# Patient Record
Sex: Male | Born: 1935 | Race: Black or African American | Hispanic: No | Marital: Married | Smoking: Former smoker
Health system: Southern US, Community
[De-identification: ages and names within clinical notes are randomized; demographics above are authoritative.]

## PROBLEM LIST (undated history)

## (undated) DIAGNOSIS — I639 Cerebral infarction, unspecified: Secondary | ICD-10-CM

## (undated) DIAGNOSIS — J9600 Acute respiratory failure, unspecified whether with hypoxia or hypercapnia: Secondary | ICD-10-CM

## (undated) DIAGNOSIS — N189 Chronic kidney disease, unspecified: Secondary | ICD-10-CM

## (undated) DIAGNOSIS — I509 Heart failure, unspecified: Secondary | ICD-10-CM

## (undated) DIAGNOSIS — I10 Essential (primary) hypertension: Secondary | ICD-10-CM

## (undated) DIAGNOSIS — E039 Hypothyroidism, unspecified: Secondary | ICD-10-CM

## (undated) DIAGNOSIS — I2510 Atherosclerotic heart disease of native coronary artery without angina pectoris: Secondary | ICD-10-CM

## (undated) DIAGNOSIS — E785 Hyperlipidemia, unspecified: Secondary | ICD-10-CM

## (undated) HISTORY — DX: Hyperlipidemia, unspecified: E78.5

## (undated) HISTORY — DX: Heart failure, unspecified: I50.9

## (undated) HISTORY — DX: Hypothyroidism, unspecified: E03.9

## (undated) HISTORY — DX: Cerebral infarction, unspecified: I63.9

## (undated) HISTORY — PX: THYROIDECTOMY: SHX17

## (undated) HISTORY — DX: Atherosclerotic heart disease of native coronary artery without angina pectoris: I25.10

## (undated) HISTORY — DX: Essential (primary) hypertension: I10

## (undated) HISTORY — DX: Chronic kidney disease, unspecified: N18.9

## (undated) HISTORY — DX: Acute respiratory failure, unspecified whether with hypoxia or hypercapnia: J96.00

---

## 1999-12-10 ENCOUNTER — Encounter: Payer: Self-pay | Admitting: Emergency Medicine

## 1999-12-10 ENCOUNTER — Emergency Department (HOSPITAL_COMMUNITY): Admission: EM | Admit: 1999-12-10 | Discharge: 1999-12-10 | Payer: Self-pay | Admitting: Emergency Medicine

## 2000-08-12 ENCOUNTER — Emergency Department (HOSPITAL_COMMUNITY): Admission: EM | Admit: 2000-08-12 | Discharge: 2000-08-12 | Payer: Self-pay | Admitting: Emergency Medicine

## 2002-11-20 ENCOUNTER — Encounter: Payer: Self-pay | Admitting: Internal Medicine

## 2002-11-20 ENCOUNTER — Inpatient Hospital Stay (HOSPITAL_COMMUNITY): Admission: EM | Admit: 2002-11-20 | Discharge: 2003-01-01 | Payer: Self-pay | Admitting: Emergency Medicine

## 2002-11-20 ENCOUNTER — Encounter: Payer: Self-pay | Admitting: Emergency Medicine

## 2002-11-21 ENCOUNTER — Encounter: Payer: Self-pay | Admitting: Internal Medicine

## 2002-11-21 ENCOUNTER — Encounter: Payer: Self-pay | Admitting: Critical Care Medicine

## 2002-11-22 ENCOUNTER — Encounter: Payer: Self-pay | Admitting: Critical Care Medicine

## 2002-11-24 ENCOUNTER — Encounter: Payer: Self-pay | Admitting: Cardiology

## 2002-11-26 ENCOUNTER — Encounter: Payer: Self-pay | Admitting: Pulmonary Disease

## 2002-12-08 ENCOUNTER — Encounter: Payer: Self-pay | Admitting: Pulmonary Disease

## 2002-12-22 ENCOUNTER — Encounter: Payer: Self-pay | Admitting: Pulmonary Disease

## 2003-01-01 ENCOUNTER — Inpatient Hospital Stay: Admission: RE | Admit: 2003-01-01 | Discharge: 2003-01-12 | Payer: Self-pay | Admitting: Internal Medicine

## 2003-01-12 ENCOUNTER — Inpatient Hospital Stay (HOSPITAL_COMMUNITY)
Admission: RE | Admit: 2003-01-12 | Discharge: 2003-01-23 | Payer: Self-pay | Admitting: Physical Medicine & Rehabilitation

## 2003-01-12 ENCOUNTER — Encounter: Payer: Self-pay | Admitting: Physical Medicine & Rehabilitation

## 2003-01-16 ENCOUNTER — Encounter: Payer: Self-pay | Admitting: Physical Medicine & Rehabilitation

## 2003-01-19 ENCOUNTER — Encounter: Payer: Self-pay | Admitting: Physical Medicine & Rehabilitation

## 2003-01-22 ENCOUNTER — Encounter: Payer: Self-pay | Admitting: Physical Medicine & Rehabilitation

## 2003-02-24 ENCOUNTER — Encounter: Payer: Self-pay | Admitting: Emergency Medicine

## 2003-02-24 ENCOUNTER — Inpatient Hospital Stay (HOSPITAL_COMMUNITY): Admission: EM | Admit: 2003-02-24 | Discharge: 2003-03-03 | Payer: Self-pay | Admitting: Emergency Medicine

## 2003-02-25 ENCOUNTER — Encounter: Payer: Self-pay | Admitting: Cardiology

## 2003-02-26 ENCOUNTER — Encounter: Payer: Self-pay | Admitting: Internal Medicine

## 2003-02-27 ENCOUNTER — Encounter: Payer: Self-pay | Admitting: Internal Medicine

## 2003-03-01 ENCOUNTER — Encounter: Payer: Self-pay | Admitting: Internal Medicine

## 2003-04-08 ENCOUNTER — Encounter: Admission: RE | Admit: 2003-04-08 | Discharge: 2003-05-04 | Payer: Self-pay | Admitting: Internal Medicine

## 2003-07-13 ENCOUNTER — Emergency Department (HOSPITAL_COMMUNITY): Admission: EM | Admit: 2003-07-13 | Discharge: 2003-07-13 | Payer: Self-pay | Admitting: Emergency Medicine

## 2004-01-03 ENCOUNTER — Inpatient Hospital Stay (HOSPITAL_COMMUNITY): Admission: EM | Admit: 2004-01-03 | Discharge: 2004-01-08 | Payer: Self-pay

## 2004-01-05 ENCOUNTER — Encounter: Payer: Self-pay | Admitting: Cardiology

## 2004-03-12 ENCOUNTER — Inpatient Hospital Stay (HOSPITAL_COMMUNITY): Admission: EM | Admit: 2004-03-12 | Discharge: 2004-03-13 | Payer: Self-pay

## 2004-04-17 ENCOUNTER — Emergency Department (HOSPITAL_COMMUNITY): Admission: EM | Admit: 2004-04-17 | Discharge: 2004-04-17 | Payer: Self-pay | Admitting: Emergency Medicine

## 2004-09-26 ENCOUNTER — Ambulatory Visit: Payer: Self-pay | Admitting: Family Medicine

## 2004-10-21 ENCOUNTER — Ambulatory Visit: Payer: Self-pay | Admitting: Internal Medicine

## 2004-10-28 ENCOUNTER — Ambulatory Visit: Payer: Self-pay | Admitting: Internal Medicine

## 2004-11-07 ENCOUNTER — Ambulatory Visit: Payer: Self-pay | Admitting: Internal Medicine

## 2004-11-22 ENCOUNTER — Ambulatory Visit: Payer: Self-pay | Admitting: Internal Medicine

## 2004-11-29 ENCOUNTER — Ambulatory Visit: Payer: Self-pay | Admitting: Internal Medicine

## 2005-01-13 ENCOUNTER — Ambulatory Visit: Payer: Self-pay | Admitting: Internal Medicine

## 2005-01-23 ENCOUNTER — Ambulatory Visit: Payer: Self-pay

## 2005-02-06 ENCOUNTER — Ambulatory Visit: Payer: Self-pay | Admitting: Internal Medicine

## 2005-02-20 ENCOUNTER — Ambulatory Visit: Payer: Self-pay | Admitting: Internal Medicine

## 2005-03-15 ENCOUNTER — Emergency Department (HOSPITAL_COMMUNITY): Admission: EM | Admit: 2005-03-15 | Discharge: 2005-03-15 | Payer: Self-pay | Admitting: Emergency Medicine

## 2005-03-23 ENCOUNTER — Ambulatory Visit: Payer: Self-pay | Admitting: Internal Medicine

## 2005-03-23 ENCOUNTER — Inpatient Hospital Stay (HOSPITAL_COMMUNITY): Admission: EM | Admit: 2005-03-23 | Discharge: 2005-03-27 | Payer: Self-pay | Admitting: *Deleted

## 2005-04-05 ENCOUNTER — Ambulatory Visit: Payer: Self-pay | Admitting: Internal Medicine

## 2005-04-11 ENCOUNTER — Ambulatory Visit: Payer: Self-pay | Admitting: Cardiology

## 2005-05-24 ENCOUNTER — Ambulatory Visit: Payer: Self-pay | Admitting: Internal Medicine

## 2005-08-24 ENCOUNTER — Ambulatory Visit: Payer: Self-pay | Admitting: Internal Medicine

## 2005-10-17 ENCOUNTER — Ambulatory Visit: Payer: Self-pay | Admitting: Cardiology

## 2005-10-31 ENCOUNTER — Ambulatory Visit: Payer: Self-pay | Admitting: Cardiology

## 2005-10-31 ENCOUNTER — Inpatient Hospital Stay (HOSPITAL_COMMUNITY): Admission: EM | Admit: 2005-10-31 | Discharge: 2005-11-02 | Payer: Self-pay | Admitting: Emergency Medicine

## 2005-11-02 ENCOUNTER — Encounter: Payer: Self-pay | Admitting: Cardiology

## 2005-11-10 ENCOUNTER — Ambulatory Visit: Payer: Self-pay

## 2005-11-23 ENCOUNTER — Ambulatory Visit: Payer: Self-pay | Admitting: Internal Medicine

## 2005-12-06 ENCOUNTER — Ambulatory Visit: Payer: Self-pay | Admitting: Cardiology

## 2005-12-29 ENCOUNTER — Ambulatory Visit: Payer: Self-pay

## 2006-01-15 ENCOUNTER — Ambulatory Visit: Payer: Self-pay | Admitting: Cardiology

## 2006-01-22 ENCOUNTER — Ambulatory Visit: Payer: Self-pay | Admitting: Internal Medicine

## 2006-02-21 ENCOUNTER — Ambulatory Visit: Payer: Self-pay | Admitting: Internal Medicine

## 2006-02-28 ENCOUNTER — Ambulatory Visit: Payer: Self-pay | Admitting: Cardiology

## 2006-03-14 ENCOUNTER — Ambulatory Visit: Payer: Self-pay | Admitting: Cardiology

## 2006-03-23 ENCOUNTER — Ambulatory Visit: Payer: Self-pay | Admitting: Internal Medicine

## 2006-04-11 ENCOUNTER — Ambulatory Visit: Payer: Self-pay | Admitting: Internal Medicine

## 2006-04-12 ENCOUNTER — Encounter (HOSPITAL_COMMUNITY): Admission: RE | Admit: 2006-04-12 | Discharge: 2006-07-11 | Payer: Self-pay | Admitting: Nephrology

## 2006-04-13 ENCOUNTER — Ambulatory Visit: Payer: Self-pay | Admitting: Internal Medicine

## 2006-04-16 ENCOUNTER — Ambulatory Visit: Payer: Self-pay | Admitting: Internal Medicine

## 2006-04-26 ENCOUNTER — Ambulatory Visit: Payer: Self-pay | Admitting: Cardiology

## 2006-05-02 ENCOUNTER — Ambulatory Visit: Payer: Self-pay | Admitting: Internal Medicine

## 2006-05-10 ENCOUNTER — Ambulatory Visit: Payer: Self-pay | Admitting: Cardiology

## 2006-05-23 ENCOUNTER — Ambulatory Visit: Payer: Self-pay | Admitting: Internal Medicine

## 2006-05-25 ENCOUNTER — Ambulatory Visit: Payer: Self-pay | Admitting: Cardiology

## 2006-06-05 ENCOUNTER — Encounter: Payer: Self-pay | Admitting: Internal Medicine

## 2006-06-12 ENCOUNTER — Ambulatory Visit: Payer: Self-pay | Admitting: Internal Medicine

## 2006-06-22 ENCOUNTER — Ambulatory Visit: Payer: Self-pay | Admitting: Cardiology

## 2006-07-20 ENCOUNTER — Encounter (HOSPITAL_COMMUNITY): Admission: RE | Admit: 2006-07-20 | Discharge: 2006-10-18 | Payer: Self-pay | Admitting: Nephrology

## 2006-08-13 ENCOUNTER — Ambulatory Visit: Payer: Self-pay | Admitting: Internal Medicine

## 2006-08-13 LAB — CONVERTED CEMR LAB
ALT: 17 units/L (ref 0–40)
Albumin: 4.1 g/dL (ref 3.5–5.2)
Bilirubin, Direct: 0.1 mg/dL (ref 0.0–0.3)
Total Bilirubin: 0.7 mg/dL (ref 0.3–1.2)

## 2006-08-23 ENCOUNTER — Encounter: Admission: RE | Admit: 2006-08-23 | Discharge: 2006-08-23 | Payer: Self-pay | Admitting: Internal Medicine

## 2006-09-04 ENCOUNTER — Ambulatory Visit: Payer: Self-pay | Admitting: Cardiology

## 2006-11-05 ENCOUNTER — Encounter (HOSPITAL_COMMUNITY): Admission: RE | Admit: 2006-11-05 | Discharge: 2007-02-03 | Payer: Self-pay | Admitting: Nephrology

## 2006-11-12 ENCOUNTER — Ambulatory Visit: Payer: Self-pay | Admitting: Internal Medicine

## 2006-11-12 LAB — CONVERTED CEMR LAB
Calcium: 9.8 mg/dL (ref 8.4–10.5)
Chloride: 108 meq/L (ref 96–112)
Eosinophils Relative: 5.8 % — ABNORMAL HIGH (ref 0.0–5.0)
GFR calc non Af Amer: 26 mL/min
Glucose, Bld: 131 mg/dL — ABNORMAL HIGH (ref 70–99)
MCV: 96.5 fL (ref 78.0–100.0)
Monocytes Absolute: 0.5 10*3/uL (ref 0.2–0.7)
Platelets: 172 10*3/uL (ref 150–400)
RBC: 4.08 M/uL — ABNORMAL LOW (ref 4.22–5.81)
Sodium: 142 meq/L (ref 135–145)
WBC: 4.6 10*3/uL (ref 4.5–10.5)

## 2006-12-14 ENCOUNTER — Ambulatory Visit: Payer: Self-pay | Admitting: Cardiology

## 2006-12-14 LAB — CONVERTED CEMR LAB
ALT: 21 units/L (ref 0–40)
AST: 24 units/L (ref 0–37)
Albumin: 3.6 g/dL (ref 3.5–5.2)
BUN: 40 mg/dL — ABNORMAL HIGH (ref 6–23)
Bilirubin, Direct: 0.1 mg/dL (ref 0.0–0.3)
Cholesterol: 152 mg/dL (ref 0–200)
Creatinine, Ser: 2.1 mg/dL — ABNORMAL HIGH (ref 0.4–1.5)
HDL: 56.6 mg/dL (ref >39.0)
Sodium: 135 meq/L (ref 135–145)
Total Bilirubin: 0.6 mg/dL (ref 0.3–1.2)
Total Protein: 7.2 g/dL (ref 6.0–8.3)
VLDL: 17 mg/dL (ref 0–40)

## 2007-01-21 ENCOUNTER — Encounter: Payer: Self-pay | Admitting: Emergency Medicine

## 2007-01-21 ENCOUNTER — Ambulatory Visit: Payer: Self-pay | Admitting: Cardiology

## 2007-01-22 ENCOUNTER — Inpatient Hospital Stay (HOSPITAL_COMMUNITY): Admission: AD | Admit: 2007-01-22 | Discharge: 2007-01-22 | Payer: Self-pay | Admitting: Cardiology

## 2007-01-24 ENCOUNTER — Ambulatory Visit: Payer: Self-pay

## 2007-02-11 ENCOUNTER — Ambulatory Visit: Payer: Self-pay | Admitting: Internal Medicine

## 2007-02-11 ENCOUNTER — Ambulatory Visit: Payer: Self-pay | Admitting: Cardiology

## 2007-02-11 LAB — CONVERTED CEMR LAB: Cholesterol: 173 mg/dL (ref 0–200)

## 2007-03-25 ENCOUNTER — Emergency Department (HOSPITAL_COMMUNITY): Admission: EM | Admit: 2007-03-25 | Discharge: 2007-03-25 | Payer: Self-pay | Admitting: Emergency Medicine

## 2007-04-09 DIAGNOSIS — E785 Hyperlipidemia, unspecified: Secondary | ICD-10-CM | POA: Insufficient documentation

## 2007-04-09 DIAGNOSIS — Z8679 Personal history of other diseases of the circulatory system: Secondary | ICD-10-CM | POA: Insufficient documentation

## 2007-04-09 DIAGNOSIS — E039 Hypothyroidism, unspecified: Secondary | ICD-10-CM

## 2007-04-09 DIAGNOSIS — M109 Gout, unspecified: Secondary | ICD-10-CM

## 2007-04-09 DIAGNOSIS — I10 Essential (primary) hypertension: Secondary | ICD-10-CM

## 2007-04-15 ENCOUNTER — Ambulatory Visit: Payer: Self-pay | Admitting: Internal Medicine

## 2007-04-15 LAB — CONVERTED CEMR LAB
BUN: 64 mg/dL — ABNORMAL HIGH (ref 6–23)
CO2: 31 meq/L (ref 19–32)
Calcium: 9.9 mg/dL (ref 8.4–10.5)
Chloride: 105 meq/L (ref 96–112)
Creatinine, Ser: 2.5 mg/dL — ABNORMAL HIGH (ref 0.4–1.5)
Glucose, Bld: 256 mg/dL — ABNORMAL HIGH (ref 70–99)

## 2007-05-23 ENCOUNTER — Encounter: Payer: Self-pay | Admitting: Internal Medicine

## 2007-06-06 ENCOUNTER — Ambulatory Visit: Payer: Self-pay | Admitting: Cardiovascular Disease

## 2007-06-11 ENCOUNTER — Ambulatory Visit: Payer: Self-pay | Admitting: Cardiovascular Disease

## 2007-06-11 ENCOUNTER — Observation Stay (HOSPITAL_COMMUNITY): Admission: AD | Admit: 2007-06-11 | Discharge: 2007-06-12 | Payer: Self-pay | Admitting: Cardiovascular Disease

## 2007-07-18 ENCOUNTER — Ambulatory Visit: Payer: Self-pay | Admitting: Internal Medicine

## 2007-07-22 ENCOUNTER — Encounter: Payer: Self-pay | Admitting: Internal Medicine

## 2007-07-31 ENCOUNTER — Ambulatory Visit: Payer: Self-pay | Admitting: Cardiology

## 2007-07-31 LAB — CONVERTED CEMR LAB
ALT: 14 units/L (ref 0–53)
AST: 18 units/L (ref 0–37)
Alkaline Phosphatase: 33 units/L — ABNORMAL LOW (ref 39–117)
BUN: 65 mg/dL — ABNORMAL HIGH (ref 6–23)
Calcium: 9.3 mg/dL (ref 8.4–10.5)
Cholesterol: 153 mg/dL (ref 0–200)
Creatinine, Ser: 2.8 mg/dL — ABNORMAL HIGH (ref 0.4–1.5)
GFR calc Af Amer: 29 mL/min
Glucose, Bld: 171 mg/dL — ABNORMAL HIGH (ref 70–99)
HDL: 45.7 mg/dL (ref >39.0)
LDL Cholesterol: 76 mg/dL (ref 0–99)
Potassium: 4.1 meq/L (ref 3.5–5.1)
Total CHOL/HDL Ratio: 3.3
Total Protein: 7.4 g/dL (ref 6.0–8.3)

## 2007-08-23 ENCOUNTER — Telehealth: Payer: Self-pay | Admitting: Internal Medicine

## 2007-09-09 ENCOUNTER — Ambulatory Visit: Payer: Self-pay | Admitting: Cardiovascular Disease

## 2007-09-18 ENCOUNTER — Encounter: Payer: Self-pay | Admitting: Internal Medicine

## 2007-09-19 ENCOUNTER — Inpatient Hospital Stay (HOSPITAL_COMMUNITY): Admission: EM | Admit: 2007-09-19 | Discharge: 2007-09-22 | Payer: Self-pay | Admitting: Emergency Medicine

## 2007-09-19 ENCOUNTER — Ambulatory Visit: Payer: Self-pay | Admitting: Cardiology

## 2007-09-19 ENCOUNTER — Ambulatory Visit: Payer: Self-pay | Admitting: Internal Medicine

## 2007-09-20 ENCOUNTER — Encounter: Payer: Self-pay | Admitting: Internal Medicine

## 2007-09-23 ENCOUNTER — Telehealth: Payer: Self-pay | Admitting: Internal Medicine

## 2007-09-25 ENCOUNTER — Ambulatory Visit: Payer: Self-pay | Admitting: Internal Medicine

## 2007-09-25 ENCOUNTER — Telehealth: Payer: Self-pay | Admitting: Internal Medicine

## 2007-09-25 DIAGNOSIS — I2510 Atherosclerotic heart disease of native coronary artery without angina pectoris: Secondary | ICD-10-CM | POA: Insufficient documentation

## 2007-09-25 DIAGNOSIS — E875 Hyperkalemia: Secondary | ICD-10-CM

## 2007-09-25 LAB — CONVERTED CEMR LAB
BUN: 51 mg/dL — ABNORMAL HIGH (ref 6–23)
CO2: 25 meq/L (ref 19–32)
GFR calc Af Amer: 32 mL/min
LDL Goal: 100 mg/dL
Potassium: 6.2 meq/L (ref 3.5–5.1)
Sodium: 143 meq/L (ref 135–145)
TSH: 10.08 microintl units/mL — ABNORMAL HIGH (ref 0.35–5.50)

## 2007-09-26 ENCOUNTER — Ambulatory Visit: Payer: Self-pay | Admitting: Cardiology

## 2007-09-26 LAB — CONVERTED CEMR LAB
AST: 12 units/L (ref 0–37)
Bilirubin, Direct: 0.1 mg/dL (ref 0.0–0.3)
HDL: 48.3 mg/dL (ref >39.0)
Total Bilirubin: 0.7 mg/dL (ref 0.3–1.2)
Total CHOL/HDL Ratio: 2.7
Triglycerides: 92 mg/dL (ref 0–149)
VLDL: 18 mg/dL (ref 0–40)

## 2007-09-27 ENCOUNTER — Ambulatory Visit: Payer: Self-pay | Admitting: Cardiology

## 2007-10-16 ENCOUNTER — Ambulatory Visit: Payer: Self-pay | Admitting: Cardiology

## 2007-10-31 ENCOUNTER — Ambulatory Visit: Payer: Self-pay | Admitting: Internal Medicine

## 2007-10-31 DIAGNOSIS — Z992 Dependence on renal dialysis: Secondary | ICD-10-CM

## 2007-10-31 DIAGNOSIS — E1129 Type 2 diabetes mellitus with other diabetic kidney complication: Secondary | ICD-10-CM

## 2007-10-31 DIAGNOSIS — N186 End stage renal disease: Secondary | ICD-10-CM

## 2007-10-31 LAB — CONVERTED CEMR LAB
BUN: 36 mg/dL — ABNORMAL HIGH (ref 6–23)
Creatinine, Ser: 1.9 mg/dL — ABNORMAL HIGH (ref 0.4–1.5)
GFR calc Af Amer: 45 mL/min
Hgb A1c MFr Bld: 7.4 % — ABNORMAL HIGH (ref 4.6–6.0)
Potassium: 4.2 meq/L (ref 3.5–5.1)

## 2007-11-26 ENCOUNTER — Encounter: Payer: Self-pay | Admitting: Internal Medicine

## 2008-01-13 ENCOUNTER — Telehealth: Payer: Self-pay | Admitting: Internal Medicine

## 2008-01-30 ENCOUNTER — Ambulatory Visit: Payer: Self-pay | Admitting: Internal Medicine

## 2008-02-07 ENCOUNTER — Encounter: Payer: Self-pay | Admitting: Internal Medicine

## 2008-03-07 ENCOUNTER — Emergency Department (HOSPITAL_COMMUNITY): Admission: EM | Admit: 2008-03-07 | Discharge: 2008-03-07 | Payer: Self-pay | Admitting: Emergency Medicine

## 2008-03-20 ENCOUNTER — Ambulatory Visit: Payer: Self-pay | Admitting: Internal Medicine

## 2008-03-20 ENCOUNTER — Telehealth: Payer: Self-pay | Admitting: Internal Medicine

## 2008-03-20 DIAGNOSIS — M549 Dorsalgia, unspecified: Secondary | ICD-10-CM | POA: Insufficient documentation

## 2008-03-24 ENCOUNTER — Telehealth: Payer: Self-pay | Admitting: Internal Medicine

## 2008-04-01 ENCOUNTER — Ambulatory Visit: Payer: Self-pay | Admitting: Cardiology

## 2008-04-10 ENCOUNTER — Encounter: Payer: Self-pay | Admitting: Internal Medicine

## 2008-04-30 ENCOUNTER — Ambulatory Visit: Payer: Self-pay | Admitting: Internal Medicine

## 2008-04-30 DIAGNOSIS — T887XXA Unspecified adverse effect of drug or medicament, initial encounter: Secondary | ICD-10-CM

## 2008-04-30 LAB — CONVERTED CEMR LAB
ALT: 17 units/L (ref 0–53)
AST: 18 units/L (ref 0–37)
Alkaline Phosphatase: 34 units/L — ABNORMAL LOW (ref 39–117)
Bilirubin, Direct: 0.1 mg/dL (ref 0.0–0.3)
Cholesterol: 141 mg/dL (ref 0–200)
Total Protein: 7.5 g/dL (ref 6.0–8.3)

## 2008-05-07 ENCOUNTER — Ambulatory Visit: Payer: Self-pay | Admitting: Internal Medicine

## 2008-07-11 ENCOUNTER — Emergency Department (HOSPITAL_COMMUNITY): Admission: EM | Admit: 2008-07-11 | Discharge: 2008-07-11 | Payer: Self-pay | Admitting: Emergency Medicine

## 2008-08-05 ENCOUNTER — Ambulatory Visit: Payer: Self-pay | Admitting: Internal Medicine

## 2008-08-05 LAB — CONVERTED CEMR LAB
ALT: 11 units/L (ref 0–53)
AST: 17 units/L (ref 0–37)
Bilirubin, Direct: 0.1 mg/dL (ref 0.0–0.3)
CO2: 29 meq/L (ref 19–32)
Calcium: 9.4 mg/dL (ref 8.4–10.5)
Chloride: 109 meq/L (ref 96–112)
Sodium: 147 meq/L — ABNORMAL HIGH (ref 135–145)
TSH: 0.6 microintl units/mL (ref 0.35–5.50)
Total Bilirubin: 0.6 mg/dL (ref 0.3–1.2)
Total CHOL/HDL Ratio: 2.9

## 2008-09-07 ENCOUNTER — Ambulatory Visit: Payer: Self-pay | Admitting: Internal Medicine

## 2008-09-07 DIAGNOSIS — I509 Heart failure, unspecified: Secondary | ICD-10-CM

## 2008-09-16 ENCOUNTER — Encounter: Payer: Self-pay | Admitting: Internal Medicine

## 2008-10-27 ENCOUNTER — Telehealth: Payer: Self-pay | Admitting: Internal Medicine

## 2008-11-09 ENCOUNTER — Ambulatory Visit: Payer: Self-pay | Admitting: Internal Medicine

## 2008-11-09 LAB — CONVERTED CEMR LAB
CO2: 32 meq/L (ref 19–32)
Chloride: 111 meq/L (ref 96–112)
Creatinine, Ser: 2.1 mg/dL — ABNORMAL HIGH (ref 0.4–1.5)
Hgb A1c MFr Bld: 7.1 % — ABNORMAL HIGH (ref 4.6–6.0)

## 2008-12-23 ENCOUNTER — Ambulatory Visit: Payer: Self-pay | Admitting: Cardiology

## 2009-01-01 ENCOUNTER — Encounter: Payer: Self-pay | Admitting: Internal Medicine

## 2009-02-03 ENCOUNTER — Ambulatory Visit: Payer: Self-pay | Admitting: Internal Medicine

## 2009-02-10 LAB — CONVERTED CEMR LAB
CO2: 29 meq/L (ref 19–32)
Calcium: 9.5 mg/dL (ref 8.4–10.5)
GFR calc non Af Amer: 45.14 mL/min (ref >60)
Pro B Natriuretic peptide (BNP): 527 pg/mL — ABNORMAL HIGH (ref 0.0–100.0)
Sodium: 149 meq/L — ABNORMAL HIGH (ref 135–145)
TSH: 0.31 microintl units/mL — ABNORMAL LOW (ref 0.35–5.50)

## 2009-03-24 ENCOUNTER — Encounter: Payer: Self-pay | Admitting: Internal Medicine

## 2009-05-05 ENCOUNTER — Ambulatory Visit: Payer: Self-pay | Admitting: Internal Medicine

## 2009-05-12 ENCOUNTER — Telehealth (INDEPENDENT_AMBULATORY_CARE_PROVIDER_SITE_OTHER): Payer: Self-pay | Admitting: *Deleted

## 2009-05-22 ENCOUNTER — Emergency Department (HOSPITAL_COMMUNITY): Admission: EM | Admit: 2009-05-22 | Discharge: 2009-05-22 | Payer: Self-pay | Admitting: Emergency Medicine

## 2009-05-31 ENCOUNTER — Telehealth: Payer: Self-pay | Admitting: Internal Medicine

## 2009-06-07 ENCOUNTER — Ambulatory Visit: Payer: Self-pay | Admitting: Internal Medicine

## 2009-06-07 LAB — CONVERTED CEMR LAB
GFR calc non Af Amer: 38.08 mL/min (ref >60)
Glucose, Bld: 199 mg/dL — ABNORMAL HIGH (ref 70–99)
Hgb A1c MFr Bld: 6.9 % — ABNORMAL HIGH (ref 4.6–6.5)
Potassium: 5.8 meq/L — ABNORMAL HIGH (ref 3.5–5.1)
Sodium: 144 meq/L (ref 135–145)
Uric Acid, Serum: 10 mg/dL — ABNORMAL HIGH (ref 4.0–7.8)

## 2009-06-24 ENCOUNTER — Encounter: Payer: Self-pay | Admitting: Internal Medicine

## 2009-06-28 ENCOUNTER — Telehealth: Payer: Self-pay | Admitting: Internal Medicine

## 2009-07-13 ENCOUNTER — Encounter (INDEPENDENT_AMBULATORY_CARE_PROVIDER_SITE_OTHER): Payer: Self-pay | Admitting: *Deleted

## 2009-09-06 ENCOUNTER — Ambulatory Visit: Payer: Self-pay | Admitting: Internal Medicine

## 2009-09-06 LAB — CONVERTED CEMR LAB
GFR calc non Af Amer: 45.06 mL/min (ref >60)
Hgb A1c MFr Bld: 6.7 % — ABNORMAL HIGH (ref 4.6–6.5)
Potassium: 3.9 meq/L (ref 3.5–5.1)
Sodium: 145 meq/L (ref 135–145)
TSH: 0.31 microintl units/mL — ABNORMAL LOW (ref 0.35–5.50)

## 2009-09-20 ENCOUNTER — Encounter (INDEPENDENT_AMBULATORY_CARE_PROVIDER_SITE_OTHER): Payer: Self-pay | Admitting: *Deleted

## 2009-09-27 ENCOUNTER — Ambulatory Visit: Payer: Self-pay | Admitting: Cardiology

## 2009-09-27 DIAGNOSIS — R072 Precordial pain: Secondary | ICD-10-CM | POA: Insufficient documentation

## 2009-09-28 ENCOUNTER — Telehealth (INDEPENDENT_AMBULATORY_CARE_PROVIDER_SITE_OTHER): Payer: Self-pay | Admitting: *Deleted

## 2009-09-29 ENCOUNTER — Encounter: Payer: Self-pay | Admitting: Cardiology

## 2009-09-29 ENCOUNTER — Encounter (HOSPITAL_COMMUNITY): Admission: RE | Admit: 2009-09-29 | Discharge: 2009-10-14 | Payer: Self-pay | Admitting: Cardiology

## 2009-09-29 ENCOUNTER — Ambulatory Visit: Payer: Self-pay

## 2009-09-29 ENCOUNTER — Ambulatory Visit: Payer: Self-pay | Admitting: Internal Medicine

## 2009-09-29 ENCOUNTER — Encounter (INDEPENDENT_AMBULATORY_CARE_PROVIDER_SITE_OTHER): Payer: Self-pay | Admitting: *Deleted

## 2009-09-29 LAB — CONVERTED CEMR LAB
AST: 14 units/L (ref 0–37)
Albumin: 3.7 g/dL (ref 3.5–5.2)
Alkaline Phosphatase: 48 units/L (ref 39–117)
Bilirubin, Direct: 0 mg/dL (ref 0.0–0.3)
Cholesterol: 145 mg/dL (ref 0–200)
Total Protein: 7.6 g/dL (ref 6.0–8.3)
Triglycerides: 113 mg/dL (ref 0.0–149.0)

## 2009-12-07 ENCOUNTER — Ambulatory Visit: Payer: Self-pay | Admitting: Internal Medicine

## 2010-01-04 ENCOUNTER — Ambulatory Visit: Payer: Self-pay | Admitting: Internal Medicine

## 2010-01-04 LAB — CONVERTED CEMR LAB
BUN: 45 mg/dL — ABNORMAL HIGH (ref 6–23)
CO2: 28 meq/L (ref 19–32)
Calcium: 9.5 mg/dL (ref 8.4–10.5)
Creatinine, Ser: 2 mg/dL — ABNORMAL HIGH (ref 0.4–1.5)
Hgb A1c MFr Bld: 7.4 % — ABNORMAL HIGH (ref 4.6–6.5)

## 2010-02-14 ENCOUNTER — Encounter: Payer: Self-pay | Admitting: Internal Medicine

## 2010-03-28 ENCOUNTER — Encounter: Payer: Self-pay | Admitting: Internal Medicine

## 2010-04-06 ENCOUNTER — Ambulatory Visit: Payer: Self-pay | Admitting: Internal Medicine

## 2010-04-06 DIAGNOSIS — R252 Cramp and spasm: Secondary | ICD-10-CM | POA: Insufficient documentation

## 2010-04-06 DIAGNOSIS — M1990 Unspecified osteoarthritis, unspecified site: Secondary | ICD-10-CM

## 2010-04-06 LAB — CONVERTED CEMR LAB
Calcium: 9.1 mg/dL (ref 8.4–10.5)
Creatinine, Ser: 2.4 mg/dL — ABNORMAL HIGH (ref 0.4–1.5)
GFR calc non Af Amer: 34.53 mL/min (ref >60)
Glucose, Bld: 96 mg/dL (ref 70–99)
Sodium: 146 meq/L — ABNORMAL HIGH (ref 135–145)

## 2010-04-13 ENCOUNTER — Ambulatory Visit: Payer: Self-pay | Admitting: Internal Medicine

## 2010-04-20 ENCOUNTER — Ambulatory Visit: Payer: Self-pay | Admitting: Internal Medicine

## 2010-05-05 ENCOUNTER — Ambulatory Visit: Payer: Self-pay | Admitting: Internal Medicine

## 2010-05-05 LAB — CONVERTED CEMR LAB
ALT: 17 units/L (ref 0–53)
Albumin: 4.3 g/dL (ref 3.5–5.2)
Basophils Relative: 0.6 % (ref 0.0–3.0)
Bilirubin, Direct: 0.1 mg/dL (ref 0.0–0.3)
CO2: 28 meq/L (ref 19–32)
Chloride: 112 meq/L (ref 96–112)
Cholesterol: 127 mg/dL (ref 0–200)
Creatinine, Ser: 2.6 mg/dL — ABNORMAL HIGH (ref 0.4–1.5)
Direct LDL: 52 mg/dL
Eosinophils Absolute: 0.2 10*3/uL (ref 0.0–0.7)
Eosinophils Relative: 5.2 % — ABNORMAL HIGH (ref 0.0–5.0)
HCT: 38.9 % — ABNORMAL LOW (ref 39.0–52.0)
Hemoglobin: 13.3 g/dL (ref 13.0–17.0)
Lymphs Abs: 1.1 10*3/uL (ref 0.7–4.0)
MCHC: 34.1 g/dL (ref 30.0–36.0)
MCV: 93.4 fL (ref 78.0–100.0)
Monocytes Absolute: 0.7 10*3/uL (ref 0.1–1.0)
Neutro Abs: 2.4 10*3/uL (ref 1.4–7.7)
Neutrophils Relative %: 55 % (ref 43.0–77.0)
Potassium: 5.7 meq/L — ABNORMAL HIGH (ref 3.5–5.1)
RBC: 4.17 M/uL — ABNORMAL LOW (ref 4.22–5.81)
Total CHOL/HDL Ratio: 3
Total Protein: 7.5 g/dL (ref 6.0–8.3)
VLDL: 46.6 mg/dL — ABNORMAL HIGH (ref 0.0–40.0)
WBC: 4.4 10*3/uL — ABNORMAL LOW (ref 4.5–10.5)

## 2010-06-30 ENCOUNTER — Encounter: Payer: Self-pay | Admitting: Internal Medicine

## 2010-07-20 ENCOUNTER — Encounter: Payer: Self-pay | Admitting: Internal Medicine

## 2010-08-15 ENCOUNTER — Ambulatory Visit: Payer: Self-pay | Admitting: Internal Medicine

## 2010-08-15 LAB — CONVERTED CEMR LAB
CO2: 27 meq/L (ref 19–32)
Calcium: 8.6 mg/dL (ref 8.4–10.5)
Creatinine, Ser: 2 mg/dL — ABNORMAL HIGH (ref 0.4–1.5)
Direct LDL: 50.3 mg/dL
GFR calc non Af Amer: 41.41 mL/min (ref >60)
HDL: 44.8 mg/dL (ref >39.00)
Hgb A1c MFr Bld: 7.8 % — ABNORMAL HIGH (ref 4.6–6.5)
Sodium: 144 meq/L (ref 135–145)

## 2010-09-26 ENCOUNTER — Encounter: Payer: Self-pay | Admitting: Cardiology

## 2010-09-27 ENCOUNTER — Ambulatory Visit: Payer: Self-pay | Admitting: Cardiology

## 2010-09-28 ENCOUNTER — Encounter: Payer: Self-pay | Admitting: Internal Medicine

## 2010-10-28 ENCOUNTER — Other Ambulatory Visit: Payer: Self-pay | Admitting: Cardiology

## 2010-10-28 ENCOUNTER — Ambulatory Visit: Admission: RE | Admit: 2010-10-28 | Discharge: 2010-10-28 | Payer: Self-pay | Source: Home / Self Care

## 2010-10-28 LAB — LIPID PANEL
Cholesterol: 124 mg/dL (ref 0–200)
HDL: 44.2 mg/dL (ref >39.00)
LDL Cholesterol: 56 mg/dL (ref 0–99)
Total CHOL/HDL Ratio: 3
Triglycerides: 117 mg/dL (ref 0.0–149.0)
VLDL: 23.4 mg/dL (ref 0.0–40.0)

## 2010-10-28 LAB — HEPATIC FUNCTION PANEL
ALT: 14 U/L (ref 0–53)
AST: 18 U/L (ref 0–37)
Albumin: 3.4 g/dL — ABNORMAL LOW (ref 3.5–5.2)
Alkaline Phosphatase: 45 U/L (ref 39–117)
Bilirubin, Direct: 0.1 mg/dL (ref 0.0–0.3)
Total Bilirubin: 0.5 mg/dL (ref 0.3–1.2)
Total Protein: 6.6 g/dL (ref 6.0–8.3)

## 2010-10-31 ENCOUNTER — Encounter (INDEPENDENT_AMBULATORY_CARE_PROVIDER_SITE_OTHER): Payer: Self-pay | Admitting: *Deleted

## 2010-11-14 ENCOUNTER — Other Ambulatory Visit: Payer: Self-pay | Admitting: Internal Medicine

## 2010-11-14 ENCOUNTER — Ambulatory Visit
Admission: RE | Admit: 2010-11-14 | Discharge: 2010-11-14 | Payer: Self-pay | Source: Home / Self Care | Attending: Internal Medicine | Admitting: Internal Medicine

## 2010-11-14 LAB — URIC ACID: Uric Acid, Serum: 10.8 mg/dL — ABNORMAL HIGH (ref 4.0–7.8)

## 2010-11-14 LAB — BASIC METABOLIC PANEL
BUN: 51 mg/dL — ABNORMAL HIGH (ref 6–23)
Chloride: 106 mEq/L (ref 96–112)
Glucose, Bld: 168 mg/dL — ABNORMAL HIGH (ref 70–99)
Potassium: 4.7 mEq/L (ref 3.5–5.1)

## 2010-11-14 LAB — HEMOGLOBIN A1C: Hgb A1c MFr Bld: 8.3 % — ABNORMAL HIGH (ref 4.6–6.5)

## 2010-11-17 NOTE — Assessment & Plan Note (Signed)
Summary: 8:30 OV/RESCH FROM FRIDAY FOR M D MEETING/BMW   Vital Signs:  Patient profile:   76 year old male Height:      69 inches Weight:      206 pounds BMI:     30.53 Temp:     98.3 degrees F oral Pulse rate:   68 / minute Resp:     14 per minute BP sitting:   145 / 82  (left arm)  Vitals Entered By: Willy Eddy, LPN (August 15, 2010 8:29 AM) CC: roa, Hypertension Management Is Patient Diabetic? No   Primary Care Cordie Beazley:  Stacie Glaze MD  CC:  roa and Hypertension Management.  History of Present Illness: the pt has noted slight improvement in his renal dz and has been offered participation in a trila drug to slow progression of renal dz got the flu shot today No chest pains, or head aches DM has been in range on a potassium reducing diet creat was 2.8 blood sugars high   Hypertension History:      He denies headache, chest pain, palpitations, dyspnea with exertion, orthopnea, PND, peripheral edema, visual symptoms, neurologic problems, syncope, and side effects from treatment.  gets nervous at the doctors office.        Positive major cardiovascular risk factors include male age 27 years old or older, diabetes, hyperlipidemia, and hypertension.  Negative major cardiovascular risk factors include negative family history for ischemic heart disease and non-tobacco-user status.        Positive history for target organ damage include ASHD (either angina/prior MI/prior CABG), cardiac end organ damage (either CHF or LVH), prior stroke (or TIA), and renal insufficiency.  Further assessment for target organ damage reveals no history of peripheral vascular disease.     Preventive Screening-Counseling & Management  Alcohol-Tobacco     Smoking Status: quit     Packs/Day: 0.5     Year Started: 1957     Year Quit: 1967     Passive Smoke Exposure: no  Problems Prior to Update: 1)  Loc Osteoarthros Not Spec Prim/sec Lower Leg  (ICD-715.36) 2)  Leg Cramps, Idiopathic   (ICD-729.82) 3)  Precordial Pain  (ICD-786.51) 4)  Coronary Artery Disease  (ICD-414.00) 5)  Hypertension  (ICD-401.9) 6)  Hyperlipidemia  (ICD-272.4) 7)  Cerebrovascular Accident, Hx of  (ICD-V12.50) 8)  Congestive Heart Failure  (ICD-428.0) 9)  Uns Advrs Eff Uns Rx Medicinal&biological Sbstnc  (ICD-995.20) 10)  Back Pain  (ICD-724.5) 11)  Diabetes Mellitus, Type II  (ICD-250.00) 12)  Chronic Kidney Disease Stage II (MILD)  (ICD-585.2) 13)  Hyperkalemia  (ICD-276.7) 14)  Family History Diabetes 1st Degree Relative  (ICD-V18.0) 15)  Hypothyroidism  (ICD-244.9) 16)  Gout  (ICD-274.9) 17)  Hypercholesterolemia  (ICD-272.0)  Current Problems (verified): 1)  Loc Osteoarthros Not Spec Prim/sec Lower Leg  (ICD-715.36) 2)  Leg Cramps, Idiopathic  (ICD-729.82) 3)  Precordial Pain  (ICD-786.51) 4)  Coronary Artery Disease  (ICD-414.00) 5)  Hypertension  (ICD-401.9) 6)  Hyperlipidemia  (ICD-272.4) 7)  Cerebrovascular Accident, Hx of  (ICD-V12.50) 8)  Congestive Heart Failure  (ICD-428.0) 9)  Uns Advrs Eff Uns Rx Medicinal&biological Sbstnc  (ICD-995.20) 10)  Back Pain  (ICD-724.5) 11)  Diabetes Mellitus, Type II  (ICD-250.00) 12)  Chronic Kidney Disease Stage II (MILD)  (ICD-585.2) 13)  Hyperkalemia  (ICD-276.7) 14)  Family History Diabetes 1st Degree Relative  (ICD-V18.0) 15)  Hypothyroidism  (ICD-244.9) 16)  Gout  (ICD-274.9) 17)  Hypercholesterolemia  (ICD-272.0)  Medications  Prior to Update: 1)  Lasix 80 Mg  Tabs (Furosemide) .... Once Daily and 1/2 in Pm As Needed 2)  Aspirin 325 Mg  Tabs (Aspirin) .... Once Daily 3)  Synthroid 200 Mcg  Tabs (Levothyroxine Sodium) .... Onew By Mouth Daily 4)  Glyburide 5 Mg  Tabs (Glyburide) .Marland Kitchen.. 1 Two Times A Day 5)  Zocor 80 Mg  Tabs (Simvastatin) .... One By Mouth Daily 6)  Azor 10-40 Mg Tabs (Amlodipine-Olmesartan) .... One By Mouth Daily 7)  Calcitriol 0.25 Mcg  Caps (Calcitriol) .... 2 On Even Days and 1 On Odd Days 8)  Imdur 60 Mg  Xr24h-Tab (Isosorbide Mononitrate) .... One  Tab By Mouth Once Daily 9)  Colcrys 0.6 Mg Tabs (Colchicine) .Marland Kitchen.. 1 Two Times A Day As Needed Gout 10)  Bystolic 10 Mg Tabs (Nebivolol Hcl) .... One By Mouth Daily 11)  Chlorzoxazone 500 Mg Tabs (Chlorzoxazone) .... One By Mouth Two Times A Day As Needed Muscle Spasm 12)  Nitrostat 0.4 Mg Subl (Nitroglycerin) .... Use As Directed 13)  Hydrocodone-Acetaminophen 5-500 Mg Tabs (Hydrocodone-Acetaminophen) .... One By Mouth Q 6 Hours As Needed Gout Pain 14)  Patanol 0.1 % Soln (Olopatadine Hcl) .... One Drop in Each Eye Two Times A Day  Current Medications (verified): 1)  Lasix 80 Mg  Tabs (Furosemide) .... Once Daily and 1/2 in Pm As Needed 2)  Aspirin 325 Mg  Tabs (Aspirin) .... Once Daily 3)  Synthroid 200 Mcg  Tabs (Levothyroxine Sodium) .... Onew By Mouth Daily 4)  Glyburide 5 Mg  Tabs (Glyburide) .Marland Kitchen.. 1 Two Times A Day 5)  Zocor 80 Mg  Tabs (Simvastatin) .... One By Mouth Daily 6)  Azor 10-40 Mg Tabs (Amlodipine-Olmesartan) .... One By Mouth Daily 7)  Calcitriol 0.25 Mcg  Caps (Calcitriol) .... 2 On Even Days and 1 On Odd Days 8)  Imdur 60 Mg Xr24h-Tab (Isosorbide Mononitrate) .... One  Tab By Mouth Once Daily 9)  Colcrys 0.6 Mg Tabs (Colchicine) .Marland Kitchen.. 1 Two Times A Day As Needed Gout 10)  Bystolic 10 Mg Tabs (Nebivolol Hcl) .... One By Mouth Daily 11)  Chlorzoxazone 500 Mg Tabs (Chlorzoxazone) .... One By Mouth Two Times A Day As Needed Muscle Spasm 12)  Nitrostat 0.4 Mg Subl (Nitroglycerin) .... Use As Directed 13)  Hydrocodone-Acetaminophen 5-500 Mg Tabs (Hydrocodone-Acetaminophen) .... One By Mouth Q 6 Hours As Needed Gout Pain 14)  Patanol 0.1 % Soln (Olopatadine Hcl) .... One Drop in Each Eye Two Times A Day  Allergies (verified): No Known Drug Allergies  Past History:  Family History: Last updated: 10/31/2007 Family History Lung   father Family History Diabetes 1st degree relative  mother mother with renal failure  Social  History: Last updated: 07/18/2007 Retired Married Former Smoker  Risk Factors: Smoking Status: quit (08/15/2010) Packs/Day: 0.5 (08/15/2010) Passive Smoke Exposure: no (08/15/2010)  Past medical, surgical, family and social histories (including risk factors) reviewed, and no changes noted (except as noted below).  Past Medical History: Reviewed history from 09/27/2009 and no changes required. Current Problems:  CORONARY ARTERY DISEASE (ICD-414.00) ( followed by [facility]) HYPERTENSION (ICD-401.9) HYPERLIPIDEMIA (ICD-272.4) CEREBROVASCULAR ACCIDENT, HX OF (ICD-V12.50) CONGESTIVE HEART FAILURE (ICD-428.0) DIABETES MELLITUS, TYPE II (ICD-250.00) CHRONIC KIDNEY DISEASE STAGE II (MILD) (ICD-585.2) HYPOTHYROIDISM (ICD-244.9) GOUT (ICD-274.9)  Past Surgical History: Reviewed history from 09/27/2009 and no changes required. Thyroidectomy  CO2 aortogram, selective renal angiography.      Family History: Reviewed history from 10/31/2007 and no changes required. Family History Lung   father  Family History Diabetes 1st degree relative  mother mother with renal failure  Social History: Reviewed history from 07/18/2007 and no changes required. Retired Married Former Smoker  Review of Systems  The patient denies anorexia, fever, weight loss, weight gain, vision loss, decreased hearing, hoarseness, chest pain, syncope, dyspnea on exertion, peripheral edema, prolonged cough, headaches, hemoptysis, abdominal pain, melena, hematochezia, severe indigestion/heartburn, hematuria, incontinence, genital sores, muscle weakness, suspicious skin lesions, transient blindness, difficulty walking, depression, unusual weight change, abnormal bleeding, enlarged lymph nodes, angioedema, breast masses, and testicular masses.         Flu Vaccine Consent Questions     Do you have a history of severe allergic reactions to this vaccine? no    Any prior history of allergic reactions to egg and/or gelatin?  no    Do you have a sensitivity to the preservative Thimersol? no    Do you have a past history of Guillan-Barre Syndrome? no    Do you currently have an acute febrile illness? no    Have you ever had a severe reaction to latex? no    Vaccine information given and explained to patient? yes    Are you currently pregnant? no    Lot Number:[id number]   Exp Date:04/15/2011   Site Given  Left Deltoid IM   Physical Exam  General:  Well-developed,well-nourished,in no acute distress; alert,appropriate and cooperative throughout examination Head:  normocephalic and atraumatic.   Eyes:  pupils equal and pupils round.   Ears:  R ear normal and L ear normal.   Nose:  no external deformity and no nasal discharge.   Mouth:  pharynx pink and moist and no erythema.   Neck:  supple and full ROM.   Lungs:  normal respiratory effort and no wheezes.   Heart:  normal rate, regular rhythm, and Grade 2  /6 systolic ejection murmur.   Abdomen:  soft, normal bowel sounds, and distended.   Msk:  no joint tenderness, no joint swelling, no joint warmth, and decreased ROM.   Extremities:  trace left pedal edema and trace right pedal edema.   Neurologic:  alert & oriented X3.     Impression & Recommendations:  Problem # 1:  DIABETES MELLITUS, TYPE II (ICD-250.00)  His updated medication list for this problem includes:    Aspirin 325 Mg Tabs (Aspirin) ..... Once daily    Glyburide 5 Mg Tabs (Glyburide) .Marland Kitchen... 1 two times a day    Azor 10-40 Mg Tabs (Amlodipine-olmesartan) ..... One by mouth daily  Labs Reviewed: Creat: 2.6 (05/05/2010)    Reviewed HgBA1c results: 8.0 (04/06/2010)  7.4 (01/04/2010)  Orders: Venipuncture (63016) TLB-A1C / Hgb A1C (Glycohemoglobin) (83036-A1C)  Problem # 2:  HYPERTENSION (ICD-401.9)  His updated medication list for this problem includes:    Lasix 80 Mg Tabs (Furosemide) ..... Once daily and 1/2 in pm as needed    Azor 10-40 Mg Tabs (Amlodipine-olmesartan) ..... One by  mouth daily    Bystolic 10 Mg Tabs (Nebivolol hcl) ..... One by mouth daily  Orders: TLB-BMP (Basic Metabolic Panel-BMET) (80048-METABOL)  BP today: 145/82 Prior BP: 140/70 (05/05/2010)  Prior 10 Yr Risk Heart Disease: N/A (09/25/2007)  Labs Reviewed: K+: 5.7 (05/05/2010) Creat: : 2.6 (05/05/2010)   Chol: 127 (05/05/2010)   HDL: 45.30 (05/05/2010)   LDL: 65 (09/27/2009)   TG: 233.0 (05/05/2010)  Problem # 3:  HYPOTHYROIDISM (ICD-244.9)  His updated medication list for this problem includes:    Synthroid 200 Mcg Tabs (Levothyroxine sodium) ..... Onew  by mouth daily  Orders: TLB-TSH (Thyroid Stimulating Hormone) (84443-TSH)  Labs Reviewed: TSH: 0.31 (09/06/2009)    HgBA1c: 8.0 (04/06/2010) Chol: 127 (05/05/2010)   HDL: 45.30 (05/05/2010)   LDL: 65 (09/27/2009)   TG: 233.0 (05/05/2010)  Problem # 4:  HYPERCHOLESTEROLEMIA (ICD-272.0)  His updated medication list for this problem includes:    Zocor 80 Mg Tabs (Simvastatin) ..... One by mouth daily  Orders: TLB-Cholesterol, HDL (83718-HDL) TLB-Cholesterol, Direct LDL (83721-DIRLDL) TLB-Cholesterol, Total (82465-CHO)  Complete Medication List: 1)  Lasix 80 Mg Tabs (Furosemide) .... Once daily and 1/2 in pm as needed 2)  Aspirin 325 Mg Tabs (Aspirin) .... Once daily 3)  Synthroid 200 Mcg Tabs (Levothyroxine sodium) .... Onew by mouth daily 4)  Glyburide 5 Mg Tabs (Glyburide) .Marland Kitchen.. 1 two times a day 5)  Zocor 80 Mg Tabs (Simvastatin) .... One by mouth daily 6)  Azor 10-40 Mg Tabs (Amlodipine-olmesartan) .... One by mouth daily 7)  Calcitriol 0.25 Mcg Caps (Calcitriol) .... 2 on even days and 1 on odd days 8)  Imdur 60 Mg Xr24h-tab (Isosorbide mononitrate) .... One  tab by mouth once daily 9)  Colcrys 0.6 Mg Tabs (Colchicine) .Marland Kitchen.. 1 two times a day as needed gout 10)  Bystolic 10 Mg Tabs (Nebivolol hcl) .... One by mouth daily 11)  Chlorzoxazone 500 Mg Tabs (Chlorzoxazone) .... One by mouth two times a day as needed muscle  spasm 12)  Nitrostat 0.4 Mg Subl (Nitroglycerin) .... Use as directed 13)  Hydrocodone-acetaminophen 5-500 Mg Tabs (Hydrocodone-acetaminophen) .... One by mouth q 6 hours as needed gout pain 14)  Patanol 0.1 % Soln (Olopatadine hcl) .... One drop in each eye two times a day  Other Orders: Flu Vaccine 26yrs + MEDICARE PATIENTS (E4540) Administration Flu vaccine - MCR (J8119)  Hypertension Assessment/Plan:      The patient's hypertensive risk group is category C: Target organ damage and/or diabetes.  Today's blood pressure is 145/82.  His blood pressure goal is < 130/80.  Patient Instructions: 1)  Please schedule a follow-up appointment in 3 months. Prescriptions: HYDROCODONE-ACETAMINOPHEN 5-500 MG TABS (HYDROCODONE-ACETAMINOPHEN) one by mouth q 6 hours as needed gout pain  #30 x 2   Entered and Authorized by:   Stacie Glaze MD   Signed by:   Stacie Glaze MD on 08/15/2010   Method used:   Print then Give to Patient   RxID:   [id number] GLYBURIDE 5 MG  TABS (GLYBURIDE) 1 two times a day  #60 x 11   Entered and Authorized by:   Stacie Glaze MD   Signed by:   Stacie Glaze MD on 08/15/2010   Method used:   Electronically to        [facility]  [address] [phone]* (retail)       [address]       [place], [place]  [address]       Ph: [phone] or [phone]       Fax: [phone]   RxID:   [id number] HYDROCODONE-ACETAMINOPHEN 5-500 MG TABS (HYDROCODONE-ACETAMINOPHEN) one by mouth q 6 hours as needed gout pain  #30 x 3   Entered and Authorized by:   Stacie Glaze MD   Signed by:   Stacie Glaze MD on 08/15/2010   Method used:   Print then Give to Patient   RxID:   [id number]    Orders Added: 1)  Flu Vaccine 28yrs + MEDICARE PATIENTS [Q2039] 2)  Administration Flu vaccine -  MCR [G0008] 3)  Venipuncture [36415] 4)  TLB-A1C / Hgb A1C (Glycohemoglobin) [83036-A1C] 5)  TLB-BMP (Basic Metabolic Panel-BMET) [80048-METABOL] 6)  TLB-TSH (Thyroid Stimulating  Hormone) [84443-TSH] 7)  TLB-Cholesterol, HDL [83718-HDL] 8)  TLB-Cholesterol, Direct LDL [83721-DIRLDL] 9)  TLB-Cholesterol, Total [82465-CHO] 10)  Est. Patient Level IV [21308]

## 2010-11-17 NOTE — Assessment & Plan Note (Signed)
Summary: roa appt at 8:30/ moved from 11;30 bmw   Vital Signs:  Patient profile:   75 year old male Height:      69 inches Weight:      200 pounds BMI:     29.64 Temp:     98.2 degrees F oral Pulse rate:   72 / minute Resp:     14 per minute BP sitting:   150 / 78  (left arm)  Vitals Entered By: Willy Eddy, LPN (April 06, 2010 8:08 AM) CC: roa   CC:  roa.  History of Present Illness: has been using salt for cramps the cramps occur at rest and not with activity pt has sever knee pain and  has been o the orthopedist who suggested synvisc injectons DM has been fairly controlled and he has lab monitering sdue today his htn has fair control despite the added salt he reports no leg swelling or adnormal SOB   Preventive Screening-Counseling & Management  Alcohol-Tobacco     Smoking Status: quit     Packs/Day: 0.5     Year Started: 1957     Year Quit: 1967     Passive Smoke Exposure: no  Problems Prior to Update: 1)  Precordial Pain  (ICD-786.51) 2)  Coronary Artery Disease  (ICD-414.00) 3)  Hypertension  (ICD-401.9) 4)  Hyperlipidemia  (ICD-272.4) 5)  Cerebrovascular Accident, Hx of  (ICD-V12.50) 6)  Congestive Heart Failure  (ICD-428.0) 7)  Uns Advrs Eff Uns Rx Medicinal&biological Sbstnc  (ICD-995.20) 8)  Back Pain  (ICD-724.5) 9)  Diabetes Mellitus, Type II  (ICD-250.00) 10)  Chronic Kidney Disease Stage II (MILD)  (ICD-585.2) 11)  Hyperkalemia  (ICD-276.7) 12)  Family History Diabetes 1st Degree Relative  (ICD-V18.0) 13)  Hypothyroidism  (ICD-244.9) 14)  Gout  (ICD-274.9) 15)  Hypercholesterolemia  (ICD-272.0)  Current Problems (verified): 1)  Precordial Pain  (ICD-786.51) 2)  Coronary Artery Disease  (ICD-414.00) 3)  Hypertension  (ICD-401.9) 4)  Hyperlipidemia  (ICD-272.4) 5)  Cerebrovascular Accident, Hx of  (ICD-V12.50) 6)  Congestive Heart Failure  (ICD-428.0) 7)  Uns Advrs Eff Uns Rx Medicinal&biological Sbstnc  (ICD-995.20) 8)  Back Pain   (ICD-724.5) 9)  Diabetes Mellitus, Type II  (ICD-250.00) 10)  Chronic Kidney Disease Stage II (MILD)  (ICD-585.2) 11)  Hyperkalemia  (ICD-276.7) 12)  Family History Diabetes 1st Degree Relative  (ICD-V18.0) 13)  Hypothyroidism  (ICD-244.9) 14)  Gout  (ICD-274.9) 15)  Hypercholesterolemia  (ICD-272.0)  Medications Prior to Update: 1)  Lasix 80 Mg  Tabs (Furosemide) .... Once Daily and 1/2 in Pm As Needed 2)  Aspirin 325 Mg  Tabs (Aspirin) .... Once Daily 3)  Synthroid 200 Mcg  Tabs (Levothyroxine Sodium) .... Onew By Mouth Daily 4)  Glyburide 5 Mg  Tabs (Glyburide) .Marland Kitchen.. 1 Two Times A Day 5)  Zocor 80 Mg  Tabs (Simvastatin) .... One By Mouth Daily 6)  Azor 10-40 Mg Tabs (Amlodipine-Olmesartan) .... One By Mouth Daily 7)  Calcitriol 0.25 Mcg  Caps (Calcitriol) .... 2 On Even Days and 1 On Odd Days 8)  Imdur 60 Mg Xr24h-Tab (Isosorbide Mononitrate) .... One  Tab By Mouth Once Daily 9)  Colchicine 0.6 Mg Tabs (Colchicine) .... One By Two Times A Day As Needed Gout 10)  Bystolic 10 Mg Tabs (Nebivolol Hcl) .... One By Mouth Daily 11)  Parafon Forte Dsc 500 Mg Tabs (Chlorzoxazone) .Marland Kitchen.. 1 Three Times A Day As Needed 12)  Tramadol Hcl 50 Mg Tabs (Tramadol Hcl) .Marland KitchenMarland KitchenMarland Kitchen 1  Three Times A Day As Needed Pain 13)  Nitrostat 0.4 Mg Subl (Nitroglycerin) .... Use As Directed 14)  Hydrocodone-Acetaminophen 5-500 Mg Tabs (Hydrocodone-Acetaminophen) .... One By Mouth Q 6 Hours As Needed Gout Pain 15)  Patanol 0.1 % Soln (Olopatadine Hcl) .... One Drop in Each Eye Two Times A Day  Current Medications (verified): 1)  Lasix 80 Mg  Tabs (Furosemide) .... Once Daily and 1/2 in Pm As Needed 2)  Aspirin 325 Mg  Tabs (Aspirin) .... Once Daily 3)  Synthroid 200 Mcg  Tabs (Levothyroxine Sodium) .... Onew By Mouth Daily 4)  Glyburide 5 Mg  Tabs (Glyburide) .Marland Kitchen.. 1 Two Times A Day 5)  Zocor 80 Mg  Tabs (Simvastatin) .... One By Mouth Daily 6)  Azor 10-40 Mg Tabs (Amlodipine-Olmesartan) .... One By Mouth Daily 7)   Calcitriol 0.25 Mcg  Caps (Calcitriol) .... 2 On Even Days and 1 On Odd Days 8)  Imdur 60 Mg Xr24h-Tab (Isosorbide Mononitrate) .... One  Tab By Mouth Once Daily 9)  Colcrys 0.6 Mg Tabs (Colchicine) .Marland Kitchen.. 1 Two Times A Day As Needed Gout 10)  Bystolic 10 Mg Tabs (Nebivolol Hcl) .... One By Mouth Daily 11)  Chlorzoxazone 500 Mg Tabs (Chlorzoxazone) .... One By Mouth Two Times A Day As Needed Muscle Spasm 12)  Nitrostat 0.4 Mg Subl (Nitroglycerin) .... Use As Directed 13)  Hydrocodone-Acetaminophen 5-500 Mg Tabs (Hydrocodone-Acetaminophen) .... One By Mouth Q 6 Hours As Needed Gout Pain 14)  Patanol 0.1 % Soln (Olopatadine Hcl) .... One Drop in Each Eye Two Times A Day  Allergies (verified): No Known Drug Allergies  Past History:  Family History: Last updated: 10/31/2007 Family History Lung   father Family History Diabetes 1st degree relative  mother mother with renal failure  Social History: Last updated: 07/18/2007 Retired Married Former Smoker  Risk Factors: Smoking Status: quit (04/06/2010) Packs/Day: 0.5 (04/06/2010) Passive Smoke Exposure: no (04/06/2010)  Past medical, surgical, family and social histories (including risk factors) reviewed, and no changes noted (except as noted below).  Past Medical History: Reviewed history from 09/27/2009 and no changes required. Current Problems:  CORONARY ARTERY DISEASE (ICD-414.00) ( followed by [facility]) HYPERTENSION (ICD-401.9) HYPERLIPIDEMIA (ICD-272.4) CEREBROVASCULAR ACCIDENT, HX OF (ICD-V12.50) CONGESTIVE HEART FAILURE (ICD-428.0) DIABETES MELLITUS, TYPE II (ICD-250.00) CHRONIC KIDNEY DISEASE STAGE II (MILD) (ICD-585.2) HYPOTHYROIDISM (ICD-244.9) GOUT (ICD-274.9)  Past Surgical History: Reviewed history from 09/27/2009 and no changes required. Thyroidectomy  CO2 aortogram, selective renal angiography.      Family History: Reviewed history from 10/31/2007 and no changes required. Family History Lung   father Family  History Diabetes 1st degree relative  mother mother with renal failure  Social History: Reviewed history from 07/18/2007 and no changes required. Retired Married Former Smoker  Review of Systems       The patient complains of difficulty walking.  The patient denies anorexia, fever, weight loss, weight gain, vision loss, decreased hearing, hoarseness, chest pain, syncope, dyspnea on exertion, peripheral edema, prolonged cough, headaches, hemoptysis, abdominal pain, melena, hematochezia, severe indigestion/heartburn, hematuria, incontinence, genital sores, muscle weakness, suspicious skin lesions, transient blindness, depression, unusual weight change, abnormal bleeding, enlarged lymph nodes, angioedema, breast masses, and testicular masses.         knee pain and   Physical Exam  General:  Well-developed,well-nourished,in no acute distress; alert,appropriate and cooperative throughout examination Head:  normocephalic and atraumatic.   Eyes:  pupils equal and pupils round.   Ears:  R ear normal and L ear normal.   Nose:  no  external deformity and no nasal discharge.   Neck:  supple and full ROM.   Lungs:  normal respiratory effort and no wheezes.   Heart:  normal rate, regular rhythm, and Grade 2  /6 systolic ejection murmur.   Abdomen:  soft, normal bowel sounds, and distended.   Msk:  decreased ROM, joint tenderness, and joint swelling.  in right knee Neurologic:  alert & oriented X3.     Impression & Recommendations:  Problem # 1:  LEG CRAMPS, IDIOPATHIC (ICD-729.82) Assessment New add magnesium and moniter electrolyts Orders: Venipuncture (16109) TLB-BMP (Basic Metabolic Panel-BMET) (80048-METABOL) TLB-Calcium (82310-CA) TLB-Magnesium (Mg) (83735-MG) Prescription Created Electronically [phone])  Problem # 2:  HYPERTENSION (ICD-401.9)  His updated medication list for this problem includes:    Lasix 80 Mg Tabs (Furosemide) ..... Once daily and 1/2 in pm as needed    Azor  10-40 Mg Tabs (Amlodipine-olmesartan) ..... One by mouth daily    Bystolic 10 Mg Tabs (Nebivolol hcl) ..... One by mouth daily  BP today: 150/78 Prior BP: 140/84 (01/04/2010)  Prior 10 Yr Risk Heart Disease: N/A (09/25/2007)  Labs Reviewed: K+: 4.6 (01/04/2010) Creat: : 2.0 (01/04/2010)   Chol: 145 (09/27/2009)   HDL: 57.40 (09/27/2009)   LDL: 65 (09/27/2009)   TG: 113.0 (09/27/2009)  Problem # 3:  CORONARY ARTERY DISEASE (ICD-414.00) Assessment: Unchanged  no reported chest  pain His updated medication list for this problem includes:    Lasix 80 Mg Tabs (Furosemide) ..... Once daily and 1/2 in pm as needed    Aspirin 325 Mg Tabs (Aspirin) ..... Once daily    Azor 10-40 Mg Tabs (Amlodipine-olmesartan) ..... One by mouth daily    Imdur 60 Mg Xr24h-tab (Isosorbide mononitrate) ..... One  tab by mouth once daily    Bystolic 10 Mg Tabs (Nebivolol hcl) ..... One by mouth daily    Nitrostat 0.4 Mg Subl (Nitroglycerin) ..... 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 After care discussed. Pt tolerated procedure well.  The following medications were removed from the medication list:    Tramadol Hcl 50 Mg Tabs (Tramadol hcl) .Marland Kitchen... 1 three times a day as needed  pain His updated medication list for this problem includes:    Aspirin 325 Mg Tabs (Aspirin) ..... Once daily    Hydrocodone-acetaminophen 5-500 Mg Tabs (Hydrocodone-acetaminophen) ..... One by mouth q 6 hours as needed gout pain  Discussed use of medications, application of heat or cold, and exercises.   Orders: Joint Aspirate / Injection, Large (20610) Synvisc injection, 2 ml (U9811)  Complete Medication List: 1)  Lasix 80 Mg Tabs (Furosemide) .... Once daily and 1/2 in pm as needed 2)  Aspirin 325 Mg Tabs (Aspirin) .... Once  daily 3)  Synthroid 200 Mcg Tabs (Levothyroxine sodium) .... Onew by mouth daily 4)  Glyburide 5 Mg Tabs (Glyburide) .Marland Kitchen.. 1 two times a day 5)  Zocor 80 Mg Tabs (Simvastatin) .... One by mouth daily 6)  Azor 10-40 Mg Tabs (Amlodipine-olmesartan) .... One by mouth daily 7)  Calcitriol 0.25 Mcg Caps (Calcitriol) .... 2 on even days and 1 on odd days 8)  Imdur 60 Mg Xr24h-tab (Isosorbide mononitrate) .... One  tab by mouth once daily 9)  Colcrys 0.6 Mg Tabs (Colchicine) .Marland Kitchen.. 1 two times a day as needed gout 10)  Bystolic 10 Mg Tabs (Nebivolol hcl) .... One by mouth daily 11)  Chlorzoxazone 500 Mg Tabs (Chlorzoxazone) .... One by mouth two times a day as needed muscle spasm 12)  Nitrostat 0.4 Mg Subl (Nitroglycerin) .... Use as directed 13)  Hydrocodone-acetaminophen 5-500 Mg Tabs (Hydrocodone-acetaminophen) .... One by mouth q 6 hours as needed gout pain 14)  Patanol 0.1 % Soln (Olopatadine hcl) .... One drop in each eye two times a day  Other Orders: TLB-A1C / Hgb A1C (Glycohemoglobin) (83036-A1C)  Patient Instructions: 1)  Please schedule a follow-up appointment in one week for second injection Prescriptions: CHLORZOXAZONE 500 MG TABS (CHLORZOXAZONE) one by mouth two times a day as needed muscle spasm  #60 x 3   Entered and Authorized by:   Stacie Glaze MD   Signed by:   Stacie Glaze MD on 04/06/2010   Method used:   Electronically to        [facility]  [address] [phone]* (retail)       [address]       [place], [place]  [address]       Ph: [phone] or [phone]       Fax: [phone]   RxID:   [id number] HYDROCODONE-ACETAMINOPHEN 5-500 MG TABS (HYDROCODONE-ACETAMINOPHEN) one by mouth q 6 hours as needed gout pain  #30 x 1   Entered by:   Willy Eddy, LPN   Authorized by:   Stacie Glaze MD   Signed by:   Willy Eddy, LPN on 36/64/4034   Method used:   Print then Give to Patient   RxID:   [id number] COLCRYS 0.6 MG TABS (COLCHICINE) 1 two times a day as needed gout  #60 x 3   Entered by:   Willy Eddy, LPN   Authorized by:   Stacie Glaze MD   Signed by:   Willy Eddy, LPN on 95/18/8416   Method used:   Print then Give to Patient   RxID:   [id number] SYNTHROID 200 MCG  TABS (LEVOTHYROXINE SODIUM) onew by mouth daily  #30 Tablet x 5   Entered by:   Willy Eddy, LPN   Authorized by:   Stacie Glaze MD   Signed by:   Willy Eddy, LPN on 73/22/0254   Method used:   Print then Give to Patient   RxID:   [id number]   Appended Document: roa appt at 8:30/ moved from 11;30 bmw synvisc injection- lot [id number]--exp 11-13

## 2010-11-17 NOTE — Medication Information (Signed)
Summary: Order for Diabetes Testing Supplies  Order for Diabetes Testing Supplies   Imported By: Maryln Gottron 02/21/2010 13:13:05  _____________________________________________________________________  External Attachment:    Type:   Image     Comment:   External Document

## 2010-11-17 NOTE — Assessment & Plan Note (Signed)
Summary: 3 month rov/njr   Vital Signs:  Patient profile:   75 year old male Height:      69 inches Weight:      201 pounds BMI:     29.79 Temp:     98.2 degrees F oral Pulse rate:   68 / minute Resp:     14 per minute BP sitting:   154 / 80  (left arm)  Vitals Entered By: Willy Eddy, LPN (December 07, 2009 9:34 AM) CC: roa- dr [name] stopped plavix after pt's myoview   CC:  roa- dr [name] stopped plavix after pt's myoview.  History of Present Illness: follow up HTN , CAD and renal insuficiency  Hypertension Follow-Up      This is a 75 year old man who presents for Hypertension follow-up.  poor controll.  The patient denies lightheadedness, urinary frequency, headaches, edema, impotence, rash, and fatigue.  The patient denies the following associated symptoms: chest pain, chest pressure, exercise intolerance, dyspnea, palpitations, syncope, leg edema, and pedal edema.  Compliance with medications (by patient report) has been near 100%.  The patient reports that dietary compliance has been fair.  The patient reports exercising occasionally.    Preventive Screening-Counseling & Management  Alcohol-Tobacco     Smoking Status: quit     Packs/Day: 0.5     Year Started: 1957     Year Quit: 1967     Passive Smoke Exposure: no  Problems Prior to Update: 1)  Precordial Pain  (ICD-786.51) 2)  Coronary Artery Disease  (ICD-414.00) 3)  Hypertension  (ICD-401.9) 4)  Hyperlipidemia  (ICD-272.4) 5)  Cerebrovascular Accident, Hx of  (ICD-V12.50) 6)  Congestive Heart Failure  (ICD-428.0) 7)  Uns Advrs Eff Uns Rx Medicinal&biological Sbstnc  (ICD-995.20) 8)  Back Pain  (ICD-724.5) 9)  Diabetes Mellitus, Type II  (ICD-250.00) 10)  Chronic Kidney Disease Stage II (MILD)  (ICD-585.2) 11)  Hyperkalemia  (ICD-276.7) 12)  Family History Diabetes 1st Degree Relative  (ICD-V18.0) 13)  Hypothyroidism  (ICD-244.9) 14)  Gout  (ICD-274.9) 15)  Hypercholesterolemia   (ICD-272.0)  Medications Prior to Update: 1)  Lasix 80 Mg  Tabs (Furosemide) .... Once Daily and 1/2 in Pm As Needed 2)  Aspirin 325 Mg  Tabs (Aspirin) .... Once Daily 3)  Synthroid 200 Mcg  Tabs (Levothyroxine Sodium) .... Onew By Mouth Daily 4)  Glyburide 5 Mg  Tabs (Glyburide) .Marland Kitchen.. 1 Two Times A Day 5)  Zocor 80 Mg  Tabs (Simvastatin) .... One By Mouth Daily 6)  Norvasc 10 Mg  Tabs (Amlodipine Besylate) .... Once Daily 7)  Calcitriol 0.25 Mcg  Caps (Calcitriol) .... 2 On Even Days and 1 On Odd Days 8)  Imdur 60 Mg Xr24h-Tab (Isosorbide Mononitrate) .... One  Tab By Mouth Once Daily 9)  Colchicine 0.6 Mg Tabs (Colchicine) .... One By Two Times A Day As Needed Gout 10)  Lisinopril 40 Mg Tabs (Lisinopril) .... One By Mouth Daily 11)  Bystolic 10 Mg Tabs (Nebivolol Hcl) .... One By Mouth Daily 12)  Parafon Forte Dsc 500 Mg Tabs (Chlorzoxazone) .Marland Kitchen.. 1 Three Times A Day As Needed 13)  Tramadol Hcl 50 Mg Tabs (Tramadol Hcl) .Marland Kitchen.. 1 Three Times A Day As Needed Pain 14)  Nitrostat 0.4 Mg Subl (Nitroglycerin) .... Use As Directed 15)  Hydrocodone-Acetaminophen 5-500 Mg Tabs (Hydrocodone-Acetaminophen) .... One By Mouth Q 6 Hours As Needed Gout Pain  Current Medications (verified): 1)  Lasix 80 Mg  Tabs (Furosemide) .... Once Daily and  1/2 in Pm As Needed 2)  Aspirin 325 Mg  Tabs (Aspirin) .... Once Daily 3)  Synthroid 200 Mcg  Tabs (Levothyroxine Sodium) .... Onew By Mouth Daily 4)  Glyburide 5 Mg  Tabs (Glyburide) .Marland Kitchen.. 1 Two Times A Day 5)  Zocor 80 Mg  Tabs (Simvastatin) .... One By Mouth Daily 6)  Azor 10-40 Mg Tabs (Amlodipine-Olmesartan) .... One By Mouth Daily 7)  Calcitriol 0.25 Mcg  Caps (Calcitriol) .... 2 On Even Days and 1 On Odd Days 8)  Imdur 60 Mg Xr24h-Tab (Isosorbide Mononitrate) .... One  Tab By Mouth Once Daily 9)  Colchicine 0.6 Mg Tabs (Colchicine) .... One By Two Times A Day As Needed Gout 10)  Bystolic 10 Mg Tabs (Nebivolol Hcl) .... One By Mouth Daily 11)  Parafon Forte  Dsc 500 Mg Tabs (Chlorzoxazone) .Marland Kitchen.. 1 Three Times A Day As Needed 12)  Tramadol Hcl 50 Mg Tabs (Tramadol Hcl) .Marland Kitchen.. 1 Three Times A Day As Needed Pain 13)  Nitrostat 0.4 Mg Subl (Nitroglycerin) .... Use As Directed 14)  Hydrocodone-Acetaminophen 5-500 Mg Tabs (Hydrocodone-Acetaminophen) .... One By Mouth Q 6 Hours As Needed Gout Pain  Allergies (verified): No Known Drug Allergies  Past History:  Family History: Last updated: 10/31/2007 Family History Lung   father Family History Diabetes 1st degree relative  mother mother with renal failure  Social History: Last updated: 07/18/2007 Retired Married Former Smoker  Risk Factors: Smoking Status: quit (12/07/2009) Packs/Day: 0.5 (12/07/2009) Passive Smoke Exposure: no (12/07/2009)  Past medical, surgical, family and social histories (including risk factors) reviewed, and no changes noted (except as noted below).  Past Medical History: Reviewed history from 09/27/2009 and no changes required. Current Problems:  CORONARY ARTERY DISEASE (ICD-414.00) ( followed by [facility]) HYPERTENSION (ICD-401.9) HYPERLIPIDEMIA (ICD-272.4) CEREBROVASCULAR ACCIDENT, HX OF (ICD-V12.50) CONGESTIVE HEART FAILURE (ICD-428.0) DIABETES MELLITUS, TYPE II (ICD-250.00) CHRONIC KIDNEY DISEASE STAGE II (MILD) (ICD-585.2) HYPOTHYROIDISM (ICD-244.9) GOUT (ICD-274.9)  Past Surgical History: Reviewed history from 09/27/2009 and no changes required. Thyroidectomy  CO2 aortogram, selective renal angiography.      Family History: Reviewed history from 10/31/2007 and no changes required. Family History Lung   father Family History Diabetes 1st degree relative  mother mother with renal failure  Social History: Reviewed history from 07/18/2007 and no changes required. Retired Married Former Smoker  Review of Systems  The patient denies anorexia, fever, weight loss, weight gain, vision loss, decreased hearing, hoarseness, chest pain, syncope, dyspnea  on exertion, peripheral edema, prolonged cough, headaches, hemoptysis, abdominal pain, melena, hematochezia, severe indigestion/heartburn, hematuria, incontinence, genital sores, muscle weakness, suspicious skin lesions, transient blindness, difficulty walking, depression, unusual weight change, abnormal bleeding, enlarged lymph nodes, angioedema, and breast masses.    Physical Exam  General:  Well-developed,well-nourished,in no acute distress; alert,appropriate and cooperative throughout examination Eyes:  pupils equal and pupils round.   Ears:  R ear normal and L ear normal.   Nose:  no external deformity and no nasal discharge.   Neck:  supple and full ROM.   Lungs:  normal respiratory effort and no wheezes.   Heart:  normal rate, regular rhythm, and Grade 2  /6 systolic ejection murmur.   Abdomen:  soft, normal bowel sounds, and distended.   Msk:  joint tenderness and joint swelling.   Extremities:  trace left pedal edema and trace right pedal edema.   Neurologic:  alert & oriented X3.    Diabetes Management Exam:    Foot Exam (with socks and/or shoes not present):  Sensory-Pinprick/Light touch:          Left medial foot (L-4): diminished          Left dorsal foot (L-5): diminished          Left lateral foot (S-1): diminished          Right medial foot (L-4): diminished          Right dorsal foot (L-5): diminished          Right lateral foot (S-1): diminished       Sensory-Monofilament:          Left foot: diminished          Right foot: diminished       Inspection:          Left foot: normal          Right foot: normal   Impression & Recommendations:  Problem # 1:  HYPERTENSION (ICD-401.9) Assessment Unchanged the prior meds were not producing optimal blood pressure control so we will give hm samples of azor The following medications were removed from the medication list:    Lisinopril 40 Mg Tabs (Lisinopril) ..... One by mouth daily His updated medication list for  this problem includes:    Lasix 80 Mg Tabs (Furosemide) ..... Once daily and 1/2 in pm as needed    Azor 10-40 Mg Tabs (Amlodipine-olmesartan) ..... One by mouth daily    Bystolic 10 Mg Tabs (Nebivolol hcl) ..... One by mouth daily  BP today: 154/80 Prior BP: 156/75 (09/27/2009)  Prior 10 Yr Risk Heart Disease: N/A (09/25/2007)  Labs Reviewed: K+: 3.9 (09/06/2009) Creat: : 1.9 (09/06/2009)   Chol: 145 (09/27/2009)   HDL: 57.40 (09/27/2009)   LDL: 65 (09/27/2009)   TG: 113.0 (09/27/2009)  Problem # 2:  HYPERLIPIDEMIA (ICD-272.4) Assessment: Unchanged stable His updated medication list for this problem includes:    Zocor 80 Mg Tabs (Simvastatin) ..... One by mouth daily  Labs Reviewed: SGOT: 14 (09/27/2009)   SGPT: 9 (09/27/2009)  Lipid Goals: Chol Goal: 200 (07/18/2007)   HDL Goal: 40 (07/18/2007)   LDL Goal: 100 (09/25/2007)   TG Goal: 150 (07/18/2007)  Prior 10 Yr Risk Heart Disease: N/A (09/25/2007)   HDL:57.40 (09/27/2009), 47.4 (08/05/2008)  LDL:65 (09/27/2009), 69 (08/05/2008)  Chol:145 (09/27/2009), 137 (08/05/2008)  Trig:113.0 (09/27/2009), 103 (08/05/2008)  Problem # 3:  CEREBROVASCULAR ACCIDENT, HX OF (ICD-V12.50) hx of CVA in past so blood pressure ocntroll is crutial and this was explained n detail to the pt  Complete Medication List: 1)  Lasix 80 Mg Tabs (Furosemide) .... Once daily and 1/2 in pm as needed 2)  Aspirin 325 Mg Tabs (Aspirin) .... Once daily 3)  Synthroid 200 Mcg Tabs (Levothyroxine sodium) .... Onew by mouth daily 4)  Glyburide 5 Mg Tabs (Glyburide) .Marland Kitchen.. 1 two times a day 5)  Zocor 80 Mg Tabs (Simvastatin) .... One by mouth daily 6)  Azor 10-40 Mg Tabs (Amlodipine-olmesartan) .... One by mouth daily 7)  Calcitriol 0.25 Mcg Caps (Calcitriol) .... 2 on even days and 1 on odd days 8)  Imdur 60 Mg Xr24h-tab (Isosorbide mononitrate) .... One  tab by mouth once daily 9)  Colchicine 0.6 Mg Tabs (Colchicine) .... One by two times a day as needed gout 10)   Bystolic 10 Mg Tabs (Nebivolol hcl) .... One by mouth daily 11)  Parafon Forte Dsc 500 Mg Tabs (Chlorzoxazone) .Marland Kitchen.. 1 three times a day as needed 12)  Tramadol Hcl 50 Mg Tabs (Tramadol hcl) .Marland KitchenMarland KitchenMarland Kitchen  1 three times a day as needed pain 13)  Nitrostat 0.4 Mg Subl (Nitroglycerin) .... Use as directed 14)  Hydrocodone-acetaminophen 5-500 Mg Tabs (Hydrocodone-acetaminophen) .... One by mouth q 6 hours as needed gout pain  Patient Instructions: 1)  change the lisenopril and the amlodipine to one bill called Azor 10/40 one a day 2)  Please schedule a follow-up appointment in 1 month.

## 2010-11-17 NOTE — Letter (Signed)
Summary: [facility]   Imported By: Maryln Gottron 03/21/2010 13:42:08  _____________________________________________________________________  External Attachment:    Type:   Image     Comment:   External Document

## 2010-11-17 NOTE — Letter (Signed)
Summary: [facility]  [facility]   Imported By: Maryln Gottron 07/27/2010 15:58:02  _____________________________________________________________________  External Attachment:    Type:   Image     Comment:   External Document

## 2010-11-17 NOTE — Assessment & Plan Note (Signed)
Summary: inj in knee/pt coming in at 8am per Dr Jenkins/cjr   Vital Signs:  Patient profile:   75 year old male Weight:      200 pounds BMI:     29.64 Temp:     98.2 degrees F oral Pulse rate:   72 / minute Resp:     14 per minute BP sitting:   150 / 80  (left arm)  Vitals Entered By: Willy Eddy, LPN (April 20, 453 8:12 AM) CC: 3rd synvisc injection- lot [id number] 08-2012   CC:  3rd synvisc injection- lot [id number] 08-2012.  History of Present Illness: thrid injection  Allergies: No Known Drug Allergies   Impression & Recommendations:  Problem # 1:  LOC OSTEOARTHROS NOT SPEC PRIM/SEC LOWER LEG (ICD-715.36)  pt was prepped in a sterile manor and 2cc of synvisc was injected into the knee.The pt tolerated the procedure well and was given post procedure instructions.  His updated medication list for this problem includes:    Aspirin 325 Mg Tabs (Aspirin) ..... Once daily    Hydrocodone-acetaminophen 5-500 Mg Tabs (Hydrocodone-acetaminophen) ..... One by mouth q 6 hours as needed gout pain  Discussed use of medications, application of heat or cold, and exercises.   Orders: No Charge Patient Arrived (NCPA0) (NCPA0) Joint Aspirate / Injection, Large (20610) Synvisc injection, 2 ml (U9811)  Complete Medication List: 1)  Lasix 80 Mg Tabs (Furosemide) .... Once daily and 1/2 in pm as needed 2)  Aspirin 325 Mg Tabs (Aspirin) .... Once daily 3)  Synthroid 200 Mcg Tabs (Levothyroxine sodium) .... Onew by mouth daily 4)  Glyburide 5 Mg Tabs (Glyburide) .Marland Kitchen.. 1 two times a day 5)  Zocor 80 Mg Tabs (Simvastatin) .... One by mouth daily 6)  Azor 10-40 Mg Tabs (Amlodipine-olmesartan) .... One by mouth daily 7)  Calcitriol 0.25 Mcg Caps (Calcitriol) .... 2 on even days and 1 on odd days 8)  Imdur 60 Mg Xr24h-tab (Isosorbide mononitrate) .... One  tab by mouth once daily 9)  Colcrys 0.6 Mg Tabs (Colchicine) .Marland Kitchen.. 1 two times a day as needed gout 10)  Bystolic 10 Mg Tabs  (Nebivolol hcl) .... One by mouth daily 11)  Chlorzoxazone 500 Mg Tabs (Chlorzoxazone) .... One by mouth two times a day as needed muscle spasm 12)  Nitrostat 0.4 Mg Subl (Nitroglycerin) .... Use as directed 13)  Hydrocodone-acetaminophen 5-500 Mg Tabs (Hydrocodone-acetaminophen) .... One by mouth q 6 hours as needed gout pain 14)  Patanol 0.1 % Soln (Olopatadine hcl) .... One drop in each eye two times a day

## 2010-11-17 NOTE — Letter (Signed)
Summary: [facility]  [facility]   Imported By: Maryln Gottron 10/13/2010 09:53:32  _____________________________________________________________________  External Attachment:    Type:   Image     Comment:   External Document

## 2010-11-17 NOTE — Assessment & Plan Note (Signed)
Summary: 9 month rov/sl   Primary Provider:  Stacie Glaze MD  CC:  9 month ROV; 'tingle' feeling in chest- last episode x 2 months ago.  History of Present Illness: [name] [name] is a pleasant gentleman with history of coronary artery disease.  His most recent catheterization was in March 2005 and that showed a 40% left main, no significant disease in the circumflex, total occlusion of the ramus branch with a 50% first marginal, 40% proximal, followed by 95% mid, and 90% mid distal stenosis in the right coronary artery.  The patient had successful PCI of the right coronary artery both the mid and mid-to- distal right lesions.  His last Myoview was performed in Dec 2010. His ejection fraction was 45%.  There was a fixed defect in the inferior wall without ischemia. His last echocardiogram on September 20, 2007 showed normal LV function.  There was mild aortic root dilatation. I last saw him in Dec of 2010. Since then the patient denies dyspnea on exertion, orthopnea, PND, pedal edema, palpitations, syncope or exertional chest pain.  Current Medications (verified): 1)  Lasix 80 Mg  Tabs (Furosemide) .... Once Daily and 1/2 in Pm As Needed 2)  Aspirin 325 Mg  Tabs (Aspirin) .... Once Daily 3)  Synthroid 200 Mcg  Tabs (Levothyroxine Sodium) .... Onew By Mouth Daily 4)  Glyburide 5 Mg  Tabs (Glyburide) .Marland Kitchen.. 1 Two Times A Day 5)  Zocor 80 Mg  Tabs (Simvastatin) .... One By Mouth Daily 6)  Azor 10-40 Mg Tabs (Amlodipine-Olmesartan) .... One By Mouth Daily 7)  Calcitriol 0.25 Mcg  Caps (Calcitriol) .... 2 On Even Days and 1 On Odd Days 8)  Imdur 60 Mg Xr24h-Tab (Isosorbide Mononitrate) .... One  Tab By Mouth Once Daily 9)  Colcrys 0.6 Mg Tabs (Colchicine) .Marland Kitchen.. 1 Two Times A Day As Needed Gout 10)  Bystolic 10 Mg Tabs (Nebivolol Hcl) .... One By Mouth Daily 11)  Chlorzoxazone 500 Mg Tabs (Chlorzoxazone) .... One By Mouth Two Times A Day As Needed Muscle Spasm 12)  Nitrostat 0.4 Mg Subl (Nitroglycerin)  .... Use As Directed 13)  Hydrocodone-Acetaminophen 5-500 Mg Tabs (Hydrocodone-Acetaminophen) .... One By Mouth Q 6 Hours As Needed Gout Pain 14)  Patanol 0.1 % Soln (Olopatadine Hcl) .... One Drop in Each Eye Two Times A Day 15)  Magnesium 250 Mg Tabs (Magnesium) .... Take 1 By Mouth Once Daily  Allergies (verified): No Known Drug Allergies  Past History:  Past Medical History: CORONARY ARTERY DISEASE (ICD-414.00) ( followed by [name] [name]) HYPERTENSION (ICD-401.9) HYPERLIPIDEMIA (ICD-272.4) CEREBROVASCULAR ACCIDENT, HX OF (ICD-V12.50) CONGESTIVE HEART FAILURE (ICD-428.0) DIABETES MELLITUS, TYPE II (ICD-250.00) CHRONIC KIDNEY DISEASE STAGE II (MILD) (ICD-585.2) HYPOTHYROIDISM (ICD-244.9) GOUT (ICD-274.9)  Past Surgical History: Reviewed history from 09/27/2009 and no changes required. Thyroidectomy  CO2 aortogram, selective renal angiography.      Social History: Reviewed history from 07/18/2007 and no changes required. Retired Married Former Smoker  Review of Systems       no fevers or chills, productive cough, hemoptysis, dysphasia, odynophagia, melena, hematochezia, dysuria, hematuria, rash, seizure activity, orthopnea, PND, pedal edema, claudication. Remaining systems are negative.   Vital Signs:  Patient profile:   75 year old male Height:      69 inches Weight:      204.25 pounds BMI:     30.27 Pulse rate:   66 / minute Pulse rhythm:   regular BP sitting:   172 / 80  (right arm)  Physical Exam  General:  Well-developed well-nourished in no acute distress.  Skin is warm and dry.  HEENT is normal.  Neck is supple. No thyromegaly.  Chest is clear to auscultation with normal expansion.  Cardiovascular exam is regular rate and rhythm.  Abdominal exam nontender or distended. No masses palpated. Extremities show no edema. neuro grossly intact    EKG  Procedure date:  09/26/2010  Findings:      NSR with first degree AV block; nonspecific ST changes;  prolonged QT  Impression & Recommendations:  Problem # 1:  CORONARY ARTERY DISEASE (ICD-414.00) Continued aspirin, beta blocker and statin. Last Myoview low risk. His updated medication list for this problem includes:    Aspirin 325 Mg Tabs (Aspirin) ..... Once daily    Azor 10-40 Mg Tabs (Amlodipine-olmesartan) ..... One by mouth daily    Imdur 60 Mg Xr24h-tab (Isosorbide mononitrate) ..... One  tab by mouth once daily    Bystolic 10 Mg Tabs (Nebivolol hcl) ..... One by mouth daily    Nitrostat 0.4 Mg Subl (Nitroglycerin) ..... Use as directed  Orders: EKG w/ Interpretation (93000)  His updated medication list for this problem includes:    Aspirin 325 Mg Tabs (Aspirin) ..... Once daily    Azor 10-40 Mg Tabs (Amlodipine-olmesartan) ..... One by mouth daily    Imdur 60 Mg Xr24h-tab (Isosorbide mononitrate) ..... One  tab by mouth once daily    Bystolic 10 Mg Tabs (Nebivolol hcl) ..... One by mouth daily    Nitrostat 0.4 Mg Subl (Nitroglycerin) ..... Use as directed  Problem # 2:  HYPERTENSION (ICD-401.9) Blood pressure elevated today.h However he states typically normal at home. He will follow this and we will increase medications as needed. Renal function and potassium monitored by nephrology. His updated medication list for this problem includes:    Lasix 80 Mg Tabs (Furosemide) ..... Once daily and 1/2 in pm as needed    Aspirin 325 Mg Tabs (Aspirin) ..... Once daily    Azor 10-40 Mg Tabs (Amlodipine-olmesartan) ..... One by mouth daily    Bystolic 10 Mg Tabs (Nebivolol hcl) ..... One by mouth daily  Problem # 3:  HYPERLIPIDEMIA (ICD-272.4) Given use of Norvasc we'll discontinue Zocor and begin Lipitor 40 mg p.o. daily. Check lipids and liver in 6 weeks. His updated medication list for this problem includes:    Lipitor 40 Mg Tabs (Atorvastatin calcium) .Marland Kitchen... Take one tablet by mouth daily.  Problem # 4:  DIABETES MELLITUS, TYPE II (ICD-250.00)  His updated medication list for  this problem includes:    Aspirin 325 Mg Tabs (Aspirin) ..... Once daily    Glyburide 5 Mg Tabs (Glyburide) .Marland Kitchen... 1 two times a day    Azor 10-40 Mg Tabs (Amlodipine-olmesartan) ..... One by mouth daily  Problem # 5:  CONGESTIVE HEART FAILURE (ICD-428.0) Euvolemic on examination. Continue present medications. His updated medication list for this problem includes:    Lasix 80 Mg Tabs (Furosemide) ..... Once daily and 1/2 in pm as needed    Aspirin 325 Mg Tabs (Aspirin) ..... Once daily    Azor 10-40 Mg Tabs (Amlodipine-olmesartan) ..... One by mouth daily    Imdur 60 Mg Xr24h-tab (Isosorbide mononitrate) ..... One  tab by mouth once daily    Bystolic 10 Mg Tabs (Nebivolol hcl) ..... One by mouth daily    Nitrostat 0.4 Mg Subl (Nitroglycerin) ..... Use as directed  Problem # 6:  CHRONIC KIDNEY DISEASE STAGE II (MILD) (ICD-585.2) Followed by nephrology.  Problem # 7:  HYPOTHYROIDISM (  ICD-244.9)  His updated medication list for this problem includes:    Synthroid 200 Mcg Tabs (Levothyroxine sodium) ..... Onew by mouth daily  Problem # 8:  HYPERCHOLESTEROLEMIA (ICD-272.0) Discontinue Zocor and begin Lipitor as described above. Lipids and liver in 6 weeks. His updated medication list for this problem includes:    Lipitor 40 Mg Tabs (Atorvastatin calcium) .Marland Kitchen... Take one tablet by mouth daily.  Patient Instructions: 1)  Your physician recommends that you schedule a follow-up appointment in: 1year 2)  Your physician recommends that you return for a FASTING lipid profile and liver function test in 1 month. 3)  Your physician has recommended you make the following change in your medication: STOP ZOCOR and START 40mg  by mouth of Lipitor daily.  Prescriptions: LIPITOR 40 MG TABS (ATORVASTATIN CALCIUM) Take one tablet by mouth daily.  #30 x 3   Entered by:   Ellender Hose RN   Authorized by:   Ferman Hamming, MD, [facility]   Signed by:   Ellender Hose RN on 09/26/2010   Method used:    Electronically to        [facility]  [address] [phone]* (retail)       [address]       [place], [place]  [address]       Ph: [phone] or [phone]       Fax: [phone]   RxID:   [id number]

## 2010-11-17 NOTE — Letter (Signed)
Summary: [facility]  [facility]   Imported By: Maryln Gottron 04/22/2010 12:44:41  _____________________________________________________________________  External Attachment:    Type:   Image     Comment:   External Document

## 2010-11-17 NOTE — Assessment & Plan Note (Signed)
Summary: synvisc injection-2nd/bmw   Vital Signs:  Patient profile:   75 year old male Weight:      200 pounds BMI:     29.64 Temp:     98.2 degrees F Pulse rate:   72 / minute BP sitting:   150 / 78  (left arm)  Vitals Entered By: Willy Eddy, LPN (April 13, 2010 12:29 PM) CC: 2nd synvisc injection--lot [id number] exp-11-13   CC:  2nd synvisc injection--lot [id number] exp-11-13.  History of Present Illness: for second synvisc injection  Allergies: No Known Drug Allergies   Impression & Recommendations:  Problem # 1:  LOC OSTEOARTHROS NOT SPEC PRIM/SEC LOWER LEG (ICD-715.36)  Informed consent obtained and 2cc of synvisc was inhected  After care discussed. Pt tolerated procedure well.  His updated medication list for this problem includes:    Aspirin 325 Mg Tabs (Aspirin) ..... Once daily    Hydrocodone-acetaminophen 5-500 Mg Tabs (Hydrocodone-acetaminophen) ..... One by mouth q 6 hours as needed gout pain  Discussed use of medications, application of heat or cold, and exercises.   Orders: No Charge Patient Arrived (NCPA0) (NCPA0) Joint Aspirate / Injection, Large (20610) Synvisc injection, 2 ml (H0865)  Complete Medication List: 1)  Lasix 80 Mg Tabs (Furosemide) .... Once daily and 1/2 in pm as needed 2)  Aspirin 325 Mg Tabs (Aspirin) .... Once daily 3)  Synthroid 200 Mcg Tabs (Levothyroxine sodium) .... Onew by mouth daily 4)  Glyburide 5 Mg Tabs (Glyburide) .Marland Kitchen.. 1 two times a day 5)  Zocor 80 Mg Tabs (Simvastatin) .... One by mouth daily 6)  Azor 10-40 Mg Tabs (Amlodipine-olmesartan) .... One by mouth daily 7)  Calcitriol 0.25 Mcg Caps (Calcitriol) .... 2 on even days and 1 on odd days 8)  Imdur 60 Mg Xr24h-tab (Isosorbide mononitrate) .... One  tab by mouth once daily 9)  Colcrys 0.6 Mg Tabs (Colchicine) .Marland Kitchen.. 1 two times a day as needed gout 10)  Bystolic 10 Mg Tabs (Nebivolol hcl) .... One by mouth daily 11)  Chlorzoxazone 500 Mg Tabs (Chlorzoxazone) .... One by  mouth two times a day as needed muscle spasm 12)  Nitrostat 0.4 Mg Subl (Nitroglycerin) .... Use as directed 13)  Hydrocodone-acetaminophen 5-500 Mg Tabs (Hydrocodone-acetaminophen) .... One by mouth q 6 hours as needed gout pain 14)  Patanol 0.1 % Soln (Olopatadine hcl) .... One drop in each eye two times a day

## 2010-11-17 NOTE — Assessment & Plan Note (Signed)
Summary: 1 month fup//ccm   Vital Signs:  Patient profile:   75 year old male Height:      69 inches Weight:      202 pounds BMI:     29.94 Temp:     98.2 degrees F oral Pulse rate:   72 / minute Pulse rhythm:   regular Resp:     14 per minute BP sitting:   140 / 70  (left arm)  Vitals Entered By: Willy Eddy, LPN (May 05, 2010 10:58 AM) CC: roa, Hypertension Management Is Patient Diabetic? Yes Did you bring your meter with you today? No   CC:  roa and Hypertension Management.  History of Present Illness: follow up of the synvics injection therapy with improvement noted in knee pain and swelling  monitering of his HTN with good controll without evidence of CHF  blood sugars have been in controll  back pain is controlled  Hypertension History:      He denies headache, chest pain, palpitations, dyspnea with exertion, orthopnea, PND, peripheral edema, visual symptoms, neurologic problems, syncope, and side effects from treatment.        Positive major cardiovascular risk factors include male age 61 years old or older, diabetes, hyperlipidemia, and hypertension.  Negative major cardiovascular risk factors include negative family history for ischemic heart disease and non-tobacco-user status.        Positive history for target organ damage include ASHD (either angina/prior MI/prior CABG), cardiac end organ damage (either CHF or LVH), prior stroke (or TIA), and renal insufficiency.  Further assessment for target organ damage reveals no history of peripheral vascular disease.     Preventive Screening-Counseling & Management  Alcohol-Tobacco     Smoking Status: quit     Packs/Day: 0.5     Year Started: 1957     Year Quit: 1967     Passive Smoke Exposure: no  Problems Prior to Update: 1)  Loc Osteoarthros Not Spec Prim/sec Lower Leg  (ICD-715.36) 2)  Leg Cramps, Idiopathic  (ICD-729.82) 3)  Precordial Pain  (ICD-786.51) 4)  Coronary Artery Disease  (ICD-414.00) 5)   Hypertension  (ICD-401.9) 6)  Hyperlipidemia  (ICD-272.4) 7)  Cerebrovascular Accident, Hx of  (ICD-V12.50) 8)  Congestive Heart Failure  (ICD-428.0) 9)  Uns Advrs Eff Uns Rx Medicinal&biological Sbstnc  (ICD-995.20) 10)  Back Pain  (ICD-724.5) 11)  Diabetes Mellitus, Type II  (ICD-250.00) 12)  Chronic Kidney Disease Stage II (MILD)  (ICD-585.2) 13)  Hyperkalemia  (ICD-276.7) 14)  Family History Diabetes 1st Degree Relative  (ICD-V18.0) 15)  Hypothyroidism  (ICD-244.9) 16)  Gout  (ICD-274.9) 17)  Hypercholesterolemia  (ICD-272.0)  Current Problems (verified): 1)  Loc Osteoarthros Not Spec Prim/sec Lower Leg  (ICD-715.36) 2)  Leg Cramps, Idiopathic  (ICD-729.82) 3)  Precordial Pain  (ICD-786.51) 4)  Coronary Artery Disease  (ICD-414.00) 5)  Hypertension  (ICD-401.9) 6)  Hyperlipidemia  (ICD-272.4) 7)  Cerebrovascular Accident, Hx of  (ICD-V12.50) 8)  Congestive Heart Failure  (ICD-428.0) 9)  Uns Advrs Eff Uns Rx Medicinal&biological Sbstnc  (ICD-995.20) 10)  Back Pain  (ICD-724.5) 11)  Diabetes Mellitus, Type II  (ICD-250.00) 12)  Chronic Kidney Disease Stage II (MILD)  (ICD-585.2) 13)  Hyperkalemia  (ICD-276.7) 14)  Family History Diabetes 1st Degree Relative  (ICD-V18.0) 15)  Hypothyroidism  (ICD-244.9) 16)  Gout  (ICD-274.9) 17)  Hypercholesterolemia  (ICD-272.0)  Medications Prior to Update: 1)  Lasix 80 Mg  Tabs (Furosemide) .... Once Daily and 1/2 in Pm As Needed 2)  Aspirin 325 Mg  Tabs (Aspirin) .... Once Daily 3)  Synthroid 200 Mcg  Tabs (Levothyroxine Sodium) .... Onew By Mouth Daily 4)  Glyburide 5 Mg  Tabs (Glyburide) .Marland Kitchen.. 1 Two Times A Day 5)  Zocor 80 Mg  Tabs (Simvastatin) .... One By Mouth Daily 6)  Azor 10-40 Mg Tabs (Amlodipine-Olmesartan) .... One By Mouth Daily 7)  Calcitriol 0.25 Mcg  Caps (Calcitriol) .... 2 On Even Days and 1 On Odd Days 8)  Imdur 60 Mg Xr24h-Tab (Isosorbide Mononitrate) .... One  Tab By Mouth Once Daily 9)  Colcrys 0.6 Mg Tabs  (Colchicine) .Marland Kitchen.. 1 Two Times A Day As Needed Gout 10)  Bystolic 10 Mg Tabs (Nebivolol Hcl) .... One By Mouth Daily 11)  Chlorzoxazone 500 Mg Tabs (Chlorzoxazone) .... One By Mouth Two Times A Day As Needed Muscle Spasm 12)  Nitrostat 0.4 Mg Subl (Nitroglycerin) .... Use As Directed 13)  Hydrocodone-Acetaminophen 5-500 Mg Tabs (Hydrocodone-Acetaminophen) .... One By Mouth Q 6 Hours As Needed Gout Pain 14)  Patanol 0.1 % Soln (Olopatadine Hcl) .... One Drop in Each Eye Two Times A Day  Current Medications (verified): 1)  Lasix 80 Mg  Tabs (Furosemide) .... Once Daily and 1/2 in Pm As Needed 2)  Aspirin 325 Mg  Tabs (Aspirin) .... Once Daily 3)  Synthroid 200 Mcg  Tabs (Levothyroxine Sodium) .... Onew By Mouth Daily 4)  Glyburide 5 Mg  Tabs (Glyburide) .Marland Kitchen.. 1 Two Times A Day 5)  Zocor 80 Mg  Tabs (Simvastatin) .... One By Mouth Daily 6)  Azor 10-40 Mg Tabs (Amlodipine-Olmesartan) .... One By Mouth Daily 7)  Calcitriol 0.25 Mcg  Caps (Calcitriol) .... 2 On Even Days and 1 On Odd Days 8)  Imdur 60 Mg Xr24h-Tab (Isosorbide Mononitrate) .... One  Tab By Mouth Once Daily 9)  Colcrys 0.6 Mg Tabs (Colchicine) .Marland Kitchen.. 1 Two Times A Day As Needed Gout 10)  Bystolic 10 Mg Tabs (Nebivolol Hcl) .... One By Mouth Daily 11)  Chlorzoxazone 500 Mg Tabs (Chlorzoxazone) .... One By Mouth Two Times A Day As Needed Muscle Spasm 12)  Nitrostat 0.4 Mg Subl (Nitroglycerin) .... Use As Directed 13)  Hydrocodone-Acetaminophen 5-500 Mg Tabs (Hydrocodone-Acetaminophen) .... One By Mouth Q 6 Hours As Needed Gout Pain 14)  Patanol 0.1 % Soln (Olopatadine Hcl) .... One Drop in Each Eye Two Times A Day  Allergies: No Known Drug Allergies  Past History:  Family History: Last updated: 10/31/2007 Family History Lung   father Family History Diabetes 1st degree relative  mother mother with renal failure  Social History: Last updated: 07/18/2007 Retired Married Former Smoker  Risk Factors: Smoking Status: quit  (05/05/2010) Packs/Day: 0.5 (05/05/2010) Passive Smoke Exposure: no (05/05/2010)  Past medical, surgical, family and social histories (including risk factors) reviewed, and no changes noted (except as noted below).  Past Medical History: Reviewed history from 09/27/2009 and no changes required. Current Problems:  CORONARY ARTERY DISEASE (ICD-414.00) ( followed by [facility]) HYPERTENSION (ICD-401.9) HYPERLIPIDEMIA (ICD-272.4) CEREBROVASCULAR ACCIDENT, HX OF (ICD-V12.50) CONGESTIVE HEART FAILURE (ICD-428.0) DIABETES MELLITUS, TYPE II (ICD-250.00) CHRONIC KIDNEY DISEASE STAGE II (MILD) (ICD-585.2) HYPOTHYROIDISM (ICD-244.9) GOUT (ICD-274.9)  Past Surgical History: Reviewed history from 09/27/2009 and no changes required. Thyroidectomy  CO2 aortogram, selective renal angiography.      Family History: Reviewed history from 10/31/2007 and no changes required. Family History Lung   father Family History Diabetes 1st degree relative  mother mother with renal failure  Social History: Reviewed history from 07/18/2007 and no changes required.  Retired Married Former Smoker  Review of Systems  The patient denies anorexia, fever, weight loss, weight gain, vision loss, decreased hearing, hoarseness, chest pain, syncope, dyspnea on exertion, peripheral edema, prolonged cough, headaches, hemoptysis, abdominal pain, melena, hematochezia, severe indigestion/heartburn, hematuria, incontinence, genital sores, muscle weakness, suspicious skin lesions, transient blindness, difficulty walking, depression, unusual weight change, abnormal bleeding, enlarged lymph nodes, angioedema, breast masses, and testicular masses.    Physical Exam  General:  Well-developed,well-nourished,in no acute distress; alert,appropriate and cooperative throughout examination Head:  normocephalic and atraumatic.   Eyes:  pupils equal and pupils round.   Ears:  R ear normal and L ear normal.   Neck:  supple and full  ROM.   Lungs:  normal respiratory effort and no wheezes.   Heart:  normal rate, regular rhythm, and Grade 2  /6 systolic ejection murmur.   Abdomen:  soft, normal bowel sounds, and distended.   Msk:  no joint tenderness, no joint swelling, no joint warmth, and decreased ROM.   Extremities:  trace left pedal edema and trace right pedal edema.     Impression & Recommendations:  Problem # 1:  HYPERTENSION (ICD-401.9)  His updated medication list for this problem includes:    Lasix 80 Mg Tabs (Furosemide) ..... Once daily and 1/2 in pm as needed    Azor 10-40 Mg Tabs (Amlodipine-olmesartan) ..... One by mouth daily    Bystolic 10 Mg Tabs (Nebivolol hcl) ..... One by mouth daily  BP today: 140/70 Prior BP: 150/80 (04/20/2010)  Prior 10 Yr Risk Heart Disease: N/A (09/25/2007)  Labs Reviewed: K+: 3.9 (04/06/2010) Creat: : 2.4 (04/06/2010)   Chol: 145 (09/27/2009)   HDL: 57.40 (09/27/2009)   LDL: 65 (09/27/2009)   TG: 113.0 (09/27/2009)  Orders: TLB-BMP (Basic Metabolic Panel-BMET) (80048-METABOL)  Problem # 2:  HYPERLIPIDEMIA (ICD-272.4)  His updated medication list for this problem includes:    Zocor 80 Mg Tabs (Simvastatin) ..... One by mouth daily  Labs Reviewed: SGOT: 14 (09/27/2009)   SGPT: 9 (09/27/2009)  Lipid Goals: Chol Goal: 200 (07/18/2007)   HDL Goal: 40 (07/18/2007)   LDL Goal: 100 (09/25/2007)   TG Goal: 150 (07/18/2007)  Prior 10 Yr Risk Heart Disease: N/A (09/25/2007)   HDL:57.40 (09/27/2009), 47.4 (08/05/2008)  LDL:65 (09/27/2009), 69 (08/05/2008)  Chol:145 (09/27/2009), 137 (08/05/2008)  Trig:113.0 (09/27/2009), 103 (08/05/2008)  Orders: Venipuncture ([address]) TLB-Lipid Panel (80061-LIPID)  Complete Medication List: 1)  Lasix 80 Mg Tabs (Furosemide) .... Once daily and 1/2 in pm as needed 2)  Aspirin 325 Mg Tabs (Aspirin) .... Once daily 3)  Synthroid 200 Mcg Tabs (Levothyroxine sodium) .... Onew by mouth daily 4)  Glyburide 5 Mg Tabs (Glyburide) .Marland Kitchen.. 1 two  times a day 5)  Zocor 80 Mg Tabs (Simvastatin) .... One by mouth daily 6)  Azor 10-40 Mg Tabs (Amlodipine-olmesartan) .... One by mouth daily 7)  Calcitriol 0.25 Mcg Caps (Calcitriol) .... 2 on even days and 1 on odd days 8)  Imdur 60 Mg Xr24h-tab (Isosorbide mononitrate) .... One  tab by mouth once daily 9)  Colcrys 0.6 Mg Tabs (Colchicine) .Marland Kitchen.. 1 two times a day as needed gout 10)  Bystolic 10 Mg Tabs (Nebivolol hcl) .... One by mouth daily 11)  Chlorzoxazone 500 Mg Tabs (Chlorzoxazone) .... One by mouth two times a day as needed muscle spasm 12)  Nitrostat 0.4 Mg Subl (Nitroglycerin) .... Use as directed 13)  Hydrocodone-acetaminophen 5-500 Mg Tabs (Hydrocodone-acetaminophen) .... One by mouth q 6 hours as needed gout pain 14)  Patanol 0.1 % Soln (Olopatadine hcl) .... One drop in each eye two times a day  Other Orders: TLB-CBC Platelet - w/Differential (85025-CBCD) TLB-Hepatic/Liver Function Pnl (80076-HEPATIC)  Hypertension Assessment/Plan:      The patient's hypertensive risk group is category C: Target organ damage and/or diabetes.  Today's blood pressure is 140/70.  His blood pressure goal is < 130/80.  Patient Instructions: 1)  Please schedule a follow-up appointment in 3 months. Prescriptions: SYNTHROID 200 MCG  TABS (LEVOTHYROXINE SODIUM) onew by mouth daily  #30 Tablet x 6   Entered by:   Willy Eddy, LPN   Authorized by:   Stacie Glaze MD   Signed by:   Willy Eddy, LPN on 16/07/9603   Method used:   Electronically to        [facility]  [address] [phone]* (retail)       [address]       [place], [place]  [address]       Ph: [phone] or [phone]       Fax: [phone]   RxID:   [id number]

## 2010-11-17 NOTE — Letter (Signed)
Summary: [facility] - Lip[facility]  [facility], Main Office  [address]   [place], [place] [address]   Phone: [phone]  Fax: [phone]     October 31, 2010 MRN: [id number]   [name] [name] [address] [place], [place]  [address]   Dear Mr. HANSEN,  We have reviewed your cholesterol results.  They are as follows:     Total Cholesterol:    124 (Desirable: less than 200)       HDL  Cholesterol:     44.20  (Desirable: greater than 40 for men and 50 for women)       LDL Cholesterol:       56  (Desirable: less than 100 for low risk and less than 70 for moderate to high risk)       Triglycerides:       117.0  (Desirable: less than 150)  Our recommendations include:These numbers look good. Continue on the same medicine. Liver function is normal. Take care, Dr. Darel Hong.    Call our office at the number listed above if you have any questions.  Lowering your LDL cholesterol is important, but it is only one of a large number of "risk factors" that may indicate that you are at risk for heart disease, stroke or other complications of hardening of the arteries.  Other risk factors include:   A.  Cigarette Smoking* B.  High Blood Pressure* C.  Obesity* D.   Low HDL Cholesterol (see yours above)* E.   Diabetes Mellitus (higher risk if your is uncontrolled) F.  Family history of premature heart disease G.  Previous history of stroke or cardiovascular disease    *These are risk factors YOU HAVE CONTROL OVER.  For more information, visit .  There is now evidence that lowering the TOTAL CHOLESTEROL AND LDL CHOLESTEROL can reduce the risk of heart disease.  The American Heart Association recommends the following guidelines for the treatment of elevated cholesterol:  1.  If there is now current heart disease and less than two risk factors, TOTAL CHOLESTEROL should be less than 200 and LDL CHOLESTEROL should be less than 100. 2.  If there is current heart disease or  two or more risk factors, TOTAL CHOLESTEROL should be less than 200 and LDL CHOLESTEROL should be less than 70.  A diet low in cholesterol, saturated fat, and calories is the cornerstone of treatment for elevated cholesterol.  Cessation of smoking and exercise are also important in the management of elevated cholesterol and preventing vascular disease.  Studies have shown that 30 to 60 minutes of physical activity most days can help lower blood pressure, lower cholesterol, and keep your weight at a healthy level.  Drug therapy is used when cholesterol levels do not respond to therapeutic lifestyle changes (smoking cessation, diet, and exercise) and remains unacceptably high.  If medication is started, it is important to have you levels checked periodically to evaluate the need for further treatment options.  Thank you,    [facility] Team

## 2010-11-17 NOTE — Medication Information (Signed)
Summary: Order for Diabetic Testing Supplies  Order for Diabetic Testing Supplies   Imported By: Maryln Gottron 07/25/2010 10:28:36  _____________________________________________________________________  External Attachment:    Type:   Image     Comment:   External Document

## 2010-11-17 NOTE — Assessment & Plan Note (Signed)
Summary: 1 MONTH ROV/NJR   Vital Signs:  Patient profile:   75 year old male Height:      69 inches Weight:      198 pounds BMI:     29.35 Temp:     98.2 degrees F oral Pulse rate:   68 / minute Resp:     14 per minute BP sitting:   140 / 84  (left arm)  Vitals Entered By: Willy Eddy, LPN (January 04, 2010 11:59 AM) CC: roa-c/o gout flare up, Hypertension Management   CC:  roa-c/o gout flare up and Hypertension Management.  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        Positive major cardiovascular risk factors include male age 59 years old or older, diabetes, hyperlipidemia, and hypertension.  Negative major cardiovascular risk factors include negative family history for ischemic heart disease and non-tobacco-user status.        Positive history for target organ damage include ASHD (either angina/prior MI/prior CABG), cardiac end organ damage (either CHF or LVH), prior stroke (or TIA), and renal insufficiency.  Further assessment for target organ damage reveals no history of peripheral vascular disease.     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) 11)  Hyperkalemia  (ICD-276.7) 12)  Family History Diabetes 1st Degree Relative  (ICD-V18.0) 13)  Hypothyroidism  (ICD-244.9) 14)  Gout  (ICD-274.9) 15)  Hypercholesterolemia  (ICD-272.0)  Current Problems (verified): 1)  Precordial Pain  (ICD-786.51) 2)  Coronary Artery Disease  (ICD-414.00) 3)  Hypertension  (ICD-401.9) 4)  Hyperlipidemia  (ICD-272.4) 5)  Cerebrovascular Accident, Hx of  (ICD-V12.50) 6)  Congestive Heart Failure  (ICD-428.0) 7)  Uns Advrs Eff Uns Rx Medicinal&biological Sbstnc  (ICD-995.20) 8)  Back Pain  (ICD-724.5) 9)  Diabetes Mellitus, Type II  (ICD-250.00) 10)  Chronic Kidney Disease Stage II (MILD)  (ICD-585.2) 11)  Hyperkalemia  (ICD-276.7) 12)  Family History Diabetes 1st Degree Relative  (ICD-V18.0) 13)  Hypothyroidism  (ICD-244.9) 14)  Gout  (ICD-274.9) 15)  Hypercholesterolemia  (ICD-272.0)  Medications Prior to Update: 1)  Lasix 80 Mg  Tabs (Furosemide) .... Once Daily and 1/2 in Pm As Needed 2)  Aspirin 325 Mg  Tabs (Aspirin) .... Once Daily 3)  Synthroid 200 Mcg  Tabs (Levothyroxine Sodium) .... Onew By Mouth Daily 4)  Glyburide 5 Mg  Tabs (Glyburide) .Marland Kitchen.. 1 Two Times A Day 5)  Zocor 80 Mg  Tabs (Simvastatin) .... One By Mouth Daily 6)  Azor 10-40 Mg Tabs (Amlodipine-Olmesartan) .... One By Mouth Daily 7)  Calcitriol 0.25 Mcg  Caps (Calcitriol) .... 2  On Even Days and 1 On Odd Days 8)  Imdur 60 Mg Xr24h-Tab (Isosorbide Mononitrate) .... One  Tab By Mouth Once Daily 9)  Colchicine 0.6 Mg Tabs (Colchicine) .... One By Two Times A Day As Needed Gout 10)  Bystolic 10 Mg Tabs (Nebivolol Hcl) .... One By Mouth Daily 11)  Parafon Forte Dsc 500 Mg Tabs (Chlorzoxazone) .Marland Kitchen.. 1 Three Times A Day As Needed 12)  Tramadol Hcl 50 Mg Tabs (Tramadol Hcl) .Marland Kitchen.. 1 Three Times A Day As Needed Pain 13)  Nitrostat 0.4 Mg Subl (Nitroglycerin) .... Use As Directed 14)  Hydrocodone-Acetaminophen 5-500 Mg Tabs  (Hydrocodone-Acetaminophen) .... One By Mouth Q 6 Hours As Needed Gout Pain  Current Medications (verified): 1)  Lasix 80 Mg  Tabs (Furosemide) .... Once Daily and 1/2 in Pm As Needed 2)  Aspirin 325 Mg  Tabs (Aspirin) .... Once Daily 3)  Synthroid 200 Mcg  Tabs (Levothyroxine Sodium) .... Onew By Mouth Daily 4)  Glyburide 5 Mg  Tabs (Glyburide) .Marland Kitchen.. 1 Two Times A Day 5)  Zocor 80 Mg  Tabs (Simvastatin) .... One By Mouth Daily 6)  Azor 10-40 Mg Tabs (Amlodipine-Olmesartan) .... One By Mouth Daily 7)  Calcitriol 0.25 Mcg  Caps (Calcitriol) .... 2 On Even Days and 1 On Odd Days 8)  Imdur 60 Mg Xr24h-Tab (Isosorbide Mononitrate) .... One  Tab By Mouth Once Daily 9)  Colchicine 0.6 Mg Tabs (Colchicine) .... One By Two Times A Day As Needed Gout 10)  Bystolic 10 Mg Tabs (Nebivolol Hcl) .... One By Mouth Daily 11)  Parafon Forte Dsc 500 Mg Tabs (Chlorzoxazone) .Marland Kitchen.. 1 Three Times A Day As Needed 12)  Tramadol Hcl 50 Mg Tabs (Tramadol Hcl) .Marland Kitchen.. 1 Three Times A Day As Needed Pain 13)  Nitrostat 0.4 Mg Subl (Nitroglycerin) .... Use As Directed 14)  Hydrocodone-Acetaminophen 5-500 Mg Tabs (Hydrocodone-Acetaminophen) .... One By Mouth Q 6 Hours As Needed Gout Pain 15)  Patanol 0.1 % Soln (Olopatadine Hcl) .... One Drop in Each Eye Two Times A Day  Allergies (verified): No Known Drug Allergies  Past History:  Family History: Last updated: 10/31/2007 Family History Lung   father Family History Diabetes 1st degree relative  mother mother with renal failure  Social History: Last updated: 07/18/2007 Retired Married Former Smoker  Risk Factors: Smoking Status: quit (01/04/2010) Packs/Day: 0.5 (01/04/2010) Passive Smoke Exposure: no (01/04/2010)  Past medical, surgical, family and social histories (including risk factors) reviewed, and no changes noted (except as noted below).  Past Medical History: Reviewed history from 09/27/2009 and no changes required. Current Problems:  CORONARY  ARTERY DISEASE (ICD-414.00) ( followed by [facility]) HYPERTENSION (ICD-401.9) HYPERLIPIDEMIA (ICD-272.4) CEREBROVASCULAR ACCIDENT, HX OF (ICD-V12.50) CONGESTIVE HEART FAILURE (ICD-428.0) DIABETES MELLITUS, TYPE II (ICD-250.00) CHRONIC KIDNEY DISEASE STAGE II (MILD) (ICD-585.2) HYPOTHYROIDISM (ICD-244.9) GOUT (ICD-274.9)  Past Surgical History: Reviewed history from 09/27/2009 and no changes required. Thyroidectomy  CO2 aortogram, selective renal angiography.      Family History: Reviewed history from 10/31/2007 and no changes required. Family History Lung   father Family History Diabetes 1st degree relative  mother mother with renal failure  Social History: Reviewed history from 07/18/2007 and no changes required. Retired Married Former Smoker  Review of Systems  The patient denies anorexia, fever, weight loss, weight gain, vision loss, decreased hearing, hoarseness, chest pain, syncope, dyspnea on exertion, peripheral edema, prolonged cough, headaches, hemoptysis, abdominal pain, melena, hematochezia, severe indigestion/heartburn, hematuria, incontinence, genital sores, muscle weakness, suspicious skin lesions, transient blindness, difficulty walking, depression,  unusual weight change, abnormal bleeding, enlarged lymph nodes, angioedema, breast masses, and testicular masses.    Physical Exam  General:  Well-developed,well-nourished,in no acute distress; alert,appropriate and cooperative throughout examination Head:  normocephalic and atraumatic.   Eyes:  pupils equal and pupils round.   Ears:  R ear normal and L ear normal.   Nose:  no external deformity and no nasal discharge.   Mouth:  pharynx pink and moist and no erythema.   Neck:  supple and full ROM.   Lungs:  normal respiratory effort and no wheezes.   Heart:  normal rate, regular rhythm, and Grade 2  /6 systolic ejection murmur.   Abdomen:  soft, normal bowel sounds, and distended.   Msk:  joint tenderness and  joint swelling.   Extremities:  trace left pedal edema and trace right pedal edema.   Neurologic:  alert & oriented X3.     Impression & Recommendations:  Problem # 1:  HYPERTENSION (ICD-401.9)  His updated medication list for this problem includes:    Lasix 80 Mg Tabs (Furosemide) ..... Once daily and 1/2 in pm as needed    Azor 10-40 Mg Tabs (Amlodipine-olmesartan) ..... One by mouth daily    Bystolic 10 Mg Tabs (Nebivolol hcl) ..... One by mouth daily  Problem # 2:  HYPERLIPIDEMIA (ICD-272.4)  His updated medication list for this problem includes:    Zocor 80 Mg Tabs (Simvastatin) ..... One by mouth daily  Labs Reviewed: SGOT: 14 (09/27/2009)   SGPT: 9 (09/27/2009)  Lipid Goals: Chol Goal: 200 (07/18/2007)   HDL Goal: 40 (07/18/2007)   LDL Goal: 100 (09/25/2007)   TG Goal: 150 (07/18/2007)  Prior 10 Yr Risk Heart Disease: N/A (09/25/2007)   HDL:57.40 (09/27/2009), 47.4 (08/05/2008)  LDL:65 (09/27/2009), 69 (08/05/2008)  Chol:145 (09/27/2009), 137 (08/05/2008)  Trig:113.0 (09/27/2009), 103 (08/05/2008)  Complete Medication List: 1)  Lasix 80 Mg Tabs (Furosemide) .... Once daily and 1/2 in pm as needed 2)  Aspirin 325 Mg Tabs (Aspirin) .... Once daily 3)  Synthroid 200 Mcg Tabs (Levothyroxine sodium) .... Onew by mouth daily 4)  Glyburide 5 Mg Tabs (Glyburide) .Marland Kitchen.. 1 two times a day 5)  Zocor 80 Mg Tabs (Simvastatin) .... One by mouth daily 6)  Azor 10-40 Mg Tabs (Amlodipine-olmesartan) .... One by mouth daily 7)  Calcitriol 0.25 Mcg Caps (Calcitriol) .... 2 on even days and 1 on odd days 8)  Imdur 60 Mg Xr24h-tab (Isosorbide mononitrate) .... One  tab by mouth once daily 9)  Colchicine 0.6 Mg Tabs (Colchicine) .... One by two times a day as needed gout 10)  Bystolic 10 Mg Tabs (Nebivolol hcl) .... One by mouth daily 11)  Parafon Forte Dsc 500 Mg Tabs (Chlorzoxazone) .Marland Kitchen.. 1 three times a day as needed 12)  Tramadol Hcl 50 Mg Tabs (Tramadol hcl) .Marland Kitchen.. 1 three times a day as  needed pain 13)  Nitrostat 0.4 Mg Subl (Nitroglycerin) .... Use as directed 14)  Hydrocodone-acetaminophen 5-500 Mg Tabs (Hydrocodone-acetaminophen) .... One by mouth q 6 hours as needed gout pain 15)  Patanol 0.1 % Soln (Olopatadine hcl) .... One drop in each eye two times a day  Other Orders: Venipuncture (40981) TLB-BMP (Basic Metabolic Panel-BMET) (80048-METABOL) TLB-A1C / Hgb A1C (Glycohemoglobin) (83036-A1C) Prescription Created Electronically [phone])  Hypertension Assessment/Plan:      The patient's hypertensive risk group is category C: Target organ damage and/or diabetes.  Today's blood pressure is 140/84.  His blood pressure goal is < 130/80.  Patient Instructions: 1)  Please schedule a follow-up appointment in 3 months. Prescriptions: PATANOL 0.1 % SOLN (OLOPATADINE HCL) one drop in each eye two times a day  #1 unit x 11   Entered and Authorized by:   Stacie Glaze MD   Signed by:   Stacie Glaze MD on 01/04/2010   Method used:   Electronically to        [facility] [address] [phone]* (retail)       [address]       [place], [place]  [address]       Ph: [phone] or [phone]       Fax: [phone]   RxID:   [id number] PATANOL 0.1 % SOLN (OLOPATADINE HCL) one drop in each eye two times a day  #1 unit x 11   Entered and Authorized by:   Stacie Glaze MD   Signed by:   Stacie Glaze MD on 01/04/2010   Method used:   Print then Give to Patient   RxID:   [id number] HYDROCODONE-ACETAMINOPHEN 5-500 MG TABS (HYDROCODONE-ACETAMINOPHEN) one by mouth q 6 hours as needed gout pain  #30 x 0   Entered by:   Willy Eddy, LPN   Authorized by:   Stacie Glaze MD   Signed by:   Willy Eddy, LPN on 51/88/4166   Method used:   Print then Give to Patient   RxID:   [id number] GLYBURIDE 5 MG  TABS (GLYBURIDE) 1 two times a day  #60 x 6   Entered by:   Willy Eddy, LPN   Authorized by:   Stacie Glaze MD   Signed by:   Willy Eddy,  LPN on 32/20/2542   Method used:   Electronically to        [facility] [address] [phone]* (retail)       [address]       [place], [place]  [address]       Ph: [phone] or [phone]       Fax: [phone]   RxID:   [id number]

## 2010-11-23 NOTE — Assessment & Plan Note (Signed)
Summary: 3 month fup//ccm   Vital Signs:  Patient profile:   75 year old male Height:      69 inches Weight:      204 pounds BMI:     30.23 Temp:     98.2 degrees F oral Pulse rate:   64 / minute Resp:     16 per minute BP sitting:   132 / 80  (left arm)  Vitals Entered By: Willy Eddy, LPN (November 14, 2010 8:06 AM) CC: roa- dr [name] added lipitor back, Hypertension Management Is Patient Diabetic? Yes Did you bring your meter with you today? No   Primary Care Provider:  Stacie Glaze MD  CC:  roa- dr [name] added lipitor back and Hypertension Management.  History of Present Illness: The pt resumed lipitor due to the facat that he was an azor which contains amlopdeipine and the fact that he was on 80 of zocar... There is a cost issue with the lipitor and the fact that we do not have samples ( the pts copay is 45 for lipitor) The pt states that his CBGs are in the 100-130 range but he does not check them very often. Blood pressure showes better control and samples are offered to help compliance.    Hypertension History:      He denies headache, chest pain, palpitations, dyspnea with exertion, orthopnea, PND, peripheral edema, visual symptoms, neurologic problems, syncope, and side effects from treatment.        Positive major cardiovascular risk factors include male age 39 years old or older, diabetes, hyperlipidemia, and hypertension.  Negative major cardiovascular risk factors include negative family history for ischemic heart disease and non-tobacco-user status.        Positive history for target organ damage include ASHD (either angina/prior MI/prior CABG), cardiac end organ damage (either CHF or LVH), prior stroke (or TIA), and renal insufficiency.  Further assessment for target organ damage reveals no history of peripheral vascular disease.     Preventive Screening-Counseling & Management  Alcohol-Tobacco     Smoking Status: quit     Packs/Day: 0.5     Year  Started: 1957     Year Quit: 1967     Passive Smoke Exposure: no  Problems Prior to Update: 1)  Loc Osteoarthros Not Spec Prim/sec Lower Leg  (ICD-715.36) 2)  Leg Cramps, Idiopathic  (ICD-729.82) 3)  Precordial Pain  (ICD-786.51) 4)  Coronary Artery Disease  (ICD-414.00) 5)  Hypertension  (ICD-401.9) 6)  Hyperlipidemia  (ICD-272.4) 7)  Cerebrovascular Accident, Hx of  (ICD-V12.50) 8)  Congestive Heart Failure  (ICD-428.0) 9)  Uns Advrs Eff Uns Rx Medicinal&biological Sbstnc  (ICD-995.20) 10)  Back Pain  (ICD-724.5) 11)  Diabetes Mellitus, Type II  (ICD-250.00) 12)  Chronic Kidney Disease Stage II (MILD)  (ICD-585.2) 13)  Hyperkalemia  (ICD-276.7) 14)  Family History Diabetes 1st Degree Relative  (ICD-V18.0) 15)  Hypothyroidism  (ICD-244.9) 16)  Gout  (ICD-274.9) 17)  Hypercholesterolemia  (ICD-272.0)  Current Problems (verified): 1)  Loc Osteoarthros Not Spec Prim/sec Lower Leg  (ICD-715.36) 2)  Leg Cramps, Idiopathic  (ICD-729.82) 3)  Precordial Pain  (ICD-786.51) 4)  Coronary Artery Disease  (ICD-414.00) 5)  Hypertension  (ICD-401.9) 6)  Hyperlipidemia  (ICD-272.4) 7)  Cerebrovascular Accident, Hx of  (ICD-V12.50) 8)  Congestive Heart Failure  (ICD-428.0) 9)  Uns Advrs Eff Uns Rx Medicinal&biological Sbstnc  (ICD-995.20) 10)  Back Pain  (ICD-724.5) 11)  Diabetes Mellitus, Type II  (ICD-250.00) 12)  Chronic Kidney  Disease Stage II (MILD)  (ICD-585.2) 13)  Hyperkalemia  (ICD-276.7) 14)  Family History Diabetes 1st Degree Relative  (ICD-V18.0) 15)  Hypothyroidism  (ICD-244.9) 16)  Gout  (ICD-274.9) 17)  Hypercholesterolemia  (ICD-272.0)  Medications Prior to Update: 1)  Lasix 80 Mg  Tabs (Furosemide) .... Once Daily and 1/2 in Pm As Needed 2)  Aspirin 325 Mg  Tabs (Aspirin) .... Once Daily 3)  Synthroid 200 Mcg  Tabs (Levothyroxine Sodium) .... Onew By Mouth Daily 4)  Glyburide 5 Mg  Tabs (Glyburide) .Marland Kitchen.. 1 Two Times A Day 5)  Lipitor 40 Mg Tabs (Atorvastatin Calcium)  .... Take One Tablet By Mouth Daily. 6)  Azor 10-40 Mg Tabs (Amlodipine-Olmesartan) .... One By Mouth Daily 7)  Calcitriol 0.25 Mcg  Caps (Calcitriol) .... 2 On Even Days and 1 On Odd Days 8)  Imdur 60 Mg Xr24h-Tab (Isosorbide Mononitrate) .... One  Tab By Mouth Once Daily 9)  Colcrys 0.6 Mg Tabs (Colchicine) .Marland Kitchen.. 1 Two Times A Day As Needed Gout 10)  Bystolic 10 Mg Tabs (Nebivolol Hcl) .... One By Mouth Daily 11)  Chlorzoxazone 500 Mg Tabs (Chlorzoxazone) .... One By Mouth Two Times A Day As Needed Muscle Spasm 12)  Nitrostat 0.4 Mg Subl (Nitroglycerin) .... Use As Directed 13)  Hydrocodone-Acetaminophen 5-500 Mg Tabs (Hydrocodone-Acetaminophen) .... One By Mouth Q 6 Hours As Needed Gout Pain 14)  Patanol 0.1 % Soln (Olopatadine Hcl) .... One Drop in Each Eye Two Times A Day 15)  Magnesium 250 Mg Tabs (Magnesium) .... Take 1 By Mouth Once Daily  Current Medications (verified): 1)  Lasix 80 Mg  Tabs (Furosemide) .... Once Daily and 1/2 in Pm As Needed 2)  Aspirin 325 Mg  Tabs (Aspirin) .... Once Daily 3)  Synthroid 200 Mcg  Tabs (Levothyroxine Sodium) .... Onew By Mouth Daily 4)  Glyburide 5 Mg  Tabs (Glyburide) .Marland Kitchen.. 1 Two Times A Day 5)  Crestor 20 Mg Tabs (Rosuvastatin Calcium) .... One By Mouth Daily 6)  Azor 10-40 Mg Tabs (Amlodipine-Olmesartan) .... One By Mouth Daily 7)  Calcitriol 0.25 Mcg  Caps (Calcitriol) .... 2 On Even Days and 1 On Odd Days 8)  Imdur 60 Mg Xr24h-Tab (Isosorbide Mononitrate) .... One  Tab By Mouth Once Daily 9)  Colcrys 0.6 Mg Tabs (Colchicine) .Marland Kitchen.. 1 Two Times A Day As Needed Gout 10)  Bystolic 10 Mg Tabs (Nebivolol Hcl) .... One By Mouth Daily 11)  Chlorzoxazone 500 Mg Tabs (Chlorzoxazone) .... One By Mouth Two Times A Day As Needed Muscle Spasm 12)  Nitrostat 0.4 Mg Subl (Nitroglycerin) .... Use As Directed 13)  Hydrocodone-Acetaminophen 5-500 Mg Tabs (Hydrocodone-Acetaminophen) .... One By Mouth Q 6 Hours As Needed Gout Pain 14)  Patanol 0.1 % Soln  (Olopatadine Hcl) .... One Drop in Each Eye Two Times A Day 15)  Magnesium 250 Mg Tabs (Magnesium) .... Take 1 By Mouth Once Daily  Allergies (verified): No Known Drug Allergies  Past History:  Family History: Last updated: 10/31/2007 Family History Lung   father Family History Diabetes 1st degree relative  mother mother with renal failure  Social History: Last updated: 07/18/2007 Retired Married Former Smoker  Risk Factors: Smoking Status: quit (11/14/2010) Packs/Day: 0.5 (11/14/2010) Passive Smoke Exposure: no (11/14/2010)  Past medical, surgical, family and social histories (including risk factors) reviewed, and no changes noted (except as noted below).  Past Medical History: Reviewed history from 09/26/2010 and no changes required. CORONARY ARTERY DISEASE (ICD-414.00) ( followed by [facility]) HYPERTENSION (ICD-401.9) HYPERLIPIDEMIA (ICD-272.4)  CEREBROVASCULAR ACCIDENT, HX OF (ICD-V12.50) CONGESTIVE HEART FAILURE (ICD-428.0) DIABETES MELLITUS, TYPE II (ICD-250.00) CHRONIC KIDNEY DISEASE STAGE II (MILD) (ICD-585.2) HYPOTHYROIDISM (ICD-244.9) GOUT (ICD-274.9)  Past Surgical History: Reviewed history from 09/27/2009 and no changes required. Thyroidectomy  CO2 aortogram, selective renal angiography.      Family History: Reviewed history from 10/31/2007 and no changes required. Family History Lung   father Family History Diabetes 1st degree relative  mother mother with renal failure  Social History: Reviewed history from 07/18/2007 and no changes required. Retired Married Former Smoker  Review of Systems  The patient denies anorexia, fever, weight loss, weight gain, vision loss, decreased hearing, hoarseness, chest pain, syncope, dyspnea on exertion, peripheral edema, prolonged cough, headaches, hemoptysis, abdominal pain, melena, hematochezia, severe indigestion/heartburn, hematuria, incontinence, genital sores, muscle weakness, suspicious skin lesions,  transient blindness, difficulty walking, depression, unusual weight change, abnormal bleeding, enlarged lymph nodes, angioedema, and breast masses.    Physical Exam  General:  Well-developed,well-nourished,in no acute distress; alert,appropriate and cooperative throughout examination Head:  normocephalic and atraumatic.   Eyes:  pupils equal and pupils round.   Ears:  R ear normal and L ear normal.   Nose:  no external deformity and no nasal discharge.   Mouth:  pharynx pink and moist and no erythema.   Neck:  supple and full ROM.   Lungs:  normal respiratory effort and no wheezes.   Heart:  normal rate, regular rhythm, and Grade 2  /6 systolic ejection murmur.   Abdomen:  soft, normal bowel sounds, and distended.   Msk:  no joint tenderness, no joint swelling, no joint warmth, and decreased ROM.   Extremities:  trace left pedal edema and trace right pedal edema.   Neurologic:  alert & oriented X3.    Diabetes Management Exam:    Foot Exam (with socks and/or shoes not present):       Sensory-Pinprick/Light touch:          Left medial foot (L-4): diminished          Left dorsal foot (L-5): diminished          Left lateral foot (S-1): diminished          Right medial foot (L-4): diminished          Right dorsal foot (L-5): diminished          Right lateral foot (S-1): diminished   Impression & Recommendations:  Problem # 1:  HYPERTENSION (ICD-401.9) Assessment Improved  the kep to is control if adherance and samples help this as his funds are limited His updated medication list for this problem includes:    Lasix 80 Mg Tabs (Furosemide) ..... Once daily and 1/2 in pm as needed    Azor 10-40 Mg Tabs (Amlodipine-olmesartan) ..... One by mouth daily    Bystolic 10 Mg Tabs (Nebivolol hcl) ..... One by mouth daily  BP today: 132/80 Prior BP: 172/80 (09/26/2010)  Prior 10 Yr Risk Heart Disease: N/A (09/25/2007)  Labs Reviewed: K+: 4.2 (08/15/2010) Creat: : 2.0 (08/15/2010)    Chol: 124 (10/28/2010)   HDL: 44.20 (10/28/2010)   LDL: 56 (10/28/2010)   TG: 117.0 (10/28/2010)  Problem # 2:  HYPERLIPIDEMIA (ICD-272.4) Assessment: Improved see leter from cardiology note sent to his cardiologist reguarding the medicaton changes His updated medication list for this problem includes:    Crestor 20 Mg Tabs (Rosuvastatin calcium) ..... One by mouth daily  Labs Reviewed: SGOT: 18 (10/28/2010)   SGPT: 14 (10/28/2010)  Lipid Goals: Chol  Goal: 200 (07/18/2007)   HDL Goal: 40 (07/18/2007)   LDL Goal: 100 (09/25/2007)   TG Goal: 150 (07/18/2007)  Prior 10 Yr Risk Heart Disease: N/A (09/25/2007)   HDL:44.20 (10/28/2010), 44.80 (08/15/2010)  LDL:56 (10/28/2010), 65 (09/27/2009)  Chol:124 (10/28/2010), 124 (08/15/2010)  Trig:117.0 (10/28/2010), 233.0 (05/05/2010)  Problem # 3:  DIABETES MELLITUS, TYPE II (ICD-250.00) Assessment: Unchanged  will refer for eye exam check A!C His updated medication list for this problem includes:    Aspirin 325 Mg Tabs (Aspirin) ..... Once daily    Glyburide 5 Mg Tabs (Glyburide) .Marland Kitchen... 1 two times a day    Azor 10-40 Mg Tabs (Amlodipine-olmesartan) ..... One by mouth daily  Labs Reviewed: Creat: 2.0 (08/15/2010)    Reviewed HgBA1c results: 7.8 (08/15/2010)  8.0 (04/06/2010)  Orders: TLB-A1C / Hgb A1C (Glycohemoglobin) (83036-A1C) Ophthalmology Referral (Ophthalmology)  Problem # 4:  CHRONIC KIDNEY DISEASE STAGE II (MILD) (ICD-585.2) Assessment: Unchanged creatinine stable at 2 Orders: Venipuncture (16109) TLB-BMP (Basic Metabolic Panel-BMET) (80048-METABOL)  Labs Reviewed: BUN: 42 (08/15/2010)   Cr: 2.0 (08/15/2010)    Hgb: 13.3 (05/05/2010)   Hct: 38.9 (05/05/2010)   Ca++: 8.6 (08/15/2010)    TP: 6.6 (10/28/2010)   Alb: 3.4 (10/28/2010)  Problem # 5:  GOUT (ICD-274.9)  His updated medication list for this problem includes:    Colcrys 0.6 Mg Tabs (Colchicine) .Marland Kitchen... 1 two times a day as needed gout  Orders: TLB-Uric Acid,  Blood (84550-URIC)  Elevate extremity; warm compresses, symptomatic relief and medication as directed.   Complete Medication List: 1)  Lasix 80 Mg Tabs (Furosemide) .... Once daily and 1/2 in pm as needed 2)  Aspirin 325 Mg Tabs (Aspirin) .... Once daily 3)  Synthroid 200 Mcg Tabs (Levothyroxine sodium) .... Onew by mouth daily 4)  Glyburide 5 Mg Tabs (Glyburide) .Marland Kitchen.. 1 two times a day 5)  Crestor 20 Mg Tabs (Rosuvastatin calcium) .... One by mouth daily 6)  Azor 10-40 Mg Tabs (Amlodipine-olmesartan) .... One by mouth daily 7)  Calcitriol 0.25 Mcg Caps (Calcitriol) .... 2 on even days and 1 on odd days 8)  Imdur 60 Mg Xr24h-tab (Isosorbide mononitrate) .... One  tab by mouth once daily 9)  Colcrys 0.6 Mg Tabs (Colchicine) .Marland Kitchen.. 1 two times a day as needed gout 10)  Bystolic 10 Mg Tabs (Nebivolol hcl) .... One by mouth daily 11)  Chlorzoxazone 500 Mg Tabs (Chlorzoxazone) .... One by mouth two times a day as needed muscle spasm 12)  Nitrostat 0.4 Mg Subl (Nitroglycerin) .... Use as directed 13)  Hydrocodone-acetaminophen 5-500 Mg Tabs (Hydrocodone-acetaminophen) .... One by mouth q 6 hours as needed gout pain 14)  Patanol 0.1 % Soln (Olopatadine hcl) .... One drop in each eye two times a day 15)  Magnesium 250 Mg Tabs (Magnesium) .... Take 1 by mouth once daily  Other Orders: TLB-TSH (Thyroid Stimulating Hormone) (84443-TSH)  Hypertension Assessment/Plan:      The patient's hypertensive risk group is category C: Target organ damage and/or diabetes.  Today's blood pressure is 132/80.  His blood pressure goal is < 130/80.  Patient Instructions: 1)  you need an eye exam 2)  Please schedule a follow-up appointment in 4 months. Prescriptions: HYDROCODONE-ACETAMINOPHEN 5-500 MG TABS (HYDROCODONE-ACETAMINOPHEN) one by mouth q 6 hours as needed gout pain  #30 x 2   Entered and Authorized by:   Stacie Glaze MD   Signed by:   Stacie Glaze MD on 11/14/2010   Method used:   Print then Give  to  Patient   RxID:   [id number]    Orders Added: 1)  Est. Patient Level IV [95621] 2)  Venipuncture [36415] 3)  TLB-BMP (Basic Metabolic Panel-BMET) [80048-METABOL] 4)  TLB-A1C / Hgb A1C (Glycohemoglobin) [83036-A1C] 5)  TLB-TSH (Thyroid Stimulating Hormone) [84443-TSH] 6)  TLB-Uric Acid, Blood [84550-URIC] 7)  Ophthalmology Referral [Ophthalmology]  Appended Document: Orders Update     Clinical Lists Changes  Orders: Added new Service order of Specimen Handling ([id number]) - Signed

## 2010-12-12 ENCOUNTER — Emergency Department (HOSPITAL_COMMUNITY): Payer: Medicare Other

## 2010-12-12 ENCOUNTER — Inpatient Hospital Stay (HOSPITAL_COMMUNITY)
Admission: EM | Admit: 2010-12-12 | Discharge: 2010-12-27 | DRG: 682 | Disposition: A | Discharge status: Home Health | Payer: Medicare Other | Attending: Internal Medicine | Admitting: Internal Medicine

## 2010-12-12 ENCOUNTER — Other Ambulatory Visit (HOSPITAL_COMMUNITY): Payer: PRIVATE HEALTH INSURANCE

## 2010-12-12 ENCOUNTER — Inpatient Hospital Stay (HOSPITAL_COMMUNITY): Payer: Medicare Other

## 2010-12-12 DIAGNOSIS — IMO0002 Reserved for concepts with insufficient information to code with codable children: Secondary | ICD-10-CM | POA: Diagnosis present

## 2010-12-12 DIAGNOSIS — D72829 Elevated white blood cell count, unspecified: Secondary | ICD-10-CM | POA: Diagnosis present

## 2010-12-12 DIAGNOSIS — E1165 Type 2 diabetes mellitus with hyperglycemia: Secondary | ICD-10-CM | POA: Diagnosis present

## 2010-12-12 DIAGNOSIS — N184 Chronic kidney disease, stage 4 (severe): Secondary | ICD-10-CM | POA: Diagnosis present

## 2010-12-12 DIAGNOSIS — N390 Urinary tract infection, site not specified: Secondary | ICD-10-CM | POA: Diagnosis present

## 2010-12-12 DIAGNOSIS — A498 Other bacterial infections of unspecified site: Secondary | ICD-10-CM | POA: Diagnosis present

## 2010-12-12 DIAGNOSIS — E039 Hypothyroidism, unspecified: Secondary | ICD-10-CM | POA: Diagnosis present

## 2010-12-12 DIAGNOSIS — I509 Heart failure, unspecified: Secondary | ICD-10-CM | POA: Diagnosis present

## 2010-12-12 DIAGNOSIS — N170 Acute kidney failure with tubular necrosis: Principal | ICD-10-CM | POA: Diagnosis present

## 2010-12-12 DIAGNOSIS — J9600 Acute respiratory failure, unspecified whether with hypoxia or hypercapnia: Secondary | ICD-10-CM | POA: Diagnosis present

## 2010-12-12 DIAGNOSIS — R5381 Other malaise: Secondary | ICD-10-CM | POA: Diagnosis present

## 2010-12-12 DIAGNOSIS — I129 Hypertensive chronic kidney disease with stage 1 through stage 4 chronic kidney disease, or unspecified chronic kidney disease: Secondary | ICD-10-CM | POA: Diagnosis present

## 2010-12-12 DIAGNOSIS — Z8673 Personal history of transient ischemic attack (TIA), and cerebral infarction without residual deficits: Secondary | ICD-10-CM

## 2010-12-12 DIAGNOSIS — M109 Gout, unspecified: Secondary | ICD-10-CM | POA: Diagnosis present

## 2010-12-12 DIAGNOSIS — I5043 Acute on chronic combined systolic (congestive) and diastolic (congestive) heart failure: Secondary | ICD-10-CM | POA: Diagnosis present

## 2010-12-12 DIAGNOSIS — E860 Dehydration: Secondary | ICD-10-CM | POA: Diagnosis present

## 2010-12-12 LAB — GLUCOSE, CAPILLARY
Glucose-Capillary: 126 mg/dL — ABNORMAL HIGH (ref 70–99)
Glucose-Capillary: 166 mg/dL — ABNORMAL HIGH (ref 70–99)

## 2010-12-12 LAB — BASIC METABOLIC PANEL
BUN: 65 mg/dL — ABNORMAL HIGH (ref 6–23)
Calcium: 9.1 mg/dL (ref 8.4–10.5)
GFR calc non Af Amer: 18 mL/min — ABNORMAL LOW (ref >60)
Glucose, Bld: 134 mg/dL — ABNORMAL HIGH (ref 70–99)
Potassium: 4.3 mEq/L (ref 3.5–5.1)

## 2010-12-12 LAB — DIFFERENTIAL
Basophils Absolute: 0 10*3/uL (ref 0.0–0.1)
Eosinophils Absolute: 0.3 10*3/uL (ref 0.0–0.7)
Eosinophils Relative: 4 % (ref 0–5)
Lymphocytes Relative: 12 % (ref 12–46)
Lymphs Abs: 0.8 10*3/uL (ref 0.7–4.0)
Monocytes Absolute: 0.8 10*3/uL (ref 0.1–1.0)

## 2010-12-12 LAB — APTT: aPTT: 35 seconds (ref 24–37)

## 2010-12-12 LAB — CBC
HCT: 37.7 % — ABNORMAL LOW (ref 39.0–52.0)
HCT: 38.5 % — ABNORMAL LOW (ref 39.0–52.0)
Hemoglobin: 12.4 g/dL — ABNORMAL LOW (ref 13.0–17.0)
MCH: 30.3 pg (ref 26.0–34.0)
MCHC: 31.8 g/dL (ref 30.0–36.0)
MCV: 93.1 fL (ref 78.0–100.0)
MCV: 94.1 fL (ref 78.0–100.0)
Platelets: 174 10*3/uL (ref 150–400)
Platelets: 176 10*3/uL (ref 150–400)
RBC: 4.09 MIL/uL — ABNORMAL LOW (ref 4.22–5.81)
RDW: 12.7 % (ref 11.5–15.5)
WBC: 6.5 10*3/uL (ref 4.0–10.5)
WBC: 5.8 10*3/uL (ref 4.0–10.5)

## 2010-12-12 LAB — URINALYSIS, ROUTINE W REFLEX MICROSCOPIC
Bilirubin Urine: NEGATIVE
Ketones, ur: NEGATIVE mg/dL
Leukocytes, UA: NEGATIVE
Nitrite: NEGATIVE
Protein, ur: 100 mg/dL — AB
Urobilinogen, UA: 0.2 mg/dL (ref 0.0–1.0)

## 2010-12-12 LAB — COMPREHENSIVE METABOLIC PANEL
ALT: 11 U/L (ref 0–53)
AST: 14 U/L (ref 0–37)
Albumin: 3.7 g/dL (ref 3.5–5.2)
Chloride: 114 mEq/L — ABNORMAL HIGH (ref 96–112)
Creatinine, Ser: 3.22 mg/dL — ABNORMAL HIGH (ref 0.4–1.5)
GFR calc Af Amer: 23 mL/min — ABNORMAL LOW (ref >60)
Sodium: 141 mEq/L (ref 135–145)
Total Bilirubin: 0.7 mg/dL (ref 0.3–1.2)

## 2010-12-12 LAB — CARDIAC PANEL(CRET KIN+CKTOT+MB+TROPI)
CK, MB: 1.4 ng/mL (ref 0.3–4.0)
Total CK: 81 U/L (ref 7–232)

## 2010-12-12 LAB — URINE MICROSCOPIC-ADD ON

## 2010-12-12 NOTE — H&P (Signed)
NAME:  [name] [name], [name] [name] NO.:  [id number]  MEDICAL RECORD NO.:  [id number]           PATIENT TYPE:  E  LOCATION:  [facility]                         FACILITY:  [facility]  PHYSICIAN:  Talmage Nap, MD  DATE OF BIRTH:  1936/05/27  DATE OF ADMISSION:  12/12/2010 DATE OF DISCHARGE:                             HISTORY & PHYSICAL   PRIMARY CARE PHYSICIAN:  Stacie Glaze, MD.  CARDIOLOGIST:  Madolyn Frieze. [name], MD, [facility].  NEPHROLOGIST:  Dyke Maes, MD  History obtainable from the patient and the patient's spouse.  CHIEF COMPLAINT:  Dizzy spell and unsteady gait of one day's duration.  HISTORY OF PRESENT ILLNESS:  The patient is a 75 year old African- American male with a history of coronary artery disease status post cardiac cath plus stent, prior CVA, presently no neuro deficit and acute on chronic kidney disease, being followed by the nephrologist presenting to the emergency room with one day history of dizzy spell with unsteady gait.  At baseline, the patient ambulates without any walker, however, a day prior to presenting to the emergency room, he noticed he was very dizzy and his vision was very blurry.  At the same time, his gait was unsteady and was swaying from side to side.  He denied any history of nausea or vomiting.  He denied any chest pain or shortness of breath. He denied any slurred speech.  He denied any numbness on any side of the body.  He denied any fever.  He denied any chills.  He denied any rigor. Symptom was said to have persisted all through the night, hence the patient presented through the emergency room to be evaluated today.  PAST MEDICAL HISTORY: 1. Positive for coronary artery disease. 2. Hypertension. 3. Diabetes mellitus. 4. Gout. 5. Hypercholesterolemia. 6. Hypothyroidism. 7. Prior CVA. 8. Chronic kidney disease.  PAST SURGICAL HISTORY:  Coronary artery disease status post cardiac cath plus stent.  MEDICATIONS:   Preadmission meds include, 1. Aspirin 325 mg p.o. daily. 2. Azor 10/40 one p.o. daily. 3. Bystolic 10 mg one p.o. daily. 4. Calcitriol. 5. Chlorzoxazone. 6. Colcrys. 7. Crestor. 8. Glyburide. 9. Hydrocodone/acetaminophen. 10.Imdur. 11.Lasix. 12.Magnesium. 13.Nitroglycerin sublingual. 14.Patanol ophthalmic. 15.Synthroid.  ALLERGIES:  He has no known allergies.  SOCIAL HISTORY:  Negative for alcohol or tobacco use.  The patient is a businessman.  FAMILY HISTORY:  York Spaniel to be positive for coronary artery disease.  REVIEW OF SYSTEMS:  The patient presently denies any history of headaches.  No blurred vision.  He is complaining about dizzy spell.  No nausea or vomiting.  No fever.  No chills.  No rigor.  No chest pain. No shortness of breath.  Denies any cough.  No abdominal discomfort.  No diarrhea or hematochezia.  No dysuria or hematuria.  No swelling of the lower extremity.  No intolerance to heat or cold.  No neuropsychiatric disorder.  PHYSICAL EXAMINATION:  GENERAL:  Elderly man, dehydrated, not in any obvious respiratory distress. VITAL SIGNS:  Blood pressure is 131/61, pulse is 66, respiratory rate is 24, temperature is 97.6. HEENT:  Slight proptosis, but pupils are reactive to light and extraocular  muscles are intact. NECK:  He has no jugular venous distention.  No carotid bruit.  No lymphadenopathy. CHEST:  Clear to auscultation. CARDIOVASCULAR:  Heart sounds are 1 and 2. ABDOMEN:  Soft and nontender.  Liver, spleen, and kidney not palpable. Bowel sounds are positive. EXTREMITIES:  Showed trace edema. NEUROLOGIC:  Shows muscle power in right upper extremity 5/5, right lower extremity 5/5, left upper extremity 5/5, left lower extremity 5/5. Sensory sensation intact.  Deep daily tendon reflexes 2/4 in upper and lower extremities. MUSCULOSKELETAL:  Show arthritic changes in the knees and in the feet. NEUROPSYCHIATRIC:  Unremarkable. SKIN:  Showed decreased  turgor.  LABORATORY DATA:  Initial chemistry showed sodium of 141, potassium of 4.3, chloride of 114 with a bicarb of 20, glucose is 134, BUN is 65, creatinine is 3.34.  Urinalysis unremarkable.  Urine microscopy unremarkable.  Hematological indices showed WBC of 6.5, hemoglobin of 12.0, hematocrit of 37.7, MCV of 93.1 with a platelet count of 174. Normal differential.  EKG showed sinus bradycardia with rate of 56 with nonspecific T-wave changes in anterolateral leads.  CT of the head without contrast showed small focus of stable remote ischemic changes of the right frontal lobe.  ADMITTING IMPRESSION: 1. Dizziness/unsteady gait, questionable vertebrobasilar     insufficiency, rule out brain stem cerebrovascular accident. 2. Dehydration. 3. Acute on chronic kidney disease. 4. Prior cerebrovascular accident. 5. Coronary artery disease status post cardiac cath plus stent. 6. Hypolipidemia. 7. Diabetes mellitus. 8. Gout. 9. Hypothyroidism.  PLAN:  To admit the patient to Neuro Telemetry.  Thus,  will follow strictly stroke admission orders and part of it will include cardiac enzymes q.6 x3.  CBC, CMP, and magnesium will be repeated in a.m. Imaging studies will be done.  We will also follow strictly the stroke admission orders and to also include a carotid duplex, 2-D echo, MRI and MRA of the brain.  In the meantime, the patient will be on normal saline IV to go at rate of 75 mL an hour, aspirin 325 mg p.o. daily, Azor 10/40 one p.o. daily, Bystolic 10 mg p.o. daily, Crestor 10 mg p.o. daily, glyburide 10 mg p.o. daily.  The patient will be on Accu-Chek t.i.d. with a.c. and h.s. with regular insulin sliding scale (moderate scale). The patient will be on Protonix 40 mg IV q.24 for GI prophylaxis and Lovenox 40 mg subcu q.24 for DVT prophylaxis.  The patient will be reevaluated with lab results, and he will be followed and evaluated on day-to-day basis.     Talmage Nap,  MD     CN/MEDQ  D:  12/12/2010  T:  12/12/2010  Job:  [id number]  Electronically Signed by Talmage Nap  on 12/12/2010 04:09:30 PM

## 2010-12-13 ENCOUNTER — Inpatient Hospital Stay (HOSPITAL_COMMUNITY): Payer: Medicare Other

## 2010-12-13 LAB — COMPREHENSIVE METABOLIC PANEL
ALT: 9 U/L (ref 0–53)
AST: 13 U/L (ref 0–37)
Alkaline Phosphatase: 33 U/L — ABNORMAL LOW (ref 39–117)
CO2: 19 mEq/L (ref 19–32)
GFR calc Af Amer: 21 mL/min — ABNORMAL LOW (ref >60)
Glucose, Bld: 102 mg/dL — ABNORMAL HIGH (ref 70–99)
Potassium: 4.7 mEq/L (ref 3.5–5.1)
Sodium: 140 mEq/L (ref 135–145)
Total Protein: 5.9 g/dL — ABNORMAL LOW (ref 6.0–8.3)

## 2010-12-13 LAB — LIPID PANEL
Cholesterol: 101 mg/dL (ref 0–200)
HDL: 42 mg/dL (ref >39)
Total CHOL/HDL Ratio: 2.4 RATIO

## 2010-12-13 LAB — MAGNESIUM: Magnesium: 2 mg/dL (ref 1.5–2.5)

## 2010-12-13 LAB — BLOOD GAS, ARTERIAL
Acid-base deficit: 7.5 mmol/L — ABNORMAL HIGH (ref 0.0–2.0)
Bicarbonate: 17.7 mEq/L — ABNORMAL LOW (ref 20.0–24.0)
Drawn by: 332341
FIO2: 0.5 %
O2 Saturation: 91.5 %
TCO2: 18.8 mmol/L (ref 0–100)
pCO2 arterial: 36.6 mmHg (ref 35.0–45.0)
pO2, Arterial: 68.3 mmHg — ABNORMAL LOW (ref 80.0–100.0)

## 2010-12-13 LAB — CBC
Platelets: 150 10*3/uL (ref 150–400)
RBC: 3.5 MIL/uL — ABNORMAL LOW (ref 4.22–5.81)
WBC: 5.9 10*3/uL (ref 4.0–10.5)

## 2010-12-13 LAB — MRSA PCR SCREENING
MRSA by PCR: NEGATIVE
MRSA by PCR: NEGATIVE

## 2010-12-13 LAB — CARDIAC PANEL(CRET KIN+CKTOT+MB+TROPI)
CK, MB: 1.2 ng/mL (ref 0.3–4.0)
Relative Index: INVALID (ref 0.0–2.5)
Total CK: 83 U/L (ref 7–232)
Total CK: 86 U/L (ref 7–232)
Troponin I: 0.01 ng/mL (ref 0.00–0.06)

## 2010-12-13 LAB — FERRITIN: Ferritin: 373 ng/mL — ABNORMAL HIGH (ref 22–322)

## 2010-12-13 LAB — FOLATE: Folate: 4.3 ng/mL

## 2010-12-13 LAB — IRON AND TIBC
Iron: 28 ug/dL — ABNORMAL LOW (ref 42–135)
TIBC: 246 ug/dL (ref 215–435)

## 2010-12-13 LAB — BRAIN NATRIURETIC PEPTIDE: Pro B Natriuretic peptide (BNP): 1307 pg/mL — ABNORMAL HIGH (ref 0.0–100.0)

## 2010-12-13 LAB — VITAMIN B12: Vitamin B-12: 216 pg/mL (ref 211–911)

## 2010-12-13 MED ORDER — TECHNETIUM TO 99M ALBUMIN AGGREGATED
3.0000 | Freq: Once | INTRAVENOUS | Status: AC | PRN
  Administered 2010-12-13: 3 via INTRAVENOUS

## 2010-12-13 MED ORDER — XENON XE 133 GAS
10.0000 | GAS_FOR_INHALATION | Freq: Once | RESPIRATORY_TRACT | Status: AC | PRN
  Administered 2010-12-13: 10 via RESPIRATORY_TRACT

## 2010-12-14 ENCOUNTER — Inpatient Hospital Stay (HOSPITAL_COMMUNITY): Payer: Medicare Other

## 2010-12-14 LAB — CSF CELL COUNT WITH DIFFERENTIAL
RBC Count, CSF: 30000 /mm3 — ABNORMAL HIGH
WBC, CSF: 6 /mm3 — ABNORMAL HIGH (ref 0–5)

## 2010-12-14 LAB — GLUCOSE, CAPILLARY
Glucose-Capillary: 141 mg/dL — ABNORMAL HIGH (ref 70–99)
Glucose-Capillary: 111 mg/dL — ABNORMAL HIGH (ref 70–99)
Glucose-Capillary: 63 mg/dL — ABNORMAL LOW (ref 70–99)
Glucose-Capillary: 208 mg/dL — ABNORMAL HIGH (ref 70–99)

## 2010-12-14 LAB — COMPREHENSIVE METABOLIC PANEL
AST: 13 U/L (ref 0–37)
BUN: 70 mg/dL — ABNORMAL HIGH (ref 6–23)
CO2: 19 mEq/L (ref 19–32)
Calcium: 9 mg/dL (ref 8.4–10.5)
Chloride: 112 mEq/L (ref 96–112)
Creatinine, Ser: 3.21 mg/dL — ABNORMAL HIGH (ref 0.4–1.5)
GFR calc Af Amer: 23 mL/min — ABNORMAL LOW (ref >60)
GFR calc non Af Amer: 19 mL/min — ABNORMAL LOW (ref >60)
Glucose, Bld: 112 mg/dL — ABNORMAL HIGH (ref 70–99)
Total Bilirubin: 0.8 mg/dL (ref 0.3–1.2)

## 2010-12-15 ENCOUNTER — Inpatient Hospital Stay (HOSPITAL_COMMUNITY): Payer: Medicare Other

## 2010-12-15 LAB — URINE CULTURE: Colony Count: 80000

## 2010-12-15 LAB — GLUCOSE, CAPILLARY
Glucose-Capillary: 104 mg/dL — ABNORMAL HIGH (ref 70–99)
Glucose-Capillary: 264 mg/dL — ABNORMAL HIGH (ref 70–99)
Glucose-Capillary: 307 mg/dL — ABNORMAL HIGH (ref 70–99)

## 2010-12-15 LAB — BASIC METABOLIC PANEL
Calcium: 9.1 mg/dL (ref 8.4–10.5)
GFR calc Af Amer: 25 mL/min — ABNORMAL LOW (ref >60)
GFR calc non Af Amer: 20 mL/min — ABNORMAL LOW (ref >60)
Glucose, Bld: 241 mg/dL — ABNORMAL HIGH (ref 70–99)
Potassium: 4.6 mEq/L (ref 3.5–5.1)
Sodium: 140 mEq/L (ref 135–145)

## 2010-12-16 DIAGNOSIS — I059 Rheumatic mitral valve disease, unspecified: Secondary | ICD-10-CM

## 2010-12-16 LAB — BASIC METABOLIC PANEL
CO2: 19 mEq/L (ref 19–32)
Calcium: 9 mg/dL (ref 8.4–10.5)
GFR calc Af Amer: 20 mL/min — ABNORMAL LOW (ref >60)
GFR calc non Af Amer: 16 mL/min — ABNORMAL LOW (ref >60)
Glucose, Bld: 289 mg/dL — ABNORMAL HIGH (ref 70–99)
Potassium: 4.9 mEq/L (ref 3.5–5.1)
Sodium: 137 mEq/L (ref 135–145)

## 2010-12-16 LAB — GLUCOSE, CAPILLARY
Glucose-Capillary: 206 mg/dL — ABNORMAL HIGH (ref 70–99)
Glucose-Capillary: 290 mg/dL — ABNORMAL HIGH (ref 70–99)
Glucose-Capillary: 285 mg/dL — ABNORMAL HIGH (ref 70–99)

## 2010-12-16 LAB — HERPES SIMPLEX VIRUS(HSV) DNA BY PCR: HSV 2 DNA: NOT DETECTED

## 2010-12-17 LAB — CSF CULTURE W GRAM STAIN: Culture: NO GROWTH

## 2010-12-17 LAB — BASIC METABOLIC PANEL
CO2: 18 mEq/L — ABNORMAL LOW (ref 19–32)
Chloride: 101 mEq/L (ref 96–112)
GFR calc Af Amer: 15 mL/min — ABNORMAL LOW (ref >60)
Potassium: 4.7 mEq/L (ref 3.5–5.1)

## 2010-12-17 LAB — GLUCOSE, CAPILLARY: Glucose-Capillary: 277 mg/dL — ABNORMAL HIGH (ref 70–99)

## 2010-12-17 LAB — VANCOMYCIN, TROUGH: Vancomycin Tr: 29.2 ug/mL (ref 10.0–20.0)

## 2010-12-18 ENCOUNTER — Inpatient Hospital Stay (HOSPITAL_COMMUNITY): Payer: Medicare Other

## 2010-12-18 LAB — CBC
Hemoglobin: 10.8 g/dL — ABNORMAL LOW (ref 13.0–17.0)
MCH: 29.8 pg (ref 26.0–34.0)
MCHC: 33 g/dL (ref 30.0–36.0)
MCV: 90.3 fL (ref 78.0–100.0)
RBC: 3.62 MIL/uL — ABNORMAL LOW (ref 4.22–5.81)

## 2010-12-18 LAB — GLUCOSE, CAPILLARY
Glucose-Capillary: 402 mg/dL — ABNORMAL HIGH (ref 70–99)
Glucose-Capillary: 353 mg/dL — ABNORMAL HIGH (ref 70–99)

## 2010-12-18 LAB — BASIC METABOLIC PANEL
BUN: 138 mg/dL — ABNORMAL HIGH (ref 6–23)
CO2: 19 mEq/L (ref 19–32)
Calcium: 8.1 mg/dL — ABNORMAL LOW (ref 8.4–10.5)
Chloride: 100 mEq/L (ref 96–112)
Creatinine, Ser: 5.86 mg/dL — ABNORMAL HIGH (ref 0.4–1.5)
GFR calc Af Amer: 12 mL/min — ABNORMAL LOW (ref >60)
Glucose, Bld: 298 mg/dL — ABNORMAL HIGH (ref 70–99)

## 2010-12-19 LAB — CBC
MCH: 30.2 pg (ref 26.0–34.0)
MCHC: 33.7 g/dL (ref 30.0–36.0)
MCV: 89.6 fL (ref 78.0–100.0)
Platelets: 190 10*3/uL (ref 150–400)
RBC: 3.64 MIL/uL — ABNORMAL LOW (ref 4.22–5.81)

## 2010-12-19 LAB — GLUCOSE, CAPILLARY: Glucose-Capillary: 294 mg/dL — ABNORMAL HIGH (ref 70–99)

## 2010-12-19 LAB — CULTURE, BLOOD (ROUTINE X 2)
Culture  Setup Time: 201202280858
Culture: NO GROWTH

## 2010-12-19 LAB — BASIC METABOLIC PANEL
BUN: 149 mg/dL — ABNORMAL HIGH (ref 6–23)
Calcium: 7.3 mg/dL — ABNORMAL LOW (ref 8.4–10.5)
Chloride: 99 mEq/L (ref 96–112)
Creatinine, Ser: 6.75 mg/dL — ABNORMAL HIGH (ref 0.4–1.5)
GFR calc Af Amer: 10 mL/min — ABNORMAL LOW (ref >60)

## 2010-12-20 LAB — CBC
HCT: 33.9 % — ABNORMAL LOW (ref 39.0–52.0)
MCH: 29.9 pg (ref 26.0–34.0)
MCV: 88.3 fL (ref 78.0–100.0)
RBC: 3.84 MIL/uL — ABNORMAL LOW (ref 4.22–5.81)
WBC: 14 10*3/uL — ABNORMAL HIGH (ref 4.0–10.5)

## 2010-12-20 LAB — RENAL FUNCTION PANEL
BUN: 167 mg/dL — ABNORMAL HIGH (ref 6–23)
Chloride: 101 mEq/L (ref 96–112)
Glucose, Bld: 240 mg/dL — ABNORMAL HIGH (ref 70–99)
Potassium: 5.1 mEq/L (ref 3.5–5.1)

## 2010-12-20 LAB — GLUCOSE, CAPILLARY
Glucose-Capillary: 225 mg/dL — ABNORMAL HIGH (ref 70–99)
Glucose-Capillary: 261 mg/dL — ABNORMAL HIGH (ref 70–99)

## 2010-12-20 LAB — DIFFERENTIAL
Basophils Relative: 1 % (ref 0–1)
Eosinophils Relative: 2 % (ref 0–5)
Lymphs Abs: 0.7 10*3/uL (ref 0.7–4.0)
Monocytes Relative: 10 % (ref 3–12)
Neutrophils Relative %: 82 % — ABNORMAL HIGH (ref 43–77)

## 2010-12-21 LAB — VANCOMYCIN, RANDOM: Vancomycin Rm: 19.3 ug/mL

## 2010-12-21 LAB — CBC
Platelets: 216 10*3/uL (ref 150–400)
RDW: 12.6 % (ref 11.5–15.5)
WBC: 15.9 10*3/uL — ABNORMAL HIGH (ref 4.0–10.5)

## 2010-12-21 LAB — GLUCOSE, CAPILLARY
Glucose-Capillary: 270 mg/dL — ABNORMAL HIGH (ref 70–99)
Glucose-Capillary: 148 mg/dL — ABNORMAL HIGH (ref 70–99)

## 2010-12-21 LAB — URINALYSIS, ROUTINE W REFLEX MICROSCOPIC
Ketones, ur: NEGATIVE mg/dL
Leukocytes, UA: NEGATIVE
Nitrite: NEGATIVE
Protein, ur: 30 mg/dL — AB
pH: 5 (ref 5.0–8.0)

## 2010-12-21 LAB — RENAL FUNCTION PANEL
Albumin: 2.3 g/dL — ABNORMAL LOW (ref 3.5–5.2)
BUN: 165 mg/dL — ABNORMAL HIGH (ref 6–23)
GFR calc Af Amer: 10 mL/min — ABNORMAL LOW (ref >60)
Phosphorus: 9.2 mg/dL (ref 2.3–4.6)
Potassium: 4.5 mEq/L (ref 3.5–5.1)
Sodium: 138 mEq/L (ref 135–145)

## 2010-12-21 LAB — PROTEIN ELECTROPH W RFLX QUANT IMMUNOGLOBULINS
Albumin ELP: 44.5 % — ABNORMAL LOW (ref 55.8–66.1)
Beta 2: 7.2 % — ABNORMAL HIGH (ref 3.2–6.5)

## 2010-12-21 LAB — URINE MICROSCOPIC-ADD ON

## 2010-12-21 LAB — IMMUNOFIXATION ADD-ON

## 2010-12-22 ENCOUNTER — Inpatient Hospital Stay (HOSPITAL_COMMUNITY): Payer: Medicare Other

## 2010-12-22 LAB — RENAL FUNCTION PANEL
CO2: 21 mEq/L (ref 19–32)
Chloride: 104 mEq/L (ref 96–112)
GFR calc Af Amer: 12 mL/min — ABNORMAL LOW (ref >60)
GFR calc non Af Amer: 10 mL/min — ABNORMAL LOW (ref >60)
Potassium: 4.6 mEq/L (ref 3.5–5.1)
Sodium: 139 mEq/L (ref 135–145)

## 2010-12-22 LAB — URINE CULTURE
Colony Count: NO GROWTH
Culture  Setup Time: 201203071330
Special Requests: NEGATIVE

## 2010-12-22 LAB — GLUCOSE, CAPILLARY
Glucose-Capillary: 254 mg/dL — ABNORMAL HIGH (ref 70–99)
Glucose-Capillary: 212 mg/dL — ABNORMAL HIGH (ref 70–99)

## 2010-12-22 LAB — MAGNESIUM: Magnesium: 2.3 mg/dL (ref 1.5–2.5)

## 2010-12-23 ENCOUNTER — Inpatient Hospital Stay (HOSPITAL_COMMUNITY): Payer: Medicare Other

## 2010-12-23 LAB — BASIC METABOLIC PANEL
CO2: 23 mEq/L (ref 19–32)
Calcium: 7.6 mg/dL — ABNORMAL LOW (ref 8.4–10.5)
GFR calc Af Amer: 14 mL/min — ABNORMAL LOW (ref >60)
GFR calc non Af Amer: 12 mL/min — ABNORMAL LOW (ref >60)
Sodium: 138 mEq/L (ref 135–145)

## 2010-12-23 LAB — GLUCOSE, CAPILLARY: Glucose-Capillary: 103 mg/dL — ABNORMAL HIGH (ref 70–99)

## 2010-12-23 LAB — URINALYSIS, ROUTINE W REFLEX MICROSCOPIC
Leukocytes, UA: NEGATIVE
Nitrite: NEGATIVE
Specific Gravity, Urine: 1.013 (ref 1.005–1.030)
Urobilinogen, UA: 0.2 mg/dL (ref 0.0–1.0)

## 2010-12-23 LAB — URINE MICROSCOPIC-ADD ON

## 2010-12-23 LAB — CBC
MCH: 29.6 pg (ref 26.0–34.0)
MCHC: 33 g/dL (ref 30.0–36.0)
MCV: 89.5 fL (ref 78.0–100.0)
Platelets: 257 10*3/uL (ref 150–400)
RDW: 12.9 % (ref 11.5–15.5)

## 2010-12-23 LAB — DIFFERENTIAL
Basophils Absolute: 0.2 10*3/uL — ABNORMAL HIGH (ref 0.0–0.1)
Eosinophils Absolute: 0.8 10*3/uL — ABNORMAL HIGH (ref 0.0–0.7)
Lymphs Abs: 1.1 10*3/uL (ref 0.7–4.0)
Monocytes Absolute: 1.1 10*3/uL — ABNORMAL HIGH (ref 0.1–1.0)
Monocytes Relative: 7 % (ref 3–12)
Neutro Abs: 12.2 10*3/uL — ABNORMAL HIGH (ref 1.7–7.7)

## 2010-12-23 LAB — UIFE/LIGHT CHAINS/TP QN, 24-HR UR
Alpha 2, Urine: DETECTED — AB
Beta, Urine: DETECTED — AB
Free Kappa Lt Chains,Ur: 12.1 mg/dL — ABNORMAL HIGH (ref 0.04–1.51)
Free Lt Chn Excr Rate: 96.8 mg/d
Total Protein, Urine: 32.7 mg/dL

## 2010-12-23 NOTE — Consult Note (Signed)
[name], AULL NO.:  [id number]  MEDICAL RECORD NO.:  [id number]           PATIENT TYPE:  LOCATION:                                 FACILITY:  PHYSICIAN:  Wilber Bihari. Caryn Section, M.D.   DATE OF BIRTH:  [birth year]  DATE OF CONSULTATION: DATE OF DISCHARGE:                                CONSULTATION   PRIMARY CARE PHYSICIAN:  Stacie Glaze, MD  CARDIOLOGIST:  Madolyn Frieze. [name], MD, [facility]  NEPHROLOGIST:  Dyke Maes, MD  CHIEF COMPLAINT/REASON FOR REFERRAL:  Rising creatinine.  HISTORY OF PRESENT ILLNESS:  [name] [name] is a 75 year old black man who is a patient of Dr. Briant Cedar with known CKD stage III-IV.  He was admitted to the hospital most recently on December 12, 2010 due to weakness and unsteady gait.  He was complaining of headache at that time and fever was also present. A lumbar puncture was obtained which did not have WBCS and had no growth on culture.  On hospital day #2, he experienced acute hypoxic event.  He was placed on BiPAP, admitted to step-down unit.  He was started on Lasix 40 mg IV at that time after chest x-ray showed pulmonary edema.  He received a total dose of 200 mg IV Lasix in a 24-hour period and was continued on IV Lasix 80 mg b.i.d. for the next 5 days.  During this time period, he was also on olmesartan 40 mg daily as well as vancomycin to treat presumed meningitis.  From December 17, 2010, vanco trough was obtained which was 29.  Vancomycin and ARB were stopped that day.  Of note, he has continued to remain on 40 mg IV Lasix.  Of note, on outpatient basis, the patient's creatinine was 2.46 in October 2011 and 1.9 in December 2011.  His December weight was 208 pounds.  On admission here, his creatinine was 3.34.  As noted above, he was given antibiotics and was diuresed with a weight of 90.7 kg on August 11, 2010.  Currently, the patient denies any shortness of breath, chest pain, edema, nausea, vomiting, or  abdominal pain.  PAST MEDICAL HISTORY: 1. CAD with a cath in 2005 and right coronary artery PCA at that     time.  Myoview in 2010 showed a 46% and ejection fraction. 2. Chronic kidney disease, stage III-IV. 3. Hypertension. 4. Diabetes mellitus. 5. Hyperlipidemia. 6. Prior CVA. 7. Hypothyroidism. 8. Gout. 9. Chronic systolic versus diastolic heart failure.  MEDICATIONS: 1. Norvasc 10 mg p.o. daily. 2. Aspirin 35 mg daily. 3. Lasix 40 mg IV daily. 4. Dilaudid 2 mg p.o. 5. NovoLog sliding scale insulin. 6. Lantus 25 units subcu at bedtime. 7. Bystolic 10 mg daily. 8. Protonix 40 mg daily. 9. Prednisone taper 40 mg p.o. daily x1 today, tapering down. 10.Crestor 10 mg daily.  FAMILY HISTORY:  The patient has family history of mother with type 2 diabetes with end-stage renal disease for which she was on dialysis. Also with history of hypertension.  Father with a history of lung cancer and eventually succumbed to this.  Sister also with history of end-stage renal  disease but passed away before she was put on dialysis.  SOCIAL HISTORY:  The patient lives in [place] with his wife.  He is a businessman.  He has two children at home with him.  He has two pack-year history of smoking but quit in 1950s.  No ethanol or drug abuse.  REVIEW OF SYSTEMS:  Fourteen-point review of systems performed and negative except for HPI.  PHYSICAL EXAMINATION:  VITAL SIGNS:  Temperature of 97.4, pulse 50-57, respiratory rate 16-20, blood pressure 92-116 over 50-70, O2 sat 94% on 1 L nasal cannula.  Underlying ins and outs 240/350 for a negative balance of 110. These are not accurately kept. GENERAL:  No acute distress, pleasant man lying in bed. HEENT:  Normocephalic, atraumatic.  Extraocular movements intact. Pupils equal, round, reactive to light.  Dry mucous membranes on exam, except without lymphadenopathy. CARDIAC:  Bradycardic heart rate but regular rhythm.  Good S1 and S2. No murmurs  auscultated.  Pulses +2 and equal bilaterally. LUNGS:  Clear to auscultation bilaterally.  No wheezes, rales. ABDOMEN:  Soft, nondistended and nontender.  No rebound or guarding. Good bowel sounds. GU:  Normal genitalia.  No scrotal edema. EXTREMITIES:  No clubbing, erythema, and cyanosis.  No edema noted on exam.  Also no edema noted in sacral area or upper extremities. MUSCULOSKELETAL:  Some tenderness in the patient's left ankle.  However, no joint deformity and tenderness. SKIN:  No rashes or lesions. NEUROLOGIC:  Alert and oriented x3.  Cranial nerves II through XII intact.  Exam is grossly intact throughout.  IMAGING:  Renal ultrasound on December 18, 2010, showed right kidney measuring 10.1 cm with increased cortical echogenicity.  Left kidney measures 9.2 cm also with increased cortical echogenicity.  No hydronephrosis bilaterally.  LABORATORY DATA:  CBC showed white blood cells of 15.2, hemoglobin 11, hematocrit 32.6, platelets 190,000. BMET showed sodium 135, potassium 4.9, chloride 199, bicarb 19, BUN 149, creatinine 6.75, glucose 277, calcium 7.3.  Blood culture have been negative x2.  CSF cultures have also been negative.  ASSESSMENT AND PLAN:  [name] is a 75 year old black man with   diabetes mellitus type 2, hypertension, chronic kidney disease, now acute kidney injury. 1. Acute on chronic kidney injury.  This is most likely multifactorial     in nature.  The patient does have known stage III to stage IV     kidney disease and is a patient of Dr. Edd Arbour.  He has been     receiving 80 mg of IV Lasix along with olmesartan and vancomycin     daily.  As noted above, renal ultrasound shows no hydronephrosis.     He does have elevated BUN to 149 which is most likely from a     combination of steroids as well as volume depleation.  For the past 24     hours, the patient's pulse and blood pressure have both been low.     Most likely, he is suffering from prerenal  deficits as well as     nephrotoxicity from vancomycin and     olmesartan.  As noted above, there is no evidence of overload on     exam or by latest chest x-ray.  Plan to hold the patient's blood     pressure medicines for a systolic less than 120 or a pulse of less     than 60.  We are also decreasing the patient's blood pressure     medicines to Norvasc 5 mg daily  and Bystolic 5 mg daily as well.     We plan to hold his Lasix.  SPEP and UPEP to make sure multiple     myeloma is not an etiology.  We plan to discontinue the patient's     Foley and obtain UA and CBC with diff in the morning to rule out     acute interstitial nephritis. 2. Acute on chronic heart failure.  This is resolved.  No evidence of     rales or fluid overload on exam.  As noted above, we will hold     Lasix. 3. Hypertension--too low currently.  Blood pressures in the     past 24 hours has been as noted above 90s-116 greatest systolic.     Plan to decrease Norvasc and Bystolic as noted above. 4. Diabetes mellitus type 2.  CBGs have been elevated likely secondary     to prednisone as well as the patient's longstanding diabetes.  His     Lantus had been increased to 25 units per his primary physician. 5. Gout.  This is the reason for the patient being on prednisone.  He     is not on any NSAIDs due to CKD. 6. Disposition.  Hopeful for rapid recovery.  We will follow along     with the patient.  Thank you for this consult.     Renold Don, MD   ______________________________ Wilber Bihari. Caryn Section, M.D.    JW/MEDQ  D:  12/19/2010  T:  12/20/2010  Job:  [id number]  Electronically Signed by Renold Don  on 12/20/2010 03:32:51 PM Electronically Signed by Marina Gravel M.D. on 12/22/2010 06:43:30 PM

## 2010-12-24 LAB — GLUCOSE, CAPILLARY

## 2010-12-25 LAB — URINE CULTURE
Colony Count: >=100000
Culture  Setup Time: 201203090037

## 2010-12-25 LAB — GLUCOSE, CAPILLARY
Glucose-Capillary: 246 mg/dL — ABNORMAL HIGH (ref 70–99)
Glucose-Capillary: 400 mg/dL — ABNORMAL HIGH (ref 70–99)

## 2010-12-25 LAB — BASIC METABOLIC PANEL
BUN: 123 mg/dL — ABNORMAL HIGH (ref 6–23)
CO2: 25 mEq/L (ref 19–32)
Chloride: 100 mEq/L (ref 96–112)
GFR calc non Af Amer: 13 mL/min — ABNORMAL LOW (ref >60)
Glucose, Bld: 250 mg/dL — ABNORMAL HIGH (ref 70–99)
Potassium: 5 mEq/L (ref 3.5–5.1)
Sodium: 137 mEq/L (ref 135–145)

## 2010-12-25 LAB — RENAL FUNCTION PANEL
Albumin: 1.9 g/dL — ABNORMAL LOW (ref 3.5–5.2)
BUN: 123 mg/dL — ABNORMAL HIGH (ref 6–23)
CO2: 29 mEq/L (ref 19–32)
Chloride: 100 mEq/L (ref 96–112)
Creatinine, Ser: 4.54 mg/dL — ABNORMAL HIGH (ref 0.4–1.5)
GFR calc non Af Amer: 12 mL/min — ABNORMAL LOW (ref >60)
Glucose, Bld: 265 mg/dL — ABNORMAL HIGH (ref 70–99)
Glucose, Bld: 252 mg/dL — ABNORMAL HIGH (ref 70–99)
Phosphorus: 4.9 mg/dL — ABNORMAL HIGH (ref 2.3–4.6)
Potassium: 5.8 mEq/L — ABNORMAL HIGH (ref 3.5–5.1)
Sodium: 135 mEq/L (ref 135–145)

## 2010-12-25 LAB — CBC
HCT: 34.9 % — ABNORMAL LOW (ref 39.0–52.0)
HCT: 35.4 % — ABNORMAL LOW (ref 39.0–52.0)
Hemoglobin: 11.3 g/dL — ABNORMAL LOW (ref 13.0–17.0)
MCH: 30 pg (ref 26.0–34.0)
MCV: 92.6 fL (ref 78.0–100.0)
Platelets: 265 10*3/uL (ref 150–400)
RBC: 3.77 MIL/uL — ABNORMAL LOW (ref 4.22–5.81)
WBC: 14.3 10*3/uL — ABNORMAL HIGH (ref 4.0–10.5)

## 2010-12-26 LAB — GLUCOSE, CAPILLARY
Glucose-Capillary: 406 mg/dL — ABNORMAL HIGH (ref 70–99)
Glucose-Capillary: 420 mg/dL — ABNORMAL HIGH (ref 70–99)

## 2010-12-26 LAB — GLUCOSE, RANDOM: Glucose, Bld: 401 mg/dL — ABNORMAL HIGH (ref 70–99)

## 2010-12-26 LAB — BASIC METABOLIC PANEL
CO2: 28 mEq/L (ref 19–32)
Chloride: 97 mEq/L (ref 96–112)
Creatinine, Ser: 4.9 mg/dL — ABNORMAL HIGH (ref 0.4–1.5)
GFR calc Af Amer: 14 mL/min — ABNORMAL LOW (ref >60)
Sodium: 135 mEq/L (ref 135–145)

## 2010-12-26 LAB — CBC
Hemoglobin: 9.4 g/dL — ABNORMAL LOW (ref 13.0–17.0)
Platelets: 273 10*3/uL (ref 150–400)
RBC: 3.24 MIL/uL — ABNORMAL LOW (ref 4.22–5.81)
WBC: 14.1 10*3/uL — ABNORMAL HIGH (ref 4.0–10.5)

## 2010-12-27 LAB — BASIC METABOLIC PANEL
BUN: 145 mg/dL — ABNORMAL HIGH (ref 6–23)
CO2: 27 mEq/L (ref 19–32)
Chloride: 98 mEq/L (ref 96–112)
Creatinine, Ser: 4.81 mg/dL — ABNORMAL HIGH (ref 0.4–1.5)
Potassium: 4.6 mEq/L (ref 3.5–5.1)

## 2010-12-27 LAB — GLUCOSE, CAPILLARY: Glucose-Capillary: 306 mg/dL — ABNORMAL HIGH (ref 70–99)

## 2010-12-29 LAB — CULTURE, BLOOD (ROUTINE X 2)
Culture  Setup Time: 201203091406
Culture: NO GROWTH

## 2011-01-04 ENCOUNTER — Telehealth: Payer: Self-pay | Admitting: Cardiology

## 2011-01-04 NOTE — Telephone Encounter (Signed)
stephaine [facility]. Should pt  be on k+. Due to being on a lot of lasix. Also he would need to be set up for an appt.

## 2011-01-04 NOTE — Discharge Summary (Signed)
[name], [name] [name] NO.:  [id number]  MEDICAL RECORD NO.:  [id number]           PATIENT TYPE:  I  LOCATION:  6743                         FACILITY:  [facility]  PHYSICIAN:  [name] Llano, MD       DATE OF BIRTH:  11-18-1935  DATE OF ADMISSION:  12/12/2010 DATE OF DISCHARGE:  12/27/2010                              DISCHARGE SUMMARY   PRIMARY CARE PHYSICIAN:  [name] Glaze, MD  CARDIOLOGIST:  [name] [name] [name]. [name] Som, MD, [facility]  NEPHROLOGIST:  [name] Maes, MD  REASON FOR ADMISSION:  Dizzy spells and unsteady gait.  DISCHARGE DIAGNOSES: 1. Acute renal failure, on chronic kidney disease, stage IV. 2. Dizziness secondary to medication, resolved. 3. Acute-on-chronic systolic/diastolic congestive heart failure, now     compensated. 4. Gouty arthritis. 5. Diabetes mellitus, type 2. 6. Urinary tract infection. 7. Hypothyroidism. 8. Hypertension. 9. Prior cerebrovascular accident. 10.Left ventricular ejection fraction 35% with grade 2 diastolic     dysfunction.  DISCHARGE MEDICATIONS: 1. Amlodipine 10 mg p.o. daily. 2. Hydralazine 50 mg b.i.d. 3. Imdur 120 mg p.o. daily. 4. Aspirin 325 mg p.o. daily. 5. Bystolic 10 mg p.o. daily. 6. Calcitriol 0.25 mcg 2 caps on even days and one cap on odd days. 7. Crestor 20 mg p.o. daily. 8. Glyburide 5 mg p.o. b.i.d. 9. Hydrocodone/APAP 5/500 for gout pain every 6 hours as needed. 10.Lasix 80 mg 1 tablet in the morning and 1/2 tablet in p.m. 11.Magnesium 250 mg daily. 12.Nitroglycerin sublingual 0.4 mg every 5 hours as needed for chest     pain up to three doses. 13.Patanol 0.1% ophthalmic 1 drop in both eyes twice daily. 14.Synthroid 200 mcg p.o. daily.  Stop taking the following medication:  Colcrys 0.6 mg p.o. daily as needed for gout pain.  RADIOLOGY: 1. Chest x-ray showed patchy bibasilar airspace opacity maybe due to     secondary to atelectasis in this low-volume chest. 2. Foot x-ray showed erosive  arthropathy involving the first and     second MTP joints consistent with gout.  No osseous abnormality of     the ankle. 3. Ultrasound of the kidney.  Ultrasound showed medical renal disease,     no obstruction. 4. V/Q scan showed low probability of ventilation-perfusion scan for     pulmonary embolism. 5. MRI of the brain, on December 13, 2010, showed no acute     intracranial findings, some progression of atrophy and small-vessel     disease since 2008. 6. MRA of the brain and neck vessels showed dolichoectatic but widely     patent intracranial vasculature. 7. MRA of the head, noncontrast MRA of the extracranial circulation     with limitation, no obvious flow-reducing stenosis is observed. 8. CT scan of the head showed no acute intracranial abnormalities.  BRIEF HISTORY EXAMINATION:  Mr. [name] [name] [name] is a 75 year old African American male with history of coronary artery disease, status post cardiac cath plus stent.  Prior CVA.  The patient has chronic kidney disease being followed by nephrologist.  The patient came to the hospital because of dizzy spells and unsteady gait.  At baseline, the  patient ambulates without any walker.  However, there prior to presenting to emergency room, he notes very dizzy and the patient is very blurry at this time.  His gait was unsteady, and he was swinging from side-to-side.  He denied any history of nausea or vomiting.  He denied any chest pain or shortness of breath.  Denied any slurred speech and denied any numbness of any side.  The patient evaluated initially by CT scan, which was normal admitted for further evaluation. 1. Dizziness.  The patient's dizziness is thought to be     multifactorial.  The patient has E. coli UTI, which was treated     with IV antibiotic during the hospital stay with ceftriaxone.     Because of the patient has some fever and headaches, LP was done     and ruled out meningitis.  The patient also has MRI done, which      ruled out acute findings like vertebrobasilar insufficiency.  The     patient's dizziness resolved after his narcotics and muscle     relaxants were discontinued.  UTI was resolved. 2. Acute renal failure, on background of stage IV chronic kidney     disease.  The patient, at the time of admission, was presented with     creatinine of 3.2 and peak went up to 7.09.  The patient's baseline     in December 2011 was 1.9.  His increasing creatinine was thought to     be secondary to dehydration.  IV fluids with bicarb were started.     The patient's creatinine went down to about 4.7 to 4.8 on the day     of discharge.  Nephrology was seeing the patient and they     recommended to restart home medication, which is Lasix 80 mg in the     morning and 40 mg in the evening at the day of discharge.  Because     of the patient, fluid status should be maintained. 3. Gouty arthritis.  The patient was not able to walk because of gout     flare in the hospital.  The patient was started on steroids.  The     other medications for gout were contraindicated because of his     rapidly increasing creatinine.  His gout flare took long time to     resolve until he can bear weight in his foot and walk.  He was seen     initially by physical therapy, occupational therapy, and they     recommended actually for rehab, but the patient made progress and     he can walk with minimal assistance and assistance of walker, and     he elected to go home.  The patient will be discharged home with     home physical therapy. 4. Diabetes mellitus, type 2.  The patient's diabetes is uncontrolled     with hemoglobin A1c 8.3.  The patient is taking Lantus 25 units     subcutaneously at bedtime and that was continued.  The patient     might need to bring his log to his physician next time for further     adjustment of his insulin dose.  Diabetes mellitus, type 2,     uncontrolled.  The patient is on glyburide at home.  The  patient     required some insulin during hospital stay.  Probably he will need     some insulin.  The glycemic control  during the hospital stay was     interrupted by periods of intravenous and oral steroids given to     the patient because of his gouty arthritis flare.  So the patient     was not sent home on insulin.  The patient instructed to take his     CBG's locked to his primary care physician to decide about starting     Lantus insulin. 5. UTI.  The patient has E. coli UTI with ceftriaxone.  The patient     had via Foley catheter for some time.  At the end of his hospital     stay, he developed fungi urea without evidence of UTI.  That was     treated only for 3 days with Diflucan, but that was discontinued     cause it is thought.  It is related to the Foley catheter. 6. Hypertension.  Blood pressure was in the high side during the     hospital stay.  The patient needs tighter blood pressure control.     His ACE inhibitor was stopped because of his increased worsening of     his renal function.  He was discharged on increased dose of Imdur,     Bystolic, and amlodipine as well as hydralazine.  The patient needs     more control.  On the day of discharge, his blood pressure was     126/64.  DISCHARGE INSTRUCTIONS: 1. Activity as tolerated. 2. Disposition home with home physical therapy. 3. Diet, carbohydrate-modified diet, low renal, and low potassium     diet.     [name] Llano, MD     ME/MEDQ  D:  12/27/2010  T:  12/28/2010  Job:  [id number]  cc:   [name] Glaze, MD [name] [name] [name], M.D.  Electronically Signed by [name] [name] [name]  on 01/04/2011 08:20:04 PM

## 2011-01-04 NOTE — Telephone Encounter (Signed)
Spoke with home health nurse, pt with acute renal failure while in the hosp. No change in meds until seen or labs repeated.

## 2011-01-06 ENCOUNTER — Encounter: Payer: Self-pay | Admitting: Internal Medicine

## 2011-01-09 ENCOUNTER — Encounter: Payer: Self-pay | Admitting: Cardiology

## 2011-01-10 ENCOUNTER — Encounter: Payer: Self-pay | Admitting: Cardiology

## 2011-01-10 ENCOUNTER — Ambulatory Visit (INDEPENDENT_AMBULATORY_CARE_PROVIDER_SITE_OTHER): Payer: Medicare Other | Admitting: Cardiology

## 2011-01-10 DIAGNOSIS — I2589 Other forms of chronic ischemic heart disease: Secondary | ICD-10-CM

## 2011-01-10 DIAGNOSIS — I2510 Atherosclerotic heart disease of native coronary artery without angina pectoris: Secondary | ICD-10-CM

## 2011-01-10 DIAGNOSIS — I10 Essential (primary) hypertension: Secondary | ICD-10-CM

## 2011-01-10 MED ORDER — AMLODIPINE BESYLATE 5 MG PO TABS: 5.0000 mg | ORAL_TABLET | Freq: Every day | ORAL | Status: DC

## 2011-01-10 MED ORDER — CARVEDILOL 3.125 MG PO TABS: 3.1250 mg | ORAL_TABLET | Freq: Two times a day (BID) | ORAL | Status: DC

## 2011-01-10 NOTE — Assessment & Plan Note (Signed)
Continue statin. Check lipids and liver. 

## 2011-01-10 NOTE — Progress Notes (Signed)
HPI: [name] [name] is a pleasant gentleman with history of coronary artery disease.  His most recent catheterization was in March 2005 and that showed a 40% left main, no significant disease in the circumflex, total occlusion of the ramus branch with a 50% first marginal, 40% proximal, followed by 95% mid, and 90% mid distal stenosis in the right coronary artery.  The patient had successful PCI of the right coronary artery both the mid and mid-to- distal right lesions.  His last Myoview was performed in Dec 2010. His ejection fraction was 45%.  There was a fixed defect in the inferior wall without ischemia. IAdmitted in February of 2012 with dizziness which was felt secondary to a combination of urinary tract infection, narcotics and muscle relaxants. Also with acute on chronic renal insufficiency. There was felt to be a component of dehydration. Echocardiogram in March of 2012 showed an ejection fraction of 35%, mild mitral regurgitation, mildly dilated aortic root, mild right atrial and right ventricular enlargement. VQ scan was low probability. Since discharge the patient denies any dyspnea on exertion, orthopnea, PND, pedal edema, palpitations, syncope or chest pain.   Current Outpatient Prescriptions  Medication Sig Dispense Refill  . amLODipine (NORVASC) 10 MG tablet Take 10 mg by mouth daily.        Marland Kitchen aspirin 325 MG tablet Take 325 mg by mouth daily.        Marland Kitchen atorvastatin (LIPITOR) 40 MG tablet Take 40 mg by mouth daily.        . calcitRIOL (ROCALTROL) 0.25 MCG capsule 0.25 mcg. Take one cap by mouth on odd days and 2 caps on even days.       . furosemide (LASIX) 80 MG tablet 1 tab in the morning and 1/2 tab at night      . glyBURIDE (DIABETA) 5 MG tablet Take 5 mg by mouth 2 (two) times daily.       . hydrALAZINE (APRESOLINE) 50 MG tablet Take 50 mg by mouth 2 (two) times daily.        Marland Kitchen HYDROcodone-acetaminophen (VICODIN) 5-500 MG per tablet Take 1 tablet by mouth every 6 (six) hours as needed.         . isosorbide mononitrate (IMDUR) 60 MG 24 hr tablet Take 120 mg by mouth daily.       Marland Kitchen levothyroxine (SYNTHROID, LEVOTHROID) 200 MCG tablet Take 200 mcg by mouth daily.        . nitroGLYCERIN (NITROSTAT) 0.4 MG SL tablet Place 0.4 mg under the tongue every 5 (five) minutes as needed.        Marland Kitchen olopatadine (PATANOL) 0.1 % ophthalmic solution 1 drop 2 (two) times daily.        Marland Kitchen DISCONTD: amLODipine-olmesartan (AZOR) 10-40 MG per tablet Take 1 tablet by mouth daily.       Marland Kitchen DISCONTD: nebivolol (BYSTOLIC) 10 MG tablet Take 10 mg by mouth daily.        Marland Kitchen DISCONTD: rosuvastatin (CRESTOR) 20 MG tablet Take 20 mg by mouth daily.           Past Medical History  Diagnosis Date  . CAD (coronary artery disease)   . HTN (hypertension)   . Hyperlipidemia   . Cerebrovascular accident   . CHF (congestive heart failure)   . Diabetes mellitus   . CKD (chronic kidney disease)   . Unspecified hypothyroidism   . Gout     Past Surgical History  Procedure Date  . Thyroidectomy     History  Social History  . Marital Status: Married    Spouse Name: N/A    Number of Children: N/A  . Years of Education: N/A   Occupational History  . Not on file.   Social History Main Topics  . Smoking status: Former Games developer  . Smokeless tobacco: Not on file   Comment: quit in the 50's  . Alcohol Use: Not on file  . Drug Use: Not on file  . Sexually Active: Not on file   Other Topics Concern  . Not on file   Social History Narrative  . No narrative on file    ROS: no fevers or chills, productive cough, hemoptysis, dysphasia, odynophagia, melena, hematochezia, dysuria, hematuria, rash, seizure activity, orthopnea, PND, pedal edema, claudication. Remaining systems are negative.  Physical Exam: Well-developed well-nourished in no acute distress.  Skin is warm and dry.  HEENT is normal.  Neck is supple. No thyromegaly.  Chest is clear to auscultation with normal expansion.  Cardiovascular exam  is regular rate and rhythm.  Abdominal exam nontender or distended. No masses palpated. Extremities show no edema. neuro grossly intact  ECG December 12, 2010 Sinus  rhythm with PACs. First degree AV block. Axis normal. Cannot rule out prior anterior infarct. Nonspecific ST changes.

## 2011-01-10 NOTE — Assessment & Plan Note (Signed)
Monitored by nephrology. 

## 2011-01-10 NOTE — Assessment & Plan Note (Signed)
Euvolemic on examination. Continue present dose of diuretic. 

## 2011-01-10 NOTE — Assessment & Plan Note (Signed)
Blood pressure controlled on present medications. Will continue. 

## 2011-01-10 NOTE — Assessment & Plan Note (Signed)
Management per primary care. 

## 2011-01-10 NOTE — Patient Instructions (Signed)
Your physician has requested that you have a lexiscan myoview. For further information please visit [email or web address] Please follow instruction sheet, as given.  DECREASE AMLODIPINE TO 5MG  ONCE DAILY START CARVEDILOL 3.125MG  ONE TABLET TWICE DAILY WITH FOOD LAB WORK WITH STRESS TEST Your physician recommends that you schedule a follow-up appointment in: 3 MONTHS

## 2011-01-10 NOTE — Assessment & Plan Note (Signed)
Patient will continue with aspirin and statin. His ejection fraction is now reportedly 35%. Continue hydralazine and nitrates. ACE Inhibitor discontinued secondary to renal insufficiency. Add Coreg 3.125 mg p.o. B.i.d. Watch for bradycardia given baseline first degree AV block. His ejection fraction is decreased compared to previous. Schedule Myoview to screen for any progressive coronary disease.

## 2011-01-12 ENCOUNTER — Ambulatory Visit (INDEPENDENT_AMBULATORY_CARE_PROVIDER_SITE_OTHER): Payer: Medicare Other | Admitting: Internal Medicine

## 2011-01-12 ENCOUNTER — Encounter: Payer: Self-pay | Admitting: Internal Medicine

## 2011-01-12 ENCOUNTER — Ambulatory Visit: Payer: Medicare Other | Admitting: Internal Medicine

## 2011-01-12 VITALS — BP 134/80 | HR 88 | Temp 99.2°F | Resp 16 | Ht 69.0 in | Wt 191.0 lb

## 2011-01-12 DIAGNOSIS — E119 Type 2 diabetes mellitus without complications: Secondary | ICD-10-CM

## 2011-01-12 DIAGNOSIS — R072 Precordial pain: Secondary | ICD-10-CM

## 2011-01-12 DIAGNOSIS — N183 Chronic kidney disease, stage 3 (moderate): Secondary | ICD-10-CM

## 2011-01-12 DIAGNOSIS — E039 Hypothyroidism, unspecified: Secondary | ICD-10-CM

## 2011-01-12 DIAGNOSIS — I10 Essential (primary) hypertension: Secondary | ICD-10-CM

## 2011-01-12 DIAGNOSIS — E875 Hyperkalemia: Secondary | ICD-10-CM

## 2011-01-12 DIAGNOSIS — I5033 Acute on chronic diastolic (congestive) heart failure: Secondary | ICD-10-CM

## 2011-01-12 DIAGNOSIS — I129 Hypertensive chronic kidney disease with stage 1 through stage 4 chronic kidney disease, or unspecified chronic kidney disease: Secondary | ICD-10-CM

## 2011-01-12 LAB — HEMOGLOBIN A1C: Hgb A1c MFr Bld: 9.2 % — ABNORMAL HIGH (ref 4.6–6.5)

## 2011-01-12 NOTE — Assessment & Plan Note (Signed)
Patient is scheduled for a stress Myoview later this month to evaluate the etiology of his chest pain

## 2011-01-12 NOTE — Assessment & Plan Note (Signed)
Needs monitoring

## 2011-01-12 NOTE — Assessment & Plan Note (Signed)
His blood sugars during the hospital day were moderately well controlled his A1c was reported to be 8.3 Goal of blood sugar control be less than 7

## 2011-01-12 NOTE — Progress Notes (Signed)
  Subjective:    Patient ID: [name] [name], male    DOB: 1936/06/28, 75 y.o.   MRN: [id number]  HPI Patient is a 75 year old African American male who we have followed for diabetes hypertension and renal failure 2 presented to emergency room with chest pain and infection he was too for Escherichia coli prostate infection and was evaluated for chest pain.  He has since seen a cardiologist and has his medications have been changed as well as been scheduled for a stress Myoview. He has a followup with cardiologist in June at which time he will have a lipid and a liver panel drawn prior to that he also has a planned visit with me in May. Since he is got out of the hospital he has done well he is tolerating the medications his blood pressure is actually better controlled his weight is stable he has less edema and he has no shortness of breath or chest pain at this time   Review of Systems  Constitutional: Negative for fever and fatigue.  HENT: Negative for hearing loss, congestion, neck pain and postnasal drip.   Eyes: Negative for discharge, redness and visual disturbance.  Respiratory: Negative for cough, shortness of breath and wheezing.   Cardiovascular: Negative for leg swelling.  Gastrointestinal: Negative for abdominal pain, constipation and abdominal distention.  Genitourinary: Negative for urgency and frequency.  Musculoskeletal: Negative for joint swelling and arthralgias.  Skin: Negative for color change and rash.  Neurological: Negative for weakness and light-headedness.  Hematological: Negative for adenopathy.  Psychiatric/Behavioral: Negative for behavioral problems.   Past Medical History  Diagnosis Date  . CAD (coronary artery disease)   . HTN (hypertension)   . Hyperlipidemia   . Cerebrovascular accident   . CHF (congestive heart failure)   . Diabetes mellitus   . CKD (chronic kidney disease)   . Unspecified hypothyroidism   . Gout    Past Surgical History  Procedure  Date  . Thyroidectomy     reports that he has quit smoking. He does not have any smokeless tobacco history on file. He reports that he does not drink alcohol or use illicit drugs. family history includes Diabetes in an unspecified family member and Kidney failure in his mother. No Known Allergies     Objective:   Physical Exam  Constitutional: He is oriented to person, place, and time. He appears well-developed and well-nourished.  HENT:  Head: Normocephalic and atraumatic.  Eyes: Conjunctivae are normal. Pupils are equal, round, and reactive to light.  Neck: Normal range of motion. Neck supple.  Cardiovascular: Normal rate and regular rhythm.   Pulmonary/Chest: Effort normal and breath sounds normal.  Abdominal: Soft. Bowel sounds are normal.  Musculoskeletal: Normal range of motion.  Neurological: He is alert and oriented to person, place, and time.  Skin: Skin is warm and dry.          Assessment & Plan:  The discharge summary from his hospitalization was reviewed with the patient and his daughter he states he has been doing well on the new medications specifically they changed his hypertensive medications adding amlodipine 10 mg and adding isosorbide. He thinks his blood sugars have been well-controlled although is currently not monitoring him we will plan an A1c today he is already scheduled to have a lipid and liver drawn next month and he has a followup visit with me.

## 2011-01-12 NOTE — Assessment & Plan Note (Signed)
He has had an episode of high potassium associated with his renal disease his potassium was reported to be normal at the time of his discharge from the hospital

## 2011-01-12 NOTE — Assessment & Plan Note (Signed)
Blood pressure was reported to be higher in the hospital but today his blood pressure is excellent he is on 5 mg of amlodipine hydralazine Imdur by bystolic Lasix He is currently not on dialysis and fluid control as are the most important part of controlling his blood pressure

## 2011-01-21 LAB — URIC ACID: Uric Acid, Serum: 9 mg/dL — ABNORMAL HIGH (ref 4.0–7.8)

## 2011-01-23 ENCOUNTER — Ambulatory Visit (HOSPITAL_COMMUNITY): Payer: Medicare Other | Attending: Cardiology | Admitting: Radiology

## 2011-01-23 VITALS — BP 143/72 | HR 73 | Ht 69.0 in | Wt 188.0 lb

## 2011-01-23 DIAGNOSIS — E119 Type 2 diabetes mellitus without complications: Secondary | ICD-10-CM

## 2011-01-23 DIAGNOSIS — I2510 Atherosclerotic heart disease of native coronary artery without angina pectoris: Secondary | ICD-10-CM

## 2011-01-23 DIAGNOSIS — R0989 Other specified symptoms and signs involving the circulatory and respiratory systems: Secondary | ICD-10-CM

## 2011-01-23 MED ORDER — TECHNETIUM TC 99M TETROFOSMIN IV KIT
11.0000 | PACK | Freq: Once | INTRAVENOUS | Status: AC | PRN
  Administered 2011-01-23: 11 via INTRAVENOUS

## 2011-01-23 MED ORDER — TECHNETIUM TC 99M TETROFOSMIN IV KIT
33.0000 | PACK | Freq: Once | INTRAVENOUS | Status: AC | PRN
  Administered 2011-01-23: 33 via INTRAVENOUS

## 2011-01-23 MED ORDER — REGADENOSON 0.4 MG/5ML IV SOLN
0.4000 mg | Freq: Once | INTRAVENOUS | Status: AC
  Administered 2011-01-23: 0.4 mg via INTRAVENOUS

## 2011-01-23 NOTE — Progress Notes (Deleted)
[facility] SITE 3 NUCLEAR MED [address] [place] [address] [phone]  Cardiology Nuclear Med Study  [name] [name] is a 75 y.o. male [id number] September 04, 1936   Nuclear Med Background Indication for Stress Test:  Evaluation for Ischemia, Stent Patency and PTCA Patency History:  {CHL HISTORY STRESS TEST:21021012} Cardiac Risk Factors: CVA, History of Smoking, Hypertension, Lipids and NIDDM  Symptoms:  {CHL SYMPTOMS STRESS TEST:21021013}   Nuclear Pre-Procedure Caffeine/Decaff Intake:  None NPO After: 10:00pm   Lungs:  *** IV 0.9% NS with Angio Cath:  22g  IV Site: R Antecubital  IV Started by:  Irean Hong, RN  Chest Size (in):  42 Cup Size: N/A  Height: 5\' 9"  (1.753 m)  Weight:  188 lb (85.276 kg)  BMI:  Body mass index is 27.76 kg/(m^2). Tech Comments:  Held carvedilol this am    Nuclear Med Study 1 or 2 day study: {CHL 1 OR 2 DAY STUDY:21021019}  Stress Test Type:  {CHL STRESS TEST TYPE:21021018}  Reading MD: {CHL LB NUC READING OZ:30865784}  Order Authorizing Provider:  ***  Resting Radionuclide: {CHL RESTING RADIONUCLIDE:21021021}  Resting Radionuclide Dose: *** mCi   Stress Radionuclide:  {CHL STRESS RADIONUCLIDE:21021022}  Stress Radionuclide Dose: *** mCi           Stress Protocol Rest HR: *** Stress HR: ***  Rest BP: *** Stress BP: ***  Exercise Time (min): {NA AND WILDCARD:21589} METS: {NA AND ONGEXBMW:41324}          Dose of Adenosine (mg):  {NA AND MWNUUVOZ:36644} Dose of Lexiscan: {CHL CARD WILDCARD AND 0.4:21590} mg  Dose of Atropine (mg): {NA AND IHKVQQVZ:56387} Dose of Dobutamine: {NA AND WILDCARD:21589} mcg/kg/min (at max HR)  Stress Test Technologist: {CHL LB STRESS TEST TECHNOLOGIST:21021024}  Nuclear Technologist:  {CHL LB NUCLEAR TECHNOLOGIST:21021025}     Rest Procedure:  {CHL REST PROCEDURE NUCLEAR:21021027} Rest ECG: {CHL REST FIE:33295}  Stress Procedure:  {CHL STRESS PROCEDURE NUCLEAR:21021028} Stress ECG:  {CHL CAR STRESS ECG:21561}  QPS Raw Data Images:  {CHL RAW DATA IMAGES NUC:21021029} Stress Images:  {CHL STRESS IMAGES NUC:21021030} Rest Images:  {CHL REST IMAGES NUC:21021031} Subtraction (SDS):  {CHL SUBTRACTION (SDS) NUC:21021032} Transient Ischemic Dilatation (Normal <1.22):  *** Lung/Heart Ratio (Normal <0.45):  ***  Quantitative Gated Spect Images QGS EDV:  *** ml QGS ESV:  *** ml QGS cine images:  {CHL CARD QGS:21591:o} QGS EF: {CHL CARD STUDY NOT GATED:21592:o}  Impression Exercise Capacity:  {CHL EXERCISE CAPACITY NUC:21021037} BP Response:  {CHL BP RESPONSE NUC:21021038} Clinical Symptoms:  {CHL CLINICAL SYMPTOMS NUC:21021039} ECG Impression:  {CHL ECG IMPRESSION NUC:21021040} Comparison with Prior Nuclear Study: {CHL NUCLEAR STUDY COMPARISON:21562}  Overall Impression:  {CHL OVERALL IMPRESSION NUC:21021041}    Signed by Irean Hong A on 01/23/2011 at 8:48 AM.

## 2011-01-23 NOTE — Progress Notes (Signed)
[facility] SITE 3 NUCLEAR MED [address] [place] [address] [phone]  Cardiology Nuclear Med Study  [name] [name] is a 75 y.o. male [id number] 1936/08/06   Nuclear Med Background Indication for Stress Test:  Evaluation for Ischemia and Stent Patency History: '05 Heart Catheterization 40% (L) main totalled Ramus, '05 Stents: RCA, 2010 MPS EF45% Inf. Scar, 12/16/10 ECHO EF 35%, CHF Cardiac Risk Factors: CVA, History of Smoking, Lipids and NIDDM  Symptoms:  Fatigue and Palpitations   Nuclear Pre-Procedure Caffeine/Decaff Intake:  none NPO After: 10:00pm   Lungs:  clear IV 0.9% NS with Angio Cath:  22g  IV Site: R Antecubital  IV Started by:  Irean Hong, RN  Chest Size (in): 42 Cup Size: n/a  Height: 5\' 9"  (1.753 m)  Weight:  188 lb (85.276 kg)  BMI:  Body mass index is 27.76 kg/(m^2). Tech Comments: N/A    Nuclear Med Study 1 or 2 day study: 1 day  Stress Test Type:  Eugenie Birks  Reading MD: Cassell Clement, MD  Order Authorizing Provider:  B.Crenshaw  Resting Radionuclide: Technetium 31m Tetrofosmin  Resting Radionuclide Dose: 11.0 mCi   Stress Radionuclide:  Technetium 23m Tetrofosmin  Stress Radionuclide Dose: 33.0 mCi           Stress Protocol Rest HR: 73 Stress HR: 90  Rest BP: 143/72 Stress BP: 155/70  Exercise Time (min): n/a METS: n/a   Predicted Max HR: 147 bpm % Max HR: 61.22 bpm Rate Pressure Product: 57846   Dose of Adenosine (mg):  n/a Dose of Lexiscan: 0.4 mg  Dose of Atropine (mg): n/a Dose of Dobutamine: n/a mcg/kg/min (at max HR)  Stress Test Technologist: Milana Na, EMT-P  Nuclear Technologist:  Doyne Keel, CNMT     Rest Procedure:  Myocardial perfusion imaging was performed at rest 45 minutes following the intravenous administration of Technetium 84m Tetrofosmin. Rest ECG: SR with PVCS  Stress Procedure:  The patient received IV Lexiscan 0.4 mg over 15-seconds.  Technetium 81m Tetrofosmin injected at  30-seconds.  There were no significant changes and occ pvcs with Lexiscan.  Quantitative spect images were obtained after a 45 minute delay. Stress ECG: No significant change from baseline ECG  QPS Raw Data Images:  Normal; no motion artifact; normal heart/lung ratio. Stress Images:  There is decreased uptake in the inferior wall. Rest Images:  There is decreased uptake in the inferior wall. Subtraction (SDS):  There is a fixed inferior defect that is most consistent with diaphragmatic attenuation. Transient Ischemic Dilatation (Normal <1.22):0.99   Lung/Heart Ratio (Normal <0.45):  0.34  Quantitative Gated Spect Images QGS EDV:  144 ml QGS ESV:  81 ml QGS cine images: Mild inferior wall hypokinesis QGS EF: 44%  Impression Exercise Capacity:  Lexiscan with no exercise. BP Response:  Normal blood pressure response. Clinical Symptoms:  No chest pain. ECG Impression:  No significant ST segment change suggestive of ischemia. Comparison with Prior Nuclear Study: No significant change from previous study  Overall Impression:  Low risk stress nuclear study.  Old small inferior wall scar.  No reversible ischemia.  Mild LV dysfunction unchanged from previous study of 09/29/09.    Signed by Cassell Clement

## 2011-01-24 ENCOUNTER — Encounter (HOSPITAL_COMMUNITY): Payer: Self-pay | Admitting: Cardiology

## 2011-01-30 ENCOUNTER — Other Ambulatory Visit: Payer: Self-pay | Admitting: *Deleted

## 2011-01-30 MED ORDER — HYDRALAZINE HCL 50 MG PO TABS: 50.0000 mg | ORAL_TABLET | Freq: Two times a day (BID) | ORAL | Status: DC

## 2011-02-20 ENCOUNTER — Encounter: Payer: Self-pay | Admitting: Internal Medicine

## 2011-02-28 NOTE — Progress Notes (Signed)
[facility]                        PERIPHERAL VASCULAR OFFICE NOTE   NAME:[name] [name]                     MRN:          [id number]  DATE:06/06/2007                            DOB:          21-Mar-1936    [name] [name] was seen in followup at the [facility] Peripheral Vascular  Office on June 06, 2007.  [name] is a very nice 75 year old  gentleman who has atherosclerotic CAD and chronic kidney disease.  He  has also had chronic combined systolic and diastolic heart failure.  He  has been followed by Dr. Samule Ohm in the past and is to establish with Dr.  [name] for ongoing cardiac care.  [name] has had progressive  renal insufficiency and has had marked worsening of his kidney disease  with the use of an ACE inhibitor.  We attempted to evaluate him  noninvasively for renal artery stenosis with a renal ultrasound which  was performed back in 2007.  Unfortunately, the imaging quality was very  poor.  There was normal kidney size bilaterally with an 11.3-cm kidney  on the right and 10.5-cm kidney on the left.  The abdominal aorta and  renal arteries were not visualized due to technical difficulty with the  study.  [name] has been noted to have ongoing elevated blood  pressures as well.  He has no acute complaints today and denies any  chest pain, dyspnea, orthopnea, PND, or edema.   CURRENT MEDICATIONS:  1. Aspirin 325 mg daily.  2. Synthroid 150 mcg daily.  3. Plavix 75 mg daily.  4. Amlodipine 10 mg daily.  5. Furosemide 120 mg daily.  6. Actos 30 mg daily.  7. Simvastatin 40 mg daily.  8. Isosorbide 30 mg daily.  9. Glyburide 5 mg daily.  10.Clonidine 0.2 mg three times daily.  11.Calcitriol as needed.  12.Colchicine as needed.   ALLERGIES:  NKDA.   PHYSICAL EXAMINATION:  GENERAL:  He is alert and oriented in no acute  distress.  He is an obese [place] male.  VITAL SIGNS:  Weight 210 pounds, blood pressure  is 139/78, heart rate  60, respiratory rate is 18.  HEENT:  Normal.  NECK:  Normal carotid upstrokes without bruits.  Jugular venous pressure  is normal.  LUNGS:  Clear to auscultation bilaterally.  HEART:  Regular rate and rhythm without murmurs or gallops.  ABDOMEN:  Soft, obese, nontender.  No abdominal bruits.  EXTREMITIES:  No clubbing, cyanosis, or edema.  Pulses are 2+ and equal  in the periphery.   LABORATORY DATA:  Review from February 2008, showed a BUN of 40 and a  creatinine of 2.1.  Lipids demonstrated a total cholesterol of 152 with  an HDL of 57 and an LDL of 70.   ASSESSMENT:  [name] is a 75 year old gentleman with  atherosclerotic vascular disease and progressive chronic kidney disease  as well as difficult to manage hypertension.   I spoke with Dr. Briant Cedar on the telephone today and we agree that this  may be the best time to evaluate [name] for renal artery  stenosis with invasive angiography.  I reviewed this in detail with the  patient and his wife and went through the risks and indications of the  procedure.  With his refractory hypertension and progressive kidney  disease, especially in the setting of marked hypertension with an ACE  inhibitor, it certainly seems worthwhile to aggressively evaluate for  renal artery stenosis.   [name] will be admitted to the hospital the evening before the  procedure and hydrated.  We will treat him with Mucomyst.  We will plan  on a CO2 aortogram, followed by selective renal arteriography.  I will  attempt to minimize contrast as much as possible.   We will determine [name] followup pending his renal angiogram.     Veverly Fells. Excell Seltzer, MD  Electronically Signed    MDC/MedQ  DD: 06/06/2007  DT: 06/07/2007  Job #: [id number]   cc:   Dyke Maes, M.D.

## 2011-02-28 NOTE — Assessment & Plan Note (Signed)
[facility]                            CARDIOLOGY OFFICE NOTE   NAME:[name] [name], [name] [name]                     MRN:          [id number]  DATE:07/31/2007                            DOB:          [birth year]    [name] [name] is an extremely pleasant gentleman who has a history of  coronary disease.  He has had prior PCI of his right coronary artery.  His most recent catheterization in March 2005 showed a 40% left main, a  totaled ramus and a 95% followed by 90% right coronary artery.  The  patient had PCI of his right coronary artery at that time.  His most  recent Myoview was performed on January 24, 2007.  At that time his  ejection fraction was 46%.  There was a fixed defect in the inferior  wall without evidence of ischemia.  The patient also has a history of  renal insufficiency.  He recently underwent a CO2 aortogram and  selective renal angiography by Dr. Excell Seltzer on June 12, 2007.  His  distal aorta was normal as was his bilateral renal arteries.  Dr.  Briant Cedar follows the patient for his chronic renal insufficiency and  recently placed the patient on lisinopril.  Note, his most recent  echocardiogram was in January 2007 and his ejection fraction was thought  to be 45%.  The left atrium was mildly dilated as was the right atrium.  Since he was last seen he has mild dyspnea on exertion but there is no  orthopnea, PND, pedal edema, palpitations, presyncope, syncope or  exertional chest pain.   MEDICATIONS:  1. Aspirin 325 mg p.o. daily.  2. Synthroid 150 mcg daily.  3. Plavix 75 mg p.o. daily.  4. Amlodipine 10 mg p.o. daily.  5. Actos 30 mg p.o. daily.  6. Zocor 40 mg p.o. daily.  7. Imdur 30 mg p.o. daily.  8. Glyburide 5 mg p.o. daily.  9. Calcitriol.  10.Lasix 120 mg (80 in the morning and 40 in the evening).  11.Clonidine 0.2 mg p.o. t.i.d.  12.Colchicine 0.6 mg p.o. b.i.d. p.r.n.  13.Lisinopril 10 mg p.o. daily which was recently initiated  by Dr.      Briant Cedar.   PHYSICAL EXAMINATION:  Shows a blood pressure of 120/70 and his pulse is  80.  He weighs 213 pounds.  HEENT:  Normal.  NECK:  Supple.  CHEST:  Clear.  CARDIOVASCULAR:  Reveals a regular rate and rhythm.  ABDOMINAL:  Shows no tenderness to palpation.  His right groin shows no hematoma and no bruit from his recent  procedure.  EXTREMITIES:  Show no edema.   DIAGNOSES:  1. Coronary artery disease -- Mr.  Terhune appears to be doing well      from a symptomatic standpoint with no chest pain and shortness of      breath, and his recent Myoview was low risk.  We will continue with      medical therapy including his aspirin, Plavix and statin.  NOTE,      APPARENTLY HE HAS NOT TOLERATED BETA BLOCKERS IN THE PAST DUE TO  SIGNIFICANT BRADYCARDIA, EVEN AT VERY LOW DOSES PER DR. DOWNEY'S      NOTE.  2. History of systolic/diastolic congestive heart failure -- We will      continue with his present medications including his Lasix.  Per Dr.      Melinda Crutch notes he has been intolerant to angiotensin converting      enzyme inhibitors in the past due to markedly worsening renal      function.  However, Dr. Briant Cedar recently initiated this.  We will      check a BMET today and refer this to Dr. Briant Cedar.  3. Renal insufficiency -- This is per Dr. Edd Arbour discretion.  4. Hyperlipidemia -- He will continue on a statin and we will check      lipids and liver today.  Our goal LDL will be less than 70.  5. Diabetes mellitus -- Managed per Dr. Lovell Sheehan.  6. Hypertension -- His blood pressure is well controlled.  7. Hypothyroidism -- Per Dr. Lovell Sheehan.  8. History of gout.   I will see the patient back in 6 months.     Madolyn Frieze [name], MD, [facility]  Electronically Signed    BSC/MedQ  DD: 07/31/2007  DT: 08/01/2007  Job #: [id number]   cc:   Dyke Maes, M.D.

## 2011-02-28 NOTE — Progress Notes (Signed)
[facility]                        PERIPHERAL VASCULAR OFFICE NOTE   NAME:[name] [name], Caples                     MRN:          [id number]  DATE:09/09/2007                            DOB:          [birth year]    Gearldine Bienenstock returns for follow-up at the [facility] Peripheral Vascular  office on September 09, 2007.  Mr. Wandel is a very nice gentleman  with coronary artery disease and chronic kidney disease in the setting  of longstanding hypertension and diabetes.  He underwent renal  angiography on August 27, for suspicion of renal artery stenosis due to  intolerance to ACE inhibitors.  His renal angiography demonstrated a  normal distal aorta and widely patent bilateral renal arteries.  The  patient presents today for follow-up.  He reports reasonable blood  pressure control on his current medical therapy.  He has had no change  in medications.  He has had no chest pain or shortness of breath.  He  continues to follow with Dr. [name] and was recently seen in October  for his cardiac problems.   MEDICATIONS:  1. Aspirin 325 mg daily.  2. Synthroid 150 mcg daily.  3. Plavix 75 mg daily.  4. Amlodipine 10 mg daily.  5. Actos 30 mg daily.  6. Simvastatin 80 mg daily.  7. Isosorbide 30 mg daily.  8. Glyburide 5 mg daily.  9. Calcitriol 0.25 mcg two every other day alternating with one every      other day.  10.Furosemide 120 mg daily.  11.Clonidine 0.2 mg t.i.d.  12.Colchicine 0.6 mg b.i.d. as needed.  13.Lisinopril 10 mg daily.   ALLERGIES:  No known drug allergies.   PHYSICAL EXAMINATION:  GENERAL:  He is an alert and oriented, obese male  in no acute distress.  VITAL SIGNS:  Weight is 212, blood pressure 110/60 in both arms, heart  rate 68, and respiratory rate 12.  NECK:  JVP is normal.  LUNGS:  Clear to auscultation bilaterally.  HEART:  Regular rate and rhythm without murmurs or gallops.  ABDOMEN:  Soft, obese, nontender.  EXTREMITIES:  No cyanosis, clubbing, or edema.   ASSESSMENT:  This is a 75 year old gentleman with atherosclerotic  coronary artery disease and chronic kidney disease.  As noted above, he  does not have significant renal artery stenosis.  He is responding well  to medical therapy as directed by Dr. [name] and Dr. Briant Cedar.  I  would be happy to see the patient back in a p.r.n. basis but he does not  exhibit significant manifestations of peripheral or renovascular disease  at present.  It has been a pleasure participating in the care of the  patient and if I could be of any further assistance I would be more than  happy to see him back in the future.     Veverly Fells. Excell Seltzer, MD  Electronically Signed    MDC/MedQ  DD: 09/09/2007  DT: 09/09/2007  Job #: [id number]   cc:   Dyke Maes, M.D.  Madolyn Frieze [name], MD, [facility]

## 2011-02-28 NOTE — Consult Note (Signed)
NAMEAMARII, BORDAS NO.:  [id number]   MEDICAL RECORD NO.:  [id number]          PATIENT TYPE:  INP   LOCATION:  4708                         FACILITY:  [facility]   PHYSICIAN:  Lanell Matar, DO      DATE OF BIRTH:  1936/10/09   DATE OF CONSULTATION:  DATE OF DISCHARGE:                                 CONSULTATION   REQUESTING PHYSICIAN:  Virginia Rochester, M.D.   PRIMARY CARE PHYSICIAN:  Darryll Capers, M.D.   NEPHROLOGIST:  Dyke Maes, M.D.   PRIMARY CARDIOLOGIST:  Olga Millers, M.D., [facility].   REASON FOR CONSULTATION:  Bradycardia and dizzy episodes.   TIME:  0130 hours.   HISTORY OF PRESENT ILLNESS:  [name] [name] is a 75 year old African  American gentleman who reports having 3 to 4 episodes of dizziness  today.  The first episode occurred at approximately 9 a.m. on September 19, 2007 as the patient was returning his car to the driveway.  As the  patient approached the garage, he suddenly and without warning had a  sensation that his body was spinning.  This was accompanied by loss of  vision in both of his eyes and a feeling of nausea and weakness.  The  patient stopped the car, and the symptoms all resolved over 5 to 10  seconds.  The patient denies having any symptoms preceding this sudden  event.  He specifically denied having any chest pain, palpitations, or  any other symptoms that could represent angina or dysrhythmia.  He did  not have any abdominal pain or other feelings of bowel or bladder  incontinence or other symptoms that could be of a vagal nature.   The patient tells me that he had 3 or 4 additional episodes of having a  sense of the room spinning and loss of vision in both eyes and a feeling  of nausea over the course of the day.  However, these subsequent  episodes were all associated with standing.  Symptoms specifically began  within a few seconds of standing up in all other instances.  The patient  did not have any further  symptoms while sitting or lying down.  Additionally, over the course of the day, he did not have any chest pain  or other anginal equivalence, and no palpitations.  He has not had any  exercise intolerance, edema, orthopnea, or paroxysmal nocturnal dyspnea.  He has had no symptoms of claudication.  He has had no confusion, loss  of strength or sensation or function other than these transient episodes  described above.  He reports complete compliance with his medications.   PAST MEDICAL AND SURGICAL HISTORY:  1. Coronary artery disease.      a.     History of stenting of the right coronary artery in March       2005.  2. Systolic heart failure.  Ejection fraction 46% by Myoview January 24, 2007.  3. History of stroke with persistent right hemiparesis 2004.  4. Hypertension.  5. Chronic renal failure.  6. Diabetes mellitus, type 2.  7.  Hyperlipidemia.  8. History of thyroid surgery.  9. Gout.  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  11.Hypothyroidism.   FAMILY HISTORY:  The patient's mother died from complications of  diabetes in her 79s.  She additionally had several transient ischemic  attacks during her life.  The patient's father died of lung cancer at  age 82.  There is no history of premature  coronary disease in the  family.   SOCIAL HISTORY:  The patient reports that he smoked cigarettes for  approximately 5 years, but quit in 1950.  The patient denies any current  or past use of alcohol and drugs.  The patient is retired and lives at  home with his wife.  He has a number of children and grandchildren who  participate in his life.   ALLERGIES:  THERE ARE NO KNOWN DRUG ALLERGIES.   HOME MEDICATIONS:  1. Aspirin 325 mg daily.  2. Synthroid 150 mcg daily.  3. Plavix 75 mg daily.  4. Norvasc 10 mg daily.  5. Actos 30 mg daily.  6. Simvastatin 80 mg daily.  7. Imdur 30 mg daily.  8. Glyburide 5 mg daily.  9. Calcitriol 0.25  mg daily.  10.Lasix 120 mg daily.  11.Clonidine 0.2 three times a day.  12.Colchicine 0.6 mg twice a day.  13.Lisinopril 10 mg daily.   REVIEW OF SYSTEMS:  GENERAL:  The patient denies any fevers, chills, or  sweats.  He has had no changes in his weight lately.  GASTROINTESTINAL:  The patient reports that he has been troubled by constipation over the  last few weeks.  He finally has took a remedy recommended by a physician  that resolved this problem, but prior to that time he was having quite a  lot of difficulty with constipation.  A full 14-point review of systems  is obtained from the patient and is negative except as above.   PHYSICAL EXAMINATION:  VITAL SIGNS:  Temperature 97.0.  Pulse 56.  Blood  pressure 105/65.  Respiratory rate 14.  Oxygen saturation 95% on room  air.  GENERAL:  This is a well-developed, well-nourished, well-appearing,  overweight African American male.  He is in no apparent distress.  He  appears younger than his stated age.  HEENT:  Pupils are equal, round, and reactive to light and  accommodation.  Extraocular muscles are intact.  Arcus cornealis is  present.  There is no icterus or conjunctivitis.  Mucous membranes are  moist.  NECK:  No adenopathy, thyromegaly, or masses.  There is evidence of a  prior thyroid surgery scar to the low neck.  Jugular venous pressure  appears normal.  Carotid pulses are 2+ bilaterally without bruits.  RESPIRATORY:  There is no evidence of respiratory distress.  There is  normal inspiratory and expiratory phase.  There are a few dry crackles  at both bases, left greater than right, that I feel likely represent  atelectasis.  Percussion noted as normal throughout.  CARDIAC:  PMI is normal.  Heart sounds are distant.  There is normal S1  and S2.  I do not detect any murmurs, rubs, or gallops.  ABDOMEN:  Soft, obese, nontender, nondistended.  There are normal bowel  sounds.  There are no bruits.  I do not detect any masses  or  organomegaly.  The abdomen is somewhat firm due to some guarding that I  am unable to rule out improved with  maneuvers.  EXTREMITIES:  Warm and dry.  There is no cyanosis, clubbing, or edema.  I do not detect any clots or cords.  Dorsalis pedis pulses are 1+.  Radial pulses are 2+.  NEUROLOGICAL:  Patient is awake, alert, and oriented to time, place,  person, and situation.  There are no deficits of strength or sensation.   DIAGNOSTIC STUDIES:  Electrocardiogram:  I have personally reviewed the  patient's electrocardiogram.  This revealed sinus bradycardia, a rate of  56 beats per minute.  There is first degree AV block, but otherwise  normal electrocardiographic intervals.  There is no evidence of ischemia  or infarct.  There is no evidence of higher grade AV block.   LABORATORY STUDIES:  White blood cell count 3.2, hemoglobin 11.0,  platelets 130.  Sodium 137, potassium 5.6, chloride 108, CO2 23, BUN 90,  creatinine 3.0, glucose 70.  Troponin I #1 less than 0.05.  Troponin I  #2 less than 0.05.   I have reviewed the report from the chest x-ray dated September 19, 2007.  Report is of cardiomegaly and interstitial edema.  There is additionally  a right lower lobe opacity seen consistent with atelectasis.   In addition, I have reviewed the patient's head CT without contrast from  September 19, 2007.  No acute intracranial findings were described.  Chronic and unchanged findings of hypodensities in the right frontal  lobes and atherosclerotic calcifications of the carotid arteries were  again demonstrated.   ASSESSMENT:  A 75 year old male who has had 3 or 4 episodes of episodic  dizziness accompanied by nausea and transient binocular vision loss.   It is possible that transient severe bradycardia with hypotension could  explain this patient's symptoms.  However, other causes seem more  likely.  For instance, bradycardia does not typically produce a sense of  spinning as the patient  has had.  This symptom is more commonly related  to vertigo, stroke, TIA, or other central disorders.   RECOMMENDATIONS:  1. I agree with checking a TSH.  In addition, I would check a cortisol      level.  2. I agree with holding the patient's clonidine.  3. Check orthostatic vital signs.  4. The patient should be observed overnight on telemetry.  If the      patient does have transient appearance of high-grade AV block or      does have profound symptomatic bradycardia, he may benefit from a      pacemaker in the future.  5. He should have an echocardiogram in the morning to assess LV      systolic function and aortic valce function.  6. If the patient does not appear to have symptomatic bradycardia      causing these symptoms, other diagnostic testing for causes of      vertigo or transient ischemic attack could be considered.   Thank you for allowing [place] to participate in the care of your patient.      Lanell Matar, DO  Electronically Signed     KRD/MEDQ  D:  09/20/2007  T:  09/20/2007  Job:  [id number]

## 2011-02-28 NOTE — Consult Note (Signed)
[name], [name]:  [id number]   MEDICAL RECORD NO.:  [id number]          PATIENT TYPE:  INP   LOCATION:  4708                         FACILITY:  [facility]   PHYSICIAN:  Deanna Artis. Hickling, M.D.DATE OF BIRTH:  01/19/1936   DATE OF CONSULTATION:  09/21/2007  DATE OF DISCHARGE:                                 CONSULTATION   CHIEF COMPLAINT:  Loss of vision, unsteadiness, transient right-sided  weakness.   HISTORY OF PRESENT CONDITION:  [name] [name] is a 75 year old African  American gentleman who has had a prior silent stroke involving the right  frontal white matter.  This is sliver-like in nature and was present  both on MRI scan in 2007 and CT scan of the brain on this admission.   On admission, the patient reported that he had three or four episodes of  loss of vision as he tried to stand.  He was at work and came into the  emergency room.  The patient had a heart rate of 51 that dropped as low  as 37.  Blood pressure was also low at 105/65 and did not have a  definite trend.   The patient has a longstanding history (10-15 years) of hypertension,  and a past history of congestive heart failure, coronary artery disease,  chronic mild renal failure, type 2 diabetes mellitus.   The patient has been evaluated by the [facility] and they do  not believe that bradycardia was an etiology for his dysfunction.  The  possibility of hypotension with hypoperfusion of the brain seems more  reasonable.  It was in that setting that I was asked to see the patient  to determine whether there is evidence of vertebrobasilar insufficiency  and to make recommendations for further workup and treatment.   As mentioned above, the patient had a CT scan of the brain which shows a  remote sliver-like infarction in the right frontal superficial  subcortical white matter.  The patient has creatinine of 3, a BUN of 90,  indicating both chronic renal failure and mild  dehydration.   The patient said that he had a headache around the time of his loss of  vision, but that it went away rather quickly and was not particularly  severe.  He said that he felt nauseated and dizzy.  Some observers  elicited a history of vertigo, but the patient denies true vertigo.  He  noted that he has from time to time been weak on the right side and that  it seemed to him that he was weak during this episode.  On evaluation,  however, he has not shown any focal neurologic deficits.   REVIEW OF SYSTEMS:  Negative for dysphagia, diplopia, tinnitus, loss of  bowel or bladder control, seizures or change in his hearing.   Patient has not had any intercurrent infections of the head, neck lungs,  GI, GU.  He has not had recent chest pain, palpitations, shortness of  breath nor has he had any significant problems with osteo or rheumatoid  arthritis..  The remainder of his 12-system review is negative except  as  noted above.   The patient's most recent echocardiogram is pending.  Prior to that, his  ejection fraction was 40% which is low.  The patient had thyroid nodules  that were removed a number of years ago.  He failed to take radioactive  iodine and had to have a separate operation.   In 2006, he was hospitalized for about three months with a pneumonia  that in all likelihood caused a SIRS-like syndrome.  He wound up on a  ventilator for a prolonged period of time and needed a tracheostomy.  He  has had coronary stents placed in 2007.  He was scheduled to have a  arterial stents placed in the arteries to his kidneys, but angiogram  failed to show definite blockage and this procedure was not completed.   The patient supposedly had carotid Dopplers checked in the recent past  but there is no record of this.  He has had 2-D echocardiogram and  carotid Doppler carried out on Friday.  These studies are pending at  this time.   CURRENT MEDICATIONS:  1. Aspirin 325 mg  daily.  2. Synthroid 150 mcg daily.  3. Plavix 75 mg daily.  4. Norvasc 10 mg daily.  5. Actos 30 mg daily.  6. Simvastatin 80 mg daily.  7. Imdur 30 mg daily.  8. Glyburide 25 mg daily.  9. Calcitriol 0.25 mg daily.  10.Lasix 120 mg daily.  11.Clonidine 0.2 mg three times daily.  12.Colchicine 0.6 mg twice daily.  13.Lisinopril 10 mg daily.   I believe that he has had some of his antihypertensives discontinued.   DRUG ALLERGIES:  None known.   FAMILY HISTORY:  His mother had kidney disease, hypertension and stroke.  She died of a kidney failure on her 70th birthday.  Father died of lung  cancer when he was 51.   SOCIAL HISTORY:  The patient has a remote history of smoking but quit 40  years ago.  He does not use alcohol or drugs.  He is married (second  marriage).  He has been with his current wife for 40 years and married  for 58 of them.  He has three children by this marriage in three  children by previous marriage.  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  He is retired at this time.   PHYSICAL EXAMINATION:  VITAL SIGNS:  Temperature 97.9.  Blood pressure  lying down 151/83, resting pulse 63; sitting 163/86, resting pulse 66;  standing 189/90, resting pulse 70; standing for 2 minutes longer 182/96,  resting pulse 93.  Capillary glucose 65.  HEENT:  No infections.  No bruits.  LUNGS:  Clear.  HEART:  No murmurs.  Pulses normal.  ABDOMEN:  Soft.  Bowel sounds normal.  No hepatosplenomegaly.  EXTREMITIES:  Unremarkable.  NEUROLOGIC:  The patient was awake, alert, attentive, appropriate  without dysphasia.  Cranial nerves:  Round reactive pupils.  Visual  fields full extraocular movements full.  Symmetric facial strength,  midline tongue, air conduction greater than bone conduction bilaterally.   Motor examination:  Normal strength, tone and mass.  Fine motor  movements are normal.  No drift.  Sensory examination:  Peripheral  polyneuropathy good  stereognosis.  Cerebellar examination:  Good finger-  to-nose, rapid repetitive movements.  Gait was fine, however, he has a  poor tandem and lost his balance.  Romberg was negative.  He could get  up on his heels and toes.  Deep  tendon reflexes were diminished to  absent.  The patient had bilateral flexor plantar responses.   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  He has hypertension and type 2 diabetes mellitus as risk  factors for stroke.  He also as hypothyroid as a result of ablation  procedures.   Finally, he has dyslipidemia.  He has had evidence of peripheral  vascular and cardiovascular disease and may also have atherosclerotic  cerebrovascular disease.   RECOMMENDATIONS:  MRI scan of the brain, MRA intracranial and review the  2-D echo and Doppler.  He can be discharged after the MRI scan is done  and followed up as an outpatient.  I would not change his medications at  this time.  I doubt that there is anything that we will do with regards  to occlusion of cerebral vessels at this time.  This is more a matter of  making certain that his blood pressure stays up and that he stays well-  hydrated.  I appreciate the opportunity to participate in his care.  If  you have questions or I can be of assistance, do not hesitate to contact  me.      Deanna Artis. Sharene Skeans, M.D.  Electronically Signed     WHH/MEDQ  D:  09/21/2007  T:  09/22/2007  Job:  [id number]   cc:   Madolyn Frieze. [name], MD, [facility]  Gordy Savers, MD

## 2011-02-28 NOTE — Assessment & Plan Note (Signed)
[facility]                            CARDIOLOGY OFFICE NOTE   NAME:[name] [name]                     MRN:          [id number]  DATE:09/27/2007                            DOB:          Sep 08, 1936    [name] is a pleasant gentleman that I follow for coronary  disease.  He was recently admitted to [facility] with near  syncope.  There was concern that it may be bradycardia mediated but I  did review the symptoms and did not feel that was the case.  We did have  an echocardiogram performed that showed an ejection fraction of 50% -  55%.  There was no significant valvular abnormalities noted.  Note, the  patient was seen by Neurology, it was felt that he may have had  transient vertebral basilar insufficiency secondary to bradycardia and  hypotension.  Note, his clonidine was held during the entire hospital  stay and he remained asymptomatic.  An MRA was negative.  He did have a  monitor placed after discharge.  Since then he has not had any further  presyncopal episodes.  There is no dyspnea, chest pain, palpitations and  there is no pedal edema.   MEDICATIONS:  1. Lasix 80 mg p.o. daily.  2. Aspirin 325 mg p.o. daily.  3. Glyburide 5 mg p.o. daily.  4. Synthroid 175 mcg p.o. daily.  5. Zocor 40 mg p.o. daily.  6. Plavix 75 mg p.o. daily.  7. Norvasc 10 mg p.o. daily.  8. Actos 30 mg daily.  9. Calcitriol.  10.Imdur 30 mg p.o. daily.  11.Colchicine.   PHYSICAL EXAMINATION:  Today shows a blood pressure of 136/87 and his  pulse is 88.  He weighs 210 pounds.  HEENT:  Normal.  NECK:  Supple.  CHEST:  Clear.  CARDIOVASCULAR:  Reveals a regular rate and rhythm.  ABDOMINAL:  Shows no tenderness.  EXTREMITIES:  Show no edema.   I do have laboratories from September 26, 2007 that showed a total  cholesterol of 132 with an LDL of 65 and an HDL of 48.  His liver  functions were normal.  His most recent BUN and creatinine were 51  and  2.6.  A potassium was 6.2 but a followup was 5.5 and Dr. Lovell Sheehan is  managing those issues.   DIAGNOSES:  1. Recent presyncopal episodes -- These were most likely to be      secondary to transient vertebral basilar insufficiency secondary to      decreased blood pressure.  There is no evidence that a decreased      heart rate caused this.  He has got a CardioNet monitor at present      and we will follow this for any significant arrhythmias.  2. Coronary artery disease -- He had a Myoview performed on January 24, 2007.  There was an inferior wall defect without ischemia.  His      ejection fraction was 46% and we will continue with his aspirin,      statin and Plavix.  Note, HE HAS  NOT TOLERATED BETA BLOCKERS IN THE      PAST SECONDARY TO BRADYCARDIA and he is not on angiotensin      converting enzyme inhibitors secondary to worsening renal      insufficiency.  3. History of systolic/diastolic congestive heart failure -- He will      continue on his present dose of Lasix.  4. Renal insufficiency -- Dr. Briant Cedar is following this.  His      angiotensin converting enzyme inhibitor has been discontinued.  5. Hyperlipidemia -- He will continue on his statin.  6. Diabetes mellitus -- Per Dr. Lovell Sheehan.  7. Hypertension -- His blood pressure is adequately controlled.  8. Hypothyroidism -- Per Dr. Lovell Sheehan.  9. History of gout.   I will see him back in 6 months or sooner if his CardioNet monitor shows  significant abnormalities.     Madolyn Frieze [name], MD, [facility]  Electronically Signed    BSC/MedQ  DD: 09/27/2007  DT: 09/28/2007  Job #: [id number]

## 2011-02-28 NOTE — Assessment & Plan Note (Signed)
[facility]                            CARDIOLOGY OFFICE NOTE   NAME:WILLIAMSONChristyan, [name]                     MRN:          [id number]  DATE:12/23/2008                            DOB:          04/26/1936    HISTORY OF PRESENT ILLNESS:  [name] [name] is a pleasant 75 year old  gentleman with history of coronary artery disease.  His most recent  catheterization was in March 2005 and that showed a 40% left main, no  significant disease in the circumflex, total occlusion of the ramus  branch with a 50% first marginal, 40% proximal, followed by 95% mid, and  90% mid distal stenosis in the right coronary artery.  The patient had  successful PCI of the right coronary artery both the mid and mid-to-  distal right lesions.  His last Myoview was performed on January 24, 2007.  His ejection fraction is 46%.  There was a fixed defect in the inferior  wall without ischemia.  His last echocardiogram on September 20, 2007  showed normal LV function.  There was mild aortic root dilatation.  Since I last saw him he is doing well symptomatically.  There is no  dyspnea, exertional chest pain, palpitations, or syncope.  There is no  pedal edema.   MEDICATIONS:  1. Aspirin 325 mg p.o. daily.  2. Glyburide 5 mg p.o. daily.  3. Zocor 40 mg p.o. daily.  4. Plavix 75 mg p.o. daily.  5. Norvasc 10 mg daily.  6. Actos 30 mg p.o. daily.  7. Calcitriol.  8. Imdur 30 mg p.o. daily.  9. Levothyroxine 200 mcg p.o. daily.  10.Lasix 80 in the morning and 40 in the evening.  11.Bystolic 5 daily.  12.Lisinopril 20 mg p.o. daily.   PHYSICAL EXAMINATION:  VITAL SIGNS:  Today shows a blood pressure of  180/90, the pulse of 64.  HEENT:  Normal.  NECK:  Supple.  CHEST:  Clear.  CARDIOVASCULAR:  Regular rate and rhythm.  ABDOMEN:  Shows no tenderness.  EXTREMITIES:  Show no edema.   His electrocardiogram shows a sinus rhythm at a rate of 64.  There is a  first-degree AV block.  The  axis is normal.  There are no ST changes  noted.   DIAGNOSES:  1. Coronary artery disease - [name] [name] is not having chest pain      and his most recent Myoview showed no ischemia.  We will continue      medical therapy including his aspirin, Plavix, statin, beta blocker      and ACE inhibitor.  2. History of systolic versus diastolic heart failure - He will      continue his present dose of Lasix.  3. Renal insufficiency - This is being monitored by Dr. Briant Cedar.  4. Hyperlipidemia - He will continue on statin and this is being      monitored by Dr. Lovell Sheehan.  5. Diabetes mellitus.  6. Hypertension - His blood pressure is elevated today, but has not      taken his medications as of yet.  This will be tracked and his  regimen adjust as indicated.  7. Hypothyroidism.  8. History of gout.   He will see [place] back in 9 months.     Madolyn Frieze [name], MD, [facility]  Electronically Signed    BSC/MedQ  DD: 12/23/2008  DT: 12/23/2008  Job #: [id number]

## 2011-02-28 NOTE — Discharge Summary (Signed)
[name] [name], [name] [name] NO.:  [id number]   MEDICAL RECORD NO.:  [id number]          PATIENT TYPE:  INP   LOCATION:  4708                         FACILITY:  [facility]   PHYSICIAN:  Gordy Savers, MDDATE OF BIRTH:  1936-05-19   DATE OF ADMISSION:  09/19/2007  DATE OF DISCHARGE:  09/22/2007                               DISCHARGE SUMMARY   FINAL DIAGNOSES:  1. Vertebrobasilar insufficiency.  2. Hypertension.  3. Diabetes mellitus, type 2.  4. Bradycardia.  5. Coronary artery disease.  6. History of congestive heart failure.  7. Hypothyroidism.   DISCHARGE MEDICATIONS:  1. Actos 30 mg daily.  2. Aspirin 325 daily.  3. Zocor 40 mg daily.  4. Plavix 75 mg daily.  5. Levothyroxine 150 mcg daily.  6. Glyburide 5 mg daily.   MEDICINES HELD THE TIME OF DISCHARGE:  Clonidine.   HISTORY OF PRESENT ILLNESS:  The patient is a 75 year old African  American male with history of coronary disease, systolic heart failure,  chronic renal insufficiency and diabetes.  He was admitted after an  episode of generalized weakness associated with transient visual loss.  He became quite weak, unsteady, initially improved with sitting down,  but symptoms returned with attempts at ambulation.  He presented to the  emergency room for evaluation, where he was noted to have pulse rates as  low as 37.  ED evaluation included a negative head CT, negative chest x-  ray and laboratory studies unremarkable, except for creatinine of 3.  Cardiac markers were normal.  The patient was admitted for further  evaluation and treatment of his presyncope.   LABORATORY DATA AND HOSPITAL COURSE:  The patient was admitted to a  telemetry setting, where he was seen in consultation by Cardiology and  Neurology.  It was felt that the patient experienced vertebrobasilar  insufficiency probably secondary to bradycardia associated hypotension.  During the hospital period, his clonidine was held and during  the entire  hospital stay, he remained asymptomatic.  Neurologically, he had no  focal weakness on exam and had no new neurological symptoms during the  period of observation.  A chest x-ray revealed cardiomegaly and some  mild interstitial edema.  There was some right lower lobe atelectasis.  MRI of the brain was unremarkable except for small vessel ischemic  changes.  MRA was negative.  Electrocardiogram revealed marked sinus  bradycardia with a first-degree AV block, but no acute changes.   DISPOSITION:  The patient will be discharged today.  He has been asked  to follow up with his primary care [name] within the next 3 days.  He  will be discharged basically on his preadmission medications, except his  clonidine will be held.      Gordy Savers, MD  Electronically Signed     PFK/MEDQ  D:  09/22/2007  T:  09/23/2007  Job:  [id number]
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
## 2011-02-28 NOTE — Op Note (Signed)
NAMERUMALDO, DIFATTA NO.:  [id number]   MEDICAL RECORD NO.:  [id number]          PATIENT TYPE:  INP   LOCATION:  2021                         FACILITY:  [facility]   PHYSICIAN:  Veverly Fells. Excell Seltzer, MD  DATE OF BIRTH:  03/28/1936   DATE OF PROCEDURE:  06/12/2007  DATE OF DISCHARGE:  06/12/2007                               OPERATIVE REPORT   PROCEDURE:  CO2 aortogram, selective renal angiography.   INDICATION:  Mr. Canizalez is a 75 year old gentleman who has developed  progressive renal disease.  His creatinine has ranged from 2 to 3.  He  has refractory hypertension on multiple medicines and has been  intolerant to ACE inhibitors and due to development of marked azotemia  associated with their use.  After review we elected to proceed with  renal angiography to rule out renal artery stenosis as reversible cause  of his progressive kidney disease.   Risks, risks and indications of the procedure were reviewed in detail  with the patient.  Informed consent was obtained.  The right groin was  prepped, draped and anesthetized with 1% lidocaine.  Using a modified  Seldinger technique, a 5-French sheath was placed in the right femoral  artery.  The pigtail catheter was inserted into the abdominal aorta just  above the renal arteries.  A CO2 aortogram was performed using CO2 and a  60 mL syringe.  Following aortography a 5-French LIMA catheter was  inserted selectively into both the right and left renal arteries.  There  were no gradients on pullback into the aorta.  Selective renal  angiography was performed with mixed saline and contrast.  There was  less than 10 mL of contrast used for the procedure.   FINDINGS:  Aortic pressure is 165/79 with a mean of 111.   Suprarenal aortography demonstrated a normal abdominal aorta with widely  patent renal arteries.  Selective renal arteriography demonstrated  single bilateral renal arteries, both of which were widely patent  with  no significant stenoses.  There was no pressure gradient on pullback  into the aorta on either side.   FINAL CONCLUSION:  Normal distal aorta and bilateral renal arteries.      Veverly Fells. Excell Seltzer, MD  Electronically Signed     MDC/MEDQ  D:  06/12/2007  T:  06/12/2007  Job:  [id number]   cc:   Dyke Maes, M.D.

## 2011-02-28 NOTE — H&P (Signed)
NAMEXAVIAR, LUNN NO.:  [id number]   MEDICAL RECORD NO.:  [id number]          PATIENT TYPE:  INP   LOCATION:  1828                         FACILITY:  [facility]   PHYSICIAN:  Hollice Espy, M.D.DATE OF BIRTH:  07/14/1936   DATE OF ADMISSION:  09/19/2007  DATE OF DISCHARGE:                              HISTORY & PHYSICAL   PRIMARY CARE PHYSICIAN:  Stacie Glaze, M.D.   NEPHROLOGIST:  Dyke Maes, M.D.   CARDIOLOGIST:  Madolyn Frieze. [name], MD, [facility]   CHIEF COMPLAINT:  Weakness and vision loss.   HISTORY OF PRESENT ILLNESS:  The patient is a 75 year old white male  with past medical history of CHF, CAD, chronic renal failure, and  diabetes mellitus who earlier on today noted some episodes of just  generalized weakness and also some brief episodes of transient bilateral  vision loss.  His vision would go out for a few seconds and then come  back.  He is quite concerned and came into the emergency room.  He noted  no episodes of shortness of breath or chest pain and when he came into  the emergency room, he was noted initially to have a heart rate of 51,  but has dropped to as low as 37.  His blood pressure initially was  105/65 and it has only come to as high as 124/68 before trending back to  110/50.  He had labs drawn which were essentially unremarkable including  a negative CT, negative chest x-ray, no signs of infection, although his  BUN was noted to be 90 with a creatinine of 3 and elevated CPK level of  normal MB and troponin.  When compared to previous labs, his BUN has  been around 65, creatinine around 2.1 which shows that this is  definitely an elevation from what should be around his baseline.  The  patient himself tells me he feels quite weak, but otherwise is doing  well.  He currently says that his vision is okay.  He denies any  headache, no dysphagia, no chest pain or palpitations, no shortness of  breath, no wheezing or coughing, no  abdominal pain, no hematuria or  dysuria, no constipation or diarrhea, no focal extremity numbness,  weakness, or pain.   REVIEW OF SYSTEMS:  Otherwise negative.   PAST MEDICAL HISTORY:  Chronic renal failure, diabetes, hypertension,  CAD, CHF with an ejection fraction of 40%, hypothyroidism, CAD status  post stent placement, and negative for a history of renal artery  stenosis.  He tells me that he has had his carotid Dopplers checked in  the last few months when they checked his renal vessels, but I can not  find any report of this.   MEDICATIONS:  1. Aspirin 325 mg.  2. Synthroid 150 mcg.  3. Plavix 75 mg.  4. Norvasc 10 mg.  5. Actos 30 mg.  6. Simvastatin 80 mg.  7. IMDUR 30 mg.  8. Glyburide 5 mg.  9. Calcitriol 0.25 mg.  10.Lasix 120 mg.  11.Clonidine 0.2 t.i.d.  12.Colchicine 0.6 mg b.i.d.  13.Lisinopril 10 mg.  These  are based from a [place] office note from      the peripherovascular disease office on September 09, 2007,      approximately two weeks ago.   ALLERGIES:  No known drug allergies.   SOCIAL HISTORY:  No current tobacco, alcohol, or drug use.   FAMILY HISTORY:  Noncontributory.   PHYSICAL EXAMINATION:  VITAL SIGNS:  Temperature 97, heart rate 51,  blood pressure 105/65, his heart rate has gone down to as low as 37,  respirations 20, O2 saturations 95% on room air.  GENERAL:  He is alert and oriented x3 in no acute distress.  HEENT:  Normocephalic and atraumatic.  His mucous membranes are dry.  He  has no carotid bruits.  HEART:  Regular rhythm, bradycardic, it appears to be sinus bradycardia.  LUNGS:  Clear to auscultation bilaterally.  ABDOMEN:  Soft, nontender, and slightly obese.  Positive bowel sounds.  EXTREMITIES:  No clubbing or cyanosis.  Trace pitting edema.   LABORATORY DATA:  White count 3.2, H&H 11 and 32, MCV 92, platelet count  130, 50% neutrophils. Sodium 137, potassium 5.6, chloride 108, bicarb  22, BUN 90, creatinine 3, glucose 70.   CPK 332, MB 1.1, troponin I less  than 0.05.  Subsequent sets are similar.  I have ordered a TSH which is  pending.   Chest x-ray shows chronic changes, no evidence of any acute infiltrate.  CT of the head shows no acute intracranial findings.   ASSESSMENT:  1. Weakness, multifactorial.  I suspect this may be from his      hypotension and possibly from bradycardia.  2. Episodes of vision loss suspect secondary to decreased perfusion      associated with unconfirmed patient reports of a negative bilateral      carotid Dopplers.  Plan to hydrate, keep an eye on his blood      pressure and heart rate.  3. Bradycardia.  The only medicine that would account for him being      even mildly bradycardic would be clonidine.  I would ask for a      cardiology consult and check a TSH.  There is reports that the      patient has been extremely sensitive to beta blockers but there is      no evidence that he is on any beta blockers now.  4. Acute on chronic renal failure.  Hydrate and continue to follow.  5. History of congestive heart failure and coronary artery disease      stable.  6. Hypothyroidism, check a TSH.  7. Diabetes mellitus.  Check A1C.  Note, the patient's blood sugar on      admission was 70.  He may have episodes of hypoglycemia as well.      Hollice Espy, M.D.  Electronically Signed     SKK/MEDQ  D:  09/19/2007  T:  09/20/2007  Job:  [id number]   cc:   Stacie Glaze, MD  [address] [place]  [place] [address]   Dyke Maes, M.D.  Fax: [phone]   Madolyn Frieze. [name], MD, [facility]  [address]  [address]  [place]  [place] [address]

## 2011-03-03 NOTE — Cardiovascular Report (Signed)
NAME:  [name] [name], [name] [name]                        ACCOUNT NO.:  [id number]   MEDICAL RECORD NO.:  [id number]                   PATIENT TYPE:  INP   LOCATION:  6522                                 FACILITY:  [facility]   PHYSICIAN:  Charlies Constable, M.D. [facility]              DATE OF BIRTH:  November 09, 1935   DATE OF PROCEDURE:  01/07/2004  DATE OF DISCHARGE:                              CARDIAC CATHETERIZATION   CLINICAL HISTORY:  Mr. Melchior is 75 years old and had a remote cardiac  catheterization at which time he was told he had a scar on his heart.  This  was done by Dr. Alanda Amass many years ago.  He was admitted to the hospital  with chest pain that was initially felt to be atypical for ischemia, but he  had a Cardiolite scan which showed inferior scar and lateral ischemia.  He  was scheduled for catheterization.  His creatinine was 1.7.   PROCEDURE:  The procedure was performed via the right femoral artery using  arterial sheath and 6 French preformed coronary catheters. A front wall  arterial puncture was performed and Omnipaque contrast was used.  After  completion of diagnostic study, made decision to proceed with intervention  on the right coronary artery.  No left ventriculogram was performed to save  contrast.   We used an AL-1 6 [place] guiding catheter with side holes because we thought  it might be difficult to advance a long stent down to the distal right  coronary artery without good support.  We used a PT2 light support wire.  We  crossed the lesions with the wire without much difficulty.  We predilated  with a 2.75 x 20-mm Maverick performing two inflations up to 10 atmospheres  for 30 seconds.  We then deployed a 2.75 x 32-mm Taxus stent in the mid to  distal vessel deploying this one inflation of 12 atmospheres for 30 seconds.  We then post dilated with a 2.75 x 20-mm Quantum Maverick performing two  inflations of 18 and 16 atmospheres for 30 seconds.  Repeat diagnostic  studies  were then performed through the guiding catheter.  The patient  tolerated the procedure well and left the laboratory in satisfactory  condition.   RESULTS:  The aortic pressure was 194/93 with mean of 132.  Left ventricular  pressure was 173/12.   Left main coronary artery:  The left main coronary artery had a 40% distal  stenosis.   Left anterior descending artery:  The left anterior descending artery gave  rise to two diagonal branches and two septal perforators.  The LAD was  irregular, but there was no major obstruction.   Circumflex artery:  The circumflex artery gave rise to a ramus branch which  was totally occluded near its origin, marginal branch which had a 50%  proximal stenosis and a posterior lateral branch.  The posterior lateral  branch was free of significant obstruction.  The circumflex artery filled a  posterior lateral branch of the right coronary artery by collaterals.   Right coronary artery:  The right coronary artery was a moderately large  vessel that gave rise to a right ventricular branch, posterior descending  branch and a posterior lateral branch.  There was 40% proximal stenosis and  there was 90% stenosis in the mid and 90% stenosis in the mid to distal  vessel.  There was TIMI-2 flow distally.   No left ventriculogram was performed.  Ejection fraction was 40% by  Cardiolite.   Following percutaneous transluminal coronary angioplasty and stenting of the  lesions in the mid to distal right coronary artery, the stenoses improved  from 95 and 90% to 0% with one long stent.   CONCLUSION:  1. Coronary artery disease with 40% narrowing in the left main coronary     artery, no significant obstruction in the circumflex artery, total     occlusion of the ramus branch of the circumflex artery with 50% stenosis     in the first marginal branch, 40% proximal and 95% mid and 90% mid to     distal stenosis in the right coronary artery.  2. Successful stenting of  the lesions in the mid and mid to distal right     coronary with improvement with stent  renarrowing from 95% and 90% to 0%.   DISPOSITION:  The patient was returned to the postangioplasty unit for  further observation.                                               Charlies Constable, M.D. [facility]    BB/MEDQ  D:  01/07/2004  T:  01/08/2004  Job:  [id number]   cc:   Stacie Glaze, M.D. [facility]   [name], M.D.  [address]  [address]  [place]  [place] [address]

## 2011-03-03 NOTE — Discharge Summary (Signed)
NAMEMICHIEL, SIVLEY NO.:  [id number]   MEDICAL RECORD NO.:  [id number]          PATIENT TYPE:  INP   LOCATION:  4741                         FACILITY:  [facility]   PHYSICIAN:  Rene Paci, M.D. LHCDATE OF BIRTH:  August 05, 1936   DATE OF ADMISSION:  03/23/2005  DATE OF DISCHARGE:  03/27/2005                                 DISCHARGE SUMMARY   DISCHARGE DIAGNOSES:  1.  Chest pain, resolved.  Myoview negative for ischemia.  2.  Congestive heart failure exacerbation with dyspnea on exertion.  3.  Acute on chronic renal insufficiency.  4.  Diabetes, type 2.  5.  Hypoxia secondary to congestive heart failure exacerbation, resolved.   HISTORY OF PRESENT ILLNESS:  The patient is a 75 year old African American  male with a prior history of coronary artery disease, who was stable until  the day prior to admission when he described visual disturbances, as well as  abdominal pain and change in bowel habits.  On the day of admission, the  patient reported right-sided chest pain which radiated to the shoulder,  accompanied by shortness of breath and wheezing.  The patient also reported  an episode of hemoptysis which resolved with nitroglycerin.  The patient's  chief complaints at the time of admission were of weakness and shortness of  breath.  The patient was admitted for further evaluation.   PAST MEDICAL HISTORY:  1.  Cardiac catheterization in March of 2005 with positive stent.  2.  Adenosine Myoview on January 23, 2005, with fixed inferior defect, no      ischemia and an ejection fraction of 46%.  3.  Hypertension.  4.  Diabetes, type 2.  5.  Gout.  6.  Hypothyroidism.   COURSE OF HOSPITALIZATION:  #1 - CHEST PAIN:  The patient was admitted on  March 23, 2005, for chest pain and weakness.  The patient underwent serial  cardiac enzymes which showed slightly elevated troponin at 0.11.  As a  result, cardiology was consulted.  The patient was placed on IV  nitroglycerin  and IV heparin.  Aspirin was continued, as well as statin.  The patient was also diuresed with Lasix.  The patient underwent adenosine  Myoview, which revealed an ejection fraction of 41%.  It showed no evidence  of ischemia.  It did show septal hypokinesia and a decreased left  ventricular ejection fraction at 41%.   #2 - CONGESTIVE HEART FAILURE:  The patient was maintained on beta blocker,  as well as ACE inhibitor, which was increased and Lasix was continued.  The  patient was hypoxic at the time of admission, however, at the time of  discharge after diuresis, O2 saturation is 98% on room air.   #3 - DIABETES, TYPE 2:  The patient was previously on metformin prior to  admission, however, this was held secondary to acute on chronic renal  insufficiency.  At this time, the patient is still maintained on Avandia 8  mg daily.  The patient was also started on Lantus 5 units subcutaneously  daily with improvement of CBGs.  However, as cost is an issue  for the  patient, doubt that Lantus would be an easy medication for the patient to  obtain and use as an outpatient.  It should be reserved as last resort.  Plan to continue Avandia and have the patient follow up with primary care  for additional diabetes management and perhaps addition of another oral  agent.  Will not discharge home with Lantus at this time.   #4 - ACUTE ON CHRONIC RENAL INSUFFICIENCY:  At the time of admission, the  patient's creatinine was 2.7.  At the time of discharge, the patient's  creatinine is 2.3.  The patient is to follow up with Dr. Lovell Sheehan on April 05, 2005.  At this time, a followup creatinine will need to be drawn for  followup.   MEDICATIONS AT DISCHARGE:  1.  Aspirin 325 mg daily.  2.  Catapres 0.3 mg three times a day.  3.  Avandia 8 mg p.o. daily.  4.  Enalapril 10 mg p.o. t.i.d.  5.  Synthroid 150 mcg p.o. daily.  6.  Lipitor 40 mg p.o. h.s.  7.  Lasix 20 mg daily.  8.  Imdur 30 mg daily.  9.   Coreg 3.125 mg twice daily.   The patient is not to take Glucophage or metformin secondary to elevated  creatinine.   LABORATORIES AT DISCHARGE:  BUN 56, creatinine 2.3, glucose 162.  Hemoglobin  11, hematocrit 33.6, white blood cell count 5.1, platelets 112.   FOLLOWUP:  The patient is to follow up with Stacie Glaze, M.D., on April 05, 2005, at 11:45 a.m.       MSO/MEDQ  D:  03/27/2005  T:  03/27/2005  Job:  [id number]   cc:   Stacie Glaze, M.D. [facility]

## 2011-03-03 NOTE — Discharge Summary (Signed)
NAME:  WYATT, THORSTENSON NO.:  [id number]   MEDICAL RECORD NO.:  [id number]          PATIENT TYPE:  INP   LOCATION:  4741                         FACILITY:  [facility]   PHYSICIAN:  Stacie Glaze, M.D. LHCDATE OF BIRTH:  October 21, 1935   DATE OF ADMISSION:  03/23/2005  DATE OF DISCHARGE:                                 DISCHARGE SUMMARY   ADDENDUM:  Upon reviewing admission notes, patient was originally on a  glyburide/metformin combination 5/500.  Will resume glyburide 5 mg p.o.  b.i.d., holding metformin and will also continue Avandia 8 mg daily for  blood sugar control.  In addition, patient was on Lasix 80 mg daily at home  which had been changed to 40 mg IV b.i.d. while in the hospital.  Yesterday,  Lasix was decreased to 20 mg secondary to hypotension.  However, today blood  pressure is 144/74.  Will place patient on Lasix 40 mg daily to prevent  patient from developing pulmonary edema, however higher dose of 80 mg will  need further evaluation as an outpatient.  Will defer to primary M.D.   SUMMARY:  1.  Glyburide 5 mg p.o. b.i.d.  2.  Lasix 40 mg p.o. daily.  3.  Avandia 8 mg daily.      Melissa S   MSO/MEDQ  D:  03/27/2005  T:  03/27/2005  Job:  [id number]

## 2011-03-03 NOTE — Assessment & Plan Note (Signed)
[facility]                              CARDIOLOGY OFFICE NOTE   [name] [name], [name] [name]                     MRN:          [id number]  DATE:04/26/2006                            DOB:          [birth year]    HISTORY OF PRESENT ILLNESS:  [name] [name] is a 75 year old gentleman with  coronary disease, chronic renal insufficiency, and combined systolic and  diastolic heart failure.   He is status post non ST elevation myocardial infarction in 2005 which I  treated with three overlapping drug-eluting stents in the right coronary  artery.  He has been free of any recurrent angina since then.  His ejection  fraction is 41%.   Over the past several months he has been troubled by exertional dyspnea  attributed to heart failure.  His diuretic dose was increased.  In that  setting his renal function worsened with creatinine increasing from 2.9 to  4.5.  Enalapril was discontinued.  Concern was raised as to whether his  bradycardia was contributing to a low cardiac output and in turn  contributing to his worsening renal function as well.  Coreg was therefore  decreased from 25 mg twice per day to 12.5 mg per day.  With these changes  his creatinine was most recently 3.6 two weeks ago.   In those two weeks he feels his dyspnea is quote good.  He seems to be [place] Heart Association class II, able to walk up a flight of steps, but  being modestly short of breath at the top.  He has not had any orthopnea,  PND, syncope, presyncope, or chest discomfort.   CURRENT MEDICATIONS:  1.  Actos 30 mg per day.  2.  Enteric-coated aspirin 325 mg per day.  3.  Imdur 30 mg per day.  4.  Lasix 80 mg once per day.  5.  Synthroid 150 mcg per day.  6.  Lipitor 40 mg per day.  7.  Norvasc 10 mg per day.  8.  Calcitriol 0.25 mcg per day.  9.  Coreg 12.5 mg twice per day.  10. Glyburide 5 mg per day.  11. Plavix 75 mg per day.   PHYSICAL EXAMINATION:  GENERAL:  He is  generally well-appearing in no  distress.  VITAL SIGNS:  Heart rate 46, blood pressure 116/60, and weight of 211  pounds.  NECK:  He has jugular venous pressure of 6 cm.  There is no thyromegaly.  LUNGS:  Respiratory effort is normal.  Lungs are clear to auscultation.  CARDIAC:  He is a nondisplaced point of maximal cardiac impulse.  There is a  regular rate and rhythm without murmur, rub, or S3.  ABDOMEN:  Soft, nondistended, nontender.  There is no hepatosplenomegaly.  No pulsatile midline mass and no abdominal bruit.  EXTREMITIES:  Warm without clubbing, cyanosis, edema, or ulceration.  Carotid pulses are 2+ bilaterally without bruit.   Electrocardiogram today demonstrates sinus bradycardia at 46 beats per  minute with first degree AV block (PR 222 milliseconds).  There is poor R-  wave progression across the precordium not different  than June 27.   IMPRESSION/RECOMMENDATIONS:  1.  Combined systolic and diastolic heart failure with ejection fraction      41%.  Previously the ACE inhibitor was discontinued with his worsened      renal function.  There was only a modest improvement with that change.      While we continue assessment of what has led to the change in his renal      function I will not restart that, but will leave that to [name] [name].      His beta blocker has been decreased by 50%.  However, he remains      substantially bradycardic.  As a test for whether this is contributing      to his renal dysfunction I will discontinue it entirely today.  2.  Renal insufficiency.  Previously attributed to hypertension and      diabetes.  Recent attempt to look at his renal arteries with duplex was      hindered by very poor imaging windows and was thus non-diagnostic.      Kidney sizes are 11.3 x 6 cm on the right and 10.5 x 5.1 on the left.      There was question of poor flow in the right kidney.  Will discuss with      [name] [name] whether we should consider proceeding with  CO2 angiography      for definitive exclusion of renal artery stenosis.  Will check BMET      today and then be in touch with [name] [name].  3.  Hypercholesterolemia.  Continue statin.  4.  Diabetes mellitus.  5.  Hypertension.  6.  Treated hypothyroidism.  7.  History of gout.                                 [name] Farber, MD    WED/MedQ  DD:  04/26/2006  DT:  04/26/2006  Job #:  [id number]   cc:   [name] Maes, MD  [name] Glaze, MD

## 2011-03-03 NOTE — Consult Note (Signed)
NAME:  [name] [name], DUER NO.:  [id number]   MEDICAL RECORD NO.:  [id number]          PATIENT TYPE:  INP   LOCATION:  1833                         FACILITY:  [facility]   PHYSICIAN:  Ok Anis, NPDATE OF BIRTH:  [birth year]   DATE OF CONSULTATION:  DATE OF DISCHARGE:                                   CONSULTATION   PRIMARY CARE PHYSICIAN:  Dr. Lovell Sheehan.   PRIMARY CARDIOLOGIST:  [name], M.D.   CHIEF COMPLAINT:  The patient is a 75 year old African-American male with  prior history of CAD who presents with unstable angina.   PAST MEDICAL HISTORY:  1 - Coronary artery disease.  January 05, 2004, non-ST  elevation MI followed by a functional study showing ejection fraction 30%  with basilar lateral ischemia.  January 07, 2004 cardiac catheterization/PCI  left main 40%, LAD normal, left circumflex normal, ramus 100%, OM1 50%, RCA  90% mid and distal.  Ejection fraction 40%.  The RCA was stented with a 2.75  x 32 mm Taxus drug-eluting  stent.  2 - Hypertension.  3 - Hyperlipidemia.  4 - Type 2 diabetes mellitus.  5 - Chronic renal insufficiency.  6 - Hypothyroidism, status post thyroidectomy.  7 - Congestive heart failure.  8 - Anemia.  9 - Cerebrovascular accident in 2004.  10 - History of hemoptysis secondary to pneumonia and possible pesticide  inhalation.   HISTORY OF PRESENT ILLNESS:  A 75 year old African-American male with  history of CAD/non-ST elevation MI in March of 2005, status post PCA and  stenting of the RCA at that time.  He had been doing well from a cardiac  standpoint until approximately 12:30 a.m. this morning when he felt a  fullness in his chest followed by coughing with hemoptysis noting a teaspoon  size amount of bright red blood in his sputum.  Following this, he began to  feel acutely short of breath requiring that he sit at the side of his bed  for a large portion of the night.  He eventually fell asleep and when he  woke up  this morning at about 7 a.m. he noted 10/10 substernal chest  pressure and sharpness radiating to the left shoulder associated with  shortness of breath.  He took a sublingual nitroglycerin with relief of  discomfort after approximately five minutes.  He then went on to work where  he works as a Veterinary surgeon and tires and had recurrent 1 to 2/10  chest pain associated with shortness of breath while working on a car.  He  sat down and the symptoms resolved after about an hour.  He then went to see  his PCP, had recurrent chest pain in the office and was sent on to the  emergency department where symptoms were relieved with intravenous  nitroglycerin.  He was then also placed on a heparin infusion.  He is  currently pain free.   ALLERGIES:  No known drug allergies.   CURRENT MEDICATIONS:  1.  Aspirin 325 mg q.d.  2.  Lipitor 40 mg at bedtime.  3.  Clonidine 0.3 mg  three times daily.  4.  Lasix 40 mg q.12h. IV.  5.  Avandia 8 mg q.d.  6.  Glyburide 5 mg twice daily.  7.  Sliding scale insulin.  8.  Enalapril 5 mg daily.  9.  Synthroid 0.15 mg daily.  10. Lopressor 25 mg twice daily.  11. Nitroglycerin 10 mcg per minute IV.  12. Heparin infusion per pharmacy protocol.   FAMILY HISTORY:  Mother died age 16 with diabetes.  Father died age 75 with  lung cancer.  He had eight sisters and four brothers.  There is coronary  artery disease and diabetes in his siblings.   SOCIAL HISTORY:  Lives in [place] with his wife and continues working as  a Curator.  He has six grown children.  He smoked less than a pack or day  for five or six years and quit in the 1960s.  He used to use alcohol heavily  but quit this also in the 1960s.  Denies any drug use and does not exercise.   REVIEW OF SYSTEMS:  Positive for chest pain, shortness of breath, dyspnea on  exertion, cough, wheezing, hemoptysis, and diarrhea following use of  colchicine for a recent gout flare.  All other systems  reviewed and were  negative.   PHYSICAL EXAMINATION:  VITAL SIGNS:  Temperature 97.1, heart rate 80,  respiratory rate 20, blood pressure 120/75.  Pulse ox is 93% on room air.  GENERAL:  Pleasant African-American male in no acute distress, awake, alert  and oriented times there.  NECK:  Normal carotid upstrokes, no bruits or JVD.  LUNGS:  Respirations regular and unlabored with crackles in bilateral bases,  left greater than right and actually the crackles on the left go about a  third of the way up.  CARDIOVASCULAR:  Regular S1, S2, no  S3 or S4 or murmurs.  ABDOMEN:  Round, soft, nontender, nondistended. Bowel sounds present times  four.  EXTREMITIES:  Warm and dry.  No cyanosis, clubbing or edema.  Dorsalis  pedis, posterior tibial pulses 2+ and equal bilaterally.  No femoral bruits  are noted.   Chest x-ray shows basilar infiltrate, likely pulmonary edema.  Electrocardiogram shows sinus rhythm, first degree AV block.  Downsloping ST  segments, v5 and v6 with T-wave inversion in leads 3 and aVF.  Heart rate 67  beats per minute.   LABORATORY DATA:  Hemoglobin 12.2, hematocrit 37.4, white blood cell count  5300, platelet count 118, sodium 139, potassium 4.5, chloride 109, CO2 22,  BUN 47, creatinine 2.7, glucose 228, total bilirubin 0.7, alkaline  phosphatase 49,  AST 32, ALT 24, CK 196, MB 3.0, troponin-I 0.10.  PT 12.9,  INR 1.0.   ASSESSMENT/PLAN:  1 - Unstable angina/coronary artery disease.  Symptoms are  concerning for unstable angina, complicated by mild heart failure.  He is  now pain free.  He is on IV nitro and heparin.  Will  continue aspirin and  statin ACE inhibitor as well as beta blocker.  Continue cycle enzymes.  His  creatinine is elevated at 2.7.  Will plan for catheterization during this  admission and possibly even in the a.m. realizing of course that he will  require bicarb protocol prior to catheterization. 2 - Congestive heart failure.  He is currently wet  on exam.  Agree with IV  diuresis, Lasix 40 IV twice daily.  Also will continue Ace inhibitor, beta  blocker.  Question ischemia as a cause of heart failure exacerbation.  3 -  Hypertension.  This is stable.  Continue current regimen.  4 - Hyperlipidemia.  Will add back his statin, check FLP and LFTs.  5 - Chronic renal insufficiency.  His creatinine is 2.7 and thus will need  pretreatment with sodium bicarb prior to  cath.  May even want to hold off on cath unless creatinine improves.  6 - Hemoptysis.  He has a previous history of hemoptysis a year ago, had a  chest CT at that time and did not show any masses.       CRB/MEDQ  D:  03/23/2005  T:  03/23/2005  Job:  [id number]

## 2011-03-03 NOTE — Discharge Summary (Signed)
NAMEMarland Kitchen  [name] [name], [name] [name]                        ACCOUNT NO.:  [id number]   MEDICAL RECORD NO.:  [id number]                   PATIENT TYPE:  INP   LOCATION:  0382                                 FACILITY:  [facility]   PHYSICIAN:  Joellyn Rued, P.A. [facility]              DATE OF BIRTH:  1936/09/16   DATE OF ADMISSION:  01/03/2004  DATE OF DISCHARGE:  01/08/2004                           DISCHARGE SUMMARY - REFERRING   SUMMARY OF HISTORY:  [name] [name] is a 75 year old African-American male  who is admitted by a primary MD service through [facility]  secondary to chest discomfort.  [name] [name] describes an episode of chest  discomfort at church yesterday at approximately 9:45.  While he was climbing  the stairs he suddenly became short of breath, developed an anterior chest  grabbing sensation with a tearing apart feeling.  He did not have any  radiation of the discomfort but he did have some diaphoresis.  He denies any  nausea or vomiting.  He gave the discomfort a 10 on a sale of 0-10 and he  denies prior occurrences.  He sat down in a classroom after finishing the  stairs and he felt like things were going black but he actually denies loss  of consciousness.  His wife states that a friend was calling his name but  the patient was not responding to his friend.  This lasted for several  seconds according to the wife and the patient does not recall his friend  calling his name.  His wife denies any associated seizures or incontinence  or actual loss of consciousness.  He was assisted to the car was driven to  the emergency room for further evaluation.  Prior to church, the patient ate  only a sandwich for his breakfast and he felt that this was much less than  usual and attributed the episode to low blood sugar.  However, the patient  has never had any prior occurrences of chest discomfort or acute shortness  of breath in the past.  He denies any exertional limitations.  Since  being  see and admitted the patient has felt better and has not had any further  reoccurrences.   PAST MEDICAL HISTORY:  Notable for noninsulin-dependent diabetes for seven  years.  He denies any associated -opathies, and states that his home sugars  are approximately in the 90s.  He also has a history of hypertension,  hypothyroidism, status post thyroidectomy, normocytic anemia.  Hospitalized  for pneumonia in February of 2004 and in May of 2004.  In February this was  complicated by ARDS and he was placed on a ventilator and had paroxysmal  atrial fibrillation.  In May of 2004 it was complicated by CHF.  An echo at  that time showed an EF of 50%-55%, no wall motion abnormalities, mild LVH,  left atrial enlargement, right ventricular enlargement and mild increased  PAP.  An adenosine  Cardiolite at that time showed an inferolateral thinning,  no ischemia and it was not gaited due to the atrial fibrillation.  He also  has a history of chronic renal insufficiency.  His creatinine in May of 2004  was 1.5.  He is status post bilateral cataract removal and he quit smoking  in the 1950s.   LABORATORY DATA:  Admission H&H was 12.7 and 38.2, normal indices.  Platelets 147, WBC is 5.87, PTT 33, PT 12.5, sodium 133, potassium 5.3, BUN  67, creatinine 2.1, glucose 258 and normal LFTs.  CK-MBs were negative for  myocardial infarction x4.  Initial troponin's were 0.11 and 0.19.  Subsequent troponin's were negative at 0.04 and 0.02.  TSH was low at 0.019  on the 21st.  Urinalysis was positive for glucose and protein.  Fasting  lipids obtained on the 22nd showed a cholesterol of 192, triglycerides 132,  HDL 46, LDL of 120.  Subsequent chemistries showed improvement of his BUN  and creatinine and at the time of discharge they were 27 and 1.6.  Prior to  catheterization on the 24th it was 36 and 1.7.  Subsequent hematologies were  unremarkable.  EKG showed cardiomegaly with questionable mild basilar   interstitial edema.  EKGs showed normal sinus rhythm, normal axis,  occasional PVC, early R-wave, nonspecific ST-T wave changes.  They remained  unchanged throughout his admission.   HOSPITAL COURSE:  [name] [name] was admitted to [facility] by Dr.  Amador Cunas.  Cardiology was consulted on January 04, 2004 to evaluate cardiac  status with abnormal troponin's.  The patient was seen in consultation by  Dr. Samule Ohm.  It was felt that with his slightly abnormal troponin's he may  have had a non-Q-wave myocardial infarction.  However, with his renal  insufficiency the initial plan was for a stress Cardiolite.  If this is  negative we would treat him for presumed coronary artery disease.  If this  is abnormal he would need to undergo cardiac catheterization with renal  protection.  A stress Cardiolite was performed on January 05, 2004 and showed  an EF of 40%, inferior wall fixed defect and lateral ischemia.  This is new  compared to his adenosine Cardiolite from last year and echocardiogram.  It  is felt that he should undergo a cardiac catheterization, thus he was placed  on sodium bicarb drip as well as receiving Mucomyst in hydration.  He has  resulted improvement in his BUN and creatinine.  On January 07, 2004 he  underwent cardiac catheterization by Dr. Juanda Chance.  This showed a distal 40%  left main, total ramus, 50% OM-1 branch, 40% proximal RCA, 90%-95% mid RCA.  LV gram was not performed.  He received one Taxus stent and non-drug-eluting  stent for a total of two to the mid RCA without difficulty.  __________  bedrest.  The patient was ambulating without difficulty and catheterization  site was intact.  On January 08, 2004 after a review by Dr. Antoine Poche his BUN  and creatinine was 27 and 1.6.  The patient was doing well and it was felt  that the patient could be discharged home.  It was also noted that during this admission he was started on a statin for his hyperlipidemia.  With his   low TSH his Synthroid was also decreased from 0.25 mg to 0.2 mg q.d.   DISCHARGE DIAGNOSES:  1. Non-Q-wave myocardial infarction, status post angioplasty and stenting of     the mid RCA as  previously described by cardiac catheterization.  2. Hyperlipidemia.  3. Hyperthyroidism secondary to medication for hypothyroidism.  4. Hyperglycemia with an elevated hemoglobin A1c of 8.5.  5. Chronic renal insufficiency.  6. Hypertension history, as previously.   DISPOSITION:  Mr. Jutte is discharged home.  Prior to discharge he will  be seen by cardiac rehab.  His new medications include; Plavix 75 mg q.d.  for six months, sublingual nitroglycerin 0.4 as needed, Vasotec 5 mg b.i.d.  and Lipitor 80 mg q.d. at bedtime.  His Synthroid was decreased to 0.2 mg  q.d.  He was asked to resume his Glucovance 5/500 mg b.i.d. on Sunday, January 10, 2004.  He was asked to continue his coated aspirin 325 mg q.d., Avandia  4 mg q.d., metoprolol 50 mg b.i.d. and clonidine 0.3 t.i.d.  He was advised  no lifting, driving, sexual activity, or heavy exertion for one week.  Maintain low salt, fat, cholesterol, ADA diet.  If he has any problems with  his catheterization site he was asked to call us.  He will have a renal  ultrasound on January 26, 2004 at 8:00 a.m.  He will also have a BMP drawn at  that time.  He will need fasting lipids, LFTs and TSH in approximately six  to eight weeks.  He will see Dr. Samule Ohm on February 05, 2004 at 10:15 a.m.  He  was also asked to arrange a two month appointment with Dr. Lovell Sheehan in  regards to his diabetes and his thyroid followup care.                                                Joellyn Rued, P.A. [facility]    EW/MEDQ  D:  01/08/2004  T:  01/09/2004  Job:  [id number]   cc:   [name], M.D.  [address]  [address]  [place]  [place] [address]   Dr. Lovell Sheehan

## 2011-03-03 NOTE — Op Note (Signed)
NAME:  [name] [name], [name] [name]                        ACCOUNT NO.:  [id number]   MEDICAL RECORD NO.:  [id number]                   PATIENT TYPE:  INP   LOCATION:  0159                                 FACILITY:  [facility]   PHYSICIAN:  Velora Heckler, M.D.                DATE OF BIRTH:  05/07/1936   DATE OF PROCEDURE:  12/05/2002  DATE OF DISCHARGE:                                 OPERATIVE REPORT   PREOPERATIVE DIAGNOSES:  Respiratory failure.   POSTOPERATIVE DIAGNOSES:  Respiratory failure.   PROCEDURE:  Tracheostomy.   SURGEON:  Velora Heckler, M.D.   ANESTHESIA:  General.   ESTIMATED BLOOD LOSS:  Minimal.   PREPARATION:  Betadine.   COMPLICATIONS:  None.   INDICATIONS FOR PROCEDURE:  The patient is a 75 year old black male on the  intensive care critical care medicine service of Dr. Sandrea Hughs. The  patient has persistent respiratory failure and need for mechanical  ventilation. The patient, his family and physicians desire tracheostomy  placement.   DESCRIPTION OF PROCEDURE:  The procedure was done in OR #12 at the [facility]. The patient was brought to the operating room on  the ventilator from the intensive care unit accompanied by respiratory  therapy. The patient was placed in supine position on the operating room  table. Following the administration of general anesthesia, the patient was  prepped and draped in the usual strict aseptic fashion. After ascertaining  that an adequate level of anesthesia had been obtained, the patient's  previous Kocher incision in the anterior neck is reopened with a #15 blade.  Dissection is carried down through scar tissue and subcutaneous tissues. The  trachea is identified. The cricoid ring is identified. Weitlander retractor  is placed for exposure. Good hemostasis is noted. Stay sutures of 2-0  Prolene are placed around the third and fourth tracheal rings. Tracheotomy  is made in the midline using a #15 blade and  curved Mayo scissors to divide  the cartilaginous rings. The endotracheal tube is visualized. Endotracheal  tube is advanced back to the level of the larynx under direct vision. An 8  Shiley cuff tracheostomy tube is then inserted under direct vision and the  obturator removed. Ventilator sleeve is inserted and the ventilator is  attached to the tracheostomy. Endotracheal tube is completely removed. The  cuff on the tracheostomy tube is inflated. The patient is easily ventilated  through the newly placed tracheostomy. Stay sutures are tied and left on the  anterior chest wall. Retractors are removed. Good hemostasis is noted. Skin  edges are reapproximated with interrupted 3-0 silk sutures. The tracheostomy  dressing is placed beneath the tracheostomy  device. A Velcro tracheostomy tie is then placed to secure the tracheostomy.  The patient is awakened from anesthesia and transported back to the  intensive care unit on the ventilator in good condition. The patient  tolerated the procedure well.  Velora Heckler, M.D.    TMG/MEDQ  D:  12/05/2002  T:  12/05/2002  Job:  [id number]   cc:   Charlaine Dalton. Sherene Sires, M.D. [facility]

## 2011-03-03 NOTE — Discharge Summary (Signed)
[name] [name], [name] [name]                        ACCOUNT NO.:  [id number]   MEDICAL RECORD NO.:  [id number]                   PATIENT TYPE:  INP   LOCATION:  0381                                 FACILITY:  [facility]   PHYSICIAN:  Charlaine Dalton. Sherene Sires, M.D. [facility]           DATE OF BIRTH:  [birth year]   DATE OF ADMISSION:  11/20/2002  DATE OF DISCHARGE:  01/01/2003                                 DISCHARGE SUMMARY   CONSULTATIONS:  1. Dr. Darryll Capers.  2. Dr. Darnell Level.   DISCHARGE DIAGNOSES:  1. Adult respiratory distress syndrome, status post independent respiratory     failure requiring tracheostomy with Pseudomonas infection, currently on     day #2 of 14 days of antimicrobial therapy with Cipro.  2. Hypertension, difficult control.  3. Chronic renal insufficiency, stable.  4. Anemia.  5. Degenerative joint disease.  6. Difficult to control diabetes mellitus.  7. Hypothyroidism.   HISTORY OF PRESENT ILLNESS:  The patient is a 75 year old African-American  male who initially presented to [facility] with a diffuse myalgias  of legs, chest, and back, with cough on deep inspiration and pain for three  days, was also positive for chills, dyspnea, and a cough.  He did not have  sputum production, but did have a temperature of 101 max.  He did have  yellow sputum production.  As he presented, he was in increasing respiratory  distress, and required intubation the following day on 11/21/02, and was  transferred from Dr. Darryll Capers service to pulmonary critical care service  due to the complexity of his care.   LABORATORY DATA:  On 12/28/02, sputum culture revealed Pseudomonas auriginous  which is sensitive to ciprofloxacin.  Sodium 130, potassium 4.9, chloride  98, CO2 28, glucose 100, BUN 32, creatinine 1.5, calcium 8.7.  LDH 163.  White blood cell count 7.4, hemoglobin 8.7, hematocrit 26.4, platelets 451.  Note his hemoglobin reached a  low of 7.8.  ESR was peak of 120, with a  low  of 116.  BUN reached a peak of 50.  AST was 18, ALT 17, ALP 80.  Total  bilirubin was 0.6.  TSH on last check on 12/17/02, was 19.72.  On 12/24/02, it  was 26.798.  Digoxin level was 0.7.  Sputum culture on 12/25/02, was positive  for methicillin-resistant Staphylococcus aureus which was treated with 10  days of antimicrobial therapy with vancomycin and Rifampin.  Sputum culture  positive for Pseudomonas on 12/28/02.  Currently on Cipro.   Radiographic data:  Chest x-ray shows no significant change in bilateral air  space disease.  A 2-D echocardiogram on 11/24/02, demonstrates left ventricle  is mildly dilated, overall left ventricular systolic function was moderately  decreased, ejection fraction 35 to 45% range, mild mitral regurgitation,  left atrium was mildly dilated, estimated peak arterial systolic pressure  was moderately increased.  A 12-lead EKG showed normal sinus rhythm with  first degree AV block.   HOSPITAL COURSE:  #1 -  BILATERAL COMMUNITY ACQUIRED PNEUMONIA WITH  SUBSEQUENT ACUTE RESPIRATORY FAILURE DEVELOPING ARDS REQUIRING MECHANICAL  VENTILATORY SUPPORT:  The patient was admitted to [facility] on  11/20/02, and by 11/21/02, required urgent orotracheal intubation, remained on  mechanical ventilatory support until 12/18/02.  He required a tracheostomy  insertion by Dr. Darnell Level on 12/05/02, which was down sized to a #4 on  12/19/02, which was removed on 12/22/02, and replaced on 12/25/02, for increasing  respiratory difficulty.  He continued to have a #4 metal trachea placed  which is fully corked, and is on O2 at 2 L nasal cannula along with  continued pulmonary support.  He did develop methicillin-resistant  Staphylococcus aureus and was treated with 10 days of Rifampin and  vancomycin.  He recently developed a Pseudomonas aeruginosa in his sputum,  and he is currently on day #2 of 14 of ciprofloxacin which Pseudomonas is  sensitive to.  He continued on these  interventions.  His hypoxic respiratory  failure has resolved.  His 2 L nasal cannula is generating 94 to 95% O2  saturations.  His main concern now is weakness.  He has trachea in place,  and will remain in place most likely for three more days, and then it can be  removed once he is stable in the new setting of a subacute care unit.  #2 -  ATRIAL FIBRILLATION WHICH CONVERTED TO A SINUS RHYTHM ON 11/27/02:  He  had a 2-D echocardiogram which showed an ejection fraction of 35 to 45%.  #3 -  RENAL INSUFFICIENCY, ACUTE RENAL FAILURE, AND OLIGURIA:  Responded to  fluid resuscitation.  BUN and creatinine are noted as in his records.  #4 -  SHOCK:  Shock resolved with fluid resuscitation and appropriate  antimicrobial therapy.  He did require a short course of vasopressors.  His  Cortisol level was checked on 11/21/02, and was 46.  #5 -  SEVERE SEPSIS:  Severe sepsis responded well to interventions and  treatment of shock.  #6 -  DIABETES MELLITUS:  The patient has difficult to control diabetes.  He  had been on Lantus insulin up to 50 b.i.d.  He is now on 25 mg b.i.d.  This  will be continually monitored in the subacute care unit.  #7 -  HYPERTENSION:  He has difficult to control hypertension.  He is on  multiple medications.  Please see medication record for current medications  that are controlling his hypertension.  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  Therefore, he is being transferred to the subacute care unit for further  evaluation and treatment.  #9 -  HYPOTHYROIDISM:  The patient has known hypothyroidism, and has been on  a high of 250 mcg a day of Synthroid with increasing TSH up to 26.  He was  on IV Synthroid for a period of time, has now been changed back to p.o.  This will be monitored in the subacute care unit.   DISCHARGE MEDICATIONS:  1. Minoxidil 10 mg b.i.d. 2.  Aranesp 100 mcg every seven days, this is the replacement for Epogen.  3. Clonidine 0.3 mg p.o. q.8h.  4. Digoxin 0.25 mg daily.  5. Ferrous sulfate 325 mg t.i.d.  6. Protonix 40 mg b.i.d.  7. Lopressor syrup 12.5 mg b.i.d.  8. Synthroid 200 mcg daily.  9. Lantus 25 units b.i.d. subcutaneously.  10.      Ciprofloxacin 500 mg q.12h., currently on day #2 of 14 days.  11.      Ensure one t.i.d.  12.      Tylenol 650 mg per rectum q.6-8h. p.r.n.  13.      Laxative of choice p.r.n.   DIET:  Mechanical soft diet, ADA diet.    DISPOSITION/CONDITION ON DISCHARGE:  His acute hypoxic respiratory failure  has resolved.  He is being transferred to the subacute care unit.        Brett Canales Minor, A.C.N.P. [facility]                 Charlaine Dalton. Sherene Sires, M.D. [facility]    SM/MEDQ  D:  01/01/2003  T:  01/01/2003  Job:  [id number]

## 2011-03-03 NOTE — Assessment & Plan Note (Signed)
[facility]                            CARDIOLOGY OFFICE NOTE   [name] [name], [name] [name]                     MRN:          [id number]  DATE:12/14/2006                            DOB:          07/11/1936    REFERRING PHYSICIAN:  Stacie Glaze, MD   HISTORY OF PRESENT ILLNESS:  [name] [name] is a 75 year old gentleman  with atherosclerotic coronary disease, chronic renal insufficiency, and  combined chronic systolic and diastolic heart failure.  He suffered at  non ST elevation myocardial infarction in 2005, which I treated with 3  overlapping drug-eluting stents to his right coronary artery.  He has  had no recurrent angina.  His EF was 41% at last assessment.   His renal function has stabilized over the past several months under the  care of Dr.'s Lovell Sheehan and [place].  He is not having any exertional  dyspnea.   CURRENT MEDICATIONS:  1. Actos 30 mg daily.  2. Enteric coated aspirin 325 mg daily.  3. Imdur 30 mg daily.  4. Lasix 80 mg daily.  5. Synthroid 150 mcg daily.  6. Norvasc 10 mg daily.  7. Calcitriol 0.25 mcg daily.  8. Glyburide 5 mg daily.  9. Plavix 75 mg daily.  10.Simvastatin 40 mg daily.  11.Clonidine 0.1 mg twice daily.   He says Dr. Briant Cedar recently prescribed new medications, which he has  not started.  The patient cannot recall these.  He says he has the  prescriptions at home.   PHYSICAL EXAMINATION:  He is generally well appearing.  In no distress.  Heart rate 61.  Blood pressure 153/84.  Weight of 224 pounds.  Weight is  up 5 pounds from November.  It is up 16 pounds from August.  He has a jugular venous pressure of 7 cm.  No thyromegaly or  lymphadenopathy.  His respiratory effort is normal.  Lungs are clear to auscultation.  He has a nondisplaced point of maximal cardiac impulse.  There is a  regular rate and rhythm without murmur, rub, or gallop.  ABDOMEN:  Moderately distended, but non-tender.  I am unable  to assess  for hepatosplenomegaly and pulsatile mass due to his distention.  There  is no abdominal bruit.  EXTREMITIES:  Warm without clubbing, cyanosis, edema, or ulceration.   IMPRESSION/RECOMMENDATION:  1. Combined systolic and diastolic heart failure:  Ejection fraction      41%.  Continue ACE inhibitor.  He cannot tolerate beta blocker at      even very low doses due to symptomatic bradycardia.  2. Renal insufficiency:  Being managed by Dr. Briant Cedar.  Baseline      appears to be approximately 3.  3. Hypercholesterolemia:  Three months ago switched to Zocor 40 mg      daily.  We will check CMET and lipid profile.  4. Diabetes mellitus:  Managed by Dr. Lovell Sheehan.  5. Hypertension:  Blood pressure elevated today.  Patient says it was      elevated when he saw Dr. Briant Cedar, who adjusted his medications      accordingly.  Since [name] has  not started these new      medications, I will make no adjustments today, but leave management      to Dr. Briant Cedar.  6. Hypothyroidism:  Managed by Dr. Lovell Sheehan.  7. Gout:  Managed by Dr. Lovell Sheehan.     [name], MD  Electronically Signed    WED/MedQ  DD: 12/14/2006  DT: 12/14/2006  Job #: [id number]   cc:   Stacie Glaze, MD  Dyke Maes, M.D.

## 2011-03-03 NOTE — Assessment & Plan Note (Signed)
[facility]                            CARDIOLOGY OFFICE NOTE   NAME:[name] [name], [name] [name]                     MRN:          [id number]  DATE:02/11/2007                            DOB:          1936-06-15    HISTORY OF PRESENT ILLNESS:  Mr. Zingale is a 75 year old gentleman  with arthrosclerotic coronary disease. He has chronic renal  insufficiency and chronic combined systolic and diastolic heart failure.  Ejection fraction is 41%. He had a non ST elevation myocardial  infarction in 2005 which I treated with 3 overlapping drug-eluting  stents in the right coronary artery. He has had no recurrent angina. He  was recently hospitalized with an episode of noncardiac chest discomfort  after carrying some tires. He has not had any recurrence. Stress test  showed no change from prior.   CURRENT MEDICATIONS:  1. Aspirin 325 mg daily.  2. Synthroid 150 mcg daily.  3. Plavix 75 mg daily.  4. Amlodipine 10 mg daily.  5. Lasix 80 mg daily.  6. Actos 30 mg daily.  7. Simvastatin 40 mg daily.  8. Imdur 30 mg daily.  9. Glyburide 5 mg daily.  10.Clonidine 0.2 mg twice daily.  11.__________ Mcg every other day alternating with 0.25 mcg every      other day.   PHYSICAL EXAMINATION:  He is generally well-appearing in no distress  with heart rate 65, blood pressure 136/66, and weight of 221 pounds.  He has no jugular venous distension, thyromegaly, or lymphadenopathy.  Respiratory effort is normal.  LUNGS: Clear to auscultation.  He has a non displaced point of maximal cardiac impulse. There is a  regular rate and rhythm without murmur, rub, or gallop.  ABDOMEN: Moderately distended but nontender. Unable assess for  hepatosplenometry or pulsatile mass due to his distension. There is no  abdominal bruits.  EXTREMITIES: Warm without clubbing, cyanosis, edema, or ulceration.  Carotid pulses are 2 + bilaterally without bruits.   Electrocardiogram demonstrates  normal sinus rhythm with first degree AV  block and poor R wave progression across the precordium. No change from  prior.   IMPRESSION/RECOMMENDATIONS:  1. Combined systolic and diastolic heart failure; ejection fraction      41%. INTOLERANT TO ACE INHIBITOR DUE TO MARKEDLY WORSENING RENAL      FUNCTION WHEN ON IT. This is managed by Dr. Briant Cedar.  2. CAN NOT TOLERATE BETA BLOCKADE EVEN AT VERY LOW DOSES DUE TO      SYMPTOMATIC BRADYCARDIA.  3. Renal insufficiency; baseline creatinine of approximately 3. Being      managed by Dr. Briant Cedar.  4. Hypercholesterolemia; LDL 69, HDL 51. Continue current management.  5. Diabetes mellitus; immunoglobulin A1C 7.9. managed by Dr. Lovell Sheehan.  6. Hypertension;  managed by Dr. Briant Cedar.  7. Hypothyroidism; managed by Dr. Lovell Sheehan.  8. Gout; managed by Dr. Lovell Sheehan.   Given my impending departure, I have asked Mr. Petroni to follow up  with Dr. Myrtis Ser in 6 months time.     [name], MD  Electronically Signed    WED/MedQ  DD: 02/11/2007  DT: 02/11/2007  Job #:  [id number]   cc:   Stacie Glaze, MD  Dyke Maes, M.D.

## 2011-03-03 NOTE — H&P (Signed)
NAME:  [name] [name], [name] [name] NO.:  [id number]   MEDICAL RECORD NO.:  [id number]          PATIENT TYPE:  INP   LOCATION:  1833                         FACILITY:  [facility]   PHYSICIAN:  Gordy Savers, M.D. LHCDATE OF BIRTH:  13-Jan-1936   DATE OF ADMISSION:  03/23/2005  DATE OF DISCHARGE:                                HISTORY & PHYSICAL   ADMISSION HISTORY AND PHYSICAL   CHIEF COMPLAINT:  Chest pain.   HISTORY OF PRESENT ILLNESS:  The patient is a 75 year old black gentleman,  with a history of prior coronary artery disease. He was stable until  yesterday, when he had the onset of some vague visual disturbances late in  the afternoon, followed by abdominal pain and change in his bowel habits. He  awoke this morning with some gastric discomfort, as well as some diarrhea.  He later developed left-sided chest pain with radiation to the shoulder and  neck areas. This was described as fairly sharp, almost pins and needles  sensation. He did take nitroglycerin, however, with resolution of his pain.  He also developed increasing shortness of breath and some wheezing, and he  states he had an episode of hemoptysis. Due to this feeling of weakness and  shortness of breath, he presented to the office for evaluation.  Electrocardiogram revealed some inferior ST-T wave changes and oxygen  saturations was only 88%. He is now admitted for further evaluation and  treatment of his chest pain and shortness of breath.   PAST MEDICAL/SURGICAL HISTORY:  The patient has a history of coronary artery  disease, underwent his initial heart catheterization in 1991 by Dr.  __________.  This revealed normal coronary arteries. He suffered a small,  non-ST segment elevation MI in late March 2005. He subsequently underwent  heart catheterization with Dr. Juanda Chance that revealed an occluded ramus  intermedius and mild disease in the remainder of the left coronary system.  The right coronary artery  had serial 90% stenoses, which were treated with  drug-eluting stents. On January 23, 2005, the patient had an adenosine Myoview  study that revealed no diagnostic ST changes during adenosine infusion. A  few PVCs were noted during the study. He did have a small fixed  inferolateral defect, predominantly at the apex, but no large reversible  defects to suggest any ischemia. Ejection fraction was calculated at 46%.  Other medical problems include hypertension, type 2 diabetes. He also states  that over the past several weeks, he had his initial episode of gout. He has  a history of hypothyroidism and hypercholesterolemia.   MEDICAL REGIMENS:  Include: 1. Metoprolol 25 mg b.i.d.  2. Clonidine 0.3 mg  t.i.d.  3. Lasix 80 mg daily.  4. Aspirin 325 mg daily.  5. Avandia 8 mg  daily.  6. Lipitor 80 mg daily.  7. Enalapril 5 mg daily.  8. Synthroid 0.15  mg daily.  9. Glyburide and metformin in combination 5/500, one b.i.d.   REVIEW OF SYSTEMS:  Review of systems exam is otherwise unremarkable. The  patient has had a remote history of a right CVA also in the  past.  Also, a  history of DJD. He was hospitalized in May 2005 for hemoptysis and hypoxia.  This was felt secondary to acute inhalation of a pesticide. In February  2004, he was hospitalized for ARDS, with ventilation-dependent respiratory  failure requiring tracheostomy.   PHYSICAL EXAMINATION:  VITALS:  Temperature 97.1, blood pressure 106/64,  weight 192.  GENERAL EXAM:  Revealed a well developed black male, in no acute distress at  rest. Oxygen saturations was 88%.  HEAD AND NECK EXAM:  Revealed normal fundi. Ear, nose, and throat clear. No  bruits were appreciated. He had prominent jugular venous pressure.  CHEST:  Revealed bibasilar rales in the lower one-third lung fields.  CARDIOVASCULAR:  Revealed a regular rhythm, without murmur.  ABDOMEN:  Unremarkable, except for some epigastric tenderness. Bowel sounds  were normal.   EXTREMITIES:  Revealed no edema.   IMPRESSION:  1.  Coronary artery disease with presentation of atypical chest pain.  2.  Dyspnea, hypoxemia, probable early congestive heart failure.   ADDITIONAL DIAGNOSES:  1.  Hypertension.  2.  Diabetes.   DISPOSITION:  The patient will be admitted to the telemetry setting.  Laboratory studies, including serial cardiac enzymes and a B-natriuretic  peptide will be reviewed. Serial EKGs will be monitored. The patient will be  seen in consultation by cardiology. He will be treated with diuresis and  placed on IV nitrates and IV heparin.     __    PFK/MEDQ  D:  03/23/2005  T:  03/23/2005  Job:  [id number]

## 2011-03-03 NOTE — Discharge Summary (Signed)
NAME:  [name] [name], [name] [name]                        ACCOUNT NO.:  [id number]   MEDICAL RECORD NO.:  [id number]                   PATIENT TYPE:  IPS   LOCATION:  4028                                 FACILITY:  [facility]   PHYSICIAN:  Ellwood Dense, M.D.                DATE OF BIRTH:  1936/03/04   DATE OF ADMISSION:  01/12/2003  DATE OF DISCHARGE:  01/23/2003                                 DISCHARGE SUMMARY   DISCHARGE DIAGNOSES:  1. Deconditioning.  2. Community acquired pneumonia.  3. Congestive heart failure.  4. Methicillin-resistant Staphylococcus aureus, resolved.  5. Diabetes mellitus.  6. Hypothyroidism.  7. Decreased nutritional storage.  8. Hypertension.  9. Mild renal insufficiency.  10.      Anemia.   HISTORY OF PRESENT ILLNESS:  This is a 75 year old African-American male  with history of noninsulin-dependent diabetes mellitus and hypertension who  was admitted to [facility] 11/20/02 with cough, diffuse weakness,  chills, and fever to 101.  Upon evaluation, sputum culture positive for  pseudomonas.  Placed on Cipro.  Decreased oxygen saturations required  intubation.  Medical coverage by Dr. Darryll Capers deferred to critical care  medicine.  Placed on mechanical ventilator support until 12/18/02.  Required  tracheostomy 12/05/02 per Dr. Gerrit Friends.  Attempts to decannulate with time  noted increased respiratory difficulty.  Tracheostomy tube remained in place  until 12/25/02, and later removed 01/08/03.   MRSA treated with a 10 day course of Rifampin and vancomycin.  Latest chest  x-ray 01/05/03 with bilateral patchy densities, unchanged from previous  study.  Placed on subcutaneous Lovenox for deep vein thrombosis prophylaxis.  Follow up TSH level for history of hypothyroidism noted to be 26.  He was on  Synthroid 200 mcg.  This was increased to 250 mcg.  Blood sugars variable.  Insulin had been added to his regimen.  He was admitted to the subacute care  unit 01/01/03  with progressive gains.  Later admitted to inpatient rehab  services for ongoing comprehensive therapy.   PAST MEDICAL HISTORY:  See discharge diagnoses.   ALLERGIES:  None.   SOCIAL HISTORY:  Remote smoker.  No alcohol.   MEDICATIONS PRIOR TO ADMISSION:  1. Atenolol.  2. Hyzaar.  3. Avandia.  4. Glucovance.  5. Norvasc.  6. Synthroid.   SOCIAL HISTORY:  Lives with wife in [place].  Independent prior to  admission.  He is a Teaching laboratory technician.  One level home.  Two steps to entry.  Wife and family assistance.   HOSPITAL COURSE:  The patient did well on rehabilitation services with  therapies initiated on a b.i.d. basis.  The following issues were followed  during the patient's rehabilitation course.  Pertaining to Mr. Kiper  deconditioning and community acquired pneumonia, remained afebrile.  Antibiotics have since been discontinued.  He remained on contact isolation  for MRSA.  He had completed a 10-day course of vancomycin and  rifampin.  Subcutaneous Lovenox was on board for deep vein thrombosis prophylaxis.  His  tracheostomy site had healed nicely.  He exhibited no signs of respiratory  distress, although he would desaturate on room air to 84% with his  therapies.  The patient would be granted rest periods, deep breathing.  Oxygen would improve to 92%.  He exhibited no signs of distress.  It would  felt he would not need oxygen therapy at his discharge.  His blood sugars  had some increased variables.  He was using Lantus insulin during his  hospital course.  His Avandia remained at 4 mg a day.  Glucovance was not  resumed due to some mild renal insufficiency with a BUN of 31.  His  Glucotrol was resumed at 5 mg twice daily.  His Lantus insulin had been  discontinued.  He would follow up with his primary M.D.  He had no bowel or  bladder disturbances.  It was noted routine follow up chest x-rays for his  community acquired pneumonia, last being 01/22/03, showed some  moderate  congestive heart failure, although chest x-ray x2 on 01/16/03 and 01/19/03  showed improvement.  Again, he exhibited no signs of respiratory distress or  fluid overload.  Lasix was added to his regimen at 20 mg daily.  His follow  up weight was 211.7 on 01/22/03.  Admission weight on 01/12/03 of 215.  He  would follow up with respiratory care, Dr. Sherene Sires, if needed, although Dr.  Darryll Capers could also address this.  Overall, for his function mobility,  he was ambulating extended distances with a rolling walker.  Endurance  greatly improved.  Family teaching completed.  Home health therapies would  be ongoing.   LABORATORY DATA:  The latest labs showed a sodium of 135, potassium 4.3, BUN  31, creatinine 1.5, hemoglobin 9.4, hematocrit 28.3.   DISCHARGE MEDICATIONS:  1. Clonidine 0.3 mg q.8h.  2. Lanoxin 0.125 mg daily.  3. Synthroid 250 mcg daily.  4. Minoxidil 10 mg twice daily.  5. Multivitamin daily.  6. Protonix 40 mg daily.  7. Lopressor suspension 12.5 mg twice daily.  8. Trinsicon twice daily.  9. Avandia 4 mg daily.  10.      Lasix 20 mg daily.  11.      Glucotrol 5 mg twice daily.   ACTIVITY:  As tolerated.   DIET:  Diabetic diet.   [facility] home care for physical, occupation therapy, and nursing.   FOLLOW UP:  He should follow up with Dr. Darryll Capers, [phone], concerning  his medical management.     Mariam Dollar, P.A.                     Ellwood Dense, M.D.    DA/MEDQ  D:  01/23/2003  T:  01/24/2003  Job:  [id number]   cc:   Charlaine Dalton. Sherene Sires, M.D. [facility]   Stacie Glaze, M.D. [facility]

## 2011-03-03 NOTE — Discharge Summary (Signed)
[name] [name], [name] [name]                        ACCOUNT NO.:  [id number]   MEDICAL RECORD NO.:  [id number]                   PATIENT TYPE:  INP   LOCATION:  0355                                 FACILITY:  [facility]   PHYSICIAN:  Rene Paci, M.D. [facility]          DATE OF BIRTH:  1936/05/14   DATE OF ADMISSION:  02/24/2003  DATE OF DISCHARGE:  02/27/2003                                 DISCHARGE SUMMARY   DISCHARGE DIAGNOSES:  1. Congestive heart failure exacerbation secondary to volume overload,     improved.  2. Right lower lobe infiltrate with effusion.  Question congestive heart     failure versus pneumonia, on empiric antibiotics.  3. Chest pain secondary to above, resolved.  Cardiac enzymes negative.  4. Acute renal insufficiency, improved.  Baseline creatinine 1.5.  5. Type 2 diabetes, reasonable control.  A1C 7.1.  6. Hypertension, moderately controlled.  7. Hypothyroidism.  TSH normal.  Medication controlled.  8. Transient elevated alkaline phosphatase.  Abdominal ultrasound normal     with gallbladder polyps and echogenicity.  Outpatient PIPIDA biliary scan     with symptomatic or persistent enzyme changes.  9. History of ventilator-dependent respiratory failure in 2/04.  10.      History of paroxysmal atrial fibrillation (PAF) while ventilator     dependent.  No recurrence.  11.      History of thyroidectomy.   DISCHARGE MEDICATIONS:  1. Avalox 400 mg one p.o. daily x3 additional days, to complete seven day     empiric course.  2. Lasix 80 mg p.o. b.i.d.  3. Metoprolol 75 mg p.o. b.i.d.  4. Aspirin 81 mg p.o. daily.  5. Synthroid 250 mcg p.o. daily.  6. Glucotrol 5 mg p.o. b.i.d.  7. Clonidine 0.3 mg p.o. t.i.d.  8. Protonix 40 mg p.o. daily.   Of note, the patient is to no longer continue taking his Avandia, Lanoxin,  or Minoxidil as prior to admission, as all of these medicines exacerbate his  congestive heart failure and/or are no longer needed.   The patient is  also to continue wearing his oxygen as prior to admission at  two liters per minute until further evaluation by his primary care physician  deems this is no longer needed.  The patient consistently sats at 96-97% on  two liters at rest and with ambulation.   HOSPITAL FOLLOW UP:  Follow up will be with his primary care physician, Dr.  Darryll Capers, scheduled for Friday, 03/20/03, at 11 a.m. to reevaluate his O2  saturations, fluid balance, and medication compliance.  The patient will  also be scheduled for an outpatient adenosine Cardiolite by [facility]  cardiology, day and time still pending scheduling at the time of dictation.   CONSULTS:  Dr. Olga Millers of [facility].    PROCEDURES:  1. A 2-D echo performed 02/25/03 showing LV function of 50-55%.  Mild LV     dilation.  No  wall motion abnormalities.  Mild RA, LA, and RV dilation.     Mild LVH.  2. Bilateral lower extremity Dopplers negative for DVT.   DISPOSITION:  The patient is discharged to home where he lives with his  wife.  He will resume advanced home care services with a home health R.N.,  PT, and OT as prior to admission.   CONDITION ON DISCHARGE:  Medically stable and improved.   BRIEF ADMISSION HISTORY OF PRESENT ILLNESS:  The patient is a pleasant 75-  year-old African-American male who was recently hospitalized 11/22/02 through  01/23/03.  The initial part of his hospitalization was for ventilator-  dependent respiratory failure with bilateral community acquired pneumonia  progressing to ARDS.  The patient had been trached, and his hospital course  was complicated by PAF, oliguric renal failure in the setting of shock.  He  was eventually decannulated and discharged to SICU on 01/12/03.  He  progressed well with therapy will in the SICU and was discharged home 01/23/03  with home health services.  However, in the last two weeks he had noticed  increased swelling in his lower extremities associated with mild chest  pain  coming in brief spurts, especially with exertion.  He admitted to not  wearing his oxygen consistently.  He had been seen in an outpatient setting  and prescribed a small dose of Lasix for lower extremity edema, but as this  did not seem to help the symptoms, the patient sought further evaluation in  the emergency room.  On evaluation in the emergency room, the patient was  felt to be profoundly volume overloaded with 3+ edema of the lower  extremities extending up into his abdomen with ascites and presacral edema.  He was admitted for rule out diuresis and further ischemic evaluation.   BRIEF HOSPITAL COURSE BY PROBLEM:  Problem 1.  Congestive heart failure  exacerbation.  As stated above, the patient is profoundly volume overloaded.  He was admitted to telemetry, and aggressive IV Lasix was pursued.  The  patient diuresed well with the loss of approximately five liters over 72  hours.  He was transitioned to oral medications which he tolerated well.  He  continues to diurese, and is likely approaching his near trial weight.  On  the day prior to discharge, his weight was 188 pounds.  The patient refused  being weighed on the day of discharge.  The patient was evaluated by  cardiology who agreed that the patient needed further ischemic risk  stratification.  The patient was not a catheterization candidate secondary  to his acute renal insufficiency and diabetes, both placing him at high risk  for contrast nephropathy.  A 2-D echo was performed showing preserved left  ventricular function, and no hydralazine or nitrites were pursued.  As an  outpatient, he will be continued on high-dose Lasix, beta blocker, and baby  aspirin.  No ACE inhibitor secondary to his renal insufficiency.  He will  also be discontinued on Avandia and Minoxidil as prior to admission, as both of these can exacerbate his congestive heart failure tendencies.  The  patient is to undergo adenosine Cardiolite as an  outpatient to be arranged  prior to discharge.  He is discharged today medically stable and anxious to  return home, as he has a wedding to participate in tomorrow a.m.  Problem 2.  Hypertension.  The patient's blood pressure was elevated at the  time of admission, possibly causing some of the exacerbation for  congestive  heart failure.  He was placed on a clonidine patch during this admission,  but at the time of discharge will be changed back to oral clonidine as prior  to admission.  His Lopressor dose was also increased during this admission  from 50 mg b.i.d. to 75 mg b.i.d.  His heart rate is stable in the 70's.  Ongoing titration of these medications will be per his primary care  physician.  Problem 3.  Acute renal insufficiency.  The patient's admission creatinine  was elevated at 2.6.  His discharge creatinine from 3/04 had been 1.5.  With  diuresis, the patient's creatinine returned to 1.9 on the day of discharge.  It is anticipated that this will continue to resolve.  Recommend repeat BMP  as an outpatient to monitor this.  The patient will not be on any ACE  inhibitors until this returns to its normal baseline.  Glucophage will also  be avoided for treatment of his diabetes secondary to renal insufficiency.  Problem 4.  Question pneumonia.  The patient had a right lower lobe  infiltrate with effusion, likely suggestive of  congestive heart failure during his state of volume overload.  The patient  refused repeat chest x-ray on the day of admission, but he will be continued  on empiric antibiotics, given his history of recent ventilator-dependent  respiratory failure.  The patient was afebrile, and his white count was  normal throughout this hospitalization.                                               Rene Paci, M.D. [facility]    VL/MEDQ  D:  02/27/2003  T:  02/27/2003  Job:  [id number]

## 2011-03-03 NOTE — Discharge Summary (Signed)
[name] [name], [name] [name]                        ACCOUNT NO.:  [id number]   MEDICAL RECORD NO.:  [id number]                   PATIENT TYPE:  INP   LOCATION:  0355                                 FACILITY:  [facility]   PHYSICIAN:  Rene Paci, M.D. [facility]          DATE OF BIRTH:  1936/07/29   DATE OF ADMISSION:  02/24/2003  DATE OF DISCHARGE:  03/03/2003                                 DISCHARGE SUMMARY   DISCHARGE DIAGNOSES:  1. Congestive heart failure exacerbation secondary to volume overload,     improved.  Adenosine Cardiolite Mar 01, 2003, negative for ischemia,     preserved left ventricular function.  2. Right lower lobe infiltrate with effusion, question congestive heart     failure versus pneumonia, on empiric antibiotics.  3. Chest pain secondary to above, resolved.  4. Acute-on-chronic renal insufficiency.  Baseline creatinine 1.5.  5. Type 2 diabetes, reasonable control.  Hemoglobin A1C 7.1.  6. Hypertension, improved control.  7. Hypothyroidism, medication controlled.  8. Transient elevated alkaline phosphatase, abnormal ultrasound of the     abdomen with gallbladder polyps and echogenicity.  Outpatient HIDA     biliary scan with enzymes changes.  9. Anemia, normocytic.  Discharge hemoglobin 8.  10.      History of ventilator-dependent respiratory failure with adult     respiratory distress syndrome and bilateral pneumonia in February 2004.  11.      History of paroxysmal atrial fibrillation while ventilator-     dependent.  No recurrence.  12.      History of thyroidectomy.   DISCHARGE MEDICATIONS:  1. Avelox 400 mg p.o. daily x3 additional days to complete total 10-day     empiric course.  2. Lasix 80 mg p.o. b.i.d.  3. Metoprolol 75 mg p.o. b.i.d.  4. Aspirin 81 mg p.o. daily.  5. Synthroid 250 mcg p.o. daily.  6. Glucotrol 5 mg p.o. b.i.d.  7. Clonidine 0.1 mg p.o. three times daily.  8. Protonix 40 mg p.o. daily.  9. Of note, the patient is to no longer  continue taking his Avandia,     Lanoxin, or Minoxidil as prior to admission.  All of these medications     are felt to either exacerbate his congestive heart failure and/or are no     longer needed.  10.      The patient is also to continue wearing his oxygen as prior to     admission at 2 L/min until further evaluation by his primary care     physician.  The patient consistently has saturations of 97-98% on 2 L/min     at rest and with ambulation, dropping to 88-89% on room air with     ambulation.   HOSPITAL FOLLOW-UP:  With his primary care physician, Dr. Darryll Capers, for  Friday, March 20, 2003, at 11 a.m. to reevaluate his O2 saturations, fluid  balance, and medication compliance.  Of note,  his discharge weight is 198  pounds.  The patient will also be seen by [facility], Dr. [name],  scheduled for April 21, 2003, at 3 p.m.  The patient will also need to be  arranged with outpatient GI for anemia workup.  I would have anticipated  that his anemia is secondary to chronic renal failure.   Please see previous discharge summary dated Feb 27, 2003, for a more  detailed report.   HOSPITAL COURSE:  #1 - CONGESTIVE HEART FAILURE EXACERBATION:  The patient's  initial discharge on Feb 27, 2003, was delayed for further IV diuresis as  per cardiology.  He was given a day pass to attend his youngest son's  wedding on the morning of Feb 28, 2003, but he returned to the hospital to  continue his IV Lasix.  The patient continued to lose fluid, with a negative  I&O balance and continued weight loss.  On the day prior to discharge he was  transitioned back to oral Lasix at a dose of 80 mg p.o. b.i.d.  His  discharge weight was 198 pounds, and he will continue on Lasix 80 mg b.i.d.  until otherwise instructed by his primary care physician or cardiologist.  Other medicines and the management of his CHF include metoprolol 75 b.i.d.  and aspirin 81 daily.  He was given no ACE inhibitor  secondary to his  chronic renal failure.  It is anticipated that cardiology may try a low-dose  ACE inhibitor but defer to their decision on a later outpatient evaluation  pending a repeat creatinine.   #2 - TYPE 2 DIABETES:  The patient has been discontinued on his prior to  admission Avandia as this could cause exacerbation of his CHF.  At this time  it is still unclear what triggered his CHF exacerbation.  The patient was  tried on Lantus at 10 units q.h.s. during his hospitalization with improved  control.  However, due to financial restraints at this time the patient  refuses to fill a prescription for Lantus.  This will be deferred to his  primary care physician to continue working on better management of his  diabetes.   #3 - CHRONIC RENAL FAILURE:  The patient's creatinine continued to fall  during the next several days of hospitalization.  His creatinine returned to  its baseline value, and discharge creatinine on the day of discharge was  1.6.   #4 - NORMOCYTIC ANEMIA:  His discharge hemoglobin is 8.  This remained  stable during his hospitalization, with no significant fall.  He was heme-  negative in his stool.  However, it is felt that he needs an outpatient GI  workup to rule out underlying malignancy.  Most likely his chronic renal  failure explains his normocytic anemia, and he may benefit from a trial of  EPO once his GI workup has been completed.                                               Rene Paci, M.D. [facility]    VL/MEDQ  D:  03/03/2003  T:  03/03/2003  Job:  [id number]

## 2011-03-03 NOTE — Consult Note (Signed)
[name] [name], [name] [name] NO.:  [id number]   MEDICAL RECORD NO.:  [id number]          PATIENT TYPE:  INP   LOCATION:  1413                         FACILITY:  [facility]   PHYSICIAN:  Michael L. Reynolds, M.D.DATE OF BIRTH:  1936-03-01   DATE OF CONSULTATION:  11/02/2005  DATE OF DISCHARGE:                                   CONSULTATION   REQUESTING PHYSICIAN:  Dr. Olga Millers   REASON FOR EVALUATION:  Suspected TIA.   HISTORY OF PRESENT ILLNESS:  This is the initial inpatient consultation  evaluation of this 75 year old male with past history which includes  hypertension, diabetes, coronary artery disease.  The patient was admitted  to [facility] on October 31, 2005, for chest pain.  While here,  has ruled out for MI.  The plan is to obtain an outpatient Cardiolite study.  The patient also had had some symptoms of a transient neurologic nature  which consultation is requested.  The patient says he was walking into [place], on Sunday, 2 days prior to admission when he noticed sudden onset of a  heavy sensation of the right let which was severe enough that he has had  difficulty walking.  He also said his right arm felt weak and did not work  well.  He stated that he sat down for about 15-20 minutes, and the symptoms  resolved, and he was actually able to walk back to his car and drive home.  He said he has never had any symptoms like that before.  He does stated that  he has been told he has had a stroke, affecting his right side in the past  and that this came about during hospitalization a couple of years ago in  which he was very sick with pneumonia.  In looking in the medical records, I  cannot find any primary documentation about any pervious strokes, although  he did have a lengthy admission with respiratory failure, requiring  ventilator support in February 2004.  In any case, he says this incident, he  has occasionally noted some weakness in his  right hand but nothing as  dramatic as what happened for 20 minutes on October 29, 2005.  He denies any  headache, alteration of consciousness, nausea, chest pain, shortness of  breath with palpitations with that episode.   PAST MEDICAL HISTORY:  1.  Hypertension.  2.  Diabetes as above, cared for by Dr. Lovell Sheehan.  3.  He has cardiac history of coronary artery disease with stenting in the      past and has history of chronic renal insufficiency with creatinine in      the 2.2 range.  4.  He has a history of hypothyroidism, status post thyroidectomy.   FAMILY HISTORY/SOCIAL/REVIEW OF SYSTEMS:  As outlined in the admission H&P  by Dr. [name] on October 31, 2005.   MEDICATIONS:  Prior to admission, he was baby aspirin. Avandia, Imdur,  Enalapril, Glyburide, Coreg, Synthroid, Lipitor, Lasix, and nitroglycerin  p.r.n.  He continues on the same medications in the hospital.   PHYSICAL EXAMINATION:  VITAL SIGNS:  Temperature 98.6, blood pressure  129/69, pulse 67, respirations 16, CBG 180.  GENERAL EXAMINATION:  This is a healthy-appearing man, seated, in no evident  distress.  HEAD:  Cranium normocephalic, atraumatic.  Oropharynx is benign.  He is  edentulous.  NECK:  Supple without carotid or supraclavicular bruits.  HEART:  Regular rate and rhythm without murmurs.  NEUROLOGIC:  Mental status:  He is awake and alert and fully oriented to  time, place, person.  Recent and remote memory are intact.  Attention span,  concentration, and fund of knowledge are all appropriate.  Speech is fluent  and not dysarthric.  He is able to name objects and repeat a phrase.  Cranial nerves and funduscopic exam is deferred.  Pupils are equal and  briskly reactive.  Extraocular movements full without nystagmus.  Visual  fields are full to confrontation.  Hearing is intact to finger rub.  Facial  sensation is intact to light touch.  Face, tongue, and palate move normally  and symmetrically.  Shoulder  shrug strength is normal.  Motor testing:  Normal bulk and tone.  Normal strength in left upper extremity muscles.  Sensation is intact to light touch to all extremities.  Coordination:  Rapid  movements are performed well.  Finger-to-nose is performed well.  He rises  from a chair easily and stands normally.  He is able to do a tandem walk  without difficulty.  Reflexes 2+ and symmetric.  Toes are downgoing  bilaterally.   LABORATORY REVIEW:  CBC from yesterday.  White count 3.8, hemoglobin 11.3,  platelets 1480,000.  CMET from yesterday remarkable for an elevated  potassium of 5.4, BUN and creatinine 42 and 2.2, respectively.  Low glucose  57, otherwise unremarkable.  Hemoglobin A1C, October 31, 2005, elevated at  7.4.  TSH, October 31, 2005, normal in the 5.3 range.  Lipid profile:  117.97 with cholesterol 136, HDL 46, LDL 68.  I personally reviewed the CT  of the head performed January 15 which shows what appears to be old  subcortical stroke in the right posterior frontal area but no obvious key  finding.   IMPRESSION:  Probable right brain transient ischemic attack on October 29, 2004.  He has several risk factors including hypertension, diabetes,  coronary artery disease.   RECOMMENDATIONS:  We will complete his work-up by checking an MRI/MRA, 2 D  echocardiogram, and homocystine level.  He also needs a transcranial Doppler  study which can be done in our office as outpatient unless the MRA raises  the question of severe intracranial occlusive disease, in which case it  should be done here.  He needs ongoing control of his risk factors including  hypertension and diabetes which are under suboptimal control.  Would suggest  changing his aspirin to Plavix 75 mg daily or possibly using the two in  combination.  Given he has had recent difficulties with coronary artery  disease, Plavix would be a better choice in this situation than Aggrenox. Thank you for the  consultation.      Michael L. Thad Ranger, M.D.  Electronically Signed     MLR/MEDQ  D:  11/02/2005  T:  11/02/2005  Job:  [id number]   cc:   Stacie Glaze, M.D. [facility]  [address] [place]  [place] [address]

## 2011-03-03 NOTE — Consult Note (Signed)
   NAME:  [name] [name], [name] [name]                        ACCOUNT NO.:  [id number]   MEDICAL RECORD NO.:  [id number]                   PATIENT TYPE:  INP   LOCATION:  0159                                 FACILITY:  [facility]   PHYSICIAN:  Currie Paris, M.D.           DATE OF BIRTH:  Feb 16, 1936   DATE OF CONSULTATION:  12/04/2002  DATE OF DISCHARGE:                                   CONSULTATION   REASON FOR CONSULTATION:  Tracheostomy.   HISTORY OF PRESENT ILLNESS:  I was asked to see this patient to consider him  for tracheostomy.  The patient is a 75 year old man who presented with  diffuse myalgias and respiratory symptoms, and rapidly developed a  septicemia picture.  He then had to be placed on a ventilator.  Went through  a zigorous protocol, and has been slowly making some improvements.  However,  he has been intubated for ventilator control now for approximately two  weeks, and is looking at approximately two more weeks of ventilator need.  Therefore, some consideration was requested for the possibility of  tracheostomy.   PAST MEDICAL HISTORY:  1. Diabetes.  2. Hypertension.  3. Neutropenia from his sepsis, and he has been hypothyroid.  He apparently     had a thyroidectomy.   PHYSICAL EXAMINATION:  GENERAL:  The patient is currently on a ventilator.  VITAL SIGNS:  His vital signs have been reasonably stable.  He is not  responsive due to sedation.  He is somewhat overweight.  NECK:  Thickened.  LUNGS:  Clear.  HEART:  Heart rate is regular.  ABDOMEN:  Soft.   LABORATORY DATA:  Hemoglobin 9.2, platelet count 265,000.   Chest x-ray has shown some slow improvement.   IMPRESSION:  Respiratory insufficiency with respirator dependency.   PLAN:  I think tracheostomy would be appropriate to be done in the operating  room.  I have gone over this with the patient's family and they are  agreeable to this.  This will be scheduled for tomorrow morning.  It should  be done by Dr.  Gerrit Friends.  They will check prothrombin time and PTT in the  morning.                                               Currie Paris, M.D.    CJS/MEDQ  D:  12/04/2002  T:  12/05/2002  Job:  [id number]

## 2011-03-03 NOTE — H&P (Signed)
NAMEMarland [name]  [name], [name] NO.:  [id number]   MEDICAL RECORD NO.:  [id number]          PATIENT TYPE:  EMS   LOCATION:  ED                           FACILITY:  [facility]   PHYSICIAN:  Gerrit Friends. Dietrich Pates, MD, FACCDATE OF BIRTH:  1936-09-28   DATE OF ADMISSION:  01/21/2007  DATE OF DISCHARGE:                              HISTORY & PHYSICAL   SUMMARY OF HISTORY:  [name] [name] is a 75 year old African American  male who called our office this morning with chest discomfort and he was  referred to the emergency room for further evaluation.   [name] [name] states that on Saturday around 11 a.m. while changing oil  he suddenly developed a left-sided anterior sharp chest pain radiating  to his shoulder and left arm.  He gave a 10 on a scale of 0/10.  He  stated that it was very bad for about 30 minutes and then it diminished  in intensity.  He has difficulty rating but says it was about a 1.  Throughout Saturday, Saturday evening and Sunday, the discomfort  persisted.  He described this as a soreness.  While in church yesterday  he states that the intensity increased to about a 5.  He went home and  he took a sublingual nitroglycerin and went to sleep.  However, when he  awoke his discomfort was still about a 1 and persisted throughout the  evening and night.  He denies any associated shortness of breath,  nausea, vomiting or diaphoresis.  After the intense episode on Saturday  he did not have any further radiation to his left shoulder or arm.  He  denies prior occurrences.  He feels that it is different from his heart  attack in 2005 but he cannot really describe how it is different.  He  denies any changes with deep breathing or with movement, recent  accidents or injuries.   PAST MEDICAL HISTORY:  Notable for no known drug allergies.  However,  with BETA BLOCKERS he does have symptomatic bradycardia.   MEDICATIONS PRIOR TO ADMISSION:  1. Enteric-coated aspirin 325  daily.  2. Tylenol Arthritis 650 p.r.n.  3. Simvastatin 40 q.h.s.  4. Nitroglycerin 0.4 p.r.n.  5. Clonidine 0.2 b.i.d.  6. Amlodipine 10 mg daily.  7. Plavix 75 mg daily.  8. Actos 30 mg daily.  9. Calcitriol 0.25 mg daily (he is supposed to be taking two tablets      on even days and one on odd days; however, the patient does not      understand this and is just taking one per day).  10.Synthroid 150 mcg daily.  11.Imdur 30 daily.  12.Glyburide 5 mg daily.  13.Lasix 80 mg daily.   His past medical history is notable for:  1. Non-Q myocardial infarction in 2005 at which time a catheterization      showed a 40% left main, total ramus, 50% OM.  He had a 40/90/90 mid      to distal RCA.  Left ventriculogram was not done given his renal      insufficiency.  He underwent one Taxus  stent and two bare-metal      stents to the RCA.  Last adenosine Myoview was on March 25, 2005,      showed an EF of 41%, systolic hypokinesis, no ischemia.  2. He also has a history of hypertension.  3. Type 2 diabetes x9 years.  Last hemoglobin A1c in January 6.0.  4. He also has a history of gout.  5. Hypothyroidism, unknown last TSH.  6. TIA January 2007.  7. 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  The last      echocardiogram was November 02, 2005, with an EF of 45% , mild LVH,      bilateral atrial enlargement.  He does not weigh daily.  8. Chronic renal insufficiency with a baseline creatinine of 2.6.  In      March 2005, BUN and creatinine of 26 and 1.6; in June 2006 it was      56 and 2.3.  9. Hyperlipidemia.  Last lipid check was in February 2008, showed a      total cholesterol 152, triglycerides 85, HDL 56, LDL 78.  10.Paroxysmal atrial fibrillation in February 2006 when the patient      was hospitalized with ARDS.   SURGICAL HISTORY:  Notable for thyroidectomy, bilateral cataracts.   SOCIAL HISTORY:  He resides in [place] with his wife.  He hangs out  and works at [employer] as a Curator.  However, he is not officially  working.  He has not smoked since the 1960s; prior to that he smoked  less than pack a day for 6 years.  He has not drank any alcohol in 6  years; prior to this he had heavy use.  Denies any drugs or herbal  medications.  He tries to maintain a low salt/fat diet and he does not  exercise.   FAMILY HISTORY:  His mother is deceased at the age of 35 from diabetes  complications.  His father is 6 with lung cancer.  He has eight sisters  and four brothers.   REVIEW OF SYSTEMS:  In addition to above is notable for some right-sided  sinus congestion, nosebleed 2 weeks ago, dentures.  Positive snoring;  however, the patient's wife denies obstructive sleep apnea.  Nocturia.  Arthralgias all over.   PHYSICAL EXAMINATION:  GENERAL:  Well-nourished, well-developed, obese  African American male in no apparent distress, accompanied by wife and  granddaughter.  VITAL SIGNS:  Temperature is 97.  Blood pressure was initially 179/85  and rose to 188/88.  Post IV nitroglycerin his blood pressure is now  164/80.  Pulse is 58 and regular, respirations 18, 97% saturations on  room air.  HEENT:  Unremarkable except for edentulous.  NECK:  Supple without thyromegaly, adenopathy, JVD or carotid bruits.  CHEST:  Symmetrical excursion.  Lungs sounds were slightly diminished  but clear to auscultation.  HEART:  PMI is not displaced.  Regular rate and rhythm.  I did not  appreciate an S3, S4, murmurs, rubs, clicks or gallops.  All pulses are  symmetrical and intact without abdominal or femoral bruits.  SKIN:  Integument is intact.  ABDOMEN:  Obese.  Bowel sounds present without organomegaly, masses or  tenderness.  EXTREMITIES:  Negative cyanosis, clubbing or edema.  MUSCULOSKELETAL:  Grossly unremarkable.  He does not have any  reproducible chest discomfort with palpation or upper extremity range of  motion exercises. NEUROLOGIC:  Grossly  unremarkable.   Chest x-ray and labs are pending at the  time of this dictation.  EKG  shows sinus bradycardia with a ventricular rate of 59, normal axis,  slightly prolonged PR interval at 232, nonspecific ST-T wave changes,  delayed R-wave; old EKGs are not available.   IMPRESSION:  1. Prolonged atypical chest discomfort without associated acute EKG      changes.  The last time he states his discomfort was a zero was      prior to onset on Saturday.  2. Hypertension.  3. History is listed per past medical history.   DISPOSITION:  I reviewed with Dr. Dietrich Pates.  We will have [facility]  transfer to [facility] for admission to rule out myocardial infarction.  At the time of this dictation his labs are pending.  If he rules out for  myocardial infarction we will consider an outpatient adenosine Myoview,  given his chronic renal insufficiency.  However, if he rules in for  myocardial infarction he will need an inpatient catheterization with  premedication for preserving kidney function and a renal consult.      Joellyn Rued, PA-C      Gerrit Friends. Dietrich Pates, MD, [facility]  Electronically Signed    EW/MEDQ  D:  01/21/2007  T:  01/21/2007  Job:  [id number]   cc:   Dr. Lovell Sheehan, primary care physician  [name], MD  Dyke Maes, M.D.  Michael L. Thad Ranger, M.D.

## 2011-03-03 NOTE — Assessment & Plan Note (Signed)
[facility]                              CARDIOLOGY OFFICE NOTE   MICAHEL, OMLOR                     MRN:          [id number]  DATE:09/04/2006                            DOB:          November 04, 1935    PRIMARY CARE PHYSICIAN:  Stacie Glaze, MD.   HISTORY OF PRESENT ILLNESS:  Mr. Staller is a 75 year old gentleman with  coronary artery disease, chronic renal insufficiency and combined systolic  and diastolic congestive heart failure. He is status post a non ST elevation  myocardial infarction in 2005 which I treated with 3 overlapping drug-  eluting stents to his right coronary artery. He has had no recurrent angina.  His ejection fraction was 41% at last assessment.   His exertional dyspnea has been improved with diuresis. He feels that he is  asymptomatic at present.   CURRENT MEDICATIONS:  1. Actos 30 mg per day.  2. Enteric coated aspirin 325 mg per day.  3. Plavix 75 mg per day.  4. Imdur 30 mg per day.  5. Lasix 80 mg once daily.  6. Synthroid 150 mcg once daily.  7. Lipitor 40 mg per day.  8. Norvasc 10 mg per day.  9. Calcitriol 0.25 mcg per day.  10.Glyburide 5 mg per day.  11.Enalapril 20 mg once per day.   PHYSICAL EXAMINATION:  GENERAL:  He is generally well-appearing in no  distress.  VITAL SIGNS:  His heart rate 50, blood pressure 118/62 and equal  bilaterally. Weight is 219 pounds which is up 11 pounds from August. He has  jugular venous pressure of 6 cm. There is no thyromegaly or lymphadenopathy.  LUNGS:  Clear to auscultation. Respiratory effort is normal.  CARDIAC:  He has a nondisplaced point of maximal cardiac impulse. There is a  regular rate and rhythm without murmur, S3, or rub.  ABDOMEN:  Soft, nondistended, nontender. There is no hepatosplenomegaly, no  pulsatile midline mass and no abdominal bruit.  EXTREMITIES:  Warm without clubbing, cyanosis, edema or ulceration.   IMPRESSION/RECOMMENDATIONS:  1.  Combined systolic and diastolic heart failure:  Ejection fraction 41%.      Now back on ACE inhibitor. Beta blocker is discontinued due to      symptomatic bradycardia even at low doses.  2. Renal insufficiency:  Being managed by Dr. Briant Cedar. Creatinine had      declined to a baseline of 2.9. Will check again today and forward      results to doctors Lovell Sheehan and [place].  3. Hypercholesterolemia:  The patient concerned with medication cost. Will      switch from Lipitor to simvastatin 40 mg per day for cost savings. Will      check lipids and LFTs in 3 months' time.  4. Diabetes mellitus:  Managed by Dr. Lovell Sheehan.  5. Hypertension:  Nicely controlled.  6. Hypothyroidism:  Managed by Dr. Lovell Sheehan.  7. Gout:  Managed by Dr. Lovell Sheehan.     [name], MD  Electronically Signed    WED/MedQ  DD: 09/04/2006  DT: 09/04/2006  Job #: [id number]   cc:  Stacie Glaze, MD  Dyke Maes, M.D.

## 2011-03-03 NOTE — H&P (Signed)
NAME:  [name] [name], [name] [name] NO.:  [id number]   MEDICAL RECORD NO.:  [id number]          PATIENT TYPE:  EMS   LOCATION:  ED                           FACILITY:  [facility]   PHYSICIAN:  Olga Millers, M.D. LHCDATE OF BIRTH:  1935-11-18   DATE OF ADMISSION:  10/31/2005  DATE OF DISCHARGE:                                HISTORY & PHYSICAL   SUMMARY OF HISTORY:  [name] [name] is a 75 year old African-American male,  who presents to [facility] Emergency Room complaining of chest discomfort.   SUMMARY OF HISTORY:  [name] [name] is a 75 year old African-American male  who presented to [facility] Emergency Room complaining of chest discomfort.   While a work this morning at approximately 7:45 a.m. while picking up a tire  off the floor to place on the car, he developed anterior chest sharp,  stabbing discomfort, radiating around to his left side and into his left  shoulder blade.  He thought he might have been slightly short of breath.  He  denied any nausea, vomiting, or diaphoresis.  He denied prior occurrences,  but when questioned about what he experienced with his myocardial infarction  in March 2005, he states that it was similar.  On further questioning, it  was difficult to tell if his discomfort was similar to his myocardial  infarction.   He stated that he rested, and the discomfort eased off from a 10 to about a  1; however, it shortly returned to a 10 without any provocation.  He  informed his boss, who drove him to [facility] Emergency Room for further  evaluation.  While in the emergency room, he arrived at the emergency room  approximately at noon, and we were seeing the patient at approximately 4:00,  and he states his discomfort is still a 1, primarily in the left axilla  region and his left shoulder blade.  He states that the discomfort has not  been a zero since it started at 7:45.  He does admit to some dietary  indiscretion in the last week in regard to  Hooter's wings and increased  caffeine.   PAST MEDICAL HISTORY:  No known drug allergies.   MEDICATIONS PRIOR TO ADMISSION:  1.  Avandia 8 mg daily.  2.  Imdur 30 daily.  3.  Aspirin 81 daily.  4.  Enalapril 10 t.i.d.  5.  Glyburide 5 daily.  6.  Coreg 12.5 mg twice daily.  7.  Synthroid 0.1 mg daily.  8.  Lipitor 40 mg q.h.s.  9.  Lasix 40 mg daily.  10. Nitroglycerin 0.4 p.r.n.   Medical history is notable for a non-Q-wave myocardial infarction in March  2005, for which he received 3 stents to the RCA.  Catheterization at that  time showed a 40% distal left main, 60% proximal circumflex, total ramus  90/90 mid to distal RCA.  LV gram was not done secondary to renal  insufficiency.  Last adenosine __________ was on March 25, 2005, which showed  an EF of 41%, no ischemia, septal hypokinesis.  Last echocardiogram, Feb 25, 2003, showed an EF of  50-55%, no wall motion abnormalities, mild LVH,  bilateral atrial enlargement, RVE, and mild MR.  He has a history of CHF  associated with chronic on top of acute renal insufficiency.  Renal  ultrasound in the past has shown mild renal artery stenosis.  BUN and  creatinine in March 2005 was 26 and 1.6, and in June 2006, 56 and 2.3.  He  also has a history of hypertension, hyperlipidemia, gout, hypothyroidism  status post thyroidectomy, normocytic anemia, PAF in February 2006, when he  when he was hospitalized with ARDS.  There was questionable CVA history in  2004; however, the patient does not recall this except that he was told by  his primary care physician.   SOCIAL HISTORY:  He resides in [place] with his wife.  He is employed  with [employer] as a Curator.  He has not smoked since the 1960s.  Prior  to that, he smoked less than a pack-per-day  for 6 years.  He denies any  alcohol in 6 years.  Prior to that, he had heavy alcohol intake.  He has not  had alcohol since the 1960s.  Clarification.  He denies any drugs, herbal   medications.  He states that he does try to follow a low salt diet.  He does  not exercise.   FAMILY HISTORY:  His mother died at the age of 73 with diabetes  complications.  His father is deceased at the age of 47 with lung cancer.  He has 8 sisters and 4 brothers.   REVIEW OF SYSTEMS:  In addition to above is notable for a possible weight  gain in the last 2-3 months; however, the patient did not follow his weight  specifically.  He wears reading glasses.  He is edentulous and occasionally  wears his dentures.  Chronic shortness of breath on rare occasions.  This  has not changed.  Nocturia, arthralgias in the lower back and right knee.  This past Sunday, he experienced less than 30 minutes of right upper and  lower extremities weakness and numbness while he was in [place].  He did  not have any other neurological deficits at that time.   PHYSICAL EXAMINATION:  GENERAL:  Well-nourished, well-developed African-  American male in no apparent distress.  VITAL SIGNS:  Temperature 97.1, blood pressure 156/68, pulse 60,  respirations 20, 98% saturations on room air.  HEENT:  Unremarkable except for edentulous.  NECK:  Supple without thyromegaly and no JVD or carotid bruits.  CHEST:  Symmetrical excursion.  LUNGS:  Clear to auscultation.  HEART:  Regular rate and rhythm, normal S1 and S2.  I did not appreciate an  S3 or S4.  Without murmurs, rubs, clicks, or gallops.  All pulses are  symmetrical and intact.  SKIN:  Intact without rashes or lesions.  ABDOMEN:  Obese, bowel sounds present, without organomegaly, masses, or  tenderness.  EXTREMITIES:  Negative cyanosis, clubbing, or edema.  MUSCULOSKELETAL:  Intact.  He had full range of motion of his upper  extremities.  However, he does have tenderness that is palpable to the left  shoulder blade, left axilla and is similar to his admission discomfort.  This is also worse with twisting to the left.  Chest x-ray shows cardiomegaly, mild  edema, and vascular congestion.  An EKG  shows sinus bradycardia with a PR interval of 216, early R wave.  Old EKGs  are not available for comparison.  H&H is 11.3 and  34.0, normal indices,  platelets 148, WBC is 8.8, sodium 140, potassium 5.4, BUN 42, creatinine  2.2, glucose 57, normal LFTs.  BNP was slightly elevated at 336.  ER marker  was negative x 1.   IMPRESSION:  1.  Prolonged atypical chest discomfort, probably musculoskeletal in      etiology with a negative initial EKG and ER marker.  2.  Right-sided weakness and numbness this preceding Sunday.  3.  Hypertension.  4.  Renal insufficiency.  Slightly improved from June 2006.  5.  Normocytic anemia/bradycardic with first-degree arteriovenous block.  6.  Increased BNP; however, he did not have any active evidence of      congestive heart failure.  History per past medical history.   PLAN:  Dr. [name] reviewed the patient's history, spoke with him during  exam, and the patient agrees with the above.  We will admit the patient for  observation and rule out myocardial infarction.  If his enzymes are  negative, consider outpatient Myoview for further follow up.  Given his  recent right-sided numbness and weakness, we will perform a noncontrast head  CT given his renal insufficiency and carotid Dopplers.  I have called Dr.  Vickey Huger for a neurological consultation for possible transient ischemic  attack.  We will continue him on his home medications, and further plan will  be based on the above findings.   I was just informed by the charge nurse in the emergency room that there is  a possibility that he may be transferred to [facility], as telemetry beds at  [facility] are not available at this time.  I will have the emergency room  notified, Dr. Vickey Huger, if this takes place prior to his transfer.      Joellyn Rued, P.A. [facility]    ______________________________  Olga Millers, M.D. [facility]    EW/MEDQ  D:  10/31/2005  T:   10/31/2005  Job:  [id number]   cc:   Dr. Deforest Hoyles, M.D. [facility]  [address]  [address]  [place]  [place] [address]

## 2011-03-03 NOTE — H&P (Signed)
   NAME:  [name] [name], [name] [name]                        ACCOUNT NO.:  [id number]   MEDICAL RECORD NO.:  [id number]                   PATIENT TYPE:  INP   LOCATION:                                       FACILITY:  [facility]   PHYSICIAN:  Casimiro Needle B. Sherene Sires, M.D. [facility]           DATE OF BIRTH:  09-10-1936   DATE OF ADMISSION:  01/01/2003  DATE OF DISCHARGE:  01/12/2003                                HISTORY & PHYSICAL   CHIEF COMPLAINT:  This is a subacute care unit admission for the patient.   HISTORY OF PRESENT ILLNESS:  The patient is a 75 year old African-American  male who presented originally on November 20, 2002, to [facility]  with diffuse myalgia of the legs, chest, and back.  Pain on coughing and  deep inspiration for approximately three days.  He was found to have adult  respiratory distress syndrome and required intubation and tracheostomy along  with Pseudomonas infection and treated with antimicrobial therapy.  He also  had hypertension that is difficult to control, chronic renal insufficiency  has remained stable, anemia, degenerative joint disease, difficult to  control diabetes mellitus, hypothyroidism, debility secondary to prolonged  hospitalization.  After being hospitalized in [facility], he was  transferred on January 01, 2003, to subacute care unit for further evaluation  and treatment.   ALLERGIES:  None.   MEDICATIONS:  As noted on his medication sheet.   PAST MEDICAL HISTORY:  1. Diabetes.  2. Hypertension.  3. Hypothyroidism.  4. Asthma.  5. Status post thyroidectomy.   SOCIAL HISTORY:  Married.  Employed as a Gaffer.  No tobacco for 15 years.  History of ETOH in the past.   REVIEW OF SYSTEMS:  Taken in detail and noted in the HPI.   PHYSICAL EXAMINATION:  GENERAL:  Weak appearing African-American male in no  acute distress.  HEENT:  Essentially unremarkable.  NECK:  Without JVD.  LUNGS:  Decreased breath sounds, diffuse rhonchi without  wheezes.  HEART:  Diffuse distant heart sounds.  EXTREMITIES:  Without edema.   LABORATORY DATA:  Noted on the chart.   IMPRESSION AND PLAN:  This is an African-American male who has undergone  hospitalization for aforementioned discharge diagnoses of ARDS, Pseudomonas  infection, hypertension, chronic renal insufficiency,  anemia, degenerative joint disease, asthma, diabetes mellitus,  hypothyroidism.  He is admitted in subacute care unit for further evaluation  and treatment and strengthening and will be followed by Casimiro Needle B. Wert,  M.D. [facility] while in the subacute care unit.     Brett Canales Minor, A.C.N.P. [facility]                 Charlaine Dalton. Sherene Sires, M.D. [facility]    SM/MEDQ  D:  04/21/2003  T:  04/21/2003  Job:  [id number]

## 2011-03-03 NOTE — H&P (Signed)
NAMEMarland Kitchen  BLANCHE, [name] [name]                        ACCOUNT NO.:  [id number]   MEDICAL RECORD NO.:  [id number]                   PATIENT TYPE:  INP   LOCATION:  2952                                 FACILITY:  [facility]   PHYSICIAN:  Gordy Savers, M.D. [place]      DATE OF BIRTH:  Aug 29, 1936   DATE OF ADMISSION:  01/03/2004  DATE OF DISCHARGE:                                HISTORY & PHYSICAL   CHIEF COMPLAINT:  Chest pain.   HISTORY OF PRESENT ILLNESS:  The patient is a 75 year old African-American  male with multiple medical problems who was stable until the day of  admission.  He was at church, walked up a number of stairs, and had the  onset of severe substernal chest pain.  This was described as a severe  tearing sensation associated with profuse diaphoresis, nausea, and shortness  of breath.  Total duration of the pain was 5-10 minutes.  Because of the  severe pain, he presented to the emergency department for evaluation.  He  was begun on a rule out myocardial infarction protocol, and IV heparin and  IV nitrates started.  An EKG was obtained that revealed a few PVC's, but no  acute changes.   His wife states that he had a Cardiolite stress test approximately 2 months  ago that was unremarkable.  He has an extensive past medical history, and  was hospitalized in the spring of last year for congestive heart failure.  Cardiolite stress test was negative at that time, and left ventricular  function was normal.  He states that he has occasional fleeting left  anterior chest wall discomfort that is non-exertional.  No prior episodes of  severe exertional chest pain is described.  He is admitted for further  evaluation and treatment.   PAST MEDICAL HISTORY:  He was hospitalized in the spring of last year for  congestive heart failure.  At that time, Cardiolite stress test was  negative, and left ventricular function was normal.  This was felt related  to volume overload and diastolic  dysfunction.  He has a history of renal  insufficiency with a baseline creatinine of 1.5.  Other medical problems  include type 2 diabetes, hypertension, and hypothyroidism.  He is status  post thyroidectomy in the past.  He has a history of anemia with hemoglobin  of 8 gm percent last spring.  In February of last year, he was hospitalized  for severe community acquired pneumonia and secondary ARDS, and he was  ventilator dependent for time, requiring a tracheostomy.  He has a history  of asthma and DJD.   PRESENT MEDICAL REGIMEN:  1. Synthroid 0.25 mg daily.  2. Glucovance 5/500 b.i.d.  3. Aspirin 325 daily.  4. Avandia 4 mg daily.  5. Metoprolol 50 mg b.i.d.  6. Clonidine 0.3 mg t.i.d.   SOCIAL HISTORY:  He is married and accompanied by his wife.  He has been  disabled due to his cardiac  condition.   FAMILY HISTORY:  Positive for coronary artery disease.   PHYSICAL EXAMINATION:  VITAL SIGNS:  Blood pressure 130/80, pulse rate 64.  SKIN:  Warm and dry without diaphoresis.  GENERAL:  A well-developed, mildly overweight black male in no acute  distress at rest.  Nasal oxygen in place.  HEAD AND NECK:  Normal pupillary responses.  Conjunctivae clear.  TM's  unremarkable.  Neck revealed no bruits or neck vein distention.  Tracheostomy scar noted.  CHEST:  Bibasilar rales.  CARDIOVASCULAR:  Normal S1 and S2.  No gallops appreciated.  ABDOMEN:  Obese, soft, and nontender.  No organomegaly.  GENITALIA:  External genitalia normal.  EXTREMITIES:  No significant edema.  Peripheral pulses were full.   IMPRESSION:  Chest pain syndrome.  Rule out coronary insufficiency.  History  of negative Cardiolite stress test.   ADDITIONAL DIAGNOSES:  1. Chronic renal insufficiency.  2. Diabetes.  3. Hypertension.   DISPOSITION:  The patient will be admitted to the hospital to a telemetry  setting.  Rule out myocardial infarction protocol will be instituted.  Cardiology will see for  evaluation.                                               Gordy Savers, M.D. [facility]    PFK/MEDQ  D:  01/03/2004  T:  01/04/2004  Job:  [id number]

## 2011-03-03 NOTE — Discharge Summary (Signed)
NAME:  [name] [name], NOVOSAD NO.:  [id number]   MEDICAL RECORD NO.:  [id number]          PATIENT TYPE:  INP   LOCATION:  3731                         FACILITY:  [facility]   PHYSICIAN:  [name], MD  DATE OF BIRTH:  1935/12/05   DATE OF ADMISSION:  01/21/2007  DATE OF DISCHARGE:  01/22/2007                               DISCHARGE SUMMARY   PRIMARY CARDIOLOGIST:  [name], MD.   PRIMARY CARE PHYSICIAN:  Dr. Lovell Sheehan.   PROCEDURES PERFORMED:  None performed during hospitalization.   DISCHARGE DIAGNOSES:  1. Atypical chest pain.  2. Known coronary artery disease.      a.     Non-Q wave myocardial infarction, 2005.      b.     Status post cardiac catheterization, 2005, showing 40% left       main, total ramus, 50% obtuse marginal.  He had a 40/90/90 mid to       distal right coronary artery.  Left ventriculogram was not done,       given his renal insufficiency.  He underwent one Taxus stent and       two bare metal stents to the right coronary artery.      c.     Last adenosine Myoview was on March 25, 2005, with an       ejection fraction of 41%, systolic hypokinesis, with no ischemia.   SECONDARY DIAGNOSES:  1. Hypertension.  2. Type 2 diabetes.  3. Gout.  4. Hypothyroid.  5. History of transient ischemic attack.  6. History of congestive heart failure secondary to systolic and      diastolic dysfunction with most recent echocardiogram showing an      ejection fraction of 45% in January of 2007.  7. Chronic renal insufficiency with a baseline creatinine of 2.6.  8. Hyperlipidemia.  9. Paroxysmal atrial fibrillation.   HISTORY OF PRESENT ILLNESS:  [name] is a 75 year old African  American male who called the office on the morning of January 21, 2007 with  complaints of chest discomfort and was referred to the emergency room.  The patient was seen and examined by Dr. Ripley Bing and Joellyn Rued, physician assistant, on arrival to the  emergency room.  The  patient had at that time complaints of prolonged atypical chest  discomfort without associated EKG changes.  The patient was admitted to  rule out cardiac etiology for chest discomfort and had serial enzymes  cycled.  The patient was started on anticoagulation at that time.   The patient was found to be hypertensive on admission with a blood  pressure of 179/85, rising to 188/88.  He was started on IV  nitroglycerin and his blood pressure did begin to normalize.  EKG  revealed sinus bradycardia without ST-T wave changes noted.  The patient  was placed on telemetry and monitored under observation for 24 hours.   The patient's troponins were cycled and found to be negative x three.  His TSH was normal at 4.9, BNP 126, with a hemoglobin A1c of 7.9.  Chemistries were found to be  normal and he was not anemic.   On the day of discharge, the patient was seen and examined by me and Dr.  Randa Evens, and was found to be stable for discharge, with the chest  discomfort being atypical for cardiac etiology.  The patient will be  discharged home with no change in his medications.  We will schedule a  stress Myoview within the next two days, with a follow-up appointment  with Dr. Samule Ohm thereafter.   LABORATORY INVESTIGATIONS:  On discharge, hemoglobin 16.0, hematocrit  46.2, white blood cells 4.6, platelets 129,000.  PT 14.3, INR 1.1, PTT  50.  D-dimer 0.30.  Sodium 139, potassium 4.0, chloride 106, bicarbonate  25, glucose 70, BUN 47, creatinine 2.34.  LFTs within normal limits.  Hemoglobin A1c 7.9.  Troponins were found to be negative x three.  Cholesterol 148, triglycerides 140, HDL 51, LDL 69, TSH 4.9.  EKG:  Sinus bradycardia, first-degree block, with evidence of possible  anterior infarct.  Telemetry continued to have sinus bradycardia with  first-degree block noted.   MEDICATIONS ON DISCHARGE:  Enteric coated aspirin 325 once per day;  Tylenol Arthritis 650  p.r.n.; simvastatin 40 mg at bedtime;  nitroglycerin 0.4 mg p.r.n.; clonidine 0.2 b.i.d.; amlodipine 10 mg  daily; Plavix 75 mg daily; Actos 30 mg daily; calcitriol 0.25 mg daily;  Synthroid 150 mcg daily; Imdur 30 mg daily; glyburide 5 mg daily; Lasix  80 mg daily.   ALLERGIES:  The patient is intolerant to beta blockers causing  symptomatic bradycardia.   FOLLOW-UP:  1. The patient will be scheduled for a stress Myoview completed this      week through the [facility] office with a follow-up appointment with      Dr. Samule Ohm thereafter.  2. The patient will continue his current medication regimen as      directed.   DICTATION NOTE:  Time spent with the patient to include physician time:  30 minutes.      Bettey Mare. Lyman Bishop, NP      [name], MD  Electronically Signed    KML/MEDQ  D:  01/22/2007  T:  01/23/2007  Job:  [id number]   cc:   Lovell Sheehan, M.D.

## 2011-03-03 NOTE — Assessment & Plan Note (Signed)
[facility]                              CARDIOLOGY OFFICE NOTE   NAME:[name] [name], [name] [name]                     MRN:          [id number]  DATE:05/25/2006                            DOB:          1935-12-14    HISTORY OF PRESENT ILLNESS:  [name] [name] is a 75 year old gentleman with  coronary artery disease, chronic renal insufficiency, and combined systolic  and diastolic congestive heart failure.  He is status post a non-ST  elevation myocardial infarction in 2005, which I treated with three  overlapping drug-eluting stents in the right coronary artery.  He has had no  recurrent angina.  His EF is 41%.   Over the past several months, he has been troubled by exertional dyspnea  which we attributed to combination systolic and diastolic heart failure.  He  has had worsened renal function with a creatinine peaking at 4.5.  With  discontinuation of ACE inhibitor, it has declined at 2.6 and stabilized  there.  We have also discontinued his beta blocker out of concern that his  bradycardia was contributing to poor renal perfusion.   Since these changes, he has been doing quite nicely.  He says he is able to  walk up a flight a stairs without dyspnea which is an improvement over a  month ago.  He is not having any orthopnea, PND or edema.   CURRENT MEDICATIONS:  1. Actos 30 mg per day.  2. Enteric-coated aspirin 325 mg per day.  3. Imdur 30 mg per day.  4. Lasix 80 mg per day.  5. Synthroid 150 micrograms per day.  6. Lipitor 40 mg per day.  7. Lovas 10 mg per day.  8. Calcitriol 0.25 micrograms per day.  9. Glyburide 5 mg per day.  10.Plavix 75 mg per day.   PHYSICAL EXAMINATION:  GENERAL:  He is generally well appearing and looks  more robust than he has in a number of months.  VITALS:  He has a heart rate of 52 and a blood pressure of 118/64.  Weight  is 208 pounds.  NECK:  His jugular venous pressure is 6 cm.  There is no thyromegaly.  LUNGS:   Clear to auscultation.  Respiratory effort is normal.  HEART:  He has a nondisplaced point of maximal cardiac impulse.  There is a  regular rate and rhythm without murmur, rub or S3.  ABDOMEN:  Soft, nondistended, nontender.  There is no hepatosplenomegaly.  No pulsatile or midline mass and no abdominal bruit.   IMPRESSION/RECOMMENDATIONS:  1. Combined systolic and diastolic heart failure, ejection fraction (EF)      41%.  We have stopped his ACE inhibitor and beta blocker out of      concerns that they were worsening his renal function.  In addition, the      beta blocker was clearly making him feel quite sluggish.  I will not      resume the beta blocker as in retrospect, he was having symptomatic      bradycardia.  I will leave to Dr. Briant [name] whether we should resume  his ACE inhibitor, though I would favor this.  2. Renal insufficiency.  It remains unclear whether [name] [name] has      renal artery stenosis which may be contributing to his renal      insufficiency.  Kidney sizes are 11.3 x 6 centimeters on the right and      10.5 x 5.1 centimeters on the left.  Renal duplex was nondiagnostic due      to poor windows.  I will discuss with Dr. Briant [name] whether we should      consider proceeding to CO2 angiography for definitive exclusion of      renal artery stenosis.  Dr. Briant [name] is away today.  I will touch base      with him next week.  3. Hypercholesterolemia, continue statin.  4. Diabetes mellitus, managed by Dr. Lovell [name].  5. Hypertension, nicely controlled.  No change in medicines.  6. Hypothyroidism, managed by Dr. Lovell [name].  7. Gout, managed by Dr. Lovell [name].                                 [name], MD    WED/MedQ  DD:  05/25/2006  DT:  05/25/2006  Job #:  [id number]   cc:   Dyke Maes, MD  Stacie Glaze, MD

## 2011-03-03 NOTE — Consult Note (Signed)
NAMEMarland Kitchen  [name] [name], [name] [name]                        ACCOUNT NO.:  [id number]   MEDICAL RECORD NO.:  [id number]                   PATIENT TYPE:  INP   LOCATION:  1478                                 FACILITY:  [facility]   PHYSICIAN:  [name], M.D.             DATE OF BIRTH:  1936/05/24   DATE OF CONSULTATION:  01/04/2004  DATE OF DISCHARGE:                                   CONSULTATION   PRIMARY CARE PHYSICIAN:  Dr. Lovell Sheehan.   The patient is not sure of who is his primary cardiologist is.   SUMMARY OF HISTORY:  [name] [name] is a 75 year old African American male  who is admitted by the primary MD service through [facility] emergency room  with chest discomfort. [name] [name] states while at church yesterday  morning at approximately 9:45 while climbing stairs he suddenly became very  short of breath and developed an anterior chest grabbing and tearing  sensation without radiation, associated with diaphoresis. He denies any  nausea or vomiting. He rates it as a 10 on a scale of 0 to 10.  He denies  prior occurrences. He was assisted and he was sat, and he describes things  as going dark. His wife states a friend was calling his name, but the  patient was not responding.  This lasted for several seconds. She states  that he did not actually lose consciousness, have any seizures, or  associated incontinence. The patient did not recall a friend calling his  name. He was assisted to his car and he was driven to the emergency room for  evaluation. The patient states that in the emergency room they checked his  sugar and it was 116; however, an actual report of his cannot be found. In  the emergency room he gave his chest discomfort as a 3 on as scale of 0 to  10. He states that they did a bunch of stuff, and he feels better, and he  has not had any further occurrences. He attributes yesterday's episode to  not eating correctly and taking too much of his diabetes medication. He  states that he only had a sandwich for breakfast and this is much less than  usual, and he took all of his medications. He denies any prior exertional  limitations, chest discomfort, shortness of breath, or syncopal episodes.   PAST MEDICAL HISTORY:  Notable for no known drug allergies, according to the  H&P.  His previous medications include p.r.n. Advil, Synthroid 0.25 mcg  daily, Glucovance 5/500 b.i.d., aspirin 325 mg daily, Avandia 4 daily,  metoprolol 50 mg b.i.d., and clonidine 0.3 t.i.d.  He has a history of  chronic renal insufficiency; his creatinine in May 2004 was 1.5.  He has had  non-insulin-dependent diabetes mellitus for the proceeding 7 years. Glucoses  at home run in the 90s to 110s. He denies any opathies.  He also has a  history of hypertension,  hypothyroidism, status post thyroidectomy, and  normocytic anemia. He was hospitalized twice last year, in February and in  May for pneumonia. The February admission was complicated with ARBs with  extended ventilation and tracheostomy, and paroxysmal atrial fibrillation.  The patient and his wife state that he may have had a stroke during that  admission and he does have some residual right-sided weakness. His May  admission was complicated by CHF; however chart is unavailable at the time  of this dictation. An echocardiogram on Feb 25, 2003, showed an EF of 50% to  55% without wall motion abnormalities, mild LVH, mild left atrial and right  ventricular enlargement with a mild increase in PAT. Adenosine Cardiolite  performed on Mar 01, 2003, showed inferolateral thinning, no ischemia, and  it was not gated due to arrhythmia.   SOCIAL HISTORY:  He resides in [place] with his wife. 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 He has three  sons, three daughters, and two grandchildren who are all alive and well. He  quit smoking in the 1950's, and prior to that half a pack per day for 20   years. He has had any alcohol since the 1950's. He denies any herbal  medications, drug use, diet compliance, and he occasionally exercises by  walking.   FAMILY HISTORY:  His mother died at the age of 70 secondary to heart break  due to her recent husband's death. She also had a history of diabetes, end-  stage renal disease on hemodialysis.  His father died at the age of 73  secondary to lung cancer. He has three brothers deceased; one at the age of  7 with an MI, one at the of 29 to diabetes complications, and one with  sudden death in his 40s. Two sisters are deceased; one from an MVA and the  other from leukemia. Six sisters are alive and well, all have diabetes.   REVIEW OF SYSTEMS:  In addition to above, he does have reading glasses,  dentures are at home. Occasional back and knee arthralgias.   PHYSICAL EXAMINATION:  GENERAL: A well-nourished, well-developed, pleasant  African American male in no apparent distress.  VITAL SIGNS: Temperature 98.8, pulse 63, respirations 18, blood pressure  128/65. Admission weight 178, and 95% sat on room air.  His wife is present  during the interview.  HEENT: Unremarkable, except for edentulous.  NECK: Supple without thyromegaly, adenopathy, JVD, carotid bruits, or  thyromegaly. Adenopathy is not present. HEART: Regular rate and rhythm.  Normal S1 and S2.  No murmurs, rubs, clicks, or gallops appreciated. All  distal pulses are symmetrical and intact.  LUNGS: Symmetrical excursion without rales, rhonchi, or wheezes.  SKIN:  Does not show any integument discrepancies.  ABDOMEN: Slightly rounded. Bowel sounds present without organomegaly,  masses, or tenderness.  EXTREMITIES: Negative clubbing, cyanosis, or edema. All peripheral pulses  are symmetrical and intact.  MUSCULOSKELETAL/NEUROLOGIC: Unremarkable.   LABORATORY DATA:  Chest x-ray on admission shows cardiomegaly with questionably mild basilar interstitial edema. EKG shows normal sinus  rhythm  with a normal axis. PR interval slightly prolonged at 212. He has  nonspecific ST-T wave changes, PVC, and insignificant inferior Q waves. H&H  12.7 and 38.2, platelet count 147,000, WBC 5.7.  Sodium 133, potassium 5.3,  BUN 67, creatinine 2.1, glucose 258, and normal LFTs.  CK-MBs were negative  times three. Troponins were 0.11, 0.19, and 0.04.  PTT 33, PT 12.5.  Urinalysis was  positive for protein and glucose.   His current medications include clonidine 0.3 t.i.d., Plavix 75 mg daily,  aspirin 325 mg daily, levothyroxine 0.5 mg daily, glyburide 5 mg b.i.d.,  Avandia 4 daily, Lopressor 50 mg b.i.d., sliding scale glucometer, and IV  heparin.   IMPRESSION:  1. Somewhat atypical chest discomfort. It is hard to tell if this is     ischemic versus hypoglycemic. CK-MBs and electrocardiograms are negative     for myocardial infarction. Troponins are, however, slightly abnormal, but     not totally indicative of myocardial infarction.  2. Chronic renal insufficiency.  3. Hyperglycemia with a history of diabetes.  4. The rest of history is as per past medical history previously described.   DISPOSITION:  Dr. Samule Ohm, after reviewing the patient's history, discussing  current situation, and examining the patient, he should be evaluated for  ischemia. With his chronic renal insufficiency, an exercise stress  Cardiolite will be performed. If this is negative will continue medical  treatment for presumed coronary artery disease. If there are any  abnormalities he should undergo catheterization with renal precautions.  Agree, with continuing aspirin and  Plavix and continue to hold Glucovance until above results obtained or add a  statin at high doses with his diabetes and presumed coronary artery disease.  Post decision of catheterization, an ACE inhibitor should be added with his  history of probable CHF, diabetes, and renal insufficiency.     Joellyn Rued, P.A. [facility]                     [name], M.D.    EW/MEDQ  D:  01/04/2004  T:  01/05/2004  Job:  [id number]

## 2011-03-03 NOTE — Discharge Summary (Signed)
NAME:  [name] [name], [name] [name]                        ACCOUNT NO.:  [id number]   MEDICAL RECORD NO.:  [id number]                   PATIENT TYPE:  INP   LOCATION:  0356                                 FACILITY:  [facility]   PHYSICIAN:  Sean A. Everardo All, M.D. [facility]           DATE OF BIRTH:  [birth year]   DATE OF ADMISSION:  03/12/2004  DATE OF DISCHARGE:                                 DISCHARGE SUMMARY   REASON FOR ADMISSION:  Hemoptysis and hypoxia.   HISTORY OF THE PRESENT ILLNESS:  The patient is a 75 year old man admitted  by Dr. Felicity Coyer on Mar 12, 2004 with hemoptysis and a decreased oxygen  saturation.  Please refer to her History and Physical for details.   HOSPITAL COURSE:  The patient was admitted and given empiric Avelox as well  as oxygen.  On the morning of Mar 13, 2004 he is ambulatory off oxygen.  If  his oxygen saturation is above 90% he will be discharged home.  If not, he  will stay in the hospital to continue his empiric Avelox.   During his hospital course he was found to have a hemoglobin of 11.2, a  creatinine of 2.0, and a TSH of 0.06.  All will need outpatient follow-up.   IMPRESSION:  1. Clinical syndrome of hypoxia and hemoptysis, possibly due to acute     inhalation of a pesticide.  2. Stable anemia.  3. Stable renal insufficiency.  4. Slightly over-supplemented with Synthroid.   DISCHARGE MEDICATIONS:  1. Synthroid 175 mcg daily.  2. Plavix 75 mg daily.  3. Aspirin 325 mg daily.  4. Lipitor 80 mg daily.  5. Lopressor 50 mg twice daily.  6. Avandia 4 mg daily.  7. Catapres 0.3 mg three times daily.  8. Avelox 400 mg daily.  9. Glucovance 5/500 one tablet twice daily - to restart on Mar 14, 2004 as     he had a CT during this hospitalization.   ACTIVITY:  No limitation on exercise.   DIET:  Weight loss is advised.   FOLLOW-UP:  Dr. Lovell Sheehan within 10 days.   Focus follow-up:  As his Synthroid is reduced he will need a follow-up TSH  next month.  His anemia  and renal insufficiency will need follow-up if that  is deemed appropriate.                                               Sean A. Everardo All, M.D. [facility]    SAE/MEDQ  D:  03/13/2004  T:  03/13/2004  Job:  [id number]   cc:   Stacie Glaze, M.D. [facility]

## 2011-03-03 NOTE — Consult Note (Signed)
NAME:  [name] [name], [name] [name]                        ACCOUNT NO.:  [id number]   MEDICAL RECORD NO.:  [id number]                   PATIENT TYPE:  INP   LOCATION:  0355                                 FACILITY:  [facility]   PHYSICIAN:  Olga Millers, M.D.                DATE OF BIRTH:  1936-08-25   DATE OF CONSULTATION:  DATE OF DISCHARGE:                                   CONSULTATION   REASON FOR CONSULTATION:  The patient is a 75 year old gentleman with a past  medical history of diabetes mellitus, hypertension, hypothyroidism, and  recent hospitalization for community-acquired pneumonia complicated by ARDS,  prolonged ventilator course requiring tracheostomy, and acute renal failure  whom we are asked to evaluate for congestive heart failure and chest pain.   HISTORY OF PRESENT ILLNESS:  The patient was recently admitted for a  community-acquired pneumonia and had a prolonged hospitalization as outlined  above.  He required tracheostomy and also had acute renal failure.  He was  discharged on January 23, 2003.  Since that time, he has been doing reasonably  well although he did complain of weakness which is slowly improving and some  dyspnea at times.  However, over the past two days he complains of  increasing lower extremity edema as well as abdominal swelling.  There is  also orthopnea and occasional PND.  Last evening, he had a brief episode of  chest pain that was substernal.  It was described as a burning sensation  without radiation.  It was not pleuritic or positional nor was it related to  food.  There was no associated shortness of breath, nausea, vomiting, or  diaphoresis.  It lasted for 2-3 minutes and resolved spontaneously.  He had  a second episode this morning.  He was therefore admitted for further  evaluation.   MEDICATIONS:  1. Clonidine 0.3 mg p.o. t.i.d.  2. Lanoxin 0.125 mg p.o. daily.  3. Synthroid 0.25 mg p.o. daily.  4. Trinsicon.  5. Lasix 20 mg p.o. daily.  6.  Minoxidil 10 mg p.o. daily.  7. Protonix.  8. Lopressor 50 mg p.o. b.i.d.  9. Avandia 4 mg p.o. daily.  10.      Glucotrol 5 mg p.o. b.i.d.   ALLERGIES:  He has no known drug allergies.   SOCIAL HISTORY:  He has a remote history of tobacco use but he does not  consume alcohol.   FAMILY HISTORY:  Negative for coronary artery disease.   PAST MEDICAL HISTORY:  Significant for diabetes mellitus, and hypertension.  He also had brief paroxysmal atrial fibrillation during his recent  hospitalization.  He did have a prolonged hospitalization for pneumonia as  described in the HPI.  He has a history of hypothyroidism and asthma.  He  has had a previous thyroidectomy.   REVIEW OF SYSTEMS:  He did have a headache this morning.  He denies any  fever or chills  and has no productive cough.  He denies any hemoptysis.  There is no dysphagia, odynophagia, melena, or hematochezia.  There is no  dysuria or hematuria.  There is no rash or seizure activity.  He has had  orthopnea and PND as well as worsening pedal edema.  There is also weakness  from his recent hospitalization.  Remaining systems are negative.   PHYSICAL EXAMINATION:  VITAL SIGNS:  Blood pressure 173/74 and his pulse is  78.  He is afebrile.  He is 89% on room air.  GENERAL:  He is well developed and well nourished.  He does not appear to be  in any acute distress.  He does not appear to be depressed.  There is no  peripheral clubbing.  SKIN:  Warm and dry.  HEENT:  Unremarkable with normal eyelids.  NECK:  Supple __________ bilaterally and there are no bruits noted.  His  neck veins are difficult to assess.  CHEST:  Shows diminished breath sounds at the bases bilaterally.  CARDIOVASCULAR:  Reveals a regular rate and rhythm.  I cannot appreciate  murmurs, rubs, or gallops.  ABDOMEN:  Significant for ascites.  I cannot appreciate hepatosplenomegaly.  There are no masses appreciated.  There is mild tenderness to palpation but  there  are no bruits noted.  He has 2+ femoral pulses bilaterally and no  bruits.  EXTREMITIES:  Show 3+ edema to the thighs bilaterally.  He also has  presacral edema.  NEUROLOGICAL:  Grossly intact.   LABORATORY AND ACCESSORY DATA:  His chest x-ray shows congestive heart  failure with effusion.  His electrocardiogram shows a normal sinus rhythm  with nonspecific ST changes.   His BUN and creatinine are 49 and 2.6 with a sodium of 138 and a potassium  of 4.5.  His white blood cell count is 4.6 with a hemoglobin of 8.8 and  hematocrit 27.1.  His albumin is normal at 3.4.  His initial enzymes are  negative.   DIAGNOSES:  1. Congestive heart failure/volume overload.  2. Recent echocardiogram revealing moderately reduced left ventricular     function.  3. Recent prolonged hospitalization for community-acquired pneumonia     complicated by adult respiratory distress syndrome, ventilator-dependent     respiratory failure, tracheostomy, and acute renal failure.  4. Acute on chronic renal insufficiency.  5. Hypertension.  6. Diabetes mellitus.  7. Hypothyroidism.  8. Anemia.   PLAN:  The patient presents with diffuse volume overload and chest pain.  We  will plan to repeat his echocardiogram to evaluate his systolic function.  It was mildly reduced by a previous echo but it was a poor study.  If it is  indeed decreased, then we will plan to add hydralazine nitrates for optimal  reduction (there will be no ACE inhibition secondary to his renal  insufficiency).  I agree with continuing diuresis and we will need to follow  his renal function closely.  We will continue with his beta-blockade and we  will add aspirin to it.  I do agree that the patient will require ischemia  evaluation.  I think his chest pain is atypical and his initial enzymes are  negative and we will continue to cycle those.  He does have multiple risk factors, however, and his previous echo showed reduced LV function.   However, given his recent acute renal failure and BUN and creatinine now  increased compared to his discharge values as well as diabetes mellitus, I  think he would be  at significant risk for contrast nephropathy.  I therefore  feel that he will most likely require a Cardiolite later in the week and  only proceed with catheterization if it shows significant abnormalities.  Given his recent hospitalization, I would also like to repeat his lower  extremity Dopplers and check a D-dimer although I think a pulmonary embolus  is unlikely.  I will make further recommendations once we have the above  information.                                              Olga Millers, M.D.   BC/MEDQ  D:  02/24/2003  T:  02/24/2003  Job:  [id number]

## 2011-03-03 NOTE — Discharge Summary (Signed)
NAME:  [name] [name], [name] [name] NO.:  [id number]   MEDICAL RECORD NO.:  [id number]          PATIENT TYPE:  INP   LOCATION:  1413                         FACILITY:  [facility]   PHYSICIAN:  Olga Millers, M.D. LHCDATE OF BIRTH:  1936-04-22   DATE OF ADMISSION:  10/31/2005  DATE OF DISCHARGE:  11/02/2005                           DISCHARGE SUMMARY - REFERRING   BRIEF HISTORY:  [name] [name] is a 75 year old African-American male,  who presented to [facility] Emergency Room complaining of chest discomfort.  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  He was not sure if he was  slightly short of breath.  He denied any nausea, vomiting, or diaphoresis.  He denied prior occurrences.  But after questioning his symptoms with his  myocardial infarction in March 2005, he stated that it may have been similar  or may not have been, he could not recall.  His discomfort seems to wax and  wane throughout the day, and it became worse early in the afternoon, thus  his presentation.   Medical history is notable for a non-Q-wave myocardial infarction in March  2005.  He received 3 stents to the RCA.  Last last adenosine Myoview was on  March 25, 2005 which showed an EF of 41%, no ischemia, septal hypokinesis.  Last echocardiogram May 2004 showed an EF of 50-55% without wall motion  abnormalities, mild LVH,  bilateral atrial enlargement, RVE, and mild MR.  He has a history of CHF associated with chronic and acute renal  insufficiency; last renal ultrasound showed mild renal artery stenosis.  BUN  and creatinine in June 2006, were 56 and 2.3, respectively.  He also has a  history of hypertension, hyperlipidemia, gout, hypothyroidism, status post  thyroidectomy, normocytic anemia,  PAF in February 2006 during a  hospitalization with questionable CVA history in 2004 and remote tobacco  use.   LABORATORY DATA:  Admission H&H was 11.3, and 34.0, normal indices,  platelets 148, WBCs 3.8, PTT 40, PT 14.3, sodium 140, potassium 4.6, and BUN  42, creatinine 2.2, glucose 57, normal LFTs.  Hemoglobin A1c was elevated at  7.4.  CK-MBs and troponins were negative for myocardial infarctions.  Lipids  on the 17th showed total cholesterol 136, triglycerides 111, HDL 46, LDL 68.  TSH was within normal limits at 5.283.  Stools were heme-negative during his  hospitalization.  Chest x-ray on admission showed cardiomegaly with vascular  congestion, interstitial pulmonary edema.  Head CT performed on the 17th  without contrast showed focal low attenuation in the subcortical white  matter of the posterior right frontal lobe, may be due to small vessel  ischemic disease.  Recommended MR for further evaluation.  Foreign body  orbital view did not show any radiopaque foreign body.  EKG showed sinus  bradycardia, first-degree block, normal sinus rhythm without acute changes,  delayed R wave  HOSPITAL COURSE:  [name] [name] was admitted to [facility] with plans for  a noncontrast CT and carotid Dopplers.  These were performed.  Carotid  Dopplers did not show any ICA stenoses.  Vertebral flow was antegrade on the  left.  Right vertebral flow was not able to be visualized.  Neurological  consult was obtained secondary to the patient having 13-minute episode of  right-sided weakness.  Neurology was called on the evening of admission;  however, they did not see the patient until November 02, 2004.  They reviewed  the CT scan and ordered an MRI for further evaluation.  They also ordered an  echocardiogram and a homocystine level which all pending at the time of this  dictation.  Dr. Dietrich Pates reviewed the patient's history, spoke with the  patient on the 18th, and felt that he could be  discharged home once the  neurological tests were completed.   DISCHARGE DIAGNOSES:  1.  History as noted below.  2.  Prolonged atypical chest discomfort, probable musculoskeletal in      etiology.  3.  Thirty minutes of right-sided weakness of uncertain etiology.  4.  Hyperglycemia with an elevated hemoglobin A1C.  5.  Chronic renal insufficiency.  6.  Normocytic anemia.  7.  Sinus bradycardia with first-degree arteriovenous block.   DISPOSITION:  [name] [name] is discharged home.  He received new  prescription for Plavix 75 mg q.d. per neurology recommendations.  He was  asked to continue his home medications which include Avandia 18 mg daily,  Imdur 30 mg daily, and aspirin 81 mg daily, enalapril 10 mg t.i.d. and  glyburide 5 mg q.d.  Coreg 12.5 b.i.d.,  Synthroid 0.15 mg daily, Lipitor 40  q.h.s., Lasix 40 mg daily, and nitroglycerin 0.4 as needed.  He will follow  up with Dr. Samule Ohm on December 06, 2005 at 12:15.  Prior to that, an  outpatient stress Myoview will be performed for cardiac evaluation.  It is  noted at the time of this dictation, echocardiogram, homocystine, MRA  reports are still pending.      Joellyn Rued, P.A. [facility]    ______________________________  Olga Millers, M.D. [facility]    EW/MEDQ  D:  11/02/2005  T:  11/02/2005  Job:  [id number]   cc:   Dr Lovell Sheehan   [name], M.D. [facility]  [address]  [address]  [place]  [place] [address]   [facility]

## 2011-03-14 ENCOUNTER — Encounter: Payer: Self-pay | Admitting: Internal Medicine

## 2011-03-14 ENCOUNTER — Ambulatory Visit (INDEPENDENT_AMBULATORY_CARE_PROVIDER_SITE_OTHER): Payer: Medicare Other | Admitting: Internal Medicine

## 2011-03-14 VITALS — BP 144/80 | HR 76 | Temp 98.6°F | Resp 16 | Ht 69.0 in | Wt 192.0 lb

## 2011-03-14 DIAGNOSIS — M109 Gout, unspecified: Secondary | ICD-10-CM

## 2011-03-14 DIAGNOSIS — I10 Essential (primary) hypertension: Secondary | ICD-10-CM

## 2011-03-14 DIAGNOSIS — E039 Hypothyroidism, unspecified: Secondary | ICD-10-CM

## 2011-03-14 DIAGNOSIS — E119 Type 2 diabetes mellitus without complications: Secondary | ICD-10-CM

## 2011-03-14 DIAGNOSIS — I2510 Atherosclerotic heart disease of native coronary artery without angina pectoris: Secondary | ICD-10-CM

## 2011-03-14 LAB — BASIC METABOLIC PANEL
CO2: 27 mEq/L (ref 19–32)
Calcium: 9.3 mg/dL (ref 8.4–10.5)
Chloride: 106 mEq/L (ref 96–112)
Potassium: 4.9 mEq/L (ref 3.5–5.1)
Sodium: 142 mEq/L (ref 135–145)

## 2011-03-14 LAB — URIC ACID: Uric Acid, Serum: 10.4 mg/dL — ABNORMAL HIGH (ref 4.0–7.8)

## 2011-03-14 LAB — TSH: TSH: 0.54 u[IU]/mL (ref 0.35–5.50)

## 2011-03-14 MED ORDER — HYDROCODONE-ACETAMINOPHEN 5-500 MG PO TABS: 1.0000 | ORAL_TABLET | Freq: Four times a day (QID) | ORAL | Status: DC | PRN

## 2011-03-14 MED ORDER — LEVOTHYROXINE SODIUM 200 MCG PO TABS: 200.0000 ug | ORAL_TABLET | Freq: Every day | ORAL | Status: DC

## 2011-03-14 NOTE — Assessment & Plan Note (Signed)
Patient has stress test ordered by Dr. [name] will review the results essentially normal with mild left ventricular dysfunction

## 2011-03-14 NOTE — Progress Notes (Signed)
Subjective:    Patient ID: [name] [name], male    DOB: 09-21-1936, 75 y.o.   MRN: [id number]  HPI Patient has a history of diabetes hypertension hyperlipidemia and hypothyroidism.  He also has gout.  He states that his blood sugar testing has been between the mid 90s to approximately 120 but the readings have been stable.  He denies any chest pain shortness of breath PND or orthopnea he does complain of intermittent gouty arthropathy especially when he eats pinto beans.  He had a stress test with Dr. [name] we will review the results with him he has a followup with renal doctors for his renal failure    Review of Systems  Constitutional: Negative for fever and fatigue.  HENT: Negative for hearing loss, congestion, neck pain and postnasal drip.   Eyes: Negative for discharge, redness and visual disturbance.  Respiratory: Negative for cough, shortness of breath and wheezing.   Cardiovascular: Negative for leg swelling.  Gastrointestinal: Negative for abdominal pain, constipation and abdominal distention.  Genitourinary: Negative for urgency and frequency.  Musculoskeletal: Negative for joint swelling and arthralgias.  Skin: Negative for color change and rash.  Neurological: Negative for weakness and light-headedness.  Hematological: Negative for adenopathy.  Psychiatric/Behavioral: Negative for behavioral problems.   Past Medical History  Diagnosis Date  . CAD (coronary artery disease)   . HTN (hypertension)   . Hyperlipidemia   . Cerebrovascular accident   . CHF (congestive heart failure)   . Diabetes mellitus   . CKD (chronic kidney disease)   . Unspecified hypothyroidism   . Gout    Past Surgical History  Procedure Date  . Thyroidectomy     reports that he quit smoking about 57 years ago. He does not have any smokeless tobacco history on file. He reports that he does not drink alcohol or use illicit drugs. family history includes Cancer in his father; Diabetes in  his mother; and Kidney failure in his mother. No Known Allergies     Objective:   Physical Exam  Constitutional: He appears well-developed and well-nourished.       Elderly AA male in NAD  HENT:  Head: Normocephalic and atraumatic.  Eyes: Conjunctivae are normal. Pupils are equal, round, and reactive to light.  Neck: Normal range of motion. Neck supple.  Cardiovascular: Normal rate and regular rhythm.   Pulmonary/Chest: Effort normal and breath sounds normal. He has no wheezes. He has no rales.  Abdominal: Soft. Bowel sounds are normal.  Musculoskeletal: He exhibits no edema.  Skin: Skin is warm and dry.          Assessment & Plan:  Patient has no symptoms of congestive heart failure or active coronary disease.  His blood pressures moderately well-controlled we'll review this cholesterol and thyroid labs as well as a hemoglobin A1c He has stage 3 renal failure. We reviewed the results below for the Cardiolite  Impression  Exercise Capacity: Lexiscan with no exercise.  BP Response: Normal blood pressure response.  Clinical Symptoms: No chest pain.  ECG Impression: No significant ST segment change suggestive of ischemia.  Comparison with Prior Nuclear Study: No significant change from previous study  Overall Impression: Low risk stress nuclear study. Old small inferior wall scar. No reversible ischemia. Mild LV dysfunction unchanged from previous study of 09/29/09.   The patient has lost weight and hopefully this will translate into a better hemoglobin A1c is a followup with cardiology in June for a lipid and liver therefore will not draw  one at this visit .

## 2011-03-26 ENCOUNTER — Other Ambulatory Visit: Payer: Self-pay | Admitting: Internal Medicine

## 2011-04-13 ENCOUNTER — Encounter: Payer: Self-pay | Admitting: Cardiology

## 2011-04-13 ENCOUNTER — Ambulatory Visit (INDEPENDENT_AMBULATORY_CARE_PROVIDER_SITE_OTHER): Payer: Medicare Other | Admitting: Cardiology

## 2011-04-13 DIAGNOSIS — I2589 Other forms of chronic ischemic heart disease: Secondary | ICD-10-CM

## 2011-04-13 DIAGNOSIS — I255 Ischemic cardiomyopathy: Secondary | ICD-10-CM | POA: Insufficient documentation

## 2011-04-13 DIAGNOSIS — E7800 Pure hypercholesterolemia, unspecified: Secondary | ICD-10-CM

## 2011-04-13 DIAGNOSIS — I10 Essential (primary) hypertension: Secondary | ICD-10-CM

## 2011-04-13 DIAGNOSIS — I2510 Atherosclerotic heart disease of native coronary artery without angina pectoris: Secondary | ICD-10-CM

## 2011-04-13 MED ORDER — ISOSORBIDE MONONITRATE ER 60 MG PO TB24: 60.0000 mg | ORAL_TABLET | Freq: Every day | ORAL | Status: DC

## 2011-04-13 MED ORDER — HYDRALAZINE HCL 50 MG PO TABS: 50.0000 mg | ORAL_TABLET | Freq: Three times a day (TID) | ORAL | Status: DC

## 2011-04-13 NOTE — Assessment & Plan Note (Signed)
Continue beta blocker and increase hydralazine to 50 mg t.i.d. Add Imdur 60 mg daily. No ACE inhibitor secondary to renal insufficiency.

## 2011-04-13 NOTE — Assessment & Plan Note (Signed)
Continue statin. 

## 2011-04-13 NOTE — Assessment & Plan Note (Signed)
Blood pressure elevated. Increase hydralazine to 50 mg p.o. T.i.d.

## 2011-04-13 NOTE — Assessment & Plan Note (Signed)
Continue aspirin, beta blocker and statin. 

## 2011-04-13 NOTE — Patient Instructions (Signed)
Your physician wants you to follow-up in: 6 MONTHS WITH DR [name] You will receive a reminder letter in the mail two months in advance. If you don't receive a letter, please call our office to schedule the follow-up appointment.   INCREASE HYDRALAZINE TO 50MG  ONE TABLET THREE TIMES DAILY  RESTART ISOSORBIDE 60MG  ONCE DAILY

## 2011-04-13 NOTE — Assessment & Plan Note (Signed)
Followed by nephrology. 

## 2011-04-13 NOTE — Progress Notes (Signed)
HPI: Mr. Highbaugh is a pleasant gentleman with history of coronary artery disease. His most recent catheterization was in March 2005 and showed a 40% left main, no significant disease in the circumflex, total occlusion of the ramus branch with a 50% first marginal, 40% proximal, followed by 95% mid, and 90% mid distal stenosis in the right coronary artery. The patient had successful PCI of the right coronary artery both the mid and mid-to- distal right lesions. Admitted in February of 2012 with dizziness which was felt secondary to a combination of urinary tract infection, narcotics and muscle relaxants. Also with acute on chronic renal insufficiency. There was felt to be a component of dehydration. Echocardiogram in March of 2012 showed an ejection fraction of 35%, mild mitral regurgitation, mildly dilated aortic root, mild right atrial and right ventricular enlargement. VQ scan was low probability. Because of his reduced LV function on echocardiogram we scheduled a Myoview which was performed in April of 2012. The ejection fraction was 44% and there was prior inferior infarct and no ischemia. Since he was last seen in March of 2012, the patient denies any dyspnea on exertion, orthopnea, PND, pedal edema, palpitations, syncope or chest pain.    Current Outpatient Prescriptions  Medication Sig Dispense Refill  . amLODipine (NORVASC) 5 MG tablet Take 10 mg by mouth daily.        Marland Kitchen amLODipine (NORVASC) 5 MG tablet Take 5 mg by mouth daily.        Marland Kitchen aspirin 325 MG tablet Take 325 mg by mouth daily.        . calcitRIOL (ROCALTROL) 0.25 MCG capsule 0.25 mcg. Take one cap by mouth on odd days and 2 caps on even days.       . carvedilol (COREG) 3.125 MG tablet Take 1 tablet (3.125 mg total) by mouth 2 (two) times daily with a meal.  60 tablet  11  . furosemide (LASIX) 80 MG tablet 1 tab in the morning and 1/2 tab at night      . glyBURIDE (DIABETA) 5 MG tablet Take 5 mg by mouth 2 (two) times daily.       .  hydrALAZINE (APRESOLINE) 50 MG tablet Take 1 tablet (50 mg total) by mouth 2 (two) times daily.  60 tablet  6  . HYDROcodone-acetaminophen (VICODIN) 5-500 MG per tablet Take 1 tablet by mouth every 6 (six) hours as needed.  30 tablet  3  . nitroGLYCERIN (NITROSTAT) 0.4 MG SL tablet Place 0.4 mg under the tongue every 5 (five) minutes as needed.        Marland Kitchen olopatadine (PATANOL) 0.1 % ophthalmic solution 1 drop 2 (two) times daily.        . rosuvastatin (CRESTOR) 20 MG tablet Take 20 mg by mouth daily.        Marland Kitchen SYNTHROID 200 MCG tablet TAKE 1 TABLET EVERY DAY  30 tablet  4  . isosorbide mononitrate (IMDUR) 60 MG 24 hr tablet Take 120 mg by mouth daily.       Marland Kitchen DISCONTD: amLODipine (NORVASC) 10 MG tablet Take 10 mg by mouth daily.           Past Medical History  Diagnosis Date  . CAD (coronary artery disease)   . HTN (hypertension)   . Hyperlipidemia   . Cerebrovascular accident   . CHF (congestive heart failure)   . Diabetes mellitus   . CKD (chronic kidney disease)   . Unspecified hypothyroidism   . Gout  Past Surgical History  Procedure Date  . Thyroidectomy     History   Social History  . Marital Status: Married    Spouse Name: N/A    Number of Children: N/A  . Years of Education: N/A   Occupational History  . Not on file.   Social History Main Topics  . Smoking status: Former Smoker    Quit date: 10/16/1953  . Smokeless tobacco: Not on file   Comment: quit in the 50's  . Alcohol Use: No  . Drug Use: No  . Sexually Active: Not Currently   Other Topics Concern  . Not on file   Social History Narrative  . No narrative on file    ROS: no fevers or chills, productive cough, hemoptysis, dysphasia, odynophagia, melena, hematochezia, dysuria, hematuria, rash, seizure activity, orthopnea, PND, pedal edema, claudication. Remaining systems are negative.  Physical Exam: Well-developed well-nourished in no acute distress.  Skin is warm and dry.  HEENT is normal.    Neck is supple. No thyromegaly.  Chest is clear to auscultation with normal expansion.  Cardiovascular exam is regular rate and rhythm.  Abdominal exam nontender or distended. No masses palpated. Extremities show no edema. neuro grossly intact  ECG Sinus rhythm with occasional PVCs. First degree AV block. Nonspecific ST changes.

## 2011-04-17 ENCOUNTER — Other Ambulatory Visit: Payer: Self-pay | Admitting: Internal Medicine

## 2011-07-10 ENCOUNTER — Other Ambulatory Visit (INDEPENDENT_AMBULATORY_CARE_PROVIDER_SITE_OTHER): Payer: Medicare Other

## 2011-07-10 DIAGNOSIS — I2510 Atherosclerotic heart disease of native coronary artery without angina pectoris: Secondary | ICD-10-CM

## 2011-07-10 LAB — HEPATIC FUNCTION PANEL
ALT: 22 U/L (ref 0–53)
AST: 19 U/L (ref 0–37)
Alkaline Phosphatase: 31 U/L — ABNORMAL LOW (ref 39–117)
Bilirubin, Direct: 0 mg/dL (ref 0.0–0.3)
Total Protein: 7.5 g/dL (ref 6.0–8.3)

## 2011-07-10 LAB — LIPID PANEL: Cholesterol: 105 mg/dL (ref 0–200)

## 2011-07-11 ENCOUNTER — Encounter: Payer: Self-pay | Admitting: *Deleted

## 2011-07-17 ENCOUNTER — Encounter: Payer: Self-pay | Admitting: Internal Medicine

## 2011-07-17 ENCOUNTER — Ambulatory Visit (INDEPENDENT_AMBULATORY_CARE_PROVIDER_SITE_OTHER): Payer: Medicare Other | Admitting: Internal Medicine

## 2011-07-17 VITALS — BP 140/88 | HR 64 | Temp 98.2°F | Resp 16 | Ht 69.0 in | Wt 191.0 lb

## 2011-07-17 DIAGNOSIS — T887XXA Unspecified adverse effect of drug or medicament, initial encounter: Secondary | ICD-10-CM

## 2011-07-17 DIAGNOSIS — E756 Lipid storage disorder, unspecified: Secondary | ICD-10-CM

## 2011-07-17 DIAGNOSIS — E119 Type 2 diabetes mellitus without complications: Secondary | ICD-10-CM

## 2011-07-17 DIAGNOSIS — E785 Hyperlipidemia, unspecified: Secondary | ICD-10-CM

## 2011-07-17 DIAGNOSIS — M549 Dorsalgia, unspecified: Secondary | ICD-10-CM

## 2011-07-17 DIAGNOSIS — I10 Essential (primary) hypertension: Secondary | ICD-10-CM

## 2011-07-17 LAB — HEMOGLOBIN A1C: Hgb A1c MFr Bld: 7.6 % — ABNORMAL HIGH (ref 4.6–6.5)

## 2011-07-17 MED ORDER — AMLODIPINE BESYLATE 10 MG PO TABS: 10.0000 mg | ORAL_TABLET | Freq: Every day | ORAL | Status: DC

## 2011-07-17 MED ORDER — COLCHICINE 0.6 MG PO TABS: 0.6000 mg | ORAL_TABLET | Freq: Two times a day (BID) | ORAL | Status: DC

## 2011-07-17 MED ORDER — CHLORZOXAZONE 500 MG PO TABS: 500.0000 mg | ORAL_TABLET | Freq: Two times a day (BID) | ORAL | Status: DC | PRN

## 2011-07-17 MED ORDER — COLCHICINE 0.6 MG PO TABS: 0.6000 mg | ORAL_TABLET | Freq: Every day | ORAL | Status: DC

## 2011-07-17 NOTE — Progress Notes (Signed)
Subjective:    Patient ID: [name] [name], male    DOB: November 12, 1935, 75 y.o.   MRN: [id number]  HPI  [name] [name] presents for followup of hypertension hyperlipidemia and adult onset diabetes.  A  lipid panel drawn prior to his visit this shows that he is at goal for both the total cholesterol the HDL the LDL and triglycerides.  We discussed his blood pressure control and his diabetic control He has followup planned with cardiology in December.  History of complaint today is moderate back pain that does not radiate .  Review of Systems  Constitutional: Negative for fever and fatigue.  HENT: Negative for hearing loss, congestion, neck pain and postnasal drip.   Eyes: Negative for discharge, redness and visual disturbance.  Respiratory: Negative for cough, shortness of breath and wheezing.   Cardiovascular: Negative for leg swelling.  Gastrointestinal: Negative for abdominal pain, constipation and abdominal distention.  Genitourinary: Negative for urgency and frequency.  Musculoskeletal: Negative for joint swelling and arthralgias.  Skin: Negative for color change and rash.  Neurological: Negative for weakness and light-headedness.  Hematological: Negative for adenopathy.  Psychiatric/Behavioral: Negative for behavioral problems.   Past Medical History  Diagnosis Date  . CAD (coronary artery disease)   . HTN (hypertension)   . Hyperlipidemia   . Cerebrovascular accident   . CHF (congestive heart failure)   . Diabetes mellitus   . CKD (chronic kidney disease)   . Unspecified hypothyroidism   . Gout     History   Social History  . Marital Status: Married    Spouse Name: N/A    Number of Children: N/A  . Years of Education: N/A   Occupational History  . Not on file.   Social History Main Topics  . Smoking status: Former Smoker    Quit date: 10/16/1953  . Smokeless tobacco: Not on file   Comment: quit in the 50's  . Alcohol Use: No  . Drug Use: No  . Sexually Active: Not  Currently   Other Topics Concern  . Not on file   Social History Narrative  . No narrative on file    Past Surgical History  Procedure Date  . Thyroidectomy     Family History  Problem Relation Age of Onset  . Kidney failure Mother   . Diabetes Mother   . Cancer Father     lung    No Known Allergies  Current Outpatient Prescriptions on File Prior to Visit  Medication Sig Dispense Refill  . aspirin 325 MG tablet Take 325 mg by mouth daily.        . calcitRIOL (ROCALTROL) 0.25 MCG capsule 0.25 mcg. Take one cap by mouth on odd days and 2 caps on even days.       . furosemide (LASIX) 80 MG tablet 1 tab in the morning and 1/2 tab at night      . hydrALAZINE (APRESOLINE) 50 MG tablet Take 1 tablet (50 mg total) by mouth 3 (three) times daily.  90 tablet  12  . HYDROcodone-acetaminophen (VICODIN) 5-500 MG per tablet Take 1 tablet by mouth every 6 (six) hours as needed.  30 tablet  3  . isosorbide mononitrate (IMDUR) 60 MG 24 hr tablet Take 1 tablet (60 mg total) by mouth daily.  30 tablet  12  . nitroGLYCERIN (NITROSTAT) 0.4 MG SL tablet Place 0.4 mg under the tongue every 5 (five) minutes as needed.        Marland Kitchen PATANOL 0.1 % ophthalmic  solution PUT ONE DROP IN [facility] EYE TWO TIMES A DAY  5 mL  0  . rosuvastatin (CRESTOR) 20 MG tablet Take 20 mg by mouth daily.        Marland Kitchen SYNTHROID 200 MCG tablet TAKE 1 TABLET EVERY DAY  30 tablet  4    BP 140/88  Pulse 64  Temp 98.2 F (36.8 C)  Resp 16  Ht 5\' 9"  (1.753 m)  Wt 191 lb (86.637 kg)  BMI 28.21 kg/m2       Objective:   Physical Exam  Nursing note and vitals reviewed. Constitutional: He appears well-developed and well-nourished.  HENT:  Head: Normocephalic and atraumatic.  Eyes: Conjunctivae are normal. Pupils are equal, round, and reactive to light.  Neck: Normal range of motion. Neck supple.  Cardiovascular: Normal rate and regular rhythm.   Pulmonary/Chest: Effort normal and breath sounds normal.  Abdominal: Soft. Bowel  sounds are normal.          Assessment & Plan:  Hypertension stable on current medications no evidence of increasing peripheral edema or CHF.  Diabetes is moderate to poor control hemoglobin A1c will be drawn today discussion of diet weight and exercise and ALT were normal and his diabetic control as well as compliance with his medications.  Hyperlipidemia is at the level of control for him The patient coronary artery disease appears to be stable Probable spondylosis of the back back exercises given for strengthening.

## 2011-07-24 LAB — I-STAT 8, (EC8 V) (CONVERTED LAB)
Acid-base deficit: 3 — ABNORMAL HIGH
Chloride: 108
HCT: 36 — ABNORMAL LOW
Hemoglobin: 12.2 — ABNORMAL LOW
Potassium: 5.6 — ABNORMAL HIGH
Sodium: 137
TCO2: 23
pH, Ven: 7.345 — ABNORMAL HIGH

## 2011-07-24 LAB — BASIC METABOLIC PANEL
CO2: 25
Calcium: 9.3
Chloride: 106
Glucose, Bld: 92
Sodium: 140

## 2011-07-24 LAB — DIFFERENTIAL
Basophils Absolute: 0
Lymphocytes Relative: 31
Monocytes Absolute: 0.4
Monocytes Relative: 11
Neutro Abs: 1.6 — ABNORMAL LOW
Neutrophils Relative %: 50

## 2011-07-24 LAB — POCT CARDIAC MARKERS
CKMB, poc: 1.1
Myoglobin, poc: 273
Operator id: 284251
Troponin i, poc: <0.05
Troponin i, poc: <0.05

## 2011-07-24 LAB — CARDIAC PANEL(CRET KIN+CKTOT+MB+TROPI)
CK, MB: 1.4
Total CK: 146

## 2011-07-24 LAB — HEMOGLOBIN A1C: Mean Plasma Glucose: 215

## 2011-07-24 LAB — POCT I-STAT CREATININE
Creatinine, Ser: 3 — ABNORMAL HIGH
Operator id: 284251

## 2011-07-24 LAB — CBC
Hemoglobin: 11 — ABNORMAL LOW
RBC: 3.51 — ABNORMAL LOW
WBC: 3.2 — ABNORMAL LOW

## 2011-07-24 LAB — TSH: TSH: 5.773 — ABNORMAL HIGH

## 2011-07-28 LAB — BASIC METABOLIC PANEL
Calcium: 10.1
Chloride: 102
GFR calc Af Amer: 33 — ABNORMAL LOW
GFR calc Af Amer: 37 — ABNORMAL LOW
GFR calc non Af Amer: 28 — ABNORMAL LOW
GFR calc non Af Amer: 31 — ABNORMAL LOW
Glucose, Bld: 144 — ABNORMAL HIGH
Potassium: 3.6
Potassium: 4.8
Sodium: 137
Sodium: 138

## 2011-07-28 LAB — PROTIME-INR: INR: 1

## 2011-07-28 LAB — CBC
HCT: 37.8 — ABNORMAL LOW
Hemoglobin: 12.8 — ABNORMAL LOW
RBC: 4.02 — ABNORMAL LOW
RDW: 13.2
WBC: 3.6 — ABNORMAL LOW

## 2011-07-28 LAB — APTT: aPTT: 36

## 2011-08-03 LAB — I-STAT 8, (EC8 V) (CONVERTED LAB)
Acid-base deficit: 2
Bicarbonate: 24
Glucose, Bld: 162 — ABNORMAL HIGH
Hemoglobin: 16.3
Operator id: 285841
Potassium: 3.9
Sodium: 139
TCO2: 25

## 2011-08-03 LAB — POCT I-STAT CREATININE
Creatinine, Ser: 2.7 — ABNORMAL HIGH
Operator id: 285841

## 2011-09-03 ENCOUNTER — Other Ambulatory Visit: Payer: Self-pay | Admitting: Internal Medicine

## 2011-09-17 ENCOUNTER — Other Ambulatory Visit: Payer: Self-pay | Admitting: Internal Medicine

## 2011-10-16 ENCOUNTER — Ambulatory Visit (INDEPENDENT_AMBULATORY_CARE_PROVIDER_SITE_OTHER): Payer: Medicare Other | Admitting: Cardiology

## 2011-10-16 ENCOUNTER — Encounter: Payer: Self-pay | Admitting: Cardiology

## 2011-10-16 VITALS — BP 148/72 | HR 56 | Ht 69.0 in | Wt 194.0 lb

## 2011-10-16 DIAGNOSIS — I2510 Atherosclerotic heart disease of native coronary artery without angina pectoris: Secondary | ICD-10-CM

## 2011-10-16 DIAGNOSIS — I2589 Other forms of chronic ischemic heart disease: Secondary | ICD-10-CM

## 2011-10-16 DIAGNOSIS — I255 Ischemic cardiomyopathy: Secondary | ICD-10-CM

## 2011-10-16 DIAGNOSIS — E785 Hyperlipidemia, unspecified: Secondary | ICD-10-CM

## 2011-10-16 DIAGNOSIS — I10 Essential (primary) hypertension: Secondary | ICD-10-CM

## 2011-10-16 NOTE — Patient Instructions (Signed)
Back Exercises Back exercises help treat and prevent back injuries. The goal of back exercises is to increase the strength of your abdominal and back muscles and the flexibility of your back. These exercises should be started when you no longer have back pain. Back exercises include:  Pelvic Tilt. Lie on your back with your knees bent. Tilt your pelvis until the lower part of your back is against the floor. Hold this position 5 to 10 sec and repeat 5 to 10 times.   Knee to Chest. Pull first 1 knee up against your chest and hold for 20 to 30 seconds, repeat this with the other knee, and then both knees. This may be done with the other leg straight or bent, whichever feels better.   Sit-Ups or Curl-Ups. Bend your knees 90 degrees. Start with tilting your pelvis, and do a partial, slow sit-up, lifting your trunk only 30 to 45 degrees off the floor. Take at least 2 to 3 seconds for each sit-up. Do not do sit-ups with your knees out straight. If partial sit-ups are difficult, simply do the above but with only tightening your abdominal muscles and holding it as directed.   Hip-Lift. Lie on your back with your knees flexed 90 degrees. Push down with your feet and shoulders as you raise your hips a couple inches off the floor; hold for 10 seconds, repeat 5 to 10 times.   Back arches. Lie on your stomach, propping yourself up on bent elbows. Slowly press on your hands, causing an arch in your low back. Repeat 3 to 5 times. Any initial stiffness and discomfort should lessen with repetition over time.   Shoulder-Lifts. Lie face down with arms beside your body. Keep hips and torso pressed to floor as you slowly lift your head and shoulders off the floor.  Do not overdo your exercises, especially in the beginning. Exercises may cause you some mild back discomfort which lasts for a few minutes; however, if the pain is more severe, or lasts for more than 15 minutes, do not continue exercises until you see your  caregiver. Improvement with exercise therapy for back problems is slow.  See your caregivers for assistance with developing a proper back exercise program. Document Released: 11/09/2004 Document Revised: 05/31/2011 Document Reviewed: 10/02/2005 [employer] Patient Information 2012 [employer]. 

## 2011-10-16 NOTE — Assessment & Plan Note (Signed)
Monitored by nephrology. 

## 2011-10-16 NOTE — Patient Instructions (Signed)
Your physician wants you to follow-up in: one year You will receive a reminder letter in the mail two months in advance. If you don't receive a letter, please call our office to schedule the follow-up appointment.  

## 2011-10-16 NOTE — Assessment & Plan Note (Signed)
Blood pressure controlled. Continue present medications. 

## 2011-10-16 NOTE — Progress Notes (Signed)
HPI:Mr. Azimi is a pleasant gentleman with history of coronary artery disease. His most recent catheterization was in March 2005 and showed a 40% left main, no significant disease in the circumflex, total occlusion of the ramus branch with a 50% first marginal, 40% proximal, followed by 95% mid, and 90% mid distal stenosis in the right coronary artery. The patient had successful PCI of the right coronary artery both the mid and mid-to- distal right lesions. Admitted in February of 2012 with dizziness which was felt secondary to a combination of urinary tract infection, narcotics and muscle relaxants. Also with acute on chronic renal insufficiency. There was felt to be a component of dehydration. Echocardiogram in March of 2012 showed an ejection fraction of 35%, mild mitral regurgitation, mildly dilated aortic root, mild right atrial and right ventricular enlargement. VQ scan was low probability. Because of his reduced LV function on echocardiogram we scheduled a Myoview which was performed in April of 2012. The ejection fraction was 44% and there was prior inferior infarct and no ischemia. Since he was last seen in June of 2012, the patient denies any dyspnea on exertion, orthopnea, PND, pedal edema, palpitations, syncope or chest pain.   Current Outpatient Prescriptions  Medication Sig Dispense Refill  . amLODipine (NORVASC) 10 MG tablet Take 1 tablet (10 mg total) by mouth daily.  30 tablet  6  . aspirin 325 MG tablet Take 325 mg by mouth daily.        . calcitRIOL (ROCALTROL) 0.25 MCG capsule 0.25 mcg. Take one cap by mouth on odd days and 2 caps on even days.       . carvedilol (COREG) 3.125 MG tablet Take 6.25 mg by mouth 2 (two) times daily with a meal.        . chlorzoxazone (PARAFON) 500 MG tablet TAKE 1 TABLET TWICE A DAY AS NEEDED  60 tablet  1  . colchicine (COLCRYS) 0.6 MG tablet Take 1 tablet (0.6 mg total) by mouth daily.  30 tablet  11  . furosemide (LASIX) 80 MG tablet 1 tab in  the morning and 1/2 tab at night      . glyBURIDE (DIABETA) 5 MG tablet TAKE 1 TABLET TWICE A DAY  60 tablet  11  . hydrALAZINE (APRESOLINE) 50 MG tablet Take 1 tablet (50 mg total) by mouth 3 (three) times daily.  90 tablet  12  . HYDROcodone-acetaminophen (VICODIN) 5-500 MG per tablet Take 1 tablet by mouth every 6 (six) hours as needed.  30 tablet  3  . isosorbide mononitrate (IMDUR) 60 MG 24 hr tablet Take 1 tablet (60 mg total) by mouth daily.  30 tablet  12  . nitroGLYCERIN (NITROSTAT) 0.4 MG SL tablet Place 0.4 mg under the tongue every 5 (five) minutes as needed.        Marland Kitchen PATANOL 0.1 % ophthalmic solution PUT ONE DROP IN [facility] EYE TWO TIMES A DAY  5 mL  0  . rosuvastatin (CRESTOR) 20 MG tablet Take 20 mg by mouth daily.        Marland Kitchen SYNTHROID 200 MCG tablet TAKE 1 TABLET EVERY DAY  30 tablet  4     Past Medical History  Diagnosis Date  . CAD (coronary artery disease)   . HTN (hypertension)   . Hyperlipidemia   . Cerebrovascular accident   . CHF (congestive heart failure)   . Diabetes mellitus   . CKD (chronic kidney disease)   . Unspecified hypothyroidism   . Gout  Past Surgical History  Procedure Date  . Thyroidectomy     History   Social History  . Marital Status: Married    Spouse Name: N/A    Number of Children: N/A  . Years of Education: N/A   Occupational History  . Not on file.   Social History Main Topics  . Smoking status: Former Smoker    Quit date: 10/16/1953  . Smokeless tobacco: Not on file   Comment: quit in the 50's  . Alcohol Use: No  . Drug Use: No  . Sexually Active: Not Currently   Other Topics Concern  . Not on file   Social History Narrative  . No narrative on file    ROS: no fevers or chills, productive cough, hemoptysis, dysphasia, odynophagia, melena, hematochezia, dysuria, hematuria, rash, seizure activity, orthopnea, PND, pedal edema, claudication. Remaining systems are negative.  Physical Exam: Well-developed well-nourished  in no acute distress.  Skin is warm and dry.  HEENT is normal.  Neck is supple. No thyromegaly.  Chest is clear to auscultation with normal expansion.  Cardiovascular exam is regular rate and rhythm.  Abdominal exam nontender or distended. No masses palpated. Extremities show no edema. neuro grossly intact  ECG sinus bradycardia at a rate of 56. Axis normal. First degree AV block. Nonspecific lateral T-wave changes.

## 2011-10-16 NOTE — Assessment & Plan Note (Signed)
Continue Coreg, hydralazine and nitrates. No ACE inhibitor given renal insufficiency.

## 2011-10-16 NOTE — Assessment & Plan Note (Signed)
Continue statin. 

## 2011-10-16 NOTE — Assessment & Plan Note (Signed)
Continue aspirin and statin. Last Myoview showed no ischemia. 

## 2011-10-25 ENCOUNTER — Other Ambulatory Visit: Payer: Self-pay | Admitting: Internal Medicine

## 2011-11-14 ENCOUNTER — Other Ambulatory Visit: Payer: Self-pay | Admitting: Internal Medicine

## 2011-11-20 ENCOUNTER — Encounter: Payer: Self-pay | Admitting: Internal Medicine

## 2011-11-20 ENCOUNTER — Ambulatory Visit (INDEPENDENT_AMBULATORY_CARE_PROVIDER_SITE_OTHER): Payer: Medicare Other | Admitting: Internal Medicine

## 2011-11-20 VITALS — BP 140/74 | HR 72 | Temp 98.3°F | Resp 16 | Ht 69.0 in | Wt 199.0 lb

## 2011-11-20 DIAGNOSIS — I2589 Other forms of chronic ischemic heart disease: Secondary | ICD-10-CM

## 2011-11-20 DIAGNOSIS — E119 Type 2 diabetes mellitus without complications: Secondary | ICD-10-CM

## 2011-11-20 DIAGNOSIS — E785 Hyperlipidemia, unspecified: Secondary | ICD-10-CM

## 2011-11-20 DIAGNOSIS — E039 Hypothyroidism, unspecified: Secondary | ICD-10-CM

## 2011-11-20 DIAGNOSIS — I10 Essential (primary) hypertension: Secondary | ICD-10-CM

## 2011-11-20 DIAGNOSIS — I255 Ischemic cardiomyopathy: Secondary | ICD-10-CM

## 2011-11-20 DIAGNOSIS — M109 Gout, unspecified: Secondary | ICD-10-CM

## 2011-11-20 DIAGNOSIS — E875 Hyperkalemia: Secondary | ICD-10-CM

## 2011-11-20 LAB — BASIC METABOLIC PANEL
BUN: 70 mg/dL — ABNORMAL HIGH (ref 6–23)
Chloride: 105 mEq/L (ref 96–112)
Creatinine, Ser: 3.1 mg/dL — ABNORMAL HIGH (ref 0.4–1.5)
GFR: 25.65 mL/min — ABNORMAL LOW (ref >60.00)
Glucose, Bld: 197 mg/dL — ABNORMAL HIGH (ref 70–99)
Potassium: 4.4 mEq/L (ref 3.5–5.1)

## 2011-11-20 LAB — TSH: TSH: 0.49 u[IU]/mL (ref 0.35–5.50)

## 2011-11-20 MED ORDER — HYDROCODONE-ACETAMINOPHEN 5-500 MG PO TABS: 1.0000 | ORAL_TABLET | Freq: Four times a day (QID) | ORAL | Status: DC | PRN

## 2011-11-20 NOTE — Progress Notes (Signed)
Subjective:    Patient ID: [name] [name], male    DOB: Apr 20, 1936, 76 y.o.   MRN: [id number]  HPI Patient is a 76 year old male who is followed for hypertension hyperlipidemia chronic renal failure and diabetes.  He is followed by cardiology that has assessed him as stable and allowed him a one-year followup.  He is followed for chronic renal failure by [facility] his last creatinine was 2.7.  Today she is doing well his blood pressure is adequately controlled. He brings a list of blood sugars that range from a high of 112 to a low of 70 and average about 90.  Suspect his A1c will be good he is stabilized her medications she is also treated for hypothyroidism therefore a TSH will be monitored today as well as an A1c   Review of Systems  Constitutional: Negative for fever and fatigue.  HENT: Negative for hearing loss, congestion, neck pain and postnasal drip.   Eyes: Negative for discharge, redness and visual disturbance.  Respiratory: Negative for cough, shortness of breath and wheezing.   Cardiovascular: Negative for leg swelling.  Gastrointestinal: Negative for abdominal pain, constipation and abdominal distention.  Genitourinary: Negative for urgency and frequency.  Musculoskeletal: Negative for joint swelling and arthralgias.  Skin: Negative for color change and rash.  Neurological: Negative for weakness and light-headedness.  Hematological: Negative for adenopathy.  Psychiatric/Behavioral: Negative for behavioral problems.   Past Medical History  Diagnosis Date  . CAD (coronary artery disease)   . HTN (hypertension)   . Hyperlipidemia   . Cerebrovascular accident   . CHF (congestive heart failure)   . Diabetes mellitus   . CKD (chronic kidney disease)   . Unspecified hypothyroidism   . Gout     History   Social History  . Marital Status: Married    Spouse Name: N/A    Number of Children: N/A  . Years of Education: N/A   Occupational History    . Not on file.   Social History Main Topics  . Smoking status: Former Smoker    Quit date: 10/16/1953  . Smokeless tobacco: Not on file   Comment: quit in the 50's  . Alcohol Use: No  . Drug Use: No  . Sexually Active: Not Currently   Other Topics Concern  . Not on file   Social History Narrative  . No narrative on file    Past Surgical History  Procedure Date  . Thyroidectomy     Family History  Problem Relation Age of Onset  . Kidney failure Mother   . Diabetes Mother   . Cancer Father     lung    No Known Allergies  Current Outpatient Prescriptions on File Prior to Visit  Medication Sig Dispense Refill  . amLODipine (NORVASC) 10 MG tablet Take 1 tablet (10 mg total) by mouth daily.  30 tablet  6  . aspirin 325 MG tablet Take 325 mg by mouth daily.        . calcitRIOL (ROCALTROL) 0.25 MCG capsule 0.25 mcg. Take one cap by mouth on odd days and 2 caps on even days.       . carvedilol (COREG) 3.125 MG tablet Take 6.25 mg by mouth 2 (two) times daily with a meal.        . chlorzoxazone (PARAFON) 500 MG tablet TAKE 1 TABLET TWICE A DAY AS NEEDED  60 tablet  1  . colchicine (COLCRYS) 0.6 MG tablet Take 1 tablet (0.6 mg total) by  mouth daily.  30 tablet  11  . furosemide (LASIX) 80 MG tablet 1 tab in the morning and 1/2 tab at night      . glyBURIDE (DIABETA) 5 MG tablet TAKE 1 TABLET TWICE A DAY  60 tablet  11  . hydrALAZINE (APRESOLINE) 50 MG tablet Take 1 tablet (50 mg total) by mouth 3 (three) times daily.  90 tablet  12  . HYDROcodone-acetaminophen (VICODIN) 5-500 MG per tablet Take 1 tablet by mouth every 6 (six) hours as needed.  30 tablet  3  . isosorbide mononitrate (IMDUR) 60 MG 24 hr tablet Take 1 tablet (60 mg total) by mouth daily.  30 tablet  12  . nitroGLYCERIN (NITROSTAT) 0.4 MG SL tablet Place 0.4 mg under the tongue every 5 (five) minutes as needed.        Marland Kitchen PATANOL 0.1 % ophthalmic solution PUT ONE DROP IN [facility] EYE TWO TIMES A DAY  5 mL  0  .  rosuvastatin (CRESTOR) 20 MG tablet Take 20 mg by mouth daily.        Marland Kitchen SYNTHROID 200 MCG tablet TAKE 1 TABLET EVERY DAY  30 tablet  4    BP 140/74  Pulse 72  Temp 98.3 F (36.8 C)  Resp 16  Ht 5\' 9"  (1.753 m)  Wt 199 lb (90.266 kg)  BMI 29.39 kg/m2       Objective:   Physical Exam  Nursing note and vitals reviewed. Constitutional: He appears well-developed and well-nourished.  HENT:  Head: Normocephalic and atraumatic.  Eyes: Conjunctivae are normal. Pupils are equal, round, and reactive to light.  Neck: Normal range of motion. Neck supple.  Cardiovascular: Normal rate and regular rhythm.   Pulmonary/Chest: Effort normal and breath sounds normal.  Abdominal: Soft. Bowel sounds are normal.          Assessment & Plan:  We will draw hemoglobin A1c today to monitor his diabetic control but I suspect the A1c will be between 6 and 7 we will monitor a basic metabolic panel to assure that his creatinine is stable and that his potassium is within range.  Will try TSH T3 and T4 today for his thyroid control

## 2011-11-20 NOTE — Patient Instructions (Signed)
The patient is instructed to continue all medications as prescribed. Schedule followup with check out clerk upon leaving the clinic  

## 2011-11-27 ENCOUNTER — Ambulatory Visit: Payer: Medicare Other | Admitting: Internal Medicine

## 2011-11-27 ENCOUNTER — Encounter: Payer: Self-pay | Admitting: Family

## 2011-11-27 ENCOUNTER — Ambulatory Visit (INDEPENDENT_AMBULATORY_CARE_PROVIDER_SITE_OTHER): Payer: Medicare Other | Admitting: Family

## 2011-11-27 VITALS — BP 158/80 | Temp 98.0°F | Wt 199.0 lb

## 2011-11-27 DIAGNOSIS — M25569 Pain in unspecified knee: Secondary | ICD-10-CM

## 2011-11-27 DIAGNOSIS — E119 Type 2 diabetes mellitus without complications: Secondary | ICD-10-CM

## 2011-11-27 DIAGNOSIS — M25469 Effusion, unspecified knee: Secondary | ICD-10-CM

## 2011-11-27 DIAGNOSIS — M25561 Pain in right knee: Secondary | ICD-10-CM

## 2011-11-27 NOTE — Patient Instructions (Signed)
Knee Effusion °The medical term for having fluid in your knee is effusion. This is often due to an internal derangement of the knee. This means something is wrong inside the knee. Some of the causes of fluid in the knee may be torn cartilage, a torn ligament, or bleeding into the joint from an injury. Your knee is likely more difficult to bend and move. This is often because there is increased pain and pressure in the joint. The time it takes for recovery from a knee effusion depends on different factors, including:  °· Type of injury.  °· Your age.  °· Physical and medical conditions.  °· Rehabilitation Strategies.  °How long you will be away from your normal activities will depend on what kind of knee problem you have and how much damage is present. Your knee has two types of cartilage. Articular cartilage covers the bone ends and lets your knee bend and move smoothly. Two menisci, thick pads of cartilage that form a rim inside the joint, help absorb shock and stabilize your knee. Ligaments bind the bones together and support your knee joint. Muscles move the joint, help support your knee, and take stress off the joint itself. °CAUSES  °Often an effusion in the knee is caused by an injury to one of the menisci. This is often a tear in the cartilage. Recovery after a meniscus injury depends on how much meniscus is damaged and whether you have damaged other knee tissue. Small tears may heal on their own with conservative treatment. Conservative means rest, limited weight bearing activity and muscle strengthening exercises. Your recovery may take up to 6 weeks.  °TREATMENT  °Larger tears may require surgery. Meniscus injuries may be treated during arthroscopy. Arthroscopy is a procedure in which your surgeon uses a small telescope like instrument to look in your knee. Your caregiver can make a more accurate diagnosis (learning what is wrong) by performing an arthroscopic procedure. °If your injury is on the inner  margin of the meniscus, your surgeon may trim the meniscus back to a smooth rim. In other cases your surgeon will try to repair a damaged meniscus with stitches (sutures). This may make rehabilitation take longer, but may provide better long term result by helping your knee keep its shock absorption capabilities. °Ligaments which are completely torn usually require surgery for repair. °HOME CARE INSTRUCTIONS °· Use crutches as instructed.  °· If a brace is applied, use as directed.  °· Once you are home, an ice pack applied to your swollen knee may help with discomfort and help decrease swelling.  °· Keep your knee raised (elevated) when you are not up and around or on crutches.  °· Only take over-the-counter or prescription medicines for pain, discomfort, or fever as directed by your caregiver.  °· Your caregivers will help with instructions for rehabilitation of your knee. This often includes strengthening exercises.  °· You may resume a normal diet and activities as directed.  °SEEK MEDICAL CARE IF:  °· There is increased swelling in your knee.  °· You notice redness, swelling, or increasing pain in your knee.  °· An unexplained oral temperature above 102° F (38.9° C) develops.  °SEEK IMMEDIATE MEDICAL CARE IF:  °· You develop a rash.  °· You have difficulty breathing.  °· You have any allergic reactions from medications you may have been given.  °· There is severe pain with any motion of the knee.  °MAKE SURE YOU:  °· Understand these instructions.  °·   Will watch your condition.  °· Will get help right away if you are not doing well or get worse.  °Document Released: 12/23/2003 Document Revised: 06/14/2011 Document Reviewed: 02/26/2008 °[employer]® Patient Information ©2012 [employer]. °

## 2011-11-27 NOTE — Progress Notes (Signed)
  Subjective:    Patient ID: [name] [name], male    DOB: 04-Feb-1936, 76 y.o.   MRN: [id number]  HPI 76 year old an American male patient of Dr. Lovell Sheehan is an requesting her right knee injection. He periodically has joint injections done for osteoarthritis of the right knee with effusion. He has a history of type 2 diabetes that has been stable, tolerating his medications well. Patient rates the pain on the left hand, that is worse with walking, better with sitting. The patient reports orthopedics recommendation has been for total replacement of the right knee. However, due to his cardiac conditions he's been unable to undergo anesthesia.   Review of Systems  Constitutional: Negative.   Respiratory: Negative.   Cardiovascular: Negative.   Gastrointestinal: Negative.   Musculoskeletal: Positive for joint swelling and arthralgias.       Right knee pain  Neurological: Negative.   Psychiatric/Behavioral: Negative.        Objective:   Physical Exam  Constitutional: He is oriented to person, place, and time. He appears well-developed and well-nourished.  HENT:  Right Ear: External ear normal.  Left Ear: External ear normal.  Nose: Nose normal.  Mouth/Throat: Oropharynx is clear and moist.  Neck: Normal range of motion. Neck supple.  Cardiovascular: Normal rate, regular rhythm and normal heart sounds.   Pulmonary/Chest: Effort normal and breath sounds normal.  Musculoskeletal: He exhibits tenderness.       Right knee pain and swelling.   Neurological: He is alert and oriented to person, place, and time.  Skin: Skin is warm and dry.  Psychiatric: He has a normal mood and affect.    Informed consent obtained and the patient's knee was prepped with betadine. Local anesthesia was obtained with topical spray. Then 60 mg of Depo-Medrol and 2cc of lidocaine was injected into the joint space. The patient tolerated the procedure without complications. Post injection care discussed with  patient.      Assessment & Plan:  Assessment: Right Knee Pain, Right knee Joint Effusion,  Plan: Instructions given. Recheck with Dr. Lovell Sheehan as scheduled.

## 2012-01-04 ENCOUNTER — Other Ambulatory Visit: Payer: Self-pay | Admitting: Internal Medicine

## 2012-01-10 ENCOUNTER — Other Ambulatory Visit: Payer: Self-pay | Admitting: Internal Medicine

## 2012-01-14 ENCOUNTER — Other Ambulatory Visit: Payer: Self-pay | Admitting: Internal Medicine

## 2012-01-29 ENCOUNTER — Telehealth: Payer: Self-pay | Admitting: Internal Medicine

## 2012-01-29 NOTE — Telephone Encounter (Signed)
Pt requesting samples of meds for cholesterol. Did not know name of medication

## 2012-01-29 NOTE — Telephone Encounter (Signed)
Samples are out front and pt informed

## 2012-02-12 ENCOUNTER — Other Ambulatory Visit: Payer: Self-pay | Admitting: Internal Medicine

## 2012-03-11 ENCOUNTER — Other Ambulatory Visit: Payer: Self-pay | Admitting: Internal Medicine

## 2012-03-18 ENCOUNTER — Telehealth: Payer: Self-pay

## 2012-03-18 ENCOUNTER — Ambulatory Visit (INDEPENDENT_AMBULATORY_CARE_PROVIDER_SITE_OTHER): Payer: Medicare Other | Admitting: Internal Medicine

## 2012-03-18 ENCOUNTER — Encounter: Payer: Self-pay | Admitting: Internal Medicine

## 2012-03-18 VITALS — BP 164/80 | HR 72 | Temp 98.2°F | Resp 16 | Ht 69.0 in | Wt 193.0 lb

## 2012-03-18 DIAGNOSIS — I2510 Atherosclerotic heart disease of native coronary artery without angina pectoris: Secondary | ICD-10-CM

## 2012-03-18 DIAGNOSIS — I2589 Other forms of chronic ischemic heart disease: Secondary | ICD-10-CM

## 2012-03-18 DIAGNOSIS — I10 Essential (primary) hypertension: Secondary | ICD-10-CM

## 2012-03-18 LAB — BASIC METABOLIC PANEL
BUN: 74 mg/dL — ABNORMAL HIGH (ref 6–23)
Calcium: 9.5 mg/dL (ref 8.4–10.5)
Creatinine, Ser: 3 mg/dL — ABNORMAL HIGH (ref 0.4–1.5)

## 2012-03-18 LAB — HEMOGLOBIN A1C: Hgb A1c MFr Bld: 7.3 % — ABNORMAL HIGH (ref 4.6–6.5)

## 2012-03-18 MED ORDER — FUROSEMIDE 80 MG PO TABS: ORAL_TABLET | ORAL | Status: DC

## 2012-03-18 MED ORDER — CARVEDILOL 6.25 MG PO TABS: 6.2500 mg | ORAL_TABLET | Freq: Two times a day (BID) | ORAL | Status: DC

## 2012-03-18 MED ORDER — LEVOTHYROXINE SODIUM 200 MCG PO TABS: 200.0000 ug | ORAL_TABLET | Freq: Every day | ORAL | Status: DC

## 2012-03-18 MED ORDER — CARVEDILOL 12.5 MG PO TABS: 12.5000 mg | ORAL_TABLET | Freq: Two times a day (BID) | ORAL | Status: DC

## 2012-03-18 MED ORDER — ISOSORBIDE MONONITRATE ER 60 MG PO TB24: 60.0000 mg | ORAL_TABLET | Freq: Every day | ORAL | Status: DC

## 2012-03-18 NOTE — Telephone Encounter (Signed)
Pharmacy informed 12.5 is correct dosage

## 2012-03-18 NOTE — Telephone Encounter (Signed)
[facility] calling.  They received prescriptions for two different strengths of carvedilol.  They want to make sure if he is supposed to take both of them or if only one then which strength [phone]

## 2012-03-18 NOTE — Progress Notes (Signed)
Subjective:    Patient ID: [name] [name], male    DOB: 01-17-1936, 76 y.o.   MRN: [id number]  HPI Patient is a 76 year old male who presents for followup of hypertension a history of adult-onset diabetes and history of heart disease.  His cardiologist is begun him on carvedilol and a slow titration has been accomplished increasing him to 6.25 mg by mouth twice a day his blood pressure is still elevated his pulse is still in the 70s therefore we will continue this titration increasing the carvedilol to 12-1/2 mg by mouth twice a day. He is not having any chest pain his blood sugars are in excellent range with an average blood sugar of approximately 95 check a renal function for his history of renal insufficiency and we will check a potassium level today we will also monitor his hemoglobin A1c   Review of Systems  Constitutional: Negative for fever and fatigue.  HENT: Negative for hearing loss, congestion, neck pain and postnasal drip.   Eyes: Negative for discharge, redness and visual disturbance.  Respiratory: Negative for cough, shortness of breath and wheezing.   Cardiovascular: Negative for leg swelling.  Gastrointestinal: Negative for abdominal pain, constipation and abdominal distention.  Genitourinary: Negative for urgency and frequency.  Musculoskeletal: Negative for joint swelling and arthralgias.  Skin: Negative for color change and rash.  Neurological: Negative for weakness and light-headedness.  Hematological: Negative for adenopathy.  Psychiatric/Behavioral: Negative for behavioral problems.   Past Medical History  Diagnosis Date  . CAD (coronary artery disease)   . HTN (hypertension)   . Hyperlipidemia   . Cerebrovascular accident   . CHF (congestive heart failure)   . Diabetes mellitus   . CKD (chronic kidney disease)   . Unspecified hypothyroidism   . Gout     History   Social History  . Marital Status: Married    Spouse Name: N/A    Number of Children: N/A    . Years of Education: N/A   Occupational History  . Not on file.   Social History Main Topics  . Smoking status: Former Smoker    Quit date: 10/16/1953  . Smokeless tobacco: Not on file   Comment: quit in the 50's  . Alcohol Use: No  . Drug Use: No  . Sexually Active: Not Currently   Other Topics Concern  . Not on file   Social History Narrative  . No narrative on file    Past Surgical History  Procedure Date  . Thyroidectomy     Family History  Problem Relation Age of Onset  . Kidney failure Mother   . Diabetes Mother   . Cancer Father     lung    No Known Allergies  Current Outpatient Prescriptions on File Prior to Visit  Medication Sig Dispense Refill  . amLODipine (NORVASC) 10 MG tablet TAKE 1 TABLET EVERY DAY  30 tablet  6  . aspirin 325 MG tablet Take 325 mg by mouth daily.        . calcitRIOL (ROCALTROL) 0.25 MCG capsule 0.25 mcg. Take one cap by mouth on odd days and 2 caps on even days.       . chlorzoxazone (PARAFON) 500 MG tablet TAKE 1 TABLET TWICE A DAY AS NEEDED  60 tablet  1  . colchicine (COLCRYS) 0.6 MG tablet Take 1 tablet (0.6 mg total) by mouth daily.  30 tablet  11  . COLCRYS 0.6 MG tablet TAKE 1 TABLET TWICE A DAY  60 tablet  6  . glyBURIDE (DIABETA) 5 MG tablet TAKE 1 TABLET TWICE A DAY  60 tablet  11  . nitroGLYCERIN (NITROSTAT) 0.4 MG SL tablet Place 0.4 mg under the tongue every 5 (five) minutes as needed.        Marland Kitchen PATANOL 0.1 % ophthalmic solution PUT ONE DROP IN [facility] EYE TWO TIMES A DAY  5 mL  0  . rosuvastatin (CRESTOR) 20 MG tablet Take 20 mg by mouth daily.        Marland Kitchen DISCONTD: carvedilol (COREG) 3.125 MG tablet Take 6.25 mg by mouth 2 (two) times daily with a meal.        . DISCONTD: furosemide (LASIX) 80 MG tablet 1 tab in the morning and 1/2 tab at night      . DISCONTD: hydrALAZINE (APRESOLINE) 50 MG tablet Take 1 tablet (50 mg total) by mouth 3 (three) times daily.  90 tablet  12  . DISCONTD: isosorbide mononitrate (IMDUR) 60 MG  24 hr tablet Take 1 tablet (60 mg total) by mouth daily.  30 tablet  12  . DISCONTD: SYNTHROID 200 MCG tablet TAKE 1 TABLET EVERY DAY  30 tablet  4  . DISCONTD: SYNTHROID 200 MCG tablet TAKE 1 TABLET EVERY DAY  30 tablet  11    BP 164/80  Pulse 72  Temp 98.2 F (36.8 C)  Resp 16  Ht 5\' 9"  (1.753 m)  Wt 193 lb (87.544 kg)  BMI 28.50 kg/m2       Objective:   Physical Exam  Nursing note and vitals reviewed. Constitutional: He appears well-developed and well-nourished.  HENT:  Head: Normocephalic and atraumatic.  Eyes: Conjunctivae are normal. Pupils are equal, round, and reactive to light.  Neck: Normal range of motion. Neck supple.  Cardiovascular: Normal rate and regular rhythm.   Pulmonary/Chest: Effort normal and breath sounds normal.  Abdominal: Soft. Bowel sounds are normal.          Assessment & Plan:  Patient has adult-onset diabetes CBGs are in excellent range we'll measure hemoglobin A1c today.  Patient has hypertension and coronary artery disease with a history of renal insufficiency [place] a creatinine BUN and potassium today we'll titrate the carvedilol to 12/2 mg by mouth twice a day.

## 2012-03-18 NOTE — Patient Instructions (Signed)
Use up the carvedilol by taking 2 of the existing prescription twice daily  The new prescription will be for 12.5 twice a day

## 2012-04-10 ENCOUNTER — Other Ambulatory Visit: Payer: Self-pay | Admitting: Internal Medicine

## 2012-04-22 ENCOUNTER — Other Ambulatory Visit: Payer: Self-pay | Admitting: *Deleted

## 2012-04-22 DIAGNOSIS — I2589 Other forms of chronic ischemic heart disease: Secondary | ICD-10-CM

## 2012-04-22 DIAGNOSIS — I2510 Atherosclerotic heart disease of native coronary artery without angina pectoris: Secondary | ICD-10-CM

## 2012-04-22 MED ORDER — CARVEDILOL 12.5 MG PO TABS: 12.5000 mg | ORAL_TABLET | Freq: Two times a day (BID) | ORAL | Status: DC

## 2012-04-22 MED ORDER — AMLODIPINE BESYLATE 10 MG PO TABS: 10.0000 mg | ORAL_TABLET | Freq: Every day | ORAL | Status: DC

## 2012-04-22 MED ORDER — LEVOTHYROXINE SODIUM 200 MCG PO TABS: 200.0000 ug | ORAL_TABLET | Freq: Every day | ORAL | Status: DC

## 2012-04-22 MED ORDER — ISOSORBIDE MONONITRATE ER 60 MG PO TB24: 60.0000 mg | ORAL_TABLET | Freq: Every day | ORAL | Status: DC

## 2012-04-22 MED ORDER — GLYBURIDE 5 MG PO TABS: 5.0000 mg | ORAL_TABLET | Freq: Two times a day (BID) | ORAL | Status: DC

## 2012-04-24 ENCOUNTER — Other Ambulatory Visit: Payer: Self-pay | Admitting: Internal Medicine

## 2012-05-02 ENCOUNTER — Other Ambulatory Visit: Payer: Self-pay | Admitting: Internal Medicine

## 2012-05-09 ENCOUNTER — Ambulatory Visit (INDEPENDENT_AMBULATORY_CARE_PROVIDER_SITE_OTHER): Payer: Medicare Other | Admitting: Family

## 2012-05-09 ENCOUNTER — Encounter: Payer: Self-pay | Admitting: Family

## 2012-05-09 VITALS — BP 120/80 | HR 64 | Temp 98.7°F | Wt 196.0 lb

## 2012-05-09 DIAGNOSIS — R1030 Lower abdominal pain, unspecified: Secondary | ICD-10-CM

## 2012-05-09 DIAGNOSIS — R51 Headache: Secondary | ICD-10-CM

## 2012-05-09 DIAGNOSIS — R0602 Shortness of breath: Secondary | ICD-10-CM

## 2012-05-09 DIAGNOSIS — R109 Unspecified abdominal pain: Secondary | ICD-10-CM

## 2012-05-09 DIAGNOSIS — E119 Type 2 diabetes mellitus without complications: Secondary | ICD-10-CM

## 2012-05-09 DIAGNOSIS — R110 Nausea: Secondary | ICD-10-CM

## 2012-05-09 LAB — GLUCOSE, POCT (MANUAL RESULT ENTRY): POC Glucose: 166 mg/dl — AB (ref 70–99)

## 2012-05-09 LAB — POCT URINALYSIS DIPSTICK
Bilirubin, UA: NEGATIVE
Glucose, UA: NEGATIVE
Leukocytes, UA: NEGATIVE
Nitrite, UA: NEGATIVE
Urobilinogen, UA: 0.2
pH, UA: 5

## 2012-05-09 NOTE — Progress Notes (Signed)
Subjective:    Patient ID: [name] [name], male    DOB: 16-Aug-1936, 76 y.o.   MRN: [id number]  HPI And 76 year old African American male, nonsmoker, patient of Dr. Lovell Sheehan is in today with complaints of headaches, nausea, night sweats x2 days. He believes that it may be related to recently starting hydralazine about 3-4 weeks ago by his nephrologist. His wife states that he's been drinking more Kool-Aid particularly at night about 2-3 glasses. He's also had orange juice. Patient reports brief episodes of shortness of breath. Reports an episode of lower abdominal pain over the pelvis. No frequency or urgency. Pain has resolved. Denies any chest pain, lightheadedness, dizziness, palpitations, or edema. Blood glucose last night was 247. Fasting blood sugar this morning was 99.    Review of Systems  Constitutional: Negative.   Respiratory: Negative.   Cardiovascular: Negative.  Negative for chest pain, palpitations and leg swelling.  Gastrointestinal: Positive for nausea.  Musculoskeletal: Negative.   Skin: Negative.   Neurological: Positive for headaches.  Hematological: Negative.   Psychiatric/Behavioral: Negative.    Past Medical History  Diagnosis Date  . CAD (coronary artery disease)   . HTN (hypertension)   . Hyperlipidemia   . Cerebrovascular accident   . CHF (congestive heart failure)   . Diabetes mellitus   . CKD (chronic kidney disease)   . Unspecified hypothyroidism   . Gout     History   Social History  . Marital Status: Married    Spouse Name: N/A    Number of Children: N/A  . Years of Education: N/A   Occupational History  . Not on file.   Social History Main Topics  . Smoking status: Former Smoker    Quit date: 10/16/1953  . Smokeless tobacco: Not on file   Comment: quit in the 50's  . Alcohol Use: No  . Drug Use: No  . Sexually Active: Not Currently   Other Topics Concern  . Not on file   Social History Narrative  . No narrative on file    Past  Surgical History  Procedure Date  . Thyroidectomy     Family History  Problem Relation Age of Onset  . Kidney failure Mother   . Diabetes Mother   . Cancer Father     lung    No Known Allergies  Current Outpatient Prescriptions on File Prior to Visit  Medication Sig Dispense Refill  . amLODipine (NORVASC) 10 MG tablet Take 1 tablet (10 mg total) by mouth daily.  30 tablet  11  . aspirin 325 MG tablet Take 325 mg by mouth daily.        . calcitRIOL (ROCALTROL) 0.25 MCG capsule 0.25 mcg. Take one cap by mouth on odd days and 2 caps on even days.       . carvedilol (COREG) 12.5 MG tablet Take 1 tablet (12.5 mg total) by mouth 2 (two) times daily with a meal.  60 tablet  6  . chlorzoxazone (PARAFON) 500 MG tablet TAKE 1 TABLET TWICE A DAY AS NEEDED  60 tablet  1  . colchicine (COLCRYS) 0.6 MG tablet Take 1 tablet (0.6 mg total) by mouth daily.  30 tablet  11  . COLCRYS 0.6 MG tablet TAKE 1 TABLET TWICE A DAY  60 tablet  6  . furosemide (LASIX) 80 MG tablet 1 tab in the morning and 1/2 tab at night  60 tablet  6  . glyBURIDE (DIABETA) 5 MG tablet Take 1 tablet (5 mg  total) by mouth 2 (two) times daily with a meal.  60 tablet  11  . hydrALAZINE (APRESOLINE) 50 MG tablet Take 50 mg by mouth. 2 in am ,1 at midday, and 2 in pm      . HYDROcodone-acetaminophen (VICODIN) 5-500 MG per tablet Take 1 tablet by mouth every 6 (six) hours as needed. 2 in am,1 in midday, and 2 in pm      . isosorbide mononitrate (IMDUR) 60 MG 24 hr tablet Take 1 tablet (60 mg total) by mouth daily.  30 tablet  12  . levothyroxine (SYNTHROID) 200 MCG tablet Take 1 tablet (200 mcg total) by mouth daily.  30 tablet  11  . nitroGLYCERIN (NITROSTAT) 0.4 MG SL tablet Place 0.4 mg under the tongue every 5 (five) minutes as needed.        Marland Kitchen PATANOL 0.1 % ophthalmic solution PUT ONE DROP IN [facility] EYE TWO TIMES A DAY  5 mL  0  . rosuvastatin (CRESTOR) 20 MG tablet Take 20 mg by mouth daily.          BP 120/80  Pulse 64  Temp  98.7 F (37.1 C) (Oral)  Wt 196 lb (88.905 kg)  SpO2 96%chart    Objective:   Physical Exam  Constitutional: He is oriented to person, place, and time. He appears well-developed and well-nourished.  HENT:  Head: Normocephalic and atraumatic.  Right Ear: External ear normal.  Left Ear: External ear normal.  Neck: Normal range of motion. Neck supple.  Cardiovascular: Normal rate, regular rhythm and normal heart sounds.   Pulmonary/Chest: Effort normal and breath sounds normal.  Abdominal: Soft. Bowel sounds are normal.  Musculoskeletal: Normal range of motion.  Neurological: He is alert and oriented to person, place, and time. He has normal reflexes.  Skin: Skin is warm and dry.  Psychiatric: He has a normal mood and affect.       urine dip: 2+ protein     Assessment & Plan:   Assessment: Type 2 diabetes, headache, nausea   Plan: I believe his underlying symptoms are related to hyperglycemia particularly at night from his intake of Kool-Aid and orange juice. He also has 2+ protein I've advised him to drink more water to take some of the stress off of his kidneys. His blood pressure is now better controlled with hydralazine all more. So continue medication. Patient to call the office if his symptoms worsen or persist. Recheck a schedule, and when necessary.

## 2012-06-24 ENCOUNTER — Ambulatory Visit (INDEPENDENT_AMBULATORY_CARE_PROVIDER_SITE_OTHER): Payer: Medicare Other | Admitting: Internal Medicine

## 2012-06-24 ENCOUNTER — Encounter: Payer: Self-pay | Admitting: Internal Medicine

## 2012-06-24 VITALS — BP 130/70 | HR 76 | Temp 97.8°F | Resp 18 | Ht 69.0 in | Wt 191.0 lb

## 2012-06-24 DIAGNOSIS — Z23 Encounter for immunization: Secondary | ICD-10-CM

## 2012-06-24 DIAGNOSIS — M62838 Other muscle spasm: Secondary | ICD-10-CM

## 2012-06-24 DIAGNOSIS — E119 Type 2 diabetes mellitus without complications: Secondary | ICD-10-CM

## 2012-06-24 DIAGNOSIS — M1A00X0 Idiopathic chronic gout, unspecified site, without tophus (tophi): Secondary | ICD-10-CM

## 2012-06-24 DIAGNOSIS — I10 Essential (primary) hypertension: Secondary | ICD-10-CM

## 2012-06-24 DIAGNOSIS — E039 Hypothyroidism, unspecified: Secondary | ICD-10-CM

## 2012-06-24 DIAGNOSIS — T887XXA Unspecified adverse effect of drug or medicament, initial encounter: Secondary | ICD-10-CM

## 2012-06-24 DIAGNOSIS — E785 Hyperlipidemia, unspecified: Secondary | ICD-10-CM

## 2012-06-24 DIAGNOSIS — I2510 Atherosclerotic heart disease of native coronary artery without angina pectoris: Secondary | ICD-10-CM

## 2012-06-24 LAB — HEMOGLOBIN A1C: Hgb A1c MFr Bld: 7 % — ABNORMAL HIGH (ref 4.6–6.5)

## 2012-06-24 LAB — LIPID PANEL
HDL: 54.5 mg/dL (ref >39.00)
LDL Cholesterol: 22 mg/dL (ref 0–99)
Total CHOL/HDL Ratio: 2
Triglycerides: 171 mg/dL — ABNORMAL HIGH (ref 0.0–149.0)
VLDL: 34.2 mg/dL (ref 0.0–40.0)

## 2012-06-24 LAB — BASIC METABOLIC PANEL
CO2: 23 mEq/L (ref 19–32)
Chloride: 105 mEq/L (ref 96–112)
Sodium: 140 mEq/L (ref 135–145)

## 2012-06-24 LAB — HEPATIC FUNCTION PANEL: Albumin: 4.5 g/dL (ref 3.5–5.2)

## 2012-06-24 LAB — URIC ACID: Uric Acid, Serum: 9.8 mg/dL — ABNORMAL HIGH (ref 4.0–7.8)

## 2012-06-24 MED ORDER — NITROGLYCERIN 0.4 MG SL SUBL: 0.4000 mg | SUBLINGUAL_TABLET | SUBLINGUAL | Status: DC | PRN

## 2012-06-24 MED ORDER — CHLORZOXAZONE 500 MG PO TABS: 500.0000 mg | ORAL_TABLET | Freq: Three times a day (TID) | ORAL | Status: DC | PRN

## 2012-06-24 NOTE — Patient Instructions (Signed)
The patient is instructed to continue all medications as prescribed. Schedule followup with check out clerk upon leaving the clinic  

## 2012-06-24 NOTE — Progress Notes (Signed)
Subjective:    Patient ID: [name] [name], male    DOB: 1936/09/04, 76 y.o.   MRN: [id number]  HPI The patient is a 76 year old male whose followed for hypertension chronic renal failure hyperlipidemia hypothyroidism and a history of gouty arthritis.  We reviewed his recent visits with cardiology renal and an acute visit in our office.  Today we will be monitoring his lipids his diabetic control a liver and a basic metabolic panel.  Of note in review of the blood work from his renal specialist that his creatinine has increased to about 3.3 and he is in stage III disease.   Review of Systems  Constitutional: Negative for fever and fatigue.  HENT: Positive for congestion. Negative for hearing loss, neck pain and postnasal drip.   Eyes: Negative for discharge, redness and visual disturbance.  Respiratory: Negative for cough, shortness of breath and wheezing.   Cardiovascular: Positive for leg swelling.  Gastrointestinal: Negative for abdominal pain, constipation and abdominal distention.  Genitourinary: Negative for urgency and frequency.  Musculoskeletal: Positive for myalgias and joint swelling. Negative for arthralgias.  Skin: Negative for color change and rash.  Neurological: Positive for weakness. Negative for light-headedness.  Hematological: Negative for adenopathy.  Psychiatric/Behavioral: Negative for behavioral problems.   Past Medical History  Diagnosis Date  . CAD (coronary artery disease)   . HTN (hypertension)   . Hyperlipidemia   . Cerebrovascular accident   . CHF (congestive heart failure)   . Diabetes mellitus   . CKD (chronic kidney disease)   . Unspecified hypothyroidism   . Gout     History   Social History  . Marital Status: Married    Spouse Name: N/A    Number of Children: N/A  . Years of Education: N/A   Occupational History  . Not on file.   Social History Main Topics  . Smoking status: Former Smoker    Quit date: 10/16/1953  . Smokeless  tobacco: Not on file   Comment: quit in the 50's  . Alcohol Use: No  . Drug Use: No  . Sexually Active: Not Currently   Other Topics Concern  . Not on file   Social History Narrative  . No narrative on file    Past Surgical History  Procedure Date  . Thyroidectomy     Family History  Problem Relation Age of Onset  . Kidney failure Mother   . Diabetes Mother   . Cancer Father     lung    No Known Allergies  Current Outpatient Prescriptions on File Prior to Visit  Medication Sig Dispense Refill  . amLODipine (NORVASC) 10 MG tablet Take 1 tablet (10 mg total) by mouth daily.  30 tablet  11  . aspirin 325 MG tablet Take 325 mg by mouth daily.        . calcitRIOL (ROCALTROL) 0.25 MCG capsule 0.25 mcg. Take one cap by mouth on odd days and 2 caps on even days.       . carvedilol (COREG) 12.5 MG tablet Take 1 tablet (12.5 mg total) by mouth 2 (two) times daily with a meal.  60 tablet  6  . chlorzoxazone (PARAFON) 500 MG tablet TAKE 1 TABLET TWICE A DAY AS NEEDED  60 tablet  1  . colchicine (COLCRYS) 0.6 MG tablet Take 1 tablet (0.6 mg total) by mouth daily.  30 tablet  11  . COLCRYS 0.6 MG tablet TAKE 1 TABLET TWICE A DAY  60 tablet  6  .  furosemide (LASIX) 80 MG tablet 1 tab in the morning and 1/2 tab at night  60 tablet  6  . glyBURIDE (DIABETA) 5 MG tablet Take 1 tablet (5 mg total) by mouth 2 (two) times daily with a meal.  60 tablet  11  . hydrALAZINE (APRESOLINE) 50 MG tablet Take 50 mg by mouth. 2 in am ,1 at midday, and 2 in pm      . HYDROcodone-acetaminophen (VICODIN) 5-500 MG per tablet Take 1 tablet by mouth every 6 (six) hours as needed. 2 in am,1 in midday, and 2 in pm      . isosorbide mononitrate (IMDUR) 60 MG 24 hr tablet Take 1 tablet (60 mg total) by mouth daily.  30 tablet  12  . levothyroxine (SYNTHROID) 200 MCG tablet Take 1 tablet (200 mcg total) by mouth daily.  30 tablet  11  . magnesium 30 MG tablet Take 30 mg by mouth at bedtime.      . nitroGLYCERIN  (NITROSTAT) 0.4 MG SL tablet Place 0.4 mg under the tongue every 5 (five) minutes as needed.        Marland Kitchen PATANOL 0.1 % ophthalmic solution PUT ONE DROP IN [facility] EYE TWO TIMES A DAY  5 mL  0  . rosuvastatin (CRESTOR) 20 MG tablet Take 20 mg by mouth daily.          BP 130/70  Pulse 76  Temp 97.8 F (36.6 C)  Resp 18  Ht 5\' 9"  (1.753 m)  Wt 191 lb (86.637 kg)  BMI 28.21 kg/m2       Objective:   Physical Exam  Nursing note and vitals reviewed. Constitutional: He appears well-developed and well-nourished.  HENT:  Head: Normocephalic and atraumatic.  Eyes: Conjunctivae are normal. Pupils are equal, round, and reactive to light.  Cardiovascular: Regular rhythm.   Murmur heard. Musculoskeletal: He exhibits edema and tenderness.  Skin: Skin is warm and dry.  Psychiatric: He has a normal mood and affect. His behavior is normal.          Assessment & Plan:  Patient is on Crestor and we will check a liver as well as a lipid panel today  Patient has mild to moderate cramps in his lower extremities for which he takes Flexeril we will refill his Flexeril.  He is PTH calcium and other parameters are monitored by renal.  His thyroid disease is stable.  We will monitor hemoglobin A1c.  He has mild to moderate peripheral neuropathy related to his diabetes  Patient had one elevated blood glucose but normally from his blood glucoses are ranging between 90 and 100.  The one elevated blood glucose was related to Kool-Aid that his grandchild gave him.

## 2012-08-13 ENCOUNTER — Other Ambulatory Visit: Payer: Self-pay | Admitting: Internal Medicine

## 2012-09-04 ENCOUNTER — Other Ambulatory Visit: Payer: Self-pay | Admitting: Internal Medicine

## 2012-09-11 ENCOUNTER — Other Ambulatory Visit: Payer: Self-pay | Admitting: Internal Medicine

## 2012-09-23 ENCOUNTER — Encounter: Payer: Self-pay | Admitting: Internal Medicine

## 2012-09-23 ENCOUNTER — Ambulatory Visit (INDEPENDENT_AMBULATORY_CARE_PROVIDER_SITE_OTHER): Payer: Medicare Other | Admitting: Internal Medicine

## 2012-09-23 VITALS — BP 146/72 | HR 76 | Temp 98.2°F | Resp 18 | Ht 69.0 in | Wt 194.0 lb

## 2012-09-23 DIAGNOSIS — E038 Other specified hypothyroidism: Secondary | ICD-10-CM

## 2012-09-23 DIAGNOSIS — E1129 Type 2 diabetes mellitus with other diabetic kidney complication: Secondary | ICD-10-CM

## 2012-09-23 DIAGNOSIS — I10 Essential (primary) hypertension: Secondary | ICD-10-CM

## 2012-09-23 LAB — TSH: TSH: 0.99 u[IU]/mL (ref 0.35–5.50)

## 2012-09-23 LAB — HEMOGLOBIN A1C: Hgb A1c MFr Bld: 7.1 % — ABNORMAL HIGH (ref 4.6–6.5)

## 2012-09-23 LAB — BASIC METABOLIC PANEL
Chloride: 104 mEq/L (ref 96–112)
Potassium: 4.4 mEq/L (ref 3.5–5.1)

## 2012-09-23 NOTE — Progress Notes (Signed)
Subjective:    Patient ID: [name] [name], male    DOB: 02-07-1936, 76 y.o.   MRN: [id number]  HPI  Patient is a 76 year old male followed for hypertension hyperlipidemia diabetes with chronic renal insufficiency who presents today for followup of blood pressure followup of diabetes and brings with him a record of his CBGs which have been in the 90s.  Blood pressure is moderately well-controlled today with systolic blood pressure 146 patient's weight is stable but has not been able to lose weight.  Patient has run out of his cholesterol medication so therefore we will defer his cholesterol monitoring until we can resume his medications because of his risk factors is recommended that he be on a statin  Review of Systems  Constitutional: Negative for fever and fatigue.  HENT: Negative for hearing loss, congestion, neck pain and postnasal drip.   Eyes: Negative for discharge, redness and visual disturbance.  Respiratory: Negative for cough, shortness of breath and wheezing.   Cardiovascular: Negative for leg swelling.  Gastrointestinal: Negative for abdominal pain, constipation and abdominal distention.  Genitourinary: Negative for urgency and frequency.  Musculoskeletal: Negative for joint swelling and arthralgias.  Skin: Negative for color change and rash.  Neurological: Negative for weakness and light-headedness.  Hematological: Negative for adenopathy.  Psychiatric/Behavioral: Negative for behavioral problems.   Past Medical History  Diagnosis Date  . CAD (coronary artery disease)   . HTN (hypertension)   . Hyperlipidemia   . Cerebrovascular accident   . CHF (congestive heart failure)   . Diabetes mellitus   . CKD (chronic kidney disease)   . Unspecified hypothyroidism   . Gout     History   Social History  . Marital Status: Married    Spouse Name: N/A    Number of Children: N/A  . Years of Education: N/A   Occupational History  . Not on file.   Social History Main  Topics  . Smoking status: Former Smoker    Quit date: 10/16/1953  . Smokeless tobacco: Not on file     Comment: quit in the 50's  . Alcohol Use: No  . Drug Use: No  . Sexually Active: Not Currently   Other Topics Concern  . Not on file   Social History Narrative  . No narrative on file    Past Surgical History  Procedure Date  . Thyroidectomy     Family History  Problem Relation Age of Onset  . Kidney failure Mother   . Diabetes Mother   . Cancer Father     lung    No Known Allergies  Current Outpatient Prescriptions on File Prior to Visit  Medication Sig Dispense Refill  . amLODipine (NORVASC) 10 MG tablet Take 1 tablet (10 mg total) by mouth daily.  30 tablet  11  . amLODipine (NORVASC) 10 MG tablet TAKE 1 TABLET EVERY DAY  30 tablet  6  . amLODipine (NORVASC) 10 MG tablet TAKE 1 TABLET EVERY DAY  30 tablet  6  . aspirin 325 MG tablet Take 325 mg by mouth daily.        . calcitRIOL (ROCALTROL) 0.25 MCG capsule 0.25 mcg. Take one cap by mouth on odd days and 2 caps on even days.       . carvedilol (COREG) 12.5 MG tablet Take 1 tablet (12.5 mg total) by mouth 2 (two) times daily with a meal.  60 tablet  6  . chlorzoxazone (PARAFON) 500 MG tablet Take 1 tablet (500 mg total)  by mouth 3 (three) times daily as needed for muscle spasms.  60 tablet  1  . COLCRYS 0.6 MG tablet TAKE 1 TABLET TWICE A DAY  60 tablet  6  . furosemide (LASIX) 80 MG tablet 1 tab in the morning and 1/2 tab at night  60 tablet  6  . glyBURIDE (DIABETA) 5 MG tablet TAKE 1 TABLET TWICE A DAY  60 tablet  11  . hydrALAZINE (APRESOLINE) 50 MG tablet Take 50 mg by mouth. 2 in am ,1 at midday, and 2 in pm      . HYDROcodone-acetaminophen (VICODIN) 5-500 MG per tablet Take 1 tablet by mouth every 6 (six) hours as needed. 2 in am,1 in midday, and 2 in pm      . isosorbide mononitrate (IMDUR) 60 MG 24 hr tablet Take 1 tablet (60 mg total) by mouth daily.  30 tablet  12  . levothyroxine (SYNTHROID) 200 MCG  tablet Take 1 tablet (200 mcg total) by mouth daily.  30 tablet  11  . magnesium 30 MG tablet Take 30 mg by mouth at bedtime.      . nitroGLYCERIN (NITROSTAT) 0.4 MG SL tablet Place 1 tablet (0.4 mg total) under the tongue every 5 (five) minutes as needed for chest pain.  25 tablet  12  . PATANOL 0.1 % ophthalmic solution PUT ONE DROP IN [facility] EYE TWO TIMES A DAY  5 mL  0  . rosuvastatin (CRESTOR) 20 MG tablet Take 20 mg by mouth daily.        . [DISCONTINUED] amLODipine (NORVASC) 10 MG tablet TAKE 1 TABLET EVERY DAY  30 tablet  6  . [DISCONTINUED] colchicine (COLCRYS) 0.6 MG tablet Take 1 tablet (0.6 mg total) by mouth daily.  30 tablet  11  . [DISCONTINUED] glyBURIDE (DIABETA) 5 MG tablet Take 1 tablet (5 mg total) by mouth 2 (two) times daily with a meal.  60 tablet  11    BP 146/72  Pulse 76  Temp 98.2 F (36.8 C)  Resp 18  Ht 5\' 9"  (1.753 m)  Wt 194 lb (87.998 kg)  BMI 28.65 kg/m2        Objective:   Physical Exam  Nursing note and vitals reviewed. Constitutional: He appears well-developed and well-nourished.  HENT:  Head: Normocephalic and atraumatic.  Eyes: Conjunctivae normal are normal. Pupils are equal, round, and reactive to light.  Neck: Normal range of motion. Neck supple.  Cardiovascular: Normal rate and regular rhythm.   Pulmonary/Chest: Effort normal and breath sounds normal.  Abdominal: Soft. Bowel sounds are normal.          Assessment & Plan:  We'll monitor hemoglobin A1c today and adjust medications as indicated.  We will look at a basic metabolic panel to assess his creatinine and BUN he has a renal followup visit scheduled soon.  He has no evidence of active congestive heart failure.  His blood pressure is well-controlled.  We will resume his lipid therapy

## 2012-09-23 NOTE — Patient Instructions (Signed)
The patient is instructed to continue all medications as prescribed. Schedule followup with check out clerk upon leaving the clinic  

## 2012-11-01 ENCOUNTER — Ambulatory Visit (INDEPENDENT_AMBULATORY_CARE_PROVIDER_SITE_OTHER): Payer: Medicare Other | Admitting: Cardiology

## 2012-11-01 ENCOUNTER — Encounter: Payer: Self-pay | Admitting: Cardiology

## 2012-11-01 VITALS — BP 130/64 | HR 64 | Resp 18 | Ht 68.0 in | Wt 191.8 lb

## 2012-11-01 DIAGNOSIS — I10 Essential (primary) hypertension: Secondary | ICD-10-CM

## 2012-11-01 DIAGNOSIS — I2589 Other forms of chronic ischemic heart disease: Secondary | ICD-10-CM

## 2012-11-01 DIAGNOSIS — E785 Hyperlipidemia, unspecified: Secondary | ICD-10-CM

## 2012-11-01 DIAGNOSIS — I255 Ischemic cardiomyopathy: Secondary | ICD-10-CM

## 2012-11-01 DIAGNOSIS — I2510 Atherosclerotic heart disease of native coronary artery without angina pectoris: Secondary | ICD-10-CM

## 2012-11-01 NOTE — Assessment & Plan Note (Signed)
Continue beta blocker, hydralazine and nitrates. Not on an ACE inhibitor due to significant renal insufficiency.

## 2012-11-01 NOTE — Assessment & Plan Note (Signed)
Blood pressure controlled. Continue present medications. 

## 2012-11-01 NOTE — Assessment & Plan Note (Signed)
Continue aspirin and statin. Plan Myoview when he returns in one year. 

## 2012-11-01 NOTE — Assessment & Plan Note (Signed)
Followed by nephrology. 

## 2012-11-01 NOTE — Patient Instructions (Addendum)
Your physician wants you to follow-up in: ONE YEAR WITH DR CRENSHAW You will receive a reminder letter in the mail two months in advance. If you don't receive a letter, please call our office to schedule the follow-up appointment.  

## 2012-11-01 NOTE — Progress Notes (Signed)
HPI: [name] [name] is a pleasant gentleman with history of coronary artery disease. His most recent catheterization was in March 2005 and showed a 40% left main, no significant disease in the circumflex, total occlusion of the ramus branch with a 50% first marginal, 40% proximal, followed by 95% mid, and 90% mid distal stenosis in the right coronary artery. The patient had successful PCI of the right coronary artery both the mid and mid-to- distal right lesions. Echocardiogram in March of 2012 showed an ejection fraction of 35%, mild mitral regurgitation, mildly dilated aortic root, mild right atrial and right ventricular enlargement. Because of his reduced LV function on echocardiogram we scheduled a Myoview which was performed in April of 2012. The ejection fraction was 44% and there was prior inferior infarct and no ischemia. Since he was last seen in Dec of 2012, the patient denies any dyspnea on exertion, orthopnea, PND, pedal edema, palpitations, syncope or chest pain.   Current Outpatient Prescriptions  Medication Sig Dispense Refill  . acetaminophen (TYLENOL) 500 MG tablet Take 500 mg by mouth every 6 (six) hours as needed.      Marland Kitchen amLODipine (NORVASC) 10 MG tablet Take 1 tablet (10 mg total) by mouth daily.  30 tablet  11  . aspirin 325 MG tablet Take 325 mg by mouth daily.        . calcitRIOL (ROCALTROL) 0.25 MCG capsule 0.25 mcg. Take one cap by mouth on odd days and 2 caps on even days.       . carvedilol (COREG) 12.5 MG tablet Take 1 tablet (12.5 mg total) by mouth 2 (two) times daily with a meal.  60 tablet  6  . chlorzoxazone (PARAFON) 500 MG tablet Take 1 tablet (500 mg total) by mouth 3 (three) times daily as needed for muscle spasms.  60 tablet  1  . COLCRYS 0.6 MG tablet TAKE 1 TABLET TWICE A DAY  60 tablet  6  . furosemide (LASIX) 80 MG tablet 1 tab in the morning and 1/2 tab at night  60 tablet  6  . glyBURIDE (DIABETA) 5 MG tablet TAKE 1 TABLET TWICE A DAY  60 tablet  11  .  hydrALAZINE (APRESOLINE) 50 MG tablet Take 100 mg by mouth 3 (three) times daily.       Marland Kitchen HYDROcodone-acetaminophen (VICODIN) 5-500 MG per tablet Take 1 tablet by mouth every 6 (six) hours as needed. 2 in am,1 in midday, and 2 in pm      . isosorbide mononitrate (IMDUR) 60 MG 24 hr tablet Take 1 tablet (60 mg total) by mouth daily.  30 tablet  12  . levothyroxine (SYNTHROID) 200 MCG tablet Take 1 tablet (200 mcg total) by mouth daily.  30 tablet  11  . magnesium 30 MG tablet Take 30 mg by mouth 3 times/day as needed-between meals & bedtime.       . nitroGLYCERIN (NITROSTAT) 0.4 MG SL tablet Place 1 tablet (0.4 mg total) under the tongue every 5 (five) minutes as needed for chest pain.  25 tablet  12  . PATANOL 0.1 % ophthalmic solution PUT ONE DROP IN [facility] EYE TWO TIMES A DAY  5 mL  0  . rosuvastatin (CRESTOR) 20 MG tablet Take 20 mg by mouth daily.           Past Medical History  Diagnosis Date  . CAD (coronary artery disease)   . HTN (hypertension)   . Hyperlipidemia   . Cerebrovascular accident   .  CHF (congestive heart failure)   . Diabetes mellitus   . CKD (chronic kidney disease)   . Unspecified hypothyroidism   . Gout     Past Surgical History  Procedure Date  . Thyroidectomy     History   Social History  . Marital Status: Married    Spouse Name: N/A    Number of Children: N/A  . Years of Education: N/A   Occupational History  . Not on file.   Social History Main Topics  . Smoking status: Former Smoker    Quit date: 10/16/1953  . Smokeless tobacco: Not on file     Comment: quit in the 50's  . Alcohol Use: No  . Drug Use: No  . Sexually Active: Not Currently   Other Topics Concern  . Not on file   Social History Narrative  . No narrative on file    ROS: no fevers or chills, productive cough, hemoptysis, dysphasia, odynophagia, melena, hematochezia, dysuria, hematuria, rash, seizure activity, orthopnea, PND, pedal edema, claudication. Remaining systems are  negative.  Physical Exam: Well-developed well-nourished in no acute distress.  Skin is warm and dry.  HEENT is normal.  Neck is supple.  Chest is clear to auscultation with normal expansion.  Cardiovascular exam is regular rate and rhythm.  Abdominal exam nontender or distended. No masses palpated. Extremities show no edema. neuro grossly intact  ECG sinus rhythm at a rate of 64. PVCs noted. First degree AV block. Nonspecific T-wave changes.

## 2012-11-01 NOTE — Assessment & Plan Note (Signed)
Continue statin. Lipids and liver monitored by primary care. 

## 2012-11-04 ENCOUNTER — Ambulatory Visit (INDEPENDENT_AMBULATORY_CARE_PROVIDER_SITE_OTHER): Payer: Medicare Other | Admitting: Family Medicine

## 2012-11-04 ENCOUNTER — Encounter: Payer: Self-pay | Admitting: Family Medicine

## 2012-11-04 VITALS — BP 138/68 | HR 74 | Temp 98.4°F | Wt 195.0 lb

## 2012-11-04 DIAGNOSIS — J069 Acute upper respiratory infection, unspecified: Secondary | ICD-10-CM

## 2012-11-04 DIAGNOSIS — L989 Disorder of the skin and subcutaneous tissue, unspecified: Secondary | ICD-10-CM

## 2012-11-04 MED ORDER — BENZONATATE 100 MG PO CAPS: 100.0000 mg | ORAL_CAPSULE | Freq: Three times a day (TID) | ORAL | Status: DC | PRN

## 2012-11-04 NOTE — Progress Notes (Signed)
Chief Complaint  Patient presents with  . chest congestion    cough, body aches, SOB     HPI:  -started: 3 days ago -symptoms:nasal congestion, sore throat, cough - felt wheezy a few times -denies:fever, SOB, NVD, tooth pain, sinus pain -has tried: musinex -sick contacts: none known -Hx of:no hx of lung disease   ROS: See pertinent positives and negatives per HPI.  Past Medical History  Diagnosis Date  . CAD (coronary artery disease)   . HTN (hypertension)   . Hyperlipidemia   . Cerebrovascular accident   . CHF (congestive heart failure)   . Diabetes mellitus   . CKD (chronic kidney disease)   . Unspecified hypothyroidism   . Gout     Family History  Problem Relation Age of Onset  . Kidney failure Mother   . Diabetes Mother   . Cancer Father     lung    History   Social History  . Marital Status: Married    Spouse Name: N/A    Number of Children: N/A  . Years of Education: N/A   Social History Main Topics  . Smoking status: Former Smoker    Quit date: 10/16/1953  . Smokeless tobacco: None     Comment: quit in the 50's  . Alcohol Use: No  . Drug Use: No  . Sexually Active: Not Currently   Other Topics Concern  . None   Social History Narrative  . None    Current outpatient prescriptions:acetaminophen (TYLENOL) 500 MG tablet, Take 500 mg by mouth every 6 (six) hours as needed., Disp: , Rfl: ;  amLODipine (NORVASC) 10 MG tablet, Take 1 tablet (10 mg total) by mouth daily., Disp: 30 tablet, Rfl: 11;  aspirin 325 MG tablet, Take 325 mg by mouth daily.  , Disp: , Rfl: ;  calcitRIOL (ROCALTROL) 0.25 MCG capsule, 0.25 mcg. Take one cap by mouth on odd days and 2 caps on even days. , Disp: , Rfl:  carvedilol (COREG) 12.5 MG tablet, Take 1 tablet (12.5 mg total) by mouth 2 (two) times daily with a meal., Disp: 60 tablet, Rfl: 6;  chlorzoxazone (PARAFON) 500 MG tablet, Take 1 tablet (500 mg total) by mouth 3 (three) times daily as needed for muscle spasms., Disp:  60 tablet, Rfl: 1;  COLCRYS 0.6 MG tablet, TAKE 1 TABLET TWICE A DAY, Disp: 60 tablet, Rfl: 6 furosemide (LASIX) 80 MG tablet, 1 tab in the morning and 1/2 tab at night, Disp: 60 tablet, Rfl: 6;  glyBURIDE (DIABETA) 5 MG tablet, TAKE 1 TABLET TWICE A DAY, Disp: 60 tablet, Rfl: 11;  hydrALAZINE (APRESOLINE) 50 MG tablet, Take 100 mg by mouth 3 (three) times daily. , Disp: , Rfl: ;  HYDROcodone-acetaminophen (VICODIN) 5-500 MG per tablet, Take 1 tablet by mouth every 6 (six) hours as needed. 2 in am,1 in midday, and 2 in pm, Disp: , Rfl:  isosorbide mononitrate (IMDUR) 60 MG 24 hr tablet, Take 1 tablet (60 mg total) by mouth daily., Disp: 30 tablet, Rfl: 12;  levothyroxine (SYNTHROID) 200 MCG tablet, Take 1 tablet (200 mcg total) by mouth daily., Disp: 30 tablet, Rfl: 11;  magnesium 30 MG tablet, Take 30 mg by mouth 3 times/day as needed-between meals & bedtime. , Disp: , Rfl:  nitroGLYCERIN (NITROSTAT) 0.4 MG SL tablet, Place 1 tablet (0.4 mg total) under the tongue every 5 (five) minutes as needed for chest pain., Disp: 25 tablet, Rfl: 12;  PATANOL 0.1 % ophthalmic solution, PUT ONE DROP  IN [facility] EYE TWO TIMES A DAY, Disp: 5 mL, Rfl: 0;  rosuvastatin (CRESTOR) 20 MG tablet, Take 20 mg by mouth daily.  , Disp: , Rfl:  benzonatate (TESSALON PERLES) 100 MG capsule, Take 1 capsule (100 mg total) by mouth 3 (three) times daily as needed for cough., Disp: 20 capsule, Rfl: 0  EXAM:  Filed Vitals:   11/04/12 1040  BP: 138/68  Pulse: 74  Temp: 98.4 F (36.9 C)    There is no height on file to calculate BMI.  GENERAL: vitals reviewed and listed above, alert, oriented, appears well hydrated and in no acute distress  HEENT: atraumatic, conjunttiva clear, no obvious abnormalities on inspection of external nose and ears, normal appearance of ear canals and TMs, clear nasal congestion, mild post oropharyngeal erythema with PND, no tonsillar edema or exudate, no sinus TTP  NECK: no obvious masses on  inspection  LUNGS: clear to auscultation bilaterally, no wheezes, rales or rhonchi, good air movement  CV: HRRR, no peripheral edema  MS: moves all extremities without noticeable abnormality  PSYCH: pleasant and cooperative, no obvious depression or anxiety  ASSESSMENT AND PLAN:  Discussed the following assessment and plan:  1. Upper respiratory infection  benzonatate (TESSALON PERLES) 100 MG capsule  2. Skin lesion     -Upper resp inf - likely viral, normal lung exam today - advised supportive care and return precautions -small punched out lesion R ear - pt reports didn't know about it, wife reports has been there for years unchanged - pt will notify PCP at scheduled physical to recheck this area -Patient advised to return or notify a doctor immediately if symptoms worsen or persist or new concerns arise.  Patient Instructions  FOR YOUR EAR WOUND: -use antibiotic ointment daily -if not better in 2 weeks see your doctor  INSTRUCTIONS FOR UPPER RESPIRATORY INFECTION:  -plenty of rest and fluids  -nasal saline wash 2-3 times daily (use prepackaged nasal saline or bottled/distilled water if making your own)    -can use sinex nasal spray for drainage and nasal congestion - but do NOT use longer then 3-4 days  -can use tylenol or ibuprofen as directed for aches and sorethroat  -in the winter time, using a humidifier at night is helpful (please follow cleaning instructions)  -if you are taking a cough medication - use only as directed, may also try a teaspoon of honey to coat the throat and throat lozenges  -for sore throat, salt water gargles can help  -follow up if you have fevers, facial pain, tooth pain, difficulty breathing or are worsening or not getting better in 5-7 days      Eliazer Hemphill R.

## 2012-11-04 NOTE — Patient Instructions (Addendum)
FOR YOUR EAR WOUND: -use antibiotic ointment daily -if not better in 2 weeks see your doctor  INSTRUCTIONS FOR UPPER RESPIRATORY INFECTION:  -plenty of rest and fluids  -nasal saline wash 2-3 times daily (use prepackaged nasal saline or bottled/distilled water if making your own)    -can use sinex nasal spray for drainage and nasal congestion - but do NOT use longer then 3-4 days  -can use tylenol or ibuprofen as directed for aches and sorethroat  -in the winter time, using a humidifier at night is helpful (please follow cleaning instructions)  -if you are taking a cough medication - use only as directed, may also try a teaspoon of honey to coat the throat and throat lozenges  -for sore throat, salt water gargles can help  -follow up if you have fevers, facial pain, tooth pain, difficulty breathing or are worsening or not getting better in 5-7 days

## 2012-12-10 ENCOUNTER — Other Ambulatory Visit: Payer: Self-pay | Admitting: Internal Medicine

## 2013-01-02 ENCOUNTER — Encounter: Payer: Self-pay | Admitting: Family

## 2013-01-02 ENCOUNTER — Ambulatory Visit (INDEPENDENT_AMBULATORY_CARE_PROVIDER_SITE_OTHER): Payer: Medicare Other | Admitting: Family

## 2013-01-02 VITALS — BP 160/82 | HR 71 | Wt 191.0 lb

## 2013-01-02 DIAGNOSIS — I10 Essential (primary) hypertension: Secondary | ICD-10-CM

## 2013-01-02 DIAGNOSIS — M1710 Unilateral primary osteoarthritis, unspecified knee: Secondary | ICD-10-CM

## 2013-01-02 MED ORDER — METHYLPREDNISOLONE ACETATE 40 MG/ML IJ SUSP
80.0000 mg | Freq: Once | INTRAMUSCULAR | Status: AC
  Administered 2013-01-02: 80 mg via INTRAMUSCULAR

## 2013-01-02 MED ORDER — MELOXICAM 7.5 MG PO TABS
7.5000 mg | ORAL_TABLET | Freq: Every day | ORAL | Status: DC
Start: 2013-01-02 — End: 2013-08-28

## 2013-01-02 MED ORDER — HYDROCODONE-ACETAMINOPHEN 5-325 MG PO TABS: 1.0000 | ORAL_TABLET | Freq: Four times a day (QID) | ORAL | Status: DC | PRN

## 2013-01-02 NOTE — Patient Instructions (Addendum)
Osteoarthritis Osteoarthritis is the most common form of arthritis. It is redness, soreness, and swelling (inflammation) affecting the cartilage. Cartilage acts as a cushion, covering the ends of bones where they meet to form a joint. CAUSES  Over time, the cartilage begins to wear away. This causes bone to rub on bone. This produces pain and stiffness in the affected joints. Factors that contribute to this problem are:  Excessive body weight.  Age.  Overuse of joints. SYMPTOMS   People with osteoarthritis usually experience joint pain, swelling, or stiffness.  Over time, the joint may lose its normal shape.  Small deposits of bone (osteophytes) may grow on the edges of the joint.  Bits of bone or cartilage can break off and float inside the joint space. This may cause more pain and damage.  Osteoarthritis can lead to depression, anxiety, feelings of helplessness, and limitations on daily activities. The most commonly affected joints are in the:  Ends of the fingers.  Thumbs.  Neck.  Lower back.  Knees.  Hips. DIAGNOSIS  Diagnosis is mostly based on your symptoms and exam. Tests may be helpful, including:  X-rays of the affected joint.  A computerized magnetic scan (MRI).  Blood tests to rule out other types of arthritis.  Joint fluid tests. This involves using a needle to draw fluid from the joint and examining the fluid under a microscope. TREATMENT  Goals of treatment are to control pain, improve joint function, maintain a normal body weight, and maintain a healthy lifestyle. Treatment approaches may include:  A prescribed exercise program with rest and joint relief.  Weight control with nutritional education.  Pain relief techniques such as:  Properly applied heat and cold.  Electric pulses delivered to nerve endings under the skin (transcutaneous electrical nerve stimulation, TENS).  Massage.  Certain supplements. Ask your caregiver before using any  supplements, especially in combination with prescribed drugs.  Medicines to control pain, such as:  Acetaminophen.  Nonsteroidal anti-inflammatory drugs (NSAIDs), such as naproxen.  Narcotic or central-acting agents, such as tramadol. This drug carries a risk of addiction and is generally prescribed for short-term use.  Corticosteroids. These can be given orally or as injection. This is a short-term treatment, not recommended for routine use.  Surgery to reposition the bones and relieve pain (osteotomy) or to remove loose pieces of bone and cartilage. Joint replacement may be needed in advanced states of osteoarthritis. HOME CARE INSTRUCTIONS  Your caregiver can recommend specific types of exercise. These may include:  Strengthening exercises. These are done to strengthen the muscles that support joints affected by arthritis. They can be performed with weights or with exercise bands to add resistance.  Aerobic activities. These are exercises, such as brisk walking or low-impact aerobics, that get your heart pumping. They can help keep your lungs and circulatory system in shape.  Range-of-motion activities. These keep your joints limber.  Balance and agility exercises. These help you maintain daily living skills. Learning about your condition and being actively involved in your care will help improve the course of your osteoarthritis. SEEK MEDICAL CARE IF:   You feel hot or your skin turns red.  You develop a rash in addition to your joint pain.  You have an oral temperature above 102 F (38.9 C). FOR MORE INFORMATION  National Institute of Arthritis and Musculoskeletal and Skin Diseases: www.niams.nih.gov National Institute on Aging: www.nia.nih.gov American College of Rheumatology: www.rheumatology.org Document Released: 10/02/2005 Document Revised: 12/25/2011 Document Reviewed: 01/13/2010 [employer] Patient Information 2013 [employer].  

## 2013-01-03 NOTE — Progress Notes (Signed)
Subjective:    Patient ID: [name] [name], male    DOB: [birth year], 77 y.o.   MRN: [id number]  HPI 77 year old AAM, nonsmoker is in today with c/o right knee pain, bilateral ankle pain, foot pain x 2 days and worsening. Described as achy and stiff. Has been taking Norco but without relief. Has a history of osteoarthritis.     Review of Systems  Constitutional: Negative.   Respiratory: Negative.   Cardiovascular: Negative.   Gastrointestinal: Negative.   Endocrine: Negative.   Genitourinary: Negative.   Musculoskeletal: Positive for arthralgias.  Skin: Positive for color change.  Allergic/Immunologic: Negative.   Neurological: Negative.   Hematological: Negative.   Psychiatric/Behavioral: Negative.    Past Medical History  Diagnosis Date  . CAD (coronary artery disease)   . HTN (hypertension)   . Hyperlipidemia   . Cerebrovascular accident   . CHF (congestive heart failure)   . Diabetes mellitus   . CKD (chronic kidney disease)   . Unspecified hypothyroidism   . Gout     History   Social History  . Marital Status: Married    Spouse Name: N/A    Number of Children: N/A  . Years of Education: N/A   Occupational History  . Not on file.   Social History Main Topics  . Smoking status: Former Smoker    Quit date: 10/16/1953  . Smokeless tobacco: Not on file     Comment: quit in the 50's  . Alcohol Use: No  . Drug Use: No  . Sexually Active: Not Currently   Other Topics Concern  . Not on file   Social History Narrative  . No narrative on file    Past Surgical History  Procedure Laterality Date  . Thyroidectomy      Family History  Problem Relation Age of Onset  . Kidney failure Mother   . Diabetes Mother   . Cancer Father     lung    No Known Allergies  Current Outpatient Prescriptions on File Prior to Visit  Medication Sig Dispense Refill  . acetaminophen (TYLENOL) 500 MG tablet Take 500 mg by mouth every 6 (six) hours as needed.      Marland Kitchen  amLODipine (NORVASC) 10 MG tablet Take 1 tablet (10 mg total) by mouth daily.  30 tablet  11  . aspirin 325 MG tablet Take 325 mg by mouth daily.        . benzonatate (TESSALON PERLES) 100 MG capsule Take 1 capsule (100 mg total) by mouth 3 (three) times daily as needed for cough.  20 capsule  0  . calcitRIOL (ROCALTROL) 0.25 MCG capsule 0.25 mcg. Take one cap by mouth on odd days and 2 caps on even days.       . carvedilol (COREG) 12.5 MG tablet Take 1 tablet (12.5 mg total) by mouth 2 (two) times daily with a meal.  60 tablet  6  . chlorzoxazone (PARAFON) 500 MG tablet Take 1 tablet (500 mg total) by mouth 3 (three) times daily as needed for muscle spasms.  60 tablet  1  . COLCRYS 0.6 MG tablet TAKE 1 TABLET TWICE A DAY  60 tablet  6  . furosemide (LASIX) 80 MG tablet TAKE 1 TAB IN THE MORNING AND 1/2 TAB AT NIGHT  60 tablet  6  . glyBURIDE (DIABETA) 5 MG tablet TAKE 1 TABLET TWICE A DAY  60 tablet  11  . hydrALAZINE (APRESOLINE) 50 MG tablet Take 100 mg by  mouth 3 (three) times daily.       . isosorbide mononitrate (IMDUR) 60 MG 24 hr tablet Take 1 tablet (60 mg total) by mouth daily.  30 tablet  12  . levothyroxine (SYNTHROID) 200 MCG tablet Take 1 tablet (200 mcg total) by mouth daily.  30 tablet  11  . magnesium 30 MG tablet Take 30 mg by mouth 3 times/day as needed-between meals & bedtime.       . nitroGLYCERIN (NITROSTAT) 0.4 MG SL tablet Place 1 tablet (0.4 mg total) under the tongue every 5 (five) minutes as needed for chest pain.  25 tablet  12  . PATANOL 0.1 % ophthalmic solution PUT ONE DROP IN [facility] EYE TWO TIMES A DAY  5 mL  0  . rosuvastatin (CRESTOR) 20 MG tablet Take 20 mg by mouth daily.         No current facility-administered medications on file prior to visit.    BP 160/82  Pulse 71  Wt 191 lb (86.637 kg)  BMI 29.05 kg/m2  SpO2 96%chart    Objective:   Physical Exam  Constitutional: He is oriented to person, place, and time. He appears well-developed and  well-nourished.  Neck: Normal range of motion. Neck supple. No thyromegaly present.  Cardiovascular: Normal rate, regular rhythm and normal heart sounds.   Pulmonary/Chest: Effort normal and breath sounds normal.  Abdominal: Soft. Bowel sounds are normal.  Musculoskeletal: Normal range of motion. He exhibits no edema and no tenderness.  Neurological: He is alert and oriented to person, place, and time.  Skin: Skin is warm and dry.  Psychiatric: He has a normal mood and affect.          Assessment & Plan:  Assessment: 1. Osteoarthritis-generalized 2. Hypertension  Plan:  Mobic 7.5mg  once a day with food. Refilled Norco. Call the office if symptoms worsen or persist. Recheck as scheduled and as needed.

## 2013-01-27 ENCOUNTER — Encounter: Payer: Self-pay | Admitting: Internal Medicine

## 2013-01-27 ENCOUNTER — Ambulatory Visit (INDEPENDENT_AMBULATORY_CARE_PROVIDER_SITE_OTHER): Payer: Medicare Other | Admitting: Internal Medicine

## 2013-01-27 VITALS — BP 140/70 | HR 72 | Temp 98.6°F | Resp 16 | Ht 68.0 in | Wt 184.0 lb

## 2013-01-27 DIAGNOSIS — E1129 Type 2 diabetes mellitus with other diabetic kidney complication: Secondary | ICD-10-CM

## 2013-01-27 DIAGNOSIS — E785 Hyperlipidemia, unspecified: Secondary | ICD-10-CM

## 2013-01-27 DIAGNOSIS — E039 Hypothyroidism, unspecified: Secondary | ICD-10-CM

## 2013-01-27 DIAGNOSIS — E1165 Type 2 diabetes mellitus with hyperglycemia: Secondary | ICD-10-CM

## 2013-01-27 DIAGNOSIS — M62838 Other muscle spasm: Secondary | ICD-10-CM

## 2013-01-27 DIAGNOSIS — R252 Cramp and spasm: Secondary | ICD-10-CM

## 2013-01-27 DIAGNOSIS — E875 Hyperkalemia: Secondary | ICD-10-CM

## 2013-01-27 LAB — LIPID PANEL
HDL: 45.8 mg/dL (ref >39.00)
Total CHOL/HDL Ratio: 3
VLDL: 23.4 mg/dL (ref 0.0–40.0)

## 2013-01-27 LAB — MAGNESIUM: Magnesium: 2.1 mg/dL (ref 1.5–2.5)

## 2013-01-27 MED ORDER — CHLORZOXAZONE 500 MG PO TABS: 500.0000 mg | ORAL_TABLET | Freq: Three times a day (TID) | ORAL | Status: DC | PRN

## 2013-01-27 MED ORDER — HYDROCODONE-ACETAMINOPHEN 5-325 MG PO TABS: 1.0000 | ORAL_TABLET | Freq: Four times a day (QID) | ORAL | Status: DC | PRN

## 2013-01-27 NOTE — Patient Instructions (Signed)
The patient is instructed to continue all medications as prescribed. Schedule followup with check out clerk upon leaving the clinic  

## 2013-01-27 NOTE — Progress Notes (Signed)
Subjective:    Patient ID: [name] [name], male    DOB: [birth year], 77 y.o.   MRN: [id number]  HPI  followup of diabetes hypertension chronic renal insufficiency and leg cramps.past blood work from [facility] showed a creatinine stable at 3.0 and a potassium that was normal    Review of Systems  Constitutional: Negative.   Eyes: Negative.   Respiratory: Negative.   Cardiovascular: Negative.   Musculoskeletal:       Cramping in calves and hands  Hematological: Negative.   Psychiatric/Behavioral: Negative.        Past Medical History  Diagnosis Date  . CAD (coronary artery disease)   . HTN (hypertension)   . Hyperlipidemia   . Cerebrovascular accident   . CHF (congestive heart failure)   . Diabetes mellitus   . CKD (chronic kidney disease)   . Unspecified hypothyroidism   . Gout     History   Social History  . Marital Status: Married    Spouse Name: N/A    Number of Children: N/A  . Years of Education: N/A   Occupational History  . Not on file.   Social History Main Topics  . Smoking status: Former Smoker    Quit date: 10/16/1953  . Smokeless tobacco: Not on file     Comment: quit in the 50's  . Alcohol Use: No  . Drug Use: No  . Sexually Active: Not Currently   Other Topics Concern  . Not on file   Social History Narrative  . No narrative on file    Past Surgical History  Procedure Laterality Date  . Thyroidectomy      Family History  Problem Relation Age of Onset  . Kidney failure Mother   . Diabetes Mother   . Cancer Father     lung    No Known Allergies  Current Outpatient Prescriptions on File Prior to Visit  Medication Sig Dispense Refill  . acetaminophen (TYLENOL) 500 MG tablet Take 500 mg by mouth every 6 (six) hours as needed.      Marland Kitchen amLODipine (NORVASC) 10 MG tablet Take 1 tablet (10 mg total) by mouth daily.  30 tablet  11  . aspirin 325 MG tablet Take 325 mg by mouth daily.        . benzonatate (TESSALON PERLES) 100 MG  capsule Take 1 capsule (100 mg total) by mouth 3 (three) times daily as needed for cough.  20 capsule  0  . calcitRIOL (ROCALTROL) 0.25 MCG capsule 0.25 mcg. Take one cap by mouth on odd days and 2 caps on even days.       . carvedilol (COREG) 12.5 MG tablet Take 1 tablet (12.5 mg total) by mouth 2 (two) times daily with a meal.  60 tablet  6  . COLCRYS 0.6 MG tablet TAKE 1 TABLET TWICE A DAY  60 tablet  6  . furosemide (LASIX) 80 MG tablet TAKE 1 TAB IN THE MORNING AND 1/2 TAB AT NIGHT  60 tablet  6  . glyBURIDE (DIABETA) 5 MG tablet TAKE 1 TABLET TWICE A DAY  60 tablet  11  . hydrALAZINE (APRESOLINE) 50 MG tablet Take 100 mg by mouth 3 (three) times daily.       . isosorbide mononitrate (IMDUR) 60 MG 24 hr tablet Take 1 tablet (60 mg total) by mouth daily.  30 tablet  12  . levothyroxine (SYNTHROID) 200 MCG tablet Take 1 tablet (200 mcg total) by mouth daily.  30  tablet  11  . magnesium 30 MG tablet Take 30 mg by mouth 3 times/day as needed-between meals & bedtime.       . meloxicam (MOBIC) 7.5 MG tablet Take 1 tablet (7.5 mg total) by mouth daily.  30 tablet  3  . nitroGLYCERIN (NITROSTAT) 0.4 MG SL tablet Place 1 tablet (0.4 mg total) under the tongue every 5 (five) minutes as needed for chest pain.  25 tablet  12  . PATANOL 0.1 % ophthalmic solution PUT ONE DROP IN [facility] EYE TWO TIMES A DAY  5 mL  0  . rosuvastatin (CRESTOR) 20 MG tablet Take 20 mg by mouth daily.         No current facility-administered medications on file prior to visit.    BP 140/70  Pulse 72  Temp(Src) 98.6 F (37 C)  Resp 16  Ht 5\' 8"  (1.727 m)  Wt 184 lb (83.462 kg)  BMI 27.98 kg/m2    Objective:   Physical Exam  Nursing note and vitals reviewed. Constitutional: He appears well-developed and well-nourished.  HENT:  Head: Normocephalic and atraumatic.  Eyes: Conjunctivae are normal. Pupils are equal, round, and reactive to light.  Neck: Normal range of motion. Neck supple.  Cardiovascular: Normal rate  and regular rhythm.   Pulmonary/Chest: Effort normal and breath sounds normal.  Abdominal: Soft. Bowel sounds are normal.  Musculoskeletal:  Leg cramps  Psychiatric: He has a normal mood and affect. His behavior is normal.          Assessment & Plan:  Patient is a 77 year old male with hypertension chronic renal insufficiency hypothyroidism and hyperlipidemia who presents with chief complaint of cramping in his caps and sometimes his hands.  He brings a report of his blood sugars that show excellent control

## 2013-03-11 ENCOUNTER — Encounter: Payer: Self-pay | Admitting: Nephrology

## 2013-03-12 ENCOUNTER — Other Ambulatory Visit: Payer: Self-pay | Admitting: Internal Medicine

## 2013-03-30 ENCOUNTER — Other Ambulatory Visit: Payer: Self-pay | Admitting: Internal Medicine

## 2013-05-12 ENCOUNTER — Other Ambulatory Visit: Payer: Self-pay | Admitting: Internal Medicine

## 2013-05-27 ENCOUNTER — Telehealth: Payer: Self-pay | Admitting: Internal Medicine

## 2013-05-27 NOTE — Telephone Encounter (Signed)
This pharmacy states that pt's daughter ordered a meter from them and strips. They are requesting testing frequency for this patient. Please call back.

## 2013-05-27 NOTE — Telephone Encounter (Signed)
Talked with pt asnd he did not order that.pharmacy informed and they state they will call him

## 2013-06-02 ENCOUNTER — Encounter: Payer: Self-pay | Admitting: Internal Medicine

## 2013-06-02 ENCOUNTER — Other Ambulatory Visit: Payer: Self-pay | Admitting: *Deleted

## 2013-06-02 ENCOUNTER — Ambulatory Visit (INDEPENDENT_AMBULATORY_CARE_PROVIDER_SITE_OTHER): Payer: Medicare Other | Admitting: Internal Medicine

## 2013-06-02 VITALS — BP 130/80 | HR 76 | Temp 98.4°F | Resp 16 | Ht 68.0 in | Wt 184.0 lb

## 2013-06-02 DIAGNOSIS — M62838 Other muscle spasm: Secondary | ICD-10-CM

## 2013-06-02 DIAGNOSIS — E1129 Type 2 diabetes mellitus with other diabetic kidney complication: Secondary | ICD-10-CM

## 2013-06-02 DIAGNOSIS — I129 Hypertensive chronic kidney disease with stage 1 through stage 4 chronic kidney disease, or unspecified chronic kidney disease: Secondary | ICD-10-CM

## 2013-06-02 DIAGNOSIS — R5381 Other malaise: Secondary | ICD-10-CM

## 2013-06-02 DIAGNOSIS — I10 Essential (primary) hypertension: Secondary | ICD-10-CM

## 2013-06-02 LAB — BASIC METABOLIC PANEL
GFR: 22.26 mL/min — ABNORMAL LOW (ref >60.00)
Potassium: 5.1 mEq/L (ref 3.5–5.1)
Sodium: 142 mEq/L (ref 135–145)

## 2013-06-02 LAB — HEMOGLOBIN A1C: Hgb A1c MFr Bld: 6.8 % — ABNORMAL HIGH (ref 4.6–6.5)

## 2013-06-02 LAB — CBC WITH DIFFERENTIAL/PLATELET
Basophils Absolute: 0 10*3/uL (ref 0.0–0.1)
Basophils Relative: 0.7 % (ref 0.0–3.0)
Eosinophils Absolute: 0.2 10*3/uL (ref 0.0–0.7)
Lymphocytes Relative: 22 % (ref 12.0–46.0)
MCHC: 33.4 g/dL (ref 30.0–36.0)
Neutrophils Relative %: 50.9 % (ref 43.0–77.0)
Platelets: 187 10*3/uL (ref 150.0–400.0)
RBC: 3.9 Mil/uL — ABNORMAL LOW (ref 4.22–5.81)
RDW: 13.7 % (ref 11.5–14.6)

## 2013-06-02 LAB — TSH: TSH: 0.63 u[IU]/mL (ref 0.35–5.50)

## 2013-06-02 MED ORDER — HYDROCODONE-ACETAMINOPHEN 5-325 MG PO TABS: 1.0000 | ORAL_TABLET | Freq: Four times a day (QID) | ORAL | Status: DC | PRN

## 2013-06-02 MED ORDER — CHLORZOXAZONE 500 MG PO TABS: 500.0000 mg | ORAL_TABLET | Freq: Three times a day (TID) | ORAL | Status: DC | PRN

## 2013-06-02 MED ORDER — OLOPATADINE HCL 0.1 % OP SOLN: OPHTHALMIC | Status: DC

## 2013-06-02 NOTE — Progress Notes (Signed)
Subjective:    Patient ID: [name] [name], male    DOB: 1936/03/12, 77 y.o.   MRN: [id number]  HPI  The patient cbgs are in the 80 average ranges!!! Last hemoglobin A1c was 7.2 last creatinine was 3.6.  Today we will measure a hemoglobin A1c and a creatinineblood pressure is stable on amlodipine Lasix and hydralazine.  He reports no chest pain abnormal shortness of breath abnormal swelling in his extremities  Review of Systems  Constitutional: Positive for fatigue. Negative for fever.  HENT: Negative for hearing loss, congestion, neck pain and postnasal drip.   Eyes: Negative for discharge, redness and visual disturbance.  Respiratory: Negative for cough, shortness of breath and wheezing.   Cardiovascular: Negative for leg swelling.  Gastrointestinal: Negative for abdominal pain, constipation and abdominal distention.  Genitourinary: Negative for urgency and frequency.  Musculoskeletal: Positive for joint swelling. Negative for arthralgias.  Skin: Negative for color change and rash.  Neurological: Positive for weakness. Negative for light-headedness.  Hematological: Negative for adenopathy.  Psychiatric/Behavioral: Positive for confusion. Negative for behavioral problems.   Past Medical History  Diagnosis Date  . CAD (coronary artery disease)   . HTN (hypertension)   . Hyperlipidemia   . Cerebrovascular accident   . CHF (congestive heart failure)   . Diabetes mellitus   . CKD (chronic kidney disease)   . Unspecified hypothyroidism   . Gout     History   Social History  . Marital Status: Married    Spouse Name: N/A    Number of Children: N/A  . Years of Education: N/A   Occupational History  . Not on file.   Social History Main Topics  . Smoking status: Former Smoker    Quit date: 10/16/1953  . Smokeless tobacco: Not on file     Comment: quit in the 50's  . Alcohol Use: No  . Drug Use: No  . Sexual Activity: Not Currently   Other Topics Concern  . Not on  file   Social History Narrative  . No narrative on file    Past Surgical History  Procedure Laterality Date  . Thyroidectomy      Family History  Problem Relation Age of Onset  . Kidney failure Mother   . Diabetes Mother   . Cancer Father     lung    No Known Allergies  Current Outpatient Prescriptions on File Prior to Visit  Medication Sig Dispense Refill  . acetaminophen (TYLENOL) 500 MG tablet Take 500 mg by mouth every 6 (six) hours as needed.      Marland Kitchen amLODipine (NORVASC) 10 MG tablet TAKE 1 TABLET EVERY DAY  30 tablet  6  . aspirin 325 MG tablet Take 325 mg by mouth daily.        . calcitRIOL (ROCALTROL) 0.25 MCG capsule 0.25 mcg. Take one cap by mouth on odd days and 2 caps on even days.       . carvedilol (COREG) 12.5 MG tablet Take 1 tablet (12.5 mg total) by mouth 2 (two) times daily with a meal.  60 tablet  6  . COLCRYS 0.6 MG tablet TAKE 1 TABLET TWICE A DAY  60 tablet  6  . furosemide (LASIX) 80 MG tablet TAKE 1 TAB IN THE MORNING AND 1/2 TAB AT NIGHT  60 tablet  6  . glyBURIDE (DIABETA) 5 MG tablet TAKE 1 TABLET TWICE A DAY  60 tablet  11  . hydrALAZINE (APRESOLINE) 50 MG tablet Take 100 mg by  mouth 3 (three) times daily.       . isosorbide mononitrate (IMDUR) 60 MG 24 hr tablet TAKE 1 TABLET BY MOUTH DAILY.  30 tablet  11  . levothyroxine (SYNTHROID) 200 MCG tablet Take 1 tablet (200 mcg total) by mouth daily.  30 tablet  11  . magnesium 30 MG tablet Take 30 mg by mouth 3 times/day as needed-between meals & bedtime.       . meloxicam (MOBIC) 7.5 MG tablet Take 1 tablet (7.5 mg total) by mouth daily.  30 tablet  3  . nitroGLYCERIN (NITROSTAT) 0.4 MG SL tablet Place 1 tablet (0.4 mg total) under the tongue every 5 (five) minutes as needed for chest pain.  25 tablet  12  . PATANOL 0.1 % ophthalmic solution PUT ONE DROP IN [facility] EYE TWO TIMES A DAY  5 mL  0  . rosuvastatin (CRESTOR) 20 MG tablet Take 20 mg by mouth daily.        Marland Kitchen SYNTHROID 200 MCG tablet TAKE 1 TABLET  EVERY DAY  30 tablet  11   No current facility-administered medications on file prior to visit.    BP 130/80  Pulse 76  Temp(Src) 98.4 F (36.9 C)  Resp 16  Ht 5\' 8"  (1.727 m)  Wt 184 lb (83.462 kg)  BMI 27.98 kg/m2       Objective:   Physical Exam  Nursing note and vitals reviewed. Constitutional: He appears well-developed and well-nourished.  HENT:  Head: Normocephalic and atraumatic.  Eyes: Conjunctivae are normal. Pupils are equal, round, and reactive to light.  Neck: Normal range of motion. Neck supple.  Cardiovascular: Normal rate and regular rhythm.   Pulmonary/Chest: Effort normal and breath sounds normal.  Abdominal: Soft. Bowel sounds are normal.          Assessment & Plan:  Patient with stage III renal disease and adult-onset diabetes CBGs look good we'll get a hemoglobin A1c today.  His main complaint is mild fatigue and sluggishness we'll monitor CBC and a thyroid profile today in addition to the diabetic labs.

## 2013-06-09 ENCOUNTER — Other Ambulatory Visit: Payer: Self-pay | Admitting: *Deleted

## 2013-06-09 MED ORDER — AMLODIPINE BESYLATE 10 MG PO TABS: ORAL_TABLET | ORAL | Status: DC

## 2013-06-09 MED ORDER — LEVOTHYROXINE SODIUM 200 MCG PO TABS: 200.0000 ug | ORAL_TABLET | Freq: Every day | ORAL | Status: DC

## 2013-06-09 MED ORDER — ISOSORBIDE MONONITRATE ER 60 MG PO TB24: ORAL_TABLET | ORAL | Status: DC

## 2013-06-10 ENCOUNTER — Ambulatory Visit (INDEPENDENT_AMBULATORY_CARE_PROVIDER_SITE_OTHER): Payer: Medicare Other | Admitting: Family

## 2013-06-10 ENCOUNTER — Encounter: Payer: Self-pay | Admitting: Family

## 2013-06-10 VITALS — BP 140/64 | HR 75 | Wt 184.0 lb

## 2013-06-10 DIAGNOSIS — M1710 Unilateral primary osteoarthritis, unspecified knee: Secondary | ICD-10-CM

## 2013-06-10 DIAGNOSIS — M1712 Unilateral primary osteoarthritis, left knee: Secondary | ICD-10-CM

## 2013-06-10 MED ORDER — METHYLPREDNISOLONE ACETATE 40 MG/ML IJ SUSP: 40.0000 mg | Freq: Once | INTRAMUSCULAR | Status: DC

## 2013-06-10 MED ORDER — METHYLPREDNISOLONE ACETATE 80 MG/ML IJ SUSP: 80.0000 mg | Freq: Once | INTRAMUSCULAR | Status: DC

## 2013-06-10 NOTE — Progress Notes (Signed)
Subjective:    Patient ID: [name] [name], male    DOB: 11-24-1935, 77 y.o.   MRN: [id number]  HPI  In requesting bilateral knee injections for osteoarthritis of the knee bilaterally. Reports increased pain in the knees. He has had knee injections in the past with great relief.   Review of Systems  Constitutional: Negative.   Respiratory: Negative.   Cardiovascular: Negative.   Musculoskeletal: Positive for arthralgias.       Bilateral knee pain  Skin: Negative.   Psychiatric/Behavioral: Negative.    Past Medical History  Diagnosis Date  . CAD (coronary artery disease)   . HTN (hypertension)   . Hyperlipidemia   . Cerebrovascular accident   . CHF (congestive heart failure)   . Diabetes mellitus   . CKD (chronic kidney disease)   . Unspecified hypothyroidism   . Gout     History   Social History  . Marital Status: Married    Spouse Name: N/A    Number of Children: N/A  . Years of Education: N/A   Occupational History  . Not on file.   Social History Main Topics  . Smoking status: Former Smoker    Quit date: 10/16/1953  . Smokeless tobacco: Not on file     Comment: quit in the 50's  . Alcohol Use: No  . Drug Use: No  . Sexual Activity: Not Currently   Other Topics Concern  . Not on file   Social History Narrative  . No narrative on file    Past Surgical History  Procedure Laterality Date  . Thyroidectomy      Family History  Problem Relation Age of Onset  . Kidney failure Mother   . Diabetes Mother   . Cancer Father     lung    No Known Allergies  Current Outpatient Prescriptions on File Prior to Visit  Medication Sig Dispense Refill  . acetaminophen (TYLENOL) 500 MG tablet Take 500 mg by mouth every 6 (six) hours as needed.      Marland Kitchen amLODipine (NORVASC) 10 MG tablet TAKE 1 TABLET EVERY DAY  90 tablet  3  . aspirin 325 MG tablet Take 325 mg by mouth daily.        . calcitRIOL (ROCALTROL) 0.25 MCG capsule 0.25 mcg. Take one cap by mouth on odd  days and 2 caps on even days.       . chlorzoxazone (PARAFON) 500 MG tablet Take 1 tablet (500 mg total) by mouth 3 (three) times daily as needed for muscle spasms.  60 tablet  1  . COLCRYS 0.6 MG tablet TAKE 1 TABLET TWICE A DAY  60 tablet  6  . furosemide (LASIX) 80 MG tablet TAKE 1 TAB IN THE MORNING AND 1/2 TAB AT NIGHT  60 tablet  6  . glyBURIDE (DIABETA) 5 MG tablet TAKE 1 TABLET TWICE A DAY  60 tablet  11  . hydrALAZINE (APRESOLINE) 50 MG tablet Take 100 mg by mouth 3 (three) times daily.       Marland Kitchen HYDROcodone-acetaminophen (NORCO/VICODIN) 5-325 MG per tablet Take 1 tablet by mouth every 6 (six) hours as needed for pain.  30 tablet  1  . isosorbide mononitrate (IMDUR) 60 MG 24 hr tablet TAKE 1 TABLET BY MOUTH DAILY.  90 tablet  3  . levothyroxine (SYNTHROID) 200 MCG tablet Take 1 tablet (200 mcg total) by mouth daily.  90 tablet  3  . magnesium 30 MG tablet Take 30 mg by mouth 3  times/day as needed-between meals & bedtime.       . meloxicam (MOBIC) 7.5 MG tablet Take 1 tablet (7.5 mg total) by mouth daily.  30 tablet  3  . nitroGLYCERIN (NITROSTAT) 0.4 MG SL tablet Place 1 tablet (0.4 mg total) under the tongue every 5 (five) minutes as needed for chest pain.  25 tablet  12  . olopatadine (PATANOL) 0.1 % ophthalmic solution PUT ONE DROP IN EACH EYE TWO TIMES A DAY  5 mL  3  . rosuvastatin (CRESTOR) 20 MG tablet Take 20 mg by mouth daily.        . carvedilol (COREG) 12.5 MG tablet Take 1 tablet (12.5 mg total) by mouth 2 (two) times daily with a meal.  60 tablet  6   No current facility-administered medications on file prior to visit.    BP 140/64  Pulse 75  Wt 184 lb (83.462 kg)  BMI 27.98 kg/m2chart    Objective:   Physical Exam  Constitutional: He is oriented to person, place, and time. He appears well-developed and well-nourished.  Cardiovascular: Normal rate, regular rhythm and normal heart sounds.   Pulmonary/Chest: Effort normal.  Musculoskeletal: He exhibits tenderness. He  exhibits no edema.  Neurological: He is alert and oriented to person, place, and time.  Skin: Skin is warm and dry.  Psychiatric: He has a normal mood and affect.     Informed consent obtained and the patient's right knee was prepped with betadine. Local anesthesia was obtained with topical spray. Then 40 mg of Depo-Medrol and 1/2 cc of lidocaine was injected into the joint space. The patient tolerated the procedure without complications. Post injection care discussed with patient.  Left knee was prepped with betadine. Local anesthesia was obtained with topical spray. Then 40 mg of Depo-Medrol and 1/2 cc of lidocaine was injected into the joint space. The patient tolerated the procedure without complications. Post injection care discussed with patient.     Assessment & Plan:  Assessment:  1. Osteoarthritis Knees  Plan: Recheck as needed.

## 2013-06-17 ENCOUNTER — Encounter: Payer: Self-pay | Admitting: Vascular Surgery

## 2013-06-18 ENCOUNTER — Ambulatory Visit: Payer: Medicare Other | Admitting: Vascular Surgery

## 2013-06-18 ENCOUNTER — Other Ambulatory Visit: Payer: Self-pay | Admitting: *Deleted

## 2013-06-18 DIAGNOSIS — N184 Chronic kidney disease, stage 4 (severe): Secondary | ICD-10-CM

## 2013-06-18 DIAGNOSIS — Z01810 Encounter for preprocedural cardiovascular examination: Secondary | ICD-10-CM

## 2013-07-08 ENCOUNTER — Other Ambulatory Visit: Payer: Self-pay | Admitting: Internal Medicine

## 2013-07-10 ENCOUNTER — Other Ambulatory Visit: Payer: Self-pay | Admitting: Vascular Surgery

## 2013-07-10 DIAGNOSIS — N184 Chronic kidney disease, stage 4 (severe): Secondary | ICD-10-CM

## 2013-07-10 DIAGNOSIS — Z01810 Encounter for preprocedural cardiovascular examination: Secondary | ICD-10-CM

## 2013-07-16 ENCOUNTER — Ambulatory Visit: Payer: Medicare Other | Admitting: Vascular Surgery

## 2013-07-16 ENCOUNTER — Encounter (HOSPITAL_COMMUNITY): Payer: Medicare Other

## 2013-07-18 ENCOUNTER — Telehealth: Payer: Self-pay | Admitting: Internal Medicine

## 2013-07-18 NOTE — Telephone Encounter (Signed)
Pharm called wanting verbal for pt's meter, (Fora 12 meter) along w/ lancets and test strips. Pharm states they have sent multiple faxes. Mitzi Davenport says that pt has called them wanting these supplies. [facility]

## 2013-07-18 NOTE — Telephone Encounter (Signed)
Have already talked to pt about [facility] and he stated he did not need anything ,that he had a supply for more than he could use when he used [facility]

## 2013-08-15 ENCOUNTER — Other Ambulatory Visit: Payer: Self-pay | Admitting: Internal Medicine

## 2013-08-28 ENCOUNTER — Other Ambulatory Visit: Payer: Self-pay | Admitting: Family

## 2013-08-29 ENCOUNTER — Telehealth: Payer: Self-pay | Admitting: Internal Medicine

## 2013-08-29 NOTE — Telephone Encounter (Signed)
See below . Thanks

## 2013-08-29 NOTE — Telephone Encounter (Signed)
Pt picked up samples

## 2013-08-29 NOTE — Telephone Encounter (Signed)
crestor 20  

## 2013-08-29 NOTE — Telephone Encounter (Signed)
Pt called stating Dr. Lovell Sheehan told him he could have samples of cholesterol medicine, 20 mg.  He did not know the name.  Please advise if that is available.

## 2013-09-05 ENCOUNTER — Telehealth: Payer: Self-pay | Admitting: Cardiology

## 2013-09-05 ENCOUNTER — Observation Stay (HOSPITAL_COMMUNITY)
Admission: EM | Admit: 2013-09-05 | Discharge: 2013-09-06 | Disposition: A | Discharge status: Home / Self Care | Payer: Medicare Other | Attending: Internal Medicine | Admitting: Internal Medicine

## 2013-09-05 ENCOUNTER — Encounter (HOSPITAL_COMMUNITY): Payer: Self-pay | Admitting: Emergency Medicine

## 2013-09-05 ENCOUNTER — Emergency Department (HOSPITAL_COMMUNITY): Payer: Medicare Other

## 2013-09-05 DIAGNOSIS — M109 Gout, unspecified: Secondary | ICD-10-CM | POA: Insufficient documentation

## 2013-09-05 DIAGNOSIS — Z8673 Personal history of transient ischemic attack (TIA), and cerebral infarction without residual deficits: Secondary | ICD-10-CM | POA: Diagnosis not present

## 2013-09-05 DIAGNOSIS — R252 Cramp and spasm: Secondary | ICD-10-CM

## 2013-09-05 DIAGNOSIS — E875 Hyperkalemia: Secondary | ICD-10-CM

## 2013-09-05 DIAGNOSIS — I2510 Atherosclerotic heart disease of native coronary artery without angina pectoris: Secondary | ICD-10-CM | POA: Insufficient documentation

## 2013-09-05 DIAGNOSIS — Z79899 Other long term (current) drug therapy: Secondary | ICD-10-CM | POA: Insufficient documentation

## 2013-09-05 DIAGNOSIS — R079 Chest pain, unspecified: Secondary | ICD-10-CM | POA: Diagnosis not present

## 2013-09-05 DIAGNOSIS — I129 Hypertensive chronic kidney disease with stage 1 through stage 4 chronic kidney disease, or unspecified chronic kidney disease: Secondary | ICD-10-CM | POA: Diagnosis not present

## 2013-09-05 DIAGNOSIS — Z9889 Other specified postprocedural states: Secondary | ICD-10-CM | POA: Diagnosis not present

## 2013-09-05 DIAGNOSIS — N189 Chronic kidney disease, unspecified: Secondary | ICD-10-CM | POA: Insufficient documentation

## 2013-09-05 DIAGNOSIS — E039 Hypothyroidism, unspecified: Secondary | ICD-10-CM | POA: Insufficient documentation

## 2013-09-05 DIAGNOSIS — M1710 Unilateral primary osteoarthritis, unspecified knee: Secondary | ICD-10-CM

## 2013-09-05 DIAGNOSIS — E119 Type 2 diabetes mellitus without complications: Secondary | ICD-10-CM | POA: Insufficient documentation

## 2013-09-05 DIAGNOSIS — I10 Essential (primary) hypertension: Secondary | ICD-10-CM

## 2013-09-05 DIAGNOSIS — Z87891 Personal history of nicotine dependence: Secondary | ICD-10-CM | POA: Diagnosis not present

## 2013-09-05 DIAGNOSIS — I509 Heart failure, unspecified: Secondary | ICD-10-CM | POA: Diagnosis not present

## 2013-09-05 DIAGNOSIS — R072 Precordial pain: Secondary | ICD-10-CM

## 2013-09-05 DIAGNOSIS — Z8679 Personal history of other diseases of the circulatory system: Secondary | ICD-10-CM

## 2013-09-05 DIAGNOSIS — E1129 Type 2 diabetes mellitus with other diabetic kidney complication: Secondary | ICD-10-CM

## 2013-09-05 DIAGNOSIS — E785 Hyperlipidemia, unspecified: Secondary | ICD-10-CM | POA: Insufficient documentation

## 2013-09-05 DIAGNOSIS — I255 Ischemic cardiomyopathy: Secondary | ICD-10-CM

## 2013-09-05 DIAGNOSIS — N186 End stage renal disease: Secondary | ICD-10-CM | POA: Diagnosis present

## 2013-09-05 DIAGNOSIS — M549 Dorsalgia, unspecified: Secondary | ICD-10-CM

## 2013-09-05 DIAGNOSIS — T887XXA Unspecified adverse effect of drug or medicament, initial encounter: Secondary | ICD-10-CM

## 2013-09-05 LAB — COMPREHENSIVE METABOLIC PANEL
AST: 12 U/L (ref 0–37)
Albumin: 3.9 g/dL (ref 3.5–5.2)
Alkaline Phosphatase: 32 U/L — ABNORMAL LOW (ref 39–117)
BUN: 64 mg/dL — ABNORMAL HIGH (ref 6–23)
Chloride: 101 mEq/L (ref 96–112)
Creatinine, Ser: 4.26 mg/dL — ABNORMAL HIGH (ref 0.50–1.35)
GFR calc Af Amer: 14 mL/min — ABNORMAL LOW (ref >90)
GFR calc non Af Amer: 12 mL/min — ABNORMAL LOW (ref >90)
Potassium: 4.2 mEq/L (ref 3.5–5.1)
Sodium: 140 mEq/L (ref 135–145)
Total Bilirubin: 0.4 mg/dL (ref 0.3–1.2)
Total Protein: 7.3 g/dL (ref 6.0–8.3)

## 2013-09-05 LAB — CREATININE, SERUM
Creatinine, Ser: 4.1 mg/dL — ABNORMAL HIGH (ref 0.50–1.35)
GFR calc non Af Amer: 13 mL/min — ABNORMAL LOW (ref >90)

## 2013-09-05 LAB — CBC
HCT: 35.4 % — ABNORMAL LOW (ref 39.0–52.0)
Hemoglobin: 10.8 g/dL — ABNORMAL LOW (ref 13.0–17.0)
Hemoglobin: 11.4 g/dL — ABNORMAL LOW (ref 13.0–17.0)
MCHC: 32.7 g/dL (ref 30.0–36.0)
MCHC: 32.2 g/dL (ref 30.0–36.0)
MCV: 93.9 fL (ref 78.0–100.0)
Platelets: 118 10*3/uL — ABNORMAL LOW (ref 150–400)
RDW: 13.4 % (ref 11.5–15.5)
WBC: 4.1 10*3/uL (ref 4.0–10.5)
WBC: 4.4 10*3/uL (ref 4.0–10.5)

## 2013-09-05 LAB — GLUCOSE, CAPILLARY

## 2013-09-05 LAB — POCT I-STAT TROPONIN I: Troponin i, poc: 0 ng/mL (ref 0.00–0.08)

## 2013-09-05 MED ORDER — ATORVASTATIN CALCIUM 40 MG PO TABS
40.0000 mg | ORAL_TABLET | Freq: Every day | ORAL | Status: DC
  Filled 2013-09-05 (×2): qty 1

## 2013-09-05 MED ORDER — CARVEDILOL 12.5 MG PO TABS
12.5000 mg | ORAL_TABLET | Freq: Two times a day (BID) | ORAL | Status: DC
Start: 2013-09-06 — End: 2013-09-06
  Administered 2013-09-06: 12.5 mg via ORAL
  Filled 2013-09-05 (×3): qty 1

## 2013-09-05 MED ORDER — ALUM & MAG HYDROXIDE-SIMETH 200-200-20 MG/5ML PO SUSP: 30.0000 mL | Freq: Four times a day (QID) | ORAL | Status: DC | PRN

## 2013-09-05 MED ORDER — CALCITRIOL 0.5 MCG PO CAPS
0.5000 ug | ORAL_CAPSULE | ORAL | Status: DC
  Filled 2013-09-05: qty 1

## 2013-09-05 MED ORDER — ACETAMINOPHEN 325 MG PO TABS: 650.0000 mg | ORAL_TABLET | Freq: Four times a day (QID) | ORAL | Status: DC | PRN

## 2013-09-05 MED ORDER — HEPARIN SODIUM (PORCINE) 5000 UNIT/ML IJ SOLN
5000.0000 [IU] | Freq: Three times a day (TID) | INTRAMUSCULAR | Status: DC
  Administered 2013-09-05 – 2013-09-06 (×4): 5000 [IU] via SUBCUTANEOUS
  Filled 2013-09-05 (×6): qty 1

## 2013-09-05 MED ORDER — SODIUM CHLORIDE 0.9 % IJ SOLN: 3.0000 mL | INTRAMUSCULAR | Status: DC | PRN

## 2013-09-05 MED ORDER — SODIUM CHLORIDE 0.9 % IJ SOLN
3.0000 mL | Freq: Two times a day (BID) | INTRAMUSCULAR | Status: DC
  Administered 2013-09-05 – 2013-09-06 (×2): 3 mL via INTRAVENOUS

## 2013-09-05 MED ORDER — COLCHICINE 0.6 MG PO TABS
0.3000 mg | ORAL_TABLET | Freq: Every day | ORAL | Status: DC
Start: 2013-09-06 — End: 2013-09-06
  Administered 2013-09-06: 0.3 mg via ORAL
  Filled 2013-09-05: qty 0.5

## 2013-09-05 MED ORDER — FUROSEMIDE 40 MG PO TABS
40.0000 mg | ORAL_TABLET | Freq: Every day | ORAL | Status: DC
  Administered 2013-09-05: 40 mg via ORAL
  Filled 2013-09-05 (×2): qty 1

## 2013-09-05 MED ORDER — MORPHINE SULFATE 2 MG/ML IJ SOLN: 1.0000 mg | INTRAMUSCULAR | Status: DC | PRN

## 2013-09-05 MED ORDER — COLCHICINE 0.6 MG PO TABS
0.3000 mg | ORAL_TABLET | Freq: Every day | ORAL | Status: DC
  Filled 2013-09-05: qty 0.5

## 2013-09-05 MED ORDER — INSULIN ASPART 100 UNIT/ML ~~LOC~~ SOLN
0.0000 [IU] | Freq: Every day | SUBCUTANEOUS | Status: DC
  Administered 2013-09-05: 2 [IU] via SUBCUTANEOUS

## 2013-09-05 MED ORDER — CALCITRIOL 0.25 MCG PO CAPS: 0.2500 ug | ORAL_CAPSULE | Freq: Every day | ORAL | Status: DC

## 2013-09-05 MED ORDER — AMLODIPINE BESYLATE 10 MG PO TABS
10.0000 mg | ORAL_TABLET | Freq: Every day | ORAL | Status: DC
Start: 2013-09-06 — End: 2013-09-06
  Administered 2013-09-06: 10 mg via ORAL
  Filled 2013-09-05: qty 1

## 2013-09-05 MED ORDER — INSULIN ASPART 100 UNIT/ML ~~LOC~~ SOLN
0.0000 [IU] | Freq: Three times a day (TID) | SUBCUTANEOUS | Status: DC
  Administered 2013-09-06: 2 [IU] via SUBCUTANEOUS

## 2013-09-05 MED ORDER — NITROGLYCERIN 0.4 MG SL SUBL: 0.4000 mg | SUBLINGUAL_TABLET | SUBLINGUAL | Status: DC | PRN

## 2013-09-05 MED ORDER — ONDANSETRON HCL 4 MG PO TABS: 4.0000 mg | ORAL_TABLET | Freq: Four times a day (QID) | ORAL | Status: DC | PRN

## 2013-09-05 MED ORDER — SODIUM CHLORIDE 0.9 % IJ SOLN: 3.0000 mL | Freq: Two times a day (BID) | INTRAMUSCULAR | Status: DC

## 2013-09-05 MED ORDER — FUROSEMIDE 40 MG PO TABS: 40.0000 mg | ORAL_TABLET | Freq: Two times a day (BID) | ORAL | Status: DC

## 2013-09-05 MED ORDER — LEVOTHYROXINE SODIUM 200 MCG PO TABS
200.0000 ug | ORAL_TABLET | Freq: Every day | ORAL | Status: DC
  Administered 2013-09-06: 200 ug via ORAL
  Filled 2013-09-05 (×2): qty 1

## 2013-09-05 MED ORDER — FUROSEMIDE 80 MG PO TABS
80.0000 mg | ORAL_TABLET | Freq: Every day | ORAL | Status: DC
  Administered 2013-09-06: 80 mg via ORAL
  Filled 2013-09-05 (×2): qty 1

## 2013-09-05 MED ORDER — OLOPATADINE HCL 0.1 % OP SOLN
1.0000 [drp] | Freq: Two times a day (BID) | OPHTHALMIC | Status: DC
  Administered 2013-09-05 – 2013-09-06 (×2): 1 [drp] via OPHTHALMIC
  Filled 2013-09-05: qty 5

## 2013-09-05 MED ORDER — REGADENOSON 0.4 MG/5ML IV SOLN
0.4000 mg | Freq: Once | INTRAVENOUS | Status: AC
  Administered 2013-09-06: 0.4 mg via INTRAVENOUS
  Filled 2013-09-05: qty 5

## 2013-09-05 MED ORDER — ISOSORBIDE MONONITRATE ER 60 MG PO TB24
60.0000 mg | ORAL_TABLET | Freq: Every day | ORAL | Status: DC
  Filled 2013-09-05: qty 1

## 2013-09-05 MED ORDER — COLCHICINE 0.6 MG PO TABS: 0.6000 mg | ORAL_TABLET | Freq: Two times a day (BID) | ORAL | Status: DC

## 2013-09-05 MED ORDER — SODIUM CHLORIDE 0.9 % IV SOLN: 250.0000 mL | INTRAVENOUS | Status: DC | PRN

## 2013-09-05 MED ORDER — CALCITRIOL 0.25 MCG PO CAPS: 0.2500 ug | ORAL_CAPSULE | ORAL | Status: DC

## 2013-09-05 MED ORDER — ASPIRIN 325 MG PO TABS
325.0000 mg | ORAL_TABLET | Freq: Every day | ORAL | Status: DC
  Administered 2013-09-06: 325 mg via ORAL
  Filled 2013-09-05: qty 1

## 2013-09-05 MED ORDER — ONDANSETRON HCL 4 MG/2ML IJ SOLN: 4.0000 mg | Freq: Four times a day (QID) | INTRAMUSCULAR | Status: DC | PRN

## 2013-09-05 MED ORDER — HYDRALAZINE HCL 50 MG PO TABS
100.0000 mg | ORAL_TABLET | Freq: Three times a day (TID) | ORAL | Status: DC
Start: 2013-09-05 — End: 2013-09-06
  Administered 2013-09-05 – 2013-09-06 (×3): 100 mg via ORAL
  Filled 2013-09-05 (×5): qty 2

## 2013-09-05 MED ORDER — ACETAMINOPHEN 650 MG RE SUPP: 650.0000 mg | Freq: Four times a day (QID) | RECTAL | Status: DC | PRN

## 2013-09-05 NOTE — ED Notes (Addendum)
Cp started yesterday called dr today and was sent to er for further tests felt weak he states no n/v /sob states took nitro last night and it made him feel better but it was bad this am

## 2013-09-05 NOTE — ED Provider Notes (Signed)
CSN: [id number]     Arrival date & time 09/05/13  0941 History   First MD Initiated Contact with Patient 09/05/13 1103     Chief Complaint  Patient presents with  . Chest Pain   (Consider location/radiation/quality/duration/timing/severity/associated sxs/prior Treatment) HPI Comments: Patient is a 77 year old male past medical history significant for CAD, HTN, HLD, CHF, DM, CKD, CVA, hypothyroidism presented to the emergency department for central chest pain with radiation to neck and back that began yesterday. Patient describes his pain as throbbing in nature. He states his pain began around noon yesterday. He endorses generalized weakness and fatigue but no nausea, vomiting, shortness of breath. Patient states he took one of his sublingual nitroglycerin tablets and that alleviated his pain, he states it returned and was worse this morning and was advised to come in by his physician. Currently he states he is having 0/10 chest pain. Patient's last cardiac catheterization was in 2005. His last echocardiogram was in April 2012. Patient is followed by Dr. [name] and is scheduled to have an echo performed coming up. Took 325mg  ASA.   Patient is a 77 y.o. male presenting with chest pain.  Chest Pain Associated symptoms: back pain and fatigue   Associated symptoms: no headache, no shortness of breath and not vomiting     Past Medical History  Diagnosis Date  . CAD (coronary artery disease)   . HTN (hypertension)   . Hyperlipidemia   . Cerebrovascular accident   . CHF (congestive heart failure)   . Diabetes mellitus   . CKD (chronic kidney disease)   . Unspecified hypothyroidism   . Gout    Past Surgical History  Procedure Laterality Date  . Thyroidectomy     Family History  Problem Relation Age of Onset  . Kidney failure Mother   . Diabetes Mother   . Cancer Father     lung   History  Substance Use Topics  . Smoking status: Former Smoker    Quit date: 10/16/1953  . Smokeless  tobacco: Not on file     Comment: quit in the 50's  . Alcohol Use: No    Review of Systems  Constitutional: Positive for fatigue.  HENT: Negative.   Eyes: Negative.   Respiratory: Negative for shortness of breath.   Cardiovascular: Positive for chest pain.  Gastrointestinal: Negative for vomiting.  Genitourinary: Negative.   Musculoskeletal: Positive for back pain.  Skin: Negative.   Neurological: Negative for headaches.    Allergies  Tomato  Home Medications   Current Outpatient Rx  Name  Route  Sig  Dispense  Refill  . acetaminophen (TYLENOL) 500 MG tablet   Oral   Take 500 mg by mouth every 6 (six) hours as needed.         Marland Kitchen amLODipine (NORVASC) 10 MG tablet   Oral   Take 10 mg by mouth daily.         Marland Kitchen aspirin 325 MG tablet   Oral   Take 325 mg by mouth daily.           . calcitRIOL (ROCALTROL) 0.25 MCG capsule      0.25 mcg. Take one cap by mouth on odd days and 2 caps on even days.          . carvedilol (COREG) 12.5 MG tablet   Oral   Take 12.5 mg by mouth 2 (two) times daily with a meal.         . chlorzoxazone (PARAFON) 500 MG tablet  Oral   Take 1 tablet (500 mg total) by mouth 3 (three) times daily as needed for muscle spasms.   60 tablet   1   . colchicine 0.6 MG tablet   Oral   Take 0.6 mg by mouth 2 (two) times daily.         . furosemide (LASIX) 80 MG tablet   Oral   Take 40-80 mg by mouth 2 (two) times daily. Takes 80 mg in am and 40 mg in pm         . glyBURIDE (DIABETA) 5 MG tablet   Oral   Take 5 mg by mouth 2 (two) times daily with a meal.         . hydrALAZINE (APRESOLINE) 50 MG tablet   Oral   Take 100 mg by mouth 3 (three) times daily.          Marland Kitchen HYDROcodone-acetaminophen (VICODIN) 5-500 MG per tablet   Oral   Take 1 tablet by mouth every 6 (six) hours as needed for pain.         . isosorbide mononitrate (IMDUR) 60 MG 24 hr tablet   Oral   Take 60 mg by mouth daily.          Marland Kitchen levothyroxine  (SYNTHROID) 200 MCG tablet   Oral   Take 1 tablet (200 mcg total) by mouth daily.   90 tablet   3   . magnesium 30 MG tablet   Oral   Take 30 mg by mouth 3 times/day as needed-between meals & bedtime.          . nitroGLYCERIN (NITROSTAT) 0.4 MG SL tablet   Sublingual   Place 1 tablet (0.4 mg total) under the tongue every 5 (five) minutes as needed for chest pain.   25 tablet   12   . olopatadine (PATANOL) 0.1 % ophthalmic solution   Both Eyes   Place 1 drop into both eyes 2 (two) times daily.         . rosuvastatin (CRESTOR) 20 MG tablet   Oral   Take 20 mg by mouth daily.            BP 112/47  Pulse 57  Temp(Src) 97.9 F (36.6 C)  Resp 13  SpO2 100% Physical Exam  Constitutional: He is oriented to person, place, and time. He appears well-developed and well-nourished. No distress.  HENT:  Head: Normocephalic and atraumatic.  Right Ear: External ear normal.  Left Ear: External ear normal.  Nose: Nose normal.  Eyes: Conjunctivae are normal.  Neck: Neck supple.  Cardiovascular: Normal rate, regular rhythm, normal heart sounds and intact distal pulses.   Pulmonary/Chest: Effort normal and breath sounds normal. No respiratory distress. He has no wheezes. He has no rales.  Abdominal: Soft. Bowel sounds are normal. There is no tenderness.  Musculoskeletal: He exhibits edema (1+ bilateral LE). He exhibits no tenderness.  Lymphadenopathy:    He has no cervical adenopathy.  Neurological: He is alert and oriented to person, place, and time.  Skin: Skin is warm and dry. He is not diaphoretic.    ED Course  Procedures (including critical care time) Labs Review Labs Reviewed  CBC - Abnormal; Notable for the following:    RBC 3.54 (*)    Hemoglobin 10.8 (*)    HCT 33.0 (*)    Platelets 118 (*)    All other components within normal limits  COMPREHENSIVE METABOLIC PANEL - Abnormal; Notable for the following:  Glucose, Bld 183 (*)    BUN 64 (*)    Creatinine, Ser  4.26 (*)    Alkaline Phosphatase 32 (*)    GFR calc non Af Amer 12 (*)    GFR calc Af Amer 14 (*)    All other components within normal limits  POCT I-STAT TROPONIN I  POCT I-STAT TROPONIN I   Imaging Review Dg Chest 2 View  09/05/2013   CLINICAL DATA:  Chest pain.  EXAM: CHEST  2 VIEW  COMPARISON:  Chest x-ray dated 12/23/2010 and 12/15/2010  FINDINGS: There is chronic cardiomegaly. Pulmonary vascularity is at the upper limits of normal. No infiltrates or effusions. Chronic accentuation of the interstitial markings. No acute osseous abnormality.  IMPRESSION: No acute abnormality.  Chronic cardiomegaly.   Electronically Signed   By: Geanie Cooley M.D.   On: 09/05/2013 10:23    EKG Interpretation    Date/Time:  Friday September 05 2013 09:46:15 EST Ventricular Rate:  72 PR Interval:  228 QRS Duration: 106 QT Interval:  420 QTC Calculation: 459 R Axis:   55 Text Interpretation:  Sinus rhythm with 1st degree A-V block with Premature atrial complexes Possible Anterior infarct , age undetermined Abnormal ECG No significant change since last tracing Confirmed by GOLDSTON  MD, SCOTT ([id number]) on 09/05/2013 12:20:31 PM            MDM   1. Chest pain     11:36 AM discussed laboratory, EKG, chest x-ray findings the patient. Advised patient stay for cardiac rule out. Patient states he would prefer to go home. Family history requesting have a chance to speak to the patient regarding this visit prior to making a final decision.    11:49 AM Patient agreeable to stay for [place] @ 1PM   Concern for cardiac etiology of Chest Pain. Cardiology has been consulted and will see patient in the ED for likely admit. Pt does not meet criteria for CP protocol and a further evaluation is recommended. Pt has been re-evaluated prior to consult and VSS, NAD, heart RRR, pain 0/10, lungs CTAB. No acute abnormalities found on EKG and first round of cardiac enzymes negative. This case was discussed with Dr.  Criss Alvine who has seen the patient and agrees with plan to admit.    Jeannetta Ellis, PA-C 09/05/13 1544

## 2013-09-05 NOTE — H&P (Signed)
[facility] History and Physical  MORGEN RITACCO ZOX:[id number] DOB: 11-Sep-1936 DOA: 09/05/2013  Referring physician: Dr. Criss Alvine  PCP: Carrie Mew, MD  Specialists: Dr. [name], [facility]  Chief Complaint: Chest pain  HPI: [name] [name] is a 77 y.o. male with PMH of systolic HF, CAD, HTN, HLD, DM, stage 3-4 CKD and hypothyroidism; came to ED complaining of CP the night prior to admission. Patient reprots 10/10 pain left side chest, started while at rest and radiated to left arm and neck; patient took 2 NTG and pain completely resolved. In the morning he contacted his cardiologist office and was instructed to come to ER for further evaluation and treatment. Currently CP free, denies fever, chills, cough, N/V or any other complaints. In ED troponin negative and EKG w/o acute ischemic changes. Given pain at rest and heart score of 4-5 TRH has been called to admit patient for further evaluation and treatment.   Review of Systems:  Negative except as mentioned on HPI  Past Medical History  Diagnosis Date  . CAD (coronary artery disease)     s/p PCI to mid and mid-distal RCA, 40% sten L main, no sig dz LCx   . HTN (hypertension)   . Hyperlipidemia   . Cerebrovascular accident   . CHF (congestive heart failure)     EF 35% echo 2012 ; EF 44% myoview 2012  . Diabetes mellitus   . 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 Unspecified hypothyroidism   . Gout    Past Surgical History  Procedure Laterality Date  . Thyroidectomy     Social History:  reports that he quit smoking about 59 years ago. His smoking use included Cigarettes. He smoked 0.50 packs per day. He does not have any smokeless tobacco history on file. He reports that he does not drink alcohol or use illicit drugs.   Allergies  Allergen Reactions  . Tomato     Too much acid    Family History  Problem Relation Age of Onset  . Kidney failure Mother   . Diabetes Mother   .  Cancer Father     lung  . Heart attack Brother     massive heart attack in 54s  . Hypertension Mother     Prior to Admission medications   Medication Sig Start Date End Date Taking? Authorizing Provider  acetaminophen (TYLENOL) 500 MG tablet Take 500 mg by mouth every 6 (six) hours as needed.   Yes Historical Provider, MD  amLODipine (NORVASC) 10 MG tablet Take 10 mg by mouth daily.   Yes Historical Provider, MD  aspirin 325 MG tablet Take 325 mg by mouth daily.     Yes Historical Provider, MD  calcitRIOL (ROCALTROL) 0.25 MCG capsule 0.25 mcg. Take one cap by mouth on odd days and 2 caps on even days.    Yes Historical Provider, MD  carvedilol (COREG) 12.5 MG tablet Take 12.5 mg by mouth 2 (two) times daily with a meal.   Yes Historical Provider, MD  chlorzoxazone (PARAFON) 500 MG tablet Take 1 tablet (500 mg total) by mouth 3 (three) times daily as needed for muscle spasms. 06/02/13  Yes Stacie Glaze, MD  colchicine 0.6 MG tablet Take 0.6 mg by mouth 2 (two) times daily.   Yes Historical Provider, MD  furosemide (LASIX) 80 MG tablet Take 40-80 mg by mouth 2 (two) times daily. Takes 80 mg in am and 40 mg in pm  Yes Historical Provider, MD  glyBURIDE (DIABETA) 5 MG tablet Take 5 mg by mouth 2 (two) times daily with a meal.   Yes Historical Provider, MD  hydrALAZINE (APRESOLINE) 50 MG tablet Take 100 mg by mouth 3 (three) times daily.  04/13/11  Yes [name], MD  HYDROcodone-acetaminophen (VICODIN) 5-500 MG per tablet Take 1 tablet by mouth every 6 (six) hours as needed for pain.   Yes Historical Provider, MD  isosorbide mononitrate (IMDUR) 60 MG 24 hr tablet Take 60 mg by mouth daily.  06/09/13  Yes Stacie Glaze, MD  levothyroxine (SYNTHROID) 200 MCG tablet Take 1 tablet (200 mcg total) by mouth daily. 06/09/13  Yes Stacie Glaze, MD  magnesium 30 MG tablet Take 30 mg by mouth 3 times/day as needed-between meals & bedtime.    Yes Historical Provider, MD  nitroGLYCERIN (NITROSTAT) 0.4  MG SL tablet Place 1 tablet (0.4 mg total) under the tongue every 5 (five) minutes as needed for chest pain. 06/24/12 09/05/13 Yes Stacie Glaze, MD  olopatadine (PATANOL) 0.1 % ophthalmic solution Place 1 drop into both eyes 2 (two) times daily. 06/02/13  Yes Stacie Glaze, MD  rosuvastatin (CRESTOR) 20 MG tablet Take 20 mg by mouth daily.     Yes Historical Provider, MD   Physical Exam: Filed Vitals:   09/05/13 1500  BP: 153/71  Pulse: 62  Temp: 97.6 F (36.4 C)  Resp: 16   Constitutional: He is oriented to person, place, and time. He appears well-developed and well-nourished. No distress.  Head: Normocephalic and atraumatic.  Nose: Nose normal.  Eyes: Conjunctivae are normal.  Neck: Neck supple.  Cardiovascular: Normal rate, regular rhythm, normal heart sounds and intact distal pulses. No JVD  Pulmonary/Chest: CTAB  Abdominal: Soft. Bowel sounds are normal. There is no tenderness.  Musculoskeletal: No edema. He exhibits no tenderness.  Neurological: He is alert and oriented to person, place, and time.  Skin: Skin is warm and dry. He is not diaphoretic.   Labs on Admission:  Basic Metabolic Panel:  Recent Labs Lab 09/05/13 0951  NA 140  K 4.2  CL 101  CO2 26  GLUCOSE 183*  BUN 64*  CREATININE 4.26*  CALCIUM 9.6   Liver Function Tests:  Recent Labs Lab 09/05/13 0951  AST 12  ALT 8  ALKPHOS 32*  BILITOT 0.4  PROT 7.3  ALBUMIN 3.9   CBC:  Recent Labs Lab 09/05/13 0951  WBC 4.1  HGB 10.8*  HCT 33.0*  MCV 93.2  PLT 118*   CBG:  Recent Labs Lab 09/05/13 1647  GLUCAP 99    Radiological Exams on Admission: Dg Chest 2 View  09/05/2013   CLINICAL DATA:  Chest pain.  EXAM: CHEST  2 VIEW  COMPARISON:  Chest x-ray dated 12/23/2010 and 12/15/2010  FINDINGS: There is chronic cardiomegaly. Pulmonary vascularity is at the upper limits of normal. No infiltrates or effusions. Chronic accentuation of the interstitial markings. No acute osseous abnormality.   IMPRESSION: No acute abnormality.  Chronic cardiomegaly.   Electronically Signed   By: Geanie Cooley M.D.   On: 09/05/2013 10:23    EKG: Sinus rhythm with 1st degree A-V block with Premature atrial complexes  Possible Anterior infarct , age undetermined  Abnormal ECG  No significant change since last tracing   Assessment/Plan 1-Chest pain: patient with pain at rest, resolved with 2 NTG. Heart score 4-5. Currently CP free -admit to telemetry -ASA, B-blocker, statins -will follow troponin X 2  -  cardiology consulted for stress test and if needed Cath/other recommendations.  2-HYPOTHYROIDISM: continue synthroid  3-Type II or unspecified type diabetes mellitus with renal manifestations: will check A1C; hold glyburide while inpatient. Will start SSI  4-HYPERLIPIDEMIA: will check lipid panel; continue statins  5-HYPERTENSION: continue home medication regimen and heart healthy diet  6-CORONARY ARTERY DISEASE: patient with CP prior to admission. -Will need further stratification. S/p stent in the past. -continue statins, B-blocker and ASA -cardiology on board will follow rec's  7-Systolic CONGESTIVE HEART FAILURE: last EF 35-40% on 2-D echo March 2014. -currently compensated -continue current medication regimen -follow daily weight and strict I's and O's -no ACE/ARB due to renal failure  8-Chronic renal disease, stage III: slightly worse than prior results. -lasix 40Mg  BID -close follow up of renal function -big trouble if cath is needed; might end on HD   DVT: heparin  Cardiology (Dr. Theda Belfast)  Code Status: Full Family Communication: wife and son at bedside Disposition Plan: telemetry, observation, LOS < 2 midnights  Time spent: 50 minutes  Tanyiah Laurich [facility] Pager [phone]  If 7PM-7AM, please contact night-coverage [email or web address] Password [facility] 09/05/2013, 4:58 PM

## 2013-09-05 NOTE — Telephone Encounter (Signed)
New Problem:  Pt states he had a "spell" with his heart last night. After asking pt if he had chest pain, the pt states he did. Pt states he took two nitro. Pt states he is still SOB. Pt states he chest pain has subsided at the moment.

## 2013-09-05 NOTE — ED Notes (Signed)
Report given to 3W RN

## 2013-09-05 NOTE — Telephone Encounter (Signed)
Spoke with Dr. [name] who advised patient go to the ER.  I spoke with patient who verbalized agreement to go to [facility] ER.  I notified Trish, hospital liason.

## 2013-09-05 NOTE — Consult Note (Signed)
CARDIOLOGY CONSULT NOTE   Patient ID: [name] [name] MRN: [id number] DOB/AGE: 06/26/1936 77 y.o.  Admit date: 09/05/2013  Primary Physician   Carrie Mew, MD Primary Cardiologist   Dr. [name] Reason for Consultation   Chest pain  HPI: [name] [name] is a 77 year old male with a history of CAD s/p PCI to RCA in 2005, HTN, HLD, systolic CHF, DM, stage 4 CKD, CVA and hypothyroidism who presented to the ED for left sided chest pain that occurred last night while watching TV. The pain was constant, 10/10 pain and radiated bilaterally to neck and back. He described it as throbbing: "like my heart was sore." He reports SOB and dizziness. He denies palpitations, diaphoresis, n/v,  abdominal pain or edema.   Two SL nitro completely relieved his pain and he went to bed. He called Dr. Ludwig Clarks office when he woke up in the morning and they advised him to go to the emergency department. Currently he states he is having 0/10 chest pain.  Echocardiogram in March of 2012 showed an ejection fraction of 35%, mild mitral regurgitation, mildly dilated aortic root, mild right atrial and right ventricular enlargement. Because of his reduced LV function on echocardiogram we scheduled a Myoview which was performed in April of 2012. The ejection fraction was 44% and there was prior inferior infarct and no ischemia.    Past Medical History  Diagnosis Date  . CAD (coronary artery disease)     s/p PCI to mid and mid-distal RCA, 40% sten L main, no sig dz LCx   . HTN (hypertension)   . Hyperlipidemia   . Cerebrovascular accident   . CHF (congestive heart failure)     EF 35% echo 2012 ; EF 44% myoview 2012  . Diabetes mellitus   . 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 Unspecified hypothyroidism   . Gout      Past Surgical History  Procedure Laterality Date  . Thyroidectomy      Allergies  Allergen Reactions  . Tomato     Too much acid    I have reviewed the patient's  current medications . [START ON 09/06/2013] regadenoson  0.4 mg Intravenous Once       Prior to Admission medications   Medication Sig Start Date End Date Taking? Authorizing Provider  acetaminophen (TYLENOL) 500 MG tablet Take 500 mg by mouth every 6 (six) hours as needed.   Yes Historical Provider, MD  amLODipine (NORVASC) 10 MG tablet Take 10 mg by mouth daily.   Yes Historical Provider, MD  aspirin 325 MG tablet Take 325 mg by mouth daily.     Yes Historical Provider, MD  calcitRIOL (ROCALTROL) 0.25 MCG capsule 0.25 mcg. Take one cap by mouth on odd days and 2 caps on even days.    Yes Historical Provider, MD  carvedilol (COREG) 12.5 MG tablet Take 12.5 mg by mouth 2 (two) times daily with a meal.   Yes Historical Provider, MD  chlorzoxazone (PARAFON) 500 MG tablet Take 1 tablet (500 mg total) by mouth 3 (three) times daily as needed for muscle spasms. 06/02/13  Yes Stacie Glaze, MD  colchicine 0.6 MG tablet Take 0.6 mg by mouth 2 (two) times daily.   Yes Historical Provider, MD  furosemide (LASIX) 80 MG tablet Take 40-80 mg by mouth 2 (two) times daily. Takes 80 mg in am and 40 mg in pm   Yes Historical Provider, MD  glyBURIDE (DIABETA)  5 MG tablet Take 5 mg by mouth 2 (two) times daily with a meal.   Yes Historical Provider, MD  hydrALAZINE (APRESOLINE) 50 MG tablet Take 100 mg by mouth 3 (three) times daily.  04/13/11  Yes [name], MD  HYDROcodone-acetaminophen (VICODIN) 5-500 MG per tablet Take 1 tablet by mouth every 6 (six) hours as needed for pain.   Yes Historical Provider, MD  isosorbide mononitrate (IMDUR) 60 MG 24 hr tablet Take 60 mg by mouth daily.  06/09/13  Yes Stacie Glaze, MD  levothyroxine (SYNTHROID) 200 MCG tablet Take 1 tablet (200 mcg total) by mouth daily. 06/09/13  Yes Stacie Glaze, MD  magnesium 30 MG tablet Take 30 mg by mouth 3 times/day as needed-between meals & bedtime.    Yes Historical Provider, MD  nitroGLYCERIN (NITROSTAT) 0.4 MG SL tablet Place 1  tablet (0.4 mg total) under the tongue every 5 (five) minutes as needed for chest pain. 06/24/12 09/05/13 Yes Stacie Glaze, MD  olopatadine (PATANOL) 0.1 % ophthalmic solution Place 1 drop into both eyes 2 (two) times daily. 06/02/13  Yes Stacie Glaze, MD  rosuvastatin (CRESTOR) 20 MG tablet Take 20 mg by mouth daily.     Yes Historical Provider, MD     History   Social History  . Marital Status: Married    Spouse Name: N/A    Number of Children: N/A  . Years of Education: N/A   Occupational History  . Not on file.   Social History Main Topics  . Smoking status: Former Smoker -- 0.50 packs/day    Types: Cigarettes    Quit date: 10/16/1953  . Smokeless tobacco: Not on file     Comment: quit in the 70's  . Alcohol Use: No  . Drug Use: No  . Sexual Activity: Not Currently   Other Topics Concern  . Not on file   Social History Narrative   Lives in [place] with his wife and has 6 kids.     Family Status  Relation Status Death Age  . Mother Deceased   . Father Deceased    Family History  Problem Relation Age of Onset  . Kidney failure Mother   . Diabetes Mother   . Cancer Father     lung  . Heart attack Brother     massive heart attack in 47s  . Hypertension Mother      ROS:  Full 14 point review of systems complete and found to be negative unless listed above.  Physical Exam: Blood pressure 153/71, pulse 62, temperature 97.6 F (36.4 C), resp. rate 16, SpO2 96.00%.  Constitutional: He is oriented to person, place, and time. He appears well-developed and well-nourished. No distress.  Head: Normocephalic and atraumatic.  Nose: Nose normal.  Eyes: Conjunctivae are normal.  Neck: Neck supple.  Cardiovascular: Normal rate, regular rhythm, normal heart sounds and intact distal pulses. No JVD Pulmonary/Chest: CTAB  Abdominal: Soft. Bowel sounds are normal. There is no tenderness.  Musculoskeletal: No edema. He exhibits no tenderness.  Neurological: He is alert  and oriented to person, place, and time.  Skin: Skin is warm and dry. He is not diaphoretic.    Labs:   Lab Results  Component Value Date   WBC 4.1 09/05/2013   HGB 10.8* 09/05/2013   HCT 33.0* 09/05/2013   MCV 93.2 09/05/2013   PLT 118* 09/05/2013      Recent Labs Lab 09/05/13 0951  NA 140  K 4.2  CL 101  CO2 26  BUN 64*  CREATININE 4.26*  CALCIUM 9.6  PROT 7.3  BILITOT 0.4  ALKPHOS 32*  ALT 8  AST 12  GLUCOSE 183*  ALBUMIN 3.9    Recent Labs  09/05/13 1013 09/05/13 1318  TROPIPOC 0.00 0.00    Echo:  Echocardiogram in March of 2012 showed an ejection fraction of 35%, mild mitral regurgitation, mildly dilated aortic root, mild right atrial and right ventricular enlargement  ECG: Sinus rhythm with 1st degree A-V block with Premature atrial complexes Possible Anterior infarct , age undetermined Abnormal ECG No significant change since last tracing  Radiology:  Dg Chest 2 View  09/05/2013   CLINICAL DATA:  Chest pain.  EXAM: CHEST  2 VIEW  COMPARISON:  Chest x-ray dated 12/23/2010 and 12/15/2010  FINDINGS: There is chronic cardiomegaly. Pulmonary vascularity is at the upper limits of normal. No infiltrates or effusions. Chronic accentuation of the interstitial markings. No acute osseous abnormality.  IMPRESSION: No acute abnormality.  Chronic cardiomegaly.   Electronically Signed   By: Geanie Cooley M.D.   On: 09/05/2013 10:23    ASSESSMENT AND PLAN:    Principal Problem:   Chest pain Active Problems:   HYPOTHYROIDISM   Type II or unspecified type diabetes mellitus with renal manifestations, uncontrolled(250.42)   HYPERLIPIDEMIA   HYPERTENSION   CORONARY ARTERY DISEASE   Chronic renal disease, stage III     [name] [name] is a 77 year old male with a history of CAD s/p PCI to RCA in 2005, HTN, HLD, systolic CHF, DM, stage 4 CKD, CVA and hypothyroidism who presented to the ED for chest pain.   #1 Chest Pain:  - Serial troponin negative - No acute  ST-TW changes in EKG - Patient currently pain free - On good medical therapy- continue BB, statin, nitrates, aspirin  - Continue to monitor, have scheduled a Lexiscan Myoview in the morning, cardiac cath not an option currently with kidney function   #2 CHF - Continnue Lasix and carvedilol - No ACE/ARB 2/2 CKD  #3 CKD  -Cr: 4.26 GFR: 14, baseline Cr ~ 3 and baseline GFR ~25 - Continue to monitor   #4 HTN -Continue amlodipine and hydralizine   #5 HLD:  -Continue statin   #6 DM -SSI per IM   #7 Hypothryroidism Continue synthroid per IM  SignedThereasa Parkin, PA-C 09/05/2013 4:47 PM   Patient examined chart reviewed.  Pain with some atypical features at rest Nitro seemed to help No pain today. Avoid cath with elevated Cr.  Was supposed to have myovue next month.  Will arrange for a.m.  ECG with no acute changes and enzymes negative. Last myovue in 2012 with inferior infarct Despite collaterals to IM no ischemia so should be ok to do in am and compare. On good medical Rx already.  Would have to be high risk myovue to consider cath considering degree of renal failure  Charlton Haws

## 2013-09-05 NOTE — Telephone Encounter (Signed)
Received call directly from [facility] patient c/o chest pain yesterday that radiated up to his neck and mild SOB today.  Patient reports that he took 2 NTG which resolved his pain.  Patient states last night he began to experience SOB and today he has chest tenderness.  Patient states at the time of the chest pain he was also dizzy.  Patient denies lower extremity swelling, cough.  States he does have some head stuffiness but does not think this is related.  I asked patient to check his BP and HR which are:  145/78, HR 67.  Patient states he is taking all medications as advised.  I advised patient that since Dr. [name] is in the office that I am going to talk with him in person for advice and will call patient back.  Patient verbalized understanding and agreement.

## 2013-09-06 ENCOUNTER — Other Ambulatory Visit (HOSPITAL_COMMUNITY): Payer: Medicare Other

## 2013-09-06 ENCOUNTER — Observation Stay (HOSPITAL_COMMUNITY): Payer: Medicare Other

## 2013-09-06 DIAGNOSIS — E785 Hyperlipidemia, unspecified: Secondary | ICD-10-CM | POA: Diagnosis not present

## 2013-09-06 DIAGNOSIS — M549 Dorsalgia, unspecified: Secondary | ICD-10-CM

## 2013-09-06 DIAGNOSIS — R079 Chest pain, unspecified: Secondary | ICD-10-CM | POA: Diagnosis not present

## 2013-09-06 DIAGNOSIS — I509 Heart failure, unspecified: Secondary | ICD-10-CM | POA: Diagnosis not present

## 2013-09-06 DIAGNOSIS — I2510 Atherosclerotic heart disease of native coronary artery without angina pectoris: Secondary | ICD-10-CM | POA: Diagnosis not present

## 2013-09-06 LAB — BASIC METABOLIC PANEL
BUN: 69 mg/dL — ABNORMAL HIGH (ref 6–23)
Creatinine, Ser: 4.12 mg/dL — ABNORMAL HIGH (ref 0.50–1.35)
GFR calc Af Amer: 15 mL/min — ABNORMAL LOW (ref >90)
GFR calc non Af Amer: 13 mL/min — ABNORMAL LOW (ref >90)
Potassium: 3.7 mEq/L (ref 3.5–5.1)

## 2013-09-06 LAB — LIPID PANEL
LDL Cholesterol: 51 mg/dL (ref 0–99)
Total CHOL/HDL Ratio: 2.9 RATIO
VLDL: 23 mg/dL (ref 0–40)

## 2013-09-06 LAB — GLUCOSE, CAPILLARY
Glucose-Capillary: 82 mg/dL (ref 70–99)
Glucose-Capillary: 176 mg/dL — ABNORMAL HIGH (ref 70–99)

## 2013-09-06 LAB — CBC
Hemoglobin: 10.9 g/dL — ABNORMAL LOW (ref 13.0–17.0)
MCHC: 32.9 g/dL (ref 30.0–36.0)
RDW: 13.5 % (ref 11.5–15.5)

## 2013-09-06 LAB — HEMOGLOBIN A1C
Hgb A1c MFr Bld: 6.6 % — ABNORMAL HIGH (ref <5.7)
Mean Plasma Glucose: 143 mg/dL — ABNORMAL HIGH (ref <117)

## 2013-09-06 MED ORDER — COLCHICINE 0.6 MG PO TABS: 0.3000 mg | ORAL_TABLET | Freq: Every day | ORAL | Status: DC

## 2013-09-06 MED ORDER — TECHNETIUM TC 99M SESTAMIBI - CARDIOLITE
30.0000 | Freq: Once | INTRAVENOUS | Status: AC | PRN
  Administered 2013-09-06: 30 via INTRAVENOUS

## 2013-09-06 MED ORDER — TECHNETIUM TC 99M SESTAMIBI - CARDIOLITE
10.0000 | Freq: Once | INTRAVENOUS | Status: AC | PRN
  Administered 2013-09-06: 09:00:00 10 via INTRAVENOUS

## 2013-09-06 MED ORDER — REGADENOSON 0.4 MG/5ML IV SOLN
INTRAVENOUS | Status: AC
  Filled 2013-09-06: qty 5

## 2013-09-06 NOTE — Discharge Summary (Signed)
Physician Discharge Summary  Patient ID: [name] MRN: [id number] DOB/AGE: [birth year] 77 y.o.  Admit date: 09/05/2013 Discharge date: 09/06/2013  Primary Care Physician:  Carrie Mew, MD  Discharge Diagnoses:   Chest pain - resolved  . HYPOTHYROIDISM . Type II or unspecified type diabetes mellitus with renal manifestations, uncontrolled(250.42) . HYPERLIPIDEMIA . HYPERTENSION . CORONARY ARTERY DISEASE . Chronic renal disease, stage III . CONGESTIVE HEART FAILURE  Consults:  Cardiology, Dr Eden Emms  Allergies:   Allergies  Allergen Reactions  . Tomato     Too much acid     Discharge Medications:   Medication List         acetaminophen 500 MG tablet  Commonly known as:  TYLENOL  Take 500 mg by mouth every 6 (six) hours as needed.     amLODipine 10 MG tablet  Commonly known as:  NORVASC  Take 10 mg by mouth daily.     aspirin 325 MG tablet  Take 325 mg by mouth daily.     calcitRIOL 0.25 MCG capsule  Commonly known as:  ROCALTROL  0.25 mcg. Take one cap by mouth on odd days and 2 caps on even days.     carvedilol 12.5 MG tablet  Commonly known as:  COREG  Take 12.5 mg by mouth 2 (two) times daily with a meal.     chlorzoxazone 500 MG tablet  Commonly known as:  PARAFON  Take 1 tablet (500 mg total) by mouth 3 (three) times daily as needed for muscle spasms.     colchicine 0.6 MG tablet  Take 0.5 tablets (0.3 mg total) by mouth daily.     furosemide 80 MG tablet  Commonly known as:  LASIX  Take 40-80 mg by mouth 2 (two) times daily. Takes 80 mg in am and 40 mg in pm     glyBURIDE 5 MG tablet  Commonly known as:  DIABETA  Take 5 mg by mouth 2 (two) times daily with a meal.     hydrALAZINE 50 MG tablet  Commonly known as:  APRESOLINE  Take 100 mg by mouth 3 (three) times daily.     HYDROcodone-acetaminophen 5-500 MG per tablet  Commonly known as:  VICODIN  Take 1 tablet by mouth every 6 (six) hours as needed for pain.     isosorbide  mononitrate 60 MG 24 hr tablet  Commonly known as:  IMDUR  Take 60 mg by mouth daily.     levothyroxine 200 MCG tablet  Commonly known as:  SYNTHROID  Take 1 tablet (200 mcg total) by mouth daily.     magnesium 30 MG tablet  Take 30 mg by mouth 3 times/day as needed-between meals & bedtime.     nitroGLYCERIN 0.4 MG SL tablet  Commonly known as:  NITROSTAT  Place 1 tablet (0.4 mg total) under the tongue every 5 (five) minutes as needed for chest pain.     olopatadine 0.1 % ophthalmic solution  Commonly known as:  PATANOL  Place 1 drop into both eyes 2 (two) times daily.     rosuvastatin 20 MG tablet  Commonly known as:  CRESTOR  Take 20 mg by mouth daily.         Brief H and P: For complete details please refer to admission H and P, but in brief [name] [name] is a 77 y.o. male with PMH of systolic HF, CAD, HTN, HLD, DM, stage 3-4 CKD and hypothyroidism; came to ED complaining of CP the night  prior to admission. Patient reprots 10/10 pain left side chest, started while at rest and radiated to left arm and neck; patient took 2 NTG and pain completely resolved. In the morning he contacted his cardiologist office and was instructed to come to ER for further evaluation and treatment. Currently CP free, denies fever, chills, cough, N/V or any other complaints. In ED troponin negative and EKG w/o acute ischemic changes.   Hospital Course:   Chest pain with history of CAD : Is currently chest pain-free, continue aspirin, beta blocker, statin, troponins remained negative. Cardiology was consulted and patient underwent nuclear medicine stress test which showed  no reversible or irreversible defects to suggest any ischemia or infarction, normal wall motion, EF 76%.   HYPOTHYROIDISM: continue synthroid   Type II or unspecified type diabetes mellitus with renal manifestations:  - A1C 6.6; continue glyburide  HYPERLIPIDEMIA: Lipid panel showed cholesterol 114, LDL 51, continue statins    HYPERTENSION: continue home medication   History of Systolic CONGESTIVE HEART FAILURE: last EF 35-40% on 2-D echo March 2014.  -currently compensated, continue current medication regimen, no ACE/ARB due to renal failure   Chronic renal disease, stage III: slightly worse than prior results.  - I decreased his colchicine to 0.3 mg daily    Day of Discharge BP 129/69  Pulse 77  Temp(Src) 100.1 F (37.8 C) (Oral)  Resp 16  SpO2 98%  Physical Exam: General: Alert and awake oriented x3 not in any acute distress. HEENT: anicteric sclera, pupils reactive to light and accommodation CVS: S1-S2 clear no murmur rubs or gallops Chest: clear to auscultation bilaterally, no wheezing rales or rhonchi Abdomen: soft nontender, nondistended, normal bowel sounds, no organomegaly Extremities: no cyanosis, clubbing or edema noted bilaterally Neuro: Cranial nerves II-XII intact, no focal neurological deficits   The results of significant diagnostics from this hospitalization (including imaging, microbiology, ancillary and laboratory) are listed below for reference.    LAB RESULTS: Basic Metabolic Panel:  Recent Labs Lab 09/05/13 0951 09/05/13 1920 09/06/13 0125  NA 140  --  141  K 4.2  --  3.7  CL 101  --  101  CO2 26  --  27  GLUCOSE 183*  --  130*  BUN 64*  --  69*  CREATININE 4.26* 4.10* 4.12*  CALCIUM 9.6  --  9.4   Liver Function Tests:  Recent Labs Lab 09/05/13 0951  AST 12  ALT 8  ALKPHOS 32*  BILITOT 0.4  PROT 7.3  ALBUMIN 3.9   No results found for this basename: LIPASE, AMYLASE,  in the last 168 hours No results found for this basename: AMMONIA,  in the last 168 hours CBC:  Recent Labs Lab 09/05/13 1920 09/06/13 0125  WBC 4.4 4.6  HGB 11.4* 10.9*  HCT 35.4* 33.1*  MCV 93.9 92.7  PLT 128* 124*   Cardiac Enzymes:  Recent Labs Lab 09/05/13 1920 09/06/13 0125  TROPONINI <0.30 <0.30   BNP: No components found with this basename: POCBNP,   CBG:  Recent Labs Lab 09/06/13 0809 09/06/13 1309  GLUCAP 82 176*    Significant Diagnostic Studies:  Dg Chest 2 View  09/05/2013   CLINICAL DATA:  Chest pain.  EXAM: CHEST  2 VIEW  COMPARISON:  Chest x-ray dated 12/23/2010 and 12/15/2010  FINDINGS: There is chronic cardiomegaly. Pulmonary vascularity is at the upper limits of normal. No infiltrates or effusions. Chronic accentuation of the interstitial markings. No acute osseous abnormality.  IMPRESSION: No acute abnormality.  Chronic  cardiomegaly.   Electronically Signed   By: Geanie Cooley M.D.   On: 09/05/2013 10:23   Nm Myocar Multi W/spect W/wall Motion / Ef  09/06/2013   EXAM: MYOCARDIAL IMAGING WITH SPECT (REST AND PHARMACOLOGIC-STRESS)  GATED LEFT VENTRICULAR WALL MOTION STUDY  LEFT VENTRICULAR EJECTION FRACTION  TECHNIQUE: Standard myocardial SPECT imaging was performed after resting intravenous injection of 10 mCi Tc-57m sestamibi. Subsequently, intravenous infusion of Lexiscan was performed under the supervision of the Cardiology staff. At peak effect of the drug, 30 mCi Tc-24m sestamibi was injected intravenously and standard myocardial SPECT imaging was performed. Quantitative gated imaging was also performed to evaluate left ventricular wall motion, and estimate left ventricular ejection fraction.  COMPARISON:  None.  FINDINGS: SPECT imaging demonstrates no reversible or irreversible defects to suggest ischemia or infarction.  Quantitative gated analysis shows normal wall motion.  The resting left ventricular ejection fraction is 76% with end-diastolic volume of 67 ml and end-systolic volume of 16 ml.  IMPRESSION: No evidence of ischemia or infarction  Ejection fraction 76%.   Electronically Signed   By: Charlett Nose M.D.   On: 09/06/2013 14:21      Disposition and Follow-up:     Discharge Orders   Future Appointments Provider Department Dept Phone   10/03/2013 3:45 PM Stacie Glaze, MD [facility]  [phone]   11/03/2013 8:00 AM [name], MD [facility] [phone]   Future Orders Complete By Expires   Diet - low sodium heart healthy  As directed    Increase activity slowly  As directed        DISPOSITION: Home    DISCHARGE FOLLOW-UP Follow-up Information   Follow up with Carrie Mew, MD. Schedule an appointment as soon as possible for a visit in 10 days.   Specialty:  Internal Medicine   Contact information:   [address] [place] [address] [phone]       Follow up with Olga Millers, MD. Schedule an appointment as soon as possible for a visit in 2 weeks. (For hospital followup)    Specialty:  Cardiology   Contact information:   [address] [place] [address] [phone]       Time spent on Discharge: 35 minutes  Signed:   Denece Shearer M.D. [facility] 09/06/2013, 2:37 PM Pager: [phone]

## 2013-09-06 NOTE — Progress Notes (Signed)
Patient ID: [name] [name]  male  WUJ:[id number]    DOB: 05-Jun-1936    DOA: 09/05/2013  PCP: Carrie Mew, MD  Assessment/Plan: Principal Problem:   Chest pain with history of CAD - Is currently chest pain-free, continue aspirin, beta blocker, statin, troponins negative so far - Cardiology consulted, nuclear medicine stress test today.   Active Problems:   HYPOTHYROIDISM: continue synthroid   Type II or unspecified type diabetes mellitus with renal manifestations:  - A1C 6.6; hold glyburide while inpatient. Continue SSI   HYPERLIPIDEMIA: Lipid panel showed cholesterol 114, LDL 51,  continue statins   HYPERTENSION: continue home medication   History of Systolic CONGESTIVE HEART FAILURE: last EF 35-40% on 2-D echo March 2014.  -currently compensated, continue current medication regimen  -no ACE/ARB due to renal failure   Chronic renal disease, stage III: slightly worse than prior results.  -close follow up of renal function   DVT Prophylaxis:  Code Status:  Disposition:  await stress test results     Subjective: Denies any chest pain, seen earlier this morning, awaiting stress test   Objective: Weight change:   Intake/Output Summary (Last 24 hours) at 09/06/13 1232 Last data filed at 09/06/13 0700  Gross per 24 hour  Intake      0 ml  Output      0 ml  Net      0 ml   Blood pressure 114/54, pulse 77, temperature 100.1 F (37.8 C), temperature source Oral, resp. rate 16, SpO2 98.00%.  Physical Exam: General: Alert and awake, oriented x3, not in any acute distress. CVS: S1-S2 clear, no murmur rubs or gallops Chest: clear to auscultation bilaterally, no wheezing, rales or rhonchi Abdomen: soft nontender, nondistended, normal bowel sounds  Extremities: no cyanosis, clubbing or edema noted bilaterally  Lab Results: Basic Metabolic Panel:  Recent Labs Lab 09/05/13 0951 09/05/13 1920 09/06/13 0125  NA 140  --  141  K 4.2  --  3.7  CL 101  --  101   CO2 26  --  27  GLUCOSE 183*  --  130*  BUN 64*  --  69*  CREATININE 4.26* 4.10* 4.12*  CALCIUM 9.6  --  9.4   Liver Function Tests:  Recent Labs Lab 09/05/13 0951  AST 12  ALT 8  ALKPHOS 32*  BILITOT 0.4  PROT 7.3  ALBUMIN 3.9   No results found for this basename: LIPASE, AMYLASE,  in the last 168 hours No results found for this basename: AMMONIA,  in the last 168 hours CBC:  Recent Labs Lab 09/05/13 1920 09/06/13 0125  WBC 4.4 4.6  HGB 11.4* 10.9*  HCT 35.4* 33.1*  MCV 93.9 92.7  PLT 128* 124*   Cardiac Enzymes:  Recent Labs Lab 09/05/13 1920 09/06/13 0125  TROPONINI <0.30 <0.30   BNP: No components found with this basename: POCBNP,  CBG:  Recent Labs Lab 09/05/13 1647 09/05/13 2058 09/06/13 0809  GLUCAP 99 220* 82     Micro Results: No results found for this or any previous visit (from the past 240 hour(s)).  Studies/Results: Dg Chest 2 View  09/05/2013   CLINICAL DATA:  Chest pain.  EXAM: CHEST  2 VIEW  COMPARISON:  Chest x-ray dated 12/23/2010 and 12/15/2010  FINDINGS: There is chronic cardiomegaly. Pulmonary vascularity is at the upper limits of normal. No infiltrates or effusions. Chronic accentuation of the interstitial markings. No acute osseous abnormality.  IMPRESSION: No acute abnormality.  Chronic cardiomegaly.   Electronically Signed  By: Geanie Cooley M.D.   On: 09/05/2013 10:23    Medications: Scheduled Meds: . amLODipine  10 mg Oral Daily  . aspirin  325 mg Oral Daily  . atorvastatin  40 mg Oral q1800  . [START ON 09/07/2013] calcitRIOL  0.25 mcg Oral Q M,W,F,Su-1800   And  . calcitRIOL  0.5 mcg Oral Q T,Th,Sat-1800  . carvedilol  12.5 mg Oral BID WC  . colchicine  0.3 mg Oral Daily  . furosemide  40 mg Oral Q2200   And  . furosemide  80 mg Oral Q breakfast  . heparin  5,000 Units Subcutaneous Q8H  . hydrALAZINE  100 mg Oral TID  . insulin aspart  0-5 Units Subcutaneous QHS  . insulin aspart  0-9 Units Subcutaneous TID  WC  . isosorbide mononitrate  60 mg Oral Daily  . levothyroxine  200 mcg Oral QAC breakfast  . olopatadine  1 drop Both Eyes BID  . regadenoson      . sodium chloride  3 mL Intravenous Q12H      LOS: 1 day   Ashaun Gaughan M.D. [facility] 09/06/2013, 12:32 PM Pager: [phone]  If 7PM-7AM, please contact night-coverage [email or web address] Password [email or web address]

## 2013-09-07 NOTE — ED Provider Notes (Signed)
Medical screening examination/treatment/procedure(s) were performed by non-physician practitioner and as supervising physician I was immediately available for consultation/collaboration.  EKG Interpretation    Date/Time:  Friday September 05 2013 09:46:15 EST Ventricular Rate:  72 PR Interval:  228 QRS Duration: 106 QT Interval:  420 QTC Calculation: 459 R Axis:   55 Text Interpretation:  Sinus rhythm with 1st degree A-V block with Premature atrial complexes Possible Anterior infarct , age undetermined Abnormal ECG No significant change since last tracing Confirmed by Coy Vandoren  MD, Mikael Skoda ([id number]) on 09/05/2013 12:20:31 PM              Audree Camel, MD 09/07/13 [phone]

## 2013-10-03 ENCOUNTER — Other Ambulatory Visit: Payer: Self-pay | Admitting: *Deleted

## 2013-10-03 ENCOUNTER — Encounter: Payer: Self-pay | Admitting: Internal Medicine

## 2013-10-03 ENCOUNTER — Ambulatory Visit (INDEPENDENT_AMBULATORY_CARE_PROVIDER_SITE_OTHER): Payer: Medicare Other | Admitting: Internal Medicine

## 2013-10-03 VITALS — BP 140/80 | HR 76 | Temp 98.2°F | Resp 16 | Ht 68.0 in | Wt 184.0 lb

## 2013-10-03 DIAGNOSIS — E039 Hypothyroidism, unspecified: Secondary | ICD-10-CM

## 2013-10-03 DIAGNOSIS — I10 Essential (primary) hypertension: Secondary | ICD-10-CM

## 2013-10-03 DIAGNOSIS — E1110 Type 2 diabetes mellitus with ketoacidosis without coma: Secondary | ICD-10-CM

## 2013-10-03 DIAGNOSIS — E1310 Other specified diabetes mellitus with ketoacidosis without coma: Secondary | ICD-10-CM

## 2013-10-03 LAB — BASIC METABOLIC PANEL
CO2: 29 mEq/L (ref 19–32)
Calcium: 9.5 mg/dL (ref 8.4–10.5)
Chloride: 98 mEq/L (ref 96–112)
Creat: 4.95 mg/dL — ABNORMAL HIGH (ref 0.50–1.35)
Sodium: 137 mEq/L (ref 135–145)

## 2013-10-03 MED ORDER — HYDROCODONE-ACETAMINOPHEN 5-500 MG PO TABS: 1.0000 | ORAL_TABLET | Freq: Four times a day (QID) | ORAL | Status: DC | PRN

## 2013-10-03 NOTE — Patient Instructions (Signed)
The patient is instructed to continue all medications as prescribed. Schedule followup with check out clerk upon leaving the clinic  

## 2013-10-03 NOTE — Addendum Note (Signed)
Addended by: Rita Ohara R on: 10/03/2013 04:17 PM   Modules accepted: Orders

## 2013-10-03 NOTE — Progress Notes (Signed)
Pre visit review using our clinic review tool, if applicable. No additional management support is needed unless otherwise documented below in the visit note. 

## 2013-10-03 NOTE — Progress Notes (Signed)
Subjective:    Patient ID: [name] [name], male    DOB: [birth year], 77 y.o.   MRN: [id number]  HPI Follow up for DM and has noted good CBGs in the 90s and onluy one in the 120 range HTN stable No chest pian Monitoring for renal disease Monitoring for thyroid replacement therapy   Review of Systems  Constitutional: Negative for fever and fatigue.  HENT: Negative for congestion, hearing loss and postnasal drip.   Eyes: Negative for discharge, redness and visual disturbance.  Respiratory: Negative for cough, shortness of breath and wheezing.   Cardiovascular: Negative for leg swelling.  Gastrointestinal: Negative for abdominal pain, constipation and abdominal distention.  Genitourinary: Negative for urgency and frequency.  Musculoskeletal: Positive for gait problem and joint swelling. Negative for arthralgias and neck pain.  Skin: Negative for color change and rash.  Neurological: Positive for weakness. Negative for light-headedness.  Hematological: Negative for adenopathy.  Psychiatric/Behavioral: Negative for behavioral problems. The patient is nervous/anxious.    Past Medical History  Diagnosis Date  . CAD (coronary artery disease)     s/p PCI to mid and mid-distal RCA, 40% sten L main, no sig dz LCx   . HTN (hypertension)   . Hyperlipidemia   . Cerebrovascular accident   . CHF (congestive heart failure)     EF 35% echo 2012 ; EF 44% myoview 2012  . Diabetes mellitus   . 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 Unspecified hypothyroidism   . Gout     History   Social History  . Marital Status: Married    Spouse Name: N/A    Number of Children: N/A  . Years of Education: N/A   Occupational History  . Not on file.   Social History Main Topics  . Smoking status: Former Smoker -- 0.50 packs/day    Types: Cigarettes    Quit date: 10/16/1953  . Smokeless tobacco: Not on file     Comment: quit in the 70's  . Alcohol Use: No  . Drug Use: No  . Sexual  Activity: Not Currently   Other Topics Concern  . Not on file   Social History Narrative   Lives in [place] with his wife and has 6 kids.     Past Surgical History  Procedure Laterality Date  . Thyroidectomy      Family History  Problem Relation Age of Onset  . Kidney failure Mother   . Diabetes Mother   . Cancer Father     lung  . Heart attack Brother     massive heart attack in 61s  . Hypertension Mother     Allergies  Allergen Reactions  . Tomato     Too much acid    Current Outpatient Prescriptions on File Prior to Visit  Medication Sig Dispense Refill  . acetaminophen (TYLENOL) 500 MG tablet Take 500 mg by mouth every 6 (six) hours as needed.      Marland Kitchen amLODipine (NORVASC) 10 MG tablet Take 10 mg by mouth daily.      Marland Kitchen aspirin 325 MG tablet Take 325 mg by mouth daily.        . calcitRIOL (ROCALTROL) 0.25 MCG capsule 0.25 mcg. Take one cap by mouth on odd days and 2 caps on even days.       . carvedilol (COREG) 12.5 MG tablet Take 12.5 mg by mouth 2 (two) times daily with a meal.      .  chlorzoxazone (PARAFON) 500 MG tablet Take 1 tablet (500 mg total) by mouth 3 (three) times daily as needed for muscle spasms.  60 tablet  1  . colchicine 0.6 MG tablet Take 0.5 tablets (0.3 mg total) by mouth daily.      . furosemide (LASIX) 80 MG tablet Take 40-80 mg by mouth 2 (two) times daily. Takes 80 mg in am and 40 mg in pm      . glyBURIDE (DIABETA) 5 MG tablet Take 5 mg by mouth 2 (two) times daily with a meal.      . hydrALAZINE (APRESOLINE) 50 MG tablet Take 100 mg by mouth 3 (three) times daily.       Marland Kitchen HYDROcodone-acetaminophen (VICODIN) 5-500 MG per tablet Take 1 tablet by mouth every 6 (six) hours as needed for pain.      . isosorbide mononitrate (IMDUR) 60 MG 24 hr tablet Take 60 mg by mouth daily.       Marland Kitchen levothyroxine (SYNTHROID) 200 MCG tablet Take 1 tablet (200 mcg total) by mouth daily.  90 tablet  3  . magnesium 30 MG tablet Take 30 mg by mouth 2 (two) times  daily as needed (cramping in legs).       Marland Kitchen olopatadine (PATANOL) 0.1 % ophthalmic solution Place 1 drop into both eyes 2 (two) times daily.      . rosuvastatin (CRESTOR) 20 MG tablet Take 20 mg by mouth daily.        . nitroGLYCERIN (NITROSTAT) 0.4 MG SL tablet Place 1 tablet (0.4 mg total) under the tongue every 5 (five) minutes as needed for chest pain.  25 tablet  12   No current facility-administered medications on file prior to visit.    BP 140/80  Pulse 76  Temp(Src) 98.2 F (36.8 C)  Resp 16  Ht 5\' 8"  (1.727 m)  Wt 184 lb (83.462 kg)  BMI 27.98 kg/m2       Objective:   Physical Exam  Vitals reviewed. Constitutional: He appears well-developed and well-nourished.  HENT:  Head: Normocephalic and atraumatic.  Eyes: Conjunctivae are normal. Pupils are equal, round, and reactive to light.  Neck: Normal range of motion. Neck supple.  Cardiovascular: Normal rate and regular rhythm.   Pulmonary/Chest: Effort normal and breath sounds normal.  Abdominal: Soft. Bowel sounds are normal.  Musculoskeletal: He exhibits tenderness.  Right knee          Assessment & Plan:  Stable HTN Monitoring for renal disease and  HTN with bmet Monitor for thyroid replacement

## 2013-10-04 LAB — HEMOGLOBIN A1C: Mean Plasma Glucose: 140 mg/dL — ABNORMAL HIGH (ref <117)

## 2013-10-17 ENCOUNTER — Encounter: Payer: Self-pay | Admitting: Internal Medicine

## 2013-11-03 ENCOUNTER — Ambulatory Visit: Payer: Medicare Other | Admitting: Cardiology

## 2013-11-14 ENCOUNTER — Ambulatory Visit (INDEPENDENT_AMBULATORY_CARE_PROVIDER_SITE_OTHER): Payer: Medicare Other | Admitting: Cardiology

## 2013-11-14 ENCOUNTER — Encounter: Payer: Self-pay | Admitting: Cardiology

## 2013-11-14 VITALS — BP 122/68 | HR 64 | Ht 69.0 in | Wt 184.4 lb

## 2013-11-14 DIAGNOSIS — N183 Chronic kidney disease, stage 3 unspecified: Secondary | ICD-10-CM

## 2013-11-14 DIAGNOSIS — I2510 Atherosclerotic heart disease of native coronary artery without angina pectoris: Secondary | ICD-10-CM

## 2013-11-14 DIAGNOSIS — E785 Hyperlipidemia, unspecified: Secondary | ICD-10-CM

## 2013-11-14 DIAGNOSIS — I255 Ischemic cardiomyopathy: Secondary | ICD-10-CM

## 2013-11-14 DIAGNOSIS — I10 Essential (primary) hypertension: Secondary | ICD-10-CM

## 2013-11-14 DIAGNOSIS — I2589 Other forms of chronic ischemic heart disease: Secondary | ICD-10-CM

## 2013-11-14 NOTE — Assessment & Plan Note (Signed)
Continue statin. 

## 2013-11-14 NOTE — Assessment & Plan Note (Signed)
Continue beta blocker, hydralazine and nitrates. Not on an ACE inhibitor because of renal insufficiency.

## 2013-11-14 NOTE — Assessment & Plan Note (Signed)
Followed by nephrology. 

## 2013-11-14 NOTE — Assessment & Plan Note (Signed)
Blood pressure controlled. Continue present medications. 

## 2013-11-14 NOTE — Assessment & Plan Note (Signed)
Continue aspirin and statin. 

## 2013-11-14 NOTE — Patient Instructions (Signed)
Your physician wants you to follow-up in: ONE YEAR WITH DR CRENSHAW You will receive a reminder letter in the mail two months in advance. If you don't receive a letter, please call our office to schedule the follow-up appointment.  

## 2013-11-14 NOTE — Progress Notes (Signed)
HPI: FU coronary artery disease. His most recent catheterization was in March 2005 and showed a 40% left main, no significant disease in the circumflex, total occlusion of the ramus branch with a 50% first marginal, 40% proximal, followed by 95% mid, and 90% mid distal stenosis in the right coronary artery. The patient had successful PCI of the right coronary artery both the mid and mid-to- distal right lesions. Echocardiogram in March of 2012 showed an ejection fraction of 35%, mild mitral regurgitation, mildly dilated aortic root, mild right atrial and right ventricular enlargement. Patient admitted in November of 2014 with chest pain. Nuclear study in November of 2014 showed no ischemia or infarction and an ejection fraction of 76%. TSH 0.132. Since then, the patient denies any dyspnea on exertion, orthopnea, PND, pedal edema, palpitations, syncope or chest pain.    Current Outpatient Prescriptions  Medication Sig Dispense Refill  . acetaminophen (TYLENOL) 500 MG tablet Take 500 mg by mouth every 6 (six) hours as needed.      Marland Kitchen. amLODipine (NORVASC) 10 MG tablet Take 10 mg by mouth daily.      Marland Kitchen. aspirin 325 MG tablet Take 325 mg by mouth daily.        . calcitRIOL (ROCALTROL) 0.25 MCG capsule 0.25 mcg. Take one cap by mouth on odd days and 2 caps on even days.       . carvedilol (COREG) 12.5 MG tablet Take 12.5 mg by mouth 2 (two) times daily with a meal.      . chlorzoxazone (PARAFON) 500 MG tablet Take 1 tablet (500 mg total) by mouth 3 (three) times daily as needed for muscle spasms.  60 tablet  1  . colchicine 0.6 MG tablet Take 0.5 tablets (0.3 mg total) by mouth daily.      . furosemide (LASIX) 80 MG tablet Take 40-80 mg by mouth 2 (two) times daily. Takes 80 mg in am and 40 mg in pm      . glyBURIDE (DIABETA) 5 MG tablet Take 5 mg by mouth 2 (two) times daily with a meal.      . hydrALAZINE (APRESOLINE) 50 MG tablet Take 100 mg by mouth 3 (three) times daily.       Marland Kitchen.  HYDROcodone-acetaminophen (VICODIN) 5-500 MG per tablet Take 1 tablet by mouth every 6 (six) hours as needed for pain.  30 tablet  0  . isosorbide mononitrate (IMDUR) 60 MG 24 hr tablet Take 60 mg by mouth daily.       Marland Kitchen. levothyroxine (SYNTHROID) 200 MCG tablet Take 1 tablet (200 mcg total) by mouth daily.  90 tablet  3  . magnesium 30 MG tablet Take 30 mg by mouth 2 (two) times daily as needed (cramping in legs).       . meloxicam (MOBIC) 7.5 MG tablet Take 7.5 mg by mouth daily.       Marland Kitchen. olopatadine (PATANOL) 0.1 % ophthalmic solution Place 1 drop into both eyes 2 (two) times daily.      . rosuvastatin (CRESTOR) 20 MG tablet Take 20 mg by mouth daily.        . nitroGLYCERIN (NITROSTAT) 0.4 MG SL tablet Place 1 tablet (0.4 mg total) under the tongue every 5 (five) minutes as needed for chest pain.  25 tablet  12   No current facility-administered medications for this visit.     Past Medical History  Diagnosis Date  . CAD (coronary artery disease)     s/p  PCI to mid and mid-distal RCA, 40% sten L main, no sig dz LCx   . HTN (hypertension)   . Hyperlipidemia   . Cerebrovascular accident   . CHF (congestive heart failure)     EF 35% echo 2012 ; EF 44% myoview 2012  . Diabetes mellitus   . 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 Unspecified hypothyroidism   . Gout     Past Surgical History  Procedure Laterality Date  . Thyroidectomy      History   Social History  . Marital Status: Married    Spouse Name: N/A    Number of Children: N/A  . Years of Education: N/A   Occupational History  . Not on file.   Social History Main Topics  . Smoking status: Former Smoker -- 0.50 packs/day    Types: Cigarettes    Quit date: 10/16/1953  . Smokeless tobacco: Not on file     Comment: quit in the 70's  . Alcohol Use: No  . Drug Use: No  . Sexual Activity: Not Currently   Other Topics Concern  . Not on file   Social History Narrative   Lives in [place] with his wife  and has 6 kids.     ROS: no fevers or chills, productive cough, hemoptysis, dysphasia, odynophagia, melena, hematochezia, dysuria, hematuria, rash, seizure activity, orthopnea, PND, pedal edema, claudication. Remaining systems are negative.  Physical Exam: Well-developed well-nourished in no acute distress.  Skin is warm and dry.  HEENT is normal.  Neck is supple.  Chest is clear to auscultation with normal expansion.  Cardiovascular exam is regular rate and rhythm.  Abdominal exam nontender or distended. No masses palpated. Extremities show no edema. neuro grossly intact

## 2013-12-20 ENCOUNTER — Other Ambulatory Visit: Payer: Self-pay | Admitting: Internal Medicine

## 2013-12-26 ENCOUNTER — Other Ambulatory Visit: Payer: Self-pay | Admitting: Internal Medicine

## 2014-02-05 ENCOUNTER — Other Ambulatory Visit: Payer: Self-pay | Admitting: Internal Medicine

## 2014-02-05 NOTE — Telephone Encounter (Signed)
Spoke to the pharmacy.  Pt has prescriptions on file.  Asked them to get a prescription ready for the pt.

## 2014-02-16 ENCOUNTER — Encounter: Payer: Self-pay | Admitting: Internal Medicine

## 2014-02-16 ENCOUNTER — Ambulatory Visit (INDEPENDENT_AMBULATORY_CARE_PROVIDER_SITE_OTHER): Payer: Medicare Other | Admitting: Internal Medicine

## 2014-02-16 ENCOUNTER — Telehealth: Payer: Self-pay

## 2014-02-16 VITALS — BP 120/68 | HR 65 | Temp 97.9°F | Wt 187.0 lb

## 2014-02-16 DIAGNOSIS — I10 Essential (primary) hypertension: Secondary | ICD-10-CM

## 2014-02-16 DIAGNOSIS — E1129 Type 2 diabetes mellitus with other diabetic kidney complication: Secondary | ICD-10-CM

## 2014-02-16 DIAGNOSIS — E1165 Type 2 diabetes mellitus with hyperglycemia: Secondary | ICD-10-CM

## 2014-02-16 DIAGNOSIS — I2510 Atherosclerotic heart disease of native coronary artery without angina pectoris: Secondary | ICD-10-CM

## 2014-02-16 DIAGNOSIS — E039 Hypothyroidism, unspecified: Secondary | ICD-10-CM

## 2014-02-16 LAB — BASIC METABOLIC PANEL
BUN: 74 mg/dL — ABNORMAL HIGH (ref 6–23)
CALCIUM: 9.6 mg/dL (ref 8.4–10.5)
CO2: 26 meq/L (ref 19–32)
CREATININE: 4.7 mg/dL — AB (ref 0.4–1.5)
Chloride: 105 mEq/L (ref 96–112)
GFR: 15.77 mL/min — ABNORMAL LOW (ref >60.00)
Glucose, Bld: 109 mg/dL — ABNORMAL HIGH (ref 70–99)
Potassium: 5 mEq/L (ref 3.5–5.1)
Sodium: 140 mEq/L (ref 135–145)

## 2014-02-16 LAB — HEMOGLOBIN A1C: Hgb A1c MFr Bld: 6.4 % (ref 4.6–6.5)

## 2014-02-16 MED ORDER — HYDROCODONE-ACETAMINOPHEN 5-325 MG PO TABS: 1.0000 | ORAL_TABLET | Freq: Four times a day (QID) | ORAL | Status: DC | PRN

## 2014-02-16 MED ORDER — CARVEDILOL 12.5 MG PO TABS: 12.5000 mg | ORAL_TABLET | Freq: Two times a day (BID) | ORAL | Status: DC

## 2014-02-16 NOTE — Progress Notes (Signed)
Subjective:    Patient ID: [name] [name], male    DOB: 10/27/1935, 78 y.o.   MRN: [id number]  HPI  Has been sad about change and new MD Blood pressure stable CBGs are between 80 and 124 with avergage 89! Last A1c  Was 6.5 Stable CAD  following Renal dz stage 3  Review of Systems  Constitutional: Positive for fatigue. Negative for fever.  HENT: Positive for hearing loss. Negative for congestion and postnasal drip.   Eyes: Negative for discharge, redness and visual disturbance.  Respiratory: Positive for chest tightness. Negative for cough, shortness of breath and wheezing.   Cardiovascular: Negative for leg swelling.  Gastrointestinal: Negative for abdominal pain, constipation and abdominal distention.  Genitourinary: Positive for urgency. Negative for frequency.  Musculoskeletal: Positive for arthralgias. Negative for joint swelling and neck pain.  Skin: Negative for color change and rash.  Neurological: Negative for weakness and light-headedness.  Hematological: Negative for adenopathy.  Psychiatric/Behavioral: Negative for behavioral problems.       Past Medical History  Diagnosis Date  . CAD (coronary artery disease)     s/p PCI to mid and mid-distal RCA, 40% sten L main, no sig dz LCx   . HTN (hypertension)   . Hyperlipidemia   . Cerebrovascular accident   . CHF (congestive heart failure)     EF 35% echo 2012 ; EF 44% myoview 2012  . Diabetes mellitus   . 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 Unspecified hypothyroidism   . Gout     History   Social History  . Marital Status: Married    Spouse Name: N/A    Number of Children: N/A  . Years of Education: N/A   Occupational History  . Not on file.   Social History Main Topics  . Smoking status: Former Smoker -- 0.50 packs/day    Types: Cigarettes    Quit date: 10/16/1953  . Smokeless tobacco: Not on file     Comment: quit in the 70's  . Alcohol Use: No  . Drug Use: No  . Sexual Activity:  Not Currently   Other Topics Concern  . Not on file   Social History Narrative   Lives in [place] with his wife and has 6 kids.     Past Surgical History  Procedure Laterality Date  . Thyroidectomy      Family History  Problem Relation Age of Onset  . Kidney failure Mother   . Diabetes Mother   . Cancer Father     lung  . Heart attack Brother     massive heart attack in 9050s  . Hypertension Mother     Allergies  Allergen Reactions  . Tomato     Too much acid    Current Outpatient Prescriptions on File Prior to Visit  Medication Sig Dispense Refill  . acetaminophen (TYLENOL) 500 MG tablet Take 500 mg by mouth every 6 (six) hours as needed.      Marland Kitchen. amLODipine (NORVASC) 10 MG tablet Take 10 mg by mouth daily.      Marland Kitchen. aspirin 325 MG tablet Take 325 mg by mouth daily.        . calcitRIOL (ROCALTROL) 0.25 MCG capsule 0.25 mcg. Take one cap by mouth on odd days and 2 caps on even days.       . carvedilol (COREG) 12.5 MG tablet Take 12.5 mg by mouth 2 (two) times daily with a meal.      .  chlorzoxazone (PARAFON) 500 MG tablet Take 1 tablet (500 mg total) by mouth 3 (three) times daily as needed for muscle spasms.  60 tablet  1  . colchicine 0.6 MG tablet Take 0.5 tablets (0.3 mg total) by mouth daily.      . furosemide (LASIX) 80 MG tablet TAKE 1 TABLET BY MOUTH IN THE MORNING AND 1/2 TABLET AT NIGHT  60 tablet  6  . glyBURIDE (DIABETA) 5 MG tablet Take 5 mg by mouth 2 (two) times daily with a meal.      . hydrALAZINE (APRESOLINE) 50 MG tablet Take 100 mg by mouth 3 (three) times daily.       Marland Kitchen. HYDROcodone-acetaminophen (VICODIN) 5-500 MG per tablet Take 1 tablet by mouth every 6 (six) hours as needed for pain.  30 tablet  0  . isosorbide mononitrate (IMDUR) 60 MG 24 hr tablet Take 60 mg by mouth daily.       Marland Kitchen. levothyroxine (SYNTHROID) 200 MCG tablet Take 1 tablet (200 mcg total) by mouth daily.  90 tablet  3  . magnesium 30 MG tablet Take 30 mg by mouth 2 (two) times daily as  needed (cramping in legs).       . meloxicam (MOBIC) 7.5 MG tablet TAKE 1 TABLET (7.5 MG TOTAL) BY MOUTH DAILY.  30 tablet  3  . olopatadine (PATANOL) 0.1 % ophthalmic solution Place 1 drop into both eyes 2 (two) times daily.      . rosuvastatin (CRESTOR) 20 MG tablet Take 20 mg by mouth daily.        . nitroGLYCERIN (NITROSTAT) 0.4 MG SL tablet Place 1 tablet (0.4 mg total) under the tongue every 5 (five) minutes as needed for chest pain.  25 tablet  12   No current facility-administered medications on file prior to visit.    BP 120/68  Pulse 65  Temp(Src) 97.9 F (36.6 C) (Oral)  Wt 187 lb (84.823 kg)  SpO2 97%    Objective:   Physical Exam  Nursing note and vitals reviewed. Constitutional: He appears well-developed and well-nourished.  HENT:  Head: Normocephalic and atraumatic.  Eyes: Conjunctivae are normal. Pupils are equal, round, and reactive to light.  Neck: Normal range of motion. Neck supple.  Cardiovascular: Normal rate and regular rhythm.   Murmur heard. Pulmonary/Chest: Breath sounds normal.          Assessment & Plan:  Stable HTN and DM Monitor a1c and bmet follow at [facility] for renal failure Follow up with Dr Durene CalHunter in 4-5 months for DM check

## 2014-02-16 NOTE — Patient Instructions (Signed)
The patient is instructed to continue all medications as prescribed. Schedule followup with check out clerk upon leaving the clinic  

## 2014-02-16 NOTE — Progress Notes (Signed)
Pre visit review using our clinic review tool, if applicable. No additional management support is needed unless otherwise documented below in the visit note. 

## 2014-02-16 NOTE — Telephone Encounter (Signed)
Creatinine is 4.67.  Per Dr. Artist PaisYoo call pt and have him stop meloxicam and any other NSAIDS.  Pt needs to follow up with nephrology ( Dr. Briant CedarMattingly as well. Called and spoke with pt and pt is aware of recommendations.

## 2014-02-17 ENCOUNTER — Telehealth: Payer: Self-pay | Admitting: Internal Medicine

## 2014-02-17 NOTE — Telephone Encounter (Signed)
Relevant patient education mailed to patient.  

## 2014-02-24 ENCOUNTER — Telehealth: Payer: Self-pay

## 2014-02-24 NOTE — Telephone Encounter (Signed)
Relevant patient education mailed to patient.  

## 2014-02-25 ENCOUNTER — Encounter: Payer: Self-pay | Admitting: Family

## 2014-02-25 ENCOUNTER — Ambulatory Visit (INDEPENDENT_AMBULATORY_CARE_PROVIDER_SITE_OTHER): Payer: Medicare Other | Admitting: Family

## 2014-02-25 VITALS — BP 126/64 | HR 78 | Wt 187.0 lb

## 2014-02-25 DIAGNOSIS — M1710 Unilateral primary osteoarthritis, unspecified knee: Secondary | ICD-10-CM

## 2014-02-25 DIAGNOSIS — I2510 Atherosclerotic heart disease of native coronary artery without angina pectoris: Secondary | ICD-10-CM

## 2014-02-25 DIAGNOSIS — M1712 Unilateral primary osteoarthritis, left knee: Secondary | ICD-10-CM

## 2014-02-25 DIAGNOSIS — M1711 Unilateral primary osteoarthritis, right knee: Secondary | ICD-10-CM

## 2014-02-25 DIAGNOSIS — IMO0002 Reserved for concepts with insufficient information to code with codable children: Secondary | ICD-10-CM

## 2014-02-25 MED ORDER — METHYLPREDNISOLONE ACETATE 40 MG/ML IJ SUSP: 40.0000 mg | Freq: Once | INTRAMUSCULAR | Status: DC

## 2014-02-25 NOTE — Progress Notes (Signed)
Subjective:    Patient ID: [name] [name], male    DOB: 11/02/1935, 78 y.o.   MRN: [id number]  HPI 78 year old AAM, in requesting joint injection for his knees. Has a history of osteoarthritis of the knee. Injections have helped in the past.    Review of Systems  Constitutional: Negative.   Respiratory: Negative.   Cardiovascular: Negative.   Gastrointestinal: Negative.   Endocrine: Negative.   Musculoskeletal: Positive for arthralgias.       Bilateral knee pain   Skin: Negative.   Neurological: Negative.   Psychiatric/Behavioral: Negative.    Past Medical History  Diagnosis Date  . CAD (coronary artery disease)     s/p PCI to mid and mid-distal RCA, 40% sten L main, no sig dz LCx   . HTN (hypertension)   . Hyperlipidemia   . Cerebrovascular accident   . CHF (congestive heart failure)     EF 35% echo 2012 ; EF 44% myoview 2012  . Diabetes mellitus   . 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 Unspecified hypothyroidism   . Gout     History   Social History  . Marital Status: Married    Spouse Name: N/A    Number of Children: N/A  . Years of Education: N/A   Occupational History  . Not on file.   Social History Main Topics  . Smoking status: Former Smoker -- 0.50 packs/day    Types: Cigarettes    Quit date: 10/16/1953  . Smokeless tobacco: Not on file     Comment: quit in the 70's  . Alcohol Use: No  . Drug Use: No  . Sexual Activity: Not Currently   Other Topics Concern  . Not on file   Social History Narrative   Lives in [place] with his wife and has 6 kids.     Past Surgical History  Procedure Laterality Date  . Thyroidectomy      Family History  Problem Relation Age of Onset  . Kidney failure Mother   . Diabetes Mother   . Cancer Father     lung  . Heart attack Brother     massive heart attack in 6650s  . Hypertension Mother     Allergies  Allergen Reactions  . Tomato     Too much acid    Current Outpatient  Prescriptions on File Prior to Visit  Medication Sig Dispense Refill  . acetaminophen (TYLENOL) 500 MG tablet Take 500 mg by mouth every 6 (six) hours as needed.      Marland Kitchen. amLODipine (NORVASC) 10 MG tablet Take 10 mg by mouth daily.      Marland Kitchen. aspirin 325 MG tablet Take 325 mg by mouth daily.        . calcitRIOL (ROCALTROL) 0.25 MCG capsule 0.25 mcg. Take one cap by mouth on odd days and 2 caps on even days.       . carvedilol (COREG) 12.5 MG tablet Take 1 tablet (12.5 mg total) by mouth 2 (two) times daily with a meal.  60 tablet  11  . chlorzoxazone (PARAFON) 500 MG tablet Take 1 tablet (500 mg total) by mouth 3 (three) times daily as needed for muscle spasms.  60 tablet  1  . colchicine 0.6 MG tablet Take 0.5 tablets (0.3 mg total) by mouth daily.      . furosemide (LASIX) 80 MG tablet TAKE 1 TABLET BY MOUTH IN THE MORNING AND 1/2 TABLET  AT NIGHT  60 tablet  6  . glyBURIDE (DIABETA) 5 MG tablet Take 5 mg by mouth 2 (two) times daily with a meal.      . hydrALAZINE (APRESOLINE) 50 MG tablet Take 100 mg by mouth 3 (three) times daily.       Marland Kitchen. HYDROcodone-acetaminophen (NORCO/VICODIN) 5-325 MG per tablet Take 1 tablet by mouth every 6 (six) hours as needed for moderate pain.  60 tablet  0  . isosorbide mononitrate (IMDUR) 60 MG 24 hr tablet Take 60 mg by mouth daily.       Marland Kitchen. levothyroxine (SYNTHROID) 200 MCG tablet Take 1 tablet (200 mcg total) by mouth daily.  90 tablet  3  . magnesium 30 MG tablet Take 30 mg by mouth 2 (two) times daily as needed (cramping in legs).       . meloxicam (MOBIC) 7.5 MG tablet TAKE 1 TABLET (7.5 MG TOTAL) BY MOUTH DAILY.  30 tablet  3  . olopatadine (PATANOL) 0.1 % ophthalmic solution Place 1 drop into both eyes 2 (two) times daily.      . rosuvastatin (CRESTOR) 20 MG tablet Take 20 mg by mouth daily.        . nitroGLYCERIN (NITROSTAT) 0.4 MG SL tablet Place 1 tablet (0.4 mg total) under the tongue every 5 (five) minutes as needed for chest pain.  25 tablet  12   No  current facility-administered medications on file prior to visit.    BP 126/64  Pulse 78  Wt 187 lb (84.823 kg)  SpO2 92%chart    Objective:   Physical Exam  Constitutional: He is oriented to person, place, and time. He appears well-developed and well-nourished.  Cardiovascular: Normal rate, regular rhythm and normal heart sounds.   Pulmonary/Chest: Effort normal and breath sounds normal.  Musculoskeletal: He exhibits tenderness. He exhibits no edema.  Pain with flexion and extension of knees  Neurological: He is alert and oriented to person, place, and time.  Skin: Skin is warm and dry.  Psychiatric: He has a normal mood and affect.     Right knee Injection: Informed consent obtained and the patient's knee was prepped with betadine. Local anesthesia was obtained with topical spray. Then 40 mg of Depo-Medrol and 1/2 cc of lidocaine was injected into the joint space. The patient tolerated the procedure without complications. Post injection care discussed with patient. Repeated to left knee.      Assessment & Plan:  [name] [name] was seen today for no specified reason.  Diagnoses and associated orders for this visit:  Osteoarthritis of left knee - methylPREDNISolone acetate (DEPO-MEDROL) injection 40 mg; Inject 1 mL (40 mg total) into the articular space once.  Osteoarthritis of right knee - methylPREDNISolone acetate (DEPO-MEDROL) injection 40 mg; Inject 1 mL (40 mg total) into the articular space once.   Call the office with any questions or concerns. Recheck as scheduled and as needed.

## 2014-02-25 NOTE — Progress Notes (Signed)
Pre visit review using our clinic review tool, if applicable. No additional management support is needed unless otherwise documented below in the visit note. 

## 2014-02-25 NOTE — Patient Instructions (Signed)
Wear and Tear Disorders of the Knee (Arthritis, Osteoarthritis)  Everyone will experience wear and tear injuries (arthritis, osteoarthritis) of the knee. These are the changes we all get as we age. They come from the joint stress of daily living. The amount of cartilage damage in your knee and your symptoms determine if you need surgery. Mild problems require approximately two months recovery time. More severe problems take several months to recover. With mild problems, your surgeon may find worn and rough cartilage surfaces. With severe changes, your surgeon may find cartilage that has completely worn away and exposed the bone. Loose bodies of bone and cartilage, bone spurs (excess bone growth), and injuries to the menisci (cushions between the large bones of your leg) are also common. All of these problems can cause pain.  For a mild wear and tear problem, rough cartilage may simply need to be shaved and smoothed. For more severe problems with areas of exposed bone, your surgeon may use an instrument for roughing up the bone surfaces to stimulate new cartilage growth. Loose bodies are usually removed. Torn menisci may be trimmed or repaired.  ABOUT THE ARTHROSCOPIC PROCEDURE  Arthroscopy is a surgical technique. It allows your orthopedic surgeon to diagnose and treat your knee injury with accuracy. The surgeon looks into your knee through a small scope. The scope is like a small (pencil-sized) telescope. Arthroscopy is less invasive than open knee surgery. You can expect a more rapid recovery. After the procedure, you will be moved to a recovery area until most of the effects of the medication have worn off. Your caregiver will discuss the test results with you.  RECOVERY  The severity of the arthritis and the type of procedure performed will determine recovery time. Other important factors include age, physical condition, medical conditions, and the type of rehabilitation program. Strengthening your muscles after  arthroscopy helps guarantee a better recovery. Follow your caregiver's instructions. Use crutches, rest, elevate, ice, and do knee exercises as instructed. Your caregivers will help you and instruct you with exercises and other physical therapy required to regain your mobility, muscle strength, and functioning following surgery. Only take over-the-counter or prescription medicines for pain, discomfort, or fever as directed by your caregiver.   SEEK MEDICAL CARE IF:   · There is increased bleeding (more than a small spot) from the wound.  · You notice redness, swelling, or increasing pain in the wound.  · Pus is coming from wound.  · You develop an unexplained oral temperature above 102° F (38.9° C) , or as your caregiver suggests.  · You notice a foul smell coming from the wound or dressing.  · You have severe pain with motion of the knee.  SEEK IMMEDIATE MEDICAL CARE IF:   · You develop a rash.  · You have difficulty breathing.  · You have any allergic problems.  MAKE SURE YOU:   · Understand these instructions.  · Will watch your condition.  · Will get help right away if you are not doing well or get worse.  Document Released: 09/29/2000 Document Revised: 12/25/2011 Document Reviewed: 02/26/2008  [employer]® Patient Information ©2014 [employer].

## 2014-03-03 ENCOUNTER — Other Ambulatory Visit: Payer: Self-pay | Admitting: Internal Medicine

## 2014-03-10 ENCOUNTER — Telehealth: Payer: Self-pay | Admitting: Internal Medicine

## 2014-03-10 NOTE — Telephone Encounter (Signed)
[facility] following up on a back brace pt wanted. They would like to know if order has been signed? Will resend.

## 2014-03-11 NOTE — Telephone Encounter (Signed)
I have not documentation on a back brace. Attempted to call number provided and the line is consistently busy.

## 2014-03-12 NOTE — Telephone Encounter (Signed)
Per Oran Rein, pt has not discussed receiving a back brace with her. Pt was seen by [facility] for joint injections only

## 2014-03-12 NOTE — Telephone Encounter (Signed)
I have attempted to call number provided several times and it is busy. Unable to obtain sufficient information about back brace.  Will contact pt for further information

## 2014-03-31 ENCOUNTER — Telehealth: Payer: Self-pay | Admitting: Internal Medicine

## 2014-03-31 MED ORDER — LEVOTHYROXINE SODIUM 200 MCG PO TABS: 200.0000 ug | ORAL_TABLET | Freq: Every day | ORAL | Status: DC

## 2014-03-31 MED ORDER — OLOPATADINE HCL 0.1 % OP SOLN: 1.0000 [drp] | Freq: Two times a day (BID) | OPHTHALMIC | Status: DC

## 2014-03-31 MED ORDER — MELOXICAM 7.5 MG PO TABS: ORAL_TABLET | ORAL | Status: DC

## 2014-03-31 NOTE — Telephone Encounter (Signed)
Please add levothyroxine (SYNTHROID) 200 MCG tablet and meloxicam (MOBIC) 7.5 MG tablet to the 90 day re-fill request

## 2014-03-31 NOTE — Telephone Encounter (Signed)
All prescriptions sent to pharmacy  

## 2014-03-31 NOTE — Telephone Encounter (Signed)
[id number] - [place] - [address] is requesting 90 day re-fill on olopatadine (PATANOL) 0.1 % ophthalmic solution

## 2014-04-01 ENCOUNTER — Other Ambulatory Visit: Payer: Self-pay | Admitting: Internal Medicine

## 2014-04-01 ENCOUNTER — Ambulatory Visit (INDEPENDENT_AMBULATORY_CARE_PROVIDER_SITE_OTHER): Payer: Medicare Other | Admitting: Physician Assistant

## 2014-04-01 ENCOUNTER — Encounter: Payer: Self-pay | Admitting: Physician Assistant

## 2014-04-01 VITALS — BP 130/70 | HR 68 | Temp 97.8°F | Resp 18 | Wt 182.0 lb

## 2014-04-01 DIAGNOSIS — R42 Dizziness and giddiness: Secondary | ICD-10-CM

## 2014-04-01 DIAGNOSIS — J309 Allergic rhinitis, unspecified: Secondary | ICD-10-CM

## 2014-04-01 DIAGNOSIS — I2510 Atherosclerotic heart disease of native coronary artery without angina pectoris: Secondary | ICD-10-CM

## 2014-04-01 DIAGNOSIS — J302 Other seasonal allergic rhinitis: Secondary | ICD-10-CM

## 2014-04-01 LAB — BASIC METABOLIC PANEL
BUN: 92 mg/dL (ref 6–23)
CALCIUM: 9.4 mg/dL (ref 8.4–10.5)
CO2: 25 mEq/L (ref 19–32)
CREATININE: 4.2 mg/dL — AB (ref 0.4–1.5)
Chloride: 104 mEq/L (ref 96–112)
GFR: 17.72 mL/min — AB (ref >60.00)
Glucose, Bld: 176 mg/dL — ABNORMAL HIGH (ref 70–99)
Potassium: 4.6 mEq/L (ref 3.5–5.1)
SODIUM: 139 meq/L (ref 135–145)

## 2014-04-01 MED ORDER — AMLODIPINE BESYLATE 5 MG PO TABS: 5.0000 mg | ORAL_TABLET | Freq: Every day | ORAL | Status: DC

## 2014-04-01 NOTE — Progress Notes (Signed)
Subjective:    Patient ID: [name] [name], male    DOB: 11/24/1935, 78 y.o.   MRN: [id number]  Dizziness This is a new problem. The current episode started yesterday. The problem occurs 2 to 4 times per day. The problem has been unchanged. Associated symptoms include congestion and fatigue. Pertinent negatives include no abdominal pain, anorexia, arthralgias, change in bowel habit, chest pain, chills, coughing, diaphoresis, fever, headaches, joint swelling, myalgias, nausea, neck pain, numbness, rash, sore throat, swollen glands, urinary symptoms, vertigo, visual change, vomiting or weakness. The symptoms are aggravated by standing and bending. He has tried nothing for the symptoms.      Review of Systems  Constitutional: Positive for fatigue. Negative for fever, chills and diaphoresis.  HENT: Positive for congestion, postnasal drip and sinus pressure. Negative for ear discharge, ear pain and sore throat.   Eyes: Negative for visual disturbance.  Respiratory: Positive for shortness of breath. Negative for apnea, cough, chest tightness and wheezing.   Cardiovascular: Negative for chest pain, palpitations and leg swelling.  Gastrointestinal: Negative for nausea, vomiting, abdominal pain, diarrhea, anorexia and change in bowel habit.  Musculoskeletal: Negative for arthralgias, joint swelling, myalgias and neck pain.  Skin: Negative for rash.  Neurological: Positive for dizziness. Negative for vertigo, weakness, numbness and headaches.  All other systems reviewed and are negative.    Past Medical History  Diagnosis Date  . CAD (coronary artery disease)     s/p PCI to mid and mid-distal RCA, 40% sten L main, no sig dz LCx   . HTN (hypertension)   . Hyperlipidemia   . Cerebrovascular accident   . CHF (congestive heart failure)     EF 35% echo 2012 ; EF 44% myoview 2012  . Diabetes mellitus   . 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 Unspecified hypothyroidism   . Gout      History   Social History  . Marital Status: Married    Spouse Name: N/A    Number of Children: N/A  . Years of Education: N/A   Occupational History  . Not on file.   Social History Main Topics  . Smoking status: Former Smoker -- 0.50 packs/day    Types: Cigarettes    Quit date: 10/16/1953  . Smokeless tobacco: Not on file     Comment: quit in the 70's  . Alcohol Use: No  . Drug Use: No  . Sexual Activity: Not Currently   Other Topics Concern  . Not on file   Social History Narrative   Lives in [place] with his wife and has 6 kids.     Past Surgical History  Procedure Laterality Date  . Thyroidectomy      Family History  Problem Relation Age of Onset  . Kidney failure Mother   . Diabetes Mother   . Cancer Father     lung  . Heart attack Brother     massive heart attack in 8650s  . Hypertension Mother     Allergies  Allergen Reactions  . Tomato     Too much acid    Current Outpatient Prescriptions on File Prior to Visit  Medication Sig Dispense Refill  . acetaminophen (TYLENOL) 500 MG tablet Take 500 mg by mouth every 6 (six) hours as needed.      Marland Kitchen. aspirin 325 MG tablet Take 325 mg by mouth daily.        . calcitRIOL (ROCALTROL) 0.25 MCG capsule 0.25 mcg.  Take one cap by mouth on odd days and 2 caps on even days.       . carvedilol (COREG) 12.5 MG tablet Take 1 tablet (12.5 mg total) by mouth 2 (two) times daily with a meal.  60 tablet  11  . chlorzoxazone (PARAFON) 500 MG tablet Take 1 tablet (500 mg total) by mouth 3 (three) times daily as needed for muscle spasms.  60 tablet  1  . colchicine 0.6 MG tablet Take 0.5 tablets (0.3 mg total) by mouth daily.      . furosemide (LASIX) 80 MG tablet TAKE 1 TABLET BY MOUTH IN THE MORNING AND 1/2 TABLET AT NIGHT  60 tablet  6  . glyBURIDE (DIABETA) 5 MG tablet Take 5 mg by mouth 2 (two) times daily with a meal.      . hydrALAZINE (APRESOLINE) 50 MG tablet Take 100 mg by mouth 3 (three) times daily.       Marland Kitchen.  HYDROcodone-acetaminophen (NORCO/VICODIN) 5-325 MG per tablet Take 1 tablet by mouth every 6 (six) hours as needed for moderate pain.  60 tablet  0  . isosorbide mononitrate (IMDUR) 60 MG 24 hr tablet Take 60 mg by mouth daily.       . isosorbide mononitrate (IMDUR) 60 MG 24 hr tablet TAKE 1 TABLET BY MOUTH DAILY.  30 tablet  11  . levothyroxine (SYNTHROID) 200 MCG tablet Take 1 tablet (200 mcg total) by mouth daily.  90 tablet  1  . magnesium 30 MG tablet Take 30 mg by mouth 2 (two) times daily as needed (cramping in legs).       . meloxicam (MOBIC) 7.5 MG tablet TAKE 1 TABLET (7.5 MG TOTAL) BY MOUTH DAILY.  30 tablet  3  . olopatadine (PATANOL) 0.1 % ophthalmic solution Place 1 drop into both eyes 2 (two) times daily.  5 mL  3  . rosuvastatin (CRESTOR) 20 MG tablet Take 20 mg by mouth daily.        . nitroGLYCERIN (NITROSTAT) 0.4 MG SL tablet Place 1 tablet (0.4 mg total) under the tongue every 5 (five) minutes as needed for chest pain.  25 tablet  12   No current facility-administered medications on file prior to visit.    EXAM: BP 130/70  Pulse 68  Temp(Src) 97.8 F (36.6 C) (Oral)  Resp 18  Wt 182 lb (82.555 kg)  SpO2 97%     Objective:   Physical Exam  Nursing note and vitals reviewed. Constitutional: He is oriented to person, place, and time. He appears well-developed and well-nourished. No distress.  HENT:  Head: Normocephalic and atraumatic.  Right Ear: External ear normal.  Left Ear: External ear normal.  Nose: Nose normal.  Mouth/Throat: No oropharyngeal exudate.  Oropharynx is slightly erythematous, no exudate. Bilateral TMs normal. Bilateral frontal and maxillary sinuses non-TTP.   Eyes: Conjunctivae and EOM are normal. Pupils are equal, round, and reactive to light.  Neck: Normal range of motion. Neck supple. No JVD present.  Cardiovascular: Normal rate, regular rhythm, normal heart sounds and intact distal pulses.  Exam reveals no gallop and no friction rub.     No murmur heard. Pulmonary/Chest: Effort normal and breath sounds normal. No stridor. No respiratory distress. He has no wheezes. He has no rales. He exhibits no tenderness.  Musculoskeletal: Normal range of motion.  Neurological: He is alert and oriented to person, place, and time. He has normal reflexes. He displays normal reflexes. He exhibits normal muscle tone. Coordination normal.  Skin: Skin is warm and dry. No rash noted. He is not diaphoretic. No erythema. No pallor.  Psychiatric: He has a normal mood and affect. His behavior is normal. Judgment and thought content normal.   Orthostatic Blood Pressure: Blood pressure:   lying 124/66, sitting 112/62, standing 110/50 Pulse:   lying 60, sitting 66, standing 72  Lab Results  Component Value Date   WBC 4.6 09/06/2013   HGB 10.9* 09/06/2013   HCT 33.1* 09/06/2013   PLT 124* 09/06/2013   GLUCOSE 109* 02/16/2014   CHOL 114 09/06/2013   TRIG 117 09/06/2013   HDL 40 09/06/2013   LDLDIRECT 50.3 08/15/2010   LDLCALC 51 09/06/2013   ALT 8 09/05/2013   AST 12 09/05/2013   NA 140 02/16/2014   K 5.0 02/16/2014   CL 105 02/16/2014   CREATININE 4.7* 02/16/2014   BUN 74* 02/16/2014   CO2 26 02/16/2014   TSH 0.132* 10/03/2013   PSA 2.30 11/12/2006   INR 1.02 12/12/2010   HGBA1C 6.4 02/16/2014         Assessment & Plan:  [name] [name] was seen today for dizziness.  Diagnoses and associated orders for this visit:  Orthostatic dizziness Comments: Will check electrolytes and kidney function. Will decrease amlodipine to 5mg  daily. Will have pt schedule appointment with his Cardiologist. - Basic Metabolic Panel - amLODipine (NORVASC) 5 MG tablet; Take 1 tablet (5 mg total) by mouth daily.  Seasonal allergies Comments: Will have pt try OTC antihistamines and Nasacort to relieve sinus congestion.    Will have pt call and schedule appointment with his cardiologist, Dr. [name] within the next week or so, since we are changing his blood pressure  medication.  Return precautions provided, and patient handout on orthostatic blood pressure.  Plan to follow up in 2 weeks to reassess, or for worsening or persistent symptoms despite treatment.  Patient Instructions  We will call you with the results of your lab work when available.  Call Dr. [name] and schedule an appointment to see him since we are changing your medications for your blood pressure.  Amlodipine 5 mg daily.  Force NON dairy fluids, drinking plenty of water is best.    Over the Counter Nasacort AQ 1 spray in each nostril twice a day as needed. Use the "crossover" technique into opposite nostril spraying toward opposite ear @ 45 degree angle, not straight up into nostril.   Plain Over the Counter Allegra (NOT D )  160 daily , OR Loratidine 10 mg , OR Zyrtec 10 mg @ bedtime  as needed for itchy eyes & sneezing.  If emergency symptoms discussed during visit developed, seek medical attention immediately.  Followup in 2 weeks to reassess, or for worsening or persistent symptoms despite treatment.

## 2014-04-01 NOTE — Progress Notes (Signed)
Pre visit review using our clinic review tool, if applicable. No additional management support is needed unless otherwise documented below in the visit note. 

## 2014-04-01 NOTE — Patient Instructions (Addendum)
We will call you with the results of your lab work when available.  Call Dr. Jens Somrenshaw and schedule an appointment to see him since we are changing your medications for your blood pressure.  Amlodipine 5 mg daily.  Force NON dairy fluids, drinking plenty of water is best.    Over the Counter Nasacort AQ 1 spray in each nostril twice a day as needed. Use the "crossover" technique into opposite nostril spraying toward opposite ear @ 45 degree angle, not straight up into nostril.   Plain Over the Counter Allegra (NOT D )  160 daily , OR Loratidine 10 mg , OR Zyrtec 10 mg @ bedtime  as needed for itchy eyes & sneezing.  If emergency symptoms discussed during visit developed, seek medical attention immediately.  Followup in 2 weeks to reassess, or for worsening or persistent symptoms despite treatment.    Orthostatic Hypotension Orthostatic hypotension is a sudden drop in blood pressure. It happens when you quickly stand up from a seated or lying position. You may feel dizzy or light-headed. This can last for just a few seconds or for up to a few minutes. It is usually not a serious problem. However, if this happens frequently or gets worse, it can be a sign of something more serious. CAUSES  Different things can cause orthostatic hypotension, including:   Loss of body fluids (dehydration).  Medicines that lower blood pressure.  Sudden changes in posture, such as standing up quickly after you have been sitting or lying down.  Taking too much of your medicine. SIGNS AND SYMPTOMS   Light-headedness or dizziness.   Fainting or near-fainting.   A fast heart rate.   Weakness.   Feeling tired (fatigue).  DIAGNOSIS  Your health care provider may do several things to help diagnose your condition and identify the cause. These may include:   Taking a medical history and doing a physical exam.  Checking your blood pressure. Your health care provider will check your blood pressure  when you are:  Lying down.  Sitting.  Standing.  Using tilt table testing. In this test, you lie down on a table that moves from a lying position to a standing position. You will be strapped onto the table. This test monitors your blood pressure and heart rate when you are in different positions. TREATMENT  Treatment will vary depending on the cause. Possible treatments include:   Changing the dosage of your medicines.  Wearing compression stockings on your lower legs.  Standing up slowly after sitting or lying down.  Eating more salt.  Eating frequent, small meals.  In some cases, getting IV fluids.  Taking medicine to enhance fluid retention. HOME CARE INSTRUCTIONS  Only take over-the-counter or prescription medicines as directed by your health care provider.  Follow your health care provider's instructions for changing the dosage of your current medicines.  Do not stop or adjust your medicine on your own.  Stand up slowly after sitting or lying down. This allows your body to adjust to the different position.  Wear compression stockings as directed.  Eat extra salt as directed.  Do not add extra salt to your diet unless directed to by your health care provider.  Eat frequent, small meals.  Avoid standing suddenly after eating.  Avoid hot showers or excessive heat as directed by your health care provider.  Keep all follow-up appointments. SEEK MEDICAL CARE IF:  You continue to feel dizzy or light-headed after standing.  You feel groggy or confused.  You feel cold, clammy, or sick to your stomach (nauseous).  You have blurred vision.  You feel short of breath. SEEK IMMEDIATE MEDICAL CARE IF:   You faint after standing.  You have chest pain.  You have difficulty breathing.   You lose feeling or movement in your arms or legs.   You have slurred speech or difficulty talking, or you are unable to talk.  MAKE SURE YOU:   Understand these  instructions.  Will watch your condition.  Will get help right away if you are not doing well or get worse. Document Released: 09/22/2002 Document Revised: 10/07/2013 Document Reviewed: 07/25/2013 [facility] Patient Information 2015 [employer] [place]. This information is not intended to replace advice given to you by your health care provider. Make sure you discuss any questions you have with your health care provider.

## 2014-04-02 ENCOUNTER — Telehealth: Payer: Self-pay | Admitting: Internal Medicine

## 2014-04-02 ENCOUNTER — Telehealth: Payer: Self-pay

## 2014-04-02 MED ORDER — ISOSORBIDE MONONITRATE ER 60 MG PO TB24: 60.0000 mg | ORAL_TABLET | Freq: Every day | ORAL | Status: DC

## 2014-04-02 NOTE — Telephone Encounter (Signed)
[id number] - [place], [place] - [address] is requesting 90 day re-fill on isosorbide mononitrate (IMDUR) 60 MG 24 hr tablet

## 2014-04-02 NOTE — Telephone Encounter (Signed)
Rx sent to pharmacy   

## 2014-04-02 NOTE — Telephone Encounter (Signed)
Per Molli HazardMatthew pt needs to follow up with cardiology this week or next week. Called and spoke with office and pt had an upcoming appt on 7.13.15.  Advised office pt needed to be seen this week or next week.  Pt will be put on waiting list for a cancellation.  Per Molli HazardMatthew pt should keep his appointment with nephrologist on 7.6.15.  Called and spoke with pt and pt verbalized understanding.  Also, advised pt that Furosemide should now be 1/2 tablet.  Pt verbalized understanding.

## 2014-04-07 ENCOUNTER — Encounter: Payer: Self-pay | Admitting: Nurse Practitioner

## 2014-04-07 ENCOUNTER — Ambulatory Visit (INDEPENDENT_AMBULATORY_CARE_PROVIDER_SITE_OTHER): Payer: Medicare Other | Admitting: Nurse Practitioner

## 2014-04-07 VITALS — BP 150/60 | HR 60 | Ht 69.0 in | Wt 182.0 lb

## 2014-04-07 DIAGNOSIS — I2510 Atherosclerotic heart disease of native coronary artery without angina pectoris: Secondary | ICD-10-CM

## 2014-04-07 DIAGNOSIS — R42 Dizziness and giddiness: Secondary | ICD-10-CM

## 2014-04-07 DIAGNOSIS — I2589 Other forms of chronic ischemic heart disease: Secondary | ICD-10-CM

## 2014-04-07 DIAGNOSIS — I255 Ischemic cardiomyopathy: Secondary | ICD-10-CM

## 2014-04-07 DIAGNOSIS — I10 Essential (primary) hypertension: Secondary | ICD-10-CM

## 2014-04-07 MED ORDER — AMLODIPINE BESYLATE 5 MG PO TABS
5.0000 mg | ORAL_TABLET | Freq: Two times a day (BID) | ORAL | Status: DC
Start: 2014-04-07 — End: 2014-11-16

## 2014-04-07 NOTE — Patient Instructions (Signed)
Stay on your current medicines but increase the Norvasc (amlodipine) to two times a day   See Dr. Jens Somrenshaw back as planned  Try to monitor your blood pressure if you can  Call the [facility] office at [phone] if you have any questions, problems or concerns.

## 2014-04-07 NOTE — Progress Notes (Signed)
Ernestine Conrad Date of Birth: 24-Dec-1935 Medical Record #[id number]  History of Present Illness: Mr. Weddington is seen back today for a 5 month check. Seen for Dr. [name]. He has CAD with remote cath in 2005, PCI to the RCA in 2005. Echo from 2012 showed EF of 35% with mild MR, mildly dilated aortic root, mild right atrial and ventricular enlargement. Admitted in November of 2014 with chest pain and had Myoview - no ischemia and EF 76%. No ACE due to CKD. Other issues include HTN, CKD - followed by Dr. Briant Cedar, DM, gout and chronic pain issues.   Last seen here in January. Was doing well.  Comes back today. Here alone. Doing ok. Saw his PCP in regards to being dizzy - this happened if he looks up for too long or gets up too quickly. No syncope. No chest pain. Breathing ok. Sees Renal in early July - not really interested in dialysis. Thinks his Norvasc needs to be increased back up - has a headache. Not checking his BP at home - his cuff not working. Spends a lot of the visit talking about an illness that took place over 25 years ago - little hard to follow.   Current Outpatient Prescriptions  Medication Sig Dispense Refill  . acetaminophen (TYLENOL) 500 MG tablet Take 500 mg by mouth every 6 (six) hours as needed.      Marland Kitchen amLODipine (NORVASC) 5 MG tablet Take 1 tablet (5 mg total) by mouth daily.  30 tablet  3  . aspirin 325 MG tablet Take 325 mg by mouth daily.        . calcitRIOL (ROCALTROL) 0.25 MCG capsule 0.25 mcg. Take one cap by mouth on odd days and 2 caps on even days.       . carvedilol (COREG) 12.5 MG tablet Take 1 tablet (12.5 mg total) by mouth 2 (two) times daily with a meal.  60 tablet  11  . chlorzoxazone (PARAFON) 500 MG tablet Take 1 tablet (500 mg total) by mouth 3 (three) times daily as needed for muscle spasms.  60 tablet  1  . colchicine 0.6 MG tablet Take 0.5 tablets (0.3 mg total) by mouth daily.      . furosemide (LASIX) 80 MG tablet TAKE 1 TABLET BY MOUTH IN THE  MORNING AND 1/2 TABLET AT NIGHT  60 tablet  6  . glyBURIDE (DIABETA) 5 MG tablet Take 5 mg by mouth 2 (two) times daily with a meal.      . hydrALAZINE (APRESOLINE) 50 MG tablet Take 100 mg by mouth 3 (three) times daily.       Marland Kitchen HYDROcodone-acetaminophen (NORCO/VICODIN) 5-325 MG per tablet Take 1 tablet by mouth every 6 (six) hours as needed for moderate pain.  60 tablet  0  . isosorbide mononitrate (IMDUR) 60 MG 24 hr tablet Take 1 tablet (60 mg total) by mouth daily.  90 tablet  1  . levothyroxine (SYNTHROID) 200 MCG tablet Take 1 tablet (200 mcg total) by mouth daily.  90 tablet  1  . magnesium 30 MG tablet Take 30 mg by mouth 2 (two) times daily as needed (cramping in legs).       . meloxicam (MOBIC) 7.5 MG tablet TAKE 1 TABLET (7.5 MG TOTAL) BY MOUTH DAILY.  30 tablet  3  . olopatadine (PATANOL) 0.1 % ophthalmic solution Place 1 drop into both eyes 2 (two) times daily.  5 mL  3  . rosuvastatin (CRESTOR) 20 MG tablet  Take 20 mg by mouth daily.        . isosorbide mononitrate (IMDUR) 60 MG 24 hr tablet TAKE 1 TABLET BY MOUTH DAILY.  30 tablet  11  . nitroGLYCERIN (NITROSTAT) 0.4 MG SL tablet Place 1 tablet (0.4 mg total) under the tongue every 5 (five) minutes as needed for chest pain.  25 tablet  12   No current facility-administered medications for this visit.    Allergies  Allergen Reactions  . Tomato     Too much acid    Past Medical History  Diagnosis Date  . CAD (coronary artery disease)     s/p PCI to mid and mid-distal RCA, 40% sten L main, no sig dz LCx   . HTN (hypertension)   . Hyperlipidemia   . Cerebrovascular accident   . CHF (congestive heart failure)     EF 35% echo 2012 ; EF 44% myoview 2012  . Diabetes mellitus   . 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 Unspecified hypothyroidism   . Gout     Past Surgical History  Procedure Laterality Date  . Thyroidectomy      History  Smoking status  . Former Smoker -- 0.50 packs/day  . Types: Cigarettes    . Quit date: 10/16/1953  Smokeless tobacco  . Not on file    Comment: quit in the 70's    History  Alcohol Use No    Family History  Problem Relation Age of Onset  . Kidney failure Mother   . Diabetes Mother   . Cancer Father     lung  . Heart attack Brother     massive heart attack in 3050s  . Hypertension Mother     Review of Systems: The review of systems is per the HPI.  All other systems were reviewed and are negative.  Physical Exam: Ht 5\' 9"  (1.753 m)  Wt 182 lb (82.555 kg)  BMI 26.86 kg/m2 Patient is very pleasant and in no acute distress. Skin is warm and dry. Color is normal.  HEENT is unremarkable but has no teeth. Normocephalic/atraumatic. PERRL. Sclera are nonicteric. Neck is supple. No masses. No JVD. Lungs are clear. Cardiac exam shows a regular rate and rhythm. Abdomen is soft. Extremities are without edema. Gait and ROM are intact. No gross neurologic deficits noted.  LABORATORY DATA:  Lab Results  Component Value Date   WBC 4.6 09/06/2013   HGB 10.9* 09/06/2013   HCT 33.1* 09/06/2013   PLT 124* 09/06/2013   GLUCOSE 176* 04/01/2014   CHOL 114 09/06/2013   TRIG 117 09/06/2013   HDL 40 09/06/2013   LDLDIRECT 50.3 08/15/2010   LDLCALC 51 09/06/2013   ALT 8 09/05/2013   AST 12 09/05/2013   NA 139 04/01/2014   K 4.6 04/01/2014   CL 104 04/01/2014   CREATININE 4.2* 04/01/2014   BUN 92* 04/01/2014   CO2 25 04/01/2014   TSH 0.132* 10/03/2013   PSA 2.30 11/12/2006   INR 1.02 12/12/2010   HGBA1C 6.4 02/16/2014     BNP (last 3 results) No results found for this basename: PROBNP,  in the last 8760 hours   Assessment / Plan: 1. CAD - no active symptoms  2. ICM - EF recovered by last Myoview. No symptoms at present  3. HTN - BP 150/60 sitting and 160/60 standing. Norvasc increased back to 5 mg BID - may have smoother control. Try to get new BP cuff.  4. CKD - seeing Renal soon - not interested in dialysis at this time.  See back as planned.  Patient  is agreeable to this plan and will call if any problems develop in the interim.   Rosalio MacadamiaLori C. Gerhardt, RN, ANP-C [facility] [address] [place], [place]  [id number] [phone]

## 2014-04-13 ENCOUNTER — Encounter: Payer: Self-pay | Admitting: Physician Assistant

## 2014-04-13 ENCOUNTER — Ambulatory Visit (INDEPENDENT_AMBULATORY_CARE_PROVIDER_SITE_OTHER): Payer: Medicare Other | Admitting: Physician Assistant

## 2014-04-13 VITALS — BP 122/68 | HR 65 | Temp 98.6°F | Resp 18 | Wt 182.0 lb

## 2014-04-13 DIAGNOSIS — R42 Dizziness and giddiness: Secondary | ICD-10-CM

## 2014-04-13 DIAGNOSIS — N183 Chronic kidney disease, stage 3 unspecified: Secondary | ICD-10-CM

## 2014-04-13 DIAGNOSIS — I2510 Atherosclerotic heart disease of native coronary artery without angina pectoris: Secondary | ICD-10-CM

## 2014-04-13 MED ORDER — ROSUVASTATIN CALCIUM 20 MG PO TABS: 20.0000 mg | ORAL_TABLET | Freq: Every day | ORAL | Status: DC

## 2014-04-13 MED ORDER — FUROSEMIDE 80 MG PO TABS: 40.0000 mg | ORAL_TABLET | Freq: Every day | ORAL | Status: DC

## 2014-04-13 NOTE — Progress Notes (Signed)
Subjective:    Patient ID: [name] [name], male    DOB: 02/27/1936, [age] y.o.   MRN: [id number]  HPI Patient is a 78 year old African American male with a history of chronic kidney disease stage III, and CHF presenting to the clinic today for followup of orthostatic dizziness. Patient was seen 2 weeks ago in the clinic for dizziness most common whenever he stood up too quickly. His orthostatic blood pressures were not quite indicative of orthostatic hypotension at that time, however his blood pressures were lower than usual. His Norvasc was decreased from 10 mg daily to 5 mg a day. His kidney function was obtained and he was discovered to have a BUN of 942 and Cr of 4.2, and so his Lasix dose was cut in half. He had an appointment with his cardiologist on 04/07/2014 and was having elevated blood pressure readings and was experiencing a faint headache, and his Norvasc was increased to 5 mg twice daily. Today he presents stating that he is no longer experiencing dizziness, and no longer experiencing headache, and states that he is asymptomatic. He states he is tolerating all his medications well and denies adverse effects. He has an appointment scheduled with his nephrologist on 04/20/2014. He denies fevers, chills, nausea, vomiting, diarrhea, shortness of breath, chest pain, headache, syncope, dizziness.   Review of Systems As per history of present illness and otherwise negative.  Past Medical History  Diagnosis Date  . CAD (coronary artery disease)     s/p PCI to mid and mid-distal RCA, 40% sten L main, no sig dz LCx   . HTN (hypertension)   . Hyperlipidemia   . Cerebrovascular accident   . CHF (congestive heart failure)     EF 35% echo 2012 ; EF 44% myoview 2012; [facility]  . Diabetes mellitus   . 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 Unspecified hypothyroidism   . Gout     History   Social History  . Marital Status: Married    Spouse Name: N/A   Number of Children: N/A  . Years of Education: N/A   Occupational History  . Not on file.   Social History Main Topics  . Smoking status: Former Smoker -- 0.50 packs/day    Types: Cigarettes    Quit date: 10/16/1953  . Smokeless tobacco: Not on file     Comment: quit in the 70's  . Alcohol Use: No  . Drug Use: No  . Sexual Activity: Not Currently   Other Topics Concern  . Not on file   Social History Narrative   Lives in [place] with his wife and has 6 kids.     Past Surgical History  Procedure Laterality Date  . Thyroidectomy      Family History  Problem Relation Age of Onset  . Kidney failure Mother   . Diabetes Mother   . Cancer Father     lung  . Heart attack Brother     massive heart attack in 5450s  . Hypertension Mother     Allergies  Allergen Reactions  . Tomato     Too much acid    Current Outpatient Prescriptions on File Prior to Visit  Medication Sig Dispense Refill  . acetaminophen (TYLENOL) 500 MG tablet Take 500 mg by mouth every 6 (six) hours as needed.      Marland Kitchen. amLODipine (NORVASC) 5 MG tablet Take 1 tablet (5 mg total) by mouth  2 (two) times daily.  60 tablet  6  . aspirin 325 MG tablet Take 325 mg by mouth daily.        . calcitRIOL (ROCALTROL) 0.25 MCG capsule 0.25 mcg. Take one cap by mouth on odd days and 2 caps on even days.       . carvedilol (COREG) 12.5 MG tablet Take 1 tablet (12.5 mg total) by mouth 2 (two) times daily with a meal.  60 tablet  11  . chlorzoxazone (PARAFON) 500 MG tablet Take 1 tablet (500 mg total) by mouth 3 (three) times daily as needed for muscle spasms.  60 tablet  1  . colchicine 0.6 MG tablet Take 0.5 tablets (0.3 mg total) by mouth daily.      Marland Kitchen. glyBURIDE (DIABETA) 5 MG tablet Take 5 mg by mouth 2 (two) times daily with a meal.      . hydrALAZINE (APRESOLINE) 50 MG tablet Take 100 mg by mouth 3 (three) times daily.       Marland Kitchen. HYDROcodone-acetaminophen (NORCO/VICODIN) 5-325 MG per tablet Take 1 tablet by mouth every  6 (six) hours as needed for moderate pain.  60 tablet  0  . isosorbide mononitrate (IMDUR) 60 MG 24 hr tablet TAKE 1 TABLET BY MOUTH DAILY.  30 tablet  11  . isosorbide mononitrate (IMDUR) 60 MG 24 hr tablet Take 1 tablet (60 mg total) by mouth daily.  90 tablet  1  . levothyroxine (SYNTHROID) 200 MCG tablet Take 1 tablet (200 mcg total) by mouth daily.  90 tablet  1  . magnesium 30 MG tablet Take 30 mg by mouth 2 (two) times daily as needed (cramping in legs).       . meloxicam (MOBIC) 7.5 MG tablet TAKE 1 TABLET (7.5 MG TOTAL) BY MOUTH DAILY.  30 tablet  3  . olopatadine (PATANOL) 0.1 % ophthalmic solution Place 1 drop into both eyes 2 (two) times daily.  5 mL  3  . nitroGLYCERIN (NITROSTAT) 0.4 MG SL tablet Place 1 tablet (0.4 mg total) under the tongue every 5 (five) minutes as needed for chest pain.  25 tablet  12   No current facility-administered medications on file prior to visit.    EXAM: BP 122/68  Pulse 65  Temp(Src) 98.6 F (37 C) (Oral)  Resp 18  Wt 182 lb (82.555 kg)  SpO2 98%     Objective:   Physical Exam  Nursing note and vitals reviewed. Constitutional: He is oriented to person, place, and time. He appears well-developed and well-nourished. No distress.  HENT:  Head: Normocephalic and atraumatic.  Eyes: Conjunctivae and EOM are normal. Pupils are equal, round, and reactive to light.  Neck: Normal range of motion.  Cardiovascular: Normal rate, regular rhythm and intact distal pulses.   Pulmonary/Chest: Effort normal and breath sounds normal. No respiratory distress. He exhibits no tenderness.  Musculoskeletal: Normal range of motion. He exhibits no edema.  Neurological: He is alert and oriented to person, place, and time.  Skin: Skin is warm and dry. No rash noted. He is not diaphoretic. No erythema. No pallor.  Psychiatric: He has a normal mood and affect. His behavior is normal. Judgment and thought content normal.     Lab Results  Component Value Date    WBC 4.6 09/06/2013   HGB 10.9* 09/06/2013   HCT 33.1* 09/06/2013   PLT 124* 09/06/2013   GLUCOSE 176* 04/01/2014   CHOL 114 09/06/2013   TRIG 117 09/06/2013   HDL 40  09/06/2013   LDLDIRECT 50.3 08/15/2010   LDLCALC 51 09/06/2013   ALT 8 09/05/2013   AST 12 09/05/2013   NA 139 04/01/2014   K 4.6 04/01/2014   CL 104 04/01/2014   CREATININE 4.2* 04/01/2014   BUN 92* 04/01/2014   CO2 25 04/01/2014   TSH 0.132* 10/03/2013   PSA 2.30 11/12/2006   INR 1.02 12/12/2010   HGBA1C 6.4 02/16/2014        Assessment & Plan:  [name] [name] was seen today for follow-up.  Diagnoses and associated orders for this visit:  Orthostatic dizziness Comments: Resolved with better control of BP. Continue current medications.  Chronic renal disease, stage III Comments: Has appointment with Nephro on 04/20/2014. Est GFR 15, will dc mobic and decrease aspirin to 81 mg. Furosemide has been decreased to 40mg  daily.  - furosemide (LASIX) 80 MG tablet; Take 0.5 tablets (40 mg total) by mouth daily.  Other Orders - rosuvastatin (CRESTOR) 20 MG tablet; Take 1 tablet (20 mg total) by mouth daily.    Pt will keep scheduled appointment with Nephrology on 04/20/2014. Medications that could negatively effect kidneys were discussed and discontinued.  Pt is to schedule appointment to establish with new PCP when available.  Return precautions provided, and patient handout on end stage kidney disease.  Plan to follow up as needed, or for worsening or persistent symptoms despite treatment.  Patient Instructions  Continue taking current medications as directed.  Stop taking your Mobic if you are as it can cause kidney damage.  Decrease your 325mg  Aspirin daily to an 81mg  baby aspirin daily. This should be just as effective, and will also be less irritating to your kidneys.  Keep your appointment with Nephrology on 04/20/2014.  If emergency symptoms discussed during visit developed, seek medical attention immediately.  Followup  as needed, or for worsening or persistent symptoms despite treatment.

## 2014-04-13 NOTE — Progress Notes (Signed)
Pre visit review using our clinic review tool, if applicable. No additional management support is needed unless otherwise documented below in the visit note. 

## 2014-04-13 NOTE — Patient Instructions (Signed)
Continue taking current medications as directed.  Stop taking your Mobic if you are as it can cause kidney damage.  Decrease your 325mg  Aspirin daily to an 81mg  baby aspirin daily. This should be just as effective, and will also be less irritating to your kidneys.  Keep your appointment with Nephrology on 04/20/2014.  If emergency symptoms discussed during visit developed, seek medical attention immediately.  Followup as needed, or for worsening or persistent symptoms despite treatment.    End-Stage Kidney Disease The kidneys are two organs that lie on either side of the spine between the middle of the back and the front of the abdomen. The kidneys:   Remove wastes and extra water from the blood.   Produce important hormones. These help keep bones strong, regulate blood pressure, and help create red blood cells.   Balance the fluids and chemicals in the blood and tissues. End-stage kidney disease occurs when the kidneys are so damaged that they cannot do their job. When the kidneys cannot do their job, life-threatening problems occur. The body cannot stay clean and strong without the help of the kidneys. In end-stage kidney disease, the kidneys cannot get better.You need a new kidney or treatments to do some of the work healthy kidneys do in order to stay alive. CAUSES  End-stage kidney disease usually occurs when a long-lasting (chronic) kidney disease gets worse. It may also occur after the kidneys are suddenly damaged (acute kidney injury).  SYMPTOMS   Swelling (edema) of the legs, ankles, or feet.   Tiredness (lethargy).   Nausea or vomiting.   Confusion.   Problems with urination, such as:   Decreased urine production.   Frequent urination, especially at night.   Frequent accidents in children who are potty trained.   Muscle twitches and cramps.   Persistent itchiness.   Loss of appetite.   Headaches.   Abnormally dark or light skin.   Numbness  in the hands or feet.   Easy bruising.   Frequent hiccups.   Menstruation stops. DIAGNOSIS  Your caregiver will measure your blood pressure and take some tests. These may include:   Urine tests.   Blood tests.   Imaging tests, such as:   An ultrasound exam.   Computed tomography (CT).  A kidney biopsy. TREATMENT  There are two treatments for end-stage kidney disease:   A procedure that removes toxic wastes from the body (dialysis).   Receiving a new kidney (kidney transplant). Both of these treatments have serious risks and consequences. Your caregiver will help you determine which treatment is best for you based on your health, age, and other factors. In addition to having dialysis or a kidney transplant, you may need to take medicines to control high blood pressure (hypertension) and cholesterol and to decrease phosphorus levels in your blood.  HOME CARE INSTRUCTIONS  Follow your prescribed diet.   Only take over-the-counter or prescription medicines as directed by your caregiver.   Do not take any new medicines (prescription, over-the-counter, or nutritional supplements) unless approved by your caregiver. Many medicines can worsen your kidney damage or need to have the dose adjusted.   Keep all follow-up appointments. MAKE SURE YOU:  Understand these instructions.  Will watch your condition.  Will get help right away if you are not doing well or get worse Document Released: 12/23/2003 Document Revised: 09/18/2012 Document Reviewed: 05/31/2012 [facility] Patient Information 2015 [place], [place]. This information is not intended to replace advice given to you by your health care provider.  Make sure you discuss any questions you have with your health care provider.  

## 2014-04-27 ENCOUNTER — Ambulatory Visit: Payer: Medicare Other | Admitting: Nurse Practitioner

## 2014-06-15 ENCOUNTER — Ambulatory Visit (INDEPENDENT_AMBULATORY_CARE_PROVIDER_SITE_OTHER): Payer: Medicare Other | Admitting: Family

## 2014-06-15 ENCOUNTER — Encounter: Payer: Self-pay | Admitting: Family

## 2014-06-15 VITALS — BP 142/72 | HR 67 | Ht 69.0 in | Wt 183.5 lb

## 2014-06-15 DIAGNOSIS — M1710 Unilateral primary osteoarthritis, unspecified knee: Secondary | ICD-10-CM

## 2014-06-15 DIAGNOSIS — I2510 Atherosclerotic heart disease of native coronary artery without angina pectoris: Secondary | ICD-10-CM

## 2014-06-15 DIAGNOSIS — M1712 Unilateral primary osteoarthritis, left knee: Secondary | ICD-10-CM

## 2014-06-15 DIAGNOSIS — M1711 Unilateral primary osteoarthritis, right knee: Secondary | ICD-10-CM

## 2014-06-15 MED ORDER — METHYLPREDNISOLONE ACETATE 40 MG/ML IJ SUSP: 40.0000 mg | Freq: Once | INTRAMUSCULAR | Status: DC

## 2014-06-15 NOTE — Progress Notes (Signed)
Subjective:    Patient ID: [name] [name], male    DOB: 19-Oct-1935, 78 y.o.   MRN: [id number]  HPI 78 year old ML with a history of osteoarthritis of the knees is in today requesting bilateral knee injections.   Review of Systems  Constitutional: Negative.   Respiratory: Negative.   Cardiovascular: Negative.   Musculoskeletal: Positive for arthralgias.       Knee pain bilaterally.   Neurological: Negative.   Psychiatric/Behavioral: Negative.   All other systems reviewed and are negative.  Past Medical History  Diagnosis Date  . CAD (coronary artery disease)     s/p PCI to mid and mid-distal RCA, 40% sten L main, no sig dz LCx   . HTN (hypertension)   . Hyperlipidemia   . Cerebrovascular accident   . CHF (congestive heart failure)     EF 35% echo 2012 ; EF 44% myoview 2012; EF 76% by [facility] November 2014  . Diabetes mellitus   . 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 Unspecified hypothyroidism   . Gout     History   Social History  . Marital Status: Married    Spouse Name: N/A    Number of Children: N/A  . Years of Education: N/A   Occupational History  . Not on file.   Social History Main Topics  . Smoking status: Former Smoker -- 0.50 packs/day    Types: Cigarettes    Quit date: 10/16/1953  . Smokeless tobacco: Not on file     Comment: quit in the 70's  . Alcohol Use: No  . Drug Use: No  . Sexual Activity: Not Currently   Other Topics Concern  . Not on file   Social History Narrative   Lives in [place] with his wife and has 6 kids.     Past Surgical History  Procedure Laterality Date  . Thyroidectomy      Family History  Problem Relation Age of Onset  . Kidney failure Mother   . Diabetes Mother   . Cancer Father     lung  . Heart attack Brother     massive heart attack in 33s  . Hypertension Mother     Allergies  Allergen Reactions  . Tomato     Too much acid    Current Outpatient Prescriptions on File Prior to Visit    Medication Sig Dispense Refill  . acetaminophen (TYLENOL) 500 MG tablet Take 500 mg by mouth every 6 (six) hours as needed.      Marland Kitchen amLODipine (NORVASC) 5 MG tablet Take 1 tablet (5 mg total) by mouth 2 (two) times daily.  60 tablet  6  . aspirin 325 MG tablet Take 325 mg by mouth daily.        . calcitRIOL (ROCALTROL) 0.25 MCG capsule 0.25 mcg. Take one cap by mouth on odd days and 2 caps on even days.       . carvedilol (COREG) 12.5 MG tablet Take 1 tablet (12.5 mg total) by mouth 2 (two) times daily with a meal.  60 tablet  11  . chlorzoxazone (PARAFON) 500 MG tablet Take 1 tablet (500 mg total) by mouth 3 (three) times daily as needed for muscle spasms.  60 tablet  1  . colchicine 0.6 MG tablet Take 0.5 tablets (0.3 mg total) by mouth daily.      . furosemide (LASIX) 80 MG tablet Take 0.5 tablets (40 mg total) by mouth daily.  60  tablet  6  . glyBURIDE (DIABETA) 5 MG tablet Take 5 mg by mouth 2 (two) times daily with a meal.      . hydrALAZINE (APRESOLINE) 50 MG tablet Take 100 mg by mouth 3 (three) times daily.       Marland Kitchen HYDROcodone-acetaminophen (NORCO/VICODIN) 5-325 MG per tablet Take 1 tablet by mouth every 6 (six) hours as needed for moderate pain.  60 tablet  0  . isosorbide mononitrate (IMDUR) 60 MG 24 hr tablet TAKE 1 TABLET BY MOUTH DAILY.  30 tablet  11  . isosorbide mononitrate (IMDUR) 60 MG 24 hr tablet Take 1 tablet (60 mg total) by mouth daily.  90 tablet  1  . levothyroxine (SYNTHROID) 200 MCG tablet Take 1 tablet (200 mcg total) by mouth daily.  90 tablet  1  . magnesium 30 MG tablet Take 30 mg by mouth 2 (two) times daily as needed (cramping in legs).       . meloxicam (MOBIC) 7.5 MG tablet TAKE 1 TABLET (7.5 MG TOTAL) BY MOUTH DAILY.  30 tablet  3  . olopatadine (PATANOL) 0.1 % ophthalmic solution Place 1 drop into both eyes 2 (two) times daily.  5 mL  3  . rosuvastatin (CRESTOR) 20 MG tablet Take 1 tablet (20 mg total) by mouth daily.  90 tablet  3  . nitroGLYCERIN  (NITROSTAT) 0.4 MG SL tablet Place 1 tablet (0.4 mg total) under the tongue every 5 (five) minutes as needed for chest pain.  25 tablet  12   No current facility-administered medications on file prior to visit.    BP 142/72  Pulse 67  Ht  (1.753 m)  Wt 183 lb 8 oz (83.235 kg)  BMI 27.09 kg/m2chart    Objective:   Physical Exam  Constitutional: He is oriented to person, place, and time. He appears well-nourished.  Neck: Normal range of motion. Neck supple.  Cardiovascular: Normal rate, regular rhythm and normal heart sounds.   Pulmonary/Chest: Effort normal and breath sounds normal.  Musculoskeletal: He exhibits tenderness. He exhibits no edema.  Pain with flexion.  Neurological: He is alert and oriented to person, place, and time.  Skin: Skin is warm and dry.  Psychiatric: He has a normal mood and affect.   Right Knee: Informed consent obtained and the patient's knee was prepped with betadine. Local anesthesia was obtained with topical spray. Then 40 mg of Depo-Medrol and 1/2 cc of lidocaine was injected into the joint space. The patient tolerated the procedure without complications. Post injection care discussed with patient.  Left Knee:  Informed consent obtained and the patient's knee was prepped with betadine. Local anesthesia was obtained with topical spray. Then 40 mg of Depo-Medrol and 1/2 cc of lidocaine was injected into the joint space. The patient tolerated the procedure without complications. Post injection care discussed with patient.      Assessment & Plan:  [name] [name] was seen today for knee pain.  Diagnoses and associated orders for this visit:  Primary osteoarthritis of left knee - methylPREDNISolone acetate (DEPO-MEDROL) injection 40 mg; Inject 1 mL (40 mg total) into the articular space once.  Primary osteoarthritis of right knee - methylPREDNISolone acetate (DEPO-MEDROL) injection 40 mg; Inject 1 mL (40 mg total) into the muscle once.   Call the office with  any questions or concerns. Recheck as scheduled and as needed.

## 2014-06-15 NOTE — Patient Instructions (Signed)
Joint Injection  Care After  Refer to this sheet in the next few days. These instructions provide you with information on caring for yourself after you have had a joint injection. Your caregiver also may give you more specific instructions. Your treatment has been planned according to current medical practices, but problems sometimes occur. Call your caregiver if you have any problems or questions after your procedure.  After any type of joint injection, it is not uncommon to experience:  · Soreness, swelling, or bruising around the injection site.  · Mild numbness, tingling, or weakness around the injection site caused by the numbing medicine used before or with the injection.  It also is possible to experience the following effects associated with the specific agent after injection:  · Iodine-based contrast agents:  ¨ Allergic reaction (itching, hives, widespread redness, and swelling beyond the injection site).  · Corticosteroids (These effects are rare.):  ¨ Allergic reaction.  ¨ Increased blood sugar levels (If you have diabetes and you notice that your blood sugar levels have increased, notify your caregiver).  ¨ Increased blood pressure levels.  ¨ Mood swings.  · Hyaluronic acid in the use of viscosupplementation.  ¨ Temporary heat or redness.  ¨ Temporary rash and itching.  ¨ Increased fluid accumulation in the injected joint.  These effects all should resolve within a day after your procedure.   HOME CARE INSTRUCTIONS  · Limit yourself to light activity the day of your procedure. Avoid lifting heavy objects, bending, stooping, or twisting.  · Take prescription or over-the-counter pain medication as directed by your caregiver.  · You may apply ice to your injection site to reduce pain and swelling the day of your procedure. Ice may be applied 03-04 times:  ¨ Put ice in a plastic bag.  ¨ Place a towel between your skin and the bag.  ¨ Leave the ice on for no longer than 15-20 minutes each time.  SEEK  IMMEDIATE MEDICAL CARE IF:   · Pain and swelling get worse rather than better or extend beyond the injection site.  · Numbness does not go away.  · Blood or fluid continues to leak from the injection site.  · You have chest pain.  · You have swelling of your face or tongue.  · You have trouble breathing or you become dizzy.  · You develop a fever, chills, or severe tenderness at the injection site that last longer than 1 day.  MAKE SURE YOU:  · Understand these instructions.  · Watch your condition.  · Get help right away if you are not doing well or if you get worse.  Document Released: 06/15/2011 Document Revised: 12/25/2011 Document Reviewed: 06/15/2011  ExitCare® Patient Information ©2015 [employer], [employer]. This information is not intended to replace advice given to you by your health care provider. Make sure you discuss any questions you have with your health care provider.

## 2014-06-15 NOTE — Progress Notes (Signed)
Pre visit review using our clinic review tool, if applicable. No additional management support is needed unless otherwise documented below in the visit note. 

## 2014-06-19 ENCOUNTER — Encounter: Payer: Self-pay | Admitting: Family Medicine

## 2014-06-19 ENCOUNTER — Ambulatory Visit (INDEPENDENT_AMBULATORY_CARE_PROVIDER_SITE_OTHER): Payer: Medicare Other | Admitting: Family Medicine

## 2014-06-19 VITALS — BP 122/60 | HR 68 | Temp 98.8°F | Wt 184.0 lb

## 2014-06-19 DIAGNOSIS — M1710 Unilateral primary osteoarthritis, unspecified knee: Secondary | ICD-10-CM

## 2014-06-19 DIAGNOSIS — M109 Gout, unspecified: Secondary | ICD-10-CM

## 2014-06-19 DIAGNOSIS — Z23 Encounter for immunization: Secondary | ICD-10-CM | POA: Diagnosis not present

## 2014-06-19 DIAGNOSIS — E039 Hypothyroidism, unspecified: Secondary | ICD-10-CM

## 2014-06-19 DIAGNOSIS — M549 Dorsalgia, unspecified: Secondary | ICD-10-CM

## 2014-06-19 DIAGNOSIS — N183 Chronic kidney disease, stage 3 unspecified: Secondary | ICD-10-CM

## 2014-06-19 DIAGNOSIS — E875 Hyperkalemia: Secondary | ICD-10-CM

## 2014-06-19 DIAGNOSIS — T887XXA Unspecified adverse effect of drug or medicament, initial encounter: Secondary | ICD-10-CM

## 2014-06-19 DIAGNOSIS — E1165 Type 2 diabetes mellitus with hyperglycemia: Secondary | ICD-10-CM

## 2014-06-19 DIAGNOSIS — E785 Hyperlipidemia, unspecified: Secondary | ICD-10-CM

## 2014-06-19 DIAGNOSIS — R072 Precordial pain: Secondary | ICD-10-CM

## 2014-06-19 DIAGNOSIS — I10 Essential (primary) hypertension: Secondary | ICD-10-CM

## 2014-06-19 DIAGNOSIS — Z8679 Personal history of other diseases of the circulatory system: Secondary | ICD-10-CM

## 2014-06-19 DIAGNOSIS — E1129 Type 2 diabetes mellitus with other diabetic kidney complication: Secondary | ICD-10-CM

## 2014-06-19 DIAGNOSIS — I509 Heart failure, unspecified: Secondary | ICD-10-CM

## 2014-06-19 DIAGNOSIS — I2510 Atherosclerotic heart disease of native coronary artery without angina pectoris: Secondary | ICD-10-CM

## 2014-06-19 DIAGNOSIS — R252 Cramp and spasm: Secondary | ICD-10-CM

## 2014-06-19 LAB — HEMOGLOBIN A1C
Hgb A1c MFr Bld: 7.1 % — ABNORMAL HIGH (ref <5.7)
Mean Plasma Glucose: 157 mg/dL — ABNORMAL HIGH (ref <117)

## 2014-06-19 MED ORDER — GLYBURIDE 5 MG PO TABS: ORAL_TABLET | ORAL | Status: DC

## 2014-06-19 NOTE — Assessment & Plan Note (Addendum)
Patient really seems to be leaning away from having dialysis. Discussed with him for many minutes. At this point he will not proceed with graft/fistula

## 2014-06-19 NOTE — Progress Notes (Signed)
[name], MD Phone: [phone]  Subjective:  Patient presents today to establish care with me as their new primary care provider. Patient was formerly a patient of Dr. Lovell Sheehan. Chief complaint-noted.   DIABETES Type II Lab Results  Component Value Date   HGBA1C 6.4 02/16/2014   HGBA1C 6.5* 10/03/2013   HGBA1C 6.6* 09/05/2013  Medications taking and tolerating-yes, amaryl 5 mg BID Blood Sugars per patient-fasting-did not bring log today On Aspirin-yes On statin-crestor Daily foot monitoring-no, advised  ROS- Denies Polyuria,Polydipsia, nocturia, Vision changes, feet or hand numbness/pain/tingling. Endorses Hypoglycemia symptoms intermittently at least 1x a week before his 2nd meal of the day.   Health Maintenance Due  Topic Date Due  . Foot Exam -today 11/23/1945  . Tetanus/tdap -today 10/16/2006  . Ophthalmology Exam - advised to get 11/18/2011  . Influenza Vaccine -today 05/16/2014   CKD stage IV Following with Dr. Briant Cedar. Patient is not sure whether he wants to have dialysis or not. He has not had a graft placed yet. He did not have a great experience with his mother on dialysis. Recent GFR between 15 and 20. Says he saw nephrology August 9. ROS- still making urine, no confusion  Hyperlipidemia On statin: yes Regular exercise: no ROS- no chest pain or shortness of breath. No myalgias  The following were reviewed and entered/updated in epic: Past Medical History  Diagnosis Date  . CAD (coronary artery disease)     s/p PCI to mid and mid-distal RCA, 40% sten L main, no sig dz LCx   . HTN (hypertension)   . Hyperlipidemia   . Cerebrovascular accident   . CHF (congestive heart failure)     EF 35% echo 2012 ; EF 44% myoview 2012; EF 76% by [facility] November 2014  . Diabetes mellitus   . 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 Unspecified hypothyroidism   . Gout    Patient Active Problem List   Diagnosis Date Noted  . Ischemic cardiomyopathy  04/13/2011    Priority: High  . CONGESTIVE HEART FAILURE 09/07/2008    Priority: High  . Type II or unspecified type diabetes mellitus with renal manifestations, uncontrolled(250.42) 10/31/2007    Priority: High  . CORONARY ARTERY DISEASE 09/25/2007    Priority: High  . BACK PAIN 03/20/2008    Priority: Medium  . Chronic renal disease, stage IV 10/31/2007    Priority: Medium  . HYPOTHYROIDISM 04/09/2007    Priority: Medium  . HYPERLIPIDEMIA 04/09/2007    Priority: Medium  . GOUT 04/09/2007    Priority: Medium  . HYPERTENSION 04/09/2007    Priority: Medium  . CEREBROVASCULAR ACCIDENT, HX OF 04/09/2007    Priority: Medium  . LOC OSTEOARTHROS NOT SPEC PRIM/SEC LOWER LEG 04/06/2010    Priority: Low  . LEG CRAMPS, IDIOPATHIC 04/06/2010    Priority: Low   Past Surgical History  Procedure Laterality Date  . Thyroidectomy      Family History  Problem Relation Age of Onset  . Kidney failure Mother   . Diabetes Mother   . Cancer Father     lung  . Heart attack Brother     massive heart attack in 61s  . Hypertension Mother     Medications- reviewed and updated Current Outpatient Prescriptions  Medication Sig Dispense Refill  . amLODipine (NORVASC) 5 MG tablet Take 1 tablet (5 mg total) by mouth 2 (two) times daily.  60 tablet  6  . aspirin 325 MG tablet Take 325  mg by mouth daily.        . calcitRIOL (ROCALTROL) 0.25 MCG capsule 0.25 mcg. Take one cap by mouth on odd days and 2 caps on even days.       . carvedilol (COREG) 12.5 MG tablet Take 1 tablet (12.5 mg total) by mouth 2 (two) times daily with a meal.  60 tablet  11  . colchicine 0.6 MG tablet Take 0.5 tablets (0.3 mg total) by mouth daily.      . furosemide (LASIX) 80 MG tablet Take 0.5 tablets (40 mg total) by mouth daily.  60 tablet  6  . glyBURIDE (DIABETA) 5 MG tablet Take 1/2 a pill before breakfast and full pill before 2nd meal of the day.  30 tablet  11  . hydrALAZINE (APRESOLINE) 50 MG tablet Take 100 mg by  mouth 3 (three) times daily.       . isosorbide mononitrate (IMDUR) 60 MG 24 hr tablet TAKE 1 TABLET BY MOUTH DAILY.  30 tablet  11  . isosorbide mononitrate (IMDUR) 60 MG 24 hr tablet Take 1 tablet (60 mg total) by mouth daily.  90 tablet  1  . levothyroxine (SYNTHROID) 200 MCG tablet Take 1 tablet (200 mcg total) by mouth daily.  90 tablet  1  . magnesium 30 MG tablet Take 30 mg by mouth 2 (two) times daily as needed (cramping in legs).       Marland Kitchen olopatadine (PATANOL) 0.1 % ophthalmic solution Place 1 drop into both eyes 2 (two) times daily.  5 mL  3  . rosuvastatin (CRESTOR) 20 MG tablet Take 1 tablet (20 mg total) by mouth daily.  90 tablet  3  . acetaminophen (TYLENOL) 500 MG tablet Take 500 mg by mouth every 6 (six) hours as needed.      . chlorzoxazone (PARAFON) 500 MG tablet Take 1 tablet (500 mg total) by mouth 3 (three) times daily as needed for muscle spasms.  60 tablet  1  . HYDROcodone-acetaminophen (NORCO/VICODIN) 5-325 MG per tablet Take 1 tablet by mouth every 6 (six) hours as needed for moderate pain.  60 tablet  0  . nitroGLYCERIN (NITROSTAT) 0.4 MG SL tablet Place 1 tablet (0.4 mg total) under the tongue every 5 (five) minutes as needed for chest pain.  25 tablet  12   No current facility-administered medications for this visit.    Allergies-reviewed and updated Allergies  Allergen Reactions  . Tomato     Too much acid    History   Social History  . Marital Status: Married    Spouse Name: N/A    Number of Children: N/A  . Years of Education: N/A   Social History Main Topics  . Smoking status: Former Smoker -- 0.50 packs/day    Types: Cigarettes    Quit date: 10/16/1953  . Smokeless tobacco: None     Comment: quit in the 70's  . Alcohol Use: No  . Drug Use: No  . Sexual Activity: Not Currently   Other Topics Concern  . None   Social History Narrative   Lives in [place] with his wife and has 6 kids.     ROS--See HPI   Objective: BP 122/60  Pulse 68   Temp(Src) 98.8 F (37.1 C)  Wt 184 lb (83.462 kg) Gen: NAD, resting comfortably in chair CV: RRR no murmurs rubs or gallops Lungs: CTAB no crackles, wheeze, rhonchi Abdomen: soft/nontender/nondistended/normal bowel sounds. No rebound or guarding.  Ext: no edema Skin: warm,  dry Neuro: grossly normal, moves all extremities, PERRLA DM foot exam normal, 1+ DP and PT pulses  Assessment/Plan:  Type II or unspecified type diabetes mellitus with renal manifestations, uncontrolled(250.42) Due to hypoglycemia before lunch at times, I have asked patient to half his morning glyburide. He can continue 5 mg before second meal of the day. Discussed A1c goal of 7.5 as reasonable-could consider 8  Chronic renal disease, stage IV Patient really seems to be leaning away from having dialysis. Discussed with him for many minutes. At this point he will not proceed with graft/fistula  HYPERLIPIDEMIA Well-controlled on Crestor 20 mg. Continue    Orders Placed This Encounter  Procedures  . Tdap vaccine greater than or equal to 7yo IM  . Hemoglobin A1c    Meds ordered this encounter  Medications  . glyBURIDE (DIABETA) 5 MG tablet    Sig: Take 1/2 a pill before breakfast and full pill before 2nd meal of the day.    Dispense:  30 tablet    Refill:  11

## 2014-06-19 NOTE — Assessment & Plan Note (Signed)
Well-controlled on Crestor 20 mg. Continue

## 2014-06-19 NOTE — Assessment & Plan Note (Signed)
Due to hypoglycemia before lunch at times, I have asked patient to half his morning glyburide. He can continue 5 mg before second meal of the day. Discussed A1c goal of 7.5 as reasonable-could consider 8

## 2014-06-19 NOTE — Patient Instructions (Signed)
Let's check a1c today. As long as things are less than 7, we are going to reduce your glyburide to 1/2 a pill before breakfast and full pill before 2nd meal of the day.   Health Maintenance Due  Topic Date Due  . Foot Exam -today 11/23/1945  . Tetanus/tdap -today 10/16/2006  . Ophthalmology Exam - advised to get 11/18/2011  . Influenza Vaccine -today

## 2014-07-24 ENCOUNTER — Ambulatory Visit (INDEPENDENT_AMBULATORY_CARE_PROVIDER_SITE_OTHER): Payer: Medicare Other | Admitting: Family Medicine

## 2014-07-24 ENCOUNTER — Encounter: Payer: Self-pay | Admitting: Family Medicine

## 2014-07-24 ENCOUNTER — Telehealth: Payer: Self-pay

## 2014-07-24 VITALS — BP 120/60 | HR 80 | Temp 98.3°F | Wt 181.0 lb

## 2014-07-24 DIAGNOSIS — R6883 Chills (without fever): Secondary | ICD-10-CM

## 2014-07-24 DIAGNOSIS — B349 Viral infection, unspecified: Secondary | ICD-10-CM

## 2014-07-24 LAB — COMPREHENSIVE METABOLIC PANEL
ALT: 18 U/L (ref 0–53)
AST: 19 U/L (ref 0–37)
Albumin: 3.5 g/dL (ref 3.5–5.2)
Alkaline Phosphatase: 32 U/L — ABNORMAL LOW (ref 39–117)
BUN: 91 mg/dL (ref 6–23)
CO2: 23 meq/L (ref 19–32)
CREATININE: 4.1 mg/dL — AB (ref 0.4–1.5)
Calcium: 9.6 mg/dL (ref 8.4–10.5)
Chloride: 103 mEq/L (ref 96–112)
GFR: 18.46 mL/min — AB (ref >60.00)
GLUCOSE: 287 mg/dL — AB (ref 70–99)
Potassium: 4.6 mEq/L (ref 3.5–5.1)
Sodium: 137 mEq/L (ref 135–145)
TOTAL PROTEIN: 7.8 g/dL (ref 6.0–8.3)
Total Bilirubin: 0.6 mg/dL (ref 0.2–1.2)

## 2014-07-24 LAB — CBC WITH DIFFERENTIAL/PLATELET
Basophils Absolute: 0 10*3/uL (ref 0.0–0.1)
Basophils Relative: 0.5 % (ref 0.0–3.0)
EOS ABS: 0.2 10*3/uL (ref 0.0–0.7)
Eosinophils Relative: 4 % (ref 0.0–5.0)
HCT: 34.2 % — ABNORMAL LOW (ref 39.0–52.0)
Hemoglobin: 11.1 g/dL — ABNORMAL LOW (ref 13.0–17.0)
LYMPHS ABS: 0.6 10*3/uL — AB (ref 0.7–4.0)
LYMPHS PCT: 9.9 % — AB (ref 12.0–46.0)
MCHC: 32.5 g/dL (ref 30.0–36.0)
MCV: 93.9 fl (ref 78.0–100.0)
MONO ABS: 0.9 10*3/uL (ref 0.1–1.0)
Monocytes Relative: 13.8 % — ABNORMAL HIGH (ref 3.0–12.0)
Neutro Abs: 4.4 10*3/uL (ref 1.4–7.7)
Neutrophils Relative %: 71.8 % (ref 43.0–77.0)
Platelets: 142 10*3/uL — ABNORMAL LOW (ref 150.0–400.0)
RBC: 3.64 Mil/uL — ABNORMAL LOW (ref 4.22–5.81)
RDW: 13.7 % (ref 11.5–15.5)
WBC: 6.2 10*3/uL (ref 4.0–10.5)

## 2014-07-24 NOTE — Progress Notes (Signed)
[name], MD Phone: [phone]  Subjective:   [name] [name] is a 78 y.o. year old very pleasant male patient who presents with the following:  Chills/headache/fatigue Started yesterday. Woke up and felt like he had something in his throat. When he coughed it up, had slight blood tinge to it. No cough since that time and no coughing since that time. Felt tired throughout the day and had body aches. Had a headache all over head moderate level aching. Had some aching through his shoulders but had been doing some lifting on Monday which he normally doesn't do. No known sick contacts other than some at church and went on Wednesday for Bible Study. Does admit to mild shortness of breath yesterday but feels better today. Weight down 3 lbs over month. No swelling in legs. Does take a daily aspirin 325 mg.   ROS-no fevers. No confusion. No sore throat, no cough. Mild pain behind left ear. No constipation or diarrhea. No brbpr or melena.   Past Medical History- Patient Active Problem List   Diagnosis Date Noted  . Ischemic cardiomyopathy 04/13/2011    Priority: High  . CONGESTIVE HEART FAILURE 09/07/2008    Priority: High  . Type II or unspecified type diabetes mellitus with renal manifestations, uncontrolled(250.42) 10/31/2007    Priority: High  . CORONARY ARTERY DISEASE 09/25/2007    Priority: High  . BACK PAIN 03/20/2008    Priority: Medium  . Chronic renal disease, stage IV 10/31/2007    Priority: Medium  . HYPOTHYROIDISM 04/09/2007    Priority: Medium  . HYPERLIPIDEMIA 04/09/2007    Priority: Medium  . GOUT 04/09/2007    Priority: Medium  . HYPERTENSION 04/09/2007    Priority: Medium  . CEREBROVASCULAR ACCIDENT, HX OF 04/09/2007    Priority: Medium  . LOC OSTEOARTHROS NOT SPEC PRIM/SEC LOWER LEG 04/06/2010    Priority: Low  . LEG CRAMPS, IDIOPATHIC 04/06/2010    Priority: Low   Medications- reviewed and updated Current Outpatient Prescriptions  Medication Sig  Dispense Refill  . amLODipine (NORVASC) 5 MG tablet Take 1 tablet (5 mg total) by mouth 2 (two) times daily.  60 tablet  6  . calcitRIOL (ROCALTROL) 0.25 MCG capsule 0.25 mcg. Take one cap by mouth on odd days and 2 caps on even days.       . carvedilol (COREG) 12.5 MG tablet Take 1 tablet (12.5 mg total) by mouth 2 (two) times daily with a meal.  60 tablet  11  . chlorzoxazone (PARAFON) 500 MG tablet Take 1 tablet (500 mg total) by mouth 3 (three) times daily as needed for muscle spasms.  60 tablet  1  . colchicine 0.6 MG tablet Take 0.5 tablets (0.3 mg total) by mouth daily.      . furosemide (LASIX) 80 MG tablet Take 0.5 tablets (40 mg total) by mouth daily.  60 tablet  6  . glyBURIDE (DIABETA) 5 MG tablet Take 1/2 a pill before breakfast and full pill before 2nd meal of the day.  30 tablet  11  . hydrALAZINE (APRESOLINE) 50 MG tablet Take 100 mg by mouth 3 (three) times daily.       Marland Kitchen HYDROcodone-acetaminophen (NORCO/VICODIN) 5-325 MG per tablet Take 1 tablet by mouth every 6 (six) hours as needed for moderate pain.  60 tablet  0  . isosorbide mononitrate (IMDUR) 60 MG 24 hr tablet TAKE 1 TABLET BY MOUTH DAILY.  30 tablet  11  . levothyroxine (SYNTHROID) 200 MCG tablet Take 1  tablet (200 mcg total) by mouth daily.  90 tablet  1  . magnesium 30 MG tablet Take 30 mg by mouth 2 (two) times daily as needed (cramping in legs).       Marland Kitchen. olopatadine (PATANOL) 0.1 % ophthalmic solution Place 1 drop into both eyes 2 (two) times daily.  5 mL  3  . rosuvastatin (CRESTOR) 20 MG tablet Take 1 tablet (20 mg total) by mouth daily.  90 tablet  3  . acetaminophen (TYLENOL) 500 MG tablet Take 500 mg by mouth every 6 (six) hours as needed.      Marland Kitchen. aspirin 325 MG tablet Take 325 mg by mouth daily.        . nitroGLYCERIN (NITROSTAT) 0.4 MG SL tablet Place 1 tablet (0.4 mg total) under the tongue every 5 (five) minutes as needed for chest pain.  25 tablet  12   No current facility-administered medications for this  visit.    Objective: BP 120/60  Pulse 80  Temp(Src) 98.3 F (36.8 C)  Wt 181 lb (82.101 kg) Gen: NAD, resting comfortably HEENT: TM normal, oropharynx normal, no lymphadenopathy CV: RRR no murmurs rubs or gallops, no jvd Lungs: CTAB no crackles, wheeze, rhonchi specifically no crackles at bases Abdomen: soft/nontender/nondistended/normal bowel sounds.  Ext: no edema despite known CHF Skin: warm, dry Neuro: grossly normal, moves all extremities   Wt down 3 lbs from last visit  Results for orders placed in visit on 07/24/14 (from the past 24 hour(s))  COMPREHENSIVE METABOLIC PANEL     Status: Abnormal   Collection Time    07/24/14 11:39 AM      Result Value Ref Range   Sodium 137  135 - 145 mEq/L   Potassium 4.6  3.5 - 5.1 mEq/L   Chloride 103  96 - 112 mEq/L   CO2 23  19 - 32 mEq/L   Glucose, Bld 287 (*) 70 - 99 mg/dL   BUN 91 (*) 6 - 23 mg/dL   Creatinine, Ser 4.1 (*) 0.4 - 1.5 mg/dL   Total Bilirubin 0.6  0.2 - 1.2 mg/dL   Alkaline Phosphatase 32 (*) 39 - 117 U/L   AST 19  0 - 37 U/L   ALT 18  0 - 53 U/L   Total Protein 7.8  6.0 - 8.3 g/dL   Albumin 3.5  3.5 - 5.2 g/dL   Calcium 9.6  8.4 - 04.510.5 mg/dL   GFR 40.9818.46 (*) >11.91>60.00 mL/min   Narrative:    Critical result called to Nana on 07/24/2014 3:49 PM by Samuel JesterLeader, [place]. Results were read back to caller. (BUN)  CBC WITH DIFFERENTIAL     Status: Abnormal   Collection Time    07/24/14 11:39 AM      Result Value Ref Range   WBC 6.2  4.0 - 10.5 K/uL   RBC 3.64 (*) 4.22 - 5.81 Mil/uL   Hemoglobin 11.1 (*) 13.0 - 17.0 g/dL   HCT 47.834.2 (*) 29.539.0 - 62.152.0 %   MCV 93.9  78.0 - 100.0 fl   MCHC 32.5  30.0 - 36.0 g/dL   RDW 30.813.7  65.711.5 - 84.615.5 %   Platelets 142.0 (*) 150.0 - 400.0 K/uL   Neutrophils Relative % 71.8  43.0 - 77.0 %   Lymphocytes Relative 9.9 (*) 12.0 - 46.0 %   Monocytes Relative 13.8 (*) 3.0 - 12.0 %   Eosinophils Relative 4.0  0.0 - 5.0 %   Basophils Relative 0.5  0.0 - 3.0 %  Neutro Abs 4.4  1.4 - 7.7 K/uL     Lymphs Abs 0.6 (*) 0.7 - 4.0 K/uL   Monocytes Absolute 0.9  0.1 - 1.0 K/uL   Eosinophils Absolute 0.2  0.0 - 0.7 K/uL   Basophils Absolute 0.0  0.0 - 0.1 K/uL    Assessment/Plan:  Chills/headache/fatigue High risk patient with DM, CHF, and stage IV kidney disease. I suspect this is likely a viral syndrome/24 hour bug. Patient is already improving. Of abundance of precaution will check CBC and CMET. Due to slight hemoptysis check CBC. CBC came back with hemoglobin actually above baseline with stable but low platelets. CMET shows stable kidney function with CkD stage IV. Blood sugar is up which could be due to stress of illness. I doubt this is cardiac related to CHF as appears euvolemic and down 3 pounds. Asked patient to see me on Monday or Tuesday if he fails to continue to improve  Return precautions advised for sooner return care.  Orders Placed This Encounter  Procedures  . Comprehensive metabolic panel    [place]  . CBC with Differential

## 2014-07-24 NOTE — Telephone Encounter (Signed)
Per Laney PotashNana pt BUN was 9.1

## 2014-07-24 NOTE — Patient Instructions (Signed)
Check some labs today. Make sure blood counts haven't dropped and make sure infection fighting cells ok and kidney function stable.   Does not seem to be heart related, specifically no obvious signs increased fluid  I think you may have caught a viral bug that you are fighting off. See me on Monday or Tuesday if not better. Seek care sooner if symptoms worsen.   If that spot on your ear changes, we could always send you to dermatology

## 2014-08-17 ENCOUNTER — Telehealth: Payer: Self-pay | Admitting: Family Medicine

## 2014-08-17 NOTE — Telephone Encounter (Signed)
Is this ok Dr. Hunter? 

## 2014-08-17 NOTE — Telephone Encounter (Signed)
yes

## 2014-08-17 NOTE — Telephone Encounter (Signed)
[id number] - [place], [place] - [address] is requesting re-fill on glyBURIDE (DIABETA) 5 MG tablet.  Quantity should be 45 per 30 days, but quantity sent was 30 per 30 days.  **Take 1/2 a pill before breakfast and full pill before 2nd meal of the day.**

## 2014-08-18 MED ORDER — GLYBURIDE 5 MG PO TABS: ORAL_TABLET | ORAL | Status: DC

## 2014-08-18 NOTE — Telephone Encounter (Signed)
Medication refilled and qty changed to #45

## 2014-09-03 ENCOUNTER — Ambulatory Visit (INDEPENDENT_AMBULATORY_CARE_PROVIDER_SITE_OTHER): Payer: Medicare Other | Admitting: Family Medicine

## 2014-09-03 ENCOUNTER — Ambulatory Visit (INDEPENDENT_AMBULATORY_CARE_PROVIDER_SITE_OTHER)
Admission: RE | Admit: 2014-09-03 | Discharge: 2014-09-03 | Disposition: A | Discharge status: Home / Self Care | Payer: Medicare Other | Source: Ambulatory Visit | Attending: Family Medicine | Admitting: Family Medicine

## 2014-09-03 ENCOUNTER — Encounter: Payer: Self-pay | Admitting: Family Medicine

## 2014-09-03 VITALS — BP 100/60 | HR 71 | Temp 97.9°F | Wt 181.0 lb

## 2014-09-03 DIAGNOSIS — E780 Pure hypercholesterolemia: Secondary | ICD-10-CM

## 2014-09-03 DIAGNOSIS — R531 Weakness: Secondary | ICD-10-CM

## 2014-09-03 DIAGNOSIS — R0602 Shortness of breath: Secondary | ICD-10-CM

## 2014-09-03 LAB — COMPREHENSIVE METABOLIC PANEL
ALK PHOS: 33 U/L — AB (ref 39–117)
ALT: 13 U/L (ref 0–53)
AST: 16 U/L (ref 0–37)
Albumin: 3.8 g/dL (ref 3.5–5.2)
BILIRUBIN TOTAL: 0.7 mg/dL (ref 0.2–1.2)
BUN: 83 mg/dL — ABNORMAL HIGH (ref 6–23)
CO2: 24 mEq/L (ref 19–32)
Calcium: 9.2 mg/dL (ref 8.4–10.5)
Chloride: 102 mEq/L (ref 96–112)
Creatinine, Ser: 4.2 mg/dL — ABNORMAL HIGH (ref 0.4–1.5)
GFR: 17.65 mL/min — ABNORMAL LOW (ref >60.00)
Glucose, Bld: 309 mg/dL — ABNORMAL HIGH (ref 70–99)
POTASSIUM: 4.5 meq/L (ref 3.5–5.1)
SODIUM: 136 meq/L (ref 135–145)
TOTAL PROTEIN: 7.1 g/dL (ref 6.0–8.3)

## 2014-09-03 LAB — CBC WITH DIFFERENTIAL/PLATELET
BASOS ABS: 0 10*3/uL (ref 0.0–0.1)
Basophils Relative: 0 % (ref 0.0–3.0)
EOS ABS: 0.3 10*3/uL (ref 0.0–0.7)
Eosinophils Relative: 5.6 % — ABNORMAL HIGH (ref 0.0–5.0)
HCT: 34.5 % — ABNORMAL LOW (ref 39.0–52.0)
Hemoglobin: 11 g/dL — ABNORMAL LOW (ref 13.0–17.0)
LYMPHS PCT: 11.2 % — AB (ref 12.0–46.0)
Lymphs Abs: 0.6 10*3/uL — ABNORMAL LOW (ref 0.7–4.0)
MCHC: 32 g/dL (ref 30.0–36.0)
MCV: 95.3 fl (ref 78.0–100.0)
Monocytes Absolute: 0.7 10*3/uL (ref 0.1–1.0)
Monocytes Relative: 13.4 % — ABNORMAL HIGH (ref 3.0–12.0)
Neutro Abs: 3.9 10*3/uL (ref 1.4–7.7)
Neutrophils Relative %: 69.8 % (ref 43.0–77.0)
PLATELETS: 148 10*3/uL — AB (ref 150.0–400.0)
RBC: 3.62 Mil/uL — ABNORMAL LOW (ref 4.22–5.81)
RDW: 13.7 % (ref 11.5–15.5)
WBC: 5.6 10*3/uL (ref 4.0–10.5)

## 2014-09-03 LAB — BRAIN NATRIURETIC PEPTIDE: Pro B Natriuretic peptide (BNP): 125 pg/mL — ABNORMAL HIGH (ref 0.0–100.0)

## 2014-09-03 LAB — TROPONIN I: TNIDX: 0.01 ug/L (ref 0.00–0.06)

## 2014-09-03 NOTE — Patient Instructions (Addendum)
[name] [name]  Shortness of breath, weakness also- CMET, CBC diff, Dg CXR, EKG, troponin. Pro bnp. Labs stat.   [name] I will call tonight or tomorrow to make a plan with you EKG did not show any extreme abnormalities or atrial fibrillation.  I am worried about your kidneys-hopeful they are stable.  If your symptoms were to worsen, please call 911.  Take 1/2 a hydralazine 100mg  to see if that helps with your weakness.   I am hopeful that this is simply a cold/virus that is hard on you due to your underlying illness.

## 2014-09-03 NOTE — Progress Notes (Signed)
[name], MD Phone: [phone]  Subjective:   [name] [name] is a 78 y.o. year old very pleasant male patient who presents with the following:  Fatigue Shortness of Breath Starting Tuesday morning didn't feel like even putting on clothes/pajamas. Went to bend over and got very short of breath. Breath just walking across a room. No "get up" and go. No lightheadedness or dizziness. Has some sinus congestion as well as fullness in his ears but doesn't feel like a typical cold. No body aches. Normal urination. No confusion. ROS- no chest pain, nausea, sweating. No leg swelling. No low blood sugars. No orthopnea or PND. No leg swelling. No unilateral calf pain.   Past Medical History- Patient Active Problem List   Diagnosis Date Noted  . Ischemic cardiomyopathy 04/13/2011    Priority: High  . CONGESTIVE HEART FAILURE 09/07/2008    Priority: High  . Type II or unspecified type diabetes mellitus with renal manifestations, uncontrolled(250.42) 10/31/2007    Priority: High  . CORONARY ARTERY DISEASE 09/25/2007    Priority: High  . BACK PAIN 03/20/2008    Priority: Medium  . Chronic renal disease, stage IV 10/31/2007    Priority: Medium  . HYPOTHYROIDISM 04/09/2007    Priority: Medium  . HYPERLIPIDEMIA 04/09/2007    Priority: Medium  . GOUT 04/09/2007    Priority: Medium  . HYPERTENSION 04/09/2007    Priority: Medium  . CEREBROVASCULAR ACCIDENT, HX OF 04/09/2007    Priority: Medium  . LOC OSTEOARTHROS NOT SPEC PRIM/SEC LOWER LEG 04/06/2010    Priority: Low  . LEG CRAMPS, IDIOPATHIC 04/06/2010    Priority: Low   Medications- reviewed and updated Current Outpatient Prescriptions  Medication Sig Dispense Refill  . acetaminophen (TYLENOL) 500 MG tablet Take 500 mg by mouth every 6 (six) hours as needed.    Marland Kitchen amLODipine (NORVASC) 5 MG tablet Take 1 tablet (5 mg total) by mouth 2 (two) times daily. 60 tablet 6  . aspirin 325 MG tablet Take 325 mg by mouth daily.      .  calcitRIOL (ROCALTROL) 0.25 MCG capsule 0.25 mcg. Take one cap by mouth on odd days and 2 caps on even days.     . carvedilol (COREG) 12.5 MG tablet Take 1 tablet (12.5 mg total) by mouth 2 (two) times daily with a meal. 60 tablet 11  . chlorzoxazone (PARAFON) 500 MG tablet Take 1 tablet (500 mg total) by mouth 3 (three) times daily as needed for muscle spasms. 60 tablet 1  . colchicine 0.6 MG tablet Take 0.5 tablets (0.3 mg total) by mouth daily.    . furosemide (LASIX) 80 MG tablet Take 0.5 tablets (40 mg total) by mouth daily. 60 tablet 6  . glyBURIDE (DIABETA) 5 MG tablet Take 1/2 a pill before breakfast and full pill before 2nd meal of the day. 45 tablet 3  . hydrALAZINE (APRESOLINE) 100 MG tablet Take 100 mg by mouth 3 (three) times daily.  6  . HYDROcodone-acetaminophen (NORCO/VICODIN) 5-325 MG per tablet Take 1 tablet by mouth every 6 (six) hours as needed for moderate pain. 60 tablet 0  . isosorbide mononitrate (IMDUR) 60 MG 24 hr tablet TAKE 1 TABLET BY MOUTH DAILY. 30 tablet 11  . levothyroxine (SYNTHROID) 200 MCG tablet Take 1 tablet (200 mcg total) by mouth daily. 90 tablet 1  . magnesium 30 MG tablet Take 30 mg by mouth 2 (two) times daily as needed (cramping in legs).     Marland Kitchen olopatadine (PATANOL) 0.1 %  ophthalmic solution Place 1 drop into both eyes 2 (two) times daily. 5 mL 3  . rosuvastatin (CRESTOR) 20 MG tablet Take 1 tablet (20 mg total) by mouth daily. 90 tablet 3  . nitroGLYCERIN (NITROSTAT) 0.4 MG SL tablet Place 1 tablet (0.4 mg total) under the tongue every 5 (five) minutes as needed for chest pain. 25 tablet 12   No current facility-administered medications for this visit.    Objective: BP 100/60 mmHg  Pulse 71  Temp(Src) 97.9 F (36.6 C) (Oral)  Wt 181 lb (82.101 kg)  SpO2 97% Gen: resting comfortably in chair, no signs of distress CV: RRR no murmurs rubs or gallops, no ectopic beats. No JVD.  Lungs: CTAB no crackles, wheeze, rhonchi. No respiratory distress or  increased RR.  Abdomen: soft/nontender/nondistended/normal bowel sounds.  Ext: no edema, no calf swelling or tenderness Skin: warm, dry, no rash Neuro: walks out of office without difficulty   Results for orders placed or performed in visit on 09/03/14 (from the past 24 hour(s))  CBC with Differential     Status: Abnormal   Collection Time: 09/03/14  1:37 PM  Result Value Ref Range   WBC 5.6 4.0 - 10.5 K/uL   RBC 3.62 (L) 4.22 - 5.81 Mil/uL   Hemoglobin 11.0 (L) 13.0 - 17.0 g/dL   HCT 16.134.5 (L) 09.639.0 - 04.552.0 %   MCV 95.3 78.0 - 100.0 fl   MCHC 32.0 30.0 - 36.0 g/dL   RDW 40.913.7 81.111.5 - 91.415.5 %   Platelets 148.0 (L) 150.0 - 400.0 K/uL   Neutrophils Relative % 69.8 43.0 - 77.0 %   Lymphocytes Relative 11.2 (L) 12.0 - 46.0 %   Monocytes Relative 13.4 (H) 3.0 - 12.0 %   Eosinophils Relative 5.6 (H) 0.0 - 5.0 %   Basophils Relative 0.0 0.0 - 3.0 %   Neutro Abs 3.9 1.4 - 7.7 K/uL   Lymphs Abs 0.6 (L) 0.7 - 4.0 K/uL   Monocytes Absolute 0.7 0.1 - 1.0 K/uL   Eosinophils Absolute 0.3 0.0 - 0.7 K/uL   Basophils Absolute 0.0 0.0 - 0.1 K/uL  Troponin I     Status: None   Collection Time: 09/03/14  1:37 PM  Result Value Ref Range   TNIDX 0.01 0.00 - 0.06 ug/l  Brain natriuretic peptide     Status: Abnormal   Collection Time: 09/03/14  1:37 PM  Result Value Ref Range   Pro B Natriuretic peptide (BNP) 125.0 (H) 0.0 - 100.0 pg/mL  Comprehensive metabolic panel     Status: Abnormal   Collection Time: 09/03/14  1:37 PM  Result Value Ref Range   Sodium 136 135 - 145 mEq/L   Potassium 4.5 3.5 - 5.1 mEq/L   Chloride 102 96 - 112 mEq/L   CO2 24 19 - 32 mEq/L   Glucose, Bld 309 (H) 70 - 99 mg/dL   BUN 83 (H) 6 - 23 mg/dL   Creatinine, Ser 4.2 (H) 0.4 - 1.5 mg/dL   Total Bilirubin 0.7 0.2 - 1.2 mg/dL   Alkaline Phosphatase 33 (L) 39 - 117 U/L   AST 16 0 - 37 U/L   ALT 13 0 - 53 U/L   Total Protein 7.1 6.0 - 8.3 g/dL   Albumin 3.8 3.5 - 5.2 g/dL   Calcium 9.2 8.4 - 78.210.5 mg/dL   GFR 95.6217.65 (L)  >13.08>60.00 mL/min   Dg Chest 2 View  09/03/2014   CLINICAL DATA:  Three-day history of dyspnea on exertion; history  of asthma, coronary artery disease, CHF, and hypertension.  EXAM: CHEST  2 VIEW  COMPARISON:  PA and lateral chest of September 05, 2013  FINDINGS: The lungs are adequately inflated. The infrahilar lung markings are coarse on the right and are not new but are more conspicuous today. The cardiac silhouette is mildly enlarged. The central pulmonary vascularity is prominent. There is no pleural effusion or pneumothorax. The trachea is midline. The bony thorax is unremarkable.  IMPRESSION: There is low-grade compensated CHF. Superimposed upon this is subsegmental atelectasis in the right infrahilar region posteriorly. There is no alveolar pneumonia.   Electronically Signed   By: David  [place]   On: 09/03/2014 15:01   EKG- sinus rhythm with first degree av block and one PVC. Normal intervals except PR. No hypertrophy. Nonspecific st t wave changes-none concerning for MI or acute event.   Assessment/Plan:  Shortness of breath/Fatigue My primary concern was acute renal failure but labs show stable creatinine around 4 with GFR still stage IV. CBG is up to 309 which could indicate stress or infection but there is no WBC elevation. CBG elevation could cause fatigue but I would not expect Shortness of breath. I asked patient to take his glyburide at 5mg  BID for time being instead of 7.5mg  for day. CBC does not show worsening anemia to account for fatigue. I was concerned about a fib (stated Dr. Briant CedarMattingly had mentioned to him irregular heart beat once) but not detected on exam or EKG. Ekg reassuring as well as troponin. CXR mentions compensated HF and exam did not show signs of fluid overload and his weight was stable. Doubt flu as no body aches and afebrile.   I discussed all the results with the patient by phone and told him they were very reassuring. He states he is already feeling less short of breath  and fatigued this evening. I really wonder if this was a minor illness that just knocked him back given comorbid conditions. He is going to check in with me early next week unless his symptoms worsen. I discussed with him reasons to see us tomorrow or be evaluated over the weekend. With his blood pressure slightly lower, I did have him take 50mg  TID of hydralazine TID and it is possible his BP running slightly lower could have caused his symptoms.   He also has some paperwork he would like for me to review next week when I see him.

## 2014-09-16 ENCOUNTER — Telehealth: Payer: Self-pay | Admitting: Family Medicine

## 2014-09-16 MED ORDER — LEVOTHYROXINE SODIUM 200 MCG PO TABS: 200.0000 ug | ORAL_TABLET | Freq: Every day | ORAL | Status: DC

## 2014-09-16 NOTE — Telephone Encounter (Signed)
Medication refilled

## 2014-09-16 NOTE — Telephone Encounter (Signed)
[id number] - [place], [place] - [id number] [phone] is requesting re-fill on levothyroxine (SYNTHROID) 200 MCG tablet

## 2014-09-18 ENCOUNTER — Encounter: Payer: Self-pay | Admitting: Family Medicine

## 2014-09-18 ENCOUNTER — Ambulatory Visit (INDEPENDENT_AMBULATORY_CARE_PROVIDER_SITE_OTHER): Payer: Medicare Other | Admitting: Family Medicine

## 2014-09-18 VITALS — BP 128/78 | HR 66 | Temp 98.2°F | Wt 185.0 lb

## 2014-09-18 DIAGNOSIS — N189 Chronic kidney disease, unspecified: Secondary | ICD-10-CM

## 2014-09-18 DIAGNOSIS — M62838 Other muscle spasm: Secondary | ICD-10-CM

## 2014-09-18 DIAGNOSIS — E1122 Type 2 diabetes mellitus with diabetic chronic kidney disease: Secondary | ICD-10-CM

## 2014-09-18 MED ORDER — HYDROCODONE-ACETAMINOPHEN 5-325 MG PO TABS: 1.0000 | ORAL_TABLET | Freq: Four times a day (QID) | ORAL | Status: DC | PRN

## 2014-09-18 MED ORDER — GLIPIZIDE 5 MG PO TABS: ORAL_TABLET | ORAL | Status: DC

## 2014-09-18 MED ORDER — HYDRALAZINE HCL 50 MG PO TABS: 50.0000 mg | ORAL_TABLET | Freq: Three times a day (TID) | ORAL | Status: DC

## 2014-09-18 MED ORDER — CHLORZOXAZONE 500 MG PO TABS: 500.0000 mg | ORAL_TABLET | Freq: Three times a day (TID) | ORAL | Status: DC | PRN

## 2014-09-18 NOTE — Assessment & Plan Note (Signed)
Check a1c today. Thankful patient without hypoglycemia on glyburide 2.5mg  AM and 5mg  PM. We will change to glipizide to try to mimic this as this is on covered formulary for 2016. Follow up in 3 months. Want to avoid hypoglycemia but also want to stay relatively close to 7 given renal disease.

## 2014-09-18 NOTE — Patient Instructions (Signed)
A1c today, goal to stay right around 7 without any highs or lows  Send you to a new eye doctor  Continue hydralazine 50mg  instead of 100mg . Blood pressure looks pretty good at least today.

## 2014-09-18 NOTE — Progress Notes (Signed)
Tana ConchStephen Hunter, MD Phone: [phone]  Subjective:   [name] [name] is a 78 y.o. year old very pleasant male patient who presents with the following:  DIABETES Type II-reasonable control given age  Lab Results  Component Value Date   HGBA1C 7.1* 06/19/2014   HGBA1C 6.4 02/16/2014   HGBA1C 6.5* 10/03/2013  Medications taking and tolerating-yes, glyburide 1/2 tab in AM full at night of 5mg .  His plan will not cover glyburide starting January 1st but will cover glipizide and glimepiride.  Blood Sugars per patient-fasting- before thanksgiving was low 100s On Aspirin-yes On statin-yes Daily foot monitoring-yes Health Maintenance Due  Topic Date Due  . OPHTHALMOLOGY EXAM -refer today 11/18/2011   ROS- nocturia 3-4x a night. No polyuria in daytime. No hypoglycemia.  Past Medical History- Patient Active Problem List   Diagnosis Date Noted  . Ischemic cardiomyopathy 04/13/2011    Priority: High  . CONGESTIVE HEART FAILURE 09/07/2008    Priority: High  . Type II diabetes mellitus with renal manifestations 10/31/2007    Priority: High  . CORONARY ARTERY DISEASE 09/25/2007    Priority: High  . BACK PAIN 03/20/2008    Priority: Medium  . Chronic renal disease, stage IV 10/31/2007    Priority: Medium  . HYPOTHYROIDISM 04/09/2007    Priority: Medium  . HYPERLIPIDEMIA 04/09/2007    Priority: Medium  . GOUT 04/09/2007    Priority: Medium  . HYPERTENSION 04/09/2007    Priority: Medium  . CEREBROVASCULAR ACCIDENT, HX OF 04/09/2007    Priority: Medium  . LOC OSTEOARTHROS NOT SPEC PRIM/SEC LOWER LEG 04/06/2010    Priority: Low  . LEG CRAMPS, IDIOPATHIC 04/06/2010    Priority: Low   Medications- reviewed and updated Current Outpatient Prescriptions  Medication Sig Dispense Refill  . amLODipine (NORVASC) 5 MG tablet Take 1 tablet (5 mg total) by mouth 2 (two) times daily. 60 tablet 6  . aspirin 325 MG tablet Take 325 mg by mouth daily.      . calcitRIOL (ROCALTROL) 0.25 MCG  capsule 0.25 mcg. Take one cap by mouth on odd days and 2 caps on even days.     . carvedilol (COREG) 12.5 MG tablet Take 1 tablet (12.5 mg total) by mouth 2 (two) times daily with a meal. 60 tablet 11  . colchicine 0.6 MG tablet Take 0.5 tablets (0.3 mg total) by mouth daily.    . furosemide (LASIX) 80 MG tablet Take 0.5 tablets (40 mg total) by mouth daily. 60 tablet 6  . isosorbide mononitrate (IMDUR) 60 MG 24 hr tablet TAKE 1 TABLET BY MOUTH DAILY. 30 tablet 11  . levothyroxine (SYNTHROID) 200 MCG tablet Take 1 tablet (200 mcg total) by mouth daily. 90 tablet 1  . rosuvastatin (CRESTOR) 20 MG tablet Take 1 tablet (20 mg total) by mouth daily. 90 tablet 3  . acetaminophen (TYLENOL) 500 MG tablet Take 500 mg by mouth every 6 (six) hours as needed.    . chlorzoxazone (PARAFON) 500 MG tablet Take 1 tablet (500 mg total) by mouth 3 (three) times daily as needed for muscle spasms. 60 tablet 1  . glipiZIDE (GLUCOTROL) 5 MG tablet Take 1/2 tab before breakfast and full tab before dinner 150 tablet 3  . hydrALAZINE (APRESOLINE) 50 MG tablet Take 1 tablet (50 mg total) by mouth 3 (three) times daily. 270 tablet 3  . HYDROcodone-acetaminophen (NORCO/VICODIN) 5-325 MG per tablet Take 1 tablet by mouth every 6 (six) hours as needed for moderate pain. 60 tablet 0  .  magnesium 30 MG tablet Take 30 mg by mouth 2 (two) times daily as needed (cramping in legs).     . nitroGLYCERIN (NITROSTAT) 0.4 MG SL tablet Place 1 tablet (0.4 mg total) under the tongue every 5 (five) minutes as needed for chest pain. 25 tablet 12  . olopatadine (PATANOL) 0.1 % ophthalmic solution Place 1 drop into both eyes 2 (two) times daily. (Patient not taking: Reported on 09/18/2014) 5 mL 3   No current facility-administered medications for this visit.    Objective: BP 128/78 mmHg  Pulse 66  Temp(Src) 98.2 F (36.8 C)  Wt 185 lb (83.915 kg) Gen: NAD, resting comfortably in chair Manual recheck BP at 128/78 CV: RRR no murmurs rubs  or gallops Lungs: CTAB no crackles, wheeze, rhonchi Abdomen: soft/nontender/nondistended/normal bowel sounds.  Ext: no edema Skin: warm, dry, no rash   Assessment/Plan:  Type II diabetes mellitus with renal manifestations Check a1c today. Thankful patient without hypoglycemia on glyburide 2.5mg  AM and 5mg  PM. We will change to glipizide to try to mimic this as this is on covered formulary for 2016. Follow up in 3 months. Want to avoid hypoglycemia but also want to stay relatively close to 7 given renal disease.   3-4 month follow up  Orders Placed This Encounter  Procedures  . Hemoglobin A1c    [place]  . Ambulatory referral to Ophthalmology    Referral Priority:  Routine    Referral Type:  Consultation    Referral Reason:  Specialty Services Required    Requested Specialty:  Ophthalmology    Number of Visits Requested:  1   Doing well on hydralazine 50mg  so continue at this dose instead of 100mg . Need to keep BP <130/90 given CKD Meds ordered this encounter  Medications  . hydrALAZINE (APRESOLINE) 50 MG tablet    Sig: Take 1 tablet (50 mg total) by mouth 3 (three) times daily.    Dispense:  270 tablet    Refill:  3  . HYDROcodone-acetaminophen (NORCO/VICODIN) 5-325 MG per tablet    Sig: Take 1 tablet by mouth every 6 (six) hours as needed for moderate pain.    Dispense:  60 tablet    Refill:  0  . chlorzoxazone (PARAFON) 500 MG tablet    Sig: Take 1 tablet (500 mg total) by mouth 3 (three) times daily as needed for muscle spasms.    Dispense:  60 tablet    Refill:  1  . glipiZIDE (GLUCOTROL) 5 MG tablet    Sig: Take 1/2 tab before breakfast and full tab before dinner    Dispense:  150 tablet    Refill:  3    Starting 2016 due to change in formulary from glymeperide.

## 2014-09-20 LAB — HEMOGLOBIN A1C: HEMOGLOBIN A1C: 7.7 % — AB (ref 4.6–6.5)

## 2014-10-19 ENCOUNTER — Telehealth: Payer: Self-pay | Admitting: Family Medicine

## 2014-10-19 ENCOUNTER — Encounter: Payer: Self-pay | Admitting: Family Medicine

## 2014-10-19 ENCOUNTER — Ambulatory Visit (INDEPENDENT_AMBULATORY_CARE_PROVIDER_SITE_OTHER): Payer: Medicare Other | Admitting: Family Medicine

## 2014-10-19 VITALS — BP 102/52 | Temp 97.7°F | Wt 182.0 lb

## 2014-10-19 DIAGNOSIS — M170 Bilateral primary osteoarthritis of knee: Secondary | ICD-10-CM

## 2014-10-19 MED ORDER — METHYLPREDNISOLONE ACETATE 80 MG/ML IJ SUSP: 80.0000 mg | Freq: Once | INTRAMUSCULAR | Status: DC

## 2014-10-19 MED ORDER — METHYLPREDNISOLONE ACETATE 80 MG/ML IJ SUSP
160.0000 mg | Freq: Once | INTRAMUSCULAR | Status: AC
  Administered 2014-10-19: 160 mg via INTRA_ARTICULAR

## 2014-10-19 NOTE — Telephone Encounter (Signed)
Pt states dr hunter advise him he could do inj in his knee. Pt's knee is very swollen and would like the injection. Is that OK? Pt coming in today.

## 2014-10-19 NOTE — Patient Instructions (Signed)
Pain may worsen after a few hours then improve after 24 hours or so. May want to ice it, wrap it, rest it tonight.   Knee Injection Joint injections are shots. Your caregiver will place a needle into your knee joint. The needle is used to put medicine into the joint. These shots can be used to help treat different painful knee conditions such as osteoarthritis, bursitis, local flare-ups of rheumatoid arthritis, and pseudogout. Anti-inflammatory medicines such as corticosteroids and anesthetics are the most common medicines used for joint and soft tissue injections.  PROCEDURE  The skin over the kneecap will be cleaned with an antiseptic solution.  Your caregiver will inject a small amount of a local anesthetic (a medicine like Novocaine) just under the skin in the area that was cleaned.  After the area becomes numb, a second injection is done. This second injection usually includes an anesthetic and an anti-inflammatory medicine called a steroid or cortisone. The needle is carefully placed in between the kneecap and the knee, and the medicine is injected into the joint space.  After the injection is done, the needle is removed. Your caregiver may place a bandage over the injection site. The whole procedure takes no more than a couple of minutes. BEFORE THE PROCEDURE  Wash all of the skin around the entire knee area. Try to remove any loose, scaling skin. There is no other specific preparation necessary unless advised otherwise by your caregiver. LET YOUR CAREGIVER KNOW ABOUT:   Allergies.  Medications taken including herbs, eye drops, over the counter medications, and creams.  Use of steroids (by mouth or creams).  Possible pregnancy, if applicable.  Previous problems with anesthetics or Novocaine.  History of blood clots (thrombophlebitis).  History of bleeding or blood problems.  Previous surgery.  Other health problems. RISKS AND COMPLICATIONS Side effects from cortisone shots  are rare. They include:   Slight bruising of the skin.  Shrinkage of the normal fatty tissue under the skin where the shot was given.  Increase in pain after the shot.  Infection.  Weakening of tendons or tendon rupture.  Allergic reaction to the medicine.  Diabetics may have a temporary increase in their blood sugar after a shot.  Cortisone can temporarily weaken the immune system. While receiving these shots, you should not get certain vaccines. Also, avoid contact with anyone who has chickenpox or measles. Especially if you have never had these diseases or have not been previously immunized. Your immune system may not be strong enough to fight off the infection while the cortisone is in your system. AFTER THE PROCEDURE   You can go home after the procedure.  You may need to put ice on the joint 15-20 minutes every 3 or 4 hours until the pain goes away.  You may need to put an elastic bandage on the joint. HOME CARE INSTRUCTIONS   You should avoid stressing the joint. Unless advised otherwise, avoid activities that put a lot of pressure on a knee joint, such as:  Jogging.  Bicycling.  Recreational climbing.  Hiking.  Laying down and elevating the leg/knee above the level of your heart can help to minimize swelling. SEEK MEDICAL CARE IF:   You have repeated or worsening swelling.  There is drainage from the puncture area.  You develop red streaking that extends above or below the site where the needle was inserted. SEEK IMMEDIATE MEDICAL CARE IF:   You develop a fever.  You have pain that gets worse even though  you are taking pain medicine.  The area is red and warm, and you have trouble moving the joint. MAKE SURE YOU:   Understand these instructions.  Will watch your condition.  Will get help right away if you are not doing well or get worse. Document Released: 12/24/2006 Document Revised: 12/25/2011 Document Reviewed: 09/20/2007 [facility] Patient  Information 2015 [place], [place]. This information is not intended to replace advice given to you by your health care provider. Make sure you discuss any questions you have with your health care provider.

## 2014-10-19 NOTE — Progress Notes (Signed)
[name], MD Phone: [phone]  Subjective:   [name] [name] is a 79 y.o. year old very pleasant male patient who presents with the following:  Osteoarthritis of knees Has been told Cannot operate on it due to kidneys and has been told bone on bone. Pain has been worsening in recent weeks. Injections have helped in the past. 8/10 pain yesterday. Last injection was May 2015. He requests another injection. L > R pain.  ROS- no locking or giving way. No redness. Some swelling at knees.   Past Medical History- Patient Active Problem List   Diagnosis Date Noted  . Ischemic cardiomyopathy 04/13/2011    Priority: High  . CONGESTIVE HEART FAILURE 09/07/2008    Priority: High  . Type II diabetes mellitus with renal manifestations 10/31/2007    Priority: High  . CORONARY ARTERY DISEASE 09/25/2007    Priority: High  . BACK PAIN 03/20/2008    Priority: Medium  . Chronic renal disease, stage IV 10/31/2007    Priority: Medium  . HYPOTHYROIDISM 04/09/2007    Priority: Medium  . HYPERLIPIDEMIA 04/09/2007    Priority: Medium  . GOUT 04/09/2007    Priority: Medium  . HYPERTENSION 04/09/2007    Priority: Medium  . CEREBROVASCULAR ACCIDENT, HX OF 04/09/2007    Priority: Medium  . LOC OSTEOARTHROS NOT SPEC PRIM/SEC LOWER LEG 04/06/2010    Priority: Low  . LEG CRAMPS, IDIOPATHIC 04/06/2010    Priority: Low   Medications- reviewed and updated Current Outpatient Prescriptions  Medication Sig Dispense Refill  . amLODipine (NORVASC) 5 MG tablet Take 1 tablet (5 mg total) by mouth 2 (two) times daily. 60 tablet 6  . aspirin 325 MG tablet Take 325 mg by mouth daily.      . calcitRIOL (ROCALTROL) 0.25 MCG capsule 0.25 mcg. Take one cap by mouth on odd days and 2 caps on even days.     . carvedilol (COREG) 12.5 MG tablet Take 1 tablet (12.5 mg total) by mouth 2 (two) times daily with a meal. 60 tablet 11  . colchicine 0.6 MG tablet Take 0.5 tablets (0.3 mg total) by mouth daily.    .  furosemide (LASIX) 80 MG tablet Take 0.5 tablets (40 mg total) by mouth daily. 60 tablet 6  . glipiZIDE (GLUCOTROL) 5 MG tablet Take 1/2 tab before breakfast and full tab before dinner 150 tablet 3  . hydrALAZINE (APRESOLINE) 50 MG tablet Take 1 tablet (50 mg total) by mouth 3 (three) times daily. 270 tablet 3  . isosorbide mononitrate (IMDUR) 60 MG 24 hr tablet TAKE 1 TABLET BY MOUTH DAILY. 30 tablet 11  . levothyroxine (SYNTHROID) 200 MCG tablet Take 1 tablet (200 mcg total) by mouth daily. 90 tablet 1  . magnesium 30 MG tablet Take 30 mg by mouth 2 (two) times daily as needed (cramping in legs).     Marland Kitchen olopatadine (PATANOL) 0.1 % ophthalmic solution Place 1 drop into both eyes 2 (two) times daily. 5 mL 3  . rosuvastatin (CRESTOR) 20 MG tablet Take 1 tablet (20 mg total) by mouth daily. 90 tablet 3  . acetaminophen (TYLENOL) 500 MG tablet Take 500 mg by mouth every 6 (six) hours as needed.    . chlorzoxazone (PARAFON) 500 MG tablet Take 1 tablet (500 mg total) by mouth 3 (three) times daily as needed for muscle spasms. (Patient not taking: Reported on 10/19/2014) 60 tablet 1  . HYDROcodone-acetaminophen (NORCO/VICODIN) 5-325 MG per tablet Take 1 tablet by mouth every 6 (six)  hours as needed for moderate pain. (Patient not taking: Reported on 10/19/2014) 60 tablet 0  . nitroGLYCERIN (NITROSTAT) 0.4 MG SL tablet Place 1 tablet (0.4 mg total) under the tongue every 5 (five) minutes as needed for chest pain. 25 tablet 12   No current facility-administered medications for this visit.    Objective: BP 102/52 mmHg  Temp(Src) 97.7 F (36.5 C)  Wt 182 lb (82.555 kg) Gen: NAD, resting comfortably  Knee: inspection reveals no erythema. Small effusion on left. No effusion on right. No obvious bony abnormalities. Palpation normal with line tenderness. No patellar tenderness or condyle tenderness. ROM normal in flexion and extension and lower leg rotation. Ligaments with solid consistent endpoints  including ACL, PCL, LCL, MCL. Negative Mcmurray's. Non painful patellar compression. Patellar and quadriceps tendons unremarkable. Hamstring and quadriceps strength is normal.   Assessment/Plan:  Osteoarthritis severe bilateral knees Bilateral knee injections today. See note. Follow up prn for this issue.   Return precautions advised.   Procedure Note Consent obtained and verified. Sterile betadine prep. Furthur cleansed with alcohol. Topical analgesic spray: Ethyl chloride. Joint: bilateral knee Approached in typical fashion with: antero medial approach Completed without difficulty Meds: 1 cc /ml depo medrol with 4 cc of lidocaine no epinephrine on right knee 1 cc /ml depo medrol with 4 cc of lidocaine no epinephrine on left knee Needle: 25 gauge 1 1/2.  Aftercare instructions and Red flags advised.  Meds ordered this encounter  . methylPREDNISolone acetate (DEPO-MEDROL) injection 160 mg

## 2014-10-19 NOTE — Assessment & Plan Note (Signed)
Bilateral knee injections today. See note. Follow up prn for this issue.

## 2014-10-22 ENCOUNTER — Telehealth: Payer: Self-pay

## 2014-10-22 DIAGNOSIS — N183 Chronic kidney disease, stage 3 unspecified: Secondary | ICD-10-CM

## 2014-10-22 MED ORDER — FUROSEMIDE 80 MG PO TABS: 40.0000 mg | ORAL_TABLET | Freq: Every day | ORAL | Status: DC

## 2014-10-22 NOTE — Telephone Encounter (Signed)
Rx request for Furosemide 80 mg tablet- Take 1 tablet by mouth in the morning and 1/2 table at night #60  Pharm:  [facility]  Pls advise

## 2014-10-22 NOTE — Telephone Encounter (Signed)
Medication refilled

## 2014-11-02 LAB — HM DIABETES EYE EXAM

## 2014-11-04 ENCOUNTER — Encounter: Payer: Self-pay | Admitting: Family Medicine

## 2014-11-14 NOTE — Progress Notes (Signed)
HPI: FU coronary artery disease. His most recent catheterization was in March 2005 and showed a 40% left main, no significant disease in the circumflex, total occlusion of the ramus branch with a 50% first marginal, 40% proximal, followed by 95% mid, and 90% mid distal stenosis in the right coronary artery. The patient had successful PCI of the right coronary artery both the mid and mid-to- distal right lesions. Echocardiogram in March of 2012 showed an ejection fraction of 35%, mild mitral regurgitation, mildly dilated aortic root, mild right atrial and right ventricular enlargement. Nuclear study in November of 2014 showed no ischemia or infarction and an ejection fraction of 76%. Since last seen, the patient denies any dyspnea on exertion, orthopnea, PND, pedal edema, palpitations, syncope or chest pain.   Current Outpatient Prescriptions  Medication Sig Dispense Refill  . acetaminophen (TYLENOL) 500 MG tablet Take 500 mg by mouth every 6 (six) hours as needed.    Marland Kitchen. amLODipine (NORVASC) 5 MG tablet Take 1 tablet (5 mg total) by mouth 2 (two) times daily. 60 tablet 6  . aspirin 325 MG tablet Take 325 mg by mouth daily.      . calcitRIOL (ROCALTROL) 0.25 MCG capsule 0.25 mcg. Take one cap by mouth on odd days and 2 caps on even days.     . carvedilol (COREG) 12.5 MG tablet Take 1 tablet (12.5 mg total) by mouth 2 (two) times daily with a meal. 60 tablet 11  . chlorzoxazone (PARAFON) 500 MG tablet Take 1 tablet (500 mg total) by mouth 3 (three) times daily as needed for muscle spasms. 60 tablet 1  . colchicine 0.6 MG tablet Take 0.5 tablets (0.3 mg total) by mouth daily.    . furosemide (LASIX) 80 MG tablet Take 0.5 tablets (40 mg total) by mouth daily. 60 tablet 2  . glipiZIDE (GLUCOTROL) 5 MG tablet Take 1/2 tab before breakfast and full tab before dinner 150 tablet 3  . hydrALAZINE (APRESOLINE) 50 MG tablet Take 1 tablet (50 mg total) by mouth 3 (three) times daily. 270 tablet 3  .  HYDROcodone-acetaminophen (NORCO/VICODIN) 5-325 MG per tablet Take 1 tablet by mouth every 6 (six) hours as needed for moderate pain. 60 tablet 0  . isosorbide mononitrate (IMDUR) 60 MG 24 hr tablet TAKE 1 TABLET BY MOUTH DAILY. 30 tablet 11  . levothyroxine (SYNTHROID) 200 MCG tablet Take 1 tablet (200 mcg total) by mouth daily. 90 tablet 1  . magnesium 30 MG tablet Take 30 mg by mouth 2 (two) times daily as needed (cramping in legs).     Marland Kitchen. olopatadine (PATANOL) 0.1 % ophthalmic solution Place 1 drop into both eyes 2 (two) times daily. 5 mL 3  . rosuvastatin (CRESTOR) 20 MG tablet Take 1 tablet (20 mg total) by mouth daily. 90 tablet 3  . nitroGLYCERIN (NITROSTAT) 0.4 MG SL tablet Place 1 tablet (0.4 mg total) under the tongue every 5 (five) minutes as needed for chest pain. 25 tablet 12   No current facility-administered medications for this visit.     Past Medical History  Diagnosis Date  . CAD (coronary artery disease)     s/p PCI to mid and mid-distal RCA, 40% sten L main, no sig dz LCx   . HTN (hypertension)   . Hyperlipidemia   . Cerebrovascular accident   . CHF (congestive heart failure)     EF 35% echo 2012 ; EF 44% myoview 2012; [facility]  .  Diabetes mellitus   . 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 Unspecified hypothyroidism   . Gout     Past Surgical History  Procedure Laterality Date  . Thyroidectomy      History   Social History  . Marital Status: Married    Spouse Name: N/A    Number of Children: N/A  . Years of Education: N/A   Occupational History  . Not on file.   Social History Main Topics  . Smoking status: Former Smoker -- 0.50 packs/day    Types: Cigarettes    Quit date: 10/16/1953  . Smokeless tobacco: Not on file     Comment: quit in the 70's  . Alcohol Use: No  . Drug Use: No  . Sexual Activity: Not Currently   Other Topics Concern  . Not on file   Social History Narrative   Lives in [place] with his  wife and has 6 kids.     ROS: no fevers or chills, productive cough, hemoptysis, dysphasia, odynophagia, melena, hematochezia, dysuria, hematuria, rash, seizure activity, orthopnea, PND, pedal edema, claudication. Remaining systems are negative.  Physical Exam: Well-developed well-nourished in no acute distress.  Skin is warm and dry.  HEENT is normal.  Neck is supple.  Chest is clear to auscultation with normal expansion.  Cardiovascular exam is regular rate and rhythm.  Abdominal exam nontender or distended. No masses palpated. Extremities show no edema. neuro grossly intact  ECG 09/03/14 sinus rhythm, first-degree AV block, PVCs, nonspecific T-wave changes.

## 2014-11-16 ENCOUNTER — Ambulatory Visit (INDEPENDENT_AMBULATORY_CARE_PROVIDER_SITE_OTHER): Payer: Medicare Other | Admitting: Cardiology

## 2014-11-16 ENCOUNTER — Encounter: Payer: Self-pay | Admitting: Cardiology

## 2014-11-16 VITALS — BP 130/60 | HR 60 | Ht 69.0 in | Wt 186.3 lb

## 2014-11-16 DIAGNOSIS — I10 Essential (primary) hypertension: Secondary | ICD-10-CM

## 2014-11-16 DIAGNOSIS — N183 Chronic kidney disease, stage 3 unspecified: Secondary | ICD-10-CM

## 2014-11-16 DIAGNOSIS — R42 Dizziness and giddiness: Secondary | ICD-10-CM

## 2014-11-16 DIAGNOSIS — I255 Ischemic cardiomyopathy: Secondary | ICD-10-CM

## 2014-11-16 MED ORDER — AMLODIPINE BESYLATE 5 MG PO TABS: 5.0000 mg | ORAL_TABLET | Freq: Two times a day (BID) | ORAL | Status: DC

## 2014-11-16 MED ORDER — FUROSEMIDE 80 MG PO TABS: 40.0000 mg | ORAL_TABLET | Freq: Every day | ORAL | Status: DC

## 2014-11-16 NOTE — Assessment & Plan Note (Signed)
Followed by nephrology. 

## 2014-11-16 NOTE — Assessment & Plan Note (Signed)
Continue beta blocker, hydralazine and nitrates. Not on an ACE inhibitor because of renal insufficiency.

## 2014-11-16 NOTE — Assessment & Plan Note (Signed)
Continue aspirin and statin. 

## 2014-11-16 NOTE — Assessment & Plan Note (Signed)
Continue statin. 

## 2014-11-16 NOTE — Patient Instructions (Signed)
Your physician wants you to follow-up in: ONE YEAR WITH DR CRENSHAW You will receive a reminder letter in the mail two months in advance. If you don't receive a letter, please call our office to schedule the follow-up appointment.  

## 2014-11-16 NOTE — Assessment & Plan Note (Signed)
Blood pressure controlled. Continue present medications. 

## 2014-11-23 ENCOUNTER — Encounter (HOSPITAL_COMMUNITY): Payer: Self-pay | Admitting: Emergency Medicine

## 2014-11-23 ENCOUNTER — Emergency Department (HOSPITAL_COMMUNITY): Payer: Medicare Other

## 2014-11-23 ENCOUNTER — Telehealth: Payer: Self-pay | Admitting: Cardiology

## 2014-11-23 ENCOUNTER — Inpatient Hospital Stay (HOSPITAL_COMMUNITY)
Admission: EM | Admit: 2014-11-23 | Discharge: 2014-11-25 | DRG: 313 | Disposition: A | Discharge status: Home / Self Care | Payer: Medicare Other | Attending: Internal Medicine | Admitting: Internal Medicine

## 2014-11-23 DIAGNOSIS — N183 Chronic kidney disease, stage 3 unspecified: Secondary | ICD-10-CM

## 2014-11-23 DIAGNOSIS — Z9861 Coronary angioplasty status: Secondary | ICD-10-CM | POA: Diagnosis not present

## 2014-11-23 DIAGNOSIS — Z87891 Personal history of nicotine dependence: Secondary | ICD-10-CM | POA: Diagnosis not present

## 2014-11-23 DIAGNOSIS — N184 Chronic kidney disease, stage 4 (severe): Secondary | ICD-10-CM

## 2014-11-23 DIAGNOSIS — Z7982 Long term (current) use of aspirin: Secondary | ICD-10-CM | POA: Diagnosis not present

## 2014-11-23 DIAGNOSIS — R079 Chest pain, unspecified: Secondary | ICD-10-CM | POA: Diagnosis present

## 2014-11-23 DIAGNOSIS — E890 Postprocedural hypothyroidism: Secondary | ICD-10-CM | POA: Diagnosis present

## 2014-11-23 DIAGNOSIS — Z9102 Food additives allergy status: Secondary | ICD-10-CM

## 2014-11-23 DIAGNOSIS — I509 Heart failure, unspecified: Secondary | ICD-10-CM

## 2014-11-23 DIAGNOSIS — Z992 Dependence on renal dialysis: Secondary | ICD-10-CM

## 2014-11-23 DIAGNOSIS — I255 Ischemic cardiomyopathy: Secondary | ICD-10-CM | POA: Diagnosis present

## 2014-11-23 DIAGNOSIS — E039 Hypothyroidism, unspecified: Secondary | ICD-10-CM | POA: Diagnosis present

## 2014-11-23 DIAGNOSIS — E785 Hyperlipidemia, unspecified: Secondary | ICD-10-CM | POA: Diagnosis present

## 2014-11-23 DIAGNOSIS — I5022 Chronic systolic (congestive) heart failure: Secondary | ICD-10-CM | POA: Diagnosis present

## 2014-11-23 DIAGNOSIS — Z8673 Personal history of transient ischemic attack (TIA), and cerebral infarction without residual deficits: Secondary | ICD-10-CM | POA: Diagnosis not present

## 2014-11-23 DIAGNOSIS — I129 Hypertensive chronic kidney disease with stage 1 through stage 4 chronic kidney disease, or unspecified chronic kidney disease: Secondary | ICD-10-CM | POA: Diagnosis present

## 2014-11-23 DIAGNOSIS — R0600 Dyspnea, unspecified: Secondary | ICD-10-CM

## 2014-11-23 DIAGNOSIS — N186 End stage renal disease: Secondary | ICD-10-CM | POA: Diagnosis present

## 2014-11-23 DIAGNOSIS — M109 Gout, unspecified: Secondary | ICD-10-CM | POA: Diagnosis present

## 2014-11-23 DIAGNOSIS — I2510 Atherosclerotic heart disease of native coronary artery without angina pectoris: Secondary | ICD-10-CM | POA: Diagnosis present

## 2014-11-23 DIAGNOSIS — E1129 Type 2 diabetes mellitus with other diabetic kidney complication: Secondary | ICD-10-CM | POA: Diagnosis present

## 2014-11-23 DIAGNOSIS — I10 Essential (primary) hypertension: Secondary | ICD-10-CM | POA: Diagnosis present

## 2014-11-23 LAB — BASIC METABOLIC PANEL
Anion gap: 7 (ref 5–15)
BUN: 45 mg/dL — ABNORMAL HIGH (ref 6–23)
CALCIUM: 9.3 mg/dL (ref 8.4–10.5)
CO2: 22 mmol/L (ref 19–32)
CREATININE: 3.26 mg/dL — AB (ref 0.50–1.35)
Chloride: 114 mmol/L — ABNORMAL HIGH (ref 96–112)
GFR calc Af Amer: 20 mL/min — ABNORMAL LOW (ref >90)
GFR, EST NON AFRICAN AMERICAN: 17 mL/min — AB (ref >90)
Glucose, Bld: 169 mg/dL — ABNORMAL HIGH (ref 70–99)
POTASSIUM: 4.8 mmol/L (ref 3.5–5.1)
Sodium: 143 mmol/L (ref 135–145)

## 2014-11-23 LAB — CBC
HEMATOCRIT: 34.1 % — AB (ref 39.0–52.0)
Hemoglobin: 10.9 g/dL — ABNORMAL LOW (ref 13.0–17.0)
MCH: 30.2 pg (ref 26.0–34.0)
MCHC: 32 g/dL (ref 30.0–36.0)
MCV: 94.5 fL (ref 78.0–100.0)
PLATELETS: 172 10*3/uL (ref 150–400)
RBC: 3.61 MIL/uL — ABNORMAL LOW (ref 4.22–5.81)
RDW: 13.7 % (ref 11.5–15.5)
WBC: 5.2 10*3/uL (ref 4.0–10.5)

## 2014-11-23 LAB — I-STAT TROPONIN, ED: TROPONIN I, POC: 0.01 ng/mL (ref 0.00–0.08)

## 2014-11-23 LAB — TROPONIN I

## 2014-11-23 LAB — BRAIN NATRIURETIC PEPTIDE: B Natriuretic Peptide: 592.3 pg/mL — ABNORMAL HIGH (ref 0.0–100.0)

## 2014-11-23 MED ORDER — ISOSORBIDE MONONITRATE ER 60 MG PO TB24
60.0000 mg | ORAL_TABLET | Freq: Every day | ORAL | Status: DC
  Administered 2014-11-24 – 2014-11-25 (×2): 60 mg via ORAL
  Filled 2014-11-23 (×2): qty 1

## 2014-11-23 MED ORDER — LEVOTHYROXINE SODIUM 200 MCG PO TABS
200.0000 ug | ORAL_TABLET | Freq: Every day | ORAL | Status: DC
  Administered 2014-11-24 – 2014-11-25 (×2): 200 ug via ORAL
  Filled 2014-11-23 (×3): qty 1

## 2014-11-23 MED ORDER — INSULIN ASPART 100 UNIT/ML ~~LOC~~ SOLN
0.0000 [IU] | Freq: Three times a day (TID) | SUBCUTANEOUS | Status: DC
  Administered 2014-11-24: 3 [IU] via SUBCUTANEOUS
  Administered 2014-11-25: 2 [IU] via SUBCUTANEOUS

## 2014-11-23 MED ORDER — FUROSEMIDE 10 MG/ML IJ SOLN
80.0000 mg | Freq: Once | INTRAMUSCULAR | Status: AC
  Administered 2014-11-23: 80 mg via INTRAVENOUS
  Filled 2014-11-23: qty 8

## 2014-11-23 MED ORDER — AMLODIPINE BESYLATE 5 MG PO TABS
5.0000 mg | ORAL_TABLET | Freq: Two times a day (BID) | ORAL | Status: DC
  Administered 2014-11-24 – 2014-11-25 (×2): 5 mg via ORAL
  Filled 2014-11-23 (×4): qty 1

## 2014-11-23 MED ORDER — CARVEDILOL 12.5 MG PO TABS
12.5000 mg | ORAL_TABLET | Freq: Two times a day (BID) | ORAL | Status: DC
  Administered 2014-11-24 – 2014-11-25 (×2): 12.5 mg via ORAL
  Filled 2014-11-23 (×5): qty 1

## 2014-11-23 MED ORDER — ROSUVASTATIN CALCIUM 20 MG PO TABS
20.0000 mg | ORAL_TABLET | Freq: Every day | ORAL | Status: DC
  Administered 2014-11-24: 20 mg via ORAL
  Filled 2014-11-23 (×2): qty 1

## 2014-11-23 MED ORDER — SODIUM CHLORIDE 0.9 % IJ SOLN
3.0000 mL | Freq: Two times a day (BID) | INTRAMUSCULAR | Status: DC
  Administered 2014-11-23 – 2014-11-25 (×3): 3 mL via INTRAVENOUS

## 2014-11-23 MED ORDER — HEPARIN SODIUM (PORCINE) 5000 UNIT/ML IJ SOLN
5000.0000 [IU] | Freq: Three times a day (TID) | INTRAMUSCULAR | Status: DC
  Administered 2014-11-23 – 2014-11-25 (×6): 5000 [IU] via SUBCUTANEOUS
  Filled 2014-11-23 (×6): qty 1

## 2014-11-23 MED ORDER — NITROGLYCERIN 0.4 MG SL SUBL: 0.4000 mg | SUBLINGUAL_TABLET | SUBLINGUAL | Status: DC | PRN

## 2014-11-23 MED ORDER — ASPIRIN 325 MG PO TABS
325.0000 mg | ORAL_TABLET | Freq: Every day | ORAL | Status: DC
  Filled 2014-11-23: qty 1

## 2014-11-23 MED ORDER — OLOPATADINE HCL 0.1 % OP SOLN
1.0000 [drp] | Freq: Two times a day (BID) | OPHTHALMIC | Status: DC
  Administered 2014-11-23 – 2014-11-25 (×4): 1 [drp] via OPHTHALMIC
  Filled 2014-11-23: qty 5

## 2014-11-23 MED ORDER — HYDROCODONE-ACETAMINOPHEN 5-325 MG PO TABS: 1.0000 | ORAL_TABLET | Freq: Four times a day (QID) | ORAL | Status: DC | PRN

## 2014-11-23 NOTE — H&P (Signed)
CARDIOLOGY CONSULT NOTE   Patient ID: [name] [name] [name] [name] MRN: [id number], DOB/AGE: 77/04/1936   Admit date: 11/23/2014 Date of Consult: 11/23/2014  Primary Physician: Tana ConchHUNTER, STEPHEN, MD Primary Cardiologist: Jens Somrenshaw  Reason for consult: Chest pain  Problem List  Past Medical History  Diagnosis Date  . CAD (coronary artery disease)     s/p PCI to mid and mid-distal RCA, 40% sten [name] main, no sig dz LCx   . HTN (hypertension)   . Hyperlipidemia   . Cerebrovascular accident   . CHF (congestive heart failure)     EF 35% echo 2012 ; EF 44% myoview 2012; [facility]  . Diabetes mellitus   . 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 Unspecified hypothyroidism   . Gout     Past Surgical History  Procedure Laterality Date  . Thyroidectomy       Allergies  Allergies  Allergen Reactions  . Tomato     Too much acid    HPI: 79 y/o M w/ PMHx of HTN, HLD, chronic systolic CHF, DM type II, CKD stage 4, Gout, and CAD (s/p PCI to mid and mid-distal RCA, 40% stenosis [name] main, no sig dz LCx), presented to the ED w/ chest pain.  He was just seen in clinic by Dr Jens Somrenshaw hand had been feeling good  Note that he had stopped lasix and it was restarted last week  Patient states the pain started yesterday (11/22/14), located on the left side of his chest, radiating into his left shoulder and back, associated w/ SOB and chills. He states the pain was 10/10 in severity, worse yesterday than today. This AM when he woke up, the pain was still present, he took 1 NTG and the pain improved. He says the last time he felt a pain like this was back in 2005 when he had catheterization and PCI. He denies any associated nausea, vomiting, dizziness, lightheadedness, or palpitations. The patient also endorses DOE for the past couple days, states he doesn't usually have this problem. No PND, orthopnea, LE swelling, or weight change.  Family History Family History  Problem Relation Age of Onset   . Kidney failure Mother   . Diabetes Mother   . Cancer Father     lung  . Heart attack Brother     massive heart attack in 8650s  . Hypertension Mother      Social History History   Social History  . Marital Status: Married    Spouse Name: N/A    Number of Children: N/A  . Years of Education: N/A   Occupational History  . Not on file.   Social History Main Topics  . Smoking status: Former Smoker -- 0.50 packs/day    Types: Cigarettes    Quit date: 10/16/1953  . Smokeless tobacco: Not on file     Comment: quit in the 70's  . Alcohol Use: No  . Drug Use: No  . Sexual Activity: Not Currently   Other Topics Concern  . Not on file   Social History Narrative   [place]ives in [place] with his wife and has 6 kids.      Review of Systems  General:  No chills, fever, night sweats or weight changes.  Cardiovascular:  Positive for chest pain, SOB, DOE. No edema, orthopnea, palpitations, paroxysmal nocturnal dyspnea. Dermatological: No rash, lesions/masses. Respiratory: Positive for dyspnea. No cough.  Patient heard wheezing   Urologic: No hematuria, dysuria.  Abdominal:   No nausea, vomiting, diarrhea, bright red blood per rectum, melena, or hematemesis. Neurologic:  No visual changes, wkns, changes in mental status. All other systems reviewed and are otherwise negative except as noted above.  Physical Exam  Blood pressure 142/63, pulse 65, temperature 97.7 F (36.5 C), temperature source Oral, resp. rate 18, height  (1.753 m), weight 186 lb 5 oz (84.511 kg), SpO2 95 %.   General: Elderly AA male, alert, cooperative, NAD. HEENT: PERRL, EOMI. Moist mucus membranes. Wearing [place].  Neck: Full range of motion without pain, supple, no lymphadenopathy or carotid bruits. JVP is increased    Lungs: Air entry equal bilaterally, bibasilar crackles . No wheezes.  Heart: RRR, no gallops, or rubs. 2/6 systolic murmur.  Abdomen: Soft, non-tender, non-distended, BS + Extremities: No  cyanosis, clubbing;  Triv edema. Neurologic: Alert & oriented x3, cranial nerves II-XII intact, strength grossly intact, sensation intact to light touch.   Labs  No results for input(s): CKTOTAL, CKMB, TROPONINI in the last 72 hours. Lab Results  Component Value Date   WBC 5.2 11/23/2014   HGB 10.9* 11/23/2014   HCT 34.1* 11/23/2014   MCV 94.5 11/23/2014   PLT 172 11/23/2014    Recent Labs Lab 11/23/14 1118  NA 143  K 4.8  CL 114*  CO2 22  BUN 45*  CREATININE 3.26*  CALCIUM 9.3  GLUCOSE 169*   Lab Results  Component Value Date   CHOL 114 09/06/2013   HDL 40 09/06/2013   LDLCALC 51 09/06/2013   TRIG 117 09/06/2013    Radiology/Studies  Dg Chest 2 View (if Patient Has Fever And/or Copd)  11/23/2014   CLINICAL DATA:  Mid to left-sided chest pain for 1 day.  EXAM: CHEST  2 VIEW  COMPARISON:  09/03/2014  FINDINGS: Chronic cardiomegaly. There are new small pleural effusions and interstitial/fissural thickening. Asymmetric density at the right base is likely fissural fluid rather than pneumonia. Stable aortic and hilar contours.  IMPRESSION: CHF.   Electronically Signed   By: Tiburcio Pea M.D.   On: 11/23/2014 12:43    ECHO: 2008  SUMMARY - Left ventricular size was at the upper limits of normal. Overall    left ventricular systolic function was at the lower limits of    normal. Left ventricular ejection fraction was estimated ,    range being 50 % to 55 %. There was no diagnostic evidence of    left ventricular regional wall motion abnormalities. Left    ventricular wall thickness was mildly increased. Features    were consistent with mild diastolic dysfunction. - There was mild aortic root dilatation. - Left atrial size was at the upper limits of normal. - The estimated peak pulmonary artery systolic pressure was mildly    increased. - There was a minimal pericardial effusion, without hemodynamic    compromise.  ECG: NSR  w/ 1st degree AV block. Pseudonormalization of lateral T-waves (previously inverted).    ASSESSMENT AND PLAN  79 y/o M w/ PMHx of HTN, HLD, chronic systolic CHF, DM type II, CKD stage 4, Gout, and CAD (s/p PCI in 2005 to mid and mid-distal RCA; also w/ 40% stenosis [name] main, no significant disease to LCx), admitted for chest pain.   Chest Pain: Chest pain seems mix of typical/atypical chest pain. Patient unable to describe quality of pain but does have associated SOB and radiation. No change w/ inspiration or movement, not reproducible. Improved w/ NTG at home. Significant cardiac history, previous  PCI to RCA in 2005. Per chart review, Myoview in 08/2013 showed no ischemia or infarction and EF of 76%. poc troponin negative, EKG w/ pseudonormalization of lateral T-waves (previously inverted on EKG from 2014). CXR read as CHF pattern; fluid in fissure in right lung, mild pulmonary vascular congestion. Requiring O2 via [place], decrease SpO2 to 89% on RA initially. Still possibility of PE, however, revised Geneva score low probability. Not tachycardic.  Patinet denies change in food/fluid intake recently   -Cycle troponins -Repeat EKG in AM -Continue ASA 325 + Crestor -Continue Coreg 12.5 mg bid -Continue Imdur 60 mg daily -Lasix 80 mg IV once; reassess in AM and dose as needed   -NTG prn -ECHO  Chronic Systolic CHF: Most recent ECHO in Epic from 2008 which showed EF of 50-55% w/ mildly increased ventricular wall thickness and mild diastolic dysfunction. Per Dr. Ludwig Clarks most recent note, ECHO from 12/2010 showed EF of 35% w/ mild mitral regurgitation, mild dilation of aortic root, and right atrial/ventricular enlargement. CXR results suggest pulmonary vascular congestion, fluid in fissure. Patient takes Lasix at home, however, he went all of January without taking Lasix b/c he took 2x the dose for the month prior (confusion about dose) and insurance would not cover refill. Has been taking 40 daily since  most recent visit w/ Dr. [name] on 11/16/14. On exam, patient w/ bibasilar crackles and elevated JVP. No LE edema.  Patinet denies dietary changes   -Repeat ECHO as above -Continue Coreg, Imdur -Lasix 80 mg IV then reassess as above -Strict I/O, daily weights -TSH pending  HTN: Mildly hypertensive on exam. On Norvasc 5 mg daily, Coreg as above, Lasix 40 mg qd, Hydralazine 50 mg tid, and Imdur as above -Continue home meds  DM type II: Most recent HbA1c 7.1 in 09/2014. Takes sulfonurea at home.  -ISS-S for now  CKD stage 4: Baseline Cr unclear, somewhere b/w 3-4. 3.26 today.  -Lasix as above -Repeat BMP in AM   Signed, Lars Masson, MD 11/23/2014, 2:10 PM  Pager: [phone]  Patinet seen and examined  I have amended note above to reflect my findingss   Diurese Get echo  Dietrich Pates

## 2014-11-23 NOTE — Telephone Encounter (Signed)
Pt called in complaining of CP and SOB. Pt stated that he took a NTG on yesterday and went to sleep. When he woke up this morning he was having the same symptoms and took another NTG. He said he did get some relief but that he was still short of breath. Advised patient to go to ED and be seen.

## 2014-11-23 NOTE — ED Notes (Signed)
Pt given meal tray.

## 2014-11-23 NOTE — ED Notes (Signed)
Pt c/o left sided chest pain that radiates to back and down left arm onset yesterday. Pt reports shortness of breath and feeling cold also.

## 2014-11-23 NOTE — ED Notes (Signed)
Pt reports he has urinated twice since receiving lasix earlier today. Info passed to floor RN

## 2014-11-23 NOTE — ED Provider Notes (Addendum)
CSN: [id number]     Arrival date & time 11/23/14  1056 History   First MD Initiated Contact with Patient 11/23/14 1139     Chief Complaint  Patient presents with  . Chest Pain     (Consider location/radiation/quality/duration/timing/severity/associated sxs/prior Treatment) HPI Comments: Patient here complaining of chest pain which radiates to his back and down his left arm. Symptoms started yesterday and have been waxing and waning and associated with dyspnea. He took nitroglycerin with some relief of symptoms. Denies any associated diaphoresis. No cough or congestion. No leg pain or swelling. Does have a history of angina and this is similar. No syncope or near-syncope. No fever or chills. Symptoms persistent and nothing makes them better  Patient is a 79 y.o. male presenting with chest pain. The history is provided by the patient.  Chest Pain   Past Medical History  Diagnosis Date  . CAD (coronary artery disease)     s/p PCI to mid and mid-distal RCA, 40% sten L main, no sig dz LCx   . HTN (hypertension)   . Hyperlipidemia   . Cerebrovascular accident   . CHF (congestive heart failure)     EF 35% echo 2012 ; EF 44% myoview 2012; [facility]  . Diabetes mellitus   . 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 Unspecified hypothyroidism   . Gout    Past Surgical History  Procedure Laterality Date  . Thyroidectomy     Family History  Problem Relation Age of Onset  . Kidney failure Mother   . Diabetes Mother   . Cancer Father     lung  . Heart attack Brother     massive heart attack in 6050s  . Hypertension Mother    History  Substance Use Topics  . Smoking status: Former Smoker -- 0.50 packs/day    Types: Cigarettes    Quit date: 10/16/1953  . Smokeless tobacco: Not on file     Comment: quit in the 70's  . Alcohol Use: No    Review of Systems  Cardiovascular: Positive for chest pain.  All other systems reviewed and are  negative.     Allergies  Tomato  Home Medications   Prior to Admission medications   Medication Sig Start Date End Date Taking? Authorizing Provider  acetaminophen (TYLENOL) 500 MG tablet Take 500 mg by mouth every 6 (six) hours as needed.    Historical Provider, MD  amLODipine (NORVASC) 5 MG tablet Take 1 tablet (5 mg total) by mouth 2 (two) times daily. 11/16/14   Lewayne BuntingBrian S Crenshaw, MD  aspirin 325 MG tablet Take 325 mg by mouth daily.      Historical Provider, MD  calcitRIOL (ROCALTROL) 0.25 MCG capsule 0.25 mcg. Take one cap by mouth on odd days and 2 caps on even days.     Historical Provider, MD  carvedilol (COREG) 12.5 MG tablet Take 1 tablet (12.5 mg total) by mouth 2 (two) times daily with a meal. 02/16/14   Stacie GlazeJohn E Jenkins, MD  chlorzoxazone (PARAFON) 500 MG tablet Take 1 tablet (500 mg total) by mouth 3 (three) times daily as needed for muscle spasms. 09/18/14   Shelva MajesticStephen O Hunter, MD  colchicine 0.6 MG tablet Take 0.5 tablets (0.3 mg total) by mouth daily. 09/06/13   Ripudeep Jenna LuoK Rai, MD  furosemide (LASIX) 80 MG tablet Take 0.5 tablets (40 mg total) by mouth daily. 11/16/14   Lewayne BuntingBrian S Crenshaw, MD  glipiZIDE (GLUCOTROL) 5 MG tablet Take 1/2 tab before breakfast and full tab before dinner 09/18/14   [name], MD  hydrALAZINE (APRESOLINE) 50 MG tablet Take 1 tablet (50 mg total) by mouth 3 (three) times daily. 09/18/14   [name], MD  HYDROcodone-acetaminophen (NORCO/VICODIN) 5-325 MG per tablet Take 1 tablet by mouth every 6 (six) hours as needed for moderate pain. 09/18/14   [name], MD  isosorbide mononitrate (IMDUR) 60 MG 24 hr tablet TAKE 1 TABLET BY MOUTH DAILY. 03/03/14   Stacie Glaze, MD  levothyroxine (SYNTHROID) 200 MCG tablet Take 1 tablet (200 mcg total) by mouth daily. 09/16/14   [name], MD  magnesium 30 MG tablet Take 30 mg by mouth 2 (two) times daily as needed (cramping in legs).     Historical Provider, MD  nitroGLYCERIN (NITROSTAT) 0.4 MG SL  tablet Place 1 tablet (0.4 mg total) under the tongue every 5 (five) minutes as needed for chest pain. 06/24/12 09/05/13  Stacie Glaze, MD  olopatadine (PATANOL) 0.1 % ophthalmic solution Place 1 drop into both eyes 2 (two) times daily. 03/31/14   Stacie Glaze, MD  rosuvastatin (CRESTOR) 20 MG tablet Take 1 tablet (20 mg total) by mouth daily. 04/13/14   Toniann Ket, PA-C   BP 159/86 mmHg  Pulse 71  Temp(Src) 97.7 F (36.5 C) (Oral)  Resp 18  Ht  (1.753 m)  Wt 186 lb 5 oz (84.511 kg)  BMI 27.50 kg/m2  SpO2 95% Physical Exam  Constitutional: He is oriented to person, place, and time. He appears well-developed and well-nourished.  Non-toxic appearance. No distress.  HENT:  Head: Normocephalic and atraumatic.  Eyes: Conjunctivae, EOM and lids are normal. Pupils are equal, round, and reactive to light.  Neck: Normal range of motion. Neck supple. No tracheal deviation present. No thyroid mass present.  Cardiovascular: Normal rate, regular rhythm and normal heart sounds.  Exam reveals no gallop.   No murmur heard. Pulmonary/Chest: Effort normal and breath sounds normal. No stridor. No respiratory distress. He has no decreased breath sounds. He has no wheezes. He has no rhonchi. He has no rales.  Abdominal: Soft. Normal appearance and bowel sounds are normal. He exhibits no distension. There is no tenderness. There is no rebound and no CVA tenderness.  Musculoskeletal: Normal range of motion. He exhibits no edema or tenderness.  Neurological: He is alert and oriented to person, place, and time. He has normal strength. No cranial nerve deficit or sensory deficit. GCS eye subscore is 4. GCS verbal subscore is 5. GCS motor subscore is 6.  Skin: Skin is warm and dry. No abrasion and no rash noted.  Psychiatric: He has a normal mood and affect. His speech is normal and behavior is normal.  Nursing note and vitals reviewed.   ED Course  Procedures (including critical care time) Labs  Review Labs Reviewed  CBC - Abnormal; Notable for the following:    RBC 3.61 (*)    Hemoglobin 10.9 (*)    HCT 34.1 (*)    All other components within normal limits  BASIC METABOLIC PANEL - Abnormal; Notable for the following:    Chloride 114 (*)    Glucose, Bld 169 (*)    BUN 45 (*)    Creatinine, Ser 3.26 (*)    GFR calc non Af Amer 17 (*)    GFR calc Af Amer 20 (*)    All other components within normal limits  Rosezena Sensor, ED    Imaging Review No results found.   EKG Interpretation   Date/Time:  Monday November 23 2014 11:11:14 EST Ventricular Rate:  75 PR Interval:  238 QRS Duration: 106 QT Interval:  440 QTC Calculation: 491 R Axis:   81 Text Interpretation:  Sinus rhythm with 1st degree A-V block Incomplete  right bundle branch block Nonspecific T wave abnormality Prolonged QT  Abnormal ECG No significant change since last tracing Confirmed by Kerry-Anne Mezo   MD, [place] ([id number]) on 11/23/2014 11:40:17 AM      MDM   Final diagnoses:  None   CRITICAL CARE Performed by: Toy Baker Total critical care time: 60 Critical care time was exclusive of separately billable procedures and treating other patients. Critical care was necessary to treat or prevent imminent or life-threatening deterioration. Critical care was time spent personally by me on the following activities: development of treatment plan with patient and/or surrogate as well as nursing, discussions with consultants, evaluation of patient's response to treatment, examination of patient, obtaining history from patient or surrogate, ordering and performing treatments and interventions, ordering and review of laboratory studies, ordering and review of radiographic studies, pulse oximetry and re-evaluation of patient's condition.   Patient's chest x-ray consistent with CHF will give IV Lasix here.    Will consult cardiology for admission     Toy Baker, MD 11/23/14 1228  Toy Baker,  MD 11/23/14 1524

## 2014-11-24 ENCOUNTER — Encounter (HOSPITAL_COMMUNITY): Payer: Self-pay | Admitting: General Practice

## 2014-11-24 ENCOUNTER — Inpatient Hospital Stay (HOSPITAL_COMMUNITY): Payer: Medicare Other

## 2014-11-24 DIAGNOSIS — I25110 Atherosclerotic heart disease of native coronary artery with unstable angina pectoris: Secondary | ICD-10-CM

## 2014-11-24 DIAGNOSIS — R079 Chest pain, unspecified: Secondary | ICD-10-CM

## 2014-11-24 LAB — TROPONIN I
Troponin I: <0.03 ng/mL (ref <0.031)
Troponin I: <0.03 ng/mL (ref <0.031)

## 2014-11-24 LAB — GLUCOSE, CAPILLARY
GLUCOSE-CAPILLARY: 191 mg/dL — AB (ref 70–99)
GLUCOSE-CAPILLARY: 134 mg/dL — AB (ref 70–99)
Glucose-Capillary: 246 mg/dL — ABNORMAL HIGH (ref 70–99)
Glucose-Capillary: 156 mg/dL — ABNORMAL HIGH (ref 70–99)

## 2014-11-24 LAB — MRSA PCR SCREENING: MRSA by PCR: NEGATIVE

## 2014-11-24 LAB — CBC
HEMATOCRIT: 31.9 % — AB (ref 39.0–52.0)
Hemoglobin: 10.2 g/dL — ABNORMAL LOW (ref 13.0–17.0)
MCH: 29.9 pg (ref 26.0–34.0)
MCHC: 32 g/dL (ref 30.0–36.0)
MCV: 93.5 fL (ref 78.0–100.0)
Platelets: 163 10*3/uL (ref 150–400)
RBC: 3.41 MIL/uL — AB (ref 4.22–5.81)
RDW: 13.8 % (ref 11.5–15.5)
WBC: 5.9 10*3/uL (ref 4.0–10.5)

## 2014-11-24 LAB — BASIC METABOLIC PANEL
Anion gap: 9 (ref 5–15)
BUN: 48 mg/dL — ABNORMAL HIGH (ref 6–23)
CHLORIDE: 113 mmol/L — AB (ref 96–112)
CO2: 20 mmol/L (ref 19–32)
CREATININE: 3.36 mg/dL — AB (ref 0.50–1.35)
Calcium: 9.1 mg/dL (ref 8.4–10.5)
GFR calc non Af Amer: 16 mL/min — ABNORMAL LOW (ref >90)
GFR, EST AFRICAN AMERICAN: 19 mL/min — AB (ref >90)
Glucose, Bld: 140 mg/dL — ABNORMAL HIGH (ref 70–99)
POTASSIUM: 4.6 mmol/L (ref 3.5–5.1)
Sodium: 142 mmol/L (ref 135–145)

## 2014-11-24 LAB — TSH: TSH: 0.754 u[IU]/mL (ref 0.350–4.500)

## 2014-11-24 MED ORDER — REGADENOSON 0.4 MG/5ML IV SOLN
INTRAVENOUS | Status: AC
  Filled 2014-11-24: qty 5

## 2014-11-24 MED ORDER — REGADENOSON 0.4 MG/5ML IV SOLN
0.4000 mg | Freq: Once | INTRAVENOUS | Status: DC
  Filled 2014-11-24: qty 5

## 2014-11-24 MED ORDER — ASPIRIN EC 325 MG PO TBEC
325.0000 mg | DELAYED_RELEASE_TABLET | Freq: Every day | ORAL | Status: DC
  Administered 2014-11-25: 325 mg via ORAL
  Filled 2014-11-24 (×2): qty 1

## 2014-11-24 NOTE — Progress Notes (Signed)
Admitted from ED via stretcher, pt alert and oriented, denied chest pain at this time, oriented to room, call bell placed within reach, admission assessment done, orders carried out.    11/23/14 2121  Vitals  Temp 97.4 F (36.3 C)  Temp Source Oral  BP (!) 151/61 mmHg  BP Location Left Arm  BP Method Automatic  Patient Position (if appropriate) Lying  Pulse Rate 76  Pulse Rate Source Dinamap  Resp 20  Oxygen Therapy  SpO2 93 %  O2 Device Nasal Cannula  O2 Flow Rate (L/min) 2 L/min  Height and Weight  Height 5\' 9"  (1.753 m)  Weight 78.744 kg (173 lb 9.6 oz) (a scale)  Type of Scale Used Standing  Type of Weight Actual  BSA (Calculated - sq m) 1.96 sq meters  BMI (Calculated) 25.7  Weight in (lb) to have BMI = 25 168.9

## 2014-11-24 NOTE — Progress Notes (Signed)
Patient Profile: 79 y/o M w/ PMHx of HTN, HLD, chronic systolic CHF, DM type II, CKD stage 4, Gout, and CAD (s/p PCI in 2005 to mid and mid-distal RCA; also w/ 40% stenosis L main, no significant disease to LCx), admitted for chest pain.   Subjective: Resting comfortably. States that he is feeling better. Breathing improved. No chest pain.   Objective: Vital signs in last 24 hours: Temp:  [97.4 F (36.3 C)-99.6 F (37.6 C)] 98.4 F (36.9 C) (02/09 8119) Pulse Rate:  [55-87] 72 (02/09 0613) Resp:  [13-25] 20 (02/09 0613) BP: (119-151)/(47-91) 150/75 mmHg (02/09 0613) SpO2:  [89 %-98 %] 94 % (02/09 0613) Weight:  [173 lb 9.6 oz (78.744 kg)-176 lb 5.9 oz (80 kg)] 176 lb 5.9 oz (80 kg) (02/09 1478) Last BM Date: 11/23/14  Intake/Output from previous day: 02/08 0701 - 02/09 0700 In: 240 [P.O.:240] Out: 1000 [Urine:1000] Intake/Output this shift: Total I/O In: 0  Out: 225 [Urine:225]  Medications Current Facility-Administered Medications  Medication Dose Route Frequency Provider Last Rate Last Dose  . amLODipine (NORVASC) tablet 5 mg  5 mg Oral BID Courtney Paris, MD      . aspirin tablet 325 mg  325 mg Oral Daily Courtney Paris, MD      . carvedilol (COREG) tablet 12.5 mg  12.5 mg Oral BID WC Courtney Paris, MD      . heparin injection 5,000 Units  5,000 Units Subcutaneous 3 times per day Courtney Paris, MD   5,000 Units at 11/24/14 [phone]  . HYDROcodone-acetaminophen (NORCO/VICODIN) 5-325 MG per tablet 1 tablet  1 tablet Oral Q6H PRN Courtney Paris, MD      . insulin aspart (novoLOG) injection 0-9 Units  0-9 Units Subcutaneous TID WC Courtney Paris, MD   0 Units at 11/24/14 [phone]  . isosorbide mononitrate (IMDUR) 24 hr tablet 60 mg  60 mg Oral Daily Courtney Paris, MD      . levothyroxine (SYNTHROID, LEVOTHROID) tablet 200 mcg  200 mcg Oral QAC breakfast Courtney Paris, MD   200 mcg at 11/24/14 0605  . nitroGLYCERIN (NITROSTAT) SL tablet 0.4 mg  0.4 mg Sublingual Q5 min PRN Courtney Paris, MD      .  olopatadine (PATANOL) 0.1 % ophthalmic solution 1 drop  1 drop Both Eyes BID Courtney Paris, MD   1 drop at 11/23/14 2304  . rosuvastatin (CRESTOR) tablet 20 mg  20 mg Oral q1800 Courtney Paris, MD      . sodium chloride 0.9 % injection 3 mL  3 mL Intravenous Q12H Courtney Paris, MD   3 mL at 11/23/14 2305    PE: General appearance: alert, cooperative and no distress Neck: no carotid bruit and elevated JVD Lungs: decreased BS at the bases with faint basilar rales L>R Heart: regular rate and rhythm, S1, S2 normal, no murmur, click, rub or gallop Extremities: no LEE Pulses: 2+ and symmetric Skin: warm and dry Neurologic: Grossly normal  Lab Results:   Recent Labs  11/23/14 1118 11/24/14 0356  WBC 5.2 5.9  HGB 10.9* 10.2*  HCT 34.1* 31.9*  PLT 172 163   BMET  Recent Labs  11/23/14 1118 11/24/14 0356  NA 143 142  K 4.8 4.6  CL 114* 113*  CO2 22 20  GLUCOSE 169* 140*  BUN 45* 48*  CREATININE 3.26* 3.36*  CALCIUM 9.3 9.1      Recent Labs  11/23/14 2251 11/24/14 0356 11/24/14  [id number]  TROPONINI <0.03 <0.03 <0.03   Filed Weights   11/23/14 1112 11/23/14 2121 11/24/14 0613  Weight: 186 lb 5 oz (84.511 kg) 173 lb 9.6 oz (78.744 kg) 176 lb 5.9 oz (80 kg)    Studies/Results: NST- pending  2D echo pending   Assessment/Plan  Active Problems:   Chest pain  1. Chest Pain: CP resolved. Cardiac enzymes negative x 3. Significant cardiac history, previous PCI to RCA in 2005. Per chart review, Myoview in 08/2013 showed no ischemia or infarction and EF of 76%. EKG w/ pseudonormalization of lateral T-waves (previously inverted on EKG from 2014). Continue medical therapy for now. ASA, Coreg, Imdur and Crestor. Will scheduled for NST today.   2. Chronic Systolic CHF:  EF 12/2010 was 35% w/ mild mitral regurgitation, mild dilation of aortic root, and right atrial/ventricular enlargement. CXR results suggest pulmonary vascular congestion, fluid in fissure. BNP abnormal at 592. Repeat  echo pending. Received dose of 80 mg IV lasix x 1 yesterday. Total output yesterday was 1L. Breathing improved. SCr bumped from 3.26 to 3.36. MD to assess. Will need to use caution with diuretics to avoid further renal impairment.   3. HTN: Mildly hypertensive this am w/ SBP of 150. On Norvasc 5 mg daily, Coreg 12.5 mg BID, Hydralazine 50 mg tid, and Imdur 60 mg.  4. DM type II: Most recent HbA1c 7.1 in 09/2014. Takes sulfonurea at home.  -ISS-S for now  5. CKD stage 4: Baseline Cr unclear, somewhere b/w 3-4. 3.26 today. SCr increased to 3.35 after dose of IV lasix yesterday. Will use caution with diuretics.       LOS: 1 day    Brittainy M. Sharol HarnessSimmons, PA-C 11/24/2014 11:59 AM  I have seen and examined the patient along with Brittainy M. Sharol HarnessSimmons, PA-C.  I have reviewed the chart, notes and new data.  I agree with PA's note.  Key new complaints: symptom free now Key examination changes: no clinical HF, no arrhythmia Key new findings / data: creatinine > 3, mild ECG T wave changes, normal enzymes  PLAN: Risk of coronary angio is substantial. Plan nuclear perfusion study today and cath only if there is a very large reversible defect.  Thurmon FairMihai Wandalene Abrams, MD, [facility] [phone] 11/24/2014, 12:35 PM

## 2014-11-24 NOTE — Care Management Note (Unsigned)
    Page 1 of 1   11/24/2014     4:46:16 PM CARE MANAGEMENT NOTE 11/24/2014  Patient:  [name] [name],[name] [name]   Account Number:  [id number]  Date Initiated:  11/24/2014  Documentation initiated by:  Rosevelt Luu  Subjective/Objective Assessment:   Pt adm on 11/23/14 with CHF.  PTA, pt resides at home with spouse.     Action/Plan:   Will follow for dc needs as pt progresses.   Anticipated DC Date:  11/27/2014   Anticipated DC Plan:  HOME W HOME HEALTH SERVICES      DC Planning Services  CM consult      Choice offered to / List presented to:             Status of service:  In process, will continue to follow Medicare Important Message given?   (If response is "NO", the following Medicare IM given date fields will be blank) Date Medicare IM given:   Medicare IM given by:   Date Additional Medicare IM given:   Additional Medicare IM given by:    Discharge Disposition:    Per UR Regulation:  Reviewed for med. necessity/level of care/duration of stay  If discussed at Long Length of Stay Meetings, dates discussed:    Comments:

## 2014-11-25 ENCOUNTER — Encounter: Payer: Self-pay | Admitting: Physician Assistant

## 2014-11-25 DIAGNOSIS — I509 Heart failure, unspecified: Secondary | ICD-10-CM

## 2014-11-25 DIAGNOSIS — I5043 Acute on chronic combined systolic (congestive) and diastolic (congestive) heart failure: Secondary | ICD-10-CM

## 2014-11-25 DIAGNOSIS — I255 Ischemic cardiomyopathy: Secondary | ICD-10-CM

## 2014-11-25 LAB — GLUCOSE, CAPILLARY
GLUCOSE-CAPILLARY: 197 mg/dL — AB (ref 70–99)
Glucose-Capillary: 118 mg/dL — ABNORMAL HIGH (ref 70–99)
Glucose-Capillary: 120 mg/dL — ABNORMAL HIGH (ref 70–99)

## 2014-11-25 MED ORDER — FUROSEMIDE 40 MG PO TABS: 60.0000 mg | ORAL_TABLET | Freq: Every day | ORAL | Status: DC

## 2014-11-25 MED ORDER — NITROGLYCERIN 0.4 MG SL SUBL: 0.4000 mg | SUBLINGUAL_TABLET | SUBLINGUAL | Status: AC | PRN

## 2014-11-25 NOTE — Progress Notes (Signed)
  Echocardiogram 2D Echocardiogram has been performed.  Janalyn HarderWest, Dailey Buccheri R 11/25/2014, 11:03 AM

## 2014-11-25 NOTE — Discharge Summary (Signed)
Discharge Summary   [name] [name] ID: [name] [name],  MRN: [id number], DOB/AGE: 77/04/1936 77 y.o.  Admit date: 11/23/2014 Discharge date: 11/25/2014  Primary Care Provider: Tana ConchHUNTER, STEPHEN Primary Cardiologist: Dr. Jens Somrenshaw  Discharge Diagnoses Principal Problem:   Chest pain Active Problems:   Hypothyroidism   Type II diabetes mellitus with renal manifestations   Hyperlipidemia   Essential hypertension   Coronary atherosclerosis   Chronic renal disease, stage IV   Ischemic cardiomyopathy   Allergies Allergies  Allergen Reactions  . Tomato     Too much acid    Procedures  Echocardiogram 11/25/2014 - Left ventricle: The cavity size was normal. Wall thickness was increased in a pattern of mild LVH. Systolic function was mildly to moderately reduced. The estimated ejection fraction was in the range of 40% to 45%. Severe hypokinesis of the inferior and inferoseptal myocardium. Features are consistent with a pseudonormal left ventricular filling pattern, with concomitant abnormal relaxation and increased filling pressure (grade 2 diastolic dysfunction). - Aortic valve: There was trivial regurgitation. - Mitral valve: There was moderate regurgitation directed centrally. - Left atrium: The atrium was moderately dilated. - Tricuspid valve: There was regurgitation directed centrally. There was mild-moderate perivalvular regurgitation. - Pulmonary arteries: Systolic pressure was moderately increased. PA peak pressure: 56 mm Hg (S).   Stress test  11/24/2014 IMPRESSION: 1. There is a moderate sized, moderate intensity defect involving the entire inferior wall consistent with prior infarct with a small area of peri infarct ischemia in the inferoapex. There is a moderate sized, moderate intensity defect in the inferolateral wall consistent with prior infarct with a moderate area of ischemia in the mid and apical inferolateral wall.  2. Moderately to  severely reduced LVF with diffuse hypokinesis.  3. Left ventricular ejection fraction 39%  4. High-risk stress test findings*.     Hospital Course  79 y/o M w/ PMHx of HTN, HLD, chronic systolic CHF, DM type II, CKD stage 4, Gout, and CAD (s/p PCI to mid and mid-distal RCA, 40% stenosis L main, no sig dz LCx), presented to the ED on 11/23/2014 with chest pain. According to the [name] [name], the chest pain started the day prior to admission and located on the left side of his chest radiating to his left shoulder and back. He endorsed some associated shortness of breath and chill. On the day of admission, [name] [name] woke up in the morning with chest pain. He took one sublingual nitroglycerin and the pain improved. The chest discomfort was reminiscent of his previous anginal pain back in 2005, although does have some atypical features as well. His previous Myoview in November 2014 show no ischemia or infarction with EF 76%.  He was admitted overnight. Serial enzyme were negative. EKG showed pseudonormalization of lateral T-wave. His chest discomfort resolved by following morning. Given his significant past medical history of coronary artery disease, he underwent Myoview on 11/24/2014 which showed EF 39%, moderately to severely reduced LVEF with diffuse hypokinesis. Moderate dized, moderate intensity defect involving entire inferior wall consistent with prior infarct with small area of peri infarct ischemia in inferoapex, moderate sized to moderate intensity defect in the inferolateral wall consistent with primary infarct with moderate area of ischemia in the mid and apical inferolateral wall. The Myoview was reviewed in detail on the following day. Given his poor renal function, invasive procedure would carry high risk of contrast nephropathy. It was felt the findings suggest occlusion of the RCA with infarction. Dr. Royann Shiversroitoru had a long discussion with the [name] [name] regarding  risk of cardiac catheterization, at this  time it was felt the risk outweighs the benefit with this procedure. [name] [name] is determined to avoid hemodialysis since he does not want that decrement in quality of life. He has agreed to medical therapy. We have discussed with the [name] [name] that he needs to seek medical attention if has chest pain greater than 30 minutes. Prior to discharge, echocardiogram was obtained to establish baseline which showed EF 40-45%. Dr. Royann Shivers review the echocardiogram at the bedside and felt he is stable for discharge from cardiology perspective. He was on 40 mg daily of Lasix, we will increase it to 60 mg daily of by mouth Lasix on discharge. I have also given his a Rx for sublingual nitro. Outpatient followup has been arranged with Dr. Ludwig Clarks office.   Discharge Vitals Blood pressure 135/69, pulse 72, temperature 98.6 F (37 C), temperature source Oral, resp. rate 16, height  (1.753 m), weight 174 lb (78.926 kg), SpO2 98 %.  Filed Weights   11/23/14 2121 11/24/14 0613 11/25/14 0612  Weight: 173 lb 9.6 oz (78.744 kg) 176 lb 5.9 oz (80 kg) 174 lb (78.926 kg)    Labs  CBC  Recent Labs  11/23/14 1118 11/24/14 0356  WBC 5.2 5.9  HGB 10.9* 10.2*  HCT 34.1* 31.9*  MCV 94.5 93.5  PLT 172 163   Basic Metabolic Panel  Recent Labs  11/23/14 1118 11/24/14 0356  NA 143 142  K 4.8 4.6  CL 114* 113*  CO2 22 20  GLUCOSE 169* 140*  BUN 45* 48*  CREATININE 3.26* 3.36*  CALCIUM 9.3 9.1   Cardiac Enzymes  Recent Labs  11/23/14 2251 11/24/14 0356 11/24/14 0958  TROPONINI <0.03 <0.03 <0.03   Thyroid Function Tests  Recent Labs  11/23/14 2251  TSH 0.754    Disposition  Pt is being discharged home today in good condition.  Follow-up Plans & Appointments      Follow-up Information    Follow up with Olga Millers, MD.   Specialty:  Cardiology   Why:  office will contact you to schedule followup, please give [place] a call if you do not hear from [place] in 2 days   Contact information:    [address] [place] [address] [phone]       Discharge Medications    Medication List    STOP taking these medications        glipiZIDE 5 MG tablet  Commonly known as:  GLUCOTROL      TAKE these medications        acetaminophen 500 MG tablet  Commonly known as:  TYLENOL  Take 500 mg by mouth every 6 (six) hours as needed.     amLODipine 5 MG tablet  Commonly known as:  NORVASC  Take 1 tablet (5 mg total) by mouth 2 (two) times daily.     aspirin 325 MG tablet  Take 325 mg by mouth daily.     calcitRIOL 0.25 MCG capsule  Commonly known as:  ROCALTROL  0.25 mcg. Take one cap by mouth on odd days and 2 caps on even days.     carvedilol 12.5 MG tablet  Commonly known as:  COREG  Take 1 tablet (12.5 mg total) by mouth 2 (two) times daily with a meal.     chlorzoxazone 500 MG tablet  Commonly known as:  PARAFON  Take 1 tablet (500 mg total) by mouth 3 (three) times daily as needed for muscle spasms.  Cod Liver Oil Oil  Take 15 mLs by mouth daily.     colchicine 0.6 MG tablet  Take 0.5 tablets (0.3 mg total) by mouth daily.     furosemide 40 MG tablet  Commonly known as:  LASIX  Take 1.5 tablets (60 mg total) by mouth daily.     glyBURIDE 5 MG tablet  Commonly known as:  DIABETA  Take 5 mg by mouth daily with breakfast. Take a 1/2 pill before breakfast and a whole pill before 2nd meal of the day.     hydrALAZINE 50 MG tablet  Commonly known as:  APRESOLINE  Take 1 tablet (50 mg total) by mouth 3 (three) times daily.     HYDROcodone-acetaminophen 5-325 MG per tablet  Commonly known as:  NORCO/VICODIN  Take 1 tablet by mouth every 6 (six) hours as needed for moderate pain.     isosorbide mononitrate 60 MG 24 hr tablet  Commonly known as:  IMDUR  TAKE 1 TABLET BY MOUTH DAILY.     levothyroxine 200 MCG tablet  Commonly known as:  SYNTHROID  Take 1 tablet (200 mcg total) by mouth daily.     nitroGLYCERIN 0.4 MG SL tablet  Commonly  known as:  NITROSTAT  Place 1 tablet (0.4 mg total) under the tongue every 5 (five) minutes as needed for chest pain.     olopatadine 0.1 % ophthalmic solution  Commonly known as:  PATANOL  Place 1 drop into both eyes 2 (two) times daily.     OVER THE COUNTER MEDICATION  Take 1 Bottle by mouth as needed (constipation).     rosuvastatin 20 MG tablet  Commonly known as:  CRESTOR  Take 1 tablet (20 mg total) by mouth daily.         Duration of Discharge Encounter   Greater than 30 minutes including physician time.  Ramond Dial PA-C Pager: [phone] 11/25/2014, 3:26 PM

## 2014-11-25 NOTE — Discharge Instructions (Signed)

## 2014-11-25 NOTE — Progress Notes (Signed)
Patient Name: [name] [name] Date of Encounter: 11/25/2014  Active Problems:   Chest pain   Length of Stay: 2  SUBJECTIVE  No angina. No dyspnea. Nuclear study showed a large inferior wall scar with minimal reversibility and a substantial drop in LVEF from before (39%). In/out matched; Creat abnormal, but stable. Echo not yet performed  CURRENT MEDS . amLODipine  5 mg Oral BID  . aspirin EC  325 mg Oral Daily  . carvedilol  12.5 mg Oral BID WC  . heparin  5,000 Units Subcutaneous 3 times per day  . insulin aspart  0-9 Units Subcutaneous TID WC  . isosorbide mononitrate  60 mg Oral Daily  . levothyroxine  200 mcg Oral QAC breakfast  . olopatadine  1 drop Both Eyes BID  . regadenoson  0.4 mg Intravenous Once  . rosuvastatin  20 mg Oral q1800  . sodium chloride  3 mL Intravenous Q12H    OBJECTIVE   Intake/Output Summary (Last 24 hours) at 11/25/14 0938 Last data filed at 11/25/14 0857  Gross per 24 hour  Intake   1083 ml  Output    500 ml  Net    583 ml   Filed Weights   11/23/14 2121 11/24/14 0613 11/25/14 0612  Weight: 78.744 kg (173 lb 9.6 oz) 80 kg (176 lb 5.9 oz) 78.926 kg (174 lb)    PHYSICAL EXAM Filed Vitals:   11/24/14 2035 11/25/14 0130 11/25/14 0612 11/25/14 0855  BP: 142/73 149/80 152/74 131/61  Pulse: 72 74 77 72  Temp: 98 F (36.7 C) 98.5 F (36.9 C) 99.2 F (37.3 C) 98.1 F (36.7 C)  TempSrc: Oral Oral Oral Oral  Resp: Height:      Weight:   78.926 kg (174 lb)   SpO2: 99% 98% 95% 98%   General: Alert, oriented x3, no distress Head: no evidence of trauma, PERRL, EOMI, no exophtalmos or lid lag, no myxedema, no xanthelasma; normal ears, nose and oropharynx Neck: normal jugular venous pulsations and no hepatojugular reflux; brisk carotid pulses without delay and no carotid bruits Chest: clear to auscultation, no signs of consolidation by percussion or palpation, normal fremitus, symmetrical and full respiratory  excursions Cardiovascular: normal position and quality of the apical impulse, regular rhythm, normal first and second heart sounds, no rubs or gallops, no murmur Abdomen: no tenderness or distention, no masses by palpation, no abnormal pulsatility or arterial bruits, normal bowel sounds, no hepatosplenomegaly Extremities: no clubbing, cyanosis or edema; 2+ radial, ulnar and brachial pulses bilaterally; 2+ right femoral, posterior tibial and dorsalis pedis pulses; 2+ left femoral, posterior tibial and dorsalis pedis pulses; no subclavian or femoral bruits Neurological: grossly nonfocal  LABS  CBC  Recent Labs  11/23/14 1118 11/24/14 0356  WBC 5.2 5.9  HGB 10.9* 10.2*  HCT 34.1* 31.9*  MCV 94.5 93.5  PLT 172 163   Basic Metabolic Panel  Recent Labs  11/23/14 1118 11/24/14 0356  NA 143 142  K 4.8 4.6  CL 114* 113*  CO2 22 20  GLUCOSE 169* 140*  BUN 45* 48*  CREATININE 3.26* 3.36*  CALCIUM 9.3 9.1   Liver Function Tests No results for input(s): AST, ALT, ALKPHOS, BILITOT, PROT, ALBUMIN in the last 72 hours. No results for input(s): LIPASE, AMYLASE in the last 72 hours. Cardiac Enzymes  Recent Labs  11/23/14 2251 11/24/14 0356 11/24/14 0958  TROPONINI <0.03 <0.03 <0.03   Recent Labs  11/23/14 2251  TSH 0.754  Radiology Studies Imaging results have been reviewed and X-ray Chest Pa And Lateral  11/24/2014   CLINICAL DATA:  Acute shortness of breath with chest pain since yesterday. History of CHF  EXAM: CHEST  2 VIEW  COMPARISON:  11/23/2014  FINDINGS: Stable cardiomegaly with diffuse interstitial edema pattern predominantly in the lower lobes with a similar small right effusion. There is associated basilar atelectasis. No significant interval change. No pneumothorax. Trachea is midline. Atherosclerosis noted of the aorta.  IMPRESSION: Stable CHF pattern.  Small right effusion and basilar atelectasis.   Electronically Signed   By: Judie Petit.  Shick M.D.   On: 11/24/2014 08:02    Dg Chest 2 View (if Patient Has Fever And/or Copd)  11/23/2014   CLINICAL DATA:  Mid to left-sided chest pain for 1 day.  EXAM: CHEST  2 VIEW  COMPARISON:  09/03/2014  FINDINGS: Chronic cardiomegaly. There are new small pleural effusions and interstitial/fissural thickening. Asymmetric density at the right base is likely fissural fluid rather than pneumonia. Stable aortic and hilar contours.  IMPRESSION: CHF.   Electronically Signed   By: Tiburcio Pea M.D.   On: 11/23/2014 12:43   Nm Myocar Multi W/spect W/wall Motion / Ef  11/24/2014   CLINICAL DATA:  Chest pain  EXAM: MYOCARDIAL IMAGING WITH SPECT (REST AND PHARMACOLOGIC-STRESS)  GATED LEFT VENTRICULAR WALL MOTION STUDY  LEFT VENTRICULAR EJECTION FRACTION  TECHNIQUE: Standard myocardial SPECT imaging was performed after resting intravenous injection of 10 mCi Tc-13m sestamibi. Subsequently, intravenous infusion of Lexiscan was performed under the supervision of the Cardiology staff. At peak effect of the drug, 30 mCi Tc-64m sestamibi was injected intravenously and standard myocardial SPECT imaging was performed. Quantitative gated imaging was also performed to evaluate left ventricular wall motion, and estimate left ventricular ejection fraction.  COMPARISON:  None.  FINDINGS: Baseline EKG shows NSR with ST/T wave abnormality in the inferolateral leads. During Lexiscan infusion, there was downsloping of the ST segments in the inferolateral leads. EKG nondiagnostic due to baseline EKG changes.  RAW images show increased gut uptake below the diaphragm. There is also diaphragmatic attenuation.  Perfusion: There is a moderate sized, moderate intensity defect involving the entire inferior wall. The defect is mainly fixed with a very small area of reversibility in the inferoapex. There is a moderate sized, moderate intensity defect in the inferolateral wall that is partially fixed with a moderate area of reversibility in the mid and apical inferolateral wall.   Wall Motion: Moderate to severely reduced LVF with diffuse hypokinesis.  Left Ventricular Ejection Fraction: 39 %  End diastolic volume 141 ml  End systolic volume 86 ml  IMPRESSION: 1. There is a moderate sized, moderate intensity defect involving the entire inferior wall consistent with prior infarct with a small area of peri infarct ischemia in the inferoapex. There is a moderate sized, moderate intensity defect in the inferolateral wall consistent with prior infarct with a moderate area of ischemia in the mid and apical inferolateral wall.  2.  Moderately to severely reduced LVF with diffuse hypokinesis.  3. Left ventricular ejection fraction 39%  4. High-risk stress test findings*.  *2012 Appropriate Use Criteria for Coronary Revascularization Focused Update: J Am Coll Cardiol. 2012;59(9):857-881. http://content.dementiazones.com.aspx?articleid=1201161   Electronically Signed   By: Armanda Magic   On: 11/24/2014 17:26    TELE NSR  ECG NSR, no Q waves in inferior leads  ASSESSMENT AND PLAN  Nuclear scan findings suggest occlusion of the right coronary artery with infarction (or at least a  very high grade stenosis with ischemia at rest). The left coronary artery distribution appears to have normal ischemia (although left main stenosis reported as 40% in past could create a "balanced ischemia" pattern in the anterior and lateral walls). Had a very lengthy discussion regarding the risks of cardiac cath. It is likely that there would be little benefit to the procedure since there is no reversible ischemia. There is a very substantial risk of contrast -induced renal injury and progression to ESRD. He has long made up his mind that he will never go on hemodialysis since he does not want that decrement in quality of life. He prefers to "die naturally". I recommend medical therapy. Also advised that he still seek urgent attention for chest pain > 30 minutes. Discussed signs and symptoms of heart  failure, sodium restriction and daily weights in great detail. He has both LV systolic dysfunction and advanced renal disease - volume management needs to be meticulous. Echo today may help fine-tune our understanding of his volume status.  Possible DC today based on echo results.  Thurmon FairMihai Wilmoth Rasnic, MD, [facility] [phone] office [phone] pager 11/25/2014 9:38 AM

## 2014-12-01 ENCOUNTER — Telehealth: Payer: Self-pay | Admitting: Cardiology

## 2014-12-07 NOTE — Telephone Encounter (Signed)
Closed encounter °

## 2014-12-08 ENCOUNTER — Telehealth: Payer: Self-pay | Admitting: Cardiology

## 2014-12-08 DIAGNOSIS — N183 Chronic kidney disease, stage 3 unspecified: Secondary | ICD-10-CM

## 2014-12-08 DIAGNOSIS — I10 Essential (primary) hypertension: Secondary | ICD-10-CM

## 2014-12-08 DIAGNOSIS — N184 Chronic kidney disease, stage 4 (severe): Secondary | ICD-10-CM

## 2014-12-08 MED ORDER — FUROSEMIDE 40 MG PO TABS: 80.0000 mg | ORAL_TABLET | Freq: Every day | ORAL | Status: DC

## 2014-12-08 NOTE — Telephone Encounter (Signed)
Spoke with pt, he reports he is getting SOB again. He is SOB walking from the bedroom to the bathroom or kitchen. He denies edema or weight change. He does report orthopnea last night and had to prop himself up in bed. His current lasix dosage is 60 mg daily. Will forward for dr Jens Somcrenshaw review

## 2014-12-08 NOTE — Telephone Encounter (Signed)
Spoke with pt, Aware of dr crenshaw's recommendations.  °

## 2014-12-08 NOTE — Telephone Encounter (Signed)
Change lasix to 80 mg daily; bmet one week; needs fu with nephrology Olga MillersBrian Crenshaw

## 2014-12-08 NOTE — Telephone Encounter (Signed)
[name] [name] is calling because she is getting shortness of breath again . Has an appt on 12/29/14.Marland Kitchen. Please call    Thanks

## 2014-12-10 ENCOUNTER — Emergency Department (HOSPITAL_COMMUNITY): Payer: Medicare Other

## 2014-12-10 ENCOUNTER — Emergency Department (HOSPITAL_COMMUNITY)
Admission: EM | Admit: 2014-12-10 | Discharge: 2014-12-10 | Disposition: A | Discharge status: Home / Self Care | Payer: Medicare Other | Attending: Emergency Medicine | Admitting: Emergency Medicine

## 2014-12-10 ENCOUNTER — Encounter (HOSPITAL_COMMUNITY): Payer: Self-pay | Admitting: *Deleted

## 2014-12-10 DIAGNOSIS — N184 Chronic kidney disease, stage 4 (severe): Secondary | ICD-10-CM

## 2014-12-10 DIAGNOSIS — Z8673 Personal history of transient ischemic attack (TIA), and cerebral infarction without residual deficits: Secondary | ICD-10-CM | POA: Insufficient documentation

## 2014-12-10 DIAGNOSIS — E785 Hyperlipidemia, unspecified: Secondary | ICD-10-CM | POA: Insufficient documentation

## 2014-12-10 DIAGNOSIS — M109 Gout, unspecified: Secondary | ICD-10-CM | POA: Insufficient documentation

## 2014-12-10 DIAGNOSIS — I120 Hypertensive chronic kidney disease with stage 5 chronic kidney disease or end stage renal disease: Secondary | ICD-10-CM | POA: Insufficient documentation

## 2014-12-10 DIAGNOSIS — I509 Heart failure, unspecified: Secondary | ICD-10-CM | POA: Diagnosis not present

## 2014-12-10 DIAGNOSIS — E119 Type 2 diabetes mellitus without complications: Secondary | ICD-10-CM | POA: Insufficient documentation

## 2014-12-10 DIAGNOSIS — Z7982 Long term (current) use of aspirin: Secondary | ICD-10-CM | POA: Diagnosis not present

## 2014-12-10 DIAGNOSIS — Z79899 Other long term (current) drug therapy: Secondary | ICD-10-CM | POA: Insufficient documentation

## 2014-12-10 DIAGNOSIS — I5023 Acute on chronic systolic (congestive) heart failure: Secondary | ICD-10-CM | POA: Diagnosis not present

## 2014-12-10 DIAGNOSIS — I2510 Atherosclerotic heart disease of native coronary artery without angina pectoris: Secondary | ICD-10-CM | POA: Insufficient documentation

## 2014-12-10 DIAGNOSIS — Z87891 Personal history of nicotine dependence: Secondary | ICD-10-CM | POA: Diagnosis not present

## 2014-12-10 DIAGNOSIS — N289 Disorder of kidney and ureter, unspecified: Secondary | ICD-10-CM

## 2014-12-10 DIAGNOSIS — R0602 Shortness of breath: Secondary | ICD-10-CM | POA: Diagnosis present

## 2014-12-10 DIAGNOSIS — E039 Hypothyroidism, unspecified: Secondary | ICD-10-CM | POA: Diagnosis not present

## 2014-12-10 DIAGNOSIS — N185 Chronic kidney disease, stage 5: Secondary | ICD-10-CM | POA: Diagnosis not present

## 2014-12-10 LAB — I-STAT TROPONIN, ED: TROPONIN I, POC: 0.01 ng/mL (ref 0.00–0.08)

## 2014-12-10 LAB — CBC WITH DIFFERENTIAL/PLATELET
BASOS ABS: 0 10*3/uL (ref 0.0–0.1)
BASOS PCT: 0 % (ref 0–1)
Eosinophils Absolute: 0.4 10*3/uL (ref 0.0–0.7)
Eosinophils Relative: 8 % — ABNORMAL HIGH (ref 0–5)
HEMATOCRIT: 33.2 % — AB (ref 39.0–52.0)
Hemoglobin: 10.6 g/dL — ABNORMAL LOW (ref 13.0–17.0)
Lymphocytes Relative: 23 % (ref 12–46)
Lymphs Abs: 1.1 10*3/uL (ref 0.7–4.0)
MCH: 30.5 pg (ref 26.0–34.0)
MCHC: 31.9 g/dL (ref 30.0–36.0)
MCV: 95.4 fL (ref 78.0–100.0)
MONO ABS: 0.4 10*3/uL (ref 0.1–1.0)
Monocytes Relative: 9 % (ref 3–12)
NEUTROS ABS: 2.7 10*3/uL (ref 1.7–7.7)
Neutrophils Relative %: 60 % (ref 43–77)
PLATELETS: 153 10*3/uL (ref 150–400)
RBC: 3.48 MIL/uL — ABNORMAL LOW (ref 4.22–5.81)
RDW: 14.4 % (ref 11.5–15.5)
WBC: 4.6 10*3/uL (ref 4.0–10.5)

## 2014-12-10 LAB — COMPREHENSIVE METABOLIC PANEL
ALBUMIN: 3.6 g/dL (ref 3.5–5.2)
ALT: 13 U/L (ref 0–53)
ANION GAP: 10 (ref 5–15)
AST: 17 U/L (ref 0–37)
Alkaline Phosphatase: 38 U/L — ABNORMAL LOW (ref 39–117)
BILIRUBIN TOTAL: 0.9 mg/dL (ref 0.3–1.2)
BUN: 54 mg/dL — AB (ref 6–23)
CALCIUM: 9.2 mg/dL (ref 8.4–10.5)
CO2: 19 mmol/L (ref 19–32)
Chloride: 111 mmol/L (ref 96–112)
Creatinine, Ser: 3.24 mg/dL — ABNORMAL HIGH (ref 0.50–1.35)
GFR calc Af Amer: 20 mL/min — ABNORMAL LOW (ref >90)
GFR, EST NON AFRICAN AMERICAN: 17 mL/min — AB (ref >90)
GLUCOSE: 124 mg/dL — AB (ref 70–99)
POTASSIUM: 4.7 mmol/L (ref 3.5–5.1)
SODIUM: 140 mmol/L (ref 135–145)
Total Protein: 6.8 g/dL (ref 6.0–8.3)

## 2014-12-10 LAB — BRAIN NATRIURETIC PEPTIDE: B Natriuretic Peptide: 326.5 pg/mL — ABNORMAL HIGH (ref 0.0–100.0)

## 2014-12-10 MED ORDER — ISOSORBIDE MONONITRATE ER 60 MG PO TB24
60.0000 mg | ORAL_TABLET | Freq: Every day | ORAL | Status: DC
  Administered 2014-12-10: 60 mg via ORAL
  Filled 2014-12-10: qty 1

## 2014-12-10 MED ORDER — FUROSEMIDE 10 MG/ML IJ SOLN
40.0000 mg | Freq: Once | INTRAMUSCULAR | Status: AC
  Administered 2014-12-10: 40 mg via INTRAVENOUS
  Filled 2014-12-10: qty 4

## 2014-12-10 MED ORDER — CARVEDILOL 6.25 MG PO TABS
6.2500 mg | ORAL_TABLET | Freq: Two times a day (BID) | ORAL | Status: DC
  Administered 2014-12-10: 6.25 mg via ORAL
  Filled 2014-12-10 (×3): qty 1

## 2014-12-10 MED ORDER — CARVEDILOL 6.25 MG PO TABS
6.2500 mg | ORAL_TABLET | Freq: Two times a day (BID) | ORAL | Status: DC
  Filled 2014-12-10 (×2): qty 1

## 2014-12-10 MED ORDER — CARVEDILOL 12.5 MG PO TABS
12.5000 mg | ORAL_TABLET | Freq: Two times a day (BID) | ORAL | Status: DC
  Filled 2014-12-10 (×2): qty 1

## 2014-12-10 MED ORDER — HYDRALAZINE HCL 50 MG PO TABS
50.0000 mg | ORAL_TABLET | Freq: Three times a day (TID) | ORAL | Status: DC
  Administered 2014-12-10: 50 mg via ORAL
  Filled 2014-12-10 (×3): qty 1

## 2014-12-10 NOTE — ED Notes (Signed)
Patient states he was admitted to the hospital on 2/8 for c/o chest pain and was told he had some " heart muscle damage" . Patient states ;he has been sob since his discharge from the hospital however wife states it actually was worse yest . Where patient wasn't able to lay down. States he is to sob .Denies chest pain

## 2014-12-10 NOTE — ED Provider Notes (Signed)
Patient rechecked at 14:00.... He is hemodynamically stable. No new dyspnea or chest pain. Discharge instructions per cardiologist.  Donnetta HutchingBrian Leilene Diprima, MD 12/10/14 [phone]

## 2014-12-10 NOTE — ED Notes (Signed)
Patient return from x-ray 

## 2014-12-10 NOTE — Consult Note (Signed)
Advanced Heart Failure Team Consult Note  Referring Physician: Dr Judd Lien  Primary Physician: Primary Cardiologist:  Dr [name] Nephrology: Dr Briant Cedar  Reason for Consultation: Heart Failure   HPI:   Mr Caudillo is a 79 year old with history of HTN, HLD, chronic systolic CHF, DM type II, CKD stage 4, Gout, and CAD (s/p PCI to mid and mid-distal RCA, and 40% stenosis L main, no sig dz LCx).   Recently admitted to [facility] 11/23/14 with chest pain and underwent Myoview on 11/24/2014 which showed EF 39%, moderately to severely reduced LVEF with diffuse hypokinesis. Moderate dized, moderate intensity defect involving entire inferior wall consistent with prior infarct with small area of peri infarct ischemia in inferoapex, moderate sized to moderate intensity defect in the inferolateral wall consistent with primary infarct with moderate area of ischemia in the mid and apical inferolateral wall. Concern for RCA infarction but given renal failure cardiac cath was not pursued at the patients request due to to concern for contrast nephropathy. ECHO was performed and showed EF 40-45% severe hypokinesis of inferior and inferoseptal myocardium.   He was discharged on lasix 60 mg daily. Discharge weight 174 pounds.   He initially did well after discharge but over the last few days he developed progressive dyspnea . Yesterday Dr [name] instructed him to take an additional 40 mg of lasix (was on 60 mg lasix daily) . He had no improvement and in fact felt worse over night. + PND/Orthopnea. He says his weight has been 170-174 pounds. Taking all medications. He has been actively followed by Dr Briant Cedar and says he has discussed dialysis with him and he declined dialysis.   Today he presented to [facility] ED with worsening dyspnea. Denies chest pain.  CXR showed persistent CHF/pulmonary edmea. BNP is down from previous admit 592>326. He wa given 40 mg IV lasix.   Pertinent admission labs included: Creatinine 3.24 BUN 54 BNP 326  Hgb 10.5    Review of Systems: [y] = yes, [ ]  = no   General: Weight gain [ ] ; Weight loss [ ] ; Anorexia [ ] ; Fatigue [Y ]; Fever [ ] ; Chills [ ] ; Weakness [ ]   Cardiac: Chest pain/pressure [ ] ; Resting SOB [T ]; Exertional SOB [Y ]; Orthopnea [Y ]; Pedal Edema [ ] ; Palpitations [ ] ; Syncope [ ] ; Presyncope [ ] ; Paroxysmal nocturnal dyspnea[ ]   Pulmonary: Cough [ ] ; Wheezing[ ] ; Hemoptysis[ ] ; Sputum [ ] ; Snoring [ ]   GI: Vomiting[ ] ; Dysphagia[ ] ; Melena[ ] ; Hematochezia [ ] ; Heartburn[ ] ; Abdominal pain [ ] ; Constipation [ ] ; Diarrhea [ ] ; BRBPR [ ]   GU: Hematuria[ ] ; Dysuria [ ] ; Nocturia[ ]   Vascular: Pain in legs with walking [ ] ; Pain in feet with lying flat [ ] ; Non-healing sores [ ] ; Stroke [ ] ; TIA [ ] ; Slurred speech [ ] ;  Neuro: Headaches[ ] ; Vertigo[ ] ; Seizures[ ] ; Paresthesias[ ] ;Blurred vision [ ] ; Diplopia [ ] ; Vision changes [ ]   Ortho/Skin: Arthritis [ ] ; Joint pain [Y ]; Muscle pain [ ] ; Joint swelling [ ] ; Back Pain [ ] ; Rash [ ]   Psych: Depression[ ] ; Anxiety[ ]   Heme: Bleeding problems [ ] ; Clotting disorders [ ] ; Anemia [ ]   Endocrine: Diabetes [ ] ; Thyroid dysfunction[ ]   Home Medications Prior to Admission medications   Medication Sig Start Date End Date Taking? Authorizing Provider  acetaminophen (TYLENOL) 500 MG tablet Take 500 mg by mouth every 6 (six) hours as needed.   Yes Historical Provider, MD  amLODipine (  NORVASC) 5 MG tablet Take 1 tablet (5 mg total) by mouth 2 (two) times daily. 11/16/14  Yes Lewayne BuntingBrian S Crenshaw, MD  aspirin 325 MG tablet Take 325 mg by mouth daily.     Yes Historical Provider, MD  calcitRIOL (ROCALTROL) 0.25 MCG capsule 0.25 mcg. Take one cap by mouth on odd days and 2 caps on even days.    Yes Historical Provider, MD  carvedilol (COREG) 12.5 MG tablet Take 1 tablet (12.5 mg total) by mouth 2 (two) times daily with a meal. 02/16/14  Yes Stacie GlazeJohn E Jenkins, MD  chlorzoxazone (PARAFON) 500 MG tablet Take 1 tablet (500 mg total) by mouth 3 (three)  times daily as needed for muscle spasms. 09/18/14  Yes Shelva MajesticStephen O Hunter, MD  Cod Liver Oil OIL Take 15 mLs by mouth daily.   Yes Historical Provider, MD  colchicine 0.6 MG tablet Take 0.5 tablets (0.3 mg total) by mouth daily. 09/06/13  Yes Ripudeep Jenna LuoK Rai, MD  furosemide (LASIX) 80 MG tablet Take 40 mg by mouth daily. 11/16/14  Yes Historical Provider, MD  glyBURIDE (DIABETA) 5 MG tablet Take 5 mg by mouth daily with breakfast. Take a 1/2 pill before breakfast and a whole pill before 2nd meal of the day.   Yes Historical Provider, MD  hydrALAZINE (APRESOLINE) 50 MG tablet Take 1 tablet (50 mg total) by mouth 3 (three) times daily. 09/18/14  Yes Shelva MajesticStephen O Hunter, MD  HYDROcodone-acetaminophen (NORCO/VICODIN) 5-325 MG per tablet Take 1 tablet by mouth every 6 (six) hours as needed for moderate pain. 09/18/14  Yes Shelva MajesticStephen O Hunter, MD  isosorbide mononitrate (IMDUR) 60 MG 24 hr tablet TAKE 1 TABLET BY MOUTH DAILY. 03/03/14  Yes Stacie GlazeJohn E Jenkins, MD  levothyroxine (SYNTHROID) 200 MCG tablet Take 1 tablet (200 mcg total) by mouth daily. 09/16/14  Yes Shelva MajesticStephen O Hunter, MD  olopatadine (PATANOL) 0.1 % ophthalmic solution Place 1 drop into both eyes 2 (two) times daily. 03/31/14  Yes Stacie GlazeJohn E Jenkins, MD  rosuvastatin (CRESTOR) 20 MG tablet Take 1 tablet (20 mg total) by mouth daily. 04/13/14  Yes Toniann KetMatthew K Tucker, PA-C  furosemide (LASIX) 40 MG tablet Take 2 tablets (80 mg total) by mouth daily. Patient not taking: Reported on 12/10/2014 12/08/14   Lewayne BuntingBrian S Crenshaw, MD  nitroGLYCERIN (NITROSTAT) 0.4 MG SL tablet Place 1 tablet (0.4 mg total) under the tongue every 5 (five) minutes as needed for chest pain. 11/25/14 04/25/17  [name], PA    Past Medical History: Past Medical History  Diagnosis Date  . CAD (coronary artery disease)     a. s/p PCI to mid and mid-distal RCA, 40% sten L main, no sig dz LCx  b. myoview 11/24/2014 inferoapex ischemia, medical management due to high risk for contrast nephropathy  . HTN  (hypertension)   . Hyperlipidemia   . Cerebrovascular accident   . CHF (congestive heart failure)     EF 35% echo 2012 ; EF 44% myoview 2012; [facility]  . Diabetes mellitus   . 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 Unspecified hypothyroidism   . Gout     Past Surgical History: Past Surgical History  Procedure Laterality Date  . Thyroidectomy      Family History: Family History  Problem Relation Age of Onset  . Kidney failure Mother   . Diabetes Mother   . Cancer Father     lung  . Heart attack Brother  massive heart attack in 50s  . Hypertension Mother     Social History: History   Social History  . Marital Status: Married    Spouse Name: N/A  . Number of Children: N/A  . Years of Education: N/A   Social History Main Topics  . Smoking status: Former Smoker -- 0.50 packs/day    Types: Cigarettes    Quit date: 10/16/1953  . Smokeless tobacco: Not on file     Comment: quit in the 70's  . Alcohol Use: No  . Drug Use: No  . Sexual Activity: Not Currently   Other Topics Concern  . None   Social History Narrative   Lives in [place] with his wife and has 6 kids.     Allergies:  Allergies  Allergen Reactions  . Tomato     Too much acid    Objective:    Vital Signs:   Temp:  [97.9 F (36.6 C)] 97.9 F (36.6 C) (02/25 0523) Pulse Rate:  [59-78] 60 (02/25 0930) Resp:  [14-31] 31 (02/25 0930) BP: (142-154)/(56-82) 151/70 mmHg (02/25 0930) SpO2:  [86 %-99 %] 97 % (02/25 0930) Weight:  [170 lb (77.111 kg)] 170 lb (77.111 kg) (02/25 0624)    Weight change: Filed Weights   12/10/14 0523 12/10/14 0624  Weight: 170 lb (77.111 kg) 170 lb (77.111 kg)    Intake/Output:   Intake/Output Summary (Last 24 hours) at 12/10/14 1007 Last data filed at 12/10/14 0941  Gross per 24 hour  Intake      0 ml  Output    300 ml  Net   -300 ml     Physical Exam: General:  Well appearing. No resp difficulty HEENT: normal Neck:  supple. JVP 9-10  . Carotids 2+ bilat; no bruits. No lymphadenopathy or thryomegaly appreciated. Cor: PMI nondisplaced. Regular rate & rhythm. No rubs, gallops or murmurs. Lungs: clear Abdomen: soft, nontender, nondistended. No hepatosplenomegaly. No bruits or masses. Good bowel sounds. Extremities: no cyanosis, clubbing, rash, edema Neuro: alert & orientedx3, cranial nerves grossly intact. moves all 4 extremities w/o difficulty. Affect pleasant  Telemetry: NSR 60s   Labs: Basic Metabolic Panel:  Recent Labs Lab 12/10/14 0530  NA 140  K 4.7  CL 111  CO2 19  GLUCOSE 124*  BUN 54*  CREATININE 3.24*  CALCIUM 9.2    Liver Function Tests:  Recent Labs Lab 12/10/14 0530  AST 17  ALT 13  ALKPHOS 38*  BILITOT 0.9  PROT 6.8  ALBUMIN 3.6   No results for input(s): LIPASE, AMYLASE in the last 168 hours. No results for input(s): AMMONIA in the last 168 hours.  CBC:  Recent Labs Lab 12/10/14 0530  WBC 4.6  NEUTROABS 2.7  HGB 10.6*  HCT 33.2*  MCV 95.4  PLT 153    Cardiac Enzymes: No results for input(s): CKTOTAL, CKMB, CKMBINDEX, TROPONINI in the last 168 hours.  BNP: BNP (last 3 results)  Recent Labs  11/23/14 1532 12/10/14 0530  BNP 592.3* 326.5*    ProBNP (last 3 results)  Recent Labs  09/03/14 1337  PROBNP 125.0*     CBG: No results for input(s): GLUCAP in the last 168 hours.  Coagulation Studies: No results for input(s): LABPROT, INR in the last 72 hours.  Other results: EKG: SR 1st degree HB.  63 BPM  Imaging: Dg Chest 2 View  12/10/2014   CLINICAL DATA:  Shortness of breath for 1 week.  EXAM: CHEST  2 VIEW  COMPARISON:  11/24/2014  FINDINGS: Cardiomegaly is again seen, not significantly changed. Decreased subpulmonic pleural effusions. Fluid in the right minor fissure is unchanged. Diminished with persistent right basilar opacity. Interstitial edema is grossly unchanged. No pneumothorax. No acute osseous abnormality is seen.  IMPRESSION:  1. Persistent CHF. Cardiomegaly and pulmonary edema is unchanged, pleural effusions have diminished. 2. Persistent but improved right basilar opacity, likely atelectasis.   Electronically Signed   By: Rubye Oaks M.D.   On: 12/10/2014 06:23      Medications:     Current Medications:    Infusions:     Assessment/Plan    1. Dyspnea- mild volume overload 2. A/C Systolic Heart Failure- ICM. ECHO EF 40-45% -  On exam he does not appear overly fluid overloaded however CXR with increased pulmonary edema. Received 40 mg IV lasix this am.  Continue Coreg and hydralazine 50 mg tid + imdur 60 mg daily. No Ace/Spiro/Dig with CKD.  3. CAD- 12/2003 40% left main, no significant disease in the circumflex, total occlusion of the ramus branch with a 50% first marginal, 40% proximal, followed by 95% mid, and 90% mid distal stenosis in the right coronary artery with successful PCI of the right coronary artery both the mid and mid-to- distal right lesions. Most recent admit he had Nuclear Stress test on 11/24/2014 with large inferior wall scar with minimal reversibility but also a moderate inferolateral reversible defect that may suggest ischemia and a substantial drop in LVEF from before (39%). Cardiac cath was offered however he declined due to CKD. No chest discomfort reported today.  On aspirin, crestor, and carvedilol.  4. CKD; Creatinine baseline 3.2-3.4. Patient of Dr Briant Cedar. In the past he has declined HD  5. Hypothyroidism - on Synthorid 6. HTN -  Restart home meds now.   Can admit for observation.   Length of Stay:   CLEGG,AMY NP-C  12/10/2014, 10:07 AM  Advanced Heart Failure Team Pager [phone] (M-F; 7a - 4p)  Please contact [facility] for night-coverage after hours (4p -7a ) and weekends on [email or web address]  Patient seen with NP, agree with the above note.  1. Acute on chronic systolic CHF: EF 62-13%, ischemic cardiomyopathy.  He has been taking Lasix 60 mg daily at home.  For  the last 2 days, he has been more short of breath (dyspneic walking in the house).  He came to the ER for evaluation.  He has had Lasix 40 mg IV x 1 earlier today and feels much better.  - Will give Lasix 40 mg IV x 1 again before discharge.  - I think he can go home, will change home Lasix regimen to 80 qam/40 qpm x 4 days then 80 mg daily.  - Continue Coreg and hydralazine/imdur as prior to admission.  - He will get followup in CHF clinic next week for re-evaluation  2. CAD: Cardiolite earlier this month with inferior scar and inferolateral ischemia.  He has not had any further chest pain. Discussion at last admission => determined no cath due to CKD IV.   3. CKD: Stage IV, stable. Will need to follow closely with diuresis.   Marca Ancona 12/10/2014 1:09 PM

## 2014-12-10 NOTE — Discharge Instructions (Signed)
Heart Failure °Heart failure means your heart has trouble pumping blood. This makes it hard for your body to work well. Heart failure is usually a long-term (chronic) condition. You must take good care of yourself and follow your doctor's treatment plan. °HOME CARE °· Take your heart medicine as told by your doctor. °¨ Do not stop taking medicine unless your doctor tells you to. °¨ Do not skip any dose of medicine. °¨ Refill your medicines before they run out. °¨ Take other medicines only as told by your doctor or pharmacist. °· Stay active if told by your doctor. The elderly and people with severe heart failure should talk with a doctor about physical activity. °· Eat heart-healthy foods. Choose foods that are without trans fat and are low in saturated fat, cholesterol, and salt (sodium). This includes fresh or frozen fruits and vegetables, fish, lean meats, fat-free or low-fat dairy foods, whole grains, and high-fiber foods. Lentils and dried peas and beans (legumes) are also good choices. °· Limit salt if told by your doctor. °· Mujahid Jalomo in a healthy way. Roast, grill, broil, bake, poach, steam, or stir-fry foods. °· Limit fluids as told by your doctor. °· Weigh yourself every morning. Do this after you pee (urinate) and before you eat breakfast. Write down your weight to give to your doctor. °· Take your blood pressure and write it down if your doctor tells you to. °· Ask your doctor how to check your pulse. Check your pulse as told. °· Lose weight if told by your doctor. °· Stop smoking or chewing tobacco. Do not use gum or patches that help you quit without your doctor's approval. °· Schedule and go to doctor visits as told. °· Nonpregnant women should have no more than 1 drink a day. Men should have no more than 2 drinks a day. Talk to your doctor about drinking alcohol. °· Stop illegal drug use. °· Stay current with shots (immunizations). °· Manage your health conditions as told by your doctor. °· Learn to  manage your stress. °· Rest when you are tired. °· If it is really hot outside: °¨ Avoid intense activities. °¨ Use air conditioning or fans, or get in a cooler place. °¨ Avoid caffeine and alcohol. °¨ Wear loose-fitting, lightweight, and light-colored clothing. °· If it is really cold outside: °¨ Avoid intense activities. °¨ Layer your clothing. °¨ Wear mittens or gloves, a hat, and a scarf when going outside. °¨ Avoid alcohol. °· Learn about heart failure and get support as needed. °· Get help to maintain or improve your quality of life and your ability to care for yourself as needed. °GET HELP IF:  °· You gain 03 lb/1.4 kg or more in 1 day or 05 lb/2.3 kg in a week. °· You are more short of breath than usual. °· You cannot do your normal activities. °· You tire easily. °· You cough more than normal, especially with activity. °· You have any or more puffiness (swelling) in areas such as your hands, feet, ankles, or belly (abdomen). °· You cannot sleep because it is hard to breathe. °· You feel like your heart is beating fast (palpitations). °· You get dizzy or light-headed when you stand up. °GET HELP RIGHT AWAY IF:  °· You have trouble breathing. °· There is a change in mental status, such as becoming less alert or not being able to focus. °· You have chest pain or discomfort. °· You faint. °MAKE SURE YOU:  °· Understand these instructions. °·   Will watch your condition.  Will get help right away if you are not doing well or get worse. Document Released: 07/11/2008 Document Revised: 02/16/2014 Document Reviewed: 11/18/2012 [facility] Patient Information 2015 [employer] [place]. This information is not intended to replace advice given to you by your health care provider. Make sure you discuss any questions you have with your health care provider.    Increase Lasix to 80 mg in the morning and 40 mg in the evening for the next 4 days, then take 80 mg daily in the morning only. Follow-up with cardiologist on March  1 as noted in your discharge instruction. Decrease salt in your diet. Rest. Elevate legs.

## 2014-12-10 NOTE — ED Provider Notes (Signed)
CSN: [id number]     Arrival date & time 12/10/14  1191 History   First MD Initiated Contact with Patient 12/10/14 0518     No chief complaint on file.    (Consider location/radiation/quality/duration/timing/severity/associated sxs/prior Treatment) HPI Comments: Patient is a 79 year old male with history of coronary artery disease with stent, CHF, type 2 diabetes. He presents for evaluation of shortness of breath that is been worsening over the past several days. He denies any chest pain, fever, or chills. He denies any productive cough. He becomes more short of breath with ambulation and also states that it is worse when he lies down to sleep at night.  Patient is a 79 y.o. male presenting with shortness of breath. The history is provided by the patient.  Shortness of Breath Severity:  Moderate Onset quality:  Gradual Duration:  3 days Timing:  Constant Progression:  Worsening Chronicity:  New Context: activity   Relieved by:  Nothing Worsened by:  Nothing tried Ineffective treatments:  None tried Associated symptoms: no chest pain, no cough and no fever     Past Medical History  Diagnosis Date  . CAD (coronary artery disease)     a. s/p PCI to mid and mid-distal RCA, 40% sten L main, no sig dz LCx  b. myoview 11/24/2014 inferoapex ischemia, medical management due to high risk for contrast nephropathy  . HTN (hypertension)   . Hyperlipidemia   . Cerebrovascular accident   . CHF (congestive heart failure)     EF 35% echo 2012 ; EF 44% myoview 2012; EF 76% by [facility] November 2014  . Diabetes mellitus   . 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 Unspecified hypothyroidism   . Gout    Past Surgical History  Procedure Laterality Date  . Thyroidectomy     Family History  Problem Relation Age of Onset  . Kidney failure Mother   . Diabetes Mother   . Cancer Father     lung  . Heart attack Brother     massive heart attack in 8s  . Hypertension Mother    History   Substance Use Topics  . Smoking status: Former Smoker -- 0.50 packs/day    Types: Cigarettes    Quit date: 10/16/1953  . Smokeless tobacco: Not on file     Comment: quit in the 70's  . Alcohol Use: No    Review of Systems  Constitutional: Negative for fever.  Respiratory: Positive for shortness of breath. Negative for cough.   Cardiovascular: Negative for chest pain.  All other systems reviewed and are negative.     Allergies  Tomato  Home Medications   Prior to Admission medications   Medication Sig Start Date End Date Taking? Authorizing Provider  acetaminophen (TYLENOL) 500 MG tablet Take 500 mg by mouth every 6 (six) hours as needed.    Historical Provider, MD  amLODipine (NORVASC) 5 MG tablet Take 1 tablet (5 mg total) by mouth 2 (two) times daily. 11/16/14   [name], MD  aspirin 325 MG tablet Take 325 mg by mouth daily.      Historical Provider, MD  calcitRIOL (ROCALTROL) 0.25 MCG capsule 0.25 mcg. Take one cap by mouth on odd days and 2 caps on even days.     Historical Provider, MD  carvedilol (COREG) 12.5 MG tablet Take 1 tablet (12.5 mg total) by mouth 2 (two) times daily with a meal. 02/16/14   Stacie Glaze, MD  chlorzoxazone (PARAFON) 500 MG tablet Take 1 tablet (500 mg total) by mouth 3 (three) times daily as needed for muscle spasms. 09/18/14   Shelva MajesticStephen O Hunter, MD  Cod Liver Oil OIL Take 15 mLs by mouth daily.    Historical Provider, MD  colchicine 0.6 MG tablet Take 0.5 tablets (0.3 mg total) by mouth daily. 09/06/13   Ripudeep Jenna LuoK Rai, MD  furosemide (LASIX) 40 MG tablet Take 2 tablets (80 mg total) by mouth daily. 12/08/14   Lewayne BuntingBrian S Crenshaw, MD  glyBURIDE (DIABETA) 5 MG tablet Take 5 mg by mouth daily with breakfast. Take a 1/2 pill before breakfast and a whole pill before 2nd meal of the day.    Historical Provider, MD  hydrALAZINE (APRESOLINE) 50 MG tablet Take 1 tablet (50 mg total) by mouth 3 (three) times daily. 09/18/14   Shelva MajesticStephen O Hunter, MD   HYDROcodone-acetaminophen (NORCO/VICODIN) 5-325 MG per tablet Take 1 tablet by mouth every 6 (six) hours as needed for moderate pain. 09/18/14   Shelva MajesticStephen O Hunter, MD  isosorbide mononitrate (IMDUR) 60 MG 24 hr tablet TAKE 1 TABLET BY MOUTH DAILY. 03/03/14   Stacie GlazeJohn E Jenkins, MD  levothyroxine (SYNTHROID) 200 MCG tablet Take 1 tablet (200 mcg total) by mouth daily. 09/16/14   Shelva MajesticStephen O Hunter, MD  nitroGLYCERIN (NITROSTAT) 0.4 MG SL tablet Place 1 tablet (0.4 mg total) under the tongue every 5 (five) minutes as needed for chest pain. 11/25/14 04/25/17  [name], PA  olopatadine (PATANOL) 0.1 % ophthalmic solution Place 1 drop into both eyes 2 (two) times daily. 03/31/14   Stacie GlazeJohn E Jenkins, MD  OVER THE COUNTER MEDICATION Take 1 Bottle by mouth as needed (constipation).    Historical Provider, MD  rosuvastatin (CRESTOR) 20 MG tablet Take 1 tablet (20 mg total) by mouth daily. 04/13/14   Toniann KetMatthew K Tucker, PA-C   BP 151/70 mmHg  Pulse 71  Temp(Src) 97.9 F (36.6 C) (Oral)  Resp 22  Ht 5\' 9"  (1.753 m)  Wt 170 lb (77.111 kg)  BMI 25.09 kg/m2  SpO2 99% Physical Exam  Constitutional: He is oriented to person, place, and time. He appears well-developed and well-nourished. No distress.  HENT:  Head: Normocephalic and atraumatic.  Mouth/Throat: Oropharynx is clear and moist.  Neck: Normal range of motion. Neck supple.  Cardiovascular: Normal rate, regular rhythm and normal heart sounds.   No murmur heard. Pulmonary/Chest: Effort normal and breath sounds normal. No respiratory distress. He has no wheezes.  Abdominal: Soft. Bowel sounds are normal. He exhibits no distension. There is no tenderness.  Musculoskeletal: Normal range of motion. He exhibits no edema.  Neurological: He is alert and oriented to person, place, and time.  Skin: Skin is warm and dry. He is not diaphoretic.  Nursing note and vitals reviewed.   ED Course  Procedures (including critical care time) Labs Review Labs Reviewed   COMPREHENSIVE METABOLIC PANEL  CBC WITH DIFFERENTIAL/PLATELET  BRAIN NATRIURETIC PEPTIDE  I-STAT TROPOININ, ED    Imaging Review No results found.   EKG Interpretation   Date/Time:  Thursday December 10 2014 05:36:19 EST Ventricular Rate:  63 PR Interval:  246 QRS Duration: 104 QT Interval:  444 QTC Calculation: 454 R Axis:   65 Text Interpretation:  Sinus rhythm with 1st degree A-V block with  Premature atrial complexes Cannot rule out Anterior infarct , age  undetermined Abnormal ECG Confirmed by DELOS  MD, Breea Loncar ([id number]) on  12/10/2014 5:42:47 AM      MDM  Final diagnoses:  None    Patient is a 79 year old male with history of congestive heart failure, chronic renal insufficiency. He presents today with complaints of dyspnea on exertion and orthopnea that has worsened over the past week. His EKG does not reveal any significant changes and his troponin is negative. His BNP is mildly elevated, however chest x-ray is suggestive of pulmonary edema. His oxygen saturations have been in the upper 80s/lower 90s on room air. He was given a dose of IV Lasix in the ER and I feel will require admission. I've consulted cardiology who will evaluate the patient and determine the final disposition.    Geoffery Lyons, MD 12/10/14 [phone]

## 2014-12-10 NOTE — ED Notes (Signed)
Patient transported to X-ray 

## 2014-12-10 NOTE — ED Notes (Signed)
Patient is able to speak incomplete sentences.  

## 2014-12-15 ENCOUNTER — Ambulatory Visit (HOSPITAL_COMMUNITY)
Admission: RE | Admit: 2014-12-15 | Discharge: 2014-12-15 | Disposition: A | Discharge status: Home / Self Care | Payer: Medicare Other | Source: Ambulatory Visit | Attending: Cardiology | Admitting: Cardiology

## 2014-12-15 ENCOUNTER — Encounter (HOSPITAL_COMMUNITY): Payer: Self-pay

## 2014-12-15 VITALS — BP 128/70 | HR 82 | Wt 185.0 lb

## 2014-12-15 DIAGNOSIS — I5022 Chronic systolic (congestive) heart failure: Secondary | ICD-10-CM | POA: Diagnosis present

## 2014-12-15 DIAGNOSIS — E785 Hyperlipidemia, unspecified: Secondary | ICD-10-CM | POA: Diagnosis not present

## 2014-12-15 DIAGNOSIS — Z7982 Long term (current) use of aspirin: Secondary | ICD-10-CM | POA: Diagnosis not present

## 2014-12-15 DIAGNOSIS — N184 Chronic kidney disease, stage 4 (severe): Secondary | ICD-10-CM | POA: Diagnosis not present

## 2014-12-15 DIAGNOSIS — M109 Gout, unspecified: Secondary | ICD-10-CM | POA: Insufficient documentation

## 2014-12-15 DIAGNOSIS — I255 Ischemic cardiomyopathy: Secondary | ICD-10-CM | POA: Diagnosis not present

## 2014-12-15 DIAGNOSIS — E039 Hypothyroidism, unspecified: Secondary | ICD-10-CM | POA: Diagnosis not present

## 2014-12-15 DIAGNOSIS — Z79899 Other long term (current) drug therapy: Secondary | ICD-10-CM | POA: Insufficient documentation

## 2014-12-15 DIAGNOSIS — I2510 Atherosclerotic heart disease of native coronary artery without angina pectoris: Secondary | ICD-10-CM | POA: Diagnosis not present

## 2014-12-15 DIAGNOSIS — I129 Hypertensive chronic kidney disease with stage 1 through stage 4 chronic kidney disease, or unspecified chronic kidney disease: Secondary | ICD-10-CM | POA: Diagnosis not present

## 2014-12-15 DIAGNOSIS — R42 Dizziness and giddiness: Secondary | ICD-10-CM

## 2014-12-15 DIAGNOSIS — E119 Type 2 diabetes mellitus without complications: Secondary | ICD-10-CM | POA: Insufficient documentation

## 2014-12-15 LAB — BASIC METABOLIC PANEL WITH GFR
BUN: 54 mg/dL — ABNORMAL HIGH (ref 6–23)
CHLORIDE: 110 meq/L (ref 96–112)
CO2: 20 meq/L (ref 19–32)
CREATININE: 3.42 mg/dL — AB (ref 0.50–1.35)
Calcium: 9.2 mg/dL (ref 8.4–10.5)
GFR, Est African American: 19 mL/min — ABNORMAL LOW
GFR, Est Non African American: 16 mL/min — ABNORMAL LOW
Glucose, Bld: 124 mg/dL — ABNORMAL HIGH (ref 70–99)
POTASSIUM: 4.6 meq/L (ref 3.5–5.3)
Sodium: 143 mEq/L (ref 135–145)

## 2014-12-15 MED ORDER — HYDRALAZINE HCL 50 MG PO TABS: 75.0000 mg | ORAL_TABLET | Freq: Three times a day (TID) | ORAL | Status: DC

## 2014-12-15 MED ORDER — AMLODIPINE BESYLATE 5 MG PO TABS: 5.0000 mg | ORAL_TABLET | Freq: Every day | ORAL | Status: DC

## 2014-12-15 NOTE — Progress Notes (Signed)
Patient ID: [name] [name], male   DOB: 1936-09-04, 79 y.o.   MRN: [id number] PCP: Dr. Durene Cal  79 yo with history of CAD, ischemic cardiomyopathy, and CKD presents for cardiology followup.  Patient had PCI to mid and distal RCA in 2005.  In 2/16, he was admitted from the ER with chest pain and acute/chronic systolic CHF.  Troponin remained negative.  He was diuresed.  Echo showed EF 40-45%.  Cardiolite showed EF 39% with inferior infarct and inferolateral infarct with peri-infarct ischemia.  It was decided to manage medically.    Patient went back to the ER after discharge on 12/10/14.  He was volume overloaded, Lasix was increased.    Since ER visit, he is doing better.  He is breathing well.  No dyspnea walking on flat ground.  No orthopnea or PND.  No further chest pain.  No lightheadedness or syncope.  No palpitations.   Labs (2/16): K 4.6, creatinine 3.42, BNP 327  ECG: NSR, nonspecific T wave flattening (narrow QRS)  PMH: 1. CAD: h/o PCI to mid and distal RCA in 2005.  Lexiscan Cardiolite in 2/16 with EF 39%, inferior infarction, inferolateral infarction with peri-infarct ischemia.   2. HTN 3. Hyperlipidemia 4. Type II diabetes 5. CKD stage IV 6. Hypothyroidism 7. Gout 8. Chronic systolic CHF: Ischemic cardiomyopathy.  Echo (2/16) with EF 40-45%, inferior/inferoseptal severe hypokinesis, grade II diastolic dysfunction, moderate MR, PA systolic pressure 56 mmHg.  EF 39% on 2/16 Cardiolite.    SH: Married, lives in [place], prior smoker.   FH: Brother with MI in his 56s.    ROS: All systems reviewed and negative except as noted in HPI.   Current Outpatient Prescriptions  Medication Sig Dispense Refill  . acetaminophen (TYLENOL) 500 MG tablet Take 500 mg by mouth every 6 (six) hours as needed.    Marland Kitchen amLODipine (NORVASC) 5 MG tablet Take 1 tablet (5 mg total) by mouth daily. 60 tablet 12  . aspirin 325 MG tablet Take 325 mg by mouth daily.      . calcitRIOL (ROCALTROL) 0.25 MCG  capsule 0.25 mcg. Take one cap by mouth on odd days and 2 caps on even days.     . carvedilol (COREG) 12.5 MG tablet Take 1 tablet (12.5 mg total) by mouth 2 (two) times daily with a meal. 60 tablet 11  . chlorzoxazone (PARAFON) 500 MG tablet Take 1 tablet (500 mg total) by mouth 3 (three) times daily as needed for muscle spasms. 60 tablet 1  . Cod Liver Oil OIL Take 15 mLs by mouth daily.    . colchicine 0.6 MG tablet Take 0.5 tablets (0.3 mg total) by mouth daily.    . furosemide (LASIX) 40 MG tablet Take 2 tablets (80 mg total) by mouth daily. 60 tablet 6  . glyBURIDE (DIABETA) 5 MG tablet Take 5 mg by mouth daily with breakfast. Take a 1/2 pill before breakfast and a whole pill before 2nd meal of the day.    . hydrALAZINE (APRESOLINE) 50 MG tablet Take 1.5 tablets (75 mg total) by mouth 3 (three) times daily. 90 tablet 3  . HYDROcodone-acetaminophen (NORCO/VICODIN) 5-325 MG per tablet Take 1 tablet by mouth every 6 (six) hours as needed for moderate pain. 60 tablet 0  . isosorbide mononitrate (IMDUR) 60 MG 24 hr tablet TAKE 1 TABLET BY MOUTH DAILY. 30 tablet 11  . levothyroxine (SYNTHROID) 200 MCG tablet Take 1 tablet (200 mcg total) by mouth daily. 90 tablet 1  .  nitroGLYCERIN (NITROSTAT) 0.4 MG SL tablet Place 1 tablet (0.4 mg total) under the tongue every 5 (five) minutes as needed for chest pain. 25 tablet 3  . olopatadine (PATANOL) 0.1 % ophthalmic solution Place 1 drop into both eyes 2 (two) times daily. 5 mL 3  . rosuvastatin (CRESTOR) 20 MG tablet Take 1 tablet (20 mg total) by mouth daily. 90 tablet 3   No current facility-administered medications for this encounter.   BP 128/70 mmHg  Pulse 82  Wt 185 lb (83.915 kg)  SpO2 93% General: NAD Neck: No JVD, no thyromegaly or thyroid nodule.  Lungs: Clear to auscultation bilaterally with normal respiratory effort. CV: Nondisplaced PMI.  Heart regular S1/S2, no S3/S4, no murmur.  No peripheral edema.  No carotid bruit.  Normal pedal  pulses.  Abdomen: Soft, nontender, no hepatosplenomegaly, no distention.  Skin: Intact without lesions or rashes.  Neurologic: Alert and oriented x 3.  Psych: Normal affect. Extremities: No clubbing or cyanosis.  HEENT: Normal.   Assessment/Plan: 1. Chronic systolic CHF: Ischemic cardiomyopathy.  EF 40-45%.  Euvolemic on exam on higher dose of Lasix. NYHA class II symptoms.  - Continue Lasix 80 mg daily.  If weight at home goes to 174 lbs or above, he will take Lasix 80 qam/40 qpm for 3 days in a row.  - Continue Coreg 12.5 mg bid.  - Will increase hydralazine to 75 mg tid and continue Imdur 60 mg daily.  Since I am increasing hydralazine, I will decrease amlodipine to 5 mg once daily.  - No ACEI or spironolactone with CKD IV.  - Followup in 2 wks with [facility].  2. CKD: Stage IV.  Followed by Dr Briant CedarMattingly.  3. CAD: No chest pain since last admission.  Continue ASA, Coreg, Crestor, Imdur.   Marca AnconaDalton Maribella Kuna 12/15/2014

## 2014-12-15 NOTE — Patient Instructions (Addendum)
Increase Hydralazine to 75 mg (1 & 1/2 tabs) Three times a day   Decrease Amlodipine 5 mg daily  Your physician recommends that you schedule a follow-up appointment in: 2 weeks with labs (bmet)

## 2014-12-28 NOTE — Progress Notes (Signed)
HPI: FU coronary artery disease. His most recent catheterization was in March 2005 and showed a 40% left main, no significant disease in the circumflex, total occlusion of the ramus branch with a 50% first marginal, 40% proximal, followed by 95% mid, and 90% mid distal stenosis in the right coronary artery. The patient had successful PCI of the right coronary artery both the mid and mid-to- distal right lesions. Admitted 2/16 with CP and CHF; diuresed; echo EF 40-45; moderate MR, mild to moderate TR; moderately elevated pulmonary pressure; nuclear study showed EF 39; inferior/inferolateral infarct with mild peri-infarct ischemia. Treated medically. Since last seen, the patient denies any dyspnea on exertion, orthopnea, PND, pedal edema, palpitations, syncope or chest pain.  Current Outpatient Prescriptions  Medication Sig Dispense Refill  . acetaminophen (TYLENOL) 500 MG tablet Take 500 mg by mouth every 6 (six) hours as needed.    Marland Kitchen. amLODipine (NORVASC) 5 MG tablet Take 1 tablet (5 mg total) by mouth daily. 60 tablet 12  . aspirin 325 MG tablet Take 325 mg by mouth daily.      . calcitRIOL (ROCALTROL) 0.25 MCG capsule 0.25 mcg. Take one cap by mouth on odd days and 2 caps on even days.     . carvedilol (COREG) 12.5 MG tablet Take 1 tablet (12.5 mg total) by mouth 2 (two) times daily with a meal. 60 tablet 11  . chlorzoxazone (PARAFON) 500 MG tablet Take 1 tablet (500 mg total) by mouth 3 (three) times daily as needed for muscle spasms. 60 tablet 1  . Cod Liver Oil OIL Take 15 mLs by mouth daily.    . colchicine 0.6 MG tablet Take 0.5 tablets (0.3 mg total) by mouth daily.    . furosemide (LASIX) 40 MG tablet Take 2 tablets (80 mg total) by mouth daily. 60 tablet 6  . glyBURIDE (DIABETA) 5 MG tablet Take 5 mg by mouth daily with breakfast. Take a 1/2 pill before breakfast and a whole pill before 2nd meal of the day.    . hydrALAZINE (APRESOLINE) 50 MG tablet Take 1.5 tablets (75 mg total) by  mouth 3 (three) times daily. 90 tablet 3  . HYDROcodone-acetaminophen (NORCO/VICODIN) 5-325 MG per tablet Take 1 tablet by mouth every 6 (six) hours as needed for moderate pain. 60 tablet 0  . isosorbide mononitrate (IMDUR) 60 MG 24 hr tablet TAKE 1 TABLET BY MOUTH DAILY. 30 tablet 11  . levothyroxine (SYNTHROID) 200 MCG tablet Take 1 tablet (200 mcg total) by mouth daily. 90 tablet 3  . nitroGLYCERIN (NITROSTAT) 0.4 MG SL tablet Place 1 tablet (0.4 mg total) under the tongue every 5 (five) minutes as needed for chest pain. 25 tablet 3  . olopatadine (PATANOL) 0.1 % ophthalmic solution Place 1 drop into both eyes 2 (two) times daily. 5 mL 3  . rosuvastatin (CRESTOR) 20 MG tablet Take 1 tablet (20 mg total) by mouth daily. 90 tablet 3   No current facility-administered medications for this visit.     Past Medical History  Diagnosis Date  . CAD (coronary artery disease)     a. s/p PCI to mid and mid-distal RCA, 40% sten L main, no sig dz LCx  b. myoview 11/24/2014 inferoapex ischemia, medical management due to high risk for contrast nephropathy  . HTN (hypertension)   . Hyperlipidemia   . Cerebrovascular accident   . CHF (congestive heart failure)     EF 35% echo 2012 ; EF 44% myoview 2012; EF  76% by [facility] November 2014  . Diabetes mellitus   . 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 Unspecified hypothyroidism   . Gout     Past Surgical History  Procedure Laterality Date  . Thyroidectomy      History   Social History  . Marital Status: Married    Spouse Name: N/A  . Number of Children: N/A  . Years of Education: N/A   Occupational History  . Not on file.   Social History Main Topics  . Smoking status: Former Smoker -- 0.50 packs/day    Types: Cigarettes    Quit date: 10/16/1953  . Smokeless tobacco: Not on file     Comment: quit in the 70's  . Alcohol Use: No  . Drug Use: No  . Sexual Activity: Not Currently   Other Topics Concern  . Not on file   Social  History Narrative   Lives in [place] with his wife and has 6 kids.     ROS: no fevers or chills, productive cough, hemoptysis, dysphasia, odynophagia, melena, hematochezia, dysuria, hematuria, rash, seizure activity, orthopnea, PND, pedal edema, claudication. Remaining systems are negative.  Physical Exam: Well-developed well-nourished in no acute distress.  Skin is warm and dry.  HEENT is normal.  Neck is supple.  Chest is clear to auscultation with normal expansion.  Cardiovascular exam is regular rate and rhythm.  Abdominal exam nontender or distended. No masses palpated. Extremities show no edema. neuro grossly intact

## 2014-12-29 ENCOUNTER — Encounter: Payer: Self-pay | Admitting: Cardiology

## 2014-12-29 ENCOUNTER — Ambulatory Visit (INDEPENDENT_AMBULATORY_CARE_PROVIDER_SITE_OTHER): Payer: Medicare Other | Admitting: Cardiology

## 2014-12-29 VITALS — BP 106/64 | HR 68 | Ht 69.0 in | Wt 182.0 lb

## 2014-12-29 DIAGNOSIS — N184 Chronic kidney disease, stage 4 (severe): Secondary | ICD-10-CM

## 2014-12-29 DIAGNOSIS — I5042 Chronic combined systolic (congestive) and diastolic (congestive) heart failure: Secondary | ICD-10-CM | POA: Diagnosis not present

## 2014-12-29 DIAGNOSIS — I255 Ischemic cardiomyopathy: Secondary | ICD-10-CM

## 2014-12-29 DIAGNOSIS — I2510 Atherosclerotic heart disease of native coronary artery without angina pectoris: Secondary | ICD-10-CM | POA: Diagnosis not present

## 2014-12-29 DIAGNOSIS — I10 Essential (primary) hypertension: Secondary | ICD-10-CM

## 2014-12-29 DIAGNOSIS — E785 Hyperlipidemia, unspecified: Secondary | ICD-10-CM

## 2014-12-29 MED ORDER — LEVOTHYROXINE SODIUM 200 MCG PO TABS: 200.0000 ug | ORAL_TABLET | Freq: Every day | ORAL | Status: DC

## 2014-12-29 NOTE — Assessment & Plan Note (Signed)
Patient is scheduled to see nephrology tomorrow. He is unclear if he would agree to dialysis.

## 2014-12-29 NOTE — Patient Instructions (Signed)
Your physician recommends that you schedule a follow-up appointment in: 3 MONTHS WITH DR CRENSHAW  

## 2014-12-29 NOTE — Assessment & Plan Note (Signed)
Patient is euvolemic on examination. Continue present dose of Lasix. Patient instructed on low sodium diet and fluid restriction. He weighs himself daily and I have asked him to take an additional 40 mg Lasix for weight gain of 2 pounds.

## 2014-12-29 NOTE — Assessment & Plan Note (Signed)
Continue statin. 

## 2014-12-29 NOTE — Assessment & Plan Note (Signed)
Blood pressure controlled. Continue present medications. 

## 2014-12-29 NOTE — Assessment & Plan Note (Signed)
Continue beta blocker. Continue hydralazine/nitrates. No ACE inhibitor given baseline renal insufficiency.

## 2014-12-29 NOTE — Assessment & Plan Note (Signed)
Continue aspirin and statin. 

## 2014-12-30 LAB — BASIC METABOLIC PANEL
BUN: 84 mg/dL — AB (ref 4–21)
Creatinine: 3.8 mg/dL — AB (ref <=1.3)
GLUCOSE: 282 mg/dL
Potassium: 5 mmol/L (ref 3.4–5.3)
SODIUM: 145 mmol/L (ref 137–147)

## 2015-01-01 ENCOUNTER — Encounter (HOSPITAL_COMMUNITY): Payer: PRIVATE HEALTH INSURANCE

## 2015-01-04 ENCOUNTER — Encounter: Payer: Self-pay | Admitting: Family Medicine

## 2015-01-04 LAB — PTH, INTACT: PTH Interp: 78

## 2015-01-11 ENCOUNTER — Ambulatory Visit (HOSPITAL_COMMUNITY)
Admission: RE | Admit: 2015-01-11 | Discharge: 2015-01-11 | Disposition: A | Discharge status: Home / Self Care | Payer: Medicare Other | Source: Ambulatory Visit | Attending: Internal Medicine | Admitting: Internal Medicine

## 2015-01-11 VITALS — BP 126/58 | HR 63 | Wt 181.8 lb

## 2015-01-11 DIAGNOSIS — N184 Chronic kidney disease, stage 4 (severe): Secondary | ICD-10-CM | POA: Diagnosis not present

## 2015-01-11 DIAGNOSIS — I2510 Atherosclerotic heart disease of native coronary artery without angina pectoris: Secondary | ICD-10-CM | POA: Insufficient documentation

## 2015-01-11 DIAGNOSIS — E119 Type 2 diabetes mellitus without complications: Secondary | ICD-10-CM | POA: Insufficient documentation

## 2015-01-11 DIAGNOSIS — R42 Dizziness and giddiness: Secondary | ICD-10-CM | POA: Diagnosis not present

## 2015-01-11 DIAGNOSIS — Z79899 Other long term (current) drug therapy: Secondary | ICD-10-CM | POA: Insufficient documentation

## 2015-01-11 DIAGNOSIS — E785 Hyperlipidemia, unspecified: Secondary | ICD-10-CM | POA: Diagnosis not present

## 2015-01-11 DIAGNOSIS — I5022 Chronic systolic (congestive) heart failure: Secondary | ICD-10-CM | POA: Diagnosis not present

## 2015-01-11 DIAGNOSIS — I255 Ischemic cardiomyopathy: Secondary | ICD-10-CM | POA: Diagnosis not present

## 2015-01-11 DIAGNOSIS — I129 Hypertensive chronic kidney disease with stage 1 through stage 4 chronic kidney disease, or unspecified chronic kidney disease: Secondary | ICD-10-CM | POA: Diagnosis not present

## 2015-01-11 DIAGNOSIS — Z7982 Long term (current) use of aspirin: Secondary | ICD-10-CM | POA: Insufficient documentation

## 2015-01-11 DIAGNOSIS — M109 Gout, unspecified: Secondary | ICD-10-CM | POA: Diagnosis not present

## 2015-01-11 DIAGNOSIS — E039 Hypothyroidism, unspecified: Secondary | ICD-10-CM | POA: Diagnosis not present

## 2015-01-11 MED ORDER — AMLODIPINE BESYLATE 5 MG PO TABS: 5.0000 mg | ORAL_TABLET | Freq: Every day | ORAL | Status: DC

## 2015-01-11 MED ORDER — CARVEDILOL 12.5 MG PO TABS
12.5000 mg | ORAL_TABLET | Freq: Two times a day (BID) | ORAL | Status: DC
Start: 2015-01-11 — End: 2015-11-11

## 2015-01-11 NOTE — Patient Instructions (Signed)
Carvedilol 12.5mg  , one pill twice a day Amlodipine 5 mg, one pill daily  Your physician recommends that you schedule a follow-up appointment in: 3-4 months  Do the following things EVERYDAY: 1) Weigh yourself in the morning before breakfast. Write it down and keep it in a log. 2) Take your medicines as prescribed 3) Eat low salt foods-Limit salt (sodium) to 2000 mg per day.  4) Stay as active as you can everyday 5) Limit all fluids for the day to less than 2 liters. 6)

## 2015-01-11 NOTE — Progress Notes (Signed)
Patient ID: [name] [name], male   DOB: 08-06-1936, 79 y.o.   MRN: [id number] PCP: Dr. Durene Cal  79 yo with history of CAD, ischemic cardiomyopathy, and CKD presents for cardiology followup.  Patient had PCI to mid and distal RCA in 2005.  In 2/16, he was admitted from the ER with chest pain and acute/chronic systolic CHF.  Troponin remained negative.  He was diuresed.  Echo showed EF 40-45%.  Cardiolite showed EF 39% with inferior infarct and inferolateral infarct with peri-infarct ischemia.  It was decided to manage medically.    Patient went back to the ER after discharge on 12/10/14.  He was volume overloaded, Lasix was increased.    He returns for follow up. Last visit he was instructed to cut back amlodipine to 5 mg daily and increase hydralazine to 75 mg three times a day. He was did increase hydralazine but continue on amlodipine 10 mg daily. He also cut back carvedilol to 12.5 mg daily. Weight at home 170 pounds. Denies SOB/PND/Orthopnea.  No further chest pain.  No lightheadedness or syncope.  No palpitations. Limits fluid intake to <2 liters per day.   Labs (2/16): K 4.6, creatinine 3.42, BNP 327 Labs (12/30/2014): K 5.0 Creatine 3.8   PMH: 1. CAD: h/o PCI to mid and distal RCA in 2005.  Lexiscan Cardiolite in 2/16 with EF 39%, inferior infarction, inferolateral infarction with peri-infarct ischemia.   2. HTN 3. Hyperlipidemia 4. Type II diabetes 5. CKD stage IV 6. Hypothyroidism 7. Gout 8. Chronic systolic CHF: Ischemic cardiomyopathy.  Echo (2/16) with EF 40-45%, inferior/inferoseptal severe hypokinesis, grade II diastolic dysfunction, moderate MR, PA systolic pressure 56 mmHg.  EF 39% on 2/16 Cardiolite.    SH: Married, lives in [place], prior smoker.   FH: Brother with MI in his 51s.    ROS: All systems reviewed and negative except as noted in HPI.   Current Outpatient Prescriptions  Medication Sig Dispense Refill  . acetaminophen (TYLENOL) 500 MG tablet Take 500 mg by  mouth every 6 (six) hours as needed.    Marland Kitchen amLODipine (NORVASC) 5 MG tablet Take 1 tablet (5 mg total) by mouth daily. (Patient taking differently: Take 10 mg by mouth daily. ) 60 tablet 12  . aspirin 325 MG tablet Take 325 mg by mouth daily.      . calcitRIOL (ROCALTROL) 0.25 MCG capsule 0.25 mcg. Take one cap by mouth on odd days and 2 caps on even days.     . carvedilol (COREG) 12.5 MG tablet Take 1 tablet (12.5 mg total) by mouth 2 (two) times daily with a meal. (Patient taking differently: Take 12.5 mg by mouth daily. ) 60 tablet 11  . chlorzoxazone (PARAFON) 500 MG tablet Take 1 tablet (500 mg total) by mouth 3 (three) times daily as needed for muscle spasms. 60 tablet 1  . Cod Liver Oil OIL Take 15 mLs by mouth daily.    . colchicine 0.6 MG tablet Take 0.5 tablets (0.3 mg total) by mouth daily.    . furosemide (LASIX) 40 MG tablet Take 2 tablets (80 mg total) by mouth daily. 60 tablet 6  . glyBURIDE (DIABETA) 5 MG tablet Take 5 mg by mouth daily with breakfast. Take a 1/2 pill before breakfast and a whole pill before 2nd meal of the day.    . hydrALAZINE (APRESOLINE) 50 MG tablet Take 1.5 tablets (75 mg total) by mouth 3 (three) times daily. 90 tablet 3  . isosorbide mononitrate (IMDUR) 60 MG  24 hr tablet TAKE 1 TABLET BY MOUTH DAILY. 30 tablet 11  . levothyroxine (SYNTHROID) 200 MCG tablet Take 1 tablet (200 mcg total) by mouth daily. 90 tablet 3  . nitroGLYCERIN (NITROSTAT) 0.4 MG SL tablet Place 1 tablet (0.4 mg total) under the tongue every 5 (five) minutes as needed for chest pain. 25 tablet 3  . olopatadine (PATANOL) 0.1 % ophthalmic solution Place 1 drop into both eyes 2 (two) times daily. 5 mL 3  . rosuvastatin (CRESTOR) 20 MG tablet Take 1 tablet (20 mg total) by mouth daily. 90 tablet 3  . HYDROcodone-acetaminophen (NORCO/VICODIN) 5-325 MG per tablet Take 1 tablet by mouth every 6 (six) hours as needed for moderate pain. 60 tablet 0   No current facility-administered medications for  this encounter.   BP 126/58 mmHg  Pulse 63  Wt 181 lb 12.8 oz (82.464 kg)  SpO2 97% General: NAD Neck: No JVD, no thyromegaly or thyroid nodule.  Lungs: Clear to auscultation bilaterally with normal respiratory effort. CV: Nondisplaced PMI.  Heart regular S1/S2, no S3/S4, no murmur.  No peripheral edema.  No carotid bruit.  Normal pedal pulses.  Abdomen: Soft, nontender, no hepatosplenomegaly, no distention.  Skin: Intact without lesions or rashes.  Neurologic: Alert and oriented x 3.  Psych: Normal affect. Extremities: No clubbing or cyanosis.  HEENT: Normal.   Assessment/Plan: 1. Chronic systolic CHF: Ischemic cardiomyopathy.  EF 40-45%.  NYHA II Volume status stable. Continue Lasix 80 mg daily.  If weight at home goes to 174 lbs or above, he will take Lasix 80 qam/40 qpm for 3 days in a row.   - Today I went over his medications and reviewed the bottles. I will increase  Coreg to 12.5 mg twice a day and cut back amlodipine to 5 mg daily.  - Continue  hydralazine to 75 mg tid and continue Imdur 60 mg daily.   - No ACEI or spironolactone with CKD IV.  2. CKD: Stage IV.  Followed by Dr Briant CedarMattingly. Does not want HD.  3. CAD: No evidence of ischemia. No chest pain.  Continue ASA, Coreg, Crestor, Imdur.   Follow up in 3-4 months   Ramello Cordial NP-C  01/11/2015

## 2015-01-19 ENCOUNTER — Ambulatory Visit (INDEPENDENT_AMBULATORY_CARE_PROVIDER_SITE_OTHER): Payer: Medicare Other | Admitting: Family Medicine

## 2015-01-19 ENCOUNTER — Encounter: Payer: Self-pay | Admitting: Family Medicine

## 2015-01-19 VITALS — BP 158/60 | HR 79 | Temp 97.7°F | Wt 182.0 lb

## 2015-01-19 DIAGNOSIS — E890 Postprocedural hypothyroidism: Secondary | ICD-10-CM | POA: Diagnosis not present

## 2015-01-19 DIAGNOSIS — Z23 Encounter for immunization: Secondary | ICD-10-CM | POA: Diagnosis not present

## 2015-01-19 DIAGNOSIS — E785 Hyperlipidemia, unspecified: Secondary | ICD-10-CM

## 2015-01-19 DIAGNOSIS — I10 Essential (primary) hypertension: Secondary | ICD-10-CM | POA: Diagnosis not present

## 2015-01-19 DIAGNOSIS — E1129 Type 2 diabetes mellitus with other diabetic kidney complication: Secondary | ICD-10-CM

## 2015-01-19 DIAGNOSIS — IMO0002 Reserved for concepts with insufficient information to code with codable children: Secondary | ICD-10-CM

## 2015-01-19 DIAGNOSIS — E1165 Type 2 diabetes mellitus with hyperglycemia: Secondary | ICD-10-CM

## 2015-01-19 LAB — TSH: TSH: 0.13 u[IU]/mL — ABNORMAL LOW (ref 0.35–4.50)

## 2015-01-19 LAB — LIPID PANEL
CHOL/HDL RATIO: 3
Cholesterol: 123 mg/dL (ref 0–200)
HDL: 47.4 mg/dL (ref >39.00)
LDL CALC: 60 mg/dL (ref 0–99)
NONHDL: 75.6
Triglycerides: 80 mg/dL (ref 0.0–149.0)
VLDL: 16 mg/dL (ref 0.0–40.0)

## 2015-01-19 LAB — HEMOGLOBIN A1C: HEMOGLOBIN A1C: 7.1 % — AB (ref 4.6–6.5)

## 2015-01-19 MED ORDER — HYDROCODONE-ACETAMINOPHEN 5-325 MG PO TABS: 1.0000 | ORAL_TABLET | Freq: Four times a day (QID) | ORAL | Status: DC | PRN

## 2015-01-19 NOTE — Progress Notes (Signed)
[name], MD Phone: [phone]  Subjective:   [name] [name] is a 79 y.o. year old very pleasant male patient who presents with the following:  Hypertension-poor contorl today s did not take meds  BP Readings from Last 3 Encounters:  01/19/15 158/60  01/11/15 126/58  12/29/14 106/64    170 lbs consistent at home-not taking extra lasix   Home BP monitoring-home readings in low 100s Compliant with medications-yes without side effects except today ROS-Denies any CP, HA, SOB, blurry vision, LE edema.  At least none of these since recent heart attack.    DIABETES Type II-reasonable control with a1c goal <8 with age and comorbidities  Lab Results  Component Value Date   HGBA1C 7.7* 09/18/2014   HGBA1C 7.1* 06/19/2014   HGBA1C 6.4 02/16/2014  Medications taking and tolerating-yes, glyburid , 2.5mg .  Blood Sugars per patient-fasting-90s in the morning Regular Exercise-has started walking again On Aspirin-yes On statin-crestor Daily foot monitoring-yes  ROS- Denies Polyuria increase ,Polydipsia, nocturia increase, Vision changes, feet or hand numbness/pain/tingling. Denies  Hypoglycemia symptoms (shaky, sweaty, hungry, weak anxious, tremor, palpitations, confusion, behavior change).   Hyperlipidemia-well controlled  Lab Results  Component Value Date   LDLCALC 51 09/06/2013   On statin: crestor  Regular exercise: no, limited by pain and cardiac disease ROS- no chest pain or shortness of breath. No myalgias  Past Medical History- Patient Active Problem List   Diagnosis Date Noted  . Ischemic cardiomyopathy 04/13/2011    Priority: High  . Congestive heart failure 09/07/2008    Priority: High  . Type II diabetes mellitus with renal manifestations 10/31/2007    Priority: High  . Coronary atherosclerosis 09/25/2007    Priority: High  . BACK PAIN 03/20/2008    Priority: Medium  . Chronic renal disease, stage IV 10/31/2007    Priority: Medium  .  Hypothyroidism 04/09/2007    Priority: Medium  . Hyperlipidemia 04/09/2007    Priority: Medium  . GOUT 04/09/2007    Priority: Medium  . Essential hypertension 04/09/2007    Priority: Medium  . CEREBROVASCULAR ACCIDENT, HX OF 04/09/2007    Priority: Medium  . Osteoarthritis severe bilateral knees 04/06/2010    Priority: Low  . LEG CRAMPS, IDIOPATHIC 04/06/2010    Priority: Low  . Chest pain 11/23/2014   Medications- reviewed and updated Current Outpatient Prescriptions  Medication Sig Dispense Refill  . amLODipine (NORVASC) 5 MG tablet Take 1 tablet (5 mg total) by mouth daily. 60 tablet 12  . aspirin 325 MG tablet Take 325 mg by mouth daily.      . calcitRIOL (ROCALTROL) 0.25 MCG capsule 0.25 mcg. Take one cap by mouth on odd days and 2 caps on even days.     . carvedilol (COREG) 12.5 MG tablet Take 1 tablet (12.5 mg total) by mouth 2 (two) times daily with a meal. 60 tablet 11  . Cod Liver Oil OIL Take 15 mLs by mouth daily.    . colchicine 0.6 MG tablet Take 0.5 tablets (0.3 mg total) by mouth daily.    . furosemide (LASIX) 40 MG tablet Take 2 tablets (80 mg total) by mouth daily. 60 tablet 6  . glyBURIDE (DIABETA) 5 MG tablet Take 5 mg by mouth daily with breakfast. Take a 1/2 pill before breakfast and a whole pill before 2nd meal of the day.    . hydrALAZINE (APRESOLINE) 50 MG tablet Take 1.5 tablets (75 mg total) by mouth 3 (three) times daily. 90 tablet 3  .  isosorbide mononitrate (IMDUR) 60 MG 24 hr tablet TAKE 1 TABLET BY MOUTH DAILY. 30 tablet 11  . levothyroxine (SYNTHROID) 200 MCG tablet Take 1 tablet (200 mcg total) by mouth daily. 90 tablet 3  . olopatadine (PATANOL) 0.1 % ophthalmic solution Place 1 drop into both eyes 2 (two) times daily. 5 mL 3  . rosuvastatin (CRESTOR) 20 MG tablet Take 1 tablet (20 mg total) by mouth daily. 90 tablet 3  . acetaminophen (TYLENOL) 500 MG tablet Take 500 mg by mouth every 6 (six) hours as needed.    . chlorzoxazone (PARAFON) 500 MG  tablet Take 1 tablet (500 mg total) by mouth 3 (three) times daily as needed for muscle spasms. (Patient not taking: Reported on 01/19/2015) 60 tablet 1  . HYDROcodone-acetaminophen (NORCO/VICODIN) 5-325 MG per tablet Take 1 tablet by mouth every 6 (six) hours as needed for moderate pain. 60 tablet 0  . nitroGLYCERIN (NITROSTAT) 0.4 MG SL tablet Place 1 tablet (0.4 mg total) under the tongue every 5 (five) minutes as needed for chest pain. (Patient not taking: Reported on 01/19/2015) 25 tablet 3   No current facility-administered medications for this visit.    Objective: BP 158/60 mmHg  Pulse 79  Temp(Src) 97.7 F (36.5 C)  Wt 182 lb (82.555 kg) Gen: NAD, resting comfortably CV: RRR no murmurs rubs or gallops Lungs: CTAB no crackles, wheeze, rhonchi Abdomen: soft/nontender/nondistended/normal bowel sounds. No rebound or guarding.  Ext: no edema Skin: warm, dry Neuro: grossly normal, moves all extremities  Assessment/Plan:  Hyperlipidemia Controlled in past. Check LDL today.    Type II diabetes mellitus with renal manifestations Check a1c today. a1c goal <8. Continue Glyburide 2.5mg  AM, 5mg  PM   Essential hypertension Poor control as did not take meds today. Continue Amlodipine 5 mg BID, carvedilol 12.5 mg BID, Hydralazine 100mg  TID, imdur, lasix. Encouraged to take when he gets home today.    3-4 month follow up.    Orders Placed This Encounter  Procedures  . Pneumococcal conjugate vaccine 13-valent  . Hemoglobin A1c  . TSH    [place]  . Lipid panel    [facility]    Order Specific Question:  Has the patient fasted?    Answer:  No   Refilled pain medication for chronic low back pain.  Meds ordered this encounter  Medications  . HYDROcodone-acetaminophen (NORCO/VICODIN) 5-325 MG per tablet    Sig: Take 1 tablet by mouth every 6 (six) hours as needed for moderate pain.    Dispense:  60 tablet    Refill:  0

## 2015-01-19 NOTE — Assessment & Plan Note (Signed)
Controlled in past. Check LDL today.

## 2015-01-19 NOTE — Patient Instructions (Addendum)
Fasting labs today   Make sure to take your blood pressure medicines before your visits in the future. Blood pressure up some today-take it when you get home.   Let's check back in 3-4 months from now

## 2015-01-19 NOTE — Assessment & Plan Note (Signed)
Check a1c today. a1c goal <8. Continue Glyburide 2.5mg  AM, 5mg  PM

## 2015-01-19 NOTE — Assessment & Plan Note (Signed)
Poor control as did not take meds today. Continue Amlodipine 5 mg BID, carvedilol 12.5 mg BID, Hydralazine 100mg  TID, imdur, lasix. Encouraged to take when he gets home today.

## 2015-01-21 NOTE — Addendum Note (Signed)
Addended by: Lieutenant DiegoHINES, Asti Mackley A on: 01/21/2015 11:25 AM   Modules accepted: Orders, Medications

## 2015-02-15 ENCOUNTER — Telehealth: Payer: Self-pay

## 2015-02-15 MED ORDER — ISOSORBIDE MONONITRATE ER 60 MG PO TB24: 60.0000 mg | ORAL_TABLET | Freq: Every day | ORAL | Status: DC

## 2015-02-15 NOTE — Telephone Encounter (Signed)
[facility] refill request for isosorbide mononitrate (IMDUR) 60 MG 24 hr tablet

## 2015-02-15 NOTE — Telephone Encounter (Signed)
Medication refilled

## 2015-03-01 LAB — CBC AND DIFFERENTIAL: Hemoglobin: 12 g/dL — AB (ref 13.5–17.5)

## 2015-03-01 LAB — BASIC METABOLIC PANEL
BUN: 75 mg/dL — AB (ref 4–21)
CREATININE: 3.6 mg/dL — AB (ref <=1.3)
Glucose: 207 mg/dL
Potassium: 4.9 mmol/L (ref 3.4–5.3)
SODIUM: 141 mmol/L (ref 137–147)

## 2015-03-02 ENCOUNTER — Encounter: Payer: Self-pay | Admitting: Family Medicine

## 2015-03-12 ENCOUNTER — Other Ambulatory Visit: Payer: Self-pay | Admitting: Family Medicine

## 2015-03-12 MED ORDER — ROSUVASTATIN CALCIUM 20 MG PO TABS: 20.0000 mg | ORAL_TABLET | Freq: Every day | ORAL | Status: DC

## 2015-03-12 NOTE — Telephone Encounter (Signed)
Sent to the pharmacy by e-scribe. 

## 2015-03-25 NOTE — Progress Notes (Signed)
HPI: FU coronary artery disease. His most recent catheterization was in March 2005 and showed a 40% left main, no significant disease in the circumflex, total occlusion of the ramus branch with a 50% first marginal, 40% proximal, followed by 95% mid, and 90% mid distal stenosis in the right coronary artery. The patient had successful PCI of the right coronary artery both the mid and mid-to- distal right lesions. Admitted 2/16 with CP and CHF; diuresed; echo EF 40-45; moderate MR, mild to moderate TR; moderately elevated pulmonary pressure; nuclear study showed EF 39; inferior/inferolateral infarct with mild peri-infarct ischemia. Treated medically. Since last seen, the patient denies any dyspnea on exertion, orthopnea, PND, pedal edema, palpitations, syncope or chest pain.  Current Outpatient Prescriptions  Medication Sig Dispense Refill  . acetaminophen (TYLENOL) 500 MG tablet Take 500 mg by mouth every 6 (six) hours as needed.    Marland Kitchen amLODipine (NORVASC) 5 MG tablet Take 1 tablet (5 mg total) by mouth daily. 60 tablet 12  . aspirin 325 MG tablet Take 325 mg by mouth daily.      . calcitRIOL (ROCALTROL) 0.25 MCG capsule 0.25 mcg. Take one cap by mouth on odd days and 2 caps on even days.     . carvedilol (COREG) 12.5 MG tablet Take 1 tablet (12.5 mg total) by mouth 2 (two) times daily with a meal. 60 tablet 11  . chlorzoxazone (PARAFON) 500 MG tablet Take 1 tablet (500 mg total) by mouth 3 (three) times daily as needed for muscle spasms. (Patient not taking: Reported on 01/19/2015) 60 tablet 1  . Cod Liver Oil OIL Take 15 mLs by mouth daily.    . colchicine 0.6 MG tablet Take 0.5 tablets (0.3 mg total) by mouth daily.    . furosemide (LASIX) 40 MG tablet Take 2 tablets (80 mg total) by mouth daily. 60 tablet 6  . glyBURIDE (DIABETA) 5 MG tablet Take 5 mg by mouth daily with breakfast. Take a 1/2 pill before breakfast and a whole pill before 2nd meal of the day.    . hydrALAZINE (APRESOLINE) 50  MG tablet Take 1.5 tablets (75 mg total) by mouth 3 (three) times daily. 90 tablet 3  . HYDROcodone-acetaminophen (NORCO/VICODIN) 5-325 MG per tablet Take 1 tablet by mouth every 6 (six) hours as needed for moderate pain. 60 tablet 0  . isosorbide mononitrate (IMDUR) 60 MG 24 hr tablet Take 1 tablet (60 mg total) by mouth daily. 30 tablet 6  . levothyroxine (SYNTHROID, LEVOTHROID) 175 MCG tablet Take 175 mcg by mouth daily before breakfast.    . nitroGLYCERIN (NITROSTAT) 0.4 MG SL tablet Place 1 tablet (0.4 mg total) under the tongue every 5 (five) minutes as needed for chest pain. (Patient not taking: Reported on 01/19/2015) 25 tablet 3  . olopatadine (PATANOL) 0.1 % ophthalmic solution Place 1 drop into both eyes 2 (two) times daily. 5 mL 3  . rosuvastatin (CRESTOR) 20 MG tablet Take 1 tablet (20 mg total) by mouth daily. 90 tablet 2   No current facility-administered medications for this visit.     Past Medical History  Diagnosis Date  . CAD (coronary artery disease)     a. s/p PCI to mid and mid-distal RCA, 40% sten L main, no sig dz LCx  b. myoview 11/24/2014 inferoapex ischemia, medical management due to high risk for contrast nephropathy  . HTN (hypertension)   . Hyperlipidemia   . Cerebrovascular accident   . CHF (congestive heart failure)  EF 35% echo 2012 ; EF 44% myoview 2012; EF 76% by [facility] November 2014  . Diabetes mellitus   . 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 Unspecified hypothyroidism   . Gout     Past Surgical History  Procedure Laterality Date  . Thyroidectomy      History   Social History  . Marital Status: Married    Spouse Name: N/A  . Number of Children: N/A  . Years of Education: N/A   Occupational History  . Not on file.   Social History Main Topics  . Smoking status: Former Smoker -- 0.50 packs/day    Types: Cigarettes    Quit date: 10/16/1953  . Smokeless tobacco: Not on file     Comment: quit in the 70's  . Alcohol Use: No    . Drug Use: No  . Sexual Activity: Not Currently   Other Topics Concern  . Not on file   Social History Narrative   Lives in [place] with his wife and has 6 kids.     ROS: no fevers or chills, productive cough, hemoptysis, dysphasia, odynophagia, melena, hematochezia, dysuria, hematuria, rash, seizure activity, orthopnea, PND, pedal edema, claudication. Remaining systems are negative.  Physical Exam: Well-developed well-nourished in no acute distress.  Skin is warm and dry.  HEENT is normal.  Neck is supple.  Chest is clear to auscultation with normal expansion.  Cardiovascular exam is regular rate and rhythm.  Abdominal exam nontender or distended. No masses palpated. Extremities show no edema. neuro grossly intact  ECG     This encounter was created in error - please disregard.

## 2015-03-26 ENCOUNTER — Encounter: Payer: PRIVATE HEALTH INSURANCE | Admitting: Cardiology

## 2015-04-06 ENCOUNTER — Encounter: Payer: Self-pay | Admitting: Cardiology

## 2015-04-20 ENCOUNTER — Encounter: Payer: Self-pay | Admitting: Family Medicine

## 2015-04-20 ENCOUNTER — Ambulatory Visit (INDEPENDENT_AMBULATORY_CARE_PROVIDER_SITE_OTHER): Payer: Medicare Other | Admitting: Family Medicine

## 2015-04-20 VITALS — BP 120/52 | HR 73 | Temp 98.5°F | Wt 178.0 lb

## 2015-04-20 DIAGNOSIS — E890 Postprocedural hypothyroidism: Secondary | ICD-10-CM

## 2015-04-20 DIAGNOSIS — M170 Bilateral primary osteoarthritis of knee: Secondary | ICD-10-CM | POA: Diagnosis not present

## 2015-04-20 MED ORDER — OLOPATADINE HCL 0.1 % OP SOLN: 1.0000 [drp] | Freq: Two times a day (BID) | OPHTHALMIC | Status: DC

## 2015-04-20 MED ORDER — METHYLPREDNISOLONE ACETATE 80 MG/ML IJ SUSP
80.0000 mg | Freq: Once | INTRAMUSCULAR | Status: AC
  Administered 2015-04-20: 80 mg via INTRAMUSCULAR

## 2015-04-20 MED ORDER — GLYBURIDE 5 MG PO TABS: 5.0000 mg | ORAL_TABLET | Freq: Every day | ORAL | Status: DC

## 2015-04-20 MED ORDER — HYDROCODONE-ACETAMINOPHEN 5-325 MG PO TABS: 1.0000 | ORAL_TABLET | Freq: Four times a day (QID) | ORAL | Status: DC | PRN

## 2015-04-20 NOTE — Assessment & Plan Note (Signed)
Symptomatically controlled. Levothyroxine 13475mcg-continue and recheck TSH next visit

## 2015-04-20 NOTE — Patient Instructions (Addendum)
Injected knees. Come see me before you start walking on crutches next time!   See me in 3 months-we will check in on your thyroid and a1c at that time. Steroids may run up your sugar some

## 2015-04-20 NOTE — Progress Notes (Signed)
[name], MD  Subjective:  [name] [name] is a 79 y.o. year old very pleasant male patient who presents with:  Hypothyroidism-symptomatically controlled, decreased 3 months ago to levothyroxine Lab Results  Component Value Date   TSH 0.13* 01/19/2015  ROS-No hair or nail changes. No heat/cold intolerance. No constipation or diarrhea. Denies shakiness or anxiety.  Bilateral knee arthritis- poor control -since January injection, did well until June then pain up as high as 10/10 with walking and using crutches. Requests repeat injection ROS- mild swelling, no erythema or warmth  Past Medical History- Stage IV renal disease, CHF, CAD, Type II DM  Medications- reviewed and updated Current Outpatient Prescriptions  Medication Sig Dispense Refill  . amLODipine (NORVASC) 5 MG tablet Take 1 tablet (5 mg total) by mouth daily. 60 tablet 12  . aspirin 325 MG tablet Take 325 mg by mouth daily.      . calcitRIOL (ROCALTROL) 0.25 MCG capsule 0.25 mcg. Take one cap by mouth on odd days and 2 caps on even days.     . carvedilol (COREG) 12.5 MG tablet Take 1 tablet (12.5 mg total) by mouth 2 (two) times daily with a meal. 60 tablet 11  . Cod Liver Oil OIL Take 15 mLs by mouth daily.    . colchicine 0.6 MG tablet Take 0.5 tablets (0.3 mg total) by mouth daily.    . furosemide (LASIX) 40 MG tablet Take 2 tablets (80 mg total) by mouth daily. 60 tablet 6  . glyBURIDE (DIABETA) 5 MG tablet Take 5 mg by mouth daily with breakfast. Take a 1/2 pill before breakfast and a whole pill before 2nd meal of the day.    . hydrALAZINE (APRESOLINE) 50 MG tablet Take 1.5 tablets (75 mg total) by mouth 3 (three) times daily. 90 tablet 3  . isosorbide mononitrate (IMDUR) 60 MG 24 hr tablet Take 1 tablet (60 mg total) by mouth daily. 30 tablet 6  . levothyroxine (SYNTHROID, LEVOTHROID) 175 MCG tablet Take 175 mcg by mouth daily before breakfast.    . olopatadine (PATANOL) 0.1 % ophthalmic solution Place 1  drop into both eyes 2 (two) times daily. 5 mL 3  . rosuvastatin (CRESTOR) 20 MG tablet Take 1 tablet (20 mg total) by mouth daily. 90 tablet 2  . acetaminophen (TYLENOL) 500 MG tablet Take 500 mg by mouth every 6 (six) hours as needed.    . chlorzoxazone (PARAFON) 500 MG tablet Take 1 tablet (500 mg total) by mouth 3 (three) times daily as needed for muscle spasms. (Patient not taking: Reported on 01/19/2015) 60 tablet 1  . HYDROcodone-acetaminophen (NORCO/VICODIN) 5-325 MG per tablet Take 1 tablet by mouth every 6 (six) hours as needed for moderate pain. (Patient not taking: Reported on 04/20/2015) 60 tablet 0  . nitroGLYCERIN (NITROSTAT) 0.4 MG SL tablet Place 1 tablet (0.4 mg total) under the tongue every 5 (five) minutes as needed for chest pain. (Patient not taking: Reported on 01/19/2015) 25 tablet 3   Objective: BP 120/52 mmHg  Pulse 73  Temp(Src) 98.5 F (36.9 C)  Wt 178 lb (80.74 kg) Gen: NAD, resting comfortably CV: RRR no murmurs rubs or gallops Lungs: CTAB no crackles, wheeze, rhonchi Abdomen: soft/nontender/nondistended/normal bowel sounds.   Bilateral Knee: inspection reveals no erythema. Small effusion bilaterally. No obvious bony abnormalities. Palpation normal with midline tenderness. No patellar tenderness or condyle tenderness. ROM normal in flexion and extension and lower leg rotation. Ligaments with solid consistent endpoints including ACL, PCL, LCL, MCL.  Negative Mcmurray's. Non painful patellar compression. Patellar and quadriceps tendons unremarkable. Hamstring and quadriceps strength is normal.  Ext: no edema Skin: warm, dry Neuro: grossly normal, moves all extremities  Procedure Note Consent obtained and verified. Sterile betadine prep. Furthur cleansed with alcohol. Topical analgesic spray: Ethyl chloride. Joint: bilateral knee Approached in typical fashion with: antero medial approach Completed without difficulty Meds: 1 cc 80mg /ml depo medrol with 3 cc of  lidocaine no epinephrine on both knees (total of 2 cc of depo medrol 80 mg/ml) Needle: 25 gauge 1 1/2.  Aftercare instructions and Red flags advised.  Assessment/Plan:  Hypothyroidism Symptomatically controlled. Levothyroxine 15875mcg-continue and recheck TSH next visit  Osteoarthritis severe bilateral knees Severe pain. Repeat bilateral injections today. Hopeful this lasts at least 3 months-lasted 5 months this past time.   3 months- a1c and tsh at that time (no hypoglycemia noted in addition to above ROS)  Meds ordered this encounter  Medications  . olopatadine (PATANOL) 0.1 % ophthalmic solution    Sig: Place 1 drop into both eyes 2 (two) times daily.    Dispense:  5 mL    Refill:  3  . glyBURIDE (DIABETA) 5 MG tablet    Sig: Take 1 tablet (5 mg total) by mouth daily with breakfast. Take a 1/2 pill before breakfast and a whole pill before 2nd meal of the day.    Dispense:  135 tablet    Refill:  3  . HYDROcodone-acetaminophen (NORCO/VICODIN) 5-325 MG per tablet    Sig: Take 1 tablet by mouth every 6 (six) hours as needed for moderate pain.    Dispense:  60 tablet    Refill:  0  . methylPREDNISolone acetate (DEPO-MEDROL) injection 80 mg    Sig:

## 2015-04-20 NOTE — Assessment & Plan Note (Signed)
Severe pain. Repeat bilateral injections today. Hopeful this lasts at least 3 months-lasted 5 months this past time.

## 2015-05-06 ENCOUNTER — Encounter: Payer: Self-pay | Admitting: Family Medicine

## 2015-06-22 ENCOUNTER — Encounter: Payer: Self-pay | Admitting: Cardiology

## 2015-06-22 ENCOUNTER — Ambulatory Visit (INDEPENDENT_AMBULATORY_CARE_PROVIDER_SITE_OTHER): Payer: Medicare Other | Admitting: Cardiology

## 2015-06-22 VITALS — BP 114/54 | HR 63 | Ht 69.0 in | Wt 183.5 lb

## 2015-06-22 DIAGNOSIS — I255 Ischemic cardiomyopathy: Secondary | ICD-10-CM | POA: Diagnosis not present

## 2015-06-22 DIAGNOSIS — I2510 Atherosclerotic heart disease of native coronary artery without angina pectoris: Secondary | ICD-10-CM

## 2015-06-22 DIAGNOSIS — I10 Essential (primary) hypertension: Secondary | ICD-10-CM | POA: Diagnosis not present

## 2015-06-22 NOTE — Assessment & Plan Note (Signed)
Continue beta blocker. Continue hydralazine/nitrates. No ACE inhibitor given baseline renal insufficiency. 

## 2015-06-22 NOTE — Assessment & Plan Note (Signed)
Blood pressure controlled. Continue present medications. 

## 2015-06-22 NOTE — Progress Notes (Signed)
HPI: FU coronary artery disease. His most recent catheterization was in March 2005 and showed a 40% left main, no significant disease in the circumflex, total occlusion of the ramus branch with a 50% first marginal, 40% proximal, followed by 95% mid, and 90% mid distal stenosis in the right coronary artery. The patient had successful PCI of the right coronary artery both the mid and mid-to- distal right lesions. Admitted 2/16 with CP and CHF; diuresed; echo EF 40-45; moderate MR, mild to moderate TR; moderately elevated pulmonary pressure; nuclear study showed EF 39; inferior/inferolateral infarct with mild peri-infarct ischemia. Treated medically. Since last seen, the patient denies any dyspnea on exertion, orthopnea, PND, pedal edema, palpitations, syncope or chest pain.   Current Outpatient Prescriptions  Medication Sig Dispense Refill  . acetaminophen (TYLENOL) 500 MG tablet Take 500 mg by mouth every 6 (six) hours as needed.    Marland Kitchen amLODipine (NORVASC) 10 MG tablet Take 10 mg by mouth daily.  4  . aspirin 325 MG tablet Take 325 mg by mouth daily.      . calcitRIOL (ROCALTROL) 0.25 MCG capsule 0.25 mcg. Take one cap by mouth on odd days and 2 caps on even days.     . carvedilol (COREG) 12.5 MG tablet Take 1 tablet (12.5 mg total) by mouth 2 (two) times daily with a meal. 60 tablet 11  . chlorzoxazone (PARAFON) 500 MG tablet Take 1 tablet (500 mg total) by mouth 3 (three) times daily as needed for muscle spasms. (Patient not taking: Reported on 01/19/2015) 60 tablet 1  . Cod Liver Oil OIL Take 15 mLs by mouth daily.    . colchicine 0.6 MG tablet Take 0.5 tablets (0.3 mg total) by mouth daily.    . furosemide (LASIX) 40 MG tablet Take 2 tablets (80 mg total) by mouth daily. 60 tablet 6  . glipiZIDE (GLUCOTROL) 5 MG tablet Take 5 mg by mouth daily.  3  . glyBURIDE (DIABETA) 5 MG tablet Take 1 tablet (5 mg total) by mouth daily with breakfast. Take a 1/2 pill before breakfast and a whole pill  before 2nd meal of the day. 135 tablet 3  . hydrALAZINE (APRESOLINE) 50 MG tablet Take 1.5 tablets (75 mg total) by mouth 3 (three) times daily. (Patient taking differently: Take 100 mg by mouth 3 (three) times daily. ) 90 tablet 3  . HYDROcodone-acetaminophen (NORCO/VICODIN) 5-325 MG per tablet Take 1 tablet by mouth every 6 (six) hours as needed for moderate pain. 60 tablet 0  . isosorbide mononitrate (IMDUR) 60 MG 24 hr tablet Take 1 tablet (60 mg total) by mouth daily. 30 tablet 6  . levothyroxine (SYNTHROID, LEVOTHROID) 200 MCG tablet Take 200 mcg by mouth daily.    . nitroGLYCERIN (NITROSTAT) 0.4 MG SL tablet Place 1 tablet (0.4 mg total) under the tongue every 5 (five) minutes as needed for chest pain. (Patient not taking: Reported on 01/19/2015) 25 tablet 3  . olopatadine (PATANOL) 0.1 % ophthalmic solution Place 1 drop into both eyes 2 (two) times daily. 5 mL 3  . rosuvastatin (CRESTOR) 20 MG tablet Take 1 tablet (20 mg total) by mouth daily. 90 tablet 2   No current facility-administered medications for this visit.     Past Medical History  Diagnosis Date  . CAD (coronary artery disease)     a. s/p PCI to mid and mid-distal RCA, 40% sten L main, no sig dz LCx  b. myoview 11/24/2014 inferoapex ischemia, medical management  due to high risk for contrast nephropathy  . HTN (hypertension)   . Hyperlipidemia   . Cerebrovascular accident   . CHF (congestive heart failure)     EF 35% echo 2012 ; EF 44% myoview 2012; EF 76% by [facility] November 2014  . Diabetes mellitus   . 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 Unspecified hypothyroidism   . Gout     Past Surgical History  Procedure Laterality Date  . Thyroidectomy      Social History   Social History  . Marital Status: Married    Spouse Name: N/A  . Number of Children: N/A  . Years of Education: N/A   Occupational History  . Not on file.   Social History Main Topics  . Smoking status: Former Smoker -- 0.50  packs/day    Types: Cigarettes    Quit date: 10/16/1953  . Smokeless tobacco: Not on file     Comment: quit in the 70's  . Alcohol Use: No  . Drug Use: No  . Sexual Activity: Not Currently   Other Topics Concern  . Not on file   Social History Narrative   Lives in [place] with his wife and has 6 kids.     ROS: no fevers or chills, productive cough, hemoptysis, dysphasia, odynophagia, melena, hematochezia, dysuria, hematuria, rash, seizure activity, orthopnea, PND, pedal edema, claudication. Remaining systems are negative.  Physical Exam: Well-developed well-nourished in no acute distress.  Skin is warm and dry.  HEENT is normal.  Neck is supple.  Chest is clear to auscultation with normal expansion.  Cardiovascular exam is regular rate and rhythm.  Abdominal exam nontender or distended. No masses palpated. Extremities show no edema. neuro grossly intact  ECG sinus rhythm with first-degree AV block and occasional PVC. Nonspecific ST changes.

## 2015-06-22 NOTE — Assessment & Plan Note (Signed)
Continue statin. 

## 2015-06-22 NOTE — Assessment & Plan Note (Signed)
Patient appears to be euvolemic on examination. Continue present dose of diuretic. His renal function is monitored by nephrology. He will take an additional 40 mg of Lasix for weight gain of 2 pounds. Continue low-sodium diet.

## 2015-06-22 NOTE — Assessment & Plan Note (Signed)
Continue aspirin and statin. 

## 2015-06-22 NOTE — Patient Instructions (Signed)
Continue same medications.   Your physician wants you to follow-up in: 6 months.  You will receive a reminder letter in the mail two months in advance. If you don't receive a letter, please call our office to schedule the follow-up appointment.  

## 2015-06-22 NOTE — Assessment & Plan Note (Signed)
Followed by nephrology. 

## 2015-07-17 ENCOUNTER — Other Ambulatory Visit: Payer: Self-pay | Admitting: Cardiology

## 2015-07-19 ENCOUNTER — Inpatient Hospital Stay (HOSPITAL_COMMUNITY)
Admission: EM | Admit: 2015-07-19 | Discharge: 2015-07-23 | DRG: 070 | Disposition: A | Discharge status: Home / Self Care | Payer: Medicare Other | Attending: Internal Medicine | Admitting: Internal Medicine

## 2015-07-19 ENCOUNTER — Emergency Department (HOSPITAL_COMMUNITY): Payer: Medicare Other

## 2015-07-19 ENCOUNTER — Encounter (HOSPITAL_COMMUNITY): Payer: Self-pay | Admitting: Emergency Medicine

## 2015-07-19 DIAGNOSIS — I10 Essential (primary) hypertension: Secondary | ICD-10-CM | POA: Diagnosis not present

## 2015-07-19 DIAGNOSIS — I5041 Acute combined systolic (congestive) and diastolic (congestive) heart failure: Secondary | ICD-10-CM | POA: Diagnosis not present

## 2015-07-19 DIAGNOSIS — Z7984 Long term (current) use of oral hypoglycemic drugs: Secondary | ICD-10-CM

## 2015-07-19 DIAGNOSIS — I5042 Chronic combined systolic (congestive) and diastolic (congestive) heart failure: Secondary | ICD-10-CM | POA: Diagnosis not present

## 2015-07-19 DIAGNOSIS — E785 Hyperlipidemia, unspecified: Secondary | ICD-10-CM | POA: Diagnosis present

## 2015-07-19 DIAGNOSIS — I2510 Atherosclerotic heart disease of native coronary artery without angina pectoris: Secondary | ICD-10-CM | POA: Diagnosis present

## 2015-07-19 DIAGNOSIS — Z833 Family history of diabetes mellitus: Secondary | ICD-10-CM

## 2015-07-19 DIAGNOSIS — E1122 Type 2 diabetes mellitus with diabetic chronic kidney disease: Secondary | ICD-10-CM | POA: Diagnosis present

## 2015-07-19 DIAGNOSIS — N289 Disorder of kidney and ureter, unspecified: Secondary | ICD-10-CM

## 2015-07-19 DIAGNOSIS — I482 Chronic atrial fibrillation, unspecified: Secondary | ICD-10-CM

## 2015-07-19 DIAGNOSIS — E0590 Thyrotoxicosis, unspecified without thyrotoxic crisis or storm: Secondary | ICD-10-CM | POA: Diagnosis present

## 2015-07-19 DIAGNOSIS — J9601 Acute respiratory failure with hypoxia: Secondary | ICD-10-CM | POA: Diagnosis present

## 2015-07-19 DIAGNOSIS — Z8249 Family history of ischemic heart disease and other diseases of the circulatory system: Secondary | ICD-10-CM | POA: Diagnosis not present

## 2015-07-19 DIAGNOSIS — M109 Gout, unspecified: Secondary | ICD-10-CM | POA: Diagnosis present

## 2015-07-19 DIAGNOSIS — J9600 Acute respiratory failure, unspecified whether with hypoxia or hypercapnia: Secondary | ICD-10-CM

## 2015-07-19 DIAGNOSIS — R4182 Altered mental status, unspecified: Secondary | ICD-10-CM

## 2015-07-19 DIAGNOSIS — Z79899 Other long term (current) drug therapy: Secondary | ICD-10-CM | POA: Diagnosis not present

## 2015-07-19 DIAGNOSIS — E118 Type 2 diabetes mellitus with unspecified complications: Secondary | ICD-10-CM

## 2015-07-19 DIAGNOSIS — I5043 Acute on chronic combined systolic (congestive) and diastolic (congestive) heart failure: Secondary | ICD-10-CM | POA: Diagnosis present

## 2015-07-19 DIAGNOSIS — E440 Moderate protein-calorie malnutrition: Secondary | ICD-10-CM | POA: Insufficient documentation

## 2015-07-19 DIAGNOSIS — R404 Transient alteration of awareness: Secondary | ICD-10-CM | POA: Diagnosis not present

## 2015-07-19 DIAGNOSIS — I480 Paroxysmal atrial fibrillation: Secondary | ICD-10-CM | POA: Diagnosis not present

## 2015-07-19 DIAGNOSIS — Z87891 Personal history of nicotine dependence: Secondary | ICD-10-CM | POA: Diagnosis not present

## 2015-07-19 DIAGNOSIS — I129 Hypertensive chronic kidney disease with stage 1 through stage 4 chronic kidney disease, or unspecified chronic kidney disease: Secondary | ICD-10-CM | POA: Diagnosis present

## 2015-07-19 DIAGNOSIS — Z8673 Personal history of transient ischemic attack (TIA), and cerebral infarction without residual deficits: Secondary | ICD-10-CM

## 2015-07-19 DIAGNOSIS — N320 Bladder-neck obstruction: Secondary | ICD-10-CM | POA: Diagnosis present

## 2015-07-19 DIAGNOSIS — G9340 Encephalopathy, unspecified: Principal | ICD-10-CM

## 2015-07-19 DIAGNOSIS — Z91018 Allergy to other foods: Secondary | ICD-10-CM

## 2015-07-19 DIAGNOSIS — R339 Retention of urine, unspecified: Secondary | ICD-10-CM | POA: Diagnosis present

## 2015-07-19 DIAGNOSIS — R410 Disorientation, unspecified: Secondary | ICD-10-CM | POA: Diagnosis not present

## 2015-07-19 DIAGNOSIS — R0902 Hypoxemia: Secondary | ICD-10-CM

## 2015-07-19 DIAGNOSIS — R509 Fever, unspecified: Secondary | ICD-10-CM

## 2015-07-19 DIAGNOSIS — E872 Acidosis: Secondary | ICD-10-CM | POA: Diagnosis present

## 2015-07-19 DIAGNOSIS — I4891 Unspecified atrial fibrillation: Secondary | ICD-10-CM | POA: Diagnosis not present

## 2015-07-19 DIAGNOSIS — J189 Pneumonia, unspecified organism: Secondary | ICD-10-CM | POA: Diagnosis present

## 2015-07-19 DIAGNOSIS — N184 Chronic kidney disease, stage 4 (severe): Secondary | ICD-10-CM | POA: Diagnosis present

## 2015-07-19 DIAGNOSIS — I959 Hypotension, unspecified: Secondary | ICD-10-CM | POA: Diagnosis present

## 2015-07-19 DIAGNOSIS — R29810 Facial weakness: Secondary | ICD-10-CM | POA: Diagnosis present

## 2015-07-19 DIAGNOSIS — N179 Acute kidney failure, unspecified: Secondary | ICD-10-CM | POA: Diagnosis present

## 2015-07-19 DIAGNOSIS — Z7982 Long term (current) use of aspirin: Secondary | ICD-10-CM | POA: Diagnosis not present

## 2015-07-19 DIAGNOSIS — R079 Chest pain, unspecified: Secondary | ICD-10-CM | POA: Diagnosis present

## 2015-07-19 DIAGNOSIS — E890 Postprocedural hypothyroidism: Secondary | ICD-10-CM | POA: Diagnosis present

## 2015-07-19 DIAGNOSIS — E039 Hypothyroidism, unspecified: Secondary | ICD-10-CM | POA: Diagnosis present

## 2015-07-19 DIAGNOSIS — E1129 Type 2 diabetes mellitus with other diabetic kidney complication: Secondary | ICD-10-CM | POA: Diagnosis present

## 2015-07-19 DIAGNOSIS — N189 Chronic kidney disease, unspecified: Secondary | ICD-10-CM

## 2015-07-19 LAB — DIFFERENTIAL
BASOS PCT: 0 %
Basophils Absolute: 0 10*3/uL (ref 0.0–0.1)
EOS ABS: 0.1 10*3/uL (ref 0.0–0.7)
EOS PCT: 1 %
LYMPHS PCT: 16 %
Lymphs Abs: 1.3 10*3/uL (ref 0.7–4.0)
MONOS PCT: 9 %
Monocytes Absolute: 0.7 10*3/uL (ref 0.1–1.0)
NEUTROS ABS: 6.2 10*3/uL (ref 1.7–7.7)
Neutrophils Relative %: 74 %

## 2015-07-19 LAB — COMPREHENSIVE METABOLIC PANEL
ALK PHOS: 35 U/L — AB (ref 38–126)
ALT: 10 U/L — AB (ref 17–63)
AST: 18 U/L (ref 15–41)
Albumin: 3.8 g/dL (ref 3.5–5.0)
Anion gap: 11 (ref 5–15)
BILIRUBIN TOTAL: 0.7 mg/dL (ref 0.3–1.2)
BUN: 88 mg/dL — ABNORMAL HIGH (ref 6–20)
CALCIUM: 9.4 mg/dL (ref 8.9–10.3)
CO2: 21 mmol/L — ABNORMAL LOW (ref 22–32)
CREATININE: 4.7 mg/dL — AB (ref 0.61–1.24)
Chloride: 103 mmol/L (ref 101–111)
GFR, EST AFRICAN AMERICAN: 13 mL/min — AB (ref >60)
GFR, EST NON AFRICAN AMERICAN: 11 mL/min — AB (ref >60)
Glucose, Bld: 169 mg/dL — ABNORMAL HIGH (ref 65–99)
Potassium: 4.5 mmol/L (ref 3.5–5.1)
Sodium: 135 mmol/L (ref 135–145)
TOTAL PROTEIN: 7.5 g/dL (ref 6.5–8.1)

## 2015-07-19 LAB — CBC
HCT: 33.8 % — ABNORMAL LOW (ref 39.0–52.0)
Hemoglobin: 10.8 g/dL — ABNORMAL LOW (ref 13.0–17.0)
MCH: 29.9 pg (ref 26.0–34.0)
MCHC: 32 g/dL (ref 30.0–36.0)
MCV: 93.6 fL (ref 78.0–100.0)
PLATELETS: 143 10*3/uL — AB (ref 150–400)
RBC: 3.61 MIL/uL — AB (ref 4.22–5.81)
RDW: 12.7 % (ref 11.5–15.5)
WBC: 8.3 10*3/uL (ref 4.0–10.5)

## 2015-07-19 LAB — I-STAT CHEM 8, ED
BUN: 86 mg/dL — ABNORMAL HIGH (ref 6–20)
CALCIUM ION: 1.17 mmol/L (ref 1.13–1.30)
CREATININE: 4.6 mg/dL — AB (ref 0.61–1.24)
Chloride: 105 mmol/L (ref 101–111)
GLUCOSE: 175 mg/dL — AB (ref 65–99)
HCT: 36 % — ABNORMAL LOW (ref 39.0–52.0)
HEMOGLOBIN: 12.2 g/dL — AB (ref 13.0–17.0)
Potassium: 4.5 mmol/L (ref 3.5–5.1)
Sodium: 138 mmol/L (ref 135–145)
TCO2: 19 mmol/L (ref 0–100)

## 2015-07-19 LAB — PROTIME-INR
INR: 1.36 (ref 0.00–1.49)
PROTHROMBIN TIME: 16.9 s — AB (ref 11.6–15.2)

## 2015-07-19 LAB — APTT: aPTT: 54 seconds — ABNORMAL HIGH (ref 24–37)

## 2015-07-19 LAB — I-STAT TROPONIN, ED: TROPONIN I, POC: 0.02 ng/mL (ref 0.00–0.08)

## 2015-07-19 NOTE — ED Notes (Signed)
Family reports when she got home from work today pt was confused and unable to walk- weakness to both legs. Pt presents with slight L facial droop. Unknown lsn. Pt c/o discomfort in chest with sob. Disoriented to time. Grip strengths equal.

## 2015-07-19 NOTE — ED Provider Notes (Signed)
CSN: [id number]     Arrival date & time 07/19/15  1919 History   First MD Initiated Contact with Patient 07/19/15 2045     Chief Complaint  Patient presents with  . Facial Droop  . Weakness    Patient is a 79 y.o. male presenting with altered mental status. The history is provided by the patient, the EMS personnel, a relative and medical records.  Altered Mental Status Presenting symptoms: confusion and disorientation   Presenting symptoms comment:  Weakness- bilat LE, as well as possible left facial droop Severity:  Moderate Most recent episode:  Today Episode history:  Single Duration: unknown when onest- last seen normal 1 day ago. Timing:  Constant Progression:  Improving Chronicity:  New Context: not head injury, not a recent change in medication and not a recent illness   Associated symptoms: weakness   Associated symptoms: no abdominal pain, no fever, no rash and no vomiting   Associated symptoms comment:  Pt did report he had some central chest pain earlier that has completely resolved    Past Medical History  Diagnosis Date  . CAD (coronary artery disease)     a. s/p PCI to mid and mid-distal RCA, 40% sten L main, no sig dz LCx  b. myoview 11/24/2014 inferoapex ischemia, medical management due to high risk for contrast nephropathy  . HTN (hypertension)   . Hyperlipidemia   . Cerebrovascular accident (HCC)   . CHF (congestive heart failure) (HCC)     EF 35% echo 2012 ; EF 44% myoview 2012; EF 76% by [facility] November 2014  . Diabetes mellitus   . 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 Unspecified hypothyroidism   . Gout    Past Surgical History  Procedure Laterality Date  . Thyroidectomy     Family History  Problem Relation Age of Onset  . Kidney failure Mother   . Diabetes Mother   . Cancer Father     lung  . Heart attack Brother     massive heart attack in 41s  . Hypertension Mother    Social History  Substance Use Topics  . Smoking status:  Former Smoker -- 0.50 packs/day    Types: Cigarettes    Quit date: 10/16/1953  . Smokeless tobacco: None     Comment: quit in the 70's  . Alcohol Use: No    Review of Systems  Constitutional: Negative for fever.  HENT: Negative for rhinorrhea.   Eyes: Negative for visual disturbance.  Respiratory: Negative for shortness of breath.   Cardiovascular: Positive for chest pain.  Gastrointestinal: Negative for vomiting and abdominal pain.  Genitourinary: Negative for decreased urine volume.  Skin: Negative for rash.  Allergic/Immunologic: Negative for immunocompromised state.  Neurological: Positive for facial asymmetry and weakness.  Psychiatric/Behavioral: Positive for confusion.    Allergies  Tomato  Home Medications   Prior to Admission medications   Medication Sig Start Date End Date Taking? Authorizing Provider  acetaminophen (TYLENOL) 500 MG tablet Take 500 mg by mouth 2 (two) times daily.    Yes Historical Provider, MD  amLODipine (NORVASC) 10 MG tablet Take 10 mg by mouth daily. 04/30/15  Yes Historical Provider, MD  aspirin 325 MG tablet Take 325 mg by mouth daily.     Yes Historical Provider, MD  calcitRIOL (ROCALTROL) 0.25 MCG capsule Take 0.25-0.5 mcg by mouth See admin instructions. Pt takes 0.59mcg on odd days and 0.7mcg on even days as directed   Yes  Historical Provider, MD  carvedilol (COREG) 12.5 MG tablet Take 1 tablet (12.5 mg total) by mouth 2 (two) times daily with a meal. 01/11/15  Yes [name] Patty, MD  [facility] Liver Oil OIL Take 15 mLs by mouth daily.   Yes Historical Provider, MD  colchicine 0.6 MG tablet Take 0.5 tablets (0.3 mg total) by mouth daily. Patient taking differently: Take 0.6 mg by mouth daily.  09/06/13  Yes Ripudeep Jenna Luo, MD  furosemide (LASIX) 40 MG tablet TAKE 2 TABLETS (80 MG TOTAL) BY MOUTH DAILY. 07/19/15  Yes [name], MD  glipiZIDE (GLUCOTROL) 5 MG tablet Take 2.5-5 mg by mouth See admin instructions. Pt takes 2.5mg  in am before  breakfast and 5mg  in pm before dinner 05/01/15  Yes Historical Provider, MD  glyBURIDE (DIABETA) 5 MG tablet Take 1 tablet (5 mg total) by mouth daily with breakfast. Take a 1/2 pill before breakfast and a whole pill before 2nd meal of the day. Patient taking differently: Take 2.5-5 mg by mouth 2 (two) times daily with a meal. Take a 1/2 pill before breakfast and a whole pill before 2nd meal of the day. 04/20/15  Yes [name], MD  hydrALAZINE (APRESOLINE) 50 MG tablet Take 1.5 tablets (75 mg total) by mouth 3 (three) times daily. Patient taking differently: Take 100 mg by mouth 3 (three) times daily.  12/15/14  Yes Laurey Morale, MD  isosorbide mononitrate (IMDUR) 60 MG 24 hr tablet Take 1 tablet (60 mg total) by mouth daily. 02/15/15  Yes [name], MD  levothyroxine (SYNTHROID, LEVOTHROID) 200 MCG tablet Take 200 mcg by mouth daily. 03/25/15  Yes Historical Provider, MD  nitroGLYCERIN (NITROSTAT) 0.4 MG SL tablet Place 1 tablet (0.4 mg total) under the tongue every 5 (five) minutes as needed for chest pain. 11/25/14 04/25/17 Yes Azalee Course, PA  olopatadine (PATANOL) 0.1 % ophthalmic solution Place 1 drop into both eyes 2 (two) times daily. 04/20/15  Yes [name], MD  rosuvastatin (CRESTOR) 20 MG tablet Take 1 tablet (20 mg total) by mouth daily. 03/12/15  Yes [name], MD  chlorzoxazone (PARAFON) 500 MG tablet Take 1 tablet (500 mg total) by mouth 3 (three) times daily as needed for muscle spasms. Patient not taking: Reported on 01/19/2015 09/18/14   [name], MD  HYDROcodone-acetaminophen (NORCO/VICODIN) 5-325 MG per tablet Take 1 tablet by mouth every 6 (six) hours as needed for moderate pain. Patient not taking: Reported on 07/19/2015 04/20/15   [name], MD   BP 136/60 mmHg  Pulse 80  Temp(Src) 99.3 F (37.4 C) (Oral)  Resp 18  Ht 5' 10.8" (1.798 m)  Wt 171 lb 12.8 oz (77.928 kg)  BMI 24.11 kg/m2  SpO2 88% Physical Exam  Constitutional: He appears  well-developed and well-nourished. No distress.  HENT:  Head: Normocephalic and atraumatic.  Eyes: Right eye exhibits no discharge. Left eye exhibits no discharge.  Neck: No tracheal deviation present.  Cardiovascular: Normal rate and regular rhythm.   Pulmonary/Chest: Effort normal and breath sounds normal. No respiratory distress.  Abdominal: Soft. He exhibits no distension. There is no tenderness.  Musculoskeletal: He exhibits no edema.  Neurological: He is alert.  Oriented to name, place, situation, not time. Slight left facial droop at rest though no asymmetry with smile. Otherwise CN intact. Strength 5/5 upper extremities. bilat LE with drift, diminshed strength at hips that pt reports is not new, 5/5 at ankle DF/PF. Sensation intact.  Skin: Skin is warm and dry.  Psychiatric: He has a normal mood and affect. His behavior is normal.    ED Course  Procedures (including critical care time) Labs Review Labs Reviewed  PROTIME-INR - Abnormal; Notable for the following:    Prothrombin Time 16.9 (*)    All other components within normal limits  APTT - Abnormal; Notable for the following:    aPTT 54 (*)    All other components within normal limits  CBC - Abnormal; Notable for the following:    RBC 3.61 (*)    Hemoglobin 10.8 (*)    HCT 33.8 (*)    Platelets 143 (*)    All other components within normal limits  COMPREHENSIVE METABOLIC PANEL - Abnormal; Notable for the following:    CO2 21 (*)    Glucose, Bld 169 (*)    BUN 88 (*)    Creatinine, Ser 4.70 (*)    ALT 10 (*)    Alkaline Phosphatase 35 (*)    GFR calc non Af Amer 11 (*)    GFR calc Af Amer 13 (*)    All other components within normal limits  URINALYSIS, ROUTINE W REFLEX MICROSCOPIC (NOT AT [facility]) - Abnormal; Notable for the following:    APPearance CLOUDY (*)    Protein, ur >300 (*)    All other components within normal limits  CBC - Abnormal; Notable for the following:    RBC 3.55 (*)    Hemoglobin 10.5 (*)     HCT 33.3 (*)    Platelets 138 (*)    All other components within normal limits  BASIC METABOLIC PANEL - Abnormal; Notable for the following:    CO2 21 (*)    Glucose, Bld 140 (*)    BUN 91 (*)    Creatinine, Ser 4.57 (*)    GFR calc non Af Amer 11 (*)    GFR calc Af Amer 13 (*)    All other components within normal limits  TROPONIN I - Abnormal; Notable for the following:    Troponin I 0.05 (*)    All other components within normal limits  GLUCOSE, CAPILLARY - Abnormal; Notable for the following:    Glucose-Capillary 140 (*)    All other components within normal limits  URINE MICROSCOPIC-ADD ON - Abnormal; Notable for the following:    Bacteria, UA FEW (*)    Casts GRANULAR CAST (*)    All other components within normal limits  GLUCOSE, CAPILLARY - Abnormal; Notable for the following:    Glucose-Capillary 107 (*)    All other components within normal limits  I-STAT CHEM 8, ED - Abnormal; Notable for the following:    BUN 86 (*)    Creatinine, Ser 4.60 (*)    Glucose, Bld 175 (*)    Hemoglobin 12.2 (*)    HCT 36.0 (*)    All other components within normal limits  I-STAT ARTERIAL BLOOD GAS, ED - Abnormal; Notable for the following:    pCO2 arterial 33.2 (*)    pO2, Arterial 71.0 (*)    Acid-base deficit 3.0 (*)    All other components within normal limits  MRSA PCR SCREENING  URINE CULTURE  CULTURE, BLOOD (ROUTINE X 2)  CULTURE, BLOOD (ROUTINE X 2)  DIFFERENTIAL  LACTIC ACID, PLASMA  LACTIC ACID, PLASMA  GLUCOSE, CAPILLARY  HEMOGLOBIN A1C  I-STAT TROPOININ, ED  CBG MONITORING, ED    Imaging Review Ct Head Wo Contrast  07/19/2015   CLINICAL DATA:  Confusion with weakness and gait disturbance today. Left facial droop. Initial encounter.  EXAM: CT HEAD WITHOUT CONTRAST  TECHNIQUE: Contiguous axial images were obtained from the base of the skull through the vertex without intravenous contrast.  COMPARISON:  Head CT 12/12/2010.  MRI brain 12/13/2010.  FINDINGS: There is  no evidence of acute intracranial hemorrhage, mass lesion, brain edema or extra-axial fluid collection. Diffuse dural calcifications and mild symmetric thickening of the tentorium are similar to the prior study. There is no evidence of hydrocephalus or cortical based infarct. Mild chronic small vessel ischemic changes are stable. Intracranial vascular calcifications noted.  The visualized paranasal sinuses, mastoid air cells and middle ears are clear. The calvarium is intact.  IMPRESSION: Stable examination.  No acute intracranial findings.   Electronically Signed   By: Carey Bullocks M.D.   On: 07/19/2015 21:26   Mr Brain Wo Contrast  07/20/2015   CLINICAL DATA:  Initial evaluation for acute left facial droop  EXAM: MRI HEAD WITHOUT CONTRAST  TECHNIQUE: Multiplanar, multiecho pulse sequences of the brain and surrounding structures were obtained without intravenous contrast.  COMPARISON:  Prior CT from 07/19/2015.  FINDINGS: No abnormal foci of restricted diffusion to suggest acute intracranial infarct. Gray-white matter differentiation maintained. Normal intravascular flow voids are preserved. No acute or chronic intracranial hemorrhage.  Mild diffuse prominence of the CSF containing spaces is compatible with generalized cerebral atrophy. Patchy T2/FLAIR hyperintensity within the periventricular and deep white matter most consistent with chronic small vessel ischemic disease.  No mass lesion, midline shift or mass effect. No hydrocephalus. No extra-axial fluid collection.  Craniocervical junction within normal limits. Pituitary gland unremarkable.  No acute abnormality about the orbits.  Mild scattered mucosal thickening within the ethmoidal air cells. Paranasal sinuses are otherwise clear. Minimal opacity within the inferior left mastoid air cells. Inner ear structures grossly normal.  Bone marrow signal intensity within normal limits. Scalp soft tissues unremarkable.  IMPRESSION: 1. No acute intracranial  infarct or other process identified. 2. Mild atrophy with chronic small vessel ischemic disease.   Electronically Signed   By: Rise Mu M.D.   On: 07/20/2015 05:49   Dg Chest Port 1 View  07/20/2015   CLINICAL DATA:  Fever.  EXAM: PORTABLE CHEST 1 VIEW  COMPARISON:  12/10/2014  FINDINGS: Cardiac silhouette remains enlarged. Thoracic aortic calcification is noted. There is a small amount of thickening or fluid in the right minor fissure, decreased from the prior study. Pulmonary vascular congestion and mild interstitial densities bilaterally are stable to slightly improved from the prior study. There is improved aeration of the right lung base. No segmental airspace consolidation or pneumothorax is seen. No acute osseous abnormality is identified.  IMPRESSION: Cardiomegaly with mild pulmonary vascular congestion and mild interstitial edema, stable to slightly improved from the prior study. Likely small volume pleural fluid in the minor fissure.   Electronically Signed   By: Sebastian Ache M.D.   On: 07/20/2015 14:50   I have personally reviewed and evaluated these images and lab results as part of my medical decision-making.   EKG Interpretation   Date/Time:  Monday July 19 2015 19:31:30 EDT Ventricular Rate:  83 PR Interval:  224 QRS Duration: 94 QT Interval:  398 QTC Calculation: 467 R Axis:   53 Text Interpretation:  Sinus rhythm with 1st degree A-V block with  Premature atrial complexes with Abberant conduction Nonspecific ST and T  wave abnormality Prolonged QT Abnormal ECG ED PHYSICIAN INTERPRETATION  AVAILABLE IN [facility]  Confirmed by TEST, Record ([id number]) on  07/20/2015 7:50:43 AM      MDM   Final diagnoses:  Transient alteration of awareness    79 year old male with history of CVA (unclear details or residual deficits), CHF, CAD, CKD, diabetes, hypertension, hyperlipidemia, hypothyroidism, myopathy presenting with altered mental status as above. Found by  family members confused with bilateral lower extremity weakness and slight left facial droop. Last seen normal by family a day ago. Since arrival to hospital family and patient report that these deficits and confusion have improved significantly and patient is almost back to baseline. AF, VSS. CT head neg for acute bleed. Workup notable for mild anemia at 10.5, negative troponin, no acute changes on chest x-ray, Cr 4.6 from baseline around 3.7-8. Unclear etiology for altered state. However, patient with episodes of apnea while sleeping, requiring oxygen for desaturations to 70s. Possibly transient hypoxia contributing to presentation. Consulted neurology who recommended an MRI. Admitted to medicine to await MRI and further workup for altered mental status.  Discussed with Dr. Clayborne Dana who oversaw management of this pt.   Urban Gibson, MD 07/20/15 1620  Marily Memos, MD 07/21/15 [phone]

## 2015-07-20 ENCOUNTER — Encounter (HOSPITAL_COMMUNITY): Payer: Self-pay | Admitting: *Deleted

## 2015-07-20 ENCOUNTER — Inpatient Hospital Stay (HOSPITAL_COMMUNITY): Payer: Medicare Other

## 2015-07-20 DIAGNOSIS — R410 Disorientation, unspecified: Secondary | ICD-10-CM

## 2015-07-20 DIAGNOSIS — E440 Moderate protein-calorie malnutrition: Secondary | ICD-10-CM | POA: Insufficient documentation

## 2015-07-20 DIAGNOSIS — I5042 Chronic combined systolic (congestive) and diastolic (congestive) heart failure: Secondary | ICD-10-CM

## 2015-07-20 DIAGNOSIS — N289 Disorder of kidney and ureter, unspecified: Secondary | ICD-10-CM

## 2015-07-20 DIAGNOSIS — E118 Type 2 diabetes mellitus with unspecified complications: Secondary | ICD-10-CM

## 2015-07-20 LAB — CBC
HCT: 33.3 % — ABNORMAL LOW (ref 39.0–52.0)
HEMOGLOBIN: 10.5 g/dL — AB (ref 13.0–17.0)
MCH: 29.6 pg (ref 26.0–34.0)
MCHC: 31.5 g/dL (ref 30.0–36.0)
MCV: 93.8 fL (ref 78.0–100.0)
Platelets: 138 10*3/uL — ABNORMAL LOW (ref 150–400)
RBC: 3.55 MIL/uL — ABNORMAL LOW (ref 4.22–5.81)
RDW: 12.8 % (ref 11.5–15.5)
WBC: 7.7 10*3/uL (ref 4.0–10.5)

## 2015-07-20 LAB — GLUCOSE, CAPILLARY
GLUCOSE-CAPILLARY: 107 mg/dL — AB (ref 65–99)
GLUCOSE-CAPILLARY: 96 mg/dL (ref 65–99)
GLUCOSE-CAPILLARY: 181 mg/dL — AB (ref 65–99)
Glucose-Capillary: 140 mg/dL — ABNORMAL HIGH (ref 65–99)
Glucose-Capillary: 219 mg/dL — ABNORMAL HIGH (ref 65–99)
Glucose-Capillary: 222 mg/dL — ABNORMAL HIGH (ref 65–99)

## 2015-07-20 LAB — TROPONIN I: Troponin I: 0.05 ng/mL — ABNORMAL HIGH (ref <0.031)

## 2015-07-20 LAB — I-STAT ARTERIAL BLOOD GAS, ED
ACID-BASE DEFICIT: 3 mmol/L — AB (ref 0.0–2.0)
Bicarbonate: 21 mEq/L (ref 20.0–24.0)
O2 SAT: 94 %
TCO2: 22 mmol/L (ref 0–100)
pCO2 arterial: 33.2 mmHg — ABNORMAL LOW (ref 35.0–45.0)
pH, Arterial: 7.409 (ref 7.350–7.450)
pO2, Arterial: 71 mmHg — ABNORMAL LOW (ref 80.0–100.0)

## 2015-07-20 LAB — URINALYSIS, ROUTINE W REFLEX MICROSCOPIC
BILIRUBIN URINE: NEGATIVE
Glucose, UA: NEGATIVE mg/dL
HGB URINE DIPSTICK: NEGATIVE
KETONES UR: NEGATIVE mg/dL
Leukocytes, UA: NEGATIVE
Nitrite: NEGATIVE
Protein, ur: >300 mg/dL — AB
SPECIFIC GRAVITY, URINE: 1.017 (ref 1.005–1.030)
UROBILINOGEN UA: 0.2 mg/dL (ref 0.0–1.0)
pH: 5 (ref 5.0–8.0)

## 2015-07-20 LAB — BASIC METABOLIC PANEL
Anion gap: 11 (ref 5–15)
BUN: 91 mg/dL — ABNORMAL HIGH (ref 6–20)
CALCIUM: 9.4 mg/dL (ref 8.9–10.3)
CHLORIDE: 105 mmol/L (ref 101–111)
CO2: 21 mmol/L — ABNORMAL LOW (ref 22–32)
CREATININE: 4.57 mg/dL — AB (ref 0.61–1.24)
GFR calc non Af Amer: 11 mL/min — ABNORMAL LOW (ref >60)
GFR, EST AFRICAN AMERICAN: 13 mL/min — AB (ref >60)
Glucose, Bld: 140 mg/dL — ABNORMAL HIGH (ref 65–99)
Potassium: 4.3 mmol/L (ref 3.5–5.1)
SODIUM: 137 mmol/L (ref 135–145)

## 2015-07-20 LAB — MRSA PCR SCREENING: MRSA by PCR: NEGATIVE

## 2015-07-20 LAB — URINE MICROSCOPIC-ADD ON

## 2015-07-20 LAB — LACTIC ACID, PLASMA
LACTIC ACID, VENOUS: 0.7 mmol/L (ref 0.5–2.0)
LACTIC ACID, VENOUS: 0.7 mmol/L (ref 0.5–2.0)

## 2015-07-20 MED ORDER — TAMSULOSIN HCL 0.4 MG PO CAPS
0.4000 mg | ORAL_CAPSULE | Freq: Every day | ORAL | Status: DC
  Administered 2015-07-20 – 2015-07-23 (×4): 0.4 mg via ORAL
  Filled 2015-07-20 (×4): qty 1

## 2015-07-20 MED ORDER — LEVOTHYROXINE SODIUM 200 MCG PO TABS
200.0000 ug | ORAL_TABLET | Freq: Every day | ORAL | Status: DC
  Administered 2015-07-21: 200 ug via ORAL
  Filled 2015-07-20 (×2): qty 2
  Filled 2015-07-20 (×4): qty 1

## 2015-07-20 MED ORDER — HEPARIN SODIUM (PORCINE) 5000 UNIT/ML IJ SOLN
5000.0000 [IU] | Freq: Three times a day (TID) | INTRAMUSCULAR | Status: DC
  Administered 2015-07-20 – 2015-07-21 (×5): 5000 [IU] via SUBCUTANEOUS
  Filled 2015-07-20 (×5): qty 1

## 2015-07-20 MED ORDER — INSULIN ASPART 100 UNIT/ML ~~LOC~~ SOLN
0.0000 [IU] | Freq: Three times a day (TID) | SUBCUTANEOUS | Status: DC
  Administered 2015-07-20 – 2015-07-21 (×3): 3 [IU] via SUBCUTANEOUS
  Administered 2015-07-21 – 2015-07-22 (×2): 2 [IU] via SUBCUTANEOUS
  Administered 2015-07-22: 3 [IU] via SUBCUTANEOUS
  Administered 2015-07-22: 5 [IU] via SUBCUTANEOUS
  Administered 2015-07-23: 2 [IU] via SUBCUTANEOUS

## 2015-07-20 MED ORDER — ROSUVASTATIN CALCIUM 20 MG PO TABS
20.0000 mg | ORAL_TABLET | Freq: Every day | ORAL | Status: DC
  Administered 2015-07-21 – 2015-07-23 (×3): 20 mg via ORAL
  Filled 2015-07-20 (×4): qty 1

## 2015-07-20 MED ORDER — CETYLPYRIDINIUM CHLORIDE 0.05 % MT LIQD
7.0000 mL | Freq: Two times a day (BID) | OROMUCOSAL | Status: DC
  Administered 2015-07-20 – 2015-07-23 (×6): 7 mL via OROMUCOSAL

## 2015-07-20 MED ORDER — ONDANSETRON HCL 4 MG PO TABS: 4.0000 mg | ORAL_TABLET | Freq: Four times a day (QID) | ORAL | Status: DC | PRN

## 2015-07-20 MED ORDER — OLOPATADINE HCL 0.1 % OP SOLN
1.0000 [drp] | Freq: Two times a day (BID) | OPHTHALMIC | Status: DC
  Administered 2015-07-20 – 2015-07-23 (×6): 1 [drp] via OPHTHALMIC
  Filled 2015-07-20: qty 5

## 2015-07-20 MED ORDER — ISOSORBIDE MONONITRATE ER 60 MG PO TB24
60.0000 mg | ORAL_TABLET | Freq: Every day | ORAL | Status: DC
  Administered 2015-07-21 – 2015-07-23 (×3): 60 mg via ORAL
  Filled 2015-07-20 (×4): qty 1

## 2015-07-20 MED ORDER — DEXTROSE 5 % IV SOLN
1.0000 g | Freq: Every day | INTRAVENOUS | Status: DC
  Administered 2015-07-20 (×2): 1 g via INTRAVENOUS
  Filled 2015-07-20 (×3): qty 10

## 2015-07-20 MED ORDER — DEXTROSE 5 % IV SOLN
1.0000 g | INTRAVENOUS | Status: DC
  Filled 2015-07-20: qty 10

## 2015-07-20 MED ORDER — ONDANSETRON HCL 4 MG/2ML IJ SOLN: 4.0000 mg | Freq: Four times a day (QID) | INTRAMUSCULAR | Status: DC | PRN

## 2015-07-20 MED ORDER — CARVEDILOL 12.5 MG PO TABS
12.5000 mg | ORAL_TABLET | Freq: Two times a day (BID) | ORAL | Status: DC
  Administered 2015-07-20 – 2015-07-23 (×6): 12.5 mg via ORAL
  Filled 2015-07-20 (×7): qty 1

## 2015-07-20 MED ORDER — AMLODIPINE BESYLATE 10 MG PO TABS
10.0000 mg | ORAL_TABLET | Freq: Every day | ORAL | Status: DC
  Administered 2015-07-22 – 2015-07-23 (×2): 10 mg via ORAL
  Filled 2015-07-20 (×4): qty 1

## 2015-07-20 MED ORDER — HYDRALAZINE HCL 50 MG PO TABS
100.0000 mg | ORAL_TABLET | Freq: Three times a day (TID) | ORAL | Status: DC
  Administered 2015-07-20 – 2015-07-21 (×3): 100 mg via ORAL
  Filled 2015-07-20 (×4): qty 2

## 2015-07-20 MED ORDER — ASPIRIN 325 MG PO TABS
325.0000 mg | ORAL_TABLET | Freq: Every day | ORAL | Status: DC
  Administered 2015-07-21 – 2015-07-23 (×3): 325 mg via ORAL
  Filled 2015-07-20 (×4): qty 1

## 2015-07-20 MED ORDER — FUROSEMIDE 80 MG PO TABS
80.0000 mg | ORAL_TABLET | Freq: Every day | ORAL | Status: DC
  Administered 2015-07-21: 80 mg via ORAL
  Filled 2015-07-20 (×2): qty 1

## 2015-07-20 MED ORDER — COLCHICINE 0.6 MG PO TABS
0.6000 mg | ORAL_TABLET | Freq: Every day | ORAL | Status: DC
Start: 2015-07-20 — End: 2015-07-21
  Administered 2015-07-21: 0.6 mg via ORAL
  Filled 2015-07-20 (×2): qty 1

## 2015-07-20 MED ORDER — INSULIN ASPART 100 UNIT/ML ~~LOC~~ SOLN
0.0000 [IU] | Freq: Every day | SUBCUTANEOUS | Status: DC
  Administered 2015-07-20: 2 [IU] via SUBCUTANEOUS

## 2015-07-20 NOTE — Care Management Note (Signed)
Case Management Note  Patient Details  Name: TOMISLAV MICALE MRN: [id number] Date of Birth: [birth year]  Subjective/Objective:      Date:  07/20/15 Spoke with patient at the bedside along with wife and daughter, April. Introduced self as Sports coach and explained role in discharge planning and how to be reached. Verified patient lives in town, alone with spouse and daughter. Expressed potential no need for no other DME. Verified patient anticipates to go home with family,at time of discharge and will have full-time  supervision by family  at this time to best of their knowledge. Patient  denied needing help with their medication. Patient drives  to MD appointments. Verified patient has PCP [name].   Plan: CM will continue to follow for discharge planning and [facility] resources.               Action/Plan:   Expected Discharge Date:                  Expected Discharge Plan:  Home w Home Health Services  In-House Referral:     Discharge planning Services  CM Consult  Post Acute Care Choice:    Choice offered to:     DME Arranged:    DME Agency:     HH Arranged:    HH Agency:     Status of Service:  In process, will continue to follow  Medicare Important Message Given:    Date Medicare IM Given:    Medicare IM give by:    Date Additional Medicare IM Given:    Additional Medicare Important Message give by:     If discussed at Long Length of Stay Meetings, dates discussed:    Additional Comments:  Leone Haven, RN 07/20/2015, 10:59 AM

## 2015-07-20 NOTE — ED Notes (Signed)
MD at bedside. 

## 2015-07-20 NOTE — ED Notes (Signed)
Pt informed of need for urine sample; pt verbalized understanding 

## 2015-07-20 NOTE — Progress Notes (Signed)
16 F foley cath insertion per MD order. Sterile procedure followed. 420 ml cloudy yellow urine returned into drainage bag. Secured with stat lock. Tolerating procedure well. Will continue to monitor.

## 2015-07-20 NOTE — Progress Notes (Signed)
Initial Nutrition Assessment  DOCUMENTATION CODES:   Non-severe (moderate) malnutrition in context of chronic illness  INTERVENTION:  - RD team to continue to monitor patient for needs.  - Recommend renal Multi-vitamin.  - Recommend snacks for patient to help meet energy needs. RD student will order in [employer].    NUTRITION DIAGNOSIS:   Unintentional weight loss related to chronic illness, other (see comment) (changes in food patterns) as evidenced by percent weight loss, mild depletion of muscle mass.    GOAL:   Patient will meet greater than or equal to 90% of their needs    MONITOR:   PO intake, Diet advancement, Labs, Supplement acceptance, Weight trends, Skin, I & O's  REASON FOR ASSESSMENT:   Malnutrition Screening Tool    ASSESSMENT:   79 y.o. male with altered mental status, and L.sided facial droop and chest pain. Prior medical history includes: CAD, THN, hyperlipedemia, HCC, CHF, DM, CKD IV-V, Gout, Hyperthyroidism.   Patient alert, and communicative during visit for MST. Wife and daughter were present in room and helped give history. Patient's BMI is 24.1 (normal). Patient has experienced recent weight loss of 6.5%/ 1 month (significant). Meal completion in chart currently is 50%. Patient diet was changed to NPO and he is hungry.   Per patient and family, patient usually has a good appetite. He typically eats a diverse diet of foods cooked by his wife. Including: gravy & biscuits, beans, black-eyed peas, mac-n-cheese, green beans, stew beef, rice, mashed potatoes, roast. Drinks include: coffee in the morning, occasional sodas when blood sugars are low, and pt usually drinks water throughout the day. Patients favorite snacks are nabbs peanut-butter crackers, oatmeal cookies, and orange juice occasionally. Patient stated that eating beans / black eyed peas can cause a gout flair-up.   Patient attributed recent weight loss due to changes in meal patterns after  retirement. 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 After retirement, he still remains fairly active, however he does not snack the way he used to.   Per chart, patient failed a bedside swallow evaluation (07/19/2015), with some evidence of pocketing foods and swallowing difficulty. Diet had been advanced to heart healthy diet, however, now NPO.Patient denies any previous problems with swallowing difficulty or feeding. At time of assessment, speech consult was pending. Per chart, speech recommended patient for regular diet with no evidence of swallowing difficulties.   NPFE results showed mild muscle wasting (clavicle, temple, thighs, knees).   Labs reviewed: high BUN (91), high Cr (4.57), high troponin (0.05), low pCO2 (33.2), low P02 (71.0), high aPTT (54).   Medications reviewed: lasix, novoLOG  Diet Order:  Diet Heart Room service appropriate?: Yes; Fluid consistency:: Thin  Skin:  Reviewed, no issues  Last BM:  07/19/2015  Height:   Ht Readings from Last 1 Encounters:  07/20/15 5' 10.8" (1.798 m)    Weight:   Wt Readings from Last 1 Encounters:  07/20/15 171 lb 12.8 oz (77.928 kg)    Ideal Body Weight:  70 kg  BMI:  Body mass index is 24.11 kg/(m^2).  Estimated Nutritional Needs:   Kcal:  1600-1800 kcal  Protein:  65-75 g  Fluid:  >1.5 L/day  EDUCATION NEEDS:   No education needs identified at this time  Delano Metz, [facility] Dietetic Intern 07/20/2015 2:37 PM

## 2015-07-20 NOTE — Progress Notes (Signed)
[facility] PROGRESS NOTE  [name] [name] JYN:[id number] DOB: 1936-08-12 DOA: 07/19/2015 PCP: [name], MD  Assessment/Plan: 1. Acute encephalopathy 1. Clinically much improved overnight 2. MRI neg for stroke 3. Suspect toxic-metabolic encephalopahty 2. Fever 1. CXR 2. Blood cultures pending 3. Urine cx unremarkable 3. Acute on chronic kidney disease 1. Bladder US with 330 cc in ED, repeat today of 360cc 2. Will order renal US 3. For now, place foley cath and start flomax 4. HTN 1. BP controlled 5. Hypothyroid 1. Cont thyroid replacement as tolerated 6. HLD 1. Cont statin as tolerated 7. Chronic systolic and diastolic CHF 1. Pt clinically appears euvolemic 2. Cont lasix as tolerated 8. Bladder outlet obstruction 1. Bladder US with 360cc urine and flomax started 2. Consider voiding trial in 1-2 days 9. DVT prophylaxis 1. Heparin subQ   Code Status: Full Family Communication: Pt in room, family at bedside (indicate person spoken with, relationship, and if by phone, the number) Disposition Plan: Pending   Consultants:    Procedures:    Antibiotics: Anti-infectives    Start     Dose/Rate Route Frequency Ordered Stop   07/20/15 0200  cefTRIAXone (ROCEPHIN) 1 g in dextrose 5 % 50 mL IVPB     1 g 100 mL/hr over 30 Minutes Intravenous Daily at bedtime 07/20/15 0138     07/20/15 0145  cefTRIAXone (ROCEPHIN) 1 g in dextrose 5 % 50 mL IVPB  Status:  Discontinued     1 g 100 mL/hr over 30 Minutes Intravenous Every 24 hours 07/20/15 0131 07/20/15 0138      HPI/Subjective: Feels better  Objective: Filed Vitals:   07/20/15 0144 07/20/15 0512 07/20/15 0917 07/20/15 1349  BP: 146/66 138/57 133/56 136/60  Pulse: 81 83 88 80  Temp: 99.3 F (37.4 C) 98.7 F (37.1 C)  99.3 F (37.4 C)  TempSrc: Oral Oral  Oral  Resp: 24 22  18   Height: 5' 10.8" (1.798 m)     Weight: 77.928 kg (171 lb 12.8 oz)     SpO2: 98% 97%  88%    Intake/Output Summary (Last 24  hours) at 07/20/15 1606 Last data filed at 07/20/15 1352  Gross per 24 hour  Intake    770 ml  Output    260 ml  Net    510 ml   Filed Weights   07/19/15 1932 07/20/15 0144  Weight: 84.006 kg (185 lb 3.2 oz) 77.928 kg (171 lb 12.8 oz)    Exam:   General:  Awake, in nad  Cardiovascular: regular, s1, s2  Respiratory: normal resp effort, no wheezing  Abdomen: soft,nondistended  Musculoskeletal: perfused, no clubbing   Data Reviewed: Basic Metabolic Panel:  Recent Labs Lab 07/19/15 1943 07/20/15 0211  NA 135  138 137  K 4.5  4.5 4.3  CL 103  105 105  CO2 21* 21*  GLUCOSE 169*  175* 140*  BUN 88*  86* 91*  CREATININE 4.70*  4.60* 4.57*  CALCIUM 9.4 9.4   Liver Function Tests:  Recent Labs Lab 07/19/15 1943  AST 18  ALT 10*  ALKPHOS 35*  BILITOT 0.7  PROT 7.5  ALBUMIN 3.8   No results for input(s): LIPASE, AMYLASE in the last 168 hours. No results for input(s): AMMONIA in the last 168 hours. CBC:  Recent Labs Lab 07/19/15 1943 07/20/15 0211  WBC 8.3 7.7  NEUTROABS 6.2  --   HGB 12.2*  10.8* 10.5*  HCT 36.0*  33.8* 33.3*  MCV 93.6 93.8  PLT 143* 138*   Cardiac Enzymes:  Recent Labs Lab 07/20/15 0211  TROPONINI 0.05*   BNP (last 3 results)  Recent Labs  11/23/14 1532 12/10/14 0530  BNP 592.3* 326.5*    ProBNP (last 3 results)  Recent Labs  09/03/14 1337  PROBNP 125.0*    CBG:  Recent Labs Lab 07/20/15 0153 07/20/15 0753 07/20/15 1136  GLUCAP 140* 107* 96    Recent Results (from the past 240 hour(s))  MRSA PCR Screening     Status: None   Collection Time: 07/20/15  1:58 AM  Result Value Ref Range Status   MRSA by PCR NEGATIVE NEGATIVE Final    Comment:        The GeneXpert MRSA Assay (FDA approved for NASAL specimens only), is one component of a comprehensive MRSA colonization surveillance program. It is not intended to diagnose MRSA infection nor to guide or monitor treatment for MRSA infections.       Studies: Ct Head Wo Contrast  07/19/2015   CLINICAL DATA:  Confusion with weakness and gait disturbance today. Left facial droop. Initial encounter.  EXAM: CT HEAD WITHOUT CONTRAST  TECHNIQUE: Contiguous axial images were obtained from the base of the skull through the vertex without intravenous contrast.  COMPARISON:  Head CT 12/12/2010.  MRI brain 12/13/2010.  FINDINGS: There is no evidence of acute intracranial hemorrhage, mass lesion, brain edema or extra-axial fluid collection. Diffuse dural calcifications and mild symmetric thickening of the tentorium are similar to the prior study. There is no evidence of hydrocephalus or cortical based infarct. Mild chronic small vessel ischemic changes are stable. Intracranial vascular calcifications noted.  The visualized paranasal sinuses, mastoid air cells and middle ears are clear. The calvarium is intact.  IMPRESSION: Stable examination.  No acute intracranial findings.   Electronically Signed   By: Carey Bullocks M.D.   On: 07/19/2015 21:26   Mr Brain Wo Contrast  07/20/2015   CLINICAL DATA:  Initial evaluation for acute left facial droop  EXAM: MRI HEAD WITHOUT CONTRAST  TECHNIQUE: Multiplanar, multiecho pulse sequences of the brain and surrounding structures were obtained without intravenous contrast.  COMPARISON:  Prior CT from 07/19/2015.  FINDINGS: No abnormal foci of restricted diffusion to suggest acute intracranial infarct. Gray-white matter differentiation maintained. Normal intravascular flow voids are preserved. No acute or chronic intracranial hemorrhage.  Mild diffuse prominence of the CSF containing spaces is compatible with generalized cerebral atrophy. Patchy T2/FLAIR hyperintensity within the periventricular and deep white matter most consistent with chronic small vessel ischemic disease.  No mass lesion, midline shift or mass effect. No hydrocephalus. No extra-axial fluid collection.  Craniocervical junction within normal limits. Pituitary  gland unremarkable.  No acute abnormality about the orbits.  Mild scattered mucosal thickening within the ethmoidal air cells. Paranasal sinuses are otherwise clear. Minimal opacity within the inferior left mastoid air cells. Inner ear structures grossly normal.  Bone marrow signal intensity within normal limits. Scalp soft tissues unremarkable.  IMPRESSION: 1. No acute intracranial infarct or other process identified. 2. Mild atrophy with chronic small vessel ischemic disease.   Electronically Signed   By: Rise Mu M.D.   On: 07/20/2015 05:49   Dg Chest Port 1 View  07/20/2015   CLINICAL DATA:  Fever.  EXAM: PORTABLE CHEST 1 VIEW  COMPARISON:  12/10/2014  FINDINGS: Cardiac silhouette remains enlarged. Thoracic aortic calcification is noted. There is a small amount of thickening or fluid in the right minor fissure, decreased from the prior study. Pulmonary vascular  congestion and mild interstitial densities bilaterally are stable to slightly improved from the prior study. There is improved aeration of the right lung base. No segmental airspace consolidation or pneumothorax is seen. No acute osseous abnormality is identified.  IMPRESSION: Cardiomegaly with mild pulmonary vascular congestion and mild interstitial edema, stable to slightly improved from the prior study. Likely small volume pleural fluid in the minor fissure.   Electronically Signed   By: Sebastian Ache M.D.   On: 07/20/2015 14:50    Scheduled Meds: . amLODipine  10 mg Oral Daily  . antiseptic oral rinse  7 mL Mouth Rinse BID  . aspirin  325 mg Oral Daily  . carvedilol  12.5 mg Oral BID WC  . cefTRIAXone (ROCEPHIN)  IV  1 g Intravenous QHS  . colchicine  0.6 mg Oral Daily  . furosemide  80 mg Oral Daily  . heparin  5,000 Units Subcutaneous 3 times per day  . hydrALAZINE  100 mg Oral 3 times per day  . insulin aspart  0-15 Units Subcutaneous TID WC  . insulin aspart  0-5 Units Subcutaneous QHS  . isosorbide mononitrate  60 mg  Oral Daily  . levothyroxine  200 mcg Oral QAC breakfast  . olopatadine  1 drop Both Eyes BID  . rosuvastatin  20 mg Oral Daily  . tamsulosin  0.4 mg Oral Daily   Continuous Infusions:   Active Problems:   Hypothyroidism   Type II diabetes mellitus with renal manifestations (HCC)   Hyperlipidemia   Essential hypertension   Chest pain   Altered mental state   Chronic combined systolic and diastolic CHF (congestive heart failure) (HCC)   Renal insufficiency   Diabetes mellitus with complication (HCC)   Malnutrition of moderate degree   Neema Barreira K  [facility] Pager [phone]. If 7PM-7AM, please contact night-coverage at [email or web address], password [facility] 07/20/2015, 4:06 PM  LOS: 1 day

## 2015-07-20 NOTE — H&P (Addendum)
[facility] History and Physical  [name] [name] ZOX:[id number] DOB: 1935/11/17 DOA: 07/19/2015  Referring physician: Dr Cristi Loron - [facility] PCP: [name], MD   Chief Complaint: L sided facial drrop and AMS  HPI: [name] [name] is a 79 y.o. male   Level V caveat: Patient unable to give reliable review of systems or history of present illness this time given his altered mental status. Patient able answer limited questions. Multiple family members present were able to give more detailed history as well as history provided by ED physician. Patient has been in his normal state of health and lives with his daughter. Patient was last seen normal at 10 AM on day of admission. Family returned home at 6 PM finding the patient lethargic, confused, with left facial droop and weakness in bilateral lower extremity weakness. Patient unable to stand. Multiple family members were called and patient was placed in a vehicle brought to the emergency room for further evaluation. Symptoms are constant and nothing makes them better or worse. Nothing was given to try and improve symptoms.patient does complain of occasional chest pain, but is unable to further describe symptoms.   Review of Systems:  Pt presenting with Altered mental status and unable to give her further review of systems.  Past Medical History  Diagnosis Date  . CAD (coronary artery disease)     a. s/p PCI to mid and mid-distal RCA, 40% sten L main, no sig dz LCx  b. myoview 11/24/2014 inferoapex ischemia, medical management due to high risk for contrast nephropathy  . HTN (hypertension)   . Hyperlipidemia   . Cerebrovascular accident (HCC)   . CHF (congestive heart failure) (HCC)     EF 35% echo 2012 ; EF 44% myoview 2012; EF 76% by [facility] November 2014  . Diabetes mellitus   . 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 Unspecified hypothyroidism   . Gout    Past Surgical History  Procedure Laterality Date  . Thyroidectomy      Social History:  reports that he quit smoking about 61 years ago. His smoking use included Cigarettes. He smoked 0.50 packs per day. He does not have any smokeless tobacco history on file. He reports that he does not drink alcohol or use illicit drugs.  Allergies  Allergen Reactions  . Tomato     Too much acid    Family History  Problem Relation Age of Onset  . Kidney failure Mother   . Diabetes Mother   . Cancer Father     lung  . Heart attack Brother     massive heart attack in 66s  . Hypertension Mother      Prior to Admission medications   Medication Sig Start Date End Date Taking? Authorizing Provider  acetaminophen (TYLENOL) 500 MG tablet Take 500 mg by mouth 2 (two) times daily.    Yes Historical Provider, MD  amLODipine (NORVASC) 10 MG tablet Take 10 mg by mouth daily. 04/30/15  Yes Historical Provider, MD  aspirin 325 MG tablet Take 325 mg by mouth daily.     Yes Historical Provider, MD  calcitRIOL (ROCALTROL) 0.25 MCG capsule Take 0.25 mcg by mouth daily.    Yes Historical Provider, MD  colchicine 0.6 MG tablet Take 0.5 tablets (0.3 mg total) by mouth daily. Patient taking differently: Take 0.6 mg by mouth daily.  09/06/13  Yes Ripudeep Jenna Luo, MD  furosemide (LASIX) 40 MG tablet TAKE 2 TABLETS (80 MG  TOTAL) BY MOUTH DAILY. 07/19/15  Yes [name], MD  glipiZIDE (GLUCOTROL) 5 MG tablet Take 5 mg by mouth daily. 05/01/15  Yes Historical Provider, MD  glyBURIDE (DIABETA) 5 MG tablet Take 1 tablet (5 mg total) by mouth daily with breakfast. Take a 1/2 pill before breakfast and a whole pill before 2nd meal of the day. Patient taking differently: Take 2.5-5 mg by mouth 2 (two) times daily with a meal. Take a 1/2 pill before breakfast and a whole pill before 2nd meal of the day. 04/20/15  Yes [name], MD  hydrALAZINE (APRESOLINE) 50 MG tablet Take 1.5 tablets (75 mg total) by mouth 3 (three) times daily. Patient taking differently: Take 100 mg by mouth 3 (three)  times daily.  12/15/14  Yes Laurey Morale, MD  isosorbide mononitrate (IMDUR) 60 MG 24 hr tablet Take 1 tablet (60 mg total) by mouth daily. 02/15/15  Yes [name], MD  levothyroxine (SYNTHROID, LEVOTHROID) 200 MCG tablet Take 200 mcg by mouth daily. 03/25/15  Yes Historical Provider, MD  nitroGLYCERIN (NITROSTAT) 0.4 MG SL tablet Place 1 tablet (0.4 mg total) under the tongue every 5 (five) minutes as needed for chest pain. 11/25/14 04/25/17 Yes Azalee Course, PA  olopatadine (PATANOL) 0.1 % ophthalmic solution Place 1 drop into both eyes 2 (two) times daily. 04/20/15  Yes [name], MD  rosuvastatin (CRESTOR) 20 MG tablet Take 1 tablet (20 mg total) by mouth daily. 03/12/15  Yes [name], MD  carvedilol (COREG) 12.5 MG tablet Take 1 tablet (12.5 mg total) by mouth 2 (two) times daily with a meal. 01/11/15   [name] Patty, MD  chlorzoxazone (PARAFON) 500 MG tablet Take 1 tablet (500 mg total) by mouth 3 (three) times daily as needed for muscle spasms. Patient not taking: Reported on 01/19/2015 09/18/14   [name], MD  [facility] Liver Oil OIL Take 15 mLs by mouth daily.    Historical Provider, MD  HYDROcodone-acetaminophen (NORCO/VICODIN) 5-325 MG per tablet Take 1 tablet by mouth every 6 (six) hours as needed for moderate pain. Patient not taking: Reported on 07/19/2015 04/20/15   [name], MD   Physical Exam: Filed Vitals:   07/19/15 2330 07/20/15 0000 07/20/15 0015 07/20/15 0100  BP: 127/59 136/56 124/66 142/65  Pulse: 71 74 69 87  Temp:      TempSrc:      Resp: Weight:      SpO2: 94% 94% 90% 94%    Wt Readings from Last 3 Encounters:  07/19/15 84.006 kg (185 lb 3.2 oz)  06/22/15 83.235 kg (183 lb 8 oz)  04/20/15 80.74 kg (178 lb)    General: sleeping comfortably but easily arousable. Eyes: PERRL, normal lids, irises & conjunctiva ENT: dry mucous membranes Neck:  no LAD, masses or thyromegaly Cardiovascular:  RRR, no m/r/g. No LE  edema. Respiratory:  CTA bilaterally, no w/r/r. Normal respiratory effort. Abdomen:  soft, ntnd Skin:  no rash or induration seen on limited exam Musculoskeletal:  Bilateral lower extremity weakness. Psychiatric:patient minimally participatory in exam, alert and oriented 0, follows basic commands, answers few questions with simple answers. Neurologic: Cranial nerves II through XII intact, no dysmetria bilaterally, moves all extremity coordinated fashion.          Labs on Admission:  Basic Metabolic Panel:  Recent Labs Lab 07/19/15 1943  NA 135  138  K 4.5  4.5  CL 103  105  CO2 21*  GLUCOSE 169*  175*  BUN 88*  86*  CREATININE 4.70*  4.60*  CALCIUM 9.4   Liver Function Tests:  Recent Labs Lab 07/19/15 1943  AST 18  ALT 10*  ALKPHOS 35*  BILITOT 0.7  PROT 7.5  ALBUMIN 3.8   No results for input(s): LIPASE, AMYLASE in the last 168 hours. No results for input(s): AMMONIA in the last 168 hours. CBC:  Recent Labs Lab 07/19/15 1943  WBC 8.3  NEUTROABS 6.2  HGB 12.2*  10.8*  HCT 36.0*  33.8*  MCV 93.6  PLT 143*   Cardiac Enzymes: No results for input(s): CKTOTAL, CKMB, CKMBINDEX, TROPONINI in the last 168 hours.  BNP (last 3 results)  Recent Labs  11/23/14 1532 12/10/14 0530  BNP 592.3* 326.5*    ProBNP (last 3 results)  Recent Labs  09/03/14 1337  PROBNP 125.0*    CBG: No results for input(s): GLUCAP in the last 168 hours.  Radiological Exams on Admission: Ct Head Wo Contrast  07/19/2015   CLINICAL DATA:  Confusion with weakness and gait disturbance today. Left facial droop. Initial encounter.  EXAM: CT HEAD WITHOUT CONTRAST  TECHNIQUE: Contiguous axial images were obtained from the base of the skull through the vertex without intravenous contrast.  COMPARISON:  Head CT 12/12/2010.  MRI brain 12/13/2010.  FINDINGS: There is no evidence of acute intracranial hemorrhage, mass lesion, brain edema or extra-axial fluid collection. Diffuse  dural calcifications and mild symmetric thickening of the tentorium are similar to the prior study. There is no evidence of hydrocephalus or cortical based infarct. Mild chronic small vessel ischemic changes are stable. Intracranial vascular calcifications noted.  The visualized paranasal sinuses, mastoid air cells and middle ears are clear. The calvarium is intact.  IMPRESSION: Stable examination.  No acute intracranial findings.   Electronically Signed   By: Carey Bullocks M.D.   On: 07/19/2015 21:26     Assessment/Plan Active Problems:   Hypothyroidism   Type II diabetes mellitus with renal manifestations (HCC)   Hyperlipidemia   Essential hypertension   Chest pain   Altered mental state   Chronic combined systolic and diastolic CHF (congestive heart failure) (HCC)   Renal insufficiency   Acute encephalopathy: infectious versus respiratory. Patient with temperature to 100.2, WBC 8.3, tachypneic, hypoxemic, urinary retention of 330 and no sensation to need to void. Cr 4.6. History of stroke with no residual right-sided weakness.per ED physician, patient's mental status is improving after O2 therapy in the ED. Patient with history of sleep apnea but does not wear oxygen. - Tele - ABG - Will start CTX empirically for possible UTI - lactic acid - UA, UCX - IVF - MRI pending - f/u Neuro recs - Neuro checks  AoCKD: Cr 4.7, baseline 3.6.  - IVF - BMET in am  HTN: normotensive - continue norvasc, Imdur, coreg, hydralazine,   Chest pain: psychosomatic vs cardiac vs MSK. EKG w/o ACS (unchanged from previous) -  trop - EKG in am  Hypothyroid - continue synthroid  DM: - SSi  Chronic pain - restart pain medications when mental status improves  HLD: - continue crestor  Chronic systolic and diastolic congestive heart failure: Last echo in February 2016 showing EF of 40-45% and grade 2 diastolic dysfunction. No evidence of decompensation. - continue lasix, bblocker  Code  Status: FULL  DVT Prophylaxis: Hep Family Communication: Daughter, son, daughter in law Disposition Plan: pending improvement     Toren Tucholski Shela Commons, MD [facility] [email or web address] Password [email or web address]

## 2015-07-20 NOTE — Therapy (Signed)
SLP Cancellation Note  Unable to complete SLP evaluation at this time, as pt is currently off unit for testing. Will continue efforts.  Gennette Shadix B. Necha Harries, MSP, CCC-SLP [phone]] [phone]

## 2015-07-20 NOTE — Evaluation (Signed)
Clinical/Bedside Swallow Evaluation Patient Details  Name: BLAYDEN CONWELL MRN: [id number] Date of Birth: 06-15-1936  Today's Date: 07/20/2015 Time: SLP Start Time (ACUTE ONLY): 1450 SLP Stop Time (ACUTE ONLY): 1505 SLP Time Calculation (min) (ACUTE ONLY): 15 min  Past Medical History:  Past Medical History  Diagnosis Date  . CAD (coronary artery disease)     a. s/p PCI to mid and mid-distal RCA, 40% sten L main, no sig dz LCx  b. myoview 11/24/2014 inferoapex ischemia, medical management due to high risk for contrast nephropathy  . HTN (hypertension)   . Hyperlipidemia   . Cerebrovascular accident (HCC)   . CHF (congestive heart failure) (HCC)     EF 35% echo 2012 ; EF 44% myoview 2012; EF 76% by [facility] November 2014  . Diabetes mellitus   . 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 Unspecified hypothyroidism   . Gout    Past Surgical History:  Past Surgical History  Procedure Laterality Date  . Thyroidectomy     HPI:  MRI negative for acute findings.    Assessment / Plan / Recommendation Clinical Impression  Pt exhibits normal oral motor strength and function. No overt s/s aspiration. No history of swallowing difficulty prior to admit. Pocketed material was beans left over from the meal pt was eating when he left to come to the ED. Recommend regular diet and thin liquids. No follow up recommended at this time. Please reconsult if needs arise.    Aspiration Risk  Mild    Diet Recommendation Age appropriate regular solids;Thin   Medication Administration: Whole meds with liquid Compensations: Slow rate;Small sips/bites    Other  Recommendations Oral Care Recommendations: Oral care BID                 Pertinent Vitals/Pain No pain reported    SLP Swallow Goals  n/a   Swallow Study Prior Functional Status   No history of swallowing difficulty per pt/family    General Date of Onset: 07/19/15 Other Pertinent Information: MRI negative for acute findings.   Type of Study: Bedside swallow evaluation Previous Swallow Assessment: none found Diet Prior to this Study: NPO Temperature Spikes Noted: No Respiratory Status: Supplemental O2 delivered via (comment) ([place]) History of Recent Intubation: No Behavior/Cognition: Alert;Pleasant mood;Cooperative Oral Cavity - Dentition: Edentulous Self-Feeding Abilities: Able to feed self Patient Positioning: Upright in bed Baseline Vocal Quality: Normal Volitional Cough: Strong Volitional Swallow: Able to elicit    Oral/Motor/Sensory Function Overall Oral Motor/Sensory Function: Appears within functional limits for tasks assessed   Ice Chips Ice chips: Not tested   Thin Liquid Thin Liquid: Within functional limits Presentation: Straw    Nectar Thick Nectar Thick Liquid: Not tested   Honey Thick Honey Thick Liquid: Not tested   Puree Puree: Within functional limits Presentation: Self Fed;Spoon   Solid   GO    Solid: Within functional limits Presentation: Self Fed       [place], Celia Brown 07/20/2015,3:12 PM   Harlow Asa. Murvin [facility], CCC-SLP [phone]

## 2015-07-21 ENCOUNTER — Ambulatory Visit: Payer: Medicare Other | Admitting: Family Medicine

## 2015-07-21 DIAGNOSIS — I10 Essential (primary) hypertension: Secondary | ICD-10-CM

## 2015-07-21 DIAGNOSIS — J9600 Acute respiratory failure, unspecified whether with hypoxia or hypercapnia: Secondary | ICD-10-CM

## 2015-07-21 DIAGNOSIS — M109 Gout, unspecified: Secondary | ICD-10-CM

## 2015-07-21 DIAGNOSIS — N189 Chronic kidney disease, unspecified: Secondary | ICD-10-CM

## 2015-07-21 DIAGNOSIS — E440 Moderate protein-calorie malnutrition: Secondary | ICD-10-CM

## 2015-07-21 DIAGNOSIS — G9340 Encephalopathy, unspecified: Principal | ICD-10-CM

## 2015-07-21 DIAGNOSIS — I5041 Acute combined systolic (congestive) and diastolic (congestive) heart failure: Secondary | ICD-10-CM

## 2015-07-21 DIAGNOSIS — N179 Acute kidney failure, unspecified: Secondary | ICD-10-CM

## 2015-07-21 DIAGNOSIS — E118 Type 2 diabetes mellitus with unspecified complications: Secondary | ICD-10-CM

## 2015-07-21 DIAGNOSIS — J9601 Acute respiratory failure with hypoxia: Secondary | ICD-10-CM

## 2015-07-21 DIAGNOSIS — I480 Paroxysmal atrial fibrillation: Secondary | ICD-10-CM

## 2015-07-21 HISTORY — DX: Acute respiratory failure, unspecified whether with hypoxia or hypercapnia: J96.00

## 2015-07-21 LAB — BASIC METABOLIC PANEL
ANION GAP: 14 (ref 5–15)
BUN: 96 mg/dL — ABNORMAL HIGH (ref 6–20)
CHLORIDE: 105 mmol/L (ref 101–111)
CO2: 20 mmol/L — AB (ref 22–32)
CREATININE: 4.71 mg/dL — AB (ref 0.61–1.24)
Calcium: 9.3 mg/dL (ref 8.9–10.3)
GFR calc non Af Amer: 11 mL/min — ABNORMAL LOW (ref >60)
GFR, EST AFRICAN AMERICAN: 13 mL/min — AB (ref >60)
Glucose, Bld: 147 mg/dL — ABNORMAL HIGH (ref 65–99)
Potassium: 4.9 mmol/L (ref 3.5–5.1)
Sodium: 139 mmol/L (ref 135–145)

## 2015-07-21 LAB — URINE CULTURE

## 2015-07-21 LAB — HEMOGLOBIN A1C
Hgb A1c MFr Bld: 6.4 % — ABNORMAL HIGH (ref 4.8–5.6)
MEAN PLASMA GLUCOSE: 137 mg/dL

## 2015-07-21 LAB — CBC
HCT: 31.4 % — ABNORMAL LOW (ref 39.0–52.0)
HEMOGLOBIN: 10.2 g/dL — AB (ref 13.0–17.0)
MCH: 30.9 pg (ref 26.0–34.0)
MCHC: 32.5 g/dL (ref 30.0–36.0)
MCV: 95.2 fL (ref 78.0–100.0)
Platelets: 138 10*3/uL — ABNORMAL LOW (ref 150–400)
RBC: 3.3 MIL/uL — AB (ref 4.22–5.81)
RDW: 12.8 % (ref 11.5–15.5)
WBC: 6.8 10*3/uL (ref 4.0–10.5)

## 2015-07-21 LAB — INFLUENZA PANEL BY PCR (TYPE A & B)
H1N1FLUPCR: NOT DETECTED
INFLBPCR: NEGATIVE
Influenza A By PCR: NEGATIVE

## 2015-07-21 LAB — GLUCOSE, CAPILLARY
Glucose-Capillary: 147 mg/dL — ABNORMAL HIGH (ref 65–99)
Glucose-Capillary: 176 mg/dL — ABNORMAL HIGH (ref 65–99)
Glucose-Capillary: 164 mg/dL — ABNORMAL HIGH (ref 65–99)
Glucose-Capillary: 160 mg/dL — ABNORMAL HIGH (ref 65–99)

## 2015-07-21 LAB — TSH: TSH: 0.111 u[IU]/mL — AB (ref 0.350–4.500)

## 2015-07-21 LAB — T4, FREE: FREE T4: 1.24 ng/dL — AB (ref 0.61–1.12)

## 2015-07-21 MED ORDER — COLCHICINE 0.6 MG PO TABS
0.3000 mg | ORAL_TABLET | Freq: Every day | ORAL | Status: DC
  Administered 2015-07-22 – 2015-07-23 (×2): 0.3 mg via ORAL
  Filled 2015-07-21 (×2): qty 1

## 2015-07-21 MED ORDER — HYDRALAZINE HCL 25 MG PO TABS: 25.0000 mg | ORAL_TABLET | Freq: Three times a day (TID) | ORAL | Status: DC

## 2015-07-21 MED ORDER — ENOXAPARIN SODIUM 80 MG/0.8ML ~~LOC~~ SOLN
1.0000 mg/kg | SUBCUTANEOUS | Status: DC
  Administered 2015-07-21 – 2015-07-22 (×2): 80 mg via SUBCUTANEOUS
  Filled 2015-07-21 (×2): qty 0.8

## 2015-07-21 MED ORDER — ACETAMINOPHEN 325 MG PO TABS
650.0000 mg | ORAL_TABLET | ORAL | Status: AC | PRN
  Administered 2015-07-21 – 2015-07-22 (×2): 650 mg via ORAL
  Filled 2015-07-21 (×2): qty 2

## 2015-07-21 MED ORDER — FUROSEMIDE 10 MG/ML IJ SOLN
80.0000 mg | Freq: Two times a day (BID) | INTRAMUSCULAR | Status: DC
  Administered 2015-07-21: 80 mg via INTRAVENOUS
  Filled 2015-07-21 (×2): qty 8

## 2015-07-21 MED ORDER — FUROSEMIDE 10 MG/ML IJ SOLN
120.0000 mg | Freq: Two times a day (BID) | INTRAMUSCULAR | Status: DC
  Administered 2015-07-21 – 2015-07-23 (×4): 120 mg via INTRAVENOUS
  Filled 2015-07-21 (×6): qty 12

## 2015-07-21 MED ORDER — HYDRALAZINE HCL 10 MG PO TABS: 10.0000 mg | ORAL_TABLET | Freq: Three times a day (TID) | ORAL | Status: DC

## 2015-07-21 MED ORDER — SODIUM CHLORIDE 0.9 % IV BOLUS (SEPSIS)
250.0000 mL | Freq: Once | INTRAVENOUS | Status: AC
  Administered 2015-07-21: 250 mL via INTRAVENOUS

## 2015-07-21 NOTE — Progress Notes (Signed)
ANTICOAGULATION CONSULT NOTE - Initial Consult  Pharmacy Consult for Lovenox Indication: atrial fibrillation  Allergies  Allergen Reactions  . Tomato     Too much acid    Patient Measurements: Height: 5' 10.8" (179.8 cm) Weight: 171 lb 12.8 oz (77.928 kg) IBW/kg (Calculated) : 74.84 Heparin Dosing Weight:   Vital Signs: Temp: 98.6 F (37 C) (10/05 1327) Temp Source: Oral (10/05 1327) BP: 112/52 mmHg (10/05 1515) Pulse Rate: 74 (10/05 1515)  Labs:  Recent Labs  07/19/15 1943 07/20/15 0211 07/21/15 0628  HGB 12.2*  10.8* 10.5* 10.2*  HCT 36.0*  33.8* 33.3* 31.4*  PLT 143* 138* 138*  APTT 54*  --   --   LABPROT 16.9*  --   --   INR 1.36  --   --   CREATININE 4.70*  4.60* 4.57* 4.71*  TROPONINI  --  0.05*  --     Estimated Creatinine Clearance: 13.9 mL/min (by C-G formula based on Cr of 4.71).   Medical History: Past Medical History  Diagnosis Date  . CAD (coronary artery disease)     a. s/p PCI to mid and mid-distal RCA, 40% sten L main, no sig dz LCx  b. myoview 11/24/2014 inferoapex ischemia, medical management due to high risk for contrast nephropathy  . HTN (hypertension)   . Hyperlipidemia   . Cerebrovascular accident (HCC)   . CHF (congestive heart failure) (HCC)     EF 35% echo 2012 ; EF 44% myoview 2012; EF 76% by [facility] November 2014  . Diabetes mellitus   . 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 Unspecified hypothyroidism   . Gout     Medications:  Prescriptions prior to admission  Medication Sig Dispense Refill Last Dose  . acetaminophen (TYLENOL) 500 MG tablet Take 500 mg by mouth 2 (two) times daily.    07/19/2015 at Unknown time  . amLODipine (NORVASC) 10 MG tablet Take 10 mg by mouth daily.  4 07/19/2015 at Unknown time  . aspirin 325 MG tablet Take 325 mg by mouth daily.     07/19/2015 at Unknown time  . calcitRIOL (ROCALTROL) 0.25 MCG capsule Take 0.25-0.5 mcg by mouth See admin instructions. Pt takes 0.65mcg on odd days and  0.52mcg on even days as directed   07/19/2015 at Unknown time  . carvedilol (COREG) 12.5 MG tablet Take 1 tablet (12.5 mg total) by mouth 2 (two) times daily with a meal. 60 tablet 11 07/19/2015 at 0900  . Cod Liver Oil OIL Take 15 mLs by mouth daily.   Past Month at Unknown time  . colchicine 0.6 MG tablet Take 0.5 tablets (0.3 mg total) by mouth daily. (Patient taking differently: Take 0.6 mg by mouth daily. )   07/19/2015 at Unknown time  . furosemide (LASIX) 40 MG tablet TAKE 2 TABLETS (80 MG TOTAL) BY MOUTH DAILY. 60 tablet 5 07/19/2015 at Unknown time  . glipiZIDE (GLUCOTROL) 5 MG tablet Take 2.5-5 mg by mouth See admin instructions. Pt takes 2.5mg  in am before breakfast and  in pm before dinner  3 07/19/2015 at Unknown time  . glyBURIDE (DIABETA) 5 MG tablet Take 1 tablet (5 mg total) by mouth daily with breakfast. Take a 1/2 pill before breakfast and a whole pill before 2nd meal of the day. (Patient taking differently: Take 2.5-5 mg by mouth 2 (two) times daily with a meal. Take a 1/2 pill before breakfast and a whole pill before 2nd meal of the  day.) 135 tablet 3 07/19/2015 at Unknown time  . hydrALAZINE (APRESOLINE) 50 MG tablet Take 1.5 tablets (75 mg total) by mouth 3 (three) times daily. (Patient taking differently: Take 100 mg by mouth 3 (three) times daily. ) 90 tablet 3 07/19/2015 at Unknown time  . isosorbide mononitrate (IMDUR) 60 MG 24 hr tablet Take 1 tablet (60 mg total) by mouth daily. 30 tablet 6 07/19/2015 at Unknown time  . levothyroxine (SYNTHROID, LEVOTHROID) 200 MCG tablet Take 200 mcg by mouth daily.   07/19/2015 at Unknown time  . nitroGLYCERIN (NITROSTAT) 0.4 MG SL tablet Place 1 tablet (0.4 mg total) under the tongue every 5 (five) minutes as needed for chest pain. 25 tablet 3 unknown  . olopatadine (PATANOL) 0.1 % ophthalmic solution Place 1 drop into both eyes 2 (two) times daily. 5 mL 3 07/19/2015 at Unknown time  . rosuvastatin (CRESTOR) 20 MG tablet Take 1 tablet (20 mg  total) by mouth daily. 90 tablet 2 07/19/2015 at Unknown time  . chlorzoxazone (PARAFON) 500 MG tablet Take 1 tablet (500 mg total) by mouth 3 (three) times daily as needed for muscle spasms. (Patient not taking: Reported on 01/19/2015) 60 tablet 1 Not Taking at Unknown time  . HYDROcodone-acetaminophen (NORCO/VICODIN) 5-325 MG per tablet Take 1 tablet by mouth every 6 (six) hours as needed for moderate pain. (Patient not taking: Reported on 07/19/2015) 60 tablet 0 Not Taking at Unknown time   Scheduled:  . amLODipine  10 mg Oral Daily  . antiseptic oral rinse  7 mL Mouth Rinse BID  . aspirin  325 mg Oral Daily  . carvedilol  12.5 mg Oral BID WC  . [START ON 07/22/2015] colchicine  0.3 mg Oral Daily  . furosemide  120 mg Intravenous Q12H  . heparin  5,000 Units Subcutaneous 3 times per day  . insulin aspart  0-15 Units Subcutaneous TID WC  . insulin aspart  0-5 Units Subcutaneous QHS  . isosorbide mononitrate  60 mg Oral Daily  . levothyroxine  200 mcg Oral QAC breakfast  . olopatadine  1 drop Both Eyes BID  . rosuvastatin  20 mg Oral Daily  . tamsulosin  0.4 mg Oral Daily   Infusions:    Assessment: 79yo male with history of CAD, HTN HLD, CHF, DM2, CKD4 and hypothyroid presents with AMS. Pharmacy is consulted to dose lovenox for new onset atrial fibrillation. Hgb 10.2, Plt 138, sCr 4.7.  Goal of Therapy:  Prevention of clot Monitor platelets by anticoagulation protocol: Yes   Plan:  Lovenox /kg subcutaneously q24h Daily CBC Monitor s/sx of bleeding F/u long-term anticoag plans  Arlean Hopping. Newman Pies, PharmD Clinical Pharmacist Pager [phone] 07/21/2015,4:34 PM

## 2015-07-21 NOTE — Progress Notes (Addendum)
[facility] Progress Note   [name] [name]  ZOX:[id number]  DOB: 06-17-1936  DOA: 07/19/2015 PCP: [name], MD  Brief narrative: [name] [name] is a 79 y.o. male brought into the hospital for confusion and lethargy and left facial droop noted by the family. He was noted to have a temperature of 100.2 was tachypnea hypoxic and also found to be retaining about 330 mL of urine. Creatinine was slightly elevated from baseline. A Foley catheter was placed.   Subjective: No complaints of fever shortness of breath cough nausea, vomiting, abdominal pain or diarrhea.  Assessment/Plan: Principal Problem:  Acute encephalopathy/fever - No source of infection found-mental status has returned to baseline - T max 102.9  -CT of the head and MRI of the brain do not show any acute abnormality -Blood cultures 2 remain negative, UA was not suggestive of infection -Check influenza panel  Active Problems: Urinary retention -Started on Flomax-continue Foley catheter today  Respiratory failure, acute / Acute combined systolic and diastolic congestive heart failure (HCC) -Requiring 2 L of oxygen, chest x-ray reveals pulmonary edema -Start IV furosemide, monitor I&O & daily weights  Atrial fibrillation -Starting this afternoon- no prior history of this per patient and per prior cardiology notes -CHA2DS2-VASc Score 5 - Start Lovenox -Continue carvedilol - rate is currently controlled - Obtain 2-D echo and thyroid functions -We'll look into cost of eliquis and also check to see if he's a candidate  Diabetes mellitus 2 -glipizide on hold -Continue sliding scale insulin  Acute on chronic kidney disease 4 with mild chronic metabolic acidosis  -Possibly in relation to fluid overload versus bladder outlet obstruction  - renal ultrasound reveals "normal kidneys"  Hypotension with history of Essential hypertension -Decrease dose of hydralazine-continue Coreg and  amlodipine  Gout- -continue colchicine at 0.3 mg daily  Hypothyroid  -Continue thyroid replacement    Code Status:     Code Status Orders        Start     Ordered   07/20/15 0130  Full code   Continuous     07/20/15 0131    Advance Directive Documentation        Most Recent Value   Type of Advance Directive  Living will   Pre-existing out of facility DNR order (yellow form or pink MOST form)     "MOST" Form in Place?       Family Communication: Wife at bedside Disposition Plan: Home when stable DVT prophylaxis:Lovenox   Antibiotics: Anti-infectives    Start     Dose/Rate Route Frequency Ordered Stop   07/20/15 0200  cefTRIAXone (ROCEPHIN) 1 g in dextrose 5 % 50 mL IVPB  Status:  Discontinued     1 g 100 mL/hr over 30 Minutes Intravenous Daily at bedtime 07/20/15 0138 07/21/15 1010   07/20/15 0145  cefTRIAXone (ROCEPHIN) 1 g in dextrose 5 % 50 mL IVPB  Status:  Discontinued     1 g 100 mL/hr over 30 Minutes Intravenous Every 24 hours 07/20/15 0131 07/20/15 0138      Objective: Filed Weights   07/19/15 1932 07/20/15 0144  Weight: 84.006 kg (185 lb 3.2 oz) 77.928 kg (171 lb 12.8 oz)    Intake/Output Summary (Last 24 hours) at 07/21/15 1610 Last data filed at 07/21/15 1332  Gross per 24 hour  Intake    780 ml  Output    750 ml  Net     30 ml     Vitals Filed Vitals:   07/21/15  6962 07/21/15 0553 07/21/15 1327 07/21/15 1515  BP:  107/45 112/52 112/52  Pulse:  77 63 74  Temp: 100.8 F (38.2 C) 98.7 F (37.1 C) 98.6 F (37 C)   TempSrc: Oral Oral Oral   Resp:  18 16   Height:      Weight:      SpO2:  98% 99%     Exam:  General:  Pt is alert, not in acute distress  HEENT: No icterus, No thrush, oral mucosa moist  Cardiovascular: regular rate and rhythm, S1/S2 No murmur  Respiratory:  Crackles in right lower lobe, no wheezing, no rhonchi, pulse ox 98% on 2 L  Abdomen: Soft, +Bowel sounds, non tender, non distended, no guarding  MSK: No LE  edema, cyanosis or clubbing  Data Reviewed: Basic Metabolic Panel:  Recent Labs Lab 07/19/15 1943 07/20/15 0211 07/21/15 0628  NA 135  138 137 139  K 4.5  4.5 4.3 4.9  CL 103  105 105 105  CO2 21* 21* 20*  GLUCOSE 169*  175* 140* 147*  BUN 88*  86* 91* 96*  CREATININE 4.70*  4.60* 4.57* 4.71*  CALCIUM 9.4 9.4 9.3   Liver Function Tests:  Recent Labs Lab 07/19/15 1943  AST 18  ALT 10*  ALKPHOS 35*  BILITOT 0.7  PROT 7.5  ALBUMIN 3.8   No results for input(s): LIPASE, AMYLASE in the last 168 hours. No results for input(s): AMMONIA in the last 168 hours. CBC:  Recent Labs Lab 07/19/15 1943 07/20/15 0211 07/21/15 0628  WBC 8.3 7.7 6.8  NEUTROABS 6.2  --   --   HGB 12.2*  10.8* 10.5* 10.2*  HCT 36.0*  33.8* 33.3* 31.4*  MCV 93.6 93.8 95.2  PLT 143* 138* 138*   Cardiac Enzymes:  Recent Labs Lab 07/20/15 0211  TROPONINI 0.05*   BNP (last 3 results)  Recent Labs  11/23/14 1532 12/10/14 0530  BNP 592.3* 326.5*    ProBNP (last 3 results)  Recent Labs  09/03/14 1337  PROBNP 125.0*    CBG:  Recent Labs Lab 07/20/15 1641 07/20/15 2130 07/20/15 2327 07/21/15 0755 07/21/15 1212  GLUCAP 181* 219* 222* 147* 176*    Recent Results (from the past 240 hour(s))  MRSA PCR Screening     Status: None   Collection Time: 07/20/15  1:58 AM  Result Value Ref Range Status   MRSA by PCR NEGATIVE NEGATIVE Final    Comment:        The GeneXpert MRSA Assay (FDA approved for NASAL specimens only), is one component of a comprehensive MRSA colonization surveillance program. It is not intended to diagnose MRSA infection nor to guide or monitor treatment for MRSA infections.   Culture, Urine     Status: None   Collection Time: 07/20/15  3:00 AM  Result Value Ref Range Status   Specimen Description URINE, CLEAN CATCH  Final   Special Requests NONE  Final   Culture MULTIPLE SPECIES PRESENT, SUGGEST RECOLLECTION  Final   Report Status 07/21/2015  FINAL  Final  Culture, blood (routine x 2)     Status: None (Preliminary result)   Collection Time: 07/20/15  2:16 PM  Result Value Ref Range Status   Specimen Description BLOOD RIGHT HAND  Final   Special Requests BOTTLES DRAWN AEROBIC AND ANAEROBIC 5CC  Final   Culture NO GROWTH < 24 HOURS  Final   Report Status PENDING  Incomplete  Culture, blood (routine x 2)  Status: None (Preliminary result)   Collection Time: 07/20/15  4:00 PM  Result Value Ref Range Status   Specimen Description BLOOD RIGHT ANTECUBITAL  Final   Special Requests IN PEDIATRIC BOTTLE 2CC  Final   Culture NO GROWTH < 24 HOURS  Final   Report Status PENDING  Incomplete     Studies: Ct Head Wo Contrast  07/19/2015   CLINICAL DATA:  Confusion with weakness and gait disturbance today. Left facial droop. Initial encounter.  EXAM: CT HEAD WITHOUT CONTRAST  TECHNIQUE: Contiguous axial images were obtained from the base of the skull through the vertex without intravenous contrast.  COMPARISON:  Head CT 12/12/2010.  MRI brain 12/13/2010.  FINDINGS: There is no evidence of acute intracranial hemorrhage, mass lesion, brain edema or extra-axial fluid collection. Diffuse dural calcifications and mild symmetric thickening of the tentorium are similar to the prior study. There is no evidence of hydrocephalus or cortical based infarct. Mild chronic small vessel ischemic changes are stable. Intracranial vascular calcifications noted.  The visualized paranasal sinuses, mastoid air cells and middle ears are clear. The calvarium is intact.  IMPRESSION: Stable examination.  No acute intracranial findings.   Electronically Signed   By: Carey Bullocks M.D.   On: 07/19/2015 21:26   Mr Brain Wo Contrast  07/20/2015   CLINICAL DATA:  Initial evaluation for acute left facial droop  EXAM: MRI HEAD WITHOUT CONTRAST  TECHNIQUE: Multiplanar, multiecho pulse sequences of the brain and surrounding structures were obtained without intravenous contrast.   COMPARISON:  Prior CT from 07/19/2015.  FINDINGS: No abnormal foci of restricted diffusion to suggest acute intracranial infarct. Gray-white matter differentiation maintained. Normal intravascular flow voids are preserved. No acute or chronic intracranial hemorrhage.  Mild diffuse prominence of the CSF containing spaces is compatible with generalized cerebral atrophy. Patchy T2/FLAIR hyperintensity within the periventricular and deep white matter most consistent with chronic small vessel ischemic disease.  No mass lesion, midline shift or mass effect. No hydrocephalus. No extra-axial fluid collection.  Craniocervical junction within normal limits. Pituitary gland unremarkable.  No acute abnormality about the orbits.  Mild scattered mucosal thickening within the ethmoidal air cells. Paranasal sinuses are otherwise clear. Minimal opacity within the inferior left mastoid air cells. Inner ear structures grossly normal.  Bone marrow signal intensity within normal limits. Scalp soft tissues unremarkable.  IMPRESSION: 1. No acute intracranial infarct or other process identified. 2. Mild atrophy with chronic small vessel ischemic disease.   Electronically Signed   By: Rise Mu M.D.   On: 07/20/2015 05:49   US Renal  07/20/2015   CLINICAL DATA:  Bladder outflow obstruction  EXAM: RENAL / URINARY TRACT ULTRASOUND COMPLETE  COMPARISON:  December 18, 2010  FINDINGS: Right Kidney:  Length: 10.1 cm. Echogenicity within normal limits. No mass or hydronephrosis visualized.  Left Kidney:  Length: 10.5 cm. Echogenicity within normal limits. No mass or hydronephrosis visualized.  Bladder:  The bladder is decompressed with Foley catheter in place.  IMPRESSION: Normal kidneys.  The bladder is decompressed with Foley catheter in place.   Electronically Signed   By: Sherian Rein M.D.   On: 07/20/2015 18:48   Dg Chest Port 1 View  07/20/2015   CLINICAL DATA:  Fever.  EXAM: PORTABLE CHEST 1 VIEW  COMPARISON:  12/10/2014   FINDINGS: Cardiac silhouette remains enlarged. Thoracic aortic calcification is noted. There is a small amount of thickening or fluid in the right minor fissure, decreased from the prior study. Pulmonary vascular congestion and mild  interstitial densities bilaterally are stable to slightly improved from the prior study. There is improved aeration of the right lung base. No segmental airspace consolidation or pneumothorax is seen. No acute osseous abnormality is identified.  IMPRESSION: Cardiomegaly with mild pulmonary vascular congestion and mild interstitial edema, stable to slightly improved from the prior study. Likely small volume pleural fluid in the minor fissure.   Electronically Signed   By: Sebastian Ache M.D.   On: 07/20/2015 14:50    Scheduled Meds:  Scheduled Meds: . amLODipine  10 mg Oral Daily  . antiseptic oral rinse  7 mL Mouth Rinse BID  . aspirin  325 mg Oral Daily  . carvedilol  12.5 mg Oral BID WC  . colchicine  0.6 mg Oral Daily  . furosemide  80 mg Intravenous Q12H  . heparin  5,000 Units Subcutaneous 3 times per day  . hydrALAZINE  10 mg Oral 3 times per day  . insulin aspart  0-15 Units Subcutaneous TID WC  . insulin aspart  0-5 Units Subcutaneous QHS  . isosorbide mononitrate  60 mg Oral Daily  . levothyroxine  200 mcg Oral QAC breakfast  . olopatadine  1 drop Both Eyes BID  . rosuvastatin  20 mg Oral Daily  . tamsulosin  0.4 mg Oral Daily   Continuous Infusions:   Time spent on care of this patient: 35 min   Yajayra Feldt, MD 07/21/2015, 4:10 PM  LOS: 2 days   [facility] Office  [phone] Pager - Text Page per [email or web address] If 7PM-7AM, please contact night-coverage [email or web address]

## 2015-07-21 NOTE — Progress Notes (Signed)
Physical Therapy Treatment Patient Details Name: [name] [name] MRN: [id number] DOB: [birth year] Today's Date: 07/21/2015    History of Present Illness 79 y.o. male admitted to [facility] on 07/19/15 for L sided facial droop and AMS.  Pt dx with acute encepholopathy (suspect toxic-metabolic). MRI negative for stroke, fever (blood cultures pending, urine cx negative), acute on chronic kidney disease.  Pt with significant PMhx of CAD, HTN, CVA, CHF, DM, CKD, and gout.    PT Comments    Pt takes quite a bit of time to get EOB and moving, but once up doesn't require heavy assistance.  Wife reports pt has a very sedentary lifestyle at home.  He would benefit from HHPT f/u at discharge.   PT to follow acutely for deficits listed below.     Follow Up Recommendations  Home health PT;Supervision for mobility/OOB     Equipment Recommendations  None recommended by PT    Recommendations for Other Services   NA     Precautions / Restrictions Precautions Precautions: Fall Precaution Comments: mildly unsteady on his feet Required Braces or Orthoses: Other Brace/Splint Other Brace/Splint: pt wears and knee brace on his knee at home, alternates sides and at times, per wife, doesn't wear it at all.     Mobility  Bed Mobility Overal bed mobility: Needs Assistance Bed Mobility: Supine to Sit     Supine to sit: Min assist     General bed mobility comments: Min assist to first help progress bil legs to EOB.  Pt attempted, but it took significant amount of time to just move part of the way to EOB. Min assist to then switch to assit at his trunk.   Transfers Overall transfer level: Needs assistance Equipment used: Rolling walker (2 wheeled) Transfers: Sit to/from Stand Sit to Stand: Min guard         General transfer comment: Min guard assist for safety to steady his trunk for balance during transitions  Ambulation/Gait Ambulation/Gait assistance: Min guard Ambulation Distance (Feet): 65  Feet Assistive device: Rolling walker (2 wheeled) Gait Pattern/deviations: Step-through pattern;Trunk flexed;Shuffle Gait velocity: decreased Gait velocity interpretation: Below normal speed for age/gender General Gait Details: Pt with shuflling, slow gait pattern, when asked if he could stand any taller he reported "no".            Balance Overall balance assessment: Needs assistance Sitting-balance support: No upper extremity supported;Feet supported Sitting balance-Leahy Scale: Fair Sitting balance - Comments: pt was unable to reach down to pull up his socks.    Standing balance support: Bilateral upper extremity supported Standing balance-Leahy Scale: Fair                      Cognition Arousal/Alertness: Awake/alert Behavior During Therapy: WFL for tasks assessed/performed Overall Cognitive Status: Within Functional Limits for tasks assessed                      Exercises General Exercises - Upper Extremity Shoulder Flexion: AROM;Both;10 reps;Seated (within available ROM) Elbow Flexion: AROM;Both;10 reps;Seated Elbow Extension: AROM;Both;10 reps;Seated General Exercises - Lower Extremity Long Arc Quad: AROM;Both;10 reps;Seated Hip ABduction/ADduction: AROM;Both;10 reps;Seated (adduction only agianst pillow for support) Hip Flexion/Marching: AROM;Both;20 reps;Seated Toe Raises: AROM;Both;20 reps;Seated Heel Raises: AROM;Both;20 reps;Seated        Pertinent Vitals/Pain Pain Assessment: Faces Faces Pain Scale: Hurts even more Pain Location: chronic bil knee pain, pt also reporting buttocks pain.  Pain Descriptors / Indicators: Aching;Burning Pain Intervention(s): Limited activity within  patient's tolerance;Monitored during session;Repositioned    Home Living Family/patient expects to be discharged to:: Private residence Living Arrangements: Spouse/significant other;Children Available Help at Discharge: Family;Available 24 hours/day Type of Home:  House Home Access: Stairs to enter Entrance Stairs-Rails: Left Home Layout: One level Home Equipment: Walker - 2 wheels;Cane - quad      Prior Function Level of Independence: Independent;Independent with assistive device(s)      Comments: per pt he sometimes uses an assistive device, it depends on the day.  Wife reports he sleeps until 1 or 2 pm and then she really has to encourage him to get up and get moving.    PT Goals (current goals can now be found in the care plan section) Acute Rehab PT Goals Patient Stated Goal: to go home, rest PT Goal Formulation: With patient/family Time For Goal Achievement: 08/04/15 Potential to Achieve Goals: Good    Frequency  Min 3X/week         End of Session   Activity Tolerance: Patient limited by fatigue;Patient limited by pain Patient left: in chair;with call bell/phone within reach    Time: 1610-9604 PT Time Calculation (min) (ACUTE ONLY): 27 min  Charges:  $Gait Training: 8-22 mins                      Meika Earll B. Lynnelle Mesmer, PT, DPT [phone]   07/21/2015, 3:22 PM

## 2015-07-21 NOTE — Progress Notes (Signed)
Inpatient Diabetes Program Recommendations  AACE/ADA: New Consensus Statement on Inpatient Glycemic Control (2015)  Target Ranges:  Prepandial:   less than 140 mg/dL      Peak postprandial:   less than 180 mg/dL (1-2 hours)      Critically ill patients:  140 - 180 mg/dL   Results for ELIZJAH, NOBLET (MRN [id number]) as of 07/21/2015 10:18  Ref. Range 07/20/2015 11:36 07/20/2015 16:41 07/20/2015 21:30 07/20/2015 23:27 07/21/2015 07:55  Glucose-Capillary Latest Ref Range: 65-99 mg/dL 96 409 (H) 811 (H) 914 (H) 147 (H)   Review of Glycemic Control  Diabetes history: DM 2 Outpatient Diabetes medications: Glipizide 2.5 mg QAM, 5 mg QPM, Glyburide 2.5 mg QAM, 5 mg QPM Current orders for Inpatient glycemic control: Novolog Moderate TID, Novolog HS  A1c 6.4% Good control at home.  Inpatient Diabetes Program Recommendations: Insulin - Meal Coverage: Glucose increases into the 200's around meal times. Please consider Novolog 3 units meal coverage TID.  Thanks,  Christena Deem RN, MSN, [facility] Inpatient Diabetes Coordinator Team Pager [phone] (8a-5p)

## 2015-07-21 NOTE — Progress Notes (Signed)
Central telemetry notified Rn of EKG rhythm change, I notified Dr Butler Denmark New orders received. Pt asymptomatic of episode. 07/21/2015 4:16 PM [name] [name]

## 2015-07-22 ENCOUNTER — Inpatient Hospital Stay (HOSPITAL_COMMUNITY): Payer: Medicare Other

## 2015-07-22 ENCOUNTER — Ambulatory Visit (HOSPITAL_COMMUNITY): Payer: Medicare Other

## 2015-07-22 DIAGNOSIS — E785 Hyperlipidemia, unspecified: Secondary | ICD-10-CM

## 2015-07-22 DIAGNOSIS — I4891 Unspecified atrial fibrillation: Secondary | ICD-10-CM

## 2015-07-22 DIAGNOSIS — E039 Hypothyroidism, unspecified: Secondary | ICD-10-CM

## 2015-07-22 LAB — CBC
HCT: 31.4 % — ABNORMAL LOW (ref 39.0–52.0)
HEMOGLOBIN: 9.9 g/dL — AB (ref 13.0–17.0)
MCH: 29.5 pg (ref 26.0–34.0)
MCHC: 31.5 g/dL (ref 30.0–36.0)
MCV: 93.5 fL (ref 78.0–100.0)
Platelets: 144 10*3/uL — ABNORMAL LOW (ref 150–400)
RBC: 3.36 MIL/uL — AB (ref 4.22–5.81)
RDW: 12.7 % (ref 11.5–15.5)
WBC: 6.5 10*3/uL (ref 4.0–10.5)

## 2015-07-22 LAB — GLUCOSE, CAPILLARY
GLUCOSE-CAPILLARY: 135 mg/dL — AB (ref 65–99)
GLUCOSE-CAPILLARY: 190 mg/dL — AB (ref 65–99)

## 2015-07-22 LAB — BASIC METABOLIC PANEL
ANION GAP: 15 (ref 5–15)
BUN: 102 mg/dL — ABNORMAL HIGH (ref 6–20)
CALCIUM: 9.1 mg/dL (ref 8.9–10.3)
CO2: 19 mmol/L — AB (ref 22–32)
CREATININE: 4.67 mg/dL — AB (ref 0.61–1.24)
Chloride: 104 mmol/L (ref 101–111)
GFR, EST AFRICAN AMERICAN: 13 mL/min — AB (ref >60)
GFR, EST NON AFRICAN AMERICAN: 11 mL/min — AB (ref >60)
Glucose, Bld: 140 mg/dL — ABNORMAL HIGH (ref 65–99)
Potassium: 4.7 mmol/L (ref 3.5–5.1)
SODIUM: 138 mmol/L (ref 135–145)

## 2015-07-22 MED ORDER — SODIUM CHLORIDE 0.9 % IV BOLUS (SEPSIS)
250.0000 mL | Freq: Once | INTRAVENOUS | Status: AC
  Administered 2015-07-22: 250 mL via INTRAVENOUS

## 2015-07-22 MED ORDER — LEVOFLOXACIN IN D5W 750 MG/150ML IV SOLN
750.0000 mg | Freq: Once | INTRAVENOUS | Status: AC
  Administered 2015-07-22: 750 mg via INTRAVENOUS
  Filled 2015-07-22: qty 150

## 2015-07-22 MED ORDER — LEVOFLOXACIN IN D5W 500 MG/100ML IV SOLN: 500.0000 mg | INTRAVENOUS | Status: DC

## 2015-07-22 MED ORDER — SODIUM CHLORIDE 0.9 % IV BOLUS (SEPSIS): 250.0000 mL | Freq: Once | INTRAVENOUS | Status: DC

## 2015-07-22 MED ORDER — LEVOTHYROXINE SODIUM 150 MCG PO TABS
150.0000 ug | ORAL_TABLET | Freq: Every day | ORAL | Status: DC
  Administered 2015-07-22 – 2015-07-23 (×2): 150 ug via ORAL
  Filled 2015-07-22 (×2): qty 1

## 2015-07-22 NOTE — Progress Notes (Signed)
ANTIBIOTIC CONSULT NOTE - INITIAL  Pharmacy Consult for Levaquin Indication: pneumonia/CAP   Patient Measurements: Height: 5' 10.8" (179.8 cm) Weight: 171 lb 12.8 oz (77.928 kg) IBW/kg (Calculated) : 74.84  Vital Signs: Temp: 100.4 F (38 C) (10/06 0655) Temp Source: Oral (10/06 0655) BP: 105/54 mmHg (10/06 0531) Pulse Rate: 102 (10/06 0531) Intake/Output from previous day: 10/05 0701 - 10/06 0700 In: 240 [P.O.:240] Out: 1750 [Urine:1750]   Labs:  Recent Labs  07/20/15 0211 07/21/15 0628 07/22/15 0551  WBC 7.7 6.8 6.5  HGB 10.5* 10.2* 9.9*  PLT 138* 138* 144*  CREATININE 4.57* 4.71* 4.67*   Estimated CrCl: < 20 ml/min  Microbiology: 10/4: blood cx: ngtd 10/4 urine: multiple species suggest recollect   Abx: Levaquin: 10/6>>  Assessment: 79yoM admitted with confusion/lethary, afib fever and concern for CAP  Goal of Therapy:  Eradication of infection   Plan:  -Levaquin 750 mg x 1 nowl then 500 mg q 48H   Pollyann Samples, PharmD, BCPS 07/22/2015, 11:14 AM Pager: [phone]

## 2015-07-22 NOTE — Progress Notes (Signed)
Patient spiking temps this shift, Flu swab negative, Dr. Clearence Ped aware, temp this morning 101.1, tylenol administered and 250 ml normal saline bolus ordered and infusing at this time, will recheck temp and continue to monitor.

## 2015-07-22 NOTE — Evaluation (Signed)
Occupational Therapy Evaluation and Discharge Summary Patient Details Name: [name] [name] MRN: [id number] DOB: 05-15-1936 Today's Date: 07/22/2015    History of Present Illness 79 y.o. male admitted to [facility] on 07/19/15 for L sided facial droop and AMS.  Pt dx with acute encepholopathy (suspect toxic-metabolic). MRI negative for stroke, fever (blood cultures pending, urine cx negative), acute on chronic kidney disease.  Pt with significant PMhx of CAD, HTN, CVA, CHF, DM, CKD, and gout.   Clinical Impression   Pt admitted with the above diagnosis.  Pt appears to be at or close to baseline status with adls. Pt doing all adls with S this morning.  Had long discussion with pt about being more active at home and pushing himself to do more of his own self care.  Pt agrees he is sedentary.  Wife home with pt at all times and he is safe to d/c from OT standpoint and has no acute OT needs.      Follow Up Recommendations  No OT follow up;Supervision - Intermittent    Equipment Recommendations  None recommended by OT    Recommendations for Other Services       Precautions / Restrictions Precautions Precautions: Fall Precaution Comments: mildly unsteady on his feet Required Braces or Orthoses: Other Brace/Splint Other Brace/Splint: pt wears and knee brace on his knee at home, alternates sides and at times, per wife, doesn't wear it at all.  Restrictions Weight Bearing Restrictions: No      Mobility Bed Mobility               General bed mobility comments: pt in chair on arrival.  Transfers Overall transfer level: Needs assistance Equipment used: Rolling walker (2 wheeled) Transfers: Sit to/from [employer] Sit to Stand: Supervision Stand pivot transfers: Supervision       General transfer comment: Pt did not need hands on assist for mobility today. Pt required cues for hand placment and safety.    Balance Overall balance assessment: Needs  assistance Sitting-balance support: No upper extremity supported;Feet supported Sitting balance-Leahy Scale: Good Sitting balance - Comments: Pt donned and doffed socks w/o assist.   Standing balance support: Bilateral upper extremity supported;During functional activity Standing balance-Leahy Scale: Fair Standing balance comment: pt could stand and let go of walker to fasten pants etc in standing.                            ADL Overall ADL's : At baseline                                       General ADL Comments: Son and wife state pt is at baseline with his adls. Pt can bathe, dress, groom and toilet with S at this time.  He uses walker to ambulate in room and cognitively has returned to baseline. PT is seeing for balance.     Vision Vision Assessment?: No apparent visual deficits   Perception     Praxis      Pertinent Vitals/Pain Pain Assessment: No/denies pain     Hand Dominance Right   Extremity/Trunk Assessment Upper Extremity Assessment Upper Extremity Assessment: Overall WFL for tasks assessed   Lower Extremity Assessment Lower Extremity Assessment: Defer to PT evaluation   Cervical / Trunk Assessment Cervical / Trunk Assessment: Normal   Communication Communication Communication: No difficulties  Cognition Arousal/Alertness: Awake/alert Behavior During Therapy: WFL for tasks assessed/performed Overall Cognitive Status: Within Functional Limits for tasks assessed                     General Comments       Exercises       Shoulder Instructions      Home Living Family/patient expects to be discharged to:: Private residence Living Arrangements: Spouse/significant other;Children Available Help at Discharge: Family;Available 24 hours/day Type of Home: House Home Access: Stairs to enter [employer] of Steps: 2 Entrance Stairs-Rails: Left Home Layout: One level     Bathroom Shower/Tub: Retail banker: Standard     Home Equipment: Environmental consultant - 2 wheels;Cane - quad          Prior Functioning/Environment Level of Independence: Independent with assistive device(s)        Comments: per pt he sometimes uses an assistive device, it depends on the day.  Wife reports he sleeps until 1 or 2 pm and then she really has to encourage him to get up and get moving.     OT Diagnosis:     OT Problem List:     OT Treatment/Interventions:      OT Goals(Current goals can be found in the care plan section) Acute Rehab OT Goals Patient Stated Goal: to go home, rest OT Goal Formulation: All assessment and education complete, DC therapy  OT Frequency:     Barriers to D/C:            Co-evaluation              End of Session Equipment Utilized During Treatment: Rolling walker Nurse Communication: Mobility status  Activity Tolerance: Patient tolerated treatment well Patient left: in chair;with call bell/phone within reach;with family/visitor present   Time: 1237-1302 OT Time Calculation (min): 25 min Charges:  OT General Charges $OT Visit: 1 Procedure OT Evaluation $Initial OT Evaluation Tier I: 1 Procedure OT Treatments $Self Care/Home Management : 8-22 mins G-Codes:    Hope Budds 07/28/2015, 1:10 PM  [phone]

## 2015-07-22 NOTE — Care Management Important Message (Signed)
Important Message  Patient Details  Name: [name] [name] MRN: [id number] Date of Birth: [birth year]   Medicare Important Message Given:  Yes-second notification given    Kyla Balzarine 07/22/2015, 11:28 AM

## 2015-07-22 NOTE — Progress Notes (Signed)
[facility] Progress Note   [name] [name]  ZOX:[id number]  DOB: Apr 12, 1936  DOA: 07/19/2015 PCP: [name], MD  Brief narrative: [name] [name] is a 79 y.o. male brought into the hospital for confusion and lethargy and left facial droop noted by the family. He was noted to have a temperature of 100.2 was tachypnea hypoxic and also found to be retaining about 330 mL of urine. Creatinine was slightly elevated from baseline. A Foley catheter was placed.   Subjective: No complaints of fever shortness of breath cough nausea, vomiting, abdominal pain or diarrhea.  Assessment/Plan: Principal Problem:  Acute encephalopathy/fever - No source of infection found-mental status has returned to baseline - T max 103.9  -CT of the head and MRI of the brain do not show any acute abnormality -Blood cultures 2 remain negative, UA was not suggestive of infection, no cough, sinus drainage, ear ache, no rash -Influenza panel negative - Possibly from other viral causes? Not on any new medications to suggest a drug fever  Active Problems: Urinary retention -Started on Flomax- d/c Foley catheter today  Respiratory failure, acute / Acute combined systolic and diastolic congestive heart failure (HCC) -  chest x-ray reveals pulmonary edema- crackle in RLL -continue IV furosemide, monitor I&O & daily weights - has been weaned off on O2 today  Atrial fibrillation (new diagnosis) -Starting 10/5 - no prior history of this per patient and per prior cardiology notes -CHA2DS2-VASc Score 5 - Started Lovenox - start Coumadin- not a candidate for NOAC due to renal function -Continue carvedilol - rate is currently controlled - Obtained 2-D echo reveals systolic CHF with EF or 45% - Thyroid functions O2 to be elevated and therefore Synthroid dose decreased  Hyperthyroidism -As mentioned above, Synthroid dose decreased from 200-150 -Recheck levels in 6-8 weeks   Diabetes mellitus 2 -glipizide  on hold -Continue sliding scale insulin  Acute on chronic kidney disease 4 with mild chronic metabolic acidosis  -Possibly in relation to fluid overload versus bladder outlet obstruction  - renal ultrasound reveals "normal kidneys"  Hypotension with history of Essential hypertension -Decreased dose of hydralazine-continue Coreg and amlodipine  Gout- -continue colchicine at 0.3 mg daily  Hypothyroid  -Continue thyroid replacement    Code Status:     Code Status Orders        Start     Ordered   07/20/15 0130  Full code   Continuous     07/20/15 0131    Advance Directive Documentation        Most Recent Value   Type of Advance Directive  Living will   Pre-existing out of facility DNR order (yellow form or pink MOST form)     "MOST" Form in Place?       Family Communication: Wife and son at bedside Disposition Plan: Home when stable DVT prophylaxis:Lovenox  Procedures:  2 D ECHO Left ventricle: The cavity size was mildly dilated. Wall thickness was normal. Systolic function was mildly to moderately reduced. The estimated ejection fraction was in the range of 40% to 45%. There is mild hypokinesis of the inferior myocardium. - Mitral valve: There was mild regurgitation. - Right ventricle: Systolic function was mildly reduced. - Tricuspid valve: There was moderate regurgitation. - Pulmonary arteries: PA peak pressure: 54 mm Hg (S).  Antibiotics: Anti-infectives    Start     Dose/Rate Route Frequency Ordered Stop   07/24/15 1130  levofloxacin (LEVAQUIN) IVPB 500 mg     500 mg 100 mL/hr  over 60 Minutes Intravenous Every 48 hours 07/22/15 1109     07/22/15 1115  levofloxacin (LEVAQUIN) IVPB 750 mg     750 mg 100 mL/hr over 90 Minutes Intravenous  Once 07/22/15 1108 07/22/15 1341   07/20/15 0200  cefTRIAXone (ROCEPHIN) 1 g in dextrose 5 % 50 mL IVPB  Status:  Discontinued     1 g 100 mL/hr over 30 Minutes Intravenous Daily at bedtime 07/20/15 0138 07/21/15 1010    07/20/15 0145  cefTRIAXone (ROCEPHIN) 1 g in dextrose 5 % 50 mL IVPB  Status:  Discontinued     1 g 100 mL/hr over 30 Minutes Intravenous Every 24 hours 07/20/15 0131 07/20/15 0138      Objective: Filed Weights   07/19/15 1932 07/20/15 0144 07/22/15 1119  Weight: 84.006 kg (185 lb 3.2 oz) 77.928 kg (171 lb 12.8 oz) 77.565 kg (171 lb)    Intake/Output Summary (Last 24 hours) at 07/22/15 1651 Last data filed at 07/22/15 1300  Gross per 24 hour  Intake    480 ml  Output   1320 ml  Net   -840 ml     Vitals Filed Vitals:   07/22/15 0531 07/22/15 0655 07/22/15 1119 07/22/15 1400  BP: 105/54   107/61  Pulse: 102   73  Temp: 101.1 F (38.4 C) 100.4 F (38 C)  98.1 F (36.7 C)  TempSrc: Oral Oral  Oral  Resp: 18   18  Height:      Weight:   77.565 kg (171 lb)   SpO2: 96%   97%    Exam:  General:  Pt is alert, not in acute distress  HEENT: No icterus, No thrush, oral mucosa moist  Cardiovascular: regular rate and rhythm, S1/S2 No murmur  Respiratory:  Crackles in right lower lobe, no wheezing, no rhonchi, pulse ox 98% on room air  Abdomen: Soft, +Bowel sounds, non tender, non distended, no guarding  MSK: No LE edema, cyanosis or clubbing  Data Reviewed: Basic Metabolic Panel:  Recent Labs Lab 07/19/15 1943 07/20/15 0211 07/21/15 0628 07/22/15 0551  NA 135  138 137 139 138  K 4.5  4.5 4.3 4.9 4.7  CL 103  105 105 105 104  CO2 21* 21* 20* 19*  GLUCOSE 169*  175* 140* 147* 140*  BUN 88*  86* 91* 96* 102*  CREATININE 4.70*  4.60* 4.57* 4.71* 4.67*  CALCIUM 9.4 9.4 9.3 9.1   Liver Function Tests:  Recent Labs Lab 07/19/15 1943  AST 18  ALT 10*  ALKPHOS 35*  BILITOT 0.7  PROT 7.5  ALBUMIN 3.8   No results for input(s): LIPASE, AMYLASE in the last 168 hours. No results for input(s): AMMONIA in the last 168 hours. CBC:  Recent Labs Lab 07/19/15 1943 07/20/15 0211 07/21/15 0628 07/22/15 0551  WBC 8.3 7.7 6.8 6.5  NEUTROABS 6.2  --   --    --   HGB 12.2*  10.8* 10.5* 10.2* 9.9*  HCT 36.0*  33.8* 33.3* 31.4* 31.4*  MCV 93.6 93.8 95.2 93.5  PLT 143* 138* 138* 144*   Cardiac Enzymes:  Recent Labs Lab 07/20/15 0211  TROPONINI 0.05*   BNP (last 3 results)  Recent Labs  11/23/14 1532 12/10/14 0530  BNP 592.3* 326.5*    ProBNP (last 3 results)  Recent Labs  09/03/14 1337  PROBNP 125.0*    CBG:  Recent Labs Lab 07/21/15 1212 07/21/15 1701 07/21/15 2213 07/22/15 0752 07/22/15 1135  GLUCAP 176* 164* 160* 135*  190*    Recent Results (from the past 240 hour(s))  MRSA PCR Screening     Status: None   Collection Time: 07/20/15  1:58 AM  Result Value Ref Range Status   MRSA by PCR NEGATIVE NEGATIVE Final    Comment:        The GeneXpert MRSA Assay (FDA approved for NASAL specimens only), is one component of a comprehensive MRSA colonization surveillance program. It is not intended to diagnose MRSA infection nor to guide or monitor treatment for MRSA infections.   Culture, Urine     Status: None   Collection Time: 07/20/15  3:00 AM  Result Value Ref Range Status   Specimen Description URINE, CLEAN CATCH  Final   Special Requests NONE  Final   Culture MULTIPLE SPECIES PRESENT, SUGGEST RECOLLECTION  Final   Report Status 07/21/2015 FINAL  Final  Culture, blood (routine x 2)     Status: None (Preliminary result)   Collection Time: 07/20/15  2:16 PM  Result Value Ref Range Status   Specimen Description BLOOD RIGHT HAND  Final   Special Requests BOTTLES DRAWN AEROBIC AND ANAEROBIC 5CC  Final   Culture NO GROWTH 2 DAYS  Final   Report Status PENDING  Incomplete  Culture, blood (routine x 2)     Status: None (Preliminary result)   Collection Time: 07/20/15  4:00 PM  Result Value Ref Range Status   Specimen Description BLOOD RIGHT ANTECUBITAL  Final   Special Requests IN PEDIATRIC BOTTLE 2CC  Final   Culture NO GROWTH 2 DAYS  Final   Report Status PENDING  Incomplete     Studies: US  Renal  07/20/2015   CLINICAL DATA:  Bladder outflow obstruction  EXAM: RENAL / URINARY TRACT ULTRASOUND COMPLETE  COMPARISON:  December 18, 2010  FINDINGS: Right Kidney:  Length: 10.1 cm. Echogenicity within normal limits. No mass or hydronephrosis visualized.  Left Kidney:  Length: 10.5 cm. Echogenicity within normal limits. No mass or hydronephrosis visualized.  Bladder:  The bladder is decompressed with Foley catheter in place.  IMPRESSION: Normal kidneys.  The bladder is decompressed with Foley catheter in place.   Electronically Signed   By: Sherian Rein M.D.   On: 07/20/2015 18:48   Dg Chest Port 1 View  07/22/2015   CLINICAL DATA:  Hypoxia, fever last night  EXAM: PORTABLE CHEST 1 VIEW  COMPARISON:  Portable exam 0801 hours compared to 07/20/2015  FINDINGS: Enlargement of cardiac silhouette with pulmonary vascular congestion.  Atherosclerotic calcification aorta.  Persistent mild perihilar edema.  No segmental consolidation, pleural effusion or pneumothorax.  Osseous structures unremarkable.  IMPRESSION: Enlargement of cardiac silhouette with pulmonary vascular congestion and persistent mild interstitial edema, little changed.   Electronically Signed   By: Ulyses Southward M.D.   On: 07/22/2015 08:22    Scheduled Meds:  Scheduled Meds: . amLODipine  10 mg Oral Daily  . antiseptic oral rinse  7 mL Mouth Rinse BID  . aspirin  325 mg Oral Daily  . carvedilol  12.5 mg Oral BID WC  . colchicine  0.3 mg Oral Daily  . enoxaparin (LOVENOX) injection  1 mg/kg Subcutaneous Q24H  . furosemide  120 mg Intravenous Q12H  . insulin aspart  0-15 Units Subcutaneous TID WC  . insulin aspart  0-5 Units Subcutaneous QHS  . isosorbide mononitrate  60 mg Oral Daily  . [START ON 07/24/2015] levofloxacin (LEVAQUIN) IV  500 mg Intravenous Q48H  . levothyroxine  150 mcg Oral QAC  breakfast  . olopatadine  1 drop Both Eyes BID  . rosuvastatin  20 mg Oral Daily  . tamsulosin  0.4 mg Oral Daily   Continuous Infusions:    Time spent on care of this patient: 35 min   Farooq Petrovich, MD 07/22/2015, 4:51 PM  LOS: 3 days   [facility] Office  [phone] Pager - Text Page per [email or web address] If 7PM-7AM, please contact night-coverage [email or web address]

## 2015-07-22 NOTE — Progress Notes (Signed)
  Echocardiogram 2D Echocardiogram has been performed.  Leta Jungling M 07/22/2015, 3:13 PM

## 2015-07-23 DIAGNOSIS — J189 Pneumonia, unspecified organism: Secondary | ICD-10-CM

## 2015-07-23 DIAGNOSIS — N320 Bladder-neck obstruction: Secondary | ICD-10-CM

## 2015-07-23 DIAGNOSIS — I482 Chronic atrial fibrillation, unspecified: Secondary | ICD-10-CM

## 2015-07-23 DIAGNOSIS — R509 Fever, unspecified: Secondary | ICD-10-CM

## 2015-07-23 DIAGNOSIS — E038 Other specified hypothyroidism: Secondary | ICD-10-CM

## 2015-07-23 DIAGNOSIS — E1121 Type 2 diabetes mellitus with diabetic nephropathy: Secondary | ICD-10-CM

## 2015-07-23 DIAGNOSIS — E1122 Type 2 diabetes mellitus with diabetic chronic kidney disease: Secondary | ICD-10-CM

## 2015-07-23 LAB — BASIC METABOLIC PANEL
ANION GAP: 15 (ref 5–15)
BUN: 113 mg/dL — ABNORMAL HIGH (ref 6–20)
CO2: 19 mmol/L — AB (ref 22–32)
Calcium: 9 mg/dL (ref 8.9–10.3)
Chloride: 104 mmol/L (ref 101–111)
Creatinine, Ser: 4.98 mg/dL — ABNORMAL HIGH (ref 0.61–1.24)
GFR calc Af Amer: 12 mL/min — ABNORMAL LOW (ref >60)
GFR, EST NON AFRICAN AMERICAN: 10 mL/min — AB (ref >60)
GLUCOSE: 137 mg/dL — AB (ref 65–99)
POTASSIUM: 4.4 mmol/L (ref 3.5–5.1)
Sodium: 138 mmol/L (ref 135–145)

## 2015-07-23 LAB — CBC
HEMATOCRIT: 29.8 % — AB (ref 39.0–52.0)
HEMOGLOBIN: 9.8 g/dL — AB (ref 13.0–17.0)
MCH: 30.4 pg (ref 26.0–34.0)
MCHC: 32.9 g/dL (ref 30.0–36.0)
MCV: 92.5 fL (ref 78.0–100.0)
Platelets: 143 10*3/uL — ABNORMAL LOW (ref 150–400)
RBC: 3.22 MIL/uL — AB (ref 4.22–5.81)
RDW: 12.8 % (ref 11.5–15.5)
WBC: 5.6 10*3/uL (ref 4.0–10.5)

## 2015-07-23 LAB — GLUCOSE, CAPILLARY
GLUCOSE-CAPILLARY: 93 mg/dL (ref 65–99)
GLUCOSE-CAPILLARY: 142 mg/dL — AB (ref 65–99)
Glucose-Capillary: 227 mg/dL — ABNORMAL HIGH (ref 65–99)
Glucose-Capillary: 129 mg/dL — ABNORMAL HIGH (ref 65–99)

## 2015-07-23 MED ORDER — WARFARIN SODIUM 5 MG PO TABS: 5.0000 mg | ORAL_TABLET | Freq: Every day | ORAL | Status: DC

## 2015-07-23 MED ORDER — TAMSULOSIN HCL 0.4 MG PO CAPS: 0.4000 mg | ORAL_CAPSULE | Freq: Every day | ORAL | Status: DC

## 2015-07-23 MED ORDER — LEVOFLOXACIN 500 MG PO TABS: 500.0000 mg | ORAL_TABLET | ORAL | Status: DC

## 2015-07-23 MED ORDER — WARFARIN VIDEO: Freq: Once | Status: DC

## 2015-07-23 MED ORDER — COUMADIN BOOK
Freq: Once | Status: AC
  Administered 2015-07-23: 1
  Filled 2015-07-23: qty 1

## 2015-07-23 MED ORDER — LEVOTHYROXINE SODIUM 150 MCG PO TABS: 150.0000 ug | ORAL_TABLET | Freq: Every day | ORAL | Status: DC

## 2015-07-23 NOTE — Progress Notes (Signed)
Nsg Discharge Note  Admit Date:  07/19/2015 Discharge date: 07/23/2015   [name] [name] to be D/C'd Home per MD order.  AVS completed.  Copy for chart, and copy for patient signed, and dated. Patient/caregiver able to verbalize understanding.  Discharge Medication:   Medication List    TAKE these medications        acetaminophen 500 MG tablet  Commonly known as:  TYLENOL  Take 500 mg by mouth 2 (two) times daily.     amLODipine 10 MG tablet  Commonly known as:  NORVASC  Take 10 mg by mouth daily.     aspirin 325 MG tablet  Take 325 mg by mouth daily.     calcitRIOL 0.25 MCG capsule  Commonly known as:  ROCALTROL  Take 0.25-0.5 mcg by mouth See admin instructions. Pt takes 0.65mcg on odd days and 0.64mcg on even days as directed     carvedilol 12.5 MG tablet  Commonly known as:  COREG  Take 1 tablet (12.5 mg total) by mouth 2 (two) times daily with a meal.     Cod Liver Oil Oil  Take 15 mLs by mouth daily.     colchicine 0.6 MG tablet  Take 0.5 tablets (0.3 mg total) by mouth daily.     furosemide 40 MG tablet  Commonly known as:  LASIX  TAKE 2 TABLETS (80 MG TOTAL) BY MOUTH DAILY.     glipiZIDE 5 MG tablet  Commonly known as:  GLUCOTROL  Take 2.5-5 mg by mouth See admin instructions. Pt takes 2.5mg  in am before breakfast and  in pm before dinner     glyBURIDE 5 MG tablet  Commonly known as:  DIABETA  Take 1 tablet (5 mg total) by mouth daily with breakfast. Take a 1/2 pill before breakfast and a whole pill before 2nd meal of the day.     hydrALAZINE 50 MG tablet  Commonly known as:  APRESOLINE  Take 1.5 tablets (75 mg total) by mouth 3 (three) times daily.     isosorbide mononitrate 60 MG 24 hr tablet  Commonly known as:  IMDUR  Take 1 tablet (60 mg total) by mouth daily.     levofloxacin 500 MG tablet  Commonly known as:  LEVAQUIN  Take 1 tablet (500 mg total) by mouth every other day.  Start taking on:  07/24/2015     levothyroxine 150 MCG tablet   Commonly known as:  SYNTHROID, LEVOTHROID  Take 1 tablet (150 mcg total) by mouth daily.     nitroGLYCERIN 0.4 MG SL tablet  Commonly known as:  NITROSTAT  Place 1 tablet (0.4 mg total) under the tongue every 5 (five) minutes as needed for chest pain.     olopatadine 0.1 % ophthalmic solution  Commonly known as:  PATANOL  Place 1 drop into both eyes 2 (two) times daily.     rosuvastatin 20 MG tablet  Commonly known as:  CRESTOR  Take 1 tablet (20 mg total) by mouth daily.     tamsulosin 0.4 MG Caps capsule  Commonly known as:  FLOMAX  Take 1 capsule (0.4 mg total) by mouth daily.     warfarin 5 MG tablet  Commonly known as:  COUMADIN  Take 1 tablet (5 mg total) by mouth daily.        Discharge Assessment: Filed Vitals:   07/23/15 0518  BP: 137/82  Pulse: 129  Temp: 99.3 F (37.4 C)  Resp: 20   Skin clean, dry and  intact without evidence of skin break down, no evidence of skin tears noted. IV catheter discontinued intact. Site without signs and symptoms of complications - no redness or edema noted at insertion site, patient denies c/o pain - only slight tenderness at site.  Dressing with slight pressure applied.  D/c Instructions-Education: Discharge instructions given to patient/family with verbalized understanding. D/c education completed with patient/family including follow up instructions, medication list, d/c activities limitations if indicated, with other d/c instructions as indicated by MD - patient able to verbalize understanding, all questions fully answered. Patient instructed to return to ED, call 911, or call MD for any changes in condition.  Patient escorted via WC, and D/C home via private auto.  Kern Reap, RN 07/23/2015 12:17 PM

## 2015-07-23 NOTE — Discharge Summary (Addendum)
Physician Discharge Summary  [name] [name]:[id number] DOB: January 23, 1936 DOA: 07/19/2015  PCP: [name], MD  Admit date: 07/19/2015 Discharge date: 07/23/2015  Time spent: 55 minutes  Recommendations for Outpatient Follow-up:  1. F/u INR   Discharge Condition: stable    Discharge Diagnoses:  Principal Problem:   Acute encephalopathy Active Problems:   Acute combined systolic and diastolic congestive heart failure (HCC)   CAP (community acquired pneumonia)   Chronic a-fib (HCC)   Hypothyroidism   Type II diabetes mellitus with renal manifestations (HCC)   Hyperlipidemia   GOUT   Essential hypertension   Chest pain   Malnutrition of moderate degree   Acute on chronic renal failure (HCC)   Respiratory failure, acute (HCC)   History of present illness:  [name] [name] is a 79 y.o. male brought into the hospital for confusion and lethargy and left facial droop noted by the family. He was noted to have a temperature of 100.2 was tachypnea hypoxic and also found to be retaining about 330 mL of urine. Creatinine was slightly elevated from baseline. A Foley catheter was placed.  Hospital Course:  Principal Problem: Acute encephalopathy/fever/ CAP/ Acute resp failure - T max 103.9  - has crackles in RLL and mild cough- started on Levaquin with resolution of fever- will continue to treat for CAP for 7 day course -CT of the head and MRI of the brain do not show any acute abnormality -Influenza panel negative   Active Problems: Respiratory failure, acute / Acute combined systolic and diastolic congestive heart failure (HCC) - chest x-ray reveals b/l infiltrates, pulm edema - placed on IV furosemide with improvement in respiratory status - pulse ox 97 % on room air today - 2-D echo reveals systolic CHF with EF of 45% - can resume home dose of Furosemide, advised daily weights and low sodium diet  Atrial fibrillation (new diagnosis) -Starting 10/5 - no prior history  of this per patient and per prior cardiology notes -CHA2DS2-VASc Score 5 - Started Lovenox and Coumadin- not a candidate for NOAC due to renal function -Continue carvedilol - rate relatively controlled in the hospital - Obtained 2-D echo - reveals systolic CHF with EF or 45% - Thyroid functions O2 to be elevated and therefore Synthroid dose decreased  Urinary retention - foley placed in ER -Started on Flomax- d/c'd Foley catheter on 10/6- no trouble with voiding  Hyperthyroidism -As mentioned above, Synthroid dose decreased from 200-150 -Recheck levels in 6-8 weeks  Diabetes mellitus 2 -resume glipizide    Acute on chronic kidney disease 4 with mild chronic metabolic acidosis  -Possibly in relation to fluid overload versus bladder outlet obstruction  - renal ultrasound reveals "normal kidneys"  Hypotension with history of Essential hypertension -Decreased dose of hydralazine-continue Coreg and amlodipine  Gout- -continue colchicine at 0.3 mg daily  Hypothyroid  -Continue thyroid replacement   Procedures: 2 D ECHO Left ventricle: The cavity size was mildly dilated. Wall thickness was normal. Systolic function was mildly to moderately reduced. The estimated ejection fraction was in the range of 40% to 45%. There is mild hypokinesis of the inferior myocardium. - Mitral valve: There was mild regurgitation. - Right ventricle: Systolic function was mildly reduced. - Tricuspid valve: There was moderate regurgitation. - Pulmonary arteries: PA peak pressure: 54 mm Hg (S).  Discharge Exam: Filed Weights   07/20/15 0144 07/22/15 1119 07/23/15 0523  Weight: 77.928 kg (171 lb 12.8 oz) 77.565 kg (171 lb) 77.656 kg (171 lb 3.2 oz)  Filed Vitals:   07/23/15 0518  BP: 137/82  Pulse: 85  Temp: 99.3 F (37.4 C)  Resp: 20    General: AAO x 3, no distress Cardiovascular: RRR, no murmurs  Respiratory: clear to auscultation bilaterally GI: soft, non-tender,  non-distended, bowel sound positive  Discharge Instructions You were cared for by a hospitalist during your hospital stay. If you have any questions about your discharge medications or the care you received while you were in the hospital after you are discharged, you can call the unit and asked to speak with the hospitalist on call if the hospitalist that took care of you is not available. Once you are discharged, your primary care physician will handle any further medical issues. Please note that NO REFILLS for any discharge medications will be authorized once you are discharged, as it is imperative that you return to your primary care physician (or establish a relationship with a primary care physician if you do not have one) for your aftercare needs so that they can reassess your need for medications and monitor your lab values.      Discharge Instructions    (HEART FAILURE PATIENTS) Call MD:  Anytime you have any of the following symptoms: 1) 3 pound weight gain in 24 hours or 5 pounds in 1 week 2) shortness of breath, with or without a dry hacking cough 3) swelling in the hands, feet or stomach 4) if you have to sleep on extra pillows at night in order to breathe.    Complete by:  As directed      Diet - low sodium heart healthy    Complete by:  As directed      Increase activity slowly    Complete by:  As directed             Medication List    TAKE these medications        acetaminophen 500 MG tablet  Commonly known as:  TYLENOL  Take 500 mg by mouth 2 (two) times daily.     amLODipine 10 MG tablet  Commonly known as:  NORVASC  Take 10 mg by mouth daily.     aspirin 325 MG tablet  Take 325 mg by mouth daily.     calcitRIOL 0.25 MCG capsule  Commonly known as:  ROCALTROL  Take 0.25-0.5 mcg by mouth See admin instructions. Pt takes 0.19mcg on odd days and 0.51mcg on even days as directed     carvedilol 12.5 MG tablet  Commonly known as:  COREG  Take 1 tablet (12.5 mg total)  by mouth 2 (two) times daily with a meal.     Cod Liver Oil Oil  Take 15 mLs by mouth daily.     colchicine 0.6 MG tablet  Take 0.5 tablets (0.3 mg total) by mouth daily.     furosemide 40 MG tablet  Commonly known as:  LASIX  TAKE 2 TABLETS (80 MG TOTAL) BY MOUTH DAILY.     glipiZIDE 5 MG tablet  Commonly known as:  GLUCOTROL  Take 2.5-5 mg by mouth See admin instructions. Pt takes 2.5mg  in am before breakfast and  in pm before dinner     glyBURIDE 5 MG tablet  Commonly known as:  DIABETA  Take 1 tablet (5 mg total) by mouth daily with breakfast. Take a 1/2 pill before breakfast and a whole pill before 2nd meal of the day.     hydrALAZINE 50 MG tablet  Commonly known as:  APRESOLINE  Take 1.5 tablets (75 mg total) by mouth 3 (three) times daily.     isosorbide mononitrate 60 MG 24 hr tablet  Commonly known as:  IMDUR  Take 1 tablet (60 mg total) by mouth daily.     levofloxacin 500 MG tablet  Commonly known as:  LEVAQUIN  Take 1 tablet (500 mg total) by mouth every other day.  Start taking on:  07/24/2015     levothyroxine 150 MCG tablet  Commonly known as:  SYNTHROID, LEVOTHROID  Take 1 tablet (150 mcg total) by mouth daily.     nitroGLYCERIN 0.4 MG SL tablet  Commonly known as:  NITROSTAT  Place 1 tablet (0.4 mg total) under the tongue every 5 (five) minutes as needed for chest pain.     olopatadine 0.1 % ophthalmic solution  Commonly known as:  PATANOL  Place 1 drop into both eyes 2 (two) times daily.     rosuvastatin 20 MG tablet  Commonly known as:  CRESTOR  Take 1 tablet (20 mg total) by mouth daily.     tamsulosin 0.4 MG Caps capsule  Commonly known as:  FLOMAX  Take 1 capsule (0.4 mg total) by mouth daily.     warfarin 5 MG tablet  Commonly known as:  COUMADIN  Take 1 tablet (5 mg total) by mouth daily.       Allergies  Allergen Reactions  . Tomato     Too much acid   Follow-up Information    Follow up with [facility].    Why:  HHRN for pt/inr draw on monday   Contact information:   [address] [place] [address] [phone]        The results of significant diagnostics from this hospitalization (including imaging, microbiology, ancillary and laboratory) are listed below for reference.    Significant Diagnostic Studies: Ct Head Wo Contrast  07/19/2015   CLINICAL DATA:  Confusion with weakness and gait disturbance today. Left facial droop. Initial encounter.  EXAM: CT HEAD WITHOUT CONTRAST  TECHNIQUE: Contiguous axial images were obtained from the base of the skull through the vertex without intravenous contrast.  COMPARISON:  Head CT 12/12/2010.  MRI brain 12/13/2010.  FINDINGS: There is no evidence of acute intracranial hemorrhage, mass lesion, brain edema or extra-axial fluid collection. Diffuse dural calcifications and mild symmetric thickening of the tentorium are similar to the prior study. There is no evidence of hydrocephalus or cortical based infarct. Mild chronic small vessel ischemic changes are stable. Intracranial vascular calcifications noted.  The visualized paranasal sinuses, mastoid air cells and middle ears are clear. The calvarium is intact.  IMPRESSION: Stable examination.  No acute intracranial findings.   Electronically Signed   By: Carey Bullocks M.D.   On: 07/19/2015 21:26   Mr Brain Wo Contrast  07/20/2015   CLINICAL DATA:  Initial evaluation for acute left facial droop  EXAM: MRI HEAD WITHOUT CONTRAST  TECHNIQUE: Multiplanar, multiecho pulse sequences of the brain and surrounding structures were obtained without intravenous contrast.  COMPARISON:  Prior CT from 07/19/2015.  FINDINGS: No abnormal foci of restricted diffusion to suggest acute intracranial infarct. Gray-white matter differentiation maintained. Normal intravascular flow voids are preserved. No acute or chronic intracranial hemorrhage.  Mild diffuse prominence of the CSF containing spaces is compatible with  generalized cerebral atrophy. Patchy T2/FLAIR hyperintensity within the periventricular and deep white matter most consistent with chronic small vessel ischemic disease.  No mass lesion, midline shift or mass effect. No hydrocephalus.  No extra-axial fluid collection.  Craniocervical junction within normal limits. Pituitary gland unremarkable.  No acute abnormality about the orbits.  Mild scattered mucosal thickening within the ethmoidal air cells. Paranasal sinuses are otherwise clear. Minimal opacity within the inferior left mastoid air cells. Inner ear structures grossly normal.  Bone marrow signal intensity within normal limits. Scalp soft tissues unremarkable.  IMPRESSION: 1. No acute intracranial infarct or other process identified. 2. Mild atrophy with chronic small vessel ischemic disease.   Electronically Signed   By: Rise Mu M.D.   On: 07/20/2015 05:49   US Renal  07/20/2015   CLINICAL DATA:  Bladder outflow obstruction  EXAM: RENAL / URINARY TRACT ULTRASOUND COMPLETE  COMPARISON:  December 18, 2010  FINDINGS: Right Kidney:  Length: 10.1 cm. Echogenicity within normal limits. No mass or hydronephrosis visualized.  Left Kidney:  Length: 10.5 cm. Echogenicity within normal limits. No mass or hydronephrosis visualized.  Bladder:  The bladder is decompressed with Foley catheter in place.  IMPRESSION: Normal kidneys.  The bladder is decompressed with Foley catheter in place.   Electronically Signed   By: Sherian Rein M.D.   On: 07/20/2015 18:48   Dg Chest Port 1 View  07/22/2015   CLINICAL DATA:  Hypoxia, fever last night  EXAM: PORTABLE CHEST 1 VIEW  COMPARISON:  Portable exam 0801 hours compared to 07/20/2015  FINDINGS: Enlargement of cardiac silhouette with pulmonary vascular congestion.  Atherosclerotic calcification aorta.  Persistent mild perihilar edema.  No segmental consolidation, pleural effusion or pneumothorax.  Osseous structures unremarkable.  IMPRESSION: Enlargement of cardiac  silhouette with pulmonary vascular congestion and persistent mild interstitial edema, little changed.   Electronically Signed   By: Ulyses Southward M.D.   On: 07/22/2015 08:22   Dg Chest Port 1 View  07/20/2015   CLINICAL DATA:  Fever.  EXAM: PORTABLE CHEST 1 VIEW  COMPARISON:  12/10/2014  FINDINGS: Cardiac silhouette remains enlarged. Thoracic aortic calcification is noted. There is a small amount of thickening or fluid in the right minor fissure, decreased from the prior study. Pulmonary vascular congestion and mild interstitial densities bilaterally are stable to slightly improved from the prior study. There is improved aeration of the right lung base. No segmental airspace consolidation or pneumothorax is seen. No acute osseous abnormality is identified.  IMPRESSION: Cardiomegaly with mild pulmonary vascular congestion and mild interstitial edema, stable to slightly improved from the prior study. Likely small volume pleural fluid in the minor fissure.   Electronically Signed   By: Sebastian Ache M.D.   On: 07/20/2015 14:50    Microbiology: Recent Results (from the past 240 hour(s))  MRSA PCR Screening     Status: None   Collection Time: 07/20/15  1:58 AM  Result Value Ref Range Status   MRSA by PCR NEGATIVE NEGATIVE Final    Comment:        The GeneXpert MRSA Assay (FDA approved for NASAL specimens only), is one component of a comprehensive MRSA colonization surveillance program. It is not intended to diagnose MRSA infection nor to guide or monitor treatment for MRSA infections.   Culture, Urine     Status: None   Collection Time: 07/20/15  3:00 AM  Result Value Ref Range Status   Specimen Description URINE, CLEAN CATCH  Final   Special Requests NONE  Final   Culture MULTIPLE SPECIES PRESENT, SUGGEST RECOLLECTION  Final   Report Status 07/21/2015 FINAL  Final  Culture, blood (routine x 2)     Status: None (  Preliminary result)   Collection Time: 07/20/15  2:16 PM  Result Value Ref Range  Status   Specimen Description BLOOD RIGHT HAND  Final   Special Requests BOTTLES DRAWN AEROBIC AND ANAEROBIC 5CC  Final   Culture NO GROWTH 3 DAYS  Final   Report Status PENDING  Incomplete  Culture, blood (routine x 2)     Status: None (Preliminary result)   Collection Time: 07/20/15  4:00 PM  Result Value Ref Range Status   Specimen Description BLOOD RIGHT ANTECUBITAL  Final   Special Requests IN PEDIATRIC BOTTLE 2CC  Final   Culture NO GROWTH 3 DAYS  Final   Report Status PENDING  Incomplete     Labs: Basic Metabolic Panel:  Recent Labs Lab 07/19/15 1943 07/20/15 0211 07/21/15 0628 07/22/15 0551 07/23/15 0925  NA 135  138 137 139 138 138  K 4.5  4.5 4.3 4.9 4.7 4.4  CL 103  105 105 105 104 104  CO2 21* 21* 20* 19* 19*  GLUCOSE 169*  175* 140* 147* 140* 137*  BUN 88*  86* 91* 96* 102* 113*  CREATININE 4.70*  4.60* 4.57* 4.71* 4.67* 4.98*  CALCIUM 9.4 9.4 9.3 9.1 9.0   Liver Function Tests:  Recent Labs Lab 07/19/15 1943  AST 18  ALT 10*  ALKPHOS 35*  BILITOT 0.7  PROT 7.5  ALBUMIN 3.8   No results for input(s): LIPASE, AMYLASE in the last 168 hours. No results for input(s): AMMONIA in the last 168 hours. CBC:  Recent Labs Lab 07/19/15 1943 07/20/15 0211 07/21/15 0628 07/22/15 0551 07/23/15 0925  WBC 8.3 7.7 6.8 6.5 5.6  NEUTROABS 6.2  --   --   --   --   HGB 12.2*  10.8* 10.5* 10.2* 9.9* 9.8*  HCT 36.0*  33.8* 33.3* 31.4* 31.4* 29.8*  MCV 93.6 93.8 95.2 93.5 92.5  PLT 143* 138* 138* 144* 143*   Cardiac Enzymes:  Recent Labs Lab 07/20/15 0211  TROPONINI 0.05*   BNP: BNP (last 3 results)  Recent Labs  11/23/14 1532 12/10/14 0530  BNP 592.3* 326.5*    ProBNP (last 3 results)  Recent Labs  09/03/14 1337  PROBNP 125.0*    CBG:  Recent Labs Lab 07/22/15 1135 07/22/15 1700 07/22/15 2123 07/23/15 0750 07/23/15 1155  GLUCAP 190* 227* 93 129* 142*       SignedCalvert Cantor, MD [facility] 07/23/2015,  3:26 PM

## 2015-07-23 NOTE — Care Management Note (Addendum)
Case Management Note  Patient Details  Name: [name] [name] MRN: [id number] Date of Birth: 08-01-1936  Subjective/Objective:        NCM spoke with daughter and patient, patient states the physical therapist told him he does not need hhpt, daughter called wife on the phone relayed this information to her , and asked what did she think, then said they do not want the hhpt.  Patient is for dc today.  NCM informed Education administrator.  Patient will need pt/inr lab draw on Monday he also has chf.  NCM spoke with daughter she states she will go ahead a do the [facility] with [facility], referral made to Tiffany with [facility].  They will send results to patient's pcp.    Action/Plan:   Expected Discharge Date:                  Expected Discharge Plan:  Home w Home Health Services  In-House Referral:     Discharge planning Services  CM Consult  Post Acute Care Choice:  Home Health Choice offered to:  Patient, Adult Children  DME Arranged:    DME Agency:     HH Arranged:  Patient Refused [facility] HH Agency:   [facility]  Status of Service:  Completed, signed off  Medicare Important Message Given:  Yes-second notification given Date Medicare IM Given:    Medicare IM give by:    Date Additional Medicare IM Given:    Additional Medicare Important Message give by:     If discussed at Long Length of Stay Meetings, dates discussed:    Additional Comments:  Leone Haven, RN 07/23/2015, 11:26 AM

## 2015-07-25 LAB — CULTURE, BLOOD (ROUTINE X 2)
CULTURE: NO GROWTH
CULTURE: NO GROWTH

## 2015-07-26 ENCOUNTER — Other Ambulatory Visit: Payer: Self-pay | Admitting: Family Medicine

## 2015-07-28 NOTE — Progress Notes (Signed)
HPI: FU coronary artery disease. His most recent catheterization was in March 2005 and showed a 40% left main, no significant disease in the circumflex, total occlusion of the ramus branch with a 50% first marginal, 40% proximal, followed by 95% mid, and 90% mid distal stenosis in the right coronary artery. The patient had successful PCI of the right coronary artery both the mid and mid-to- distal right lesions. Nuclear study 2/16 showed EF 39; inferior/inferolateral infarct with mild peri-infarct ischemia. Treated medically. Echo 10/16 showed EF 40-45; mild MR, moderate TR; moderately elevated pulmonary pressure. Patient was admitted October 2016 with encephalopathy and pneumonia. Also had atrial fibrillation and was started on Coumadin but be has not taken. TSH was low and Synthroid dose was decreased. Since last seen, He denies dyspnea, palpitations, chest pain or syncope. No bleeding.  Current Outpatient Prescriptions  Medication Sig Dispense Refill  . acetaminophen (TYLENOL) 500 MG tablet Take 500 mg by mouth 2 (two) times daily.     Marland Kitchen amLODipine (NORVASC) 10 MG tablet Take 10 mg by mouth daily.  4  . calcitRIOL (ROCALTROL) 0.25 MCG capsule Take 0.25-0.5 mcg by mouth See admin instructions. Pt takes 0.50mcg on odd days and 0.53mcg on even days as directed    . carvedilol (COREG) 12.5 MG tablet Take 1 tablet (12.5 mg total) by mouth 2 (two) times daily with a meal. 60 tablet 11  . Cod Liver Oil OIL Take 15 mLs by mouth daily.    . colchicine 0.6 MG tablet Take 0.5 tablets (0.3 mg total) by mouth daily. (Patient taking differently: Take 0.6 mg by mouth daily. )    . furosemide (LASIX) 40 MG tablet TAKE 2 TABLETS (80 MG TOTAL) BY MOUTH DAILY. 60 tablet 5  . glipiZIDE (GLUCOTROL) 5 MG tablet Take 2.5-5 mg by mouth See admin instructions. Pt takes 2.5mg  in am before breakfast and  in pm before dinner  3  . glipiZIDE (GLUCOTROL) 5 MG tablet TAKE 1/2 TAB BEFORE BREAKFAST AND FULL TAB BEFORE  DINNER 150 tablet 4  . glyBURIDE (DIABETA) 5 MG tablet Take 1 tablet (5 mg total) by mouth daily with breakfast. Take a 1/2 pill before breakfast and a whole pill before 2nd meal of the day. (Patient taking differently: Take 2.5-5 mg by mouth 2 (two) times daily with a meal. Take a 1/2 pill before breakfast and a whole pill before 2nd meal of the day.) 135 tablet 3  . hydrALAZINE (APRESOLINE) 50 MG tablet Take 1.5 tablets (75 mg total) by mouth 3 (three) times daily. (Patient taking differently: Take 100 mg by mouth 3 (three) times daily. ) 90 tablet 3  . isosorbide mononitrate (IMDUR) 60 MG 24 hr tablet Take 1 tablet (60 mg total) by mouth daily. 30 tablet 6  . levofloxacin (LEVAQUIN) 500 MG tablet Take 1 tablet (500 mg total) by mouth every other day. 3 tablet 0  . levothyroxine (SYNTHROID, LEVOTHROID) 150 MCG tablet Take 1 tablet (150 mcg total) by mouth daily. 30 tablet 0  . nitroGLYCERIN (NITROSTAT) 0.4 MG SL tablet Place 1 tablet (0.4 mg total) under the tongue every 5 (five) minutes as needed for chest pain. 25 tablet 3  . olopatadine (PATANOL) 0.1 % ophthalmic solution Place 1 drop into both eyes 2 (two) times daily. 5 mL 3  . rosuvastatin (CRESTOR) 20 MG tablet Take 1 tablet (20 mg total) by mouth daily. 90 tablet 2  . tamsulosin (FLOMAX) 0.4 MG CAPS capsule Take 1 capsule (  0.4 mg total) by mouth daily. 30 capsule 0  . warfarin (COUMADIN) 5 MG tablet Take 1 tablet (5 mg total) by mouth daily. 30 tablet 0   No current facility-administered medications for this visit.     Past Medical History  Diagnosis Date  . CAD (coronary artery disease)     a. s/p PCI to mid and mid-distal RCA, 40% sten L main, no sig dz LCx  b. myoview 11/24/2014 inferoapex ischemia, medical management due to high risk for contrast nephropathy  . HTN (hypertension)   . Hyperlipidemia   . Cerebrovascular accident (HCC)   . CHF (congestive heart failure) (HCC)     EF 35% echo 2012 ; EF 44% [facility]oview 2012; [facility]  . Diabetes mellitus   . 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 Unspecified hypothyroidism   . Gout     Past Surgical History  Procedure Laterality Date  . Thyroidectomy      Social History   Social History  . Marital Status: Married    Spouse Name: N/A  . Number of Children: N/A  . Years of Education: N/A   Occupational History  . Not on file.   Social History Main Topics  . Smoking status: Former Smoker -- 0.50 packs/day    Types: Cigarettes    Quit date: 10/16/1953  . Smokeless tobacco: Not on file     Comment: quit in the 70's  . Alcohol Use: No  . Drug Use: No  . Sexual Activity: Not Currently   Other Topics Concern  . Not on file   Social History Narrative   [place] in [place] his wife and has 6 kids.     ROS: no fevers or chills, productive cough, hemoptysis, dysphasia, odynophagia, melena, hematochezia, dysuria, hematuria, rash, seizure activity, orthopnea, PND, pedal edema, claudication. Remaining systems are negative.  Physical Exam: Well-developed well-nourished in no acute distress.  Skin is warm and dry.  HEENT is normal.  Neck is supple.  Chest is clear to auscultation with normal expansion.  Cardiovascular exam is irregular Abdominal exam nontender or distended. No masses palpated. Extremities show no edema. neuro grossly intact     Electrocardiogram-atrial fibrillation, normal axis, occasional PVC or aberrantly conducted beat, nonspecific ST changes.

## 2015-07-29 ENCOUNTER — Telehealth: Payer: Self-pay | Admitting: Family Medicine

## 2015-07-29 NOTE — Telephone Encounter (Signed)
Lorain ChildesFYI Dois DavenportSandra is calling to let md know there was a delay in starter care service. Dr hunter had order for adv to check pt protime.  Pt is not taking coumadin until they talk with heart md tomorrow. Pt is on ASA. Dois DavenportSandra just want to informed md she did not do protime check.

## 2015-07-29 NOTE — Telephone Encounter (Signed)
FYI

## 2015-07-30 ENCOUNTER — Ambulatory Visit (INDEPENDENT_AMBULATORY_CARE_PROVIDER_SITE_OTHER): Payer: Medicare Other | Admitting: Cardiology

## 2015-07-30 ENCOUNTER — Encounter: Payer: Self-pay | Admitting: Cardiology

## 2015-07-30 VITALS — BP 110/52 | HR 77 | Ht 69.0 in | Wt 178.0 lb

## 2015-07-30 DIAGNOSIS — E785 Hyperlipidemia, unspecified: Secondary | ICD-10-CM | POA: Diagnosis not present

## 2015-07-30 DIAGNOSIS — I4891 Unspecified atrial fibrillation: Secondary | ICD-10-CM | POA: Insufficient documentation

## 2015-07-30 DIAGNOSIS — I482 Chronic atrial fibrillation, unspecified: Secondary | ICD-10-CM

## 2015-07-30 DIAGNOSIS — I10 Essential (primary) hypertension: Secondary | ICD-10-CM | POA: Diagnosis not present

## 2015-07-30 DIAGNOSIS — N184 Chronic kidney disease, stage 4 (severe): Secondary | ICD-10-CM

## 2015-07-30 DIAGNOSIS — I2510 Atherosclerotic heart disease of native coronary artery without angina pectoris: Secondary | ICD-10-CM | POA: Diagnosis not present

## 2015-07-30 DIAGNOSIS — I255 Ischemic cardiomyopathy: Secondary | ICD-10-CM | POA: Diagnosis not present

## 2015-07-30 DIAGNOSIS — I480 Paroxysmal atrial fibrillation: Secondary | ICD-10-CM

## 2015-07-30 DIAGNOSIS — I5042 Chronic combined systolic (congestive) and diastolic (congestive) heart failure: Secondary | ICD-10-CM

## 2015-07-30 NOTE — Assessment & Plan Note (Signed)
Continue beta blocker and hydralazine/nitrates. No ACE inhibitor given severity of renal insufficiency.

## 2015-07-30 NOTE — Patient Instructions (Signed)
Medication Instructions:   START WARFARIN 5 MG ONCE DAILY  STOP ASPIRIN  Labwork:  INR CHECK Tuesday 08-03-15   Follow-Up:  Your physician recommends that you schedule a follow-up appointment in: 8 WEEKS WITH DR Jens SomRENSHAW   An

## 2015-07-30 NOTE — Assessment & Plan Note (Signed)
Continue statin. 

## 2015-07-30 NOTE — Assessment & Plan Note (Signed)
Patient is euvolemic on examination.Continue present dose of Lasix. 

## 2015-07-30 NOTE — Assessment & Plan Note (Signed)
Followed by nephrology. 

## 2015-07-30 NOTE — Assessment & Plan Note (Signed)
Blood pressure controlled. Continue present medications. 

## 2015-07-30 NOTE — Assessment & Plan Note (Signed)
Patient has developed atrial fibrillation.Embolic risk factors include age greater than 5275, hypertension, coronary artery disease, history of congestive heart failure, Diabetes mellitus and history of CVA. CHADsvasc 8. Begin Coumadin 5 mg daily. Check INR on Tuesday of next week. Continue carvedilol for rate control. He is relatively asymptomatic. I will see him back in 8 weeks and we will decide at that time about cardioversion versus rate control/anticoagulation.

## 2015-07-30 NOTE — Assessment & Plan Note (Signed)
Continue statin. Given need for Coumadin discontinue aspirin.

## 2015-08-03 ENCOUNTER — Ambulatory Visit: Payer: Medicare Other | Admitting: Family Medicine

## 2015-08-03 ENCOUNTER — Ambulatory Visit (INDEPENDENT_AMBULATORY_CARE_PROVIDER_SITE_OTHER): Payer: Medicare Other | Admitting: Pharmacist Clinician (PhC)/ Clinical Pharmacy Specialist

## 2015-08-03 DIAGNOSIS — Z7901 Long term (current) use of anticoagulants: Secondary | ICD-10-CM | POA: Diagnosis not present

## 2015-08-03 DIAGNOSIS — E890 Postprocedural hypothyroidism: Secondary | ICD-10-CM

## 2015-08-03 DIAGNOSIS — I480 Paroxysmal atrial fibrillation: Secondary | ICD-10-CM

## 2015-08-03 LAB — POCT INR: INR: 2

## 2015-08-12 ENCOUNTER — Ambulatory Visit (INDEPENDENT_AMBULATORY_CARE_PROVIDER_SITE_OTHER): Payer: Medicare Other | Admitting: Pharmacist Clinician (PhC)/ Clinical Pharmacy Specialist

## 2015-08-12 DIAGNOSIS — I480 Paroxysmal atrial fibrillation: Secondary | ICD-10-CM

## 2015-08-12 DIAGNOSIS — Z7901 Long term (current) use of anticoagulants: Secondary | ICD-10-CM

## 2015-08-12 LAB — POCT INR: INR: 3.2

## 2015-08-13 ENCOUNTER — Ambulatory Visit (INDEPENDENT_AMBULATORY_CARE_PROVIDER_SITE_OTHER): Payer: Medicare Other | Admitting: Family Medicine

## 2015-08-13 ENCOUNTER — Encounter: Payer: Self-pay | Admitting: Family Medicine

## 2015-08-13 VITALS — BP 128/60 | HR 74 | Temp 98.0°F | Wt 187.0 lb

## 2015-08-13 DIAGNOSIS — J9601 Acute respiratory failure with hypoxia: Secondary | ICD-10-CM | POA: Diagnosis not present

## 2015-08-13 DIAGNOSIS — N189 Chronic kidney disease, unspecified: Secondary | ICD-10-CM

## 2015-08-13 DIAGNOSIS — I5041 Acute combined systolic (congestive) and diastolic (congestive) heart failure: Secondary | ICD-10-CM

## 2015-08-13 DIAGNOSIS — Z23 Encounter for immunization: Secondary | ICD-10-CM

## 2015-08-13 DIAGNOSIS — J189 Pneumonia, unspecified organism: Secondary | ICD-10-CM | POA: Diagnosis not present

## 2015-08-13 DIAGNOSIS — G9340 Encephalopathy, unspecified: Secondary | ICD-10-CM

## 2015-08-13 DIAGNOSIS — N179 Acute kidney failure, unspecified: Secondary | ICD-10-CM

## 2015-08-13 DIAGNOSIS — I480 Paroxysmal atrial fibrillation: Secondary | ICD-10-CM

## 2015-08-13 DIAGNOSIS — N320 Bladder-neck obstruction: Secondary | ICD-10-CM

## 2015-08-13 MED ORDER — LEVOTHYROXINE SODIUM 175 MCG PO TABS: 175.0000 ug | ORAL_TABLET | Freq: Every day | ORAL | Status: DC

## 2015-08-13 NOTE — Progress Notes (Signed)
Tana ConchStephen Constance Whittle, MD  Subjective:  [name] ConradRoy L [name] is a 79 y.o. year old very pleasant male patient who presents for/with See problem oriented charting ROS- no chest pain. Some shrotness of breath with activity. No headache or blurry vision. Denies palpitations.   Past Medical History-  Patient Active Problem List   Diagnosis Date Noted  . Atrial fibrillation (HCC) 07/30/2015    Priority: High  . Ischemic cardiomyopathy 04/13/2011    Priority: High  . Congestive heart failure (HCC) 09/07/2008    Priority: High  . Type II diabetes mellitus with renal manifestations (HCC) 10/31/2007    Priority: High  . Chronic renal disease, stage IV (HCC) 10/31/2007    Priority: High  . Coronary atherosclerosis 09/25/2007    Priority: High  . BACK PAIN 03/20/2008    Priority: Medium  . Hypothyroidism 04/09/2007    Priority: Medium  . Hyperlipidemia 04/09/2007    Priority: Medium  . GOUT 04/09/2007    Priority: Medium  . Essential hypertension 04/09/2007    Priority: Medium  . CEREBROVASCULAR ACCIDENT, HX OF 04/09/2007    Priority: Medium  . Long term (current) use of anticoagulants 08/03/2015    Priority: Low  . CAP (community acquired pneumonia) 07/23/2015    Priority: Low  . Osteoarthritis severe bilateral knees 04/06/2010    Priority: Low  . LEG CRAMPS, IDIOPATHIC 04/06/2010    Priority: Low  . Bladder outflow obstruction   . Acute combined systolic and diastolic congestive heart failure (HCC) 07/21/2015    Medications- reviewed and updated Current Outpatient Prescriptions  Medication Sig Dispense Refill  . amLODipine (NORVASC) 10 MG tablet Take 10 mg by mouth daily.  4  . calcitRIOL (ROCALTROL) 0.25 MCG capsule Take 0.25-0.5 mcg by mouth See admin instructions. Pt takes 0.7825mcg on odd days and 0.785mcg on even days as directed    . carvedilol (COREG) 12.5 MG tablet Take 1 tablet (12.5 mg total) by mouth 2 (two) times daily with a meal. 60 tablet 11  . Cod Liver Oil OIL Take 15 mLs  by mouth daily.    . colchicine 0.6 MG tablet Take 0.5 tablets (0.3 mg total) by mouth daily. (Patient taking differently: Take 0.6 mg by mouth daily. )    . furosemide (LASIX) 40 MG tablet TAKE 2 TABLETS (80 MG TOTAL) BY MOUTH DAILY. 60 tablet 5  . glipiZIDE (GLUCOTROL) 5 MG tablet Take 2.5-5 mg by mouth See admin instructions. Pt takes 2.5mg  in am before breakfast and 5mg  in pm before dinner  3  . glyBURIDE (DIABETA) 5 MG tablet Take 1 tablet (5 mg total) by mouth daily with breakfast. Take a 1/2 pill before breakfast and a whole pill before 2nd meal of the day. (Patient taking differently: Take 2.5-5 mg by mouth 2 (two) times daily with a meal. Take a 1/2 pill before breakfast and a whole pill before 2nd meal of the day.) 135 tablet 3  . hydrALAZINE (APRESOLINE) 50 MG tablet Take 1.5 tablets (75 mg total) by mouth 3 (three) times daily. (Patient taking differently: Take 100 mg by mouth 3 (three) times daily. ) 90 tablet 3  . isosorbide mononitrate (IMDUR) 60 MG 24 hr tablet Take 1 tablet (60 mg total) by mouth daily. 30 tablet 6  . levothyroxine (SYNTHROID, LEVOTHROID) 175 MCG tablet Take 1 tablet (175 mcg total) by mouth daily. 30 tablet 5  . olopatadine (PATANOL) 0.1 % ophthalmic solution Place 1 drop into both eyes 2 (two) times daily. 5 mL 3  .  rosuvastatin (CRESTOR) 20 MG tablet Take 1 tablet (20 mg total) by mouth daily. 90 tablet 2  . tamsulosin (FLOMAX) 0.4 MG CAPS capsule Take 1 capsule (0.4 mg total) by mouth daily. 30 capsule 0  . warfarin (COUMADIN) 5 MG tablet Take 1 tablet (5 mg total) by mouth daily. 30 tablet 0  . acetaminophen (TYLENOL) 500 MG tablet Take 500 mg by mouth 2 (two) times daily.     . nitroGLYCERIN (NITROSTAT) 0.4 MG SL tablet Place 1 tablet (0.4 mg total) under the tongue every 5 (five) minutes as needed for chest pain. (Patient not taking: Reported on 08/13/2015) 25 tablet 3   No current facility-administered medications for this visit.    Objective: BP 128/60  mmHg  Pulse 74  Temp(Src) 98 F (36.7 C)  Wt 187 lb (84.823 kg) Gen: NAD, resting comfortably but appears more fatigued than normal CV: RRR no murmurs rubs or gallops Lungs: CTAB no crackles, wheeze, rhonchi Abdomen: soft/nontender/nondistended/normal bowel sounds. No rebound or guarding.  Ext: no edema Skin: warm, dry Neuro: grossly normal, moves all extremities  Assessment/Plan:  Acute respiratory failure with hypoxia (HCC) Due to CAP (community acquired pneumonia) Leading to Acute encephalopathy S:Patient was brought to hospital in early October for confusion and lethargy as well as concern for left facial droop. Patient was tachypnea, hypoxic and with elevated temperature to 100.2 in ED. Initially required oxygen support but was discharged on room air Temperature later elevated to 103.9. Patient noted to have crackles in RLLL consistent with X-ray findings and was started on levaquin. He had resolution of fever and treated with 7 day course. He had both CT and MRI of brain which were normal- not thought of stroke or TIA as cause of facial droop- likely related to encephalopathy instead. Flu was negative. Patient states his shortness of breath improves slightly each day. He remains fatigued but this is also improving A/P: improving from pneumonia. We discussed this may take 5-8 weeks for him to get back to his baseline but his slow steady improvement is encouraging  Acute combined systolic and diastolic congestive heart failure (HCC) Acute on chronic renal failure (HCC) S: 2-d echo showed EF 45%. Patient required diuresis with IV lasix but this helped improve respiratory status. Placed on low salt diet and advised daily weights. Patient states weight was up when left hospital but has not increased further. Creatinine varies from 3.2 up to nearly 5. Patient was discharged with 4.98.  A/P: was already on  lasix in AM due to CKD with extra dose on days he is SOB. He was sent home on this  regimen but has not had to use extra doses. Get BMET at follow up. Has planned cardiology follow up  Atrial fibrillation (HCC) Hypothyroidism S: started 2nd day of hospitalization. No history a fib.  Chasvasc score of 5 so placed on coumadin with bridge given renal functio. He was on carvedilol and this controlled heart rate. TSH was elevated on what they thought was daily dose of 175 mcg but patient reports to me he had been taken whole time. He is worried too little thyroid medicine is causing him fatigue but discussed this may be due to recovery from illness A/P: check TSH in 6 weeks, titrate levothyroxine to middle ground 175 mcg that patient was supposed to be on after I contacted him several months ago.  Continue coumadin and carvedilol- has cards follow up  Bladder outflow obstruction S: 330 mL of urine noted in bladder  during initial evaluation. Concern for retention so foley catheter was placed. Patient stated on flomax and no difficulty urinating after foley was removed. Patient states his baseline symptoms at home have not improved on the flomax but it does cause him sme lower abdominal discomfort and he would like to stop. Some concern could contribute to renal disease per discharge notes as renal ultrasound did not show medical renal disease.  A/P: patient eager to stop flomax, we agreed to trial off when he runs out of medicine. If he has signs of urinary retention, seek care in ED immediately.   6 week follow up but sooner Return precautions advised.   Orders Placed This Encounter  Procedures  . Flu Vaccine QUAD 36+ mos IM    Meds ordered this encounter  Medications  . levothyroxine (SYNTHROID, LEVOTHROID) 175 MCG tablet    Sig: Take 1 tablet (175 mcg total) by mouth daily.    Dispense:  30 tablet    Refill:  5

## 2015-08-13 NOTE — Patient Instructions (Addendum)
Flu shot received today.  You had a serious infection/pneumonia. This led you to have strain on your heart leading to atrial fibrillation. It also caused fevers up to nearly 104 and confusion.   Every day you are in the hospital, it takes about a week at home to get back to normal. For a younger man, should feel better in 5 weeks/back to normal, but in your cause it may take 2 months or longer.   We opted to change thyroid medicine to 175 mcg. You will see me see me 6 weeks after making this change. I am happy to see you sooner if you don't feel like things are going in the right direction.   We will plan on repeating a chest x-ray for clearance at next visit.

## 2015-08-13 NOTE — Assessment & Plan Note (Addendum)
Hypothyroidism S: started 2nd day of hospitalization. No history a fib.  Chasvasc score of 5 so placed on coumadin with bridge given renal functio. He was on carvedilol and this controlled heart rate. TSH was elevated on what they thought was daily dose of 175 mcg but patient reports to me he had been taken whole time. He is worried too little thyroid medicine is causing him fatigue but discussed this may be due to recovery from illness A/P: check TSH in 6 weeks, titrate levothyroxine to middle ground 175 mcg that patient was supposed to be on after I contacted him several months ago.  Continue coumadin and carvedilol- has cards follow up

## 2015-08-19 ENCOUNTER — Ambulatory Visit (INDEPENDENT_AMBULATORY_CARE_PROVIDER_SITE_OTHER): Payer: Medicare Other | Admitting: Pharmacist Clinician (PhC)/ Clinical Pharmacy Specialist

## 2015-08-19 ENCOUNTER — Telehealth: Payer: Self-pay | Admitting: Family Medicine

## 2015-08-19 DIAGNOSIS — Z7901 Long term (current) use of anticoagulants: Secondary | ICD-10-CM

## 2015-08-19 DIAGNOSIS — I480 Paroxysmal atrial fibrillation: Secondary | ICD-10-CM

## 2015-08-19 LAB — POCT INR: INR: 2.5

## 2015-08-19 MED ORDER — COLCHICINE 0.6 MG PO TABS: 0.3000 mg | ORAL_TABLET | Freq: Every day | ORAL | Status: DC

## 2015-08-19 NOTE — Telephone Encounter (Signed)
Pt is having gout flare up. Pt has some colchicine med that  expired 2013. [facility]. Please advise

## 2015-08-19 NOTE — Telephone Encounter (Signed)
Medication refilled

## 2015-08-20 ENCOUNTER — Ambulatory Visit: Payer: Medicare Other | Admitting: Cardiology

## 2015-08-26 ENCOUNTER — Ambulatory Visit (INDEPENDENT_AMBULATORY_CARE_PROVIDER_SITE_OTHER): Payer: Medicare Other | Admitting: Pharmacist Clinician (PhC)/ Clinical Pharmacy Specialist

## 2015-08-26 DIAGNOSIS — I480 Paroxysmal atrial fibrillation: Secondary | ICD-10-CM

## 2015-08-26 DIAGNOSIS — Z7901 Long term (current) use of anticoagulants: Secondary | ICD-10-CM

## 2015-08-26 LAB — POCT INR: INR: 2

## 2015-08-29 ENCOUNTER — Inpatient Hospital Stay (HOSPITAL_COMMUNITY)
Admission: EM | Admit: 2015-08-29 | Discharge: 2015-09-01 | DRG: 193 | Disposition: A | Discharge status: Home Health | Payer: Medicare Other | Attending: Internal Medicine | Admitting: Internal Medicine

## 2015-08-29 ENCOUNTER — Encounter (HOSPITAL_COMMUNITY): Payer: Self-pay | Admitting: Emergency Medicine

## 2015-08-29 ENCOUNTER — Emergency Department (HOSPITAL_COMMUNITY): Payer: Medicare Other

## 2015-08-29 DIAGNOSIS — E1129 Type 2 diabetes mellitus with other diabetic kidney complication: Secondary | ICD-10-CM | POA: Diagnosis present

## 2015-08-29 DIAGNOSIS — Z7901 Long term (current) use of anticoagulants: Secondary | ICD-10-CM

## 2015-08-29 DIAGNOSIS — R0609 Other forms of dyspnea: Secondary | ICD-10-CM

## 2015-08-29 DIAGNOSIS — I5043 Acute on chronic combined systolic (congestive) and diastolic (congestive) heart failure: Secondary | ICD-10-CM | POA: Diagnosis present

## 2015-08-29 DIAGNOSIS — N184 Chronic kidney disease, stage 4 (severe): Secondary | ICD-10-CM | POA: Diagnosis present

## 2015-08-29 DIAGNOSIS — Z91018 Allergy to other foods: Secondary | ICD-10-CM

## 2015-08-29 DIAGNOSIS — Z8673 Personal history of transient ischemic attack (TIA), and cerebral infarction without residual deficits: Secondary | ICD-10-CM

## 2015-08-29 DIAGNOSIS — Z833 Family history of diabetes mellitus: Secondary | ICD-10-CM

## 2015-08-29 DIAGNOSIS — E1122 Type 2 diabetes mellitus with diabetic chronic kidney disease: Secondary | ICD-10-CM | POA: Diagnosis present

## 2015-08-29 DIAGNOSIS — I4891 Unspecified atrial fibrillation: Secondary | ICD-10-CM

## 2015-08-29 DIAGNOSIS — I2510 Atherosclerotic heart disease of native coronary artery without angina pectoris: Secondary | ICD-10-CM | POA: Diagnosis present

## 2015-08-29 DIAGNOSIS — R0602 Shortness of breath: Secondary | ICD-10-CM | POA: Diagnosis not present

## 2015-08-29 DIAGNOSIS — E785 Hyperlipidemia, unspecified: Secondary | ICD-10-CM | POA: Diagnosis present

## 2015-08-29 DIAGNOSIS — Z8249 Family history of ischemic heart disease and other diseases of the circulatory system: Secondary | ICD-10-CM

## 2015-08-29 DIAGNOSIS — I10 Essential (primary) hypertension: Secondary | ICD-10-CM

## 2015-08-29 DIAGNOSIS — I130 Hypertensive heart and chronic kidney disease with heart failure and stage 1 through stage 4 chronic kidney disease, or unspecified chronic kidney disease: Secondary | ICD-10-CM | POA: Diagnosis present

## 2015-08-29 DIAGNOSIS — E039 Hypothyroidism, unspecified: Secondary | ICD-10-CM | POA: Diagnosis present

## 2015-08-29 DIAGNOSIS — J189 Pneumonia, unspecified organism: Secondary | ICD-10-CM | POA: Diagnosis not present

## 2015-08-29 DIAGNOSIS — Z87891 Personal history of nicotine dependence: Secondary | ICD-10-CM

## 2015-08-29 DIAGNOSIS — Y95 Nosocomial condition: Secondary | ICD-10-CM | POA: Diagnosis present

## 2015-08-29 DIAGNOSIS — I440 Atrioventricular block, first degree: Secondary | ICD-10-CM | POA: Diagnosis present

## 2015-08-29 DIAGNOSIS — Z79899 Other long term (current) drug therapy: Secondary | ICD-10-CM

## 2015-08-29 DIAGNOSIS — Z7984 Long term (current) use of oral hypoglycemic drugs: Secondary | ICD-10-CM

## 2015-08-29 DIAGNOSIS — D696 Thrombocytopenia, unspecified: Secondary | ICD-10-CM | POA: Diagnosis present

## 2015-08-29 LAB — I-STAT TROPONIN, ED: Troponin i, poc: 0.01 ng/mL (ref 0.00–0.08)

## 2015-08-29 LAB — BASIC METABOLIC PANEL
Anion gap: 8 (ref 5–15)
BUN: 79 mg/dL — AB (ref 6–20)
CHLORIDE: 114 mmol/L — AB (ref 101–111)
CO2: 18 mmol/L — ABNORMAL LOW (ref 22–32)
CREATININE: 4.64 mg/dL — AB (ref 0.61–1.24)
Calcium: 8.9 mg/dL (ref 8.9–10.3)
GFR calc Af Amer: 13 mL/min — ABNORMAL LOW (ref >60)
GFR calc non Af Amer: 11 mL/min — ABNORMAL LOW (ref >60)
GLUCOSE: 129 mg/dL — AB (ref 65–99)
Potassium: 4.4 mmol/L (ref 3.5–5.1)
SODIUM: 140 mmol/L (ref 135–145)

## 2015-08-29 LAB — CBC
HCT: 29.9 % — ABNORMAL LOW (ref 39.0–52.0)
Hemoglobin: 9.5 g/dL — ABNORMAL LOW (ref 13.0–17.0)
MCH: 30.5 pg (ref 26.0–34.0)
MCHC: 31.8 g/dL (ref 30.0–36.0)
MCV: 96.1 fL (ref 78.0–100.0)
PLATELETS: 123 10*3/uL — AB (ref 150–400)
RBC: 3.11 MIL/uL — ABNORMAL LOW (ref 4.22–5.81)
RDW: 15.8 % — AB (ref 11.5–15.5)
WBC: 2.9 10*3/uL — AB (ref 4.0–10.5)

## 2015-08-29 LAB — URINALYSIS, ROUTINE W REFLEX MICROSCOPIC
BILIRUBIN URINE: NEGATIVE
GLUCOSE, UA: NEGATIVE mg/dL
HGB URINE DIPSTICK: NEGATIVE
Ketones, ur: NEGATIVE mg/dL
LEUKOCYTES UA: NEGATIVE
NITRITE: NEGATIVE
PH: 5 (ref 5.0–8.0)
Protein, ur: 100 mg/dL — AB
SPECIFIC GRAVITY, URINE: 1.017 (ref 1.005–1.030)
Urobilinogen, UA: 0.2 mg/dL (ref 0.0–1.0)

## 2015-08-29 LAB — BRAIN NATRIURETIC PEPTIDE: B Natriuretic Peptide: 1483.1 pg/mL — ABNORMAL HIGH (ref 0.0–100.0)

## 2015-08-29 LAB — GLUCOSE, CAPILLARY
Glucose-Capillary: 107 mg/dL — ABNORMAL HIGH (ref 65–99)
Glucose-Capillary: 170 mg/dL — ABNORMAL HIGH (ref 65–99)

## 2015-08-29 LAB — PROTIME-INR
INR: 2.78 — AB (ref 0.00–1.49)
Prothrombin Time: 28.9 seconds — ABNORMAL HIGH (ref 11.6–15.2)

## 2015-08-29 LAB — URINE MICROSCOPIC-ADD ON

## 2015-08-29 MED ORDER — TAMSULOSIN HCL 0.4 MG PO CAPS
0.4000 mg | ORAL_CAPSULE | Freq: Every day | ORAL | Status: DC
  Administered 2015-08-29 – 2015-09-01 (×4): 0.4 mg via ORAL
  Filled 2015-08-29 (×4): qty 1

## 2015-08-29 MED ORDER — DEXTROSE 5 % IV SOLN
2.0000 g | INTRAVENOUS | Status: DC
  Administered 2015-08-29: 2 g via INTRAVENOUS
  Filled 2015-08-29: qty 2

## 2015-08-29 MED ORDER — VANCOMYCIN HCL IN DEXTROSE 1-5 GM/200ML-% IV SOLN
1000.0000 mg | INTRAVENOUS | Status: DC
  Administered 2015-08-29 – 2015-08-31 (×2): 1000 mg via INTRAVENOUS
  Filled 2015-08-29 (×2): qty 200

## 2015-08-29 MED ORDER — ROSUVASTATIN CALCIUM 20 MG PO TABS
20.0000 mg | ORAL_TABLET | Freq: Every day | ORAL | Status: DC
  Administered 2015-08-30 – 2015-09-01 (×3): 20 mg via ORAL
  Filled 2015-08-29 (×3): qty 1

## 2015-08-29 MED ORDER — ONDANSETRON HCL 4 MG/2ML IJ SOLN: 4.0000 mg | Freq: Four times a day (QID) | INTRAMUSCULAR | Status: DC | PRN

## 2015-08-29 MED ORDER — COLCHICINE 0.6 MG PO TABS: 0.3000 mg | ORAL_TABLET | Freq: Every day | ORAL | Status: DC | PRN

## 2015-08-29 MED ORDER — INSULIN ASPART 100 UNIT/ML ~~LOC~~ SOLN: 0.0000 [IU] | Freq: Every day | SUBCUTANEOUS | Status: DC

## 2015-08-29 MED ORDER — SODIUM CHLORIDE 0.9 % IJ SOLN
3.0000 mL | INTRAMUSCULAR | Status: DC | PRN
  Administered 2015-08-30 – 2015-08-31 (×3): 3 mL via INTRAVENOUS
  Filled 2015-08-29 (×3): qty 3

## 2015-08-29 MED ORDER — LEVOTHYROXINE SODIUM 175 MCG PO TABS
175.0000 ug | ORAL_TABLET | Freq: Every day | ORAL | Status: DC
  Administered 2015-08-30 – 2015-09-01 (×3): 175 ug via ORAL
  Filled 2015-08-29 (×3): qty 1

## 2015-08-29 MED ORDER — DEXTROSE 5 % IV SOLN: 2.0000 g | Freq: Three times a day (TID) | INTRAVENOUS | Status: DC

## 2015-08-29 MED ORDER — FUROSEMIDE 10 MG/ML IJ SOLN
60.0000 mg | Freq: Two times a day (BID) | INTRAMUSCULAR | Status: DC
Start: 2015-08-29 — End: 2015-09-01
  Administered 2015-08-29 – 2015-09-01 (×6): 60 mg via INTRAVENOUS
  Filled 2015-08-29 (×6): qty 6

## 2015-08-29 MED ORDER — ACETAMINOPHEN 325 MG PO TABS
650.0000 mg | ORAL_TABLET | ORAL | Status: DC | PRN
  Administered 2015-08-30: 650 mg via ORAL
  Filled 2015-08-29: qty 2

## 2015-08-29 MED ORDER — INSULIN ASPART 100 UNIT/ML ~~LOC~~ SOLN
0.0000 [IU] | Freq: Three times a day (TID) | SUBCUTANEOUS | Status: DC
  Administered 2015-08-30 (×2): 2 [IU] via SUBCUTANEOUS
  Administered 2015-08-31: 5 [IU] via SUBCUTANEOUS
  Administered 2015-08-31 – 2015-09-01 (×2): 2 [IU] via SUBCUTANEOUS

## 2015-08-29 MED ORDER — WARFARIN - PHARMACIST DOSING INPATIENT
Freq: Every day | Status: DC
  Administered 2015-08-29: 18:00:00

## 2015-08-29 MED ORDER — OLOPATADINE HCL 0.1 % OP SOLN
1.0000 [drp] | Freq: Two times a day (BID) | OPHTHALMIC | Status: DC
  Administered 2015-08-29 – 2015-09-01 (×6): 1 [drp] via OPHTHALMIC
  Filled 2015-08-29: qty 5

## 2015-08-29 MED ORDER — ISOSORBIDE MONONITRATE ER 60 MG PO TB24
60.0000 mg | ORAL_TABLET | Freq: Every day | ORAL | Status: DC
  Administered 2015-08-30 – 2015-09-01 (×3): 60 mg via ORAL
  Filled 2015-08-29 (×3): qty 1

## 2015-08-29 MED ORDER — VANCOMYCIN HCL IN DEXTROSE 1-5 GM/200ML-% IV SOLN
1000.0000 mg | INTRAVENOUS | Status: DC
  Filled 2015-08-29 (×2): qty 200

## 2015-08-29 MED ORDER — SODIUM CHLORIDE 0.9 % IV SOLN: 250.0000 mL | INTRAVENOUS | Status: DC | PRN

## 2015-08-29 MED ORDER — SODIUM CHLORIDE 0.9 % IJ SOLN
3.0000 mL | Freq: Two times a day (BID) | INTRAMUSCULAR | Status: DC
  Administered 2015-08-29 – 2015-09-01 (×3): 3 mL via INTRAVENOUS

## 2015-08-29 MED ORDER — FUROSEMIDE 10 MG/ML IJ SOLN
40.0000 mg | Freq: Once | INTRAMUSCULAR | Status: AC
  Administered 2015-08-29: 40 mg via INTRAVENOUS
  Filled 2015-08-29: qty 4

## 2015-08-29 MED ORDER — WARFARIN SODIUM 5 MG PO TABS
5.0000 mg | ORAL_TABLET | ORAL | Status: DC
  Administered 2015-08-29: 5 mg via ORAL
  Filled 2015-08-29: qty 1

## 2015-08-29 MED ORDER — CARVEDILOL 12.5 MG PO TABS
12.5000 mg | ORAL_TABLET | Freq: Two times a day (BID) | ORAL | Status: DC
  Administered 2015-08-29 – 2015-09-01 (×6): 12.5 mg via ORAL
  Filled 2015-08-29 (×6): qty 1

## 2015-08-29 MED ORDER — AMLODIPINE BESYLATE 10 MG PO TABS
10.0000 mg | ORAL_TABLET | Freq: Every day | ORAL | Status: DC
  Administered 2015-08-30 – 2015-09-01 (×3): 10 mg via ORAL
  Filled 2015-08-29 (×3): qty 1

## 2015-08-29 MED ORDER — WARFARIN SODIUM 2.5 MG PO TABS: 2.5000 mg | ORAL_TABLET | ORAL | Status: DC

## 2015-08-29 MED ORDER — DEXTROSE 5 % IV SOLN
1.0000 g | INTRAVENOUS | Status: DC
  Administered 2015-08-30 – 2015-08-31 (×2): 1 g via INTRAVENOUS
  Filled 2015-08-29 (×3): qty 1

## 2015-08-29 NOTE — ED Notes (Signed)
Pt c/o shortness of breath onset last night. Pt talking in complete sentences without difficulty.

## 2015-08-29 NOTE — ED Notes (Signed)
Pt to CXR.

## 2015-08-29 NOTE — ED Notes (Signed)
Attempted report X1

## 2015-08-29 NOTE — H&P (Signed)
[facility] History and Physical  [name] [name]:[id number] DOB: [birth year] DOA: 08/29/2015  Referring physician: Corlis [name] PCP: [name] Conch, MD   Chief Complaint: DOE  HPI: [name] [name] is a delightful 79 y.o. male the past medical history that includes CAD, CHF, recent pneumonia,hypertension, CVA, diabetes, chronic kidney disease stage IV presents to the emergency department with a chief complaint of worsening shortness of breath and dyspnea with exertion. Initial evaluation in the emergency department concerning for acute on chronic combined heart failure in the setting of healthcare associated pneumonia. Patient reports gradual worsening of shortness of breath over the last week or so. He states he has employment with his doctor tomorrow. He came to the emergency department today due to sudden worsening of dyspnea with exertion. "My breath just leaves me for bit". Associated symptoms include some abdominal tightness. Activity makes it worse inactivity makes it better. He denies chest pain palpitations headache dizziness syncope or near-syncope. He denies any fever chills cough nausea vomiting. He denies any dysuria hematuria frequency or urgency. He reports eating and drinking his normal amount with a steady weight. He reports he is on 80 mg of Lasix every day and has been compliant with this.  Workup in the emergency department includes a basic metabolic panel significant for a chloride of 114 CO2 of 18 serum glucose 129 creatinine 4.64 (which is his baseline). INR is 2.78,BNP 1483 initial troponin is negative urinalysis unremarkable. Chest x-ray right middle lobe dense consolidative opacity compatible with partial collapse versus consolidative pneumonia.Cardiomegaly with chronic vascular congestion recommend radiographic follow-up after medical therapy to document resolution.EKG sinus rhythm with 1st degree A-V block with Fusion complexes and Premature atrial complexes with  Abberant conduction rightward axis nonspecific T wave abnormality prolonged QT abnormal ECG  In the emergency department he is afebrile hemodynamically stable and not hypoxic. He received 40 mg Lasix IV antibiotics.   Review of Systems:  10 point review of systems complete and all systems are negative except as indicated in the history of present illness Past Medical History  Diagnosis Date  . CAD (coronary artery disease)     a. s/p PCI to mid and mid-distal RCA, 40% sten L main, no sig dz LCx  b. myoview 11/24/2014 inferoapex ischemia, medical management due to high risk for contrast nephropathy  . HTN (hypertension)   . Hyperlipidemia   . Cerebrovascular accident (HCC)   . CHF (congestive heart failure) (HCC)     EF 35% echo 2012 ; EF 44% myoview 2012; EF 76% by [facility] November 2014  . Diabetes mellitus   . 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 Unspecified hypothyroidism   . Gout   . Respiratory failure, acute (HCC) 07/21/2015   Past Surgical History  Procedure Laterality Date  . Thyroidectomy     Social History:  reports that he quit smoking about 61 years ago. His smoking use included Cigarettes. He smoked 0.50 packs per day. He does not have any smokeless tobacco history on file. He reports that he does not drink alcohol or use illicit drugs. Lives at home with his wife and his granddaughter. And rates without assistance some short-term memory issues independent with ADLs Allergies  Allergen Reactions  . Tomato Other (See Comments)    Too much acid - cannot take due to kidney issues    Family History  Problem Relation Age of Onset  . Kidney failure Mother   . Diabetes Mother   . Cancer  Father     lung  . Heart attack Brother     massive heart attack in 27s  . Hypertension Mother      Prior to Admission medications   Medication Sig Start Date End Date Taking? Authorizing Provider  acetaminophen (TYLENOL) 500 MG tablet Take 500 mg by mouth 2 (two) times  daily.    Yes Historical Provider, MD  amLODipine (NORVASC) 10 MG tablet Take 10 mg by mouth daily. 04/30/15  Yes Historical Provider, MD  calcitRIOL (ROCALTROL) 0.25 MCG capsule Take 0.25-0.5 mcg by mouth See admin instructions. Take 1 tablet ( 0.94mcg) on odd days and  2 tablets (0.50mcg) on even days as directed   Yes Historical Provider, MD  carvedilol (COREG) 12.5 MG tablet Take 1 tablet (12.5 mg total) by mouth 2 (two) times daily with a meal. 01/11/15  Yes [name] Patty, MD  [facility] Liver Oil OIL Take 15 mLs by mouth daily.   Yes Historical Provider, MD  colchicine 0.6 MG tablet Take 0.5 tablets (0.3 mg total) by mouth daily. Patient taking differently: Take 0.3 mg by mouth daily as needed (gout).  08/19/15  Yes [name], MD  furosemide (LASIX) 40 MG tablet TAKE 2 TABLETS (80 MG TOTAL) BY MOUTH DAILY. 07/19/15  Yes [name], MD  glyBURIDE (DIABETA) 5 MG tablet Take 1 tablet (5 mg total) by mouth daily with breakfast. Take a 1/2 pill before breakfast and a whole pill before 2nd meal of the day. Patient taking differently: Take 2.5-5 mg by mouth 2 (two) times daily with a meal. Take a 1/2 pill before breakfast and a whole pill before 2nd meal of the day. 04/20/15  Yes [name], MD  warfarin (COUMADIN) 5 MG tablet Take 1 tablet (5 mg total) by mouth daily. Patient taking differently: Take 2.5-5 mg by mouth daily. Take 1 tablet (5 mg) by mouth Sunday, Tuesday, Thursday, take 1/2 tablet (2.5 mg) on Monday, Wednesday, Friday and Saturday 07/23/15  Yes Calvert Cantor, MD  glipiZIDE (GLUCOTROL) 5 MG tablet Take 2.5-5 mg by mouth See admin instructions. Pt takes 2.5mg  in am before breakfast and  in pm before dinner 05/01/15   Historical Provider, MD  hydrALAZINE (APRESOLINE) 50 MG tablet Take 1.5 tablets (75 mg total) by mouth 3 (three) times daily. Patient taking differently: Take 100 mg by mouth 3 (three) times daily.  12/15/14   Laurey Morale, MD  isosorbide mononitrate (IMDUR) 60 MG  24 hr tablet Take 1 tablet (60 mg total) by mouth daily. 02/15/15   [name], MD  levothyroxine (SYNTHROID, LEVOTHROID) 175 MCG tablet Take 1 tablet (175 mcg total) by mouth daily. 08/13/15   [name], MD  nitroGLYCERIN (NITROSTAT) 0.4 MG SL tablet Place 1 tablet (0.4 mg total) under the tongue every 5 (five) minutes as needed for chest pain. Patient not taking: Reported on 08/13/2015 11/25/14 04/25/17  Azalee Course, PA  olopatadine (PATANOL) 0.1 % ophthalmic solution Place 1 drop into both eyes 2 (two) times daily. 04/20/15   [name], MD  rosuvastatin (CRESTOR) 20 MG tablet Take 1 tablet (20 mg total) by mouth daily. 03/12/15   [name], MD  tamsulosin (FLOMAX) 0.4 MG CAPS capsule Take 1 capsule (0.4 mg total) by mouth daily. 07/23/15   Calvert Cantor, MD   Physical Exam: Filed Vitals:   08/29/15 1415 08/29/15 1430 08/29/15 1445 08/29/15 1500  BP: 141/78 124/75 134/75 125/81  Pulse: 63 65 64 65  Temp:      TempSrc:      Resp: 26 19 21 15   Height:      Weight:      SpO2: 100% 96% 96% 100%    Wt Readings from Last 3 Encounters:  08/29/15 80.74 kg (178 lb)  08/13/15 84.823 kg (187 lb)  07/30/15 80.74 kg (178 lb)    General:  Appears calm and comfortable, quite pleasant Eyes: PERRL, normal lids, irises & conjunctiva ENT: grossly normal hearing, lips & tongue, his membranes of his mouth are moist and pink Neck: questionable JVD, masses or thyromegaly Cardiovascular: RRR, no m/r/g. No LE edema. Arm abdominal and low back tightness Telemetry: SR, no arrhythmias  Respiratory: mild increased work of breathing with conversation. Breath sounds with fine crackles on right otherwise only slightly diminished. Abdomen: somewhat tense nontender, positive bowel sounds no guarding Skin: no rash or induration seen on limited exam Musculoskeletal: grossly normal tone BUE/BLE Psychiatric: grossly normal mood and affect, speech fluent and appropriate Neurologic: grossly non-focal.  Speech clear facial symmetry          Labs on Admission:  Basic Metabolic Panel:  Recent Labs Lab 08/29/15 1130  NA 140  K 4.4  CL 114*  CO2 18*  GLUCOSE 129*  BUN 79*  CREATININE 4.64*  CALCIUM 8.9   Liver Function Tests: No results for input(s): AST, ALT, ALKPHOS, BILITOT, PROT, ALBUMIN in the last 168 hours. No results for input(s): LIPASE, AMYLASE in the last 168 hours. No results for input(s): AMMONIA in the last 168 hours. CBC:  Recent Labs Lab 08/29/15 1130  WBC 2.9*  HGB 9.5*  HCT 29.9*  MCV 96.1  PLT 123*   Cardiac Enzymes: No results for input(s): CKTOTAL, CKMB, CKMBINDEX, TROPONINI in the last 168 hours.  BNP (last 3 results)  Recent Labs  11/23/14 1532 12/10/14 0530 08/29/15 1235  BNP 592.3* 326.5* 1483.1*    ProBNP (last 3 results)  Recent Labs  09/03/14 1337  PROBNP 125.0*    CBG: No results for input(s): GLUCAP in the last 168 hours.  Radiological Exams on Admission: Dg Chest 2 View  08/29/2015  CLINICAL DATA:  Acute shortness of breath, history CHF EXAM: CHEST  2 VIEW COMPARISON:  07/22/2015 FINDINGS: Dense consolidative opacity in the right middle lobe obscures the right cardiac border compatible with pneumonia or partial collapse. Heart is enlarged. Chronic vascular congestion noted without definite CHF. No effusion or pneumothorax. Trachea midline. Atherosclerosis noted of the aorta. IMPRESSION: Right middle lobe dense consolidative opacity compatible with partial collapse versus consolidative pneumonia. Cardiomegaly with chronic vascular congestion Recommend radiographic follow-up after medical therapy to document resolution. Electronically Signed   By: Judie Petit.  Shick M.D.   On: 08/29/2015 12:08    EKG: Independently reviewedas noted above  Assessment/Plan Principal Problem:   DOE (dyspnea on exertion) Active Problems:   Type II diabetes mellitus with renal manifestations (HCC)   Essential hypertension   Atrial fibrillation  (HCC)   HCAP (healthcare-associated pneumonia)   Thrombocytopenia (HCC)  #1. Dyspnea with exertion. Likely multifactorial specifically healthcare associated pneumonia in the setting of mild acute on chronic combined heart failure per chest x-ray. Recent echo with an EF of 40%. Patient is afebrile no leukocytosis hemodynamically stable. Not hypoxic and improved after initial Lasix dose -Admit to telemetry -will follow pneumonia order set obtaining blood cultures urine for strep pneumo and Legionella -Antibiotics per protocol. Suspect we'll be able to narrow tomorrow -Of note patient had swallow eval last hospitalization and  was cleared -Oxygen supplementation as needed wean as able -recommend ambulating in the a.m. Documenting respiratory effort and oxygen saturation level. He may need home O2  #2. Acute on chronic combined heart failure. Recent echo with an EF of 40%.home medications include Lasix 80 mg daily. Has been compliant with medications -will provide IV Lasix for more aggressive diuresis. -Daily weights, intake and output -Not on ACE inhibitor due to chronic kidney disease. Continue beta blocker   #3. HCAP . See #1  #4. Atrial fibrillation. Patient is on Coumadin. Currently rate controlled -Coumadin per pharmacy -continue home meds  #5. Diabetes, appears controlled.on oral agents at home -Hemoglobin A1c. -will hold oral agents for now. Use sliding scale for optimal control  #6. Hypertension.fair control in the emergency department. Will continue home meds    Code Status: full DVT Prophylaxis: Family Communication: whole family Disposition Plan: home hopefully 24 hours  Time spent: 70 minutes  [facility] - GainesvilleBLACK,KAREN M [facility]    I have evaluated the patient, reviewed the chart, modified the above note and discussed the plan with Toya SmothersKaren Black, NP.  RML infiltrate on CXR but no cough. Recently had RLL pneumonia treated with Levaquin. Have started HCAP treatment. Has  mild pulmonary edema at best. Hopefully will be diuresed enough by tomorrow to allow him to go home. Would check pulse ox on exertion. Would recommend that PCP repeat CXR in 2 wks to ensure clearing. He had an SLP eval on his last admission and did not have dysphagia. Follow BP and decide on Hydralazine/ Nitrate combination prior to discharg for systolic CHF.   Calvert CantorSaima Janziel Hockett, MD Pager: [email or web address]

## 2015-08-29 NOTE — ED Notes (Signed)
O2 sats dropped to 95% during ambulation. EDP notified

## 2015-08-29 NOTE — ED Provider Notes (Signed)
CSN: [id number]     Arrival date & time 08/29/15  1110 History   First MD Initiated Contact with Patient 08/29/15 1202     Chief Complaint  Patient presents with  . Shortness of Breath     (Consider location/radiation/quality/duration/timing/severity/associated sxs/prior Treatment) HPI   Patient is a 79 year old male with past medical history is negative for CAD, hypertension, CHF with an EF of 35% diabetes CKD presenting with increasing shortness of breath.  Patient had increasing shortness breath over the last couple days. Patient is on 80 of Lasix every day. Patient was told increase it if he became short of breath. However he was so short of breath the patient felt unsafe at home and is here in the emergency department with his family. Patient had no unequal swelling of legs. No chest pain. No infectious symptoms such as fever, cough, congestion.   Past Medical History  Diagnosis Date  . CAD (coronary artery disease)     a. s/p PCI to mid and mid-distal RCA, 40% sten L main, no sig dz LCx  b. myoview 11/24/2014 inferoapex ischemia, medical management due to high risk for contrast nephropathy  . HTN (hypertension)   . Hyperlipidemia   . Cerebrovascular accident (HCC)   . CHF (congestive heart failure) (HCC)     EF 35% echo 2012 ; EF 44% myoview 2012; EF 76% by [facility] November 2014  . Diabetes mellitus   . 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 Unspecified hypothyroidism   . Gout   . Respiratory failure, acute (HCC) 07/21/2015   Past Surgical History  Procedure Laterality Date  . Thyroidectomy     Family History  Problem Relation Age of Onset  . Kidney failure Mother   . Diabetes Mother   . Cancer Father     lung  . Heart attack Brother     massive heart attack in 89s  . Hypertension Mother    Social History  Substance Use Topics  . Smoking status: Former Smoker -- 0.50 packs/day    Types: Cigarettes    Quit date: 10/16/1953  . Smokeless tobacco: None      Comment: quit in the 70's  . Alcohol Use: No    Review of Systems  Constitutional: Negative for fever and activity change.  HENT: Negative for congestion and hearing loss.   Eyes: Negative for discharge and redness.  Respiratory: Positive for shortness of breath. Negative for cough.   Cardiovascular: Negative for chest pain.  Gastrointestinal: Negative for abdominal pain.  Genitourinary: Negative for dysuria.  Musculoskeletal: Negative for arthralgias.  Allergic/Immunologic: Negative for immunocompromised state.  Neurological: Negative for speech difficulty.  Psychiatric/Behavioral: Negative for agitation.  All other systems reviewed and are negative.     Allergies  Tomato  Home Medications   Prior to Admission medications   Medication Sig Start Date End Date Taking? Authorizing Provider  carvedilol (COREG) 12.5 MG tablet Take 1 tablet (12.5 mg total) by mouth 2 (two) times daily with a meal. 01/11/15  Yes [name] Patty, MD  colchicine 0.6 MG tablet Take 0.5 tablets (0.3 mg total) by mouth daily. 08/19/15  Yes [name], MD  furosemide (LASIX) 40 MG tablet TAKE 2 TABLETS (80 MG TOTAL) BY MOUTH DAILY. 07/19/15  Yes [name], MD  glyBURIDE (DIABETA) 5 MG tablet Take 1 tablet (5 mg total) by mouth daily with breakfast. Take a 1/2 pill before breakfast and a whole pill before 2nd meal  of the day. Patient taking differently: Take 2.5-5 mg by mouth 2 (two) times daily with a meal. Take a 1/2 pill before breakfast and a whole pill before 2nd meal of the day. 04/20/15  Yes [name], MD  acetaminophen (TYLENOL) 500 MG tablet Take 500 mg by mouth 2 (two) times daily.     Historical Provider, MD  amLODipine (NORVASC) 10 MG tablet Take 10 mg by mouth daily. 04/30/15   Historical Provider, MD  calcitRIOL (ROCALTROL) 0.25 MCG capsule Take 0.25-0.5 mcg by mouth See admin instructions. Pt takes 0.72mcg on odd days and 0.34mcg on even days as directed    Historical Provider, MD   Cod Liver Oil OIL Take 15 mLs by mouth daily.    Historical Provider, MD  glipiZIDE (GLUCOTROL) 5 MG tablet Take 2.5-5 mg by mouth See admin instructions. Pt takes 2.5mg  in am before breakfast and  in pm before dinner 05/01/15   Historical Provider, MD  hydrALAZINE (APRESOLINE) 50 MG tablet Take 1.5 tablets (75 mg total) by mouth 3 (three) times daily. Patient taking differently: Take 100 mg by mouth 3 (three) times daily.  12/15/14   Laurey Morale, MD  isosorbide mononitrate (IMDUR) 60 MG 24 hr tablet Take 1 tablet (60 mg total) by mouth daily. 02/15/15   [name], MD  levothyroxine (SYNTHROID, LEVOTHROID) 175 MCG tablet Take 1 tablet (175 mcg total) by mouth daily. 08/13/15   [name], MD  nitroGLYCERIN (NITROSTAT) 0.4 MG SL tablet Place 1 tablet (0.4 mg total) under the tongue every 5 (five) minutes as needed for chest pain. Patient not taking: Reported on 08/13/2015 11/25/14 04/25/17  Azalee Course, PA  olopatadine (PATANOL) 0.1 % ophthalmic solution Place 1 drop into both eyes 2 (two) times daily. 04/20/15   [name], MD  rosuvastatin (CRESTOR) 20 MG tablet Take 1 tablet (20 mg total) by mouth daily. 03/12/15   [name], MD  tamsulosin (FLOMAX) 0.4 MG CAPS capsule Take 1 capsule (0.4 mg total) by mouth daily. 07/23/15   Calvert Cantor, MD  warfarin (COUMADIN) 5 MG tablet Take 1 tablet (5 mg total) by mouth daily. 07/23/15   Calvert Cantor, MD   BP 141/78 mmHg  Pulse 63  Temp(Src) 97.7 F (36.5 C) (Oral)  Resp 26  Ht  (1.753 m)  Wt 178 lb (80.74 kg)  BMI 26.27 kg/m2  SpO2 100% Physical Exam  Constitutional: He is oriented to person, place, and time. He appears well-nourished.  HENT:  Head: Normocephalic.  Mouth/Throat: Oropharynx is clear and moist.  edentulous  Eyes: Conjunctivae are normal.  Neck: No tracheal deviation present.  Cardiovascular: Normal rate.   Pulmonary/Chest: No stridor. He has no wheezes. He has no rales.  Tachypnic, SOB  Abdominal:  Soft. There is no tenderness. There is no guarding.  Musculoskeletal: Normal range of motion. He exhibits no edema.  Neurological: He is oriented to person, place, and time. No cranial nerve deficit.  Skin: Skin is warm and dry. No rash noted. He is not diaphoretic.  Psychiatric: He has a normal mood and affect. His behavior is normal.  Nursing note and vitals reviewed.   ED Course  Procedures (including critical care time) Labs Review Labs Reviewed  BASIC METABOLIC PANEL - Abnormal; Notable for the following:    Chloride 114 (*)    CO2 18 (*)    Glucose, Bld 129 (*)    BUN 79 (*)    Creatinine, Ser 4.64 (*)  GFR calc non Af Amer 11 (*)    GFR calc Af Amer 13 (*)    All other components within normal limits  CBC - Abnormal; Notable for the following:    WBC 2.9 (*)    RBC 3.11 (*)    Hemoglobin 9.5 (*)    HCT 29.9 (*)    RDW 15.8 (*)    Platelets 123 (*)    All other components within normal limits  BRAIN NATRIURETIC PEPTIDE - Abnormal; Notable for the following:    B Natriuretic Peptide 1483.1 (*)    All other components within normal limits  PROTIME-INR - Abnormal; Notable for the following:    Prothrombin Time 28.9 (*)    INR 2.78 (*)    All other components within normal limits  URINE CULTURE  URINALYSIS, ROUTINE W REFLEX MICROSCOPIC (NOT [facility])  I-STAT TROPOININ, ED    Imaging Review Dg Chest 2 View  08/29/2015  CLINICAL DATA:  Acute shortness of breath, history CHF EXAM: CHEST  2 VIEW COMPARISON:  07/22/2015 FINDINGS: Dense consolidative opacity in the right middle lobe obscures the right cardiac border compatible with pneumonia or partial collapse. Heart is enlarged. Chronic vascular congestion noted without definite CHF. No effusion or pneumothorax. Trachea midline. Atherosclerosis noted of the aorta. IMPRESSION: Right middle lobe dense consolidative opacity compatible with partial collapse versus consolidative pneumonia. Cardiomegaly with chronic vascular  congestion Recommend radiographic follow-up after medical therapy to document resolution. Electronically Signed   By: Judie PetitM.  Shick M.D.   On: 08/29/2015 12:08   I have personally reviewed and evaluated these images and lab results as part of my medical decision-making.   EKG Interpretation   Date/Time:  Sunday August 29 2015 11:15:02 EST Ventricular Rate:  72 PR Interval:  266 QRS Duration: 92 QT Interval:  438 QTC Calculation: 479 R Axis:   105 Text Interpretation:  Sinus rhythm with 1st degree A-V block with Fusion  complexes and Premature atrial complexes with Abberant conduction  Rightward axis Nonspecific T wave abnormality Prolonged QT Abnormal ECG ED  PHYSICIAN INTERPRETATION AVAILABLE IN [facility] no acute ischemia  with 1st degree A-V block Reconfirmed by Kandis MannanMACKUEN, COURTNEY ([id number]) on  08/29/2015 12:33:47 PM      MDM   Final diagnoses:  None    Patient is a 79 year old male with A. fib with EF of 40% followed by Dr. Jens Somrenshaw presenting today with acute on chronic shortness of breath. Patient thinks is likely from fluid overload. Patient is only mildlty tachypnic at rest.  No crackles on exam. We will get chest x-ray, BNP, EKG, troponin.  Patient appears stable in NAD. Will get ambulatory pulse ox.  2:18 PM PNA seen on xray.  BNP > 1000.  Will give IV lasix, treat for HCAP, admit.   Ramesha Poster Randall AnLyn Kajuan Guyton, MD 08/29/15 1418

## 2015-08-29 NOTE — Progress Notes (Signed)
Patient arrived in the unit at 3:41 pm accompanied by NT and daughter via bed. Vital sings taken and recorded. Denies any pain. No signs and symptoms of distress noted. Orientation given to the unit. Patient verbalizes understanding.

## 2015-08-29 NOTE — Progress Notes (Addendum)
ANTIBIOTIC CONSULT NOTE - INITIAL  Pharmacy Consult for vancomycin and ceftazidime Indication: HCAP  Allergies  Allergen Reactions  . Tomato     Too much acid    Patient Measurements: Height: 5\' 9"  (175.3 cm) Weight: 178 lb (80.74 kg) IBW/kg (Calculated) : 70.7   Vital Signs: Temp: 97.7 F (36.5 C) (11/13 1119) Temp Source: Oral (11/13 1119) BP: 141/78 mmHg (11/13 1415) Pulse Rate: 63 (11/13 1415) Intake/Output from previous day:   Intake/Output from this shift:    Labs:  Recent Labs  08/29/15 1130  WBC 2.9*  HGB 9.5*  PLT 123*  CREATININE 4.64*   Estimated Creatinine Clearance: 13.3 mL/min (by C-G formula based on Cr of 4.64). No results for input(s): VANCOTROUGH, VANCOPEAK, VANCORANDOM, GENTTROUGH, GENTPEAK, GENTRANDOM, TOBRATROUGH, TOBRAPEAK, TOBRARND, AMIKACINPEAK, AMIKACINTROU, AMIKACIN in the last 72 hours.   Microbiology: No results found for this or any previous visit (from the past 720 hour(s)).  Medical History: Past Medical History  Diagnosis Date  . CAD (coronary artery disease)     a. s/p PCI to mid and mid-distal RCA, 40% sten L main, no sig dz LCx  b. myoview 11/24/2014 inferoapex ischemia, medical management due to high risk for contrast nephropathy  . HTN (hypertension)   . Hyperlipidemia   . Cerebrovascular accident (HCC)   . CHF (congestive heart failure) (HCC)     EF 35% echo 2012 ; EF 44% [facility]iew 2012; [facility]  . Diabetes mellitus   . 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 Unspecified hypothyroidism   . Gout   . Respiratory failure, acute (HCC) 07/21/2015   Assessment: 79 yo M in ED with SOB.  Pharmacy consulted to dose vancomycin and ceftazidime for HCAP.  Pt w/ EF of 40% - likely volume overload/CHF exacerbation.   WBC low at 2.9, creat 4.64 - creat cl ~ 13 ml/min. Not on HD per chart notes.   AF, Wt 80.9 kg 11/13 CXR: RML dense consolidative opacity c/w partial collapse vs consolidative PNA   11/13  vanc>> 11/13 ceftaz>>  11/13 Ucx>>  Goal of Therapy:  Vancomycin trough level 15-20 mcg/ml  Plan: - ceftazidime 2 gm x 1 then 1gn IV q24hr - vancomycin 1 mg IV q48hr - f/u renal fxn, wbc, temp, culture data, clinical status - vanc levels as needed  Herby AbrahamMichelle T. Bell, Pharm.D. [phone] 08/29/2015 2:30 PM  Addendum  Pt was also on coumadin for afib. Admission INR was therapeutic at 2.78. It'll be cont while here.   Plan  Coumadin 2.5mg  PO qday except 5mg  TTSun Daily INR  Ulyses SouthwardMinh Pham, PharmD Pager: [phone] 08/29/2015 4:29 PM

## 2015-08-30 DIAGNOSIS — Z87891 Personal history of nicotine dependence: Secondary | ICD-10-CM | POA: Diagnosis not present

## 2015-08-30 DIAGNOSIS — I4891 Unspecified atrial fibrillation: Secondary | ICD-10-CM | POA: Diagnosis present

## 2015-08-30 DIAGNOSIS — Z91018 Allergy to other foods: Secondary | ICD-10-CM | POA: Diagnosis not present

## 2015-08-30 DIAGNOSIS — N184 Chronic kidney disease, stage 4 (severe): Secondary | ICD-10-CM | POA: Diagnosis present

## 2015-08-30 DIAGNOSIS — I130 Hypertensive heart and chronic kidney disease with heart failure and stage 1 through stage 4 chronic kidney disease, or unspecified chronic kidney disease: Secondary | ICD-10-CM | POA: Diagnosis present

## 2015-08-30 DIAGNOSIS — Z8249 Family history of ischemic heart disease and other diseases of the circulatory system: Secondary | ICD-10-CM | POA: Diagnosis not present

## 2015-08-30 DIAGNOSIS — R0602 Shortness of breath: Secondary | ICD-10-CM | POA: Diagnosis present

## 2015-08-30 DIAGNOSIS — I5043 Acute on chronic combined systolic (congestive) and diastolic (congestive) heart failure: Secondary | ICD-10-CM | POA: Diagnosis present

## 2015-08-30 DIAGNOSIS — I2510 Atherosclerotic heart disease of native coronary artery without angina pectoris: Secondary | ICD-10-CM | POA: Diagnosis present

## 2015-08-30 DIAGNOSIS — D696 Thrombocytopenia, unspecified: Secondary | ICD-10-CM | POA: Diagnosis present

## 2015-08-30 DIAGNOSIS — E039 Hypothyroidism, unspecified: Secondary | ICD-10-CM | POA: Diagnosis present

## 2015-08-30 DIAGNOSIS — Z8673 Personal history of transient ischemic attack (TIA), and cerebral infarction without residual deficits: Secondary | ICD-10-CM | POA: Diagnosis not present

## 2015-08-30 DIAGNOSIS — E785 Hyperlipidemia, unspecified: Secondary | ICD-10-CM | POA: Diagnosis present

## 2015-08-30 DIAGNOSIS — Z7984 Long term (current) use of oral hypoglycemic drugs: Secondary | ICD-10-CM | POA: Diagnosis not present

## 2015-08-30 DIAGNOSIS — Y95 Nosocomial condition: Secondary | ICD-10-CM | POA: Diagnosis present

## 2015-08-30 DIAGNOSIS — R0609 Other forms of dyspnea: Secondary | ICD-10-CM

## 2015-08-30 DIAGNOSIS — E1122 Type 2 diabetes mellitus with diabetic chronic kidney disease: Secondary | ICD-10-CM | POA: Diagnosis present

## 2015-08-30 DIAGNOSIS — Z833 Family history of diabetes mellitus: Secondary | ICD-10-CM | POA: Diagnosis not present

## 2015-08-30 DIAGNOSIS — Z7901 Long term (current) use of anticoagulants: Secondary | ICD-10-CM | POA: Diagnosis not present

## 2015-08-30 DIAGNOSIS — Z79899 Other long term (current) drug therapy: Secondary | ICD-10-CM | POA: Diagnosis not present

## 2015-08-30 DIAGNOSIS — J189 Pneumonia, unspecified organism: Secondary | ICD-10-CM | POA: Diagnosis present

## 2015-08-30 DIAGNOSIS — I440 Atrioventricular block, first degree: Secondary | ICD-10-CM | POA: Diagnosis present

## 2015-08-30 LAB — GLUCOSE, CAPILLARY
GLUCOSE-CAPILLARY: 75 mg/dL (ref 65–99)
GLUCOSE-CAPILLARY: 123 mg/dL — AB (ref 65–99)
GLUCOSE-CAPILLARY: 144 mg/dL — AB (ref 65–99)
GLUCOSE-CAPILLARY: 153 mg/dL — AB (ref 65–99)

## 2015-08-30 LAB — BASIC METABOLIC PANEL
ANION GAP: 11 (ref 5–15)
BUN: 77 mg/dL — ABNORMAL HIGH (ref 6–20)
CO2: 17 mmol/L — AB (ref 22–32)
Calcium: 9.1 mg/dL (ref 8.9–10.3)
Chloride: 114 mmol/L — ABNORMAL HIGH (ref 101–111)
Creatinine, Ser: 4.55 mg/dL — ABNORMAL HIGH (ref 0.61–1.24)
GFR calc non Af Amer: 11 mL/min — ABNORMAL LOW (ref >60)
GFR, EST AFRICAN AMERICAN: 13 mL/min — AB (ref >60)
GLUCOSE: 86 mg/dL (ref 65–99)
POTASSIUM: 4.8 mmol/L (ref 3.5–5.1)
Sodium: 142 mmol/L (ref 135–145)

## 2015-08-30 LAB — PROTIME-INR
INR: 3.15 — AB (ref 0.00–1.49)
Prothrombin Time: 31.8 seconds — ABNORMAL HIGH (ref 11.6–15.2)

## 2015-08-30 MED ORDER — WARFARIN SODIUM 1 MG PO TABS
1.0000 mg | ORAL_TABLET | Freq: Once | ORAL | Status: AC
  Administered 2015-08-30: 1 mg via ORAL
  Filled 2015-08-30: qty 1

## 2015-08-30 NOTE — Evaluation (Signed)
Clinical/Bedside Swallow Evaluation Patient Details  Name: Ernestine ConradRoy L Deschene MRN: [id number] Date of Birth: 06/18/1936  Today's Date: 08/30/2015 Time: SLP Start Time (ACUTE ONLY): 1522 SLP Stop Time (ACUTE ONLY): 1532 SLP Time Calculation (min) (ACUTE ONLY): 10 min  Past Medical History:  Past Medical History  Diagnosis Date  . CAD (coronary artery disease)     a. s/p PCI to mid and mid-distal RCA, 40% sten L main, no sig dz LCx  b. myoview 11/24/2014 inferoapex ischemia, medical management due to high risk for contrast nephropathy  . HTN (hypertension)   . Hyperlipidemia   . Cerebrovascular accident (HCC)   . CHF (congestive heart failure) (HCC)     EF 35% echo 2012 ; EF 44% myoview 2012; [facility]  . Diabetes mellitus   . 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 Unspecified hypothyroidism   . Gout   . Respiratory failure, acute (HCC) 07/21/2015   Past Surgical History:  Past Surgical History  Procedure Laterality Date  . Thyroidectomy     HPI:  Ernestine ConradRoy L Pflug is a delightful 79 y.o. male the past medical history that includes CAD, CHF, recent pneumonia,hypertension, CVA, diabetes, chronic kidney disease stage IV presents to the emergency department with a chief complaint of worsening shortness of breath and dyspnea with exertion. Initial evaluation in the emergency    Assessment / Plan / Recommendation Clinical Impression  Pt's oropharyngeal swallow appears within gross functional limits, although CXR is concerning for PNA and pt's dyspnea does put him at increased risk. Would continue with regular diet and thin liquids with brief f/u for tolerance.    Aspiration Risk  Mild aspiration risk    Diet Recommendation  Regular diet and thin liquids Intermittent supervision   Medication Administration: Whole meds with liquid    Other  Recommendations Oral Care Recommendations: Oral care BID   Follow up Recommendations  None    Frequency and  Duration min 1 x/week  1 week       Swallow Study   General HPI: Ernestine ConradRoy L Dibiasio is a delightful 79 y.o. male the past medical history that includes CAD, CHF, recent pneumonia,hypertension, CVA, diabetes, chronic kidney disease stage IV presents to the emergency department with a chief complaint of worsening shortness of breath and dyspnea with exertion. Initial evaluation in the emergency  Type of Study: Bedside Swallow Evaluation Previous Swallow Assessment: BSE during admission 07/2015 recommending regular diet and thin liquids, no SLP f/u needed Diet Prior to this Study: Regular;Thin liquids Temperature Spikes Noted: No Respiratory Status: Nasal cannula History of Recent Intubation: No Behavior/Cognition: Alert;Cooperative;Pleasant mood Oral Cavity Assessment: Within Functional Limits Oral Care Completed by SLP: No Oral Cavity - Dentition: Edentulous Vision: Functional for self-feeding Self-Feeding Abilities: Able to feed self Patient Positioning: Upright in bed Baseline Vocal Quality: Normal    Oral/Motor/Sensory Function Overall Oral Motor/Sensory Function: Within functional limits   Ice Chips Ice chips: Not tested   Thin Liquid Thin Liquid: Within functional limits Presentation: Self Fed;Straw    Nectar Thick Nectar Thick Liquid: Not tested   Honey Thick Honey Thick Liquid: Not tested   Puree Puree: Within functional limits Presentation: Spoon;Self Fed   Solid Solid: Within functional limits (given lack of dentition) Presentation: Self Fed      Maxcine HamLaura Paiewonsky, M.A. CCC-SLP [phone]  Maxcine Hamaiewonsky, Kealani Leckey 08/30/2015,3:58 PM

## 2015-08-30 NOTE — Progress Notes (Signed)
[facility] PROGRESS NOTE  [name] ConradRoy L [name] ONG:[id number] DOB: 08/11/1936 DOA: 08/29/2015 PCP: Tana ConchStephen Hunter, MD  Length of stay:    Assessment/Plan: Principal Problem:   DOE (dyspnea on exertion) Active Problems:   Type II diabetes mellitus with renal manifestations (HCC)   Essential hypertension   Atrial fibrillation (HCC)   HCAP (healthcare-associated pneumonia)   Thrombocytopenia (HCC)     #1. hcap/right middle lobe pneumonia. Likely multifactorial specifically healthcare associated pneumonia in the setting of mild acute on chronic combined heart failure per chest x-ray. Recent echo with an EF of 40%. Patient is afebrile no leukocytosis hemodynamically stable. Not hypoxic and improved after initial Lasix dose Continue telemetry -will follow pneumonia order set obtaining blood cultures urine for strep pneumo and Legionella Continue broad-spectrum antibiotics due to recent hospitalization and narrow down in the next 24 hours depending on blood culture results Plan of care discussed with the patient's daughter who was in the room  #2. Acute on chronic combined heart failure. Recent echo with an EF of 40%.home medications include Lasix 80 mg daily. Has been compliant with medications -will provide IV Lasix for more aggressive diuresis. -Daily weights, intake and output -Not on ACE inhibitor due to chronic kidney disease. Continue beta blocker   #3. HCAP . See #1  #4. Atrial fibrillation. Patient is on Coumadin. Currently rate controlled -Coumadin per pharmacy -continue home meds  #5. Diabetes, appears controlled.on oral agents at home -Hemoglobin A1c pending. -will hold oral agents for now. Use sliding scale for optimal control  #6. Hypertension.fair control in the emergency department. Will continue home meds     DVT prophylaxsis Coumadin  Code Status:      Code Status Orders        Start     Ordered   08/29/15 1607  Full code   Continuous      08/29/15 1606    Advance Directive Documentation        Most Recent Value   Type of Advance Directive  Living will, Healthcare Power of Attorney   Pre-existing out of facility DNR order (yellow form or pink MOST form)     "MOST" Form in Place?       Family Communication: family updated about patient's clinical progress Disposition Plan:  2-3 days   Brief narrative: 79 y.o. male the past medical history that includes CAD, CHF, recent pneumonia,hypertension, CVA, diabetes, chronic kidney disease stage IV presents to the emergency department with a chief complaint of worsening shortness of breath and dyspnea with exertion. Initial evaluation in the emergency department concerning for acute on chronic combined heart failure in the setting of healthcare associated pneumonia. Patient reports gradual worsening of shortness of breath over the last week or so. He states he has employment with his doctor tomorrow. He came to the emergency department today due to sudden worsening of dyspnea with exertion. "My breath just leaves me for bit". Associated symptoms include some abdominal tightness. Activity makes it worse inactivity makes it better. He denies chest pain palpitations headache dizziness syncope or near-syncope. He denies any fever chills cough nausea vomiting. He denies any dysuria hematuria frequency or urgency. He reports eating and drinking his normal amount with a steady weight. He reports he is on 80 mg of Lasix every day and has been compliant with this.  Workup in the emergency department includes a basic metabolic panel significant for a chloride of 114 CO2 of 18 serum glucose 129 creatinine 4.64 (which  is his baseline). INR is 2.78,BNP 1483 initial troponin is negative urinalysis unremarkable. Chest x-ray right middle lobe dense consolidative opacity compatible with partial collapse versus consolidative pneumonia.Cardiomegaly with chronic vascular congestion recommend radiographic follow-up  after medical therapy to document resolution.EKG sinus rhythm with 1st degree A-V block with Fusion complexes and Premature atrial complexes with Abberant conduction rightward axis nonspecific T wave abnormality prolonged QT abnormal ECG  In the emergency department he is afebrile hemodynamically stable and not hypoxic. He received 40 mg Lasix IV antibiotics.  Consultants:  None    Procedures:  NONE   Antibiotics: Anti-infectives    Start     Dose/Rate Route Frequency Ordered Stop   08/30/15 1600  cefTAZidime (FORTAZ) 1 g in dextrose 5 % 50 mL IVPB     1 g 100 mL/hr over 30 Minutes Intravenous Every 24 hours 08/29/15 1618     08/29/15 1700  vancomycin (VANCOCIN) IVPB 1000 mg/200 mL premix     1,000 mg 200 mL/hr over 60 Minutes Intravenous Every 48 hours 08/29/15 1624     08/29/15 1615  cefTAZidime (FORTAZ) 2 g in dextrose 5 % 50 mL IVPB  Status:  Discontinued     2 g 100 mL/hr over 30 Minutes Intravenous 3 times per day 08/29/15 1606 08/29/15 1617   08/29/15 1600  vancomycin (VANCOCIN) IVPB 1000 mg/200 mL premix  Status:  Discontinued     1,000 mg 200 mL/hr over 60 Minutes Intravenous Every 48 hours 08/29/15 1428 08/29/15 1606   08/29/15 1500  cefTAZidime (FORTAZ) 2 g in dextrose 5 % 50 mL IVPB  Status:  Discontinued     2 g 100 mL/hr over 30 Minutes Intravenous Every 24 hours 08/29/15 1416 08/29/15 1606         HPI/Subjective: Patient denies any shortness of breath, fever, some orthopnea   Objective: Filed Vitals:   08/30/15 0017 08/30/15 0537 08/30/15 0811 08/30/15 1020  BP: 102/58 115/55 134/61 105/62  Pulse: 65 70 78   Temp: 99 F (37.2 C) 98.4 F (36.9 C) 99.7 F (37.6 C)   TempSrc: Oral Oral Oral   Resp: Height:      Weight:  82.237 kg (181 lb 4.8 oz)    SpO2: 100% 99% 95%     Intake/Output Summary (Last 24 hours) at 08/30/15 1026 Last data filed at 08/30/15 0843  Gross per 24 hour  Intake   1280 ml  Output   1075 ml  Net    205 ml     Exam:  General: No acute respiratory distress Lungs: Clear to auscultation bilaterally without wheezes or crackles Cardiovascular: Regular rate and rhythm without murmur gallop or rub normal S1 and S2 Abdomen: Nontender, nondistended, soft, bowel sounds positive, no rebound, no ascites, no appreciable mass Extremities: No significant cyanosis, clubbing, or edema bilateral lower extremities     Data Review   Micro Results No results found for this or any previous visit (from the past 240 hour(s)).  Radiology Reports Dg Chest 2 View  08/29/2015  CLINICAL DATA:  Acute shortness of breath, history CHF EXAM: CHEST  2 VIEW COMPARISON:  07/22/2015 FINDINGS: Dense consolidative opacity in the right middle lobe obscures the right cardiac border compatible with pneumonia or partial collapse. Heart is enlarged. Chronic vascular congestion noted without definite CHF. No effusion or pneumothorax. Trachea midline. Atherosclerosis noted of the aorta. IMPRESSION: Right middle lobe dense consolidative opacity compatible with partial collapse versus consolidative pneumonia. Cardiomegaly with chronic vascular  congestion Recommend radiographic follow-up after medical therapy to document resolution. Electronically Signed   By: Judie Petit.  Shick M.D.   On: 08/29/2015 12:08     CBC  Recent Labs Lab 08/29/15 1130  WBC 2.9*  HGB 9.5*  HCT 29.9*  PLT 123*  MCV 96.1  MCH 30.5  MCHC 31.8  RDW 15.8*    Chemistries   Recent Labs Lab 08/29/15 1130 08/30/15 0438  NA 140 142  K 4.4 4.8  CL 114* 114*  CO2 18* 17*  GLUCOSE 129* 86  BUN 79* 77*  CREATININE 4.64* 4.55*  CALCIUM 8.9 9.1   ------------------------------------------------------------------------------------------------------------------ estimated creatinine clearance is 13.6 mL/min (by C-G formula based on Cr of 4.55). ------------------------------------------------------------------------------------------------------------------ No  results for input(s): HGBA1C in the last 72 hours. ------------------------------------------------------------------------------------------------------------------ No results for input(s): CHOL, HDL, LDLCALC, TRIG, CHOLHDL, LDLDIRECT in the last 72 hours. ------------------------------------------------------------------------------------------------------------------ No results for input(s): TSH, T4TOTAL, T3FREE, THYROIDAB in the last 72 hours.  Invalid input(s): FREET3 ------------------------------------------------------------------------------------------------------------------ No results for input(s): VITAMINB12, FOLATE, FERRITIN, TIBC, IRON, RETICCTPCT in the last 72 hours.  Coagulation profile  Recent Labs Lab 08/26/15 08/29/15 1230 08/30/15 0438  INR 2 2.78* 3.15*    No results for input(s): DDIMER in the last 72 hours.  Cardiac Enzymes No results for input(s): CKMB, TROPONINI, MYOGLOBIN in the last 168 hours.  Invalid input(s): CK ------------------------------------------------------------------------------------------------------------------ Invalid input(s): POCBNP   CBG:  Recent Labs Lab 08/29/15 1623 08/29/15 2131 08/30/15 0543  GLUCAP 107* 170* 75       Studies: Dg Chest 2 View  08/29/2015  CLINICAL DATA:  Acute shortness of breath, history CHF EXAM: CHEST  2 VIEW COMPARISON:  07/22/2015 FINDINGS: Dense consolidative opacity in the right middle lobe obscures the right cardiac border compatible with pneumonia or partial collapse. Heart is enlarged. Chronic vascular congestion noted without definite CHF. No effusion or pneumothorax. Trachea midline. Atherosclerosis noted of the aorta. IMPRESSION: Right middle lobe dense consolidative opacity compatible with partial collapse versus consolidative pneumonia. Cardiomegaly with chronic vascular congestion Recommend radiographic follow-up after medical therapy to document resolution. Electronically Signed   By:  Judie Petit.  Shick M.D.   On: 08/29/2015 12:08      Lab Results  Component Value Date   HGBA1C 6.4* 07/20/2015   HGBA1C 7.1* 01/19/2015   HGBA1C 7.7* 09/18/2014   Lab Results  Component Value Date   LDLCALC 60 01/19/2015   CREATININE 4.55* 08/30/2015       Scheduled Meds: . amLODipine  10 mg Oral Daily  . carvedilol  12.5 mg Oral BID WC  . cefTAZidime (FORTAZ)  IV  1 g Intravenous Q24H  . furosemide  60 mg Intravenous Q12H  . insulin aspart  0-15 Units Subcutaneous TID WC  . insulin aspart  0-5 Units Subcutaneous QHS  . isosorbide mononitrate  60 mg Oral Daily  . levothyroxine  175 mcg Oral Daily  . olopatadine  1 drop Both Eyes BID  . rosuvastatin  20 mg Oral Daily  . sodium chloride  3 mL Intravenous Q12H  . tamsulosin  0.4 mg Oral Daily  . vancomycin  1,000 mg Intravenous Q48H  . warfarin  1 mg Oral ONCE-1800  . Warfarin - Pharmacist Dosing Inpatient   Does not apply q1800   Continuous Infusions:   Principal Problem:   DOE (dyspnea on exertion) Active Problems:   Type II diabetes mellitus with renal manifestations (HCC)   Essential hypertension   Atrial fibrillation (HCC)   HCAP (healthcare-associated pneumonia)   Thrombocytopenia (HCC)  Time spent: 45 minutes   [facility]  [facility] Pager [phone]. If 7PM-7AM, please contact night-coverage at [email or web address], password [facility] 08/30/2015, 10:26 AM

## 2015-08-30 NOTE — Evaluation (Signed)
Physical Therapy Evaluation Patient Details Name: [name] [name] MRN: [id number] DOB: 02/09/1936 Today's Date: 08/30/2015   History of Present Illness  [name] [name] is a delightful 79 y.o. male the past medical history that includes CAD, CHF, recent pneumonia,hypertension, CVA, diabetes, chronic kidney disease stage IV presents to the emergency department with a chief complaint of worsening shortness of breath and dyspnea with exertion. Initial evaluation in the emergency department concerning for acute on chronic combined heart failure in the setting of healthcare associated pneumonia.  Clinical Impression  Pt admitted with above diagnosis. Pt currently with functional limitations due to the deficits listed below (see PT Problem List). Pt experiencing 2/4 DOE but O2 sats remaining stable in mid 90's with ambulation and pt seems to be at or near baseline level of function. Advised pt to ambulate in hall 5x/ day with family or staff. No acute PT indicated at this time, education completed.    PT signing off.     Follow Up Recommendations No PT follow up    Equipment Recommendations  None recommended by PT    Recommendations for Other Services       Precautions / Restrictions        Mobility  Bed Mobility               General bed mobility comments: pt received sitting EOB eating lunch  Transfers Overall transfer level: Modified independent Equipment used: Rolling walker (2 wheeled)             General transfer comment: pt stood safely without use of hands, no LOB  Ambulation/Gait Ambulation/Gait assistance: Modified independent (Device/Increase time) Ambulation Distance (Feet): 150 Feet Assistive device: Rolling walker (2 wheeled) Gait Pattern/deviations: Step-through pattern Gait velocity: decreased though family and pt report this is his baseline  Gait velocity interpretation: <1.8 ft/sec, indicative of risk for recurrent falls General Gait Details: pt  ambulated with RW for energy conservation, O2 sats remained in mid 90's on RA, DOE 2/4  Stairs            Wheelchair Mobility    Modified Rankin (Stroke Patients Only)       Balance Overall balance assessment: Needs assistance Sitting-balance support: No upper extremity supported Sitting balance-Leahy Scale: Normal     Standing balance support: No upper extremity supported Standing balance-Leahy Scale: Fair Standing balance comment: pt can maintain standing balance without support in static position but with DOE, pt prefers UE support with dynamic ativity                             Pertinent Vitals/Pain Pain Assessment: No/denies pain  O2 sats 94-96% on RA with and without exertion    Home Living Family/patient expects to be discharged to:: Private residence Living Arrangements: Spouse/significant other Available Help at Discharge: Family;Available 24 hours/day Type of Home: House Home Access: Stairs to enter Entrance Stairs-Rails: Left Entrance Stairs-Number of Steps: 2 Home Layout: One level Home Equipment: Walker - 2 wheels;Cane - quad      Prior Function Level of Independence: Independent with assistive device(s)         Comments: per pt he sometimes uses an assistive device, it depends on the day.       Hand Dominance   Dominant Hand: Right    Extremity/Trunk Assessment   Upper Extremity Assessment: Overall WFL for tasks assessed           Lower Extremity Assessment: Overall  WFL for tasks assessed;LLE deficits/detail   LLE Deficits / Details: pt reports OA left knee and that sometimes it bothers him and he needs RW but today was ok, strength equal to RLE  Cervical / Trunk Assessment: Normal  Communication   Communication: No difficulties  Cognition Arousal/Alertness: Awake/alert Behavior During Therapy: WFL for tasks assessed/performed Overall Cognitive Status: Within Functional Limits for tasks assessed                       General Comments General comments (skin integrity, edema, etc.): discussed ambulated 5x/ day with family or nursing staff and RW as needed.     Exercises        Assessment/Plan    PT Assessment Patent does not need any further PT services  PT Diagnosis Difficulty walking   PT Problem List    PT Treatment Interventions     PT Goals (Current goals can be found in the Care Plan section) Acute Rehab PT Goals Patient Stated Goal: return home PT Goal Formulation: All assessment and education complete, DC therapy    Frequency     Barriers to discharge        Co-evaluation               End of Session Equipment Utilized During Treatment: Gait belt Activity Tolerance: Patient tolerated treatment well Patient left: in bed;with call bell/phone within reach;with family/visitor present Nurse Communication: Mobility status    Functional Assessment Tool Used: clinical judgement Functional Limitation: Mobility: Walking and moving around Mobility: Walking and Moving Around Current Status ([id number]): At least 1 percent but less than 20 percent impaired, limited or restricted Mobility: Walking and Moving Around Goal Status [phone]): At least 1 percent but less than 20 percent impaired, limited or restricted Mobility: Walking and Moving Around Discharge Status [phone]): At least 1 percent but less than 20 percent impaired, limited or restricted    Time: 1256-1316 PT Time Calculation (min) (ACUTE ONLY): 20 min   Charges:   PT Evaluation $Initial PT Evaluation Tier I: 1 Procedure     PT G Codes:   PT G-Codes **NOT FOR INPATIENT CLASS** Functional Assessment Tool Used: clinical judgement Functional Limitation: Mobility: Walking and moving around Mobility: Walking and Moving Around Current Status (I6962): At least 1 percent but less than 20 percent impaired, limited or restricted Mobility: Walking and Moving Around Goal Status [phone]): At least 1 percent but less than 20  percent impaired, limited or restricted Mobility: Walking and Moving Around Discharge Status [phone]): At least 1 percent but less than 20 percent impaired, limited or restricted  Lyanne Co, PT  Acute Rehab Services  [phone]   Lyanne Co 08/30/2015, 1:30 PM

## 2015-08-30 NOTE — Progress Notes (Signed)
ANTICOAGULATION CONSULT NOTE - Follow Up Consult  Pharmacy Consult for Coumadin Indication: atrial fibrillation  Allergies  Allergen Reactions  . Tomato Other (See Comments)    Too much acid - cannot take due to kidney issues    Patient Measurements: Height: 5\' 9"  (175.3 cm) Weight: 181 lb 4.8 oz (82.237 kg) (a scale) IBW/kg (Calculated) : 70.7  Vital Signs: Temp: 99.7 F (37.6 C) (11/14 0811) Temp Source: Oral (11/14 0811) BP: 105/62 mmHg (11/14 1020) Pulse Rate: 78 (11/14 0811)  Labs:  Recent Labs  08/29/15 1130 08/29/15 1230 08/30/15 0438  HGB 9.5*  --   --   HCT 29.9*  --   --   PLT 123*  --   --   LABPROT  --  28.9* 31.8*  INR  --  2.78* 3.15*  CREATININE 4.64*  --  4.55*    Estimated Creatinine Clearance: 13.6 mL/min (by C-G formula based on Cr of 4.55).  Assessment:   INR up from 2.78 to 3.15 after usual Coumadin dose of 5 mg on 11/13.   Home Coumadin regimen: 2.5 mg MWFSat, 5 mg TTSun.   Low baseline Hgb and platelet count. No bleeding noted.  Goal of Therapy:  INR 2-3 Monitor platelets by anticoagulation protocol: Yes   Plan:    Reduce today's Coumadin dose to 1 mg x 1.   Daily PT/INR.  CBC in am.  Dennie FettersEgan, Audrena Talaga Donovan, RPh Pager: [phone] 08/30/2015,10:26 AM

## 2015-08-31 LAB — CBC
HCT: 28.9 % — ABNORMAL LOW (ref 39.0–52.0)
HEMOGLOBIN: 9 g/dL — AB (ref 13.0–17.0)
MCH: 29.6 pg (ref 26.0–34.0)
MCHC: 31.1 g/dL (ref 30.0–36.0)
MCV: 95.1 fL (ref 78.0–100.0)
PLATELETS: 117 10*3/uL — AB (ref 150–400)
RBC: 3.04 MIL/uL — ABNORMAL LOW (ref 4.22–5.81)
RDW: 15.5 % (ref 11.5–15.5)
WBC: 4.3 10*3/uL (ref 4.0–10.5)

## 2015-08-31 LAB — COMPREHENSIVE METABOLIC PANEL
ALK PHOS: 42 U/L (ref 38–126)
ALT: 17 U/L (ref 17–63)
ANION GAP: 9 (ref 5–15)
AST: 20 U/L (ref 15–41)
Albumin: 2.8 g/dL — ABNORMAL LOW (ref 3.5–5.0)
BUN: 75 mg/dL — ABNORMAL HIGH (ref 6–20)
CALCIUM: 8.5 mg/dL — AB (ref 8.9–10.3)
CO2: 18 mmol/L — AB (ref 22–32)
Chloride: 111 mmol/L (ref 101–111)
Creatinine, Ser: 4.71 mg/dL — ABNORMAL HIGH (ref 0.61–1.24)
GFR calc non Af Amer: 11 mL/min — ABNORMAL LOW (ref >60)
GFR, EST AFRICAN AMERICAN: 13 mL/min — AB (ref >60)
Glucose, Bld: 103 mg/dL — ABNORMAL HIGH (ref 65–99)
Potassium: 5 mmol/L (ref 3.5–5.1)
SODIUM: 138 mmol/L (ref 135–145)
TOTAL PROTEIN: 5.5 g/dL — AB (ref 6.5–8.1)
Total Bilirubin: 0.5 mg/dL (ref 0.3–1.2)

## 2015-08-31 LAB — URINE CULTURE

## 2015-08-31 LAB — PROTIME-INR
INR: 3.81 — ABNORMAL HIGH (ref 0.00–1.49)
PROTHROMBIN TIME: 36.7 s — AB (ref 11.6–15.2)

## 2015-08-31 LAB — GLUCOSE, CAPILLARY
GLUCOSE-CAPILLARY: 218 mg/dL — AB (ref 65–99)
Glucose-Capillary: 100 mg/dL — ABNORMAL HIGH (ref 65–99)
Glucose-Capillary: 126 mg/dL — ABNORMAL HIGH (ref 65–99)
Glucose-Capillary: 148 mg/dL — ABNORMAL HIGH (ref 65–99)

## 2015-08-31 MED ORDER — MENTHOL 3 MG MT LOZG
1.0000 | LOZENGE | OROMUCOSAL | Status: DC | PRN
  Administered 2015-08-31: 3 mg via ORAL
  Filled 2015-08-31: qty 9

## 2015-08-31 NOTE — Progress Notes (Signed)
Speech Language Pathology Treatment: Dysphagia  Patient Details Name: DREVION OFFORD MRN: [id number] DOB: 1935-10-30 Today's Date: 08/31/2015 Time: 0177-9390 SLP Time Calculation (min) (ACUTE ONLY): 12 min  Assessment / Plan / Recommendation Clinical Impression  Pt consumed regular textures and thin liquids by straw with no cueing needed and less SOB noted than during previous date. He reports some difficulty swallowing this morning when his throat was sore, but has no current complaints. Reviewed general aspiration precautions. No further SLP f/u indicated.   HPI HPI: QUINTAVIOUS RINCK is a delightful 79 y.o. male the past medical history that includes CAD, CHF, recent pneumonia,hypertension, CVA, diabetes, chronic kidney disease stage IV presents to the emergency department with a chief complaint of worsening shortness of breath and dyspnea with exertion. Initial evaluation in the emergency       SLP Plan  All goals met     Recommendations  Diet recommendations: Regular;Thin liquid Liquids provided via: Cup;Straw Medication Administration: Whole meds with liquid Supervision: Patient able to self feed;Intermittent supervision to cue for compensatory strategies Compensations: Slow rate;Small sips/bites Postural Changes and/or Swallow Maneuvers: Seated upright 90 degrees       Oral Care Recommendations: Oral care BID Follow up Recommendations: None Plan: All goals met   Germain Osgood, M.A. CCC-SLP [phone]  Germain Osgood 08/31/2015, 12:27 PM

## 2015-08-31 NOTE — Progress Notes (Signed)
[facility] PROGRESS NOTE  Ernestine ConradRoy L Makris ZOX:[id number] DOB: 09/23/1936 DOA: 08/29/2015 PCP: Tana ConchStephen Hunter, MD  Length of stay: 1   Assessment/Plan: Principal Problem:   DOE (dyspnea on exertion) Active Problems:   Type II diabetes mellitus with renal manifestations (HCC)   Essential hypertension   Atrial fibrillation (HCC)   HCAP (healthcare-associated pneumonia)   Thrombocytopenia (HCC)     #1. hcap/right middle lobe pneumonia. Patient has healthcare associated pneumonia in the setting of mild acute on chronic combined heart failure per chest x-ray. Recent echo with an EF of 40%. Patient is afebrile no leukocytosis hemodynamically stable. Not hypoxic and improved after initial Lasix dose Currently on 2 L of oxygen, telemetry showed V. tach last night   urine for strep pneumo and Legionella still needs to be collected Blood culture negative thus far Continue broad-spectrum antibiotics due to recent hospitalization and narrow down in the next 24 hours depending on blood culture results Anticipate discharge tomorrow if stable overnight  #2. Acute on chronic combined heart failure. Recent echo with an EF of 40%.home medications include Lasix 80 mg daily. Has been compliant with medications Continue Lasix 60 mg IV every 12 for more aggressive diuresis. -Daily weights, intake and output, still -Not on ACE inhibitor due to chronic kidney disease. Continue beta blocker   #3. HCAP . See #1  #4. Atrial fibrillation. Patient is on Coumadin. INR supratherapeutic -Coumadin per pharmacy Patient cannot discharge home until his INR is stable  #5. Diabetes, appears controlled.on oral agents at home -Hemoglobin A1c 6.4 on 07/20/15 Continue sliding scale insulin  #6. Hypertension.fair control in the emergency department. Will continue home meds     DVT prophylaxsis Coumadin  Code Status:      Code Status Orders        Start     Ordered   08/29/15 1607  Full code    Continuous     08/29/15 1606    Advance Directive Documentation        Most Recent Value   Type of Advance Directive  Living will, Healthcare Power of Attorney   Pre-existing out of facility DNR order (yellow form or pink MOST form)     "MOST" Form in Place?       Family Communication: family updated about patient's clinical progress Disposition Plan:  2-3 days   Brief narrative: 79 y.o. male the past medical history that includes CAD, CHF, recent pneumonia,hypertension, CVA, diabetes, chronic kidney disease stage IV presents to the emergency department with a chief complaint of worsening shortness of breath and dyspnea with exertion. Initial evaluation in the emergency department concerning for acute on chronic combined heart failure in the setting of healthcare associated pneumonia. Patient reports gradual worsening of shortness of breath over the last week or so. He states he has employment with his doctor tomorrow. He came to the emergency department today due to sudden worsening of dyspnea with exertion. "My breath just leaves me for bit". Associated symptoms include some abdominal tightness. Activity makes it worse inactivity makes it better. He denies chest pain palpitations headache dizziness syncope or near-syncope. He denies any fever chills cough nausea vomiting. He denies any dysuria hematuria frequency or urgency. He reports eating and drinking his normal amount with a steady weight. He reports he is on 80 mg of Lasix every day and has been compliant with this.  Workup in the emergency department includes a basic metabolic panel significant for a chloride of 114  CO2 of 18 serum glucose 129 creatinine 4.64 (which is his baseline). INR is 2.78,BNP 1483 initial troponin is negative urinalysis unremarkable. Chest x-ray right middle lobe dense consolidative opacity compatible with partial collapse versus consolidative pneumonia.Cardiomegaly with chronic vascular congestion recommend  radiographic follow-up after medical therapy to document resolution.EKG sinus rhythm with 1st degree A-V block with Fusion complexes and Premature atrial complexes with Abberant conduction rightward axis nonspecific T wave abnormality prolonged QT abnormal ECG  In the emergency department he is afebrile hemodynamically stable and not hypoxic. He received 40 mg Lasix IV antibiotics.  Consultants:  None    Procedures:  NONE   Antibiotics: Anti-infectives    Start     Dose/Rate Route Frequency Ordered Stop   08/30/15 1600  cefTAZidime (FORTAZ) 1 g in dextrose 5 % 50 mL IVPB     1 g 100 mL/hr over 30 Minutes Intravenous Every 24 hours 08/29/15 1618     08/29/15 1700  vancomycin (VANCOCIN) IVPB 1000 mg/200 mL premix     1,000 mg 200 mL/hr over 60 Minutes Intravenous Every 48 hours 08/29/15 1624     08/29/15 1615  cefTAZidime (FORTAZ) 2 g in dextrose 5 % 50 mL IVPB  Status:  Discontinued     2 g 100 mL/hr over 30 Minutes Intravenous 3 times per day 08/29/15 1606 08/29/15 1617   08/29/15 1600  vancomycin (VANCOCIN) IVPB 1000 mg/200 mL premix  Status:  Discontinued     1,000 mg 200 mL/hr over 60 Minutes Intravenous Every 48 hours 08/29/15 1428 08/29/15 1606   08/29/15 1500  cefTAZidime (FORTAZ) 2 g in dextrose 5 % 50 mL IVPB  Status:  Discontinued     2 g 100 mL/hr over 30 Minutes Intravenous Every 24 hours 08/29/15 1416 08/29/15 1606         HPI/Subjective: Patient denies any shortness of breath, fever, improving , sore throat    Objective: Filed Vitals:   08/30/15 1606 08/30/15 2020 08/30/15 2339 08/31/15 0622  BP: 130/66 102/50 109/62 120/85  Pulse: 73 67 65 77  Temp: 97.8 F (36.6 C) 99.4 F (37.4 C) 98.9 F (37.2 C) 98.7 F (37.1 C)  TempSrc: Oral Oral Oral Oral  Resp: 20 20 20 18   Height:      Weight:    83.598 kg (184 lb 4.8 oz)  SpO2: 99% 98% 97% 100%    Intake/Output Summary (Last 24 hours) at 08/31/15 0930 Last data filed at 08/31/15 0841  Gross per 24  hour  Intake   1250 ml  Output    575 ml  Net    675 ml    Exam:  General: No acute respiratory distress Lungs: Clear to auscultation bilaterally without wheezes or crackles Cardiovascular: Regular rate and rhythm without murmur gallop or rub normal S1 and S2 Abdomen: Nontender, nondistended, soft, bowel sounds positive, no rebound, no ascites, no appreciable mass Extremities: No significant cyanosis, clubbing, or edema bilateral lower extremities     Data Review   Micro Results Recent Results (from the past 240 hour(s))  Urine culture     Status: None (Preliminary result)   Collection Time: 08/29/15  1:53 PM  Result Value Ref Range Status   Specimen Description URINE, CLEAN CATCH  Final   Special Requests NONE  Final   Culture TOO YOUNG TO READ  Final   Report Status PENDING  Incomplete  Culture, blood (routine x 2) Call MD if unable to obtain prior to antibiotics being given  Status: None (Preliminary result)   Collection Time: 08/29/15  7:00 PM  Result Value Ref Range Status   Specimen Description BLOOD LEFT ANTECUBITAL  Final   Special Requests BOTTLES DRAWN AEROBIC AND ANAEROBIC 10CC  Final   Culture NO GROWTH < 24 HOURS  Final   Report Status PENDING  Incomplete  Culture, blood (routine x 2) Call MD if unable to obtain prior to antibiotics being given     Status: None (Preliminary result)   Collection Time: 08/29/15  7:05 PM  Result Value Ref Range Status   Specimen Description BLOOD LEFT ARM  Final   Special Requests BOTTLES DRAWN AEROBIC AND ANAEROBIC 10CC  Final   Culture NO GROWTH < 24 HOURS  Final   Report Status PENDING  Incomplete    Radiology Reports Dg Chest 2 View  08/29/2015  CLINICAL DATA:  Acute shortness of breath, history CHF EXAM: CHEST  2 VIEW COMPARISON:  07/22/2015 FINDINGS: Dense consolidative opacity in the right middle lobe obscures the right cardiac border compatible with pneumonia or partial collapse. Heart is enlarged. Chronic vascular  congestion noted without definite CHF. No effusion or pneumothorax. Trachea midline. Atherosclerosis noted of the aorta. IMPRESSION: Right middle lobe dense consolidative opacity compatible with partial collapse versus consolidative pneumonia. Cardiomegaly with chronic vascular congestion Recommend radiographic follow-up after medical therapy to document resolution. Electronically Signed   By: Judie Petit.  Shick M.D.   On: 08/29/2015 12:08     CBC  Recent Labs Lab 08/29/15 1130 08/31/15 0350  WBC 2.9* 4.3  HGB 9.5* 9.0*  HCT 29.9* 28.9*  PLT 123* 117*  MCV 96.1 95.1  MCH 30.5 29.6  MCHC 31.8 31.1  RDW 15.8* 15.5    Chemistries   Recent Labs Lab 08/29/15 1130 08/30/15 0438 08/31/15 0350  NA 140 142 138  K 4.4 4.8 5.0  CL 114* 114* 111  CO2 18* 17* 18*  GLUCOSE 129* 86 103*  BUN 79* 77* 75*  CREATININE 4.64* 4.55* 4.71*  CALCIUM 8.9 9.1 8.5*  AST  --   --  20  ALT  --   --  17  ALKPHOS  --   --  42  BILITOT  --   --  0.5   ------------------------------------------------------------------------------------------------------------------ estimated creatinine clearance is 13.1 mL/min (by C-G formula based on Cr of 4.71). ------------------------------------------------------------------------------------------------------------------ No results for input(s): HGBA1C in the last 72 hours. ------------------------------------------------------------------------------------------------------------------ No results for input(s): CHOL, HDL, LDLCALC, TRIG, CHOLHDL, LDLDIRECT in the last 72 hours. ------------------------------------------------------------------------------------------------------------------ No results for input(s): TSH, T4TOTAL, T3FREE, THYROIDAB in the last 72 hours.  Invalid input(s): FREET3 ------------------------------------------------------------------------------------------------------------------ No results for input(s): VITAMINB12, FOLATE, FERRITIN, TIBC,  IRON, RETICCTPCT in the last 72 hours.  Coagulation profile  Recent Labs Lab 08/26/15 08/29/15 1230 08/30/15 0438 08/31/15 0350  INR 2 2.78* 3.15* 3.81*    No results for input(s): DDIMER in the last 72 hours.  Cardiac Enzymes No results for input(s): CKMB, TROPONINI, MYOGLOBIN in the last 168 hours.  Invalid input(s): CK ------------------------------------------------------------------------------------------------------------------ Invalid input(s): POCBNP   CBG:  Recent Labs Lab 08/30/15 0543 08/30/15 1052 08/30/15 1701 08/30/15 2132 08/31/15 0734  GLUCAP 75 123* 144* 153* 100*       Studies: Dg Chest 2 View  08/29/2015  CLINICAL DATA:  Acute shortness of breath, history CHF EXAM: CHEST  2 VIEW COMPARISON:  07/22/2015 FINDINGS: Dense consolidative opacity in the right middle lobe obscures the right cardiac border compatible with pneumonia or partial collapse. Heart is enlarged. Chronic vascular congestion noted without  definite CHF. No effusion or pneumothorax. Trachea midline. Atherosclerosis noted of the aorta. IMPRESSION: Right middle lobe dense consolidative opacity compatible with partial collapse versus consolidative pneumonia. Cardiomegaly with chronic vascular congestion Recommend radiographic follow-up after medical therapy to document resolution. Electronically Signed   By: Judie Petit.  Shick M.D.   On: 08/29/2015 12:08      Lab Results  Component Value Date   HGBA1C 6.4* 07/20/2015   HGBA1C 7.1* 01/19/2015   HGBA1C 7.7* 09/18/2014   Lab Results  Component Value Date   LDLCALC 60 01/19/2015   CREATININE 4.71* 08/31/2015       Scheduled Meds: . amLODipine  10 mg Oral Daily  . carvedilol  12.5 mg Oral BID WC  . cefTAZidime (FORTAZ)  IV  1 g Intravenous Q24H  . furosemide  60 mg Intravenous Q12H  . insulin aspart  0-15 Units Subcutaneous TID WC  . insulin aspart  0-5 Units Subcutaneous QHS  . isosorbide mononitrate  60 mg Oral Daily  .  levothyroxine  175 mcg Oral Daily  . olopatadine  1 drop Both Eyes BID  . rosuvastatin  20 mg Oral Daily  . sodium chloride  3 mL Intravenous Q12H  . tamsulosin  0.4 mg Oral Daily  . vancomycin  1,000 mg Intravenous Q48H  . Warfarin - Pharmacist Dosing Inpatient   Does not apply q1800   Continuous Infusions:   Principal Problem:   DOE (dyspnea on exertion) Active Problems:   Type II diabetes mellitus with renal manifestations (HCC)   Essential hypertension   Atrial fibrillation (HCC)   HCAP (healthcare-associated pneumonia)   Thrombocytopenia (HCC)    Time spent: 45 minutes   [facility]  [facility] Pager [phone]. If 7PM-7AM, please contact night-coverage at [email or web address], password [facility] 08/31/2015, 9:30 AM  LOS: 1 day

## 2015-08-31 NOTE — Progress Notes (Signed)
ANTICOAGULATION + ANTIBIOTIC CONSULT NOTE - Follow Up Consult  Pharmacy Consult for Coumadin; Vancomycin and Ceftazidime Indication: atrial fibrillation;  pneumonia  Allergies  Allergen Reactions  . Tomato Other (See Comments)    Too much acid - cannot take due to kidney issues    Patient Measurements: Height: 5\' 9"  (175.3 cm) Weight: 184 lb 4.8 oz (83.598 kg) (scale a) IBW/kg (Calculated) : 70.7  Vital Signs: Temp: 98.7 F (37.1 C) (11/15 0622) Temp Source: Oral (11/15 0622) BP: 120/85 mmHg (11/15 0622) Pulse Rate: 77 (11/15 0622)  Labs:  Recent Labs  08/29/15 1130 08/29/15 1230 08/30/15 0438 08/31/15 0350  HGB 9.5*  --   --  9.0*  HCT 29.9*  --   --  28.9*  PLT 123*  --   --  117*  LABPROT  --  28.9* 31.8* 36.7*  INR  --  2.78* 3.15* 3.81*  CREATININE 4.64*  --  4.55* 4.71*    Estimated Creatinine Clearance: 13.1 mL/min (by C-G formula based on Cr of 4.71).  Assessment:   INR up to 3.81 today after usual Coumadin dose of 5 mg on 11/13 then reduced dose of 1 mg on 11/14 when INR was 3.12.    Home Coumadin regimen: 2.5 mg MWFSat, 5 mg TTSun.   Low baseline Hgb and platelet count; slight trend down. No bleeding reported.  Day # 3 Vancomycin and Fortaz for HCAP coverage. Urine culture with multiple species, blood cultures negative to date. No sputum culture.  Tmax 99.5, WBC 4.3.  Goal of Therapy:  INR 2-3 Monitor platelets by anticoagulation protocol: Yes  Vancomycin troughs 15-20 mcg/ml Appropriate Ceftazidime dose for renal function and infection   Plan:    No Coumadin today.   Daily PT/INR.     Continue Vancomycin 1 gm IV q48hrs - dose due at 6pm today.   Continue Ceftazidime 1 gm IV q24hrs.   Follow renal function, culture data. Progress.  Dennie FettersEgan, Yareth Kearse Donovan, RPh Pager: [phone] 08/31/2015,10:25 AM

## 2015-08-31 NOTE — Discharge Instructions (Signed)

## 2015-08-31 NOTE — Evaluation (Signed)
Occupational Therapy Evaluation Patient Details Name: [name] [name] MRN: [id number] DOB: 12/04/1935 Today's Date: 08/31/2015    History of Present Illness [name] [name] is a delightful 79 y.o. male the past medical history that includes CAD, CHF, recent pneumonia,hypertension, CVA, diabetes, chronic kidney disease stage IV presents to the emergency department with a chief complaint of worsening shortness of breath and dyspnea with exertion. Initial evaluation in the emergency department concerning for acute on chronic combined heart failure in the setting of healthcare associated pneumonia.   Clinical Impression   Pt is functioning at a modified independent level in ADL and mobility.  Educated in energy conservation and safety with ADL. Recommending pt avoid getting down in tub and using a shower seat, pt agreeable.  No further OT needs.    Follow Up Recommendations  No OT follow up    Equipment Recommendations  Tub/shower seat (pt has Medicaid secondary)   Recommendations for Other Services       Precautions / Restrictions        Mobility Bed Mobility Overal bed mobility: Modified Independent                Transfers Overall transfer level: Modified independent Equipment used: Rolling walker (2 wheeled)                  Balance     Sitting balance-Leahy Scale: Normal       Standing balance-Leahy Scale: Fair                              ADL Overall ADL's : Modified independent                                       General ADL Comments: Educated pt in energy conservation and safety with ADL. Recommended pt not get down in the tub and use shower seat instead, pt agreeable.     Vision     Perception     Praxis      Pertinent Vitals/Pain Pain Assessment: 0-10 Pain Score: 3  Pain Location: throat Pain Descriptors / Indicators: Sore Pain Intervention(s): Patient requesting pain meds-RN notified     Hand  Dominance Right   Extremity/Trunk Assessment Upper Extremity Assessment Upper Extremity Assessment: Overall WFL for tasks assessed   Lower Extremity Assessment Lower Extremity Assessment: Overall WFL for tasks assessed   Cervical / Trunk Assessment Cervical / Trunk Assessment: Normal   Communication Communication Communication: No difficulties   Cognition Arousal/Alertness: Awake/alert Behavior During Therapy: WFL for tasks assessed/performed Overall Cognitive Status: Within Functional Limits for tasks assessed                     General Comments       Exercises       Shoulder Instructions      Home Living Family/patient expects to be discharged to:: Private residence Living Arrangements: Spouse/significant other Available Help at Discharge: Family;Available 24 hours/day Type of Home: House Home Access: Stairs to enter Entergy CorporationEntrance Stairs-Number of Steps: 2 Entrance Stairs-Rails: Left Home Layout: One level     Bathroom Shower/Tub: Chief Strategy OfficerTub/shower unit   Bathroom Toilet: Standard     Home Equipment: Environmental consultantWalker - 2 wheels;Cane - quad;Hand held shower head          Prior Functioning/Environment Level of Independence: Independent with assistive device(s)  Comments: per pt he sometimes uses an assistive device, it depends on the day, gets down in the tub when he feels well      OT Diagnosis: Generalized weakness;Acute pain   OT Problem List:     OT Treatment/Interventions:      OT Goals(Current goals can be found in the care plan section) Acute Rehab OT Goals Patient Stated Goal: return home  OT Frequency:     Barriers to D/C:            Co-evaluation              End of Session Equipment Utilized During Treatment: Rolling walker Nurse Communication:  (ok to give pt sprite zero, pt with sore throat, DME needs)  Activity Tolerance: Patient tolerated treatment well Patient left: in bed;with call bell/phone within reach   Time:  0981-1914 OT Time Calculation (min): 17 min Charges:  OT General Charges $OT Visit: 1 Procedure OT Evaluation $Initial OT Evaluation Tier I: 1 Procedure G-Codes:    Evern Bio 08/31/2015, 9:09 AM  [phone]

## 2015-08-31 NOTE — Progress Notes (Signed)
Pt. Resting quietly throughout the night. No s/s of distress or discomfort noted. No c/o pain. Pt. With 5 beats of V-tach while asleep. [facility]. Blood pressure 120/85, pulse 77, temperature 98.7 F (37.1 C), temperature source Oral, resp. rate 18, height 5\' 9"  (1.753 m), weight 83.598 kg (184 lb 4.8 oz), SpO2 100 %. RN will continue to monitor pt. For changes in condition.Mykelti Goldenstein, Cheryll DessertKaren Cherrell

## 2015-09-01 ENCOUNTER — Telehealth: Payer: Self-pay | Admitting: Family Medicine

## 2015-09-01 LAB — BASIC METABOLIC PANEL
ANION GAP: 11 (ref 5–15)
BUN: 79 mg/dL — AB (ref 6–20)
CO2: 17 mmol/L — ABNORMAL LOW (ref 22–32)
CREATININE: 4.79 mg/dL — AB (ref 0.61–1.24)
Calcium: 8.4 mg/dL — ABNORMAL LOW (ref 8.9–10.3)
Chloride: 109 mmol/L (ref 101–111)
GFR, EST AFRICAN AMERICAN: 12 mL/min — AB (ref >60)
GFR, EST NON AFRICAN AMERICAN: 11 mL/min — AB (ref >60)
Glucose, Bld: 110 mg/dL — ABNORMAL HIGH (ref 65–99)
POTASSIUM: 5.2 mmol/L — AB (ref 3.5–5.1)
SODIUM: 137 mmol/L (ref 135–145)

## 2015-09-01 LAB — PROTIME-INR
INR: 2.52 — ABNORMAL HIGH (ref 0.00–1.49)
PROTHROMBIN TIME: 26.8 s — AB (ref 11.6–15.2)

## 2015-09-01 LAB — CBC
HCT: 28.1 % — ABNORMAL LOW (ref 39.0–52.0)
HEMOGLOBIN: 9 g/dL — AB (ref 13.0–17.0)
MCH: 30.2 pg (ref 26.0–34.0)
MCHC: 32 g/dL (ref 30.0–36.0)
MCV: 94.3 fL (ref 78.0–100.0)
Platelets: 112 10*3/uL — ABNORMAL LOW (ref 150–400)
RBC: 2.98 MIL/uL — ABNORMAL LOW (ref 4.22–5.81)
RDW: 15.3 % (ref 11.5–15.5)
WBC: 5.2 10*3/uL (ref 4.0–10.5)

## 2015-09-01 LAB — GLUCOSE, CAPILLARY
GLUCOSE-CAPILLARY: 96 mg/dL (ref 65–99)
GLUCOSE-CAPILLARY: 146 mg/dL — AB (ref 65–99)

## 2015-09-01 LAB — MRSA PCR SCREENING: MRSA by PCR: NEGATIVE

## 2015-09-01 MED ORDER — FUROSEMIDE 40 MG PO TABS: 60.0000 mg | ORAL_TABLET | Freq: Two times a day (BID) | ORAL | Status: DC

## 2015-09-01 MED ORDER — AMOXICILLIN-POT CLAVULANATE 875-125 MG PO TABS: 1.0000 | ORAL_TABLET | Freq: Two times a day (BID) | ORAL | Status: DC

## 2015-09-01 MED ORDER — AMOXICILLIN-POT CLAVULANATE 500-125 MG PO TABS
1.0000 | ORAL_TABLET | Freq: Two times a day (BID) | ORAL | Status: DC
  Administered 2015-09-01: 500 mg via ORAL
  Filled 2015-09-01 (×2): qty 1

## 2015-09-01 MED ORDER — TAMSULOSIN HCL 0.4 MG PO CAPS: 0.4000 mg | ORAL_CAPSULE | Freq: Every day | ORAL | Status: DC

## 2015-09-01 MED ORDER — AMOXICILLIN-POT CLAVULANATE 500-125 MG PO TABS: 1.0000 | ORAL_TABLET | Freq: Two times a day (BID) | ORAL | Status: DC

## 2015-09-01 MED ORDER — HYDRALAZINE HCL 50 MG PO TABS: 50.0000 mg | ORAL_TABLET | Freq: Three times a day (TID) | ORAL | Status: DC

## 2015-09-01 NOTE — Telephone Encounter (Signed)
[name] to be [facility] with in 3 days of discharge . She said she will speak with nurse and call back

## 2015-09-01 NOTE — Evaluation (Signed)
Physical Therapy Evaluation Patient Details Name: [name] [name] L Markes MRN: [id number] DOB: 03/18/1936 Today's Date: 09/01/2015   History of Present Illness  [name] [name] L Zacher is a delightful 79 y.o. male the past medical history that includes CAD, CHF, recent pneumonia,hypertension, CVA, diabetes, chronic kidney disease stage IV presents to the emergency department with a chief complaint of worsening shortness of breath and dyspnea with exertion. Initial evaluation in the emergency   Clinical Impression  Pt admitted with above diagnosis. Pt currently with functional limitations due to the deficits listed below (see PT Problem List). Pt could barely walk today even with RW due to gout pain.  Walked earlier in week with PT without problems.  Gout flare up limiting pts mobility.  Nurse made aware.  Will follow acutely.  Pt will benefit from skilled PT to increase their independence and safety with mobility to allow discharge to the venue listed below.      Follow Up Recommendations Home health PT;Supervision/Assistance - 24 hour    Equipment Recommendations  Rolling walker with 5" wheels    Recommendations for Other Services       Precautions / Restrictions Precautions Precautions: Fall Restrictions Weight Bearing Restrictions: No      Mobility  Bed Mobility               General bed mobility comments: sitting on EOB on arrival  Transfers Overall transfer level: Needs assistance Equipment used: Rolling walker (2 wheeled) Transfers: Sit to/from Stand Sit to Stand: Mod assist         General transfer comment: Pt needed mod asssist to perform sit to stand.  Knees very painful due to gout and could not straighten knees.   Ambulation/Gait Ambulation/Gait assistance: Mod assist Ambulation Distance (Feet): 4 Feet Assistive device: Rolling walker (2 wheeled) Gait Pattern/deviations: Step-through pattern;Decreased stride length;Antalgic;Staggering right;Staggering left;Wide base  of support;Trunk flexed   Gait velocity interpretation: Below normal speed for age/gender General Gait Details: Pt unable to take more than a few steps due to severe bil knee pain. Pt states he has not taken gout medicine since he was admitted.  Pt states he takes the med daily normally.  Pt had to step backward to bed and needed mod assist to prevent fall.    Stairs            Wheelchair Mobility    Modified Rankin (Stroke Patients Only)       Balance Overall balance assessment: Needs assistance Sitting-balance support: No upper extremity supported;Feet supported Sitting balance-Leahy Scale: Normal     Standing balance support: Bilateral upper extremity supported;During functional activity Standing balance-Leahy Scale: Poor Standing balance comment: relies on RW and cannot maintain balance even with RW secondaary to pain.                              Pertinent Vitals/Pain Pain Assessment: Faces Faces Pain Scale: Hurts whole lot Pain Location: bil knee pain due to gout per pt Pain Descriptors / Indicators: Aching;Sore Pain Intervention(s): Limited activity within patient's tolerance;Monitored during session;Repositioned  VSS with O2 sats 93% and > with activity.     Home Living Family/patient expects to be discharged to:: Private residence Living Arrangements: Spouse/significant other Available Help at Discharge: Family;Available 24 hours/day Type of Home: House Home Access: Stairs to enter Entrance Stairs-Rails: Left Entrance Stairs-Number of Steps: 2 Home Layout: One level Home Equipment: Cane - quad;Hand held shower head  Prior Function Level of Independence: Independent with assistive device(s)         Comments: per pt he sometimes uses an assistive device, it depends on the day, gets down in the tub when he feels well       Hand Dominance   Dominant Hand: Right    Extremity/Trunk Assessment   Upper Extremity Assessment: Defer to OT  evaluation           Lower Extremity Assessment: RLE deficits/detail;LLE deficits/detail      Cervical / Trunk Assessment: Normal  Communication   Communication: No difficulties  Cognition Arousal/Alertness: Awake/alert Behavior During Therapy: WFL for tasks assessed/performed Overall Cognitive Status: Within Functional Limits for tasks assessed                      General Comments General comments (skin integrity, edema, etc.): Pt wants to go home today despite gout flareup and states that his son can get him in house.  THis PT expressed concernes to pt and nurse as pt cannot ambulate at this time.  Nurse to f/u with MD regarding gout medicine and how to proceed.      Exercises        Assessment/Plan    PT Assessment Patient needs continued PT services  PT Diagnosis Difficulty walking   PT Problem List Decreased activity tolerance;Decreased balance;Decreased mobility;Decreased knowledge of use of DME;Decreased safety awareness;Decreased knowledge of precautions;Pain  PT Treatment Interventions DME instruction;Gait training;Functional mobility training;Therapeutic activities;Therapeutic exercise;Balance training;Patient/family education;Stair training   PT Goals (Current goals can be found in the Care Plan section) Acute Rehab PT Goals Patient Stated Goal: return home PT Goal Formulation: With patient Time For Goal Achievement: 09/08/15 Potential to Achieve Goals: Good    Frequency Min 3X/week   Barriers to discharge        Co-evaluation               End of Session Equipment Utilized During Treatment: Gait belt Activity Tolerance: Patient limited by pain (gout bil knees) Patient left: in bed;with call bell/phone within reach;with nursing/sitter in room;with bed alarm set Nurse Communication: Mobility status         Time: 9528-4132 PT Time Calculation (min) (ACUTE ONLY): 12 min   Charges:   PT Evaluation $Initial PT Evaluation Tier I: 1  Procedure     PT G CodesBerline Lopes 09/05/2015, 3:21 PM [facility] Roszak [facility] Acute [facility] [phone] (pager)

## 2015-09-01 NOTE — Care Management Note (Signed)
Case Management Note  Patient Details  Name: [name] [name] MRN: [id number] Date of Birth: 05/20/1936             Action/Plan: [facility]. [facility] also rolling walker ordered to be delivered to the room today prior to dc home.  Expected Discharge Date:     09/01/2015             Expected Discharge Plan:  Home w Home Health Services  Discharge planning Services  CM Consult    Choice offered to:  Patient     HH Arranged:  RN, PT, OT, Nurse's Aide HH Agency:  [facility]  Status of Service:  In process, will continue to follow  Reola MosherChandler, Lamaj Metoyer L, RN,MHA,BSN [phone] 09/01/2015, 3:18 PM

## 2015-09-01 NOTE — Telephone Encounter (Signed)
We need to make TCM call on this patient. Appointment on 22nd.

## 2015-09-01 NOTE — Discharge Summary (Signed)
Physician Discharge Summary  [name] [name] MRN: [id number] DOB/AGE: [birth year] 77 y.o.  PCP: Garret Reddish, MD   Admit date: 08/29/2015 Discharge date: 09/01/2015  Discharge Diagnoses:     Principal Problem:   DOE (dyspnea on exertion) Active Problems:   Type II diabetes mellitus with renal manifestations (HCC)   Essential hypertension   Atrial fibrillation (HCC)   HCAP (healthcare-associated pneumonia)   Thrombocytopenia (HCC)  CK D stage IV Acute on chronic systolic/diastolic heart failure   Follow-up recommendations Follow-up with PCP in 3-5 days , including all  additional recommended appointments as below Follow-up CBC, CMP, magnesium in 3-5 days Patient will need repeat INR on 11/18     Medication List    TAKE these medications        acetaminophen 500 MG tablet  Commonly known as:  TYLENOL  Take 500 mg by mouth 2 (two) times daily as needed (pain).     amLODipine 10 MG tablet  Commonly known as:  NORVASC  Take 10 mg by mouth daily.     amoxicillin-clavulanate 500-125 MG tablet  Commonly known as:  AUGMENTIN  Take 1 tablet (500 mg total) by mouth every 12 (twelve) hours.     calcitRIOL 0.25 MCG capsule  Commonly known as:  ROCALTROL  Take 0.25-0.5 mcg by mouth daily. Take 1 tablet ( 0.86mcg) on odd days and  2 tablets (0.44mcg) on even days as directed     carvedilol 12.5 MG tablet  Commonly known as:  COREG  Take 1 tablet (12.5 mg total) by mouth 2 (two) times daily with a meal.     Cod Liver Oil Oil  Take 15 mLs by mouth daily.     colchicine 0.6 MG tablet  Take 0.5 tablets (0.3 mg total) by mouth daily.     furosemide 40 MG tablet  Commonly known as:  LASIX  Take 1.5 tablets (60 mg total) by mouth 2 (two) times daily.     glipiZIDE 5 MG tablet  Commonly known as:  GLUCOTROL  Take 2.5-5 mg by mouth 2 (two) times daily before a meal. Take 1/2 tablet (2.5 mg) daily before breakfast and 1 tablet (5 mg) before supper     hydrALAZINE 50 MG  tablet  Commonly known as:  APRESOLINE  Take 1 tablet (50 mg total) by mouth 3 (three) times daily.     isosorbide mononitrate 60 MG 24 hr tablet  Commonly known as:  IMDUR  Take 1 tablet (60 mg total) by mouth daily.     levothyroxine 175 MCG tablet  Commonly known as:  SYNTHROID, LEVOTHROID  Take 1 tablet (175 mcg total) by mouth daily.     Magnesium 250 MG Tabs  Take 250 mg by mouth daily.     nitroGLYCERIN 0.4 MG SL tablet  Commonly known as:  NITROSTAT  Place 1 tablet (0.4 mg total) under the tongue every 5 (five) minutes as needed for chest pain.     olopatadine 0.1 % ophthalmic solution  Commonly known as:  PATANOL  Place 1 drop into both eyes 2 (two) times daily.     rosuvastatin 20 MG tablet  Commonly known as:  CRESTOR  Take 1 tablet (20 mg total) by mouth daily.     tamsulosin 0.4 MG Caps capsule  Commonly known as:  FLOMAX  Take 1 capsule (0.4 mg total) by mouth daily.     warfarin 5 MG tablet  Commonly known as:  COUMADIN  Take 1 tablet (5 mg  total) by mouth daily.         Discharge Condition: *Stable Discharge Instructions     Allergies  Allergen Reactions  . Tomato Other (See Comments)    Too much acid - cannot take due to kidney issues      Disposition: 01-Home or Self Care   Consults: *None     Significant Diagnostic Studies:  Dg Chest 2 View  08/29/2015  CLINICAL DATA:  Acute shortness of breath, history CHF EXAM: CHEST  2 VIEW COMPARISON:  07/22/2015 FINDINGS: Dense consolidative opacity in the right middle lobe obscures the right cardiac border compatible with pneumonia or partial collapse. Heart is enlarged. Chronic vascular congestion noted without definite CHF. No effusion or pneumothorax. Trachea midline. Atherosclerosis noted of the aorta. IMPRESSION: Right middle lobe dense consolidative opacity compatible with partial collapse versus consolidative pneumonia. Cardiomegaly with chronic vascular congestion Recommend radiographic  follow-up after medical therapy to document resolution. Electronically Signed   By: Jerilynn Mages.  Shick M.D.   On: 08/29/2015 12:08    Echocardiogram LV EF: 40% -  45%  ------------------------------------------------------------------- Indications:   CHF - 428.0.  ------------------------------------------------------------------- History:  PMH: Ischemic cardiomyopathy. Angina pectoris. Stroke. Risk factors: Hypertension. Diabetes mellitus. Dyslipidemia.  ------------------------------------------------------------------- Study Conclusions  - Left ventricle: The cavity size was normal. Wall thickness was increased in a pattern of mild LVH. Systolic function was mildly to moderately reduced. The estimated ejection fraction was in the range of 40% to 45%. Severe hypokinesis of the inferior and inferoseptal myocardium. Features are consistent with a pseudonormal left ventricular filling pattern, with concomitant abnormal relaxation and increased filling pressure (grade 2 diastolic dysfunction). - Aortic valve: There was trivial regurgitation. - Mitral valve: There was moderate regurgitation directed centrally. - Left atrium: The atrium was moderately dilated. - Tricuspid valve: There was regurgitation directed centrally. There was mild-moderate perivalvular regurgitation. - Pulmonary arteries: Systolic pressure was moderately increased. PA peak pressure: 56 mm Hg (S).   Filed Weights   08/30/15 0537 08/31/15 0622 09/01/15 0454  Weight: 82.237 kg (181 lb 4.8 oz) 83.598 kg (184 lb 4.8 oz) 84.596 kg (186 lb 8 oz)     Microbiology: Recent Results (from the past 240 hour(s))  Urine culture     Status: None   Collection Time: 08/29/15  1:53 PM  Result Value Ref Range Status   Specimen Description URINE, CLEAN CATCH  Final   Special Requests NONE  Final   Culture MULTIPLE SPECIES PRESENT, SUGGEST RECOLLECTION  Final   Report Status 08/31/2015 FINAL  Final   Culture, blood (routine x 2) Call MD if unable to obtain prior to antibiotics being given     Status: None (Preliminary result)   Collection Time: 08/29/15  7:00 PM  Result Value Ref Range Status   Specimen Description BLOOD LEFT ANTECUBITAL  Final   Special Requests BOTTLES DRAWN AEROBIC AND ANAEROBIC 10CC  Final   Culture NO GROWTH 2 DAYS  Final   Report Status PENDING  Incomplete  Culture, blood (routine x 2) Call MD if unable to obtain prior to antibiotics being given     Status: None (Preliminary result)   Collection Time: 08/29/15  7:05 PM  Result Value Ref Range Status   Specimen Description BLOOD LEFT ARM  Final   Special Requests BOTTLES DRAWN AEROBIC AND ANAEROBIC 10CC  Final   Culture NO GROWTH 2 DAYS  Final   Report Status PENDING  Incomplete       Blood Culture    Component Value Date/Time  SDES BLOOD LEFT ARM 08/29/2015 1905   SPECREQUEST BOTTLES DRAWN AEROBIC AND ANAEROBIC 10CC 08/29/2015 1905   CULT NO GROWTH 2 DAYS 08/29/2015 1905   REPTSTATUS PENDING 08/29/2015 1905      Labs: Results for orders placed or performed during the hospital encounter of 08/29/15 (from the past 48 hour(s))  Glucose, capillary     Status: Abnormal   Collection Time: 08/30/15 10:52 AM  Result Value Ref Range   Glucose-Capillary 123 (H) 65 - 99 mg/dL   Comment 1 Notify RN   Glucose, capillary     Status: Abnormal   Collection Time: 08/30/15  5:01 PM  Result Value Ref Range   Glucose-Capillary 144 (H) 65 - 99 mg/dL   Comment 1 Notify RN   Glucose, capillary     Status: Abnormal   Collection Time: 08/30/15  9:32 PM  Result Value Ref Range   Glucose-Capillary 153 (H) 65 - 99 mg/dL  Protime-INR     Status: Abnormal   Collection Time: 08/31/15  3:50 AM  Result Value Ref Range   Prothrombin Time 36.7 (H) 11.6 - 15.2 seconds   INR 3.81 (H) 0.00 - 1.49  CBC     Status: Abnormal   Collection Time: 08/31/15  3:50 AM  Result Value Ref Range   WBC 4.3 4.0 - 10.5 K/uL   RBC 3.04  (L) 4.22 - 5.81 MIL/uL   Hemoglobin 9.0 (L) 13.0 - 17.0 g/dL   HCT 28.9 (L) 39.0 - 52.0 %   MCV 95.1 78.0 - 100.0 fL   MCH 29.6 26.0 - 34.0 pg   MCHC 31.1 30.0 - 36.0 g/dL   RDW 15.5 11.5 - 15.5 %   Platelets 117 (L) 150 - 400 K/uL    Comment: PLATELET COUNT CONFIRMED BY SMEAR  Comprehensive metabolic panel     Status: Abnormal   Collection Time: 08/31/15  3:50 AM  Result Value Ref Range   Sodium 138 135 - 145 mmol/L   Potassium 5.0 3.5 - 5.1 mmol/L   Chloride 111 101 - 111 mmol/L   CO2 18 (L) 22 - 32 mmol/L   Glucose, Bld 103 (H) 65 - 99 mg/dL   BUN 75 (H) 6 - 20 mg/dL   Creatinine, Ser 4.71 (H) 0.61 - 1.24 mg/dL   Calcium 8.5 (L) 8.9 - 10.3 mg/dL   Total Protein 5.5 (L) 6.5 - 8.1 g/dL   Albumin 2.8 (L) 3.5 - 5.0 g/dL   AST 20 15 - 41 U/L   ALT 17 17 - 63 U/L   Alkaline Phosphatase 42 38 - 126 U/L   Total Bilirubin 0.5 0.3 - 1.2 mg/dL   GFR calc non Af Amer 11 (L) >60 mL/min   GFR calc Af Amer 13 (L) >60 mL/min    Comment: (NOTE) The eGFR has been calculated using the CKD EPI equation. This calculation has not been validated in all clinical situations. eGFR's persistently <60 mL/min signify possible Chronic Kidney Disease.    Anion gap 9 5 - 15  Glucose, capillary     Status: Abnormal   Collection Time: 08/31/15  7:34 AM  Result Value Ref Range   Glucose-Capillary 100 (H) 65 - 99 mg/dL   Comment 1 Notify RN    Comment 2 Document in Chart   Glucose, capillary     Status: Abnormal   Collection Time: 08/31/15 11:20 AM  Result Value Ref Range   Glucose-Capillary 126 (H) 65 - 99 mg/dL   Comment 1 Notify RN  Glucose, capillary     Status: Abnormal   Collection Time: 08/31/15  4:54 PM  Result Value Ref Range   Glucose-Capillary 218 (H) 65 - 99 mg/dL   Comment 1 Notify RN   Glucose, capillary     Status: Abnormal   Collection Time: 08/31/15  9:16 PM  Result Value Ref Range   Glucose-Capillary 148 (H) 65 - 99 mg/dL  Basic metabolic panel     Status: Abnormal    Collection Time: 09/01/15  2:44 AM  Result Value Ref Range   Sodium 137 135 - 145 mmol/L   Potassium 5.2 (H) 3.5 - 5.1 mmol/L   Chloride 109 101 - 111 mmol/L   CO2 17 (L) 22 - 32 mmol/L   Glucose, Bld 110 (H) 65 - 99 mg/dL   BUN 79 (H) 6 - 20 mg/dL   Creatinine, Ser 4.79 (H) 0.61 - 1.24 mg/dL   Calcium 8.4 (L) 8.9 - 10.3 mg/dL   GFR calc non Af Amer 11 (L) >60 mL/min   GFR calc Af Amer 12 (L) >60 mL/min    Comment: (NOTE) The eGFR has been calculated using the CKD EPI equation. This calculation has not been validated in all clinical situations. eGFR's persistently <60 mL/min signify possible Chronic Kidney Disease.    Anion gap 11 5 - 15  Protime-INR     Status: Abnormal   Collection Time: 09/01/15  2:44 AM  Result Value Ref Range   Prothrombin Time 26.8 (H) 11.6 - 15.2 seconds   INR 2.52 (H) 0.00 - 1.49  CBC     Status: Abnormal   Collection Time: 09/01/15  2:44 AM  Result Value Ref Range   WBC 5.2 4.0 - 10.5 K/uL   RBC 2.98 (L) 4.22 - 5.81 MIL/uL   Hemoglobin 9.0 (L) 13.0 - 17.0 g/dL   HCT 28.1 (L) 39.0 - 52.0 %   MCV 94.3 78.0 - 100.0 fL   MCH 30.2 26.0 - 34.0 pg   MCHC 32.0 30.0 - 36.0 g/dL   RDW 15.3 11.5 - 15.5 %   Platelets 112 (L) 150 - 400 K/uL    Comment: CONSISTENT WITH PREVIOUS RESULT  Glucose, capillary     Status: None   Collection Time: 09/01/15  6:17 AM  Result Value Ref Range   Glucose-Capillary 96 65 - 99 mg/dL   Comment 1 Notify RN    Comment 2 Document in Chart      Lipid Panel     Component Value Date/Time   CHOL 123 01/19/2015 0935   TRIG 80.0 01/19/2015 0935   HDL 47.40 01/19/2015 0935   CHOLHDL 3 01/19/2015 0935   VLDL 16.0 01/19/2015 0935   LDLCALC 60 01/19/2015 0935   LDLDIRECT 50.3 08/15/2010 0849     Lab Results  Component Value Date   HGBA1C 6.4* 07/20/2015   HGBA1C 7.1* 01/19/2015   HGBA1C 7.7* 09/18/2014     Lab Results  Component Value Date   LDLCALC 60 01/19/2015   CREATININE 4.79* 09/01/2015     HPI : 79 y.o.  male the past medical history that includes CAD, CHF, recent pneumonia,hypertension, CVA, diabetes, chronic kidney disease stage IV presents to the emergency department with a chief complaint of worsening shortness of breath and dyspnea with exertion. Initial evaluation in the emergency department concerning for acute on chronic combined heart failure in the setting of healthcare associated pneumonia. Patient reports gradual worsening of shortness of breath over the last week or so. He states he  has employment with his doctor tomorrow. He came to the emergency department today due to sudden worsening of dyspnea with exertion. "My breath just leaves me for bit". Associated symptoms include some abdominal tightness. Activity makes it worse inactivity makes it better. He denies chest pain palpitations headache dizziness syncope or near-syncope. He denies any fever chills cough nausea vomiting. He denies any dysuria hematuria frequency or urgency. He reports eating and drinking his normal amount with a steady weight. He reports he is on 80 mg of Lasix every day and has been compliant with this.  Workup in the emergency department includes a basic metabolic panel significant for a chloride of 114 CO2 of 18 serum glucose 129 creatinine 4.64 (which is his baseline). INR is 2.78,BNP 1483 initial troponin is negative urinalysis unremarkable. Chest x-ray right middle lobe dense consolidative opacity compatible with partial collapse versus consolidative pneumonia.Cardiomegaly with chronic vascular congestion recommend radiographic follow-up after medical therapy to document resolution.EKG sinus rhythm with 1st degree A-V block with Fusion complexes and Premature atrial complexes with Abberant conduction rightward axis nonspecific T wave abnormality prolonged QT abnormal ECG  In the emergency department he is afebrile hemodynamically stable and not hypoxic. He received 40 mg Lasix IV antibiotics.    HOSPITAL COURSE:     #1. hcap/right middle lobe pneumonia. Patient was admitted for healthcare associated pneumonia in the setting of mild acute on chronic combined heart failure per chest x-ray. Recent echo with an EF of 40%. Patient is afebrile no leukocytosis hemodynamically stable. Not hypoxic and improved after initial Lasix dose Required 2 L of oxygen, telemetry showed V. tach last night  urine for strep pneumo and Legionella was not collected Blood culture negative thus far Patient was continued on broad-spectrum antibiotics for 3 days, prior to being switched to Augmentin based on negative culture results Patient to continue with Augmentin 7 days  #2. Acute on chronic combined heart failure. Recent echo with an EF of 40%.home medications include Lasix 80 mg daily. Has been compliant with medications Initiated on Lasix 60 mg IV every 12 for more aggressive diuresis. Now switch to 60 mg by mouth PO every 12 hours -Daily weights, intake and output, still -Not on ACE inhibitor due to chronic kidney disease. Continue beta blocker   #3. HCAP . See #1  #4. Atrial fibrillation. Patient is on Coumadin. INR therapeutic -Coumadin per pharmacy Patient cannot discharge home until his INR is stable  #5. Diabetes, appears controlled.on oral agents at home -Hemoglobin A1c 6.4 on 07/20/15 Restart glipizide  #6. Hypertension.fair control in the emergency department. Will continue home meds     Discharge Exam:    Blood pressure 111/53, pulse 76, temperature 98.7 F (37.1 C), temperature source Oral, resp. rate 18, height $RemoveBe'5\' 9"'CYIFfSEpF$  (1.753 m), weight 84.596 kg (186 lb 8 oz), SpO2 99 %. General: No acute respiratory distress Lungs: Clear to auscultation bilaterally without wheezes or crackles Cardiovascular: Regular rate and rhythm without murmur gallop or rub normal S1 and S2 Abdomen: Nontender, nondistended, soft, bowel sounds positive, no rebound, no ascites, no appreciable mass Extremities: No significant  cyanosis, clubbing, or edema bilateral lower extremities       Signed: Vernadine Coombs 09/01/2015, 9:32 AM        Time spent >45 mins

## 2015-09-01 NOTE — Telephone Encounter (Signed)
Dr. Durene CalHunter said use 11:15 and 11:30 SDA on 09/07/15.

## 2015-09-01 NOTE — Progress Notes (Signed)
SATURATION QUALIFICATIONS: (This note is used to comply with regulatory documentation for home oxygen)  Patient Saturations on Room Air at Rest = 96%  Patient Saturations on Room Air while Ambulating = 93%  Patient Saturations  while Ambulating = 93%  Please briefly explain why patient needs home oxygen:pt does not need oxygen at this time.

## 2015-09-01 NOTE — Progress Notes (Signed)
Discharge instructions completed with patient. Medication regimen reviewed with patient. Rolling walker ready for patient to take home. Awaiting daughter/son to pick up.

## 2015-09-02 ENCOUNTER — Ambulatory Visit (INDEPENDENT_AMBULATORY_CARE_PROVIDER_SITE_OTHER): Payer: Medicare Other | Admitting: Pharmacist Clinician (PhC)/ Clinical Pharmacy Specialist

## 2015-09-02 ENCOUNTER — Telehealth: Payer: Self-pay | Admitting: *Deleted

## 2015-09-02 ENCOUNTER — Telehealth: Payer: Self-pay | Admitting: Family Medicine

## 2015-09-02 DIAGNOSIS — Z7901 Long term (current) use of anticoagulants: Secondary | ICD-10-CM

## 2015-09-02 DIAGNOSIS — I4891 Unspecified atrial fibrillation: Secondary | ICD-10-CM

## 2015-09-02 LAB — POCT INR: INR: 1.8

## 2015-09-02 NOTE — Telephone Encounter (Signed)
FYI-see below- 

## 2015-09-02 NOTE — Telephone Encounter (Signed)
Transition Care Management Follow-up Telephone Call  How have you been since you were released from the hospital? Tired, weak   Do you understand why you were in the hospital? yes   Do you understand the discharge instrcutions? yes  Items Reviewed:  Medications reviewed: yes - getting meds today  Allergies reviewed: yes  Dietary changes reviewed: yes  Referrals reviewed: yes   Functional Questionnaire:   Activities of Daily Living (ADLs):   He states they are independent in the following: needs help walking due to gout States they require assistance with the following: ambulation   Any transportation issues/concerns?: no   Any patient concerns? No.  Spoke with Ardine BjorkKeba   Confirmed importance and date/time of follow-up visits scheduled: yes   Confirmed with patient if condition begins to worsen call PCP or go to the ER.  Patient was given the Call-a-Nurse line [phone]: yes Information given by Tiffany from [facility]

## 2015-09-02 NOTE — Telephone Encounter (Signed)
Fleet ContrasRachel is taking care of it

## 2015-09-02 NOTE — Telephone Encounter (Signed)
Suspect he is going to need a visit. If he would like to trial 20mg  of prednisone for 5 days you can call that in. I though the was taking colchicine as needed not daily for gout

## 2015-09-02 NOTE — Telephone Encounter (Signed)
Called and spoke with tiffany and she states pt is still taking his gout medicine but she is not sure why they did not give him any in the hospital. She states he is taking it daily but it is not helping his gout. She states he fell this morning due to his gout being so bad trying to get to the bathroom.

## 2015-09-02 NOTE — Telephone Encounter (Signed)
Appears was given colchicine on the 3rd. Why is he not using that?

## 2015-09-02 NOTE — Telephone Encounter (Signed)
Called and let Tiffany know of Dr. Durene CalHunter recommendations and she will call while she is with pt tomorrow.

## 2015-09-02 NOTE — Telephone Encounter (Signed)
Shirlean Mylariffany Martin from [facility] is calling regarding this patient. Tiffany states that is a resumption of care and patient discharge yesterday from the hospital with DX Pneumonia. Patient is still weak and lethargy. Vital are good and patient has not taken any medications this morning 09/02/2015. His gout is flare and hospital did not give him any meds for it and patient fell this morning going to bathroom.

## 2015-09-03 ENCOUNTER — Emergency Department (HOSPITAL_COMMUNITY): Payer: Medicare Other

## 2015-09-03 ENCOUNTER — Inpatient Hospital Stay (HOSPITAL_COMMUNITY)
Admission: EM | Admit: 2015-09-03 | Discharge: 2015-09-13 | DRG: 981 | Disposition: A | Discharge status: Home / Self Care | Payer: Medicare Other | Attending: Family Medicine | Admitting: Family Medicine

## 2015-09-03 ENCOUNTER — Encounter (HOSPITAL_COMMUNITY): Payer: Self-pay | Admitting: Emergency Medicine

## 2015-09-03 DIAGNOSIS — R339 Retention of urine, unspecified: Secondary | ICD-10-CM | POA: Diagnosis present

## 2015-09-03 DIAGNOSIS — Z8673 Personal history of transient ischemic attack (TIA), and cerebral infarction without residual deficits: Secondary | ICD-10-CM

## 2015-09-03 DIAGNOSIS — E872 Acidosis: Secondary | ICD-10-CM | POA: Diagnosis present

## 2015-09-03 DIAGNOSIS — M109 Gout, unspecified: Secondary | ICD-10-CM

## 2015-09-03 DIAGNOSIS — R531 Weakness: Secondary | ICD-10-CM | POA: Diagnosis not present

## 2015-09-03 DIAGNOSIS — J189 Pneumonia, unspecified organism: Principal | ICD-10-CM | POA: Diagnosis present

## 2015-09-03 DIAGNOSIS — N184 Chronic kidney disease, stage 4 (severe): Secondary | ICD-10-CM

## 2015-09-03 DIAGNOSIS — E1129 Type 2 diabetes mellitus with other diabetic kidney complication: Secondary | ICD-10-CM | POA: Diagnosis present

## 2015-09-03 DIAGNOSIS — R5381 Other malaise: Secondary | ICD-10-CM

## 2015-09-03 DIAGNOSIS — I255 Ischemic cardiomyopathy: Secondary | ICD-10-CM | POA: Diagnosis present

## 2015-09-03 DIAGNOSIS — Y95 Nosocomial condition: Secondary | ICD-10-CM | POA: Diagnosis present

## 2015-09-03 DIAGNOSIS — E1121 Type 2 diabetes mellitus with diabetic nephropathy: Secondary | ICD-10-CM

## 2015-09-03 DIAGNOSIS — N179 Acute kidney failure, unspecified: Secondary | ICD-10-CM | POA: Diagnosis not present

## 2015-09-03 DIAGNOSIS — I5042 Chronic combined systolic (congestive) and diastolic (congestive) heart failure: Secondary | ICD-10-CM | POA: Diagnosis present

## 2015-09-03 DIAGNOSIS — E785 Hyperlipidemia, unspecified: Secondary | ICD-10-CM | POA: Diagnosis present

## 2015-09-03 DIAGNOSIS — G92 Toxic encephalopathy: Secondary | ICD-10-CM | POA: Diagnosis present

## 2015-09-03 DIAGNOSIS — Z8701 Personal history of pneumonia (recurrent): Secondary | ICD-10-CM

## 2015-09-03 DIAGNOSIS — E039 Hypothyroidism, unspecified: Secondary | ICD-10-CM | POA: Diagnosis present

## 2015-09-03 DIAGNOSIS — R400 Somnolence: Secondary | ICD-10-CM

## 2015-09-03 DIAGNOSIS — I4891 Unspecified atrial fibrillation: Secondary | ICD-10-CM | POA: Diagnosis present

## 2015-09-03 DIAGNOSIS — E1122 Type 2 diabetes mellitus with diabetic chronic kidney disease: Secondary | ICD-10-CM | POA: Diagnosis present

## 2015-09-03 DIAGNOSIS — Z992 Dependence on renal dialysis: Secondary | ICD-10-CM

## 2015-09-03 DIAGNOSIS — E669 Obesity, unspecified: Secondary | ICD-10-CM | POA: Diagnosis present

## 2015-09-03 DIAGNOSIS — Z955 Presence of coronary angioplasty implant and graft: Secondary | ICD-10-CM

## 2015-09-03 DIAGNOSIS — D696 Thrombocytopenia, unspecified: Secondary | ICD-10-CM | POA: Diagnosis not present

## 2015-09-03 DIAGNOSIS — M10369 Gout due to renal impairment, unspecified knee: Secondary | ICD-10-CM | POA: Diagnosis not present

## 2015-09-03 DIAGNOSIS — E038 Other specified hypothyroidism: Secondary | ICD-10-CM

## 2015-09-03 DIAGNOSIS — I132 Hypertensive heart and chronic kidney disease with heart failure and with stage 5 chronic kidney disease, or end stage renal disease: Secondary | ICD-10-CM | POA: Diagnosis present

## 2015-09-03 DIAGNOSIS — Z87891 Personal history of nicotine dependence: Secondary | ICD-10-CM

## 2015-09-03 DIAGNOSIS — D649 Anemia, unspecified: Secondary | ICD-10-CM | POA: Diagnosis present

## 2015-09-03 DIAGNOSIS — Z7901 Long term (current) use of anticoagulants: Secondary | ICD-10-CM

## 2015-09-03 DIAGNOSIS — I10 Essential (primary) hypertension: Secondary | ICD-10-CM | POA: Insufficient documentation

## 2015-09-03 DIAGNOSIS — N186 End stage renal disease: Secondary | ICD-10-CM

## 2015-09-03 DIAGNOSIS — I2510 Atherosclerotic heart disease of native coronary artery without angina pectoris: Secondary | ICD-10-CM | POA: Diagnosis present

## 2015-09-03 DIAGNOSIS — N189 Chronic kidney disease, unspecified: Secondary | ICD-10-CM

## 2015-09-03 LAB — CBC WITH DIFFERENTIAL/PLATELET
Basophils Absolute: 0 10*3/uL (ref 0.0–0.1)
Basophils Relative: 1 %
EOS ABS: 1 10*3/uL — AB (ref 0.0–0.7)
EOS PCT: 17 %
HCT: 28.7 % — ABNORMAL LOW (ref 39.0–52.0)
Hemoglobin: 9.2 g/dL — ABNORMAL LOW (ref 13.0–17.0)
LYMPHS ABS: 0.8 10*3/uL (ref 0.7–4.0)
Lymphocytes Relative: 13 %
MCH: 29.8 pg (ref 26.0–34.0)
MCHC: 32.1 g/dL (ref 30.0–36.0)
MCV: 92.9 fL (ref 78.0–100.0)
MONO ABS: 0.3 10*3/uL (ref 0.1–1.0)
Monocytes Relative: 5 %
Neutro Abs: 3.8 10*3/uL (ref 1.7–7.7)
Neutrophils Relative %: 65 %
PLATELETS: 118 10*3/uL — AB (ref 150–400)
RBC: 3.09 MIL/uL — AB (ref 4.22–5.81)
RDW: 15.1 % (ref 11.5–15.5)
WBC: 5.9 10*3/uL (ref 4.0–10.5)

## 2015-09-03 LAB — CULTURE, BLOOD (ROUTINE X 2)
Culture: NO GROWTH
Culture: NO GROWTH

## 2015-09-03 LAB — I-STAT CHEM 8, ED
BUN: 97 mg/dL — ABNORMAL HIGH (ref 6–20)
CALCIUM ION: 1.16 mmol/L (ref 1.13–1.30)
CHLORIDE: 108 mmol/L (ref 101–111)
Creatinine, Ser: 5.9 mg/dL — ABNORMAL HIGH (ref 0.61–1.24)
GLUCOSE: 86 mg/dL (ref 65–99)
HCT: 31 % — ABNORMAL LOW (ref 39.0–52.0)
HEMOGLOBIN: 10.5 g/dL — AB (ref 13.0–17.0)
Potassium: 4.8 mmol/L (ref 3.5–5.1)
SODIUM: 138 mmol/L (ref 135–145)
TCO2: 15 mmol/L (ref 0–100)

## 2015-09-03 LAB — I-STAT TROPONIN, ED: TROPONIN I, POC: 0.02 ng/mL (ref 0.00–0.08)

## 2015-09-03 LAB — TSH: TSH: 2.611 u[IU]/mL (ref 0.350–4.500)

## 2015-09-03 LAB — BRAIN NATRIURETIC PEPTIDE: B Natriuretic Peptide: 1191.8 pg/mL — ABNORMAL HIGH (ref 0.0–100.0)

## 2015-09-03 LAB — PROTIME-INR
INR: 2.14 — AB (ref 0.00–1.49)
Prothrombin Time: 23.7 seconds — ABNORMAL HIGH (ref 11.6–15.2)

## 2015-09-03 LAB — I-STAT CG4 LACTIC ACID, ED: Lactic Acid, Venous: 0.63 mmol/L (ref 0.5–2.0)

## 2015-09-03 MED ORDER — ACETAMINOPHEN 325 MG PO TABS
650.0000 mg | ORAL_TABLET | Freq: Four times a day (QID) | ORAL | Status: DC | PRN
  Administered 2015-09-06: 650 mg via ORAL
  Filled 2015-09-03: qty 2

## 2015-09-03 MED ORDER — SODIUM CHLORIDE 0.9 % IJ SOLN
3.0000 mL | Freq: Two times a day (BID) | INTRAMUSCULAR | Status: DC
  Administered 2015-09-03 – 2015-09-13 (×15): 3 mL via INTRAVENOUS

## 2015-09-03 MED ORDER — ISOSORBIDE MONONITRATE ER 60 MG PO TB24
60.0000 mg | ORAL_TABLET | Freq: Every day | ORAL | Status: DC
  Administered 2015-09-04 – 2015-09-06 (×3): 60 mg via ORAL
  Filled 2015-09-03 (×3): qty 1

## 2015-09-03 MED ORDER — ACETAMINOPHEN 650 MG RE SUPP: 650.0000 mg | Freq: Four times a day (QID) | RECTAL | Status: DC | PRN

## 2015-09-03 MED ORDER — AMOXICILLIN-POT CLAVULANATE 500-125 MG PO TABS
1.0000 | ORAL_TABLET | Freq: Two times a day (BID) | ORAL | Status: DC
  Filled 2015-09-03: qty 1

## 2015-09-03 MED ORDER — HYDRALAZINE HCL 50 MG PO TABS
50.0000 mg | ORAL_TABLET | Freq: Three times a day (TID) | ORAL | Status: DC
  Administered 2015-09-03 – 2015-09-05 (×6): 50 mg via ORAL
  Filled 2015-09-03 (×6): qty 1

## 2015-09-03 MED ORDER — HYDROCODONE-ACETAMINOPHEN 5-325 MG PO TABS: 1.0000 | ORAL_TABLET | ORAL | Status: DC | PRN

## 2015-09-03 MED ORDER — ONDANSETRON HCL 4 MG PO TABS: 4.0000 mg | ORAL_TABLET | Freq: Four times a day (QID) | ORAL | Status: DC | PRN

## 2015-09-03 MED ORDER — PREDNISONE 20 MG PO TABS
20.0000 mg | ORAL_TABLET | Freq: Every day | ORAL | Status: DC
  Administered 2015-09-03: 20 mg via ORAL
  Filled 2015-09-03: qty 1

## 2015-09-03 MED ORDER — ACETAMINOPHEN 500 MG PO TABS: 500.0000 mg | ORAL_TABLET | Freq: Two times a day (BID) | ORAL | Status: DC | PRN

## 2015-09-03 MED ORDER — CALCITRIOL 0.5 MCG PO CAPS
0.5000 ug | ORAL_CAPSULE | ORAL | Status: DC
  Administered 2015-09-05 – 2015-09-13 (×5): 0.5 ug via ORAL
  Filled 2015-09-03 (×7): qty 1

## 2015-09-03 MED ORDER — NITROGLYCERIN 0.4 MG SL SUBL: 0.4000 mg | SUBLINGUAL_TABLET | SUBLINGUAL | Status: DC | PRN

## 2015-09-03 MED ORDER — AMLODIPINE BESYLATE 10 MG PO TABS
10.0000 mg | ORAL_TABLET | Freq: Every day | ORAL | Status: DC
  Administered 2015-09-04 – 2015-09-05 (×2): 10 mg via ORAL
  Filled 2015-09-03 (×2): qty 1

## 2015-09-03 MED ORDER — LEVOTHYROXINE SODIUM 175 MCG PO TABS
175.0000 ug | ORAL_TABLET | Freq: Every day | ORAL | Status: DC
Start: 2015-09-04 — End: 2015-09-13
  Administered 2015-09-04 – 2015-09-13 (×10): 175 ug via ORAL
  Filled 2015-09-03 (×10): qty 1

## 2015-09-03 MED ORDER — ROSUVASTATIN CALCIUM 20 MG PO TABS
20.0000 mg | ORAL_TABLET | Freq: Every day | ORAL | Status: DC
  Administered 2015-09-04: 20 mg via ORAL
  Filled 2015-09-03: qty 1

## 2015-09-03 MED ORDER — HEPARIN SODIUM (PORCINE) 5000 UNIT/ML IJ SOLN
5000.0000 [IU] | Freq: Three times a day (TID) | INTRAMUSCULAR | Status: DC
  Administered 2015-09-03 – 2015-09-04 (×2): 5000 [IU] via SUBCUTANEOUS
  Filled 2015-09-03 (×2): qty 1

## 2015-09-03 MED ORDER — OLOPATADINE HCL 0.1 % OP SOLN
1.0000 [drp] | Freq: Every day | OPHTHALMIC | Status: DC
  Administered 2015-09-04 – 2015-09-13 (×10): 1 [drp] via OPHTHALMIC
  Filled 2015-09-03 (×2): qty 5

## 2015-09-03 MED ORDER — DOCUSATE SODIUM 100 MG PO CAPS: 100.0000 mg | ORAL_CAPSULE | Freq: Two times a day (BID) | ORAL | Status: DC

## 2015-09-03 MED ORDER — SODIUM CHLORIDE 0.9 % IV BOLUS (SEPSIS)
500.0000 mL | Freq: Once | INTRAVENOUS | Status: AC
Start: 2015-09-03 — End: 2015-09-03
  Administered 2015-09-03: 500 mL via INTRAVENOUS

## 2015-09-03 MED ORDER — AMOXICILLIN-POT CLAVULANATE 500-125 MG PO TABS
1.0000 | ORAL_TABLET | ORAL | Status: AC
  Administered 2015-09-04 – 2015-09-08 (×5): 500 mg via ORAL
  Filled 2015-09-03 (×6): qty 1

## 2015-09-03 MED ORDER — WARFARIN SODIUM 2.5 MG PO TABS
2.5000 mg | ORAL_TABLET | ORAL | Status: DC
  Filled 2015-09-03: qty 1

## 2015-09-03 MED ORDER — DOCUSATE SODIUM 100 MG PO CAPS
100.0000 mg | ORAL_CAPSULE | Freq: Two times a day (BID) | ORAL | Status: DC
  Administered 2015-09-03 – 2015-09-12 (×16): 100 mg via ORAL
  Filled 2015-09-03 (×19): qty 1

## 2015-09-03 MED ORDER — CALCITRIOL 0.25 MCG PO CAPS
0.2500 ug | ORAL_CAPSULE | ORAL | Status: DC
  Administered 2015-09-04 – 2015-09-10 (×4): 0.25 ug via ORAL
  Filled 2015-09-03 (×8): qty 1

## 2015-09-03 MED ORDER — HEPARIN SODIUM (PORCINE) 5000 UNIT/ML IJ SOLN: 5000.0000 [IU] | Freq: Three times a day (TID) | INTRAMUSCULAR | Status: DC

## 2015-09-03 MED ORDER — WARFARIN - PHARMACIST DOSING INPATIENT
Freq: Every day | Status: DC
  Administered 2015-09-05 – 2015-09-06 (×2)

## 2015-09-03 MED ORDER — CALCITRIOL 0.25 MCG PO CAPS: 0.2500 ug | ORAL_CAPSULE | Freq: Every day | ORAL | Status: DC

## 2015-09-03 MED ORDER — CARVEDILOL 12.5 MG PO TABS
12.5000 mg | ORAL_TABLET | Freq: Two times a day (BID) | ORAL | Status: DC
Start: 2015-09-03 — End: 2015-09-04
  Administered 2015-09-03: 12.5 mg via ORAL
  Filled 2015-09-03: qty 1

## 2015-09-03 MED ORDER — WARFARIN SODIUM 5 MG PO TABS: 5.0000 mg | ORAL_TABLET | ORAL | Status: DC

## 2015-09-03 MED ORDER — TAMSULOSIN HCL 0.4 MG PO CAPS
0.4000 mg | ORAL_CAPSULE | Freq: Every day | ORAL | Status: DC
  Administered 2015-09-04 – 2015-09-12 (×9): 0.4 mg via ORAL
  Filled 2015-09-03 (×10): qty 1

## 2015-09-03 MED ORDER — ONDANSETRON HCL 4 MG/2ML IJ SOLN
4.0000 mg | Freq: Four times a day (QID) | INTRAMUSCULAR | Status: DC | PRN
  Administered 2015-09-08: 4 mg via INTRAVENOUS

## 2015-09-03 MED ORDER — BISACODYL 10 MG RE SUPP: 10.0000 mg | Freq: Every day | RECTAL | Status: DC | PRN

## 2015-09-03 NOTE — H&P (Signed)
[facility] History and Physical  [name] [name] ZOX:[id number] DOB: 1936/08/02 DOA: 09/03/2015  Referring physician: Emergency Department PCP: [name], MD   CHIEF COMPLAINT:  weakness   HPI: [name] [name] is a 79 y.o. male with multiple medical problems not limited to atrial fibrillation on Coumadin, diabetes, hypertension ,coronary artery disease, ischemic cardiomyopathy and chronic kidney disease. Patient was discharged from the hospital 2 days ago after a three-day stay for HCAP and acute on chronic combined heart failure. Patient was discharged home on Augmentin. Home health nurse recommended patient return to emergency department today. Patient really has not been out of bed since hospital discharge. Family having problems eating rolling him over in bed secondary to weakness  patient denies chest pain, shortness of breath, cough, fever or chills. Patient states he has been eating and drinking. He reports good urine output, wife thinks urine output might be slightly less than normal.     Patient reports bilateral knee pain. He has a history of gout. On cholchine at home on as needed basis  ED COURSE:     Labs:   BUN 97, creatinine 5.9  Trop 0.02 L.acid 0.63 Normal WBC, hemoglobin 10.5     CXR:    Mild improvement in right middle lobe pneumonia     Review of Systems  Constitutional: Positive for malaise/fatigue.  HENT: Negative.   Eyes: Negative.   Respiratory: Negative.   Cardiovascular: Negative.   Gastrointestinal: Negative.   Genitourinary: Negative.   Musculoskeletal: Positive for joint pain.  Neurological: Positive for weakness.  Endo/Heme/Allergies: Negative.   Psychiatric/Behavioral: Negative.     Past Medical History  Diagnosis Date  . CAD (coronary artery disease)     a. s/p PCI to mid and mid-distal RCA, 40% sten L main, no sig dz LCx  b. myoview 11/24/2014 inferoapex ischemia, medical management due to high risk for contrast nephropathy  .  HTN (hypertension)   . Hyperlipidemia   . Cerebrovascular accident (HCC)   . CHF (congestive heart failure) (HCC)     EF 35% echo 2012 ; EF 44% myoview 2012; EF 76% by [facility] November 2014  . Diabetes mellitus   . 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 Unspecified hypothyroidism   . Gout   . Respiratory failure, acute (HCC) 07/21/2015   Past Surgical History  Procedure Laterality Date  . Thyroidectomy      SOCIAL HISTORY:  reports that he quit smoking about 61 years ago. His smoking use included Cigarettes. He smoked 0.50 packs per day. He does not have any smokeless tobacco history on file. He reports that he does not drink alcohol or use illicit drugs. Lives:  At home with wife    Assistive devices:   Uses a cane for ambulation.   Allergies  Allergen Reactions  . Tomato Other (See Comments)    Too much acid - cannot take due to kidney issues    Family History  Problem Relation Age of Onset  . Kidney failure Mother   . Diabetes Mother   . Cancer Father     lung  . Heart attack Brother     massive heart attack in 55s  . Hypertension Mother      Prior to Admission medications   Medication Sig Start Date End Date Taking? Authorizing Provider  acetaminophen (TYLENOL) 500 MG tablet Take 500 mg by mouth 2 (two) times daily as needed (pain).     Historical Provider, MD  amLODipine (NORVASC) 10 MG tablet Take 10 mg by mouth daily. 04/30/15   Historical Provider, MD  amoxicillin-clavulanate (AUGMENTIN) 500-125 MG tablet Take 1 tablet (500 mg total) by mouth every 12 (twelve) hours. 09/01/15   Richarda Overlie, MD  calcitRIOL (ROCALTROL) 0.25 MCG capsule Take 0.25-0.5 mcg by mouth daily. Take 1 tablet ( 0.69mcg) on odd days and  2 tablets (0.30mcg) on even days as directed    Historical Provider, MD  carvedilol (COREG) 12.5 MG tablet Take 1 tablet (12.5 mg total) by mouth 2 (two) times daily with a meal. 01/11/15   [name] Patty, MD  [facility] Liver Oil OIL Take 15 mLs by mouth  daily.    Historical Provider, MD  colchicine 0.6 MG tablet Take 0.5 tablets (0.3 mg total) by mouth daily. Patient taking differently: Take 0.3 mg by mouth daily as needed (gout).  08/19/15   [name], MD  furosemide (LASIX) 40 MG tablet Take 1.5 tablets (60 mg total) by mouth 2 (two) times daily. 09/01/15   Richarda Overlie, MD  glipiZIDE (GLUCOTROL) 5 MG tablet Take 2.5-5 mg by mouth 2 (two) times daily before a meal. Take 1/2 tablet (2.5 mg) daily before breakfast and 1 tablet (5 mg) before supper 05/01/15   Historical Provider, MD  hydrALAZINE (APRESOLINE) 50 MG tablet Take 1 tablet (50 mg total) by mouth 3 (three) times daily. 09/01/15   Richarda Overlie, MD  isosorbide mononitrate (IMDUR) 60 MG 24 hr tablet Take 1 tablet (60 mg total) by mouth daily. 02/15/15   [name], MD  levothyroxine (SYNTHROID, LEVOTHROID) 175 MCG tablet Take 1 tablet (175 mcg total) by mouth daily. 08/13/15   [name], MD  Magnesium 250 MG TABS Take 250 mg by mouth daily.    Historical Provider, MD  nitroGLYCERIN (NITROSTAT) 0.4 MG SL tablet Place 1 tablet (0.4 mg total) under the tongue every 5 (five) minutes as needed for chest pain. 11/25/14 04/25/17  Azalee Course, PA  olopatadine (PATANOL) 0.1 % ophthalmic solution Place 1 drop into both eyes 2 (two) times daily. Patient taking differently: Place 1 drop into both eyes daily.  04/20/15   [name], MD  rosuvastatin (CRESTOR) 20 MG tablet Take 1 tablet (20 mg total) by mouth daily. 03/12/15   [name], MD  tamsulosin (FLOMAX) 0.4 MG CAPS capsule Take 1 capsule (0.4 mg total) by mouth daily. 09/01/15   Richarda Overlie, MD  warfarin (COUMADIN) 5 MG tablet Take 1 tablet (5 mg total) by mouth daily. Patient taking differently: Take 2.5-5 mg by mouth daily. Take 1 tablet (5 mg) by mouth Sunday, Tuesday, Thursday, take 1/2 tablet (2.5 mg) on Monday, Wednesday, Friday and Saturday 07/23/15   Calvert Cantor, MD   PHYSICAL EXAM: Filed Vitals:   09/03/15 1347    BP: 125/59  Pulse: 72  Temp: 98.7 F (37.1 C)  TempSrc: Oral  Resp: 25  SpO2: 95%    Wt Readings from Last 3 Encounters:  09/01/15 84.596 kg (186 lb 8 oz)  08/13/15 84.823 kg (187 lb)  07/30/15 80.74 kg (178 lb)    General:  Pleasant well developed blalck male. Appears calm and comfortable Eyes: PER, normal lids, irises & conjunctiva ENT: grossly normal hearing, lips. Tongue slightly dry Neck: no LAD, no masses Cardiovascular: RRR, no murmurs, !+ BLE edema  Respiratory: Respirations even and unlabored. Normal respiratory effort. Lungs CTA bilaterally, no wheezes / rales .   Abdomen: soft, mildy distended with tympany, non-tender, active  bowel sounds. No obvious masses.  Skin: no rash seen on limited exam Musculoskeletal: grossly normal tone BUE/BLE Psychiatric: grossly normal mood and affect, speech fluent and appropriate Neurologic: grossly non-focal.         LABS ON ADMISSION:    Basic Metabolic Panel:  Recent Labs Lab 08/29/15 1130 08/30/15 0438 08/31/15 0350 09/01/15 0244 09/03/15 1406  NA 140 142 138 137 138  K 4.4 4.8 5.0 5.2* 4.8  CL 114* 114* 111 109 108  CO2 18* 17* 18* 17*  --   GLUCOSE 129* 86 103* 110* 86  BUN 79* 77* 75* 79* 97*  CREATININE 4.64* 4.55* 4.71* 4.79* 5.90*  CALCIUM 8.9 9.1 8.5* 8.4*  --    Liver Function Tests:  Recent Labs Lab 08/31/15 0350  AST 20  ALT 17  ALKPHOS 42  BILITOT 0.5  PROT 5.5*  ALBUMIN 2.8*    CBC:  Recent Labs Lab 08/29/15 1130 08/31/15 0350 09/01/15 0244 09/03/15 1356 09/03/15 1406  WBC 2.9* 4.3 5.2 5.9  --   NEUTROABS  --   --   --  3.8  --   HGB 9.5* 9.0* 9.0* 9.2* 10.5*  HCT 29.9* 28.9* 28.1* 28.7* 31.0*  MCV 96.1 95.1 94.3 92.9  --   PLT 123* 117* 112* PENDING  --     BNP (last 3 results)  Recent Labs  11/23/14 1532 12/10/14 0530 08/29/15 1235  BNP 592.3* 326.5* 1483.1*    CBG:  Recent Labs Lab 08/31/15 1120 08/31/15 1654 08/31/15 2116 09/01/15 0617 09/01/15 1123  GLUCAP  126* 218* 148* 96 146*    CREATININE: 5.9 mg/dL ABNORMAL (56/21/30 8657) Estimated creatinine clearance - 10.5 mL/min  Radiological Exams on Admission: Dg Chest 2 View  09/03/2015  CLINICAL DATA:  Pt was recently hospitalized for PNA, was released Wed. and has since felt "tired/fatiqued", stated no chest complaints EXAM: CHEST  2 VIEW COMPARISON:  08/29/2015 FINDINGS: Mild decrease in the airspace opacity noted in the right middle lobe on the prior study, with no new areas of lung consolidation. Lungs are mildly hyperexpanded. No convincing pulmonary edema. No pleural effusion or pneumothorax. Heart is mildly enlarged. No mediastinal or hilar masses or convincing adenopathy. Bony thorax is demineralized but intact. IMPRESSION: Mild improvement in the right middle lobe pneumonia since the prior study. No other change. No new abnormality. Electronically Signed   By: Amie Portland M.D.   On: 09/03/2015 14:18     ASSESSMENT / PLAN    Acute on chronic kidney failure.  known to Dr. Briant Cedar. Patient states he will not go on dialysis . Baseline creatinine 4.7, currently 5.9. Recently hospitalized and given IV Lasix. Home Lasix increased to discharge from 80 mg daily to 120 mg daily. -Hold Lasix for now - appears slightly dry. Will give 500cc NS bolus.  -Repeat the mat in the a.m.  Weakness -PT evaluation   HCAP (healthcare-associated pneumonia.  on Augmentin at home. Chest x-ray shows improvement in pneumonia. -Continue Augmentin   Diabetes, type II.  -Hold home oral diabetic agents -CBGs and SSI  Atrial fibrillation, rate controlled. INR yesterday 1.8 --Continue on Coumadin per pharmacy  Bilateral knee pain, history of gout. Hurts to stand. Patient was not getting gout medication during recent hospitalization. -Hold home cholchine given renal failure -trial of prednisone  daily with CBGs checks  Hypothyroidism.  -Continue Synthroid -check TSH      Hypertension, stable.    -Continue home antihypertensives    CONSULTANTS:  Code Status: Do not intubate DVT Prophylaxis: Heparin Family Communication:    Disposition Plan: Discharge to home in 24-48 hours   Time spent: 60 minutes Willette ClusterPaula Jesseka Drinkard  NP [facility] Pager [phone]

## 2015-09-03 NOTE — Progress Notes (Signed)
ANTICOAGULATION CONSULT NOTE - Initial Consult  Pharmacy Consult for warfarin Indication: atrial fibrillation  Allergies  Allergen Reactions  . Tomato Other (See Comments)    Too much acid - cannot take due to kidney issues    Vital Signs: Temp: 98.7 F (37.1 C) (11/18 1347) Temp Source: Oral (11/18 1347) BP: 125/59 mmHg (11/18 1347) Pulse Rate: 72 (11/18 1347)  Labs:  Recent Labs  09/01/15 0244 09/02/15 09/03/15 1356 09/03/15 1406  HGB 9.0*  --  9.2* 10.5*  HCT 28.1*  --  28.7* 31.0*  PLT 112*  --  118*  --   LABPROT 26.8*  --   --   --   INR 2.52* 1.8  --   --   CREATININE 4.79*  --   --  5.90*    Estimated Creatinine Clearance: 10.5 mL/min (by C-G formula based on Cr of 5.9).   Medical History: Past Medical History  Diagnosis Date  . CAD (coronary artery disease)     a. s/p PCI to mid and mid-distal RCA, 40% sten L main, no sig dz LCx  b. myoview 11/24/2014 inferoapex ischemia, medical management due to high risk for contrast nephropathy  . HTN (hypertension)   . Hyperlipidemia   . Cerebrovascular accident (HCC)   . CHF (congestive heart failure) (HCC)     EF 35% echo 2012 ; EF 44% myoview 2012; [facility]  . Diabetes mellitus   . 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 Unspecified hypothyroidism   . Gout   . Respiratory failure, acute (HCC) 07/21/2015    Assessment: 79 yo m presenting to the ED on 11/18 with weakness.  Patient is on warfarin at home for afib.  PTA dose is 5 mg on Sun, Tues, Thurs and 2.5 mg on MWF and Saturday. Hgb 9.2, plts low at 118. INR is 2.14 today and per patient, he has already had today's dose.   Goal of Therapy:  INR 2-3 Monitor platelets by anticoagulation protocol: Yes   Plan:  Continue home warfarin dose of 5 mg Sun, Tues, Thurs, and 2.5 mg on MWF and Saturday starting tomorrow Daily INR Monitor for s/sx of bleeding  Khylen Riolo L. Roseanne RenoStewart, PharmD Clinical Pharmacy Resident Pager:  [phone] 09/03/2015 4:11 PM

## 2015-09-03 NOTE — ED Notes (Signed)
Pt diagnosed on Saturday with pneumonia; since being home has become increasingly weak; bilateral leg pain, difficulty performing ADL's at home, reports being in bed since being home. Has [facility] nurse at home on Thursdays, per EMS. A/O x4. VSS.

## 2015-09-03 NOTE — Telephone Encounter (Signed)
Pt scheduled tcm on 09/07/15

## 2015-09-03 NOTE — ED Provider Notes (Signed)
CSN: [id number]     Arrival date & time 09/03/15  1335 History   First MD Initiated Contact with Patient 09/03/15 1351     Chief Complaint  Patient presents with  . Weakness     (Consider location/radiation/quality/duration/timing/severity/associated sxs/prior Treatment) HPI   79 year old male with past medical history including CHF, CAD, recent pneumonia, hypertension, diabetes, chronic kidney disease stage IV, CVA recently discharged from the hospital 2 days ago after he was diagnosed with having hospital-acquired pneumonia. Patient returned today for evaluation of generalized weakness. Patient was treated with broad-spectrum antibiotic while in hospital and was transitioned over to Augmentin once culture shows negative result. Since being home for the past 2 days his wife reported increasing weakness with bilateral leg pain, having trouble performing his daily activities of living and has been mostly bedbound. His home health nurse visited him today and felt that he needs to return to the hospital because he cannot perform any ADL. Patient state he has history of gout and complaining of bilateral knee pain. He denies increasing shortness of breath, fever, or worsening cough. He has been able to eat and drink but not much. Aside from knees pain he denies having any other complaint.   Past Medical History  Diagnosis Date  . CAD (coronary artery disease)     a. s/p PCI to mid and mid-distal RCA, 40% sten L main, no sig dz LCx  b. myoview 11/24/2014 inferoapex ischemia, medical management due to high risk for contrast nephropathy  . HTN (hypertension)   . Hyperlipidemia   . Cerebrovascular accident (HCC)   . CHF (congestive heart failure) (HCC)     EF 35% echo 2012 ; EF 44% myoview 2012; EF 76% by [facility] November 2014  . Diabetes mellitus   . 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 Unspecified hypothyroidism   . Gout   . Respiratory failure, acute (HCC) 07/21/2015   Past Surgical  History  Procedure Laterality Date  . Thyroidectomy     Family History  Problem Relation Age of Onset  . Kidney failure Mother   . Diabetes Mother   . Cancer Father     lung  . Heart attack Brother     massive heart attack in 84s  . Hypertension Mother    Social History  Substance Use Topics  . Smoking status: Former Smoker -- 0.50 packs/day    Types: Cigarettes    Quit date: 10/16/1953  . Smokeless tobacco: None     Comment: quit in the 70's  . Alcohol Use: No    Review of Systems  All other systems reviewed and are negative.     Allergies  Tomato  Home Medications   Prior to Admission medications   Medication Sig Start Date End Date Taking? Authorizing Provider  acetaminophen (TYLENOL) 500 MG tablet Take 500 mg by mouth 2 (two) times daily as needed (pain).     Historical Provider, MD  amLODipine (NORVASC) 10 MG tablet Take 10 mg by mouth daily. 04/30/15   Historical Provider, MD  amoxicillin-clavulanate (AUGMENTIN) 500-125 MG tablet Take 1 tablet (500 mg total) by mouth every 12 (twelve) hours. 09/01/15   Richarda Overlie, MD  calcitRIOL (ROCALTROL) 0.25 MCG capsule Take 0.25-0.5 mcg by mouth daily. Take 1 tablet ( 0.102mcg) on odd days and  2 tablets (0.63mcg) on even days as directed    Historical Provider, MD  carvedilol (COREG) 12.5 MG tablet Take 1 tablet (12.5 mg total) by  mouth 2 (two) times daily with a meal. 01/11/15   [name] Patty, MD  [facility] Liver Oil OIL Take 15 mLs by mouth daily.    Historical Provider, MD  colchicine 0.6 MG tablet Take 0.5 tablets (0.3 mg total) by mouth daily. Patient taking differently: Take 0.3 mg by mouth daily as needed (gout).  08/19/15   [name], MD  furosemide (LASIX) 40 MG tablet Take 1.5 tablets (60 mg total) by mouth 2 (two) times daily. 09/01/15   Richarda Overlie, MD  glipiZIDE (GLUCOTROL) 5 MG tablet Take 2.5-5 mg by mouth 2 (two) times daily before a meal. Take 1/2 tablet (2.5 mg) daily before breakfast and 1 tablet (5 mg)  before supper 05/01/15   Historical Provider, MD  hydrALAZINE (APRESOLINE) 50 MG tablet Take 1 tablet (50 mg total) by mouth 3 (three) times daily. 09/01/15   Richarda Overlie, MD  isosorbide mononitrate (IMDUR) 60 MG 24 hr tablet Take 1 tablet (60 mg total) by mouth daily. 02/15/15   [name], MD  levothyroxine (SYNTHROID, LEVOTHROID) 175 MCG tablet Take 1 tablet (175 mcg total) by mouth daily. 08/13/15   [name], MD  Magnesium 250 MG TABS Take 250 mg by mouth daily.    Historical Provider, MD  nitroGLYCERIN (NITROSTAT) 0.4 MG SL tablet Place 1 tablet (0.4 mg total) under the tongue every 5 (five) minutes as needed for chest pain. 11/25/14 04/25/17  Azalee Course, PA  olopatadine (PATANOL) 0.1 % ophthalmic solution Place 1 drop into both eyes 2 (two) times daily. Patient taking differently: Place 1 drop into both eyes daily.  04/20/15   [name], MD  rosuvastatin (CRESTOR) 20 MG tablet Take 1 tablet (20 mg total) by mouth daily. 03/12/15   [name], MD  tamsulosin (FLOMAX) 0.4 MG CAPS capsule Take 1 capsule (0.4 mg total) by mouth daily. 09/01/15   Richarda Overlie, MD  warfarin (COUMADIN) 5 MG tablet Take 1 tablet (5 mg total) by mouth daily. Patient taking differently: Take 2.5-5 mg by mouth daily. Take 1 tablet (5 mg) by mouth Sunday, Tuesday, Thursday, take 1/2 tablet (2.5 mg) on Monday, Wednesday, Friday and Saturday 07/23/15   Calvert Cantor, MD   BP 125/59 mmHg  Pulse 72  Temp(Src) 98.7 F (37.1 C) (Oral)  Resp 25  SpO2 95% Physical Exam  Constitutional: He is oriented to person, place, and time. He appears well-developed and well-nourished. No distress.  African-American male, sleepy but easily arousable.  HENT:  Head: Atraumatic.  Mouth is dry.  Eyes: Conjunctivae are normal.  Neck: Neck supple. JVD present.  No nuchal rigidity  Cardiovascular: Normal rate and regular rhythm.   Pulmonary/Chest: Effort normal and breath sounds normal.  Decreased breath sounds without  any obvious rales rhonchi or wheezes.  Abdominal: Soft. There is no tenderness.  Musculoskeletal: He exhibits tenderness (tenderness to bilateral knees on palpation, increase with knee flexion extension.). He exhibits no edema.  Equal strength to bilateral upper and lower extremities with poor effort  Neurological: He is alert and oriented to person, place, and time.  Skin: No rash noted.  Psychiatric: He has a normal mood and affect.  Nursing note and vitals reviewed.   ED Course  Procedures (including critical care time)  Patient here with generalized weakness after discharge from hospital from a CHF exacerbation along with healthcare associated pneumonia. Decreased ADL due to bilateral knee pain secondary from gout along with generalized weakness. Symptoms likely due to deconditioning. He has  history of stage IV kidney disease, BUN 97, creatinine 5.9 which is higher than previous value. His chest x-ray shows mild improvement of the right middle lobe pneumonia since prior study without any other changes. He is currently taking Augmentin. No leukocytosis. Evidence of anemia with hemoglobin 9.2, which is at his baseline.  CAre discussed with Dr. Criss AlvineGoldston.  Plan to consult hospitalist for further management.    2:45 PM Appreciate consultation from [facility], Gunnar Fusiaula who will see pt in the ER and will determine disposition.    3:08 PM Per wife, our hospitalist team has seen and evaluated pt in the ER and will admit for further care.   Labs Review Labs Reviewed  CBC WITH DIFFERENTIAL/PLATELET - Abnormal; Notable for the following:    RBC 3.09 (*)    Hemoglobin 9.2 (*)    HCT 28.7 (*)    Platelets 118 (*)    Eosinophils Absolute 1.0 (*)    All other components within normal limits  I-STAT CHEM 8, ED - Abnormal; Notable for the following:    BUN 97 (*)    Creatinine, Ser 5.90 (*)    Hemoglobin 10.5 (*)    HCT 31.0 (*)    All other components within normal limits  PROTIME-INR   BRAIN NATRIURETIC PEPTIDE  I-STAT TROPOININ, ED  I-STAT CG4 LACTIC ACID, ED    Imaging Review Dg Chest 2 View  09/03/2015  CLINICAL DATA:  Pt was recently hospitalized for PNA, was released Wed. and has since felt "tired/fatiqued", stated no chest complaints EXAM: CHEST  2 VIEW COMPARISON:  08/29/2015 FINDINGS: Mild decrease in the airspace opacity noted in the right middle lobe on the prior study, with no new areas of lung consolidation. Lungs are mildly hyperexpanded. No convincing pulmonary edema. No pleural effusion or pneumothorax. Heart is mildly enlarged. No mediastinal or hilar masses or convincing adenopathy. Bony thorax is demineralized but intact. IMPRESSION: Mild improvement in the right middle lobe pneumonia since the prior study. No other change. No new abnormality. Electronically Signed   By: Amie Portlandavid  Ormond M.D.   On: 09/03/2015 14:18   I have personally reviewed and evaluated these images and lab results as part of my medical decision-making.   EKG Interpretation None      MDM   Final diagnoses:  Physical deconditioning  Gout of knee due to renal impairment, unspecified chronicity, unspecified laterality    BP 125/59 mmHg  Pulse 72  Temp(Src) 98.7 F (37.1 C) (Oral)  Resp 25  SpO2 95%     Fayrene HelperBowie Dnya Hickle, PA-C 09/03/15 1509  Fayrene HelperBowie Jeriel Vivanco, PA-C 09/03/15 1535  Pricilla LovelessScott Goldston, MD 09/08/15 1718

## 2015-09-04 ENCOUNTER — Observation Stay (HOSPITAL_COMMUNITY): Payer: Medicare Other

## 2015-09-04 DIAGNOSIS — R531 Weakness: Secondary | ICD-10-CM | POA: Diagnosis present

## 2015-09-04 DIAGNOSIS — E1122 Type 2 diabetes mellitus with diabetic chronic kidney disease: Secondary | ICD-10-CM | POA: Diagnosis present

## 2015-09-04 DIAGNOSIS — N179 Acute kidney failure, unspecified: Secondary | ICD-10-CM | POA: Diagnosis present

## 2015-09-04 DIAGNOSIS — Y95 Nosocomial condition: Secondary | ICD-10-CM | POA: Diagnosis present

## 2015-09-04 DIAGNOSIS — J189 Pneumonia, unspecified organism: Secondary | ICD-10-CM | POA: Diagnosis present

## 2015-09-04 DIAGNOSIS — M109 Gout, unspecified: Secondary | ICD-10-CM | POA: Diagnosis present

## 2015-09-04 DIAGNOSIS — I4891 Unspecified atrial fibrillation: Secondary | ICD-10-CM | POA: Diagnosis present

## 2015-09-04 DIAGNOSIS — Z87891 Personal history of nicotine dependence: Secondary | ICD-10-CM | POA: Diagnosis not present

## 2015-09-04 DIAGNOSIS — E669 Obesity, unspecified: Secondary | ICD-10-CM | POA: Diagnosis present

## 2015-09-04 DIAGNOSIS — I132 Hypertensive heart and chronic kidney disease with heart failure and with stage 5 chronic kidney disease, or end stage renal disease: Secondary | ICD-10-CM | POA: Diagnosis present

## 2015-09-04 DIAGNOSIS — R339 Retention of urine, unspecified: Secondary | ICD-10-CM | POA: Diagnosis present

## 2015-09-04 DIAGNOSIS — E872 Acidosis: Secondary | ICD-10-CM | POA: Diagnosis present

## 2015-09-04 DIAGNOSIS — N184 Chronic kidney disease, stage 4 (severe): Secondary | ICD-10-CM | POA: Diagnosis not present

## 2015-09-04 DIAGNOSIS — I2510 Atherosclerotic heart disease of native coronary artery without angina pectoris: Secondary | ICD-10-CM | POA: Diagnosis present

## 2015-09-04 DIAGNOSIS — N186 End stage renal disease: Secondary | ICD-10-CM | POA: Diagnosis present

## 2015-09-04 DIAGNOSIS — E039 Hypothyroidism, unspecified: Secondary | ICD-10-CM | POA: Diagnosis present

## 2015-09-04 DIAGNOSIS — M10369 Gout due to renal impairment, unspecified knee: Secondary | ICD-10-CM | POA: Diagnosis not present

## 2015-09-04 DIAGNOSIS — I10 Essential (primary) hypertension: Secondary | ICD-10-CM | POA: Diagnosis not present

## 2015-09-04 DIAGNOSIS — Z7901 Long term (current) use of anticoagulants: Secondary | ICD-10-CM | POA: Diagnosis not present

## 2015-09-04 DIAGNOSIS — D649 Anemia, unspecified: Secondary | ICD-10-CM | POA: Diagnosis present

## 2015-09-04 DIAGNOSIS — D696 Thrombocytopenia, unspecified: Secondary | ICD-10-CM | POA: Diagnosis not present

## 2015-09-04 DIAGNOSIS — I5042 Chronic combined systolic (congestive) and diastolic (congestive) heart failure: Secondary | ICD-10-CM | POA: Diagnosis present

## 2015-09-04 DIAGNOSIS — E785 Hyperlipidemia, unspecified: Secondary | ICD-10-CM | POA: Diagnosis present

## 2015-09-04 DIAGNOSIS — N189 Chronic kidney disease, unspecified: Secondary | ICD-10-CM | POA: Diagnosis not present

## 2015-09-04 DIAGNOSIS — Z955 Presence of coronary angioplasty implant and graft: Secondary | ICD-10-CM | POA: Diagnosis not present

## 2015-09-04 DIAGNOSIS — Z8673 Personal history of transient ischemic attack (TIA), and cerebral infarction without residual deficits: Secondary | ICD-10-CM | POA: Diagnosis not present

## 2015-09-04 DIAGNOSIS — I255 Ischemic cardiomyopathy: Secondary | ICD-10-CM | POA: Diagnosis present

## 2015-09-04 DIAGNOSIS — Z8701 Personal history of pneumonia (recurrent): Secondary | ICD-10-CM | POA: Diagnosis not present

## 2015-09-04 DIAGNOSIS — G92 Toxic encephalopathy: Secondary | ICD-10-CM | POA: Diagnosis present

## 2015-09-04 LAB — BASIC METABOLIC PANEL WITH GFR
Anion gap: 13 (ref 5–15)
BUN: 107 mg/dL — ABNORMAL HIGH (ref 6–20)
CO2: 16 mmol/L — ABNORMAL LOW (ref 22–32)
Calcium: 8.6 mg/dL — ABNORMAL LOW (ref 8.9–10.3)
Chloride: 108 mmol/L (ref 101–111)
Creatinine, Ser: 6.22 mg/dL — ABNORMAL HIGH (ref 0.61–1.24)
GFR calc Af Amer: 9 mL/min — ABNORMAL LOW (ref >60)
GFR calc non Af Amer: 8 mL/min — ABNORMAL LOW (ref >60)
Glucose, Bld: 182 mg/dL — ABNORMAL HIGH (ref 65–99)
Potassium: 5.2 mmol/L — ABNORMAL HIGH (ref 3.5–5.1)
Sodium: 137 mmol/L (ref 135–145)

## 2015-09-04 LAB — HEPATIC FUNCTION PANEL
ALK PHOS: 57 U/L (ref 38–126)
ALT: 34 U/L (ref 17–63)
AST: 57 U/L — ABNORMAL HIGH (ref 15–41)
Albumin: 2.5 g/dL — ABNORMAL LOW (ref 3.5–5.0)
Bilirubin, Direct: 0.2 mg/dL (ref 0.1–0.5)
Indirect Bilirubin: 0.2 mg/dL — ABNORMAL LOW (ref 0.3–0.9)
TOTAL PROTEIN: 5.6 g/dL — AB (ref 6.5–8.1)
Total Bilirubin: 0.4 mg/dL (ref 0.3–1.2)

## 2015-09-04 LAB — BLOOD GAS, ARTERIAL
ACID-BASE DEFICIT: 10 mmol/L — AB (ref 0.0–2.0)
Bicarbonate: 14.4 mEq/L — ABNORMAL LOW (ref 20.0–24.0)
DRAWN BY: 105521
FIO2: 0.21
O2 SAT: 93.6 %
PCO2 ART: 26.1 mmHg — AB (ref 35.0–45.0)
PO2 ART: 75.9 mmHg — AB (ref 80.0–100.0)
Patient temperature: 98.6
TCO2: 15.2 mmol/L (ref 0–100)
pH, Arterial: 7.36 (ref 7.350–7.450)

## 2015-09-04 LAB — CBC
HCT: 26.8 % — ABNORMAL LOW (ref 39.0–52.0)
Hemoglobin: 8.6 g/dL — ABNORMAL LOW (ref 13.0–17.0)
MCH: 30 pg (ref 26.0–34.0)
MCHC: 32.1 g/dL (ref 30.0–36.0)
MCV: 93.4 fL (ref 78.0–100.0)
Platelets: 121 K/uL — ABNORMAL LOW (ref 150–400)
RBC: 2.87 MIL/uL — ABNORMAL LOW (ref 4.22–5.81)
RDW: 15 % (ref 11.5–15.5)
WBC: 4 K/uL (ref 4.0–10.5)

## 2015-09-04 LAB — GLUCOSE, CAPILLARY
GLUCOSE-CAPILLARY: 224 mg/dL — AB (ref 65–99)
GLUCOSE-CAPILLARY: 260 mg/dL — AB (ref 65–99)
Glucose-Capillary: 158 mg/dL — ABNORMAL HIGH (ref 65–99)
Glucose-Capillary: 276 mg/dL — ABNORMAL HIGH (ref 65–99)

## 2015-09-04 LAB — URINALYSIS, ROUTINE W REFLEX MICROSCOPIC
Bilirubin Urine: NEGATIVE
Glucose, UA: NEGATIVE mg/dL
Hgb urine dipstick: NEGATIVE
KETONES UR: 15 mg/dL — AB
LEUKOCYTES UA: NEGATIVE
NITRITE: NEGATIVE
PH: 5 (ref 5.0–8.0)
Protein, ur: 100 mg/dL — AB
Specific Gravity, Urine: 1.019 (ref 1.005–1.030)

## 2015-09-04 LAB — URINE MICROSCOPIC-ADD ON
Bacteria, UA: NONE SEEN
RBC / HPF: NONE SEEN RBC/hpf (ref 0–5)
WBC, UA: NONE SEEN WBC/hpf (ref 0–5)

## 2015-09-04 LAB — TROPONIN I: Troponin I: <0.03 ng/mL (ref <0.031)

## 2015-09-04 LAB — PROTIME-INR
INR: 2.53 — ABNORMAL HIGH (ref 0.00–1.49)
Prothrombin Time: 26.9 s — ABNORMAL HIGH (ref 11.6–15.2)

## 2015-09-04 LAB — URIC ACID: Uric Acid, Serum: 11.6 mg/dL — ABNORMAL HIGH (ref 4.4–7.6)

## 2015-09-04 MED ORDER — NALOXONE HCL 0.4 MG/ML IJ SOLN
0.4000 mg | INTRAMUSCULAR | Status: DC | PRN
Start: 2015-09-04 — End: 2015-09-05
  Filled 2015-09-04: qty 1

## 2015-09-04 MED ORDER — CARVEDILOL 12.5 MG PO TABS
12.5000 mg | ORAL_TABLET | Freq: Two times a day (BID) | ORAL | Status: DC
  Administered 2015-09-04 – 2015-09-05 (×4): 12.5 mg via ORAL
  Filled 2015-09-04 (×5): qty 1

## 2015-09-04 MED ORDER — SODIUM CHLORIDE 0.9 % IV SOLN
INTRAVENOUS | Status: DC
  Administered 2015-09-04 – 2015-09-05 (×2): via INTRAVENOUS

## 2015-09-04 MED ORDER — ROSUVASTATIN CALCIUM 10 MG PO TABS
10.0000 mg | ORAL_TABLET | Freq: Every day | ORAL | Status: DC
  Administered 2015-09-05 – 2015-09-09 (×5): 10 mg via ORAL
  Filled 2015-09-04 (×5): qty 1

## 2015-09-04 MED ORDER — INSULIN ASPART 100 UNIT/ML ~~LOC~~ SOLN
0.0000 [IU] | Freq: Every day | SUBCUTANEOUS | Status: DC
  Administered 2015-09-04: 3 [IU] via SUBCUTANEOUS
  Administered 2015-09-05 – 2015-09-06 (×2): 2 [IU] via SUBCUTANEOUS
  Administered 2015-09-07: 3 [IU] via SUBCUTANEOUS
  Administered 2015-09-08: 4 [IU] via SUBCUTANEOUS
  Administered 2015-09-09: 3 [IU] via SUBCUTANEOUS
  Administered 2015-09-10 – 2015-09-11 (×2): 4 [IU] via SUBCUTANEOUS
  Administered 2015-09-12: 5 [IU] via SUBCUTANEOUS

## 2015-09-04 MED ORDER — INSULIN ASPART 100 UNIT/ML ~~LOC~~ SOLN
0.0000 [IU] | Freq: Three times a day (TID) | SUBCUTANEOUS | Status: DC
  Administered 2015-09-04: 3 [IU] via SUBCUTANEOUS
  Administered 2015-09-04: 5 [IU] via SUBCUTANEOUS
  Administered 2015-09-05 (×2): 2 [IU] via SUBCUTANEOUS
  Administered 2015-09-05: 3 [IU] via SUBCUTANEOUS
  Administered 2015-09-06: 1 [IU] via SUBCUTANEOUS
  Administered 2015-09-06 – 2015-09-07 (×3): 2 [IU] via SUBCUTANEOUS
  Administered 2015-09-07: 3 [IU] via SUBCUTANEOUS
  Administered 2015-09-08: 2 [IU] via SUBCUTANEOUS
  Administered 2015-09-08 (×2): 3 [IU] via SUBCUTANEOUS
  Administered 2015-09-09: 2 [IU] via SUBCUTANEOUS
  Administered 2015-09-09: 7 [IU] via SUBCUTANEOUS
  Administered 2015-09-09 – 2015-09-11 (×4): 3 [IU] via SUBCUTANEOUS
  Administered 2015-09-11: 2 [IU] via SUBCUTANEOUS
  Administered 2015-09-11: 5 [IU] via SUBCUTANEOUS
  Administered 2015-09-12: 3 [IU] via SUBCUTANEOUS
  Administered 2015-09-12: 5 [IU] via SUBCUTANEOUS
  Administered 2015-09-12: 3 [IU] via SUBCUTANEOUS
  Administered 2015-09-13: 2 [IU] via SUBCUTANEOUS

## 2015-09-04 MED ORDER — NALOXONE HCL 0.4 MG/ML IJ SOLN: 0.4000 mg | INTRAMUSCULAR | Status: DC | PRN

## 2015-09-04 MED ORDER — PREDNISONE 20 MG PO TABS
20.0000 mg | ORAL_TABLET | Freq: Every day | ORAL | Status: DC
  Administered 2015-09-04 – 2015-09-12 (×9): 20 mg via ORAL
  Filled 2015-09-04 (×9): qty 1

## 2015-09-04 NOTE — Progress Notes (Signed)
PT Cancellation Note  Patient Details Name: [name] [name] MRN: [id number] DOB: 07/21/1936   Cancelled Treatment:    Reason Eval/Treat Not Completed: Medical issues which prohibited therapy;Other (comment) (Nursing asked PT to hold visit, pt unresponsive).   Try again Monday.     Ivar DrapeStout, Adilyn Humes E 09/04/2015, 11:13 AM   Samul Dadauth Ellora Varnum, PT MS Acute Rehab Dept. Number: [facility] [phone] and [facility] [phone]

## 2015-09-04 NOTE — Progress Notes (Signed)
ANTICOAGULATION CONSULT NOTE Pharmacy Consult for warfarin Indication: atrial fibrillation  Allergies  Allergen Reactions  . Tomato Other (See Comments)    Too much acid - cannot take due to kidney issues   Labs:  Recent Labs  09/02/15  09/03/15 1356 09/03/15 1406 09/03/15 1533 09/04/15 0345  HGB  --   < > 9.2* 10.5*  --  8.6*  HCT  --   --  28.7* 31.0*  --  26.8*  PLT  --   --  118*  --   --  121*  LABPROT  --   --   --   --  23.7* 26.9*  INR 1.8  --   --   --  2.14* 2.53*  CREATININE  --   --   --  5.90*  --  6.22*  < > = values in this interval not displayed.  Estimated Creatinine Clearance: 10.8 mL/min (by C-G formula based on Cr of 6.22).   Assessment: 79 yo m presenting to the ED on 11/18 with weakness.  Patient is on warfarin at home for afib.  PTA dose is 5 mg on Sun, Tues, Thurs and 2.5 mg on MWF and Saturday.   INR therapeutic on home dose  Reduced Crestor for worsening renal failure to 10 mg po daily   Goal of Therapy:  INR 2-3 Monitor platelets by anticoagulation protocol: Yes   Plan:  Continue home warfarin dose of 5 mg Sun, Tues, Thurs, and 2.5 mg on MWF and Saturday starting tomorrow Daily INR Monitor for s/sx of bleeding  Thank you Okey RegalLisa Brucha Ahlquist, PharmD [phone]  09/04/2015 11:41 AM

## 2015-09-04 NOTE — Progress Notes (Signed)
Pt laying in bed student nurse and instructor unable to awake patient after multiple attempts. This nurse called the room to assisted with patient. Attempted to awaken the patient, pt very lethargic. MD made aware orders received. Bladder scan completed 350 ml Foley ordered. As well as blood gases. Attempted to place foley pt became wide awake A&Ox4. Family at the bedside discussed concerns and questions answered.

## 2015-09-04 NOTE — Progress Notes (Addendum)
[facility] PROGRESS NOTE  [name] [name] ZOX:[id number] DOB: 01-24-1936 DOA: 09/03/2015 PCP: [name], MD  Length of stay:    Assessment/Plan: Active Problems:   Hypothyroidism   Type II diabetes mellitus with renal manifestations (HCC)   GOUT   Chronic renal disease, stage IV (HCC)   Atrial fibrillation (HCC)   HCAP (healthcare-associated pneumonia)   Hypertension   Acute on chronic kidney failure (HCC)   Weakness   Acute encephalopathy -Suspect that the patient was taking his Vicodin for his gout in the setting of his worsening renal function, which may have contributing to his somnolence Obtain ABG, Narcan, CT head if mental status does not improve with Narcan 2 doses  We'll also repeat a portable chest x-ray   Acute on chronic kidney failure. known to Dr. Briant Cedar. Patient states he will not go on dialysis . Baseline creatinine 4.7, currently 6.22. Recently hospitalized and given IV Lasix. Home Lasix increased at discharge from 80 mg daily to 60 mg twice a day in the setting of CHF exacerbation during his last hospitalization, as chest x-ray during that admission showed   vascular congestion Holding Lasix for now - appears slightly dry. Will give 500cc NS bolus.  Continue IV hydration and see the kidney function improves Found to have urinary retention, Will  Place  Foley catheter, strict I's and O's  Weakness -PT evaluation  HCAP (healthcare-associated pneumonia. on Augmentin at home. Chest x-ray shows improvement in pneumonia., Patient unable to take Augmentin therefore will switch patient to cefepime 3 more days then discontinue, no fever therefore will not repeat blood cultures  Diabetes, type II.  -Hold home oral diabetic agents, Accu-Chek stable -CBGs and SSI  Atrial fibrillation, rate controlled. INR therapeutic --Continue on Coumadin per pharmacy  Bilateral knee pain, history of gout. Hurts to stand. Patient was not getting gout  medication during recent hospitalization. Check uric acid -Hold   cholchine given renal failure, started on prednisone Continue prednisone 20 mg a day   Hypothyroidism.  -Continue Synthroid -check TSH    Hypertension, stable.  -Continue home antihypertensives   DVT prophylaxsis Coumadin  Code Status:      Code Status Orders        Start     Ordered   09/03/15 1520  Full code   Continuous     09/03/15 1523    Advance Directive Documentation        Most Recent Value   Type of Advance Directive  Living will, Healthcare Power of Attorney   Pre-existing out of facility DNR order (yellow form or pink MOST form)     "MOST" Form in Place?       Family Communication: family updated about patient's clinical progress Disposition Plan:  As above    Brief narrative: 79 y.o. male with a Past Medical History of atrial fibrillation on Coumadin, diabetes, hypertension, coronary artery disease, chronic kidney disease stage IV-V, was recently hospitalized and discharged 2 days ago for healthcare associated pneumonia, improved at that time and discharged on Augmentin. Since he was home for 2 days, he has been extremely deconditioned and was unable to get out of his bed. Family's reports that he has been more lethargic and sleepy throughout the day. Patient is alert and oriented 4, however is apparent that he can easily drift back to sleep during this interview. He denies any chest pain, denies any fever or chills, he denies any abdominal pain, nausea, vomiting or diarrhea. He  reports that his urine output is unchanged, however the family states that he has been peeing less. He reports bilateral knee pain, ankle pain and toe pain consistent with his prior history of gout. It appears that [name] [name] has been having quite a decline in the last 1-2 months, his kidney functions gotten progressively worse. He is followed with nephrology and patient has expressed in the past his wishes not to  undergo dialysis if needed. Family is in the room and confirms that this is truly his wishes. He is having progressive renal failure, his BUN is close to 100 and creatinine is close to 6. He appears clinically dry, will provide small 500 mL bolus and hold Lasix for tonight. Repeat BMP in the morning. We'll ask PT to eval the patient. For his recent healthcare associated pneumonia, we'll continue antibiotics. It is not unreasonable to consider palliative care consult soon rather than later given his progressive renal failure and his wishes of not to undergo hemodialysis.  Consultants:  None  Procedures:  None  Antibiotics: Anti-infectives    Start     Dose/Rate Route Frequency Ordered Stop   09/04/15 1000  amoxicillin-clavulanate (AUGMENTIN) 500-125 MG per tablet 500 mg     1 tablet Oral Every 24 hours 09/03/15 1653 09/09/15 0959   09/03/15 2200  amoxicillin-clavulanate (AUGMENTIN) 500-125 MG per tablet 500 mg  Status:  Discontinued     1 tablet Oral Every 12 hours 09/03/15 1639 09/03/15 1653         HPI/Subjective: Very sleepy, per RN the patient had breakfast and then slept again  Objective: Filed Vitals:   09/03/15 2059 09/04/15 0234 09/04/15 0535 09/04/15 0810  BP: 116/74 114/80 114/62 122/65  Pulse: 68 65 66 70  Temp: 98.3 F (36.8 C) 98 F (36.7 C) 98.1 F (36.7 C) 98.4 F (36.9 C)  TempSrc: Oral Oral Oral Oral  Resp: 20 18 20 20   Height:      Weight:   85.99 kg (189 lb 9.2 oz)   SpO2: 98% 98% 93% 99%    Intake/Output Summary (Last 24 hours) at 09/04/15 1102 Last data filed at 09/04/15 0600  Gross per 24 hour  Intake    460 ml  Output      0 ml  Net    460 ml    Exam:  General: Patient very sleepy and somnolent but easily arousable Lungs: Clear to auscultation bilaterally without wheezes or crackles Cardiovascular: Regular rate and rhythm without murmur gallop or rub normal S1 and S2 Abdomen: Nontender, nondistended, soft, bowel sounds positive, no  rebound, no ascites, no appreciable mass Extremities: No significant cyanosis, clubbing, or edema bilateral lower extremities     Data Review   Micro Results Recent Results (from the past 240 hour(s))  Urine culture     Status: None   Collection Time: 08/29/15  1:53 PM  Result Value Ref Range Status   Specimen Description URINE, CLEAN CATCH  Final   Special Requests NONE  Final   Culture MULTIPLE SPECIES PRESENT, SUGGEST RECOLLECTION  Final   Report Status 08/31/2015 FINAL  Final  Culture, blood (routine x 2) Call MD if unable to obtain prior to antibiotics being given     Status: None   Collection Time: 08/29/15  7:00 PM  Result Value Ref Range Status   Specimen Description BLOOD LEFT ANTECUBITAL  Final   Special Requests BOTTLES DRAWN AEROBIC AND ANAEROBIC 10CC  Final   Culture NO GROWTH 5 DAYS  Final  Report Status 09/03/2015 FINAL  Final  Culture, blood (routine x 2) Call MD if unable to obtain prior to antibiotics being given     Status: None   Collection Time: 08/29/15  7:05 PM  Result Value Ref Range Status   Specimen Description BLOOD LEFT ARM  Final   Special Requests BOTTLES DRAWN AEROBIC AND ANAEROBIC 10CC  Final   Culture NO GROWTH 5 DAYS  Final   Report Status 09/03/2015 FINAL  Final  MRSA PCR Screening     Status: None   Collection Time: 09/01/15  9:06 AM  Result Value Ref Range Status   MRSA by PCR NEGATIVE NEGATIVE Final    Comment:        The GeneXpert MRSA Assay (FDA approved for NASAL specimens only), is one component of a comprehensive MRSA colonization surveillance program. It is not intended to diagnose MRSA infection nor to guide or monitor treatment for MRSA infections.     Radiology Reports Dg Chest 2 View  09/03/2015  CLINICAL DATA:  Pt was recently hospitalized for PNA, was released Wed. and has since felt "tired/fatiqued", stated no chest complaints EXAM: CHEST  2 VIEW COMPARISON:  08/29/2015 FINDINGS: Mild decrease in the airspace  opacity noted in the right middle lobe on the prior study, with no new areas of lung consolidation. Lungs are mildly hyperexpanded. No convincing pulmonary edema. No pleural effusion or pneumothorax. Heart is mildly enlarged. No mediastinal or hilar masses or convincing adenopathy. Bony thorax is demineralized but intact. IMPRESSION: Mild improvement in the right middle lobe pneumonia since the prior study. No other change. No new abnormality. Electronically Signed   By: Amie Portland M.D.   On: 09/03/2015 14:18   Dg Chest 2 View  08/29/2015  CLINICAL DATA:  Acute shortness of breath, history CHF EXAM: CHEST  2 VIEW COMPARISON:  07/22/2015 FINDINGS: Dense consolidative opacity in the right middle lobe obscures the right cardiac border compatible with pneumonia or partial collapse. Heart is enlarged. Chronic vascular congestion noted without definite CHF. No effusion or pneumothorax. Trachea midline. Atherosclerosis noted of the aorta. IMPRESSION: Right middle lobe dense consolidative opacity compatible with partial collapse versus consolidative pneumonia. Cardiomegaly with chronic vascular congestion Recommend radiographic follow-up after medical therapy to document resolution. Electronically Signed   By: Judie Petit.  Shick M.D.   On: 08/29/2015 12:08     CBC  Recent Labs Lab 08/29/15 1130 08/31/15 0350 09/01/15 0244 09/03/15 1356 09/03/15 1406 09/04/15 0345  WBC 2.9* 4.3 5.2 5.9  --  4.0  HGB 9.5* 9.0* 9.0* 9.2* 10.5* 8.6*  HCT 29.9* 28.9* 28.1* 28.7* 31.0* 26.8*  PLT 123* 117* 112* 118*  --  121*  MCV 96.1 95.1 94.3 92.9  --  93.4  MCH 30.5 29.6 30.2 29.8  --  30.0  MCHC 31.8 31.1 32.0 32.1  --  32.1  RDW 15.8* 15.5 15.3 15.1  --  15.0  LYMPHSABS  --   --   --  0.8  --   --   MONOABS  --   --   --  0.3  --   --   EOSABS  --   --   --  1.0*  --   --   BASOSABS  --   --   --  0.0  --   --     Chemistries   Recent Labs Lab 08/29/15 1130 08/30/15 0438 08/31/15 0350 09/01/15 0244  09/03/15 1406 09/04/15 0345  NA 140 142 138 137 138 137  K 4.4 4.8 5.0 5.2* 4.8 5.2*  CL 114* 114* 111 109 108 108  CO2 18* 17* 18* 17*  --  16*  GLUCOSE 129* 86 103* 110* 86 182*  BUN 79* 77* 75* 79* 97* 107*  CREATININE 4.64* 4.55* 4.71* 4.79* 5.90* 6.22*  CALCIUM 8.9 9.1 8.5* 8.4*  --  8.6*  AST  --   --  20  --   --   --   ALT  --   --  17  --   --   --   ALKPHOS  --   --  42  --   --   --   BILITOT  --   --  0.5  --   --   --    ------------------------------------------------------------------------------------------------------------------ estimated creatinine clearance is 10.8 mL/min (by C-G formula based on Cr of 6.22). ------------------------------------------------------------------------------------------------------------------ No results for input(s): HGBA1C in the last 72 hours. ------------------------------------------------------------------------------------------------------------------ No results for input(s): CHOL, HDL, LDLCALC, TRIG, CHOLHDL, LDLDIRECT in the last 72 hours. ------------------------------------------------------------------------------------------------------------------  Recent Labs  09/03/15 1604  TSH 2.611   ------------------------------------------------------------------------------------------------------------------ No results for input(s): VITAMINB12, FOLATE, FERRITIN, TIBC, IRON, RETICCTPCT in the last 72 hours.  Coagulation profile  Recent Labs Lab 08/31/15 0350 09/01/15 0244 09/02/15 09/03/15 1533 09/04/15 0345  INR 3.81* 2.52* 1.8 2.14* 2.53*    No results for input(s): DDIMER in the last 72 hours.  Cardiac Enzymes No results for input(s): CKMB, TROPONINI, MYOGLOBIN in the last 168 hours.  Invalid input(s): CK ------------------------------------------------------------------------------------------------------------------ Invalid input(s): POCBNP   CBG:  Recent Labs Lab 08/31/15 1654 08/31/15 2116  09/01/15 0617 09/01/15 1123 09/04/15 0743  GLUCAP 218* 148* 96 146* 158*       Studies: Dg Chest 2 View  09/03/2015  CLINICAL DATA:  Pt was recently hospitalized for PNA, was released Wed. and has since felt "tired/fatiqued", stated no chest complaints EXAM: CHEST  2 VIEW COMPARISON:  08/29/2015 FINDINGS: Mild decrease in the airspace opacity noted in the right middle lobe on the prior study, with no new areas of lung consolidation. Lungs are mildly hyperexpanded. No convincing pulmonary edema. No pleural effusion or pneumothorax. Heart is mildly enlarged. No mediastinal or hilar masses or convincing adenopathy. Bony thorax is demineralized but intact. IMPRESSION: Mild improvement in the right middle lobe pneumonia since the prior study. No other change. No new abnormality. Electronically Signed   By: Amie Portland M.D.   On: 09/03/2015 14:18      Lab Results  Component Value Date   HGBA1C 6.4* 07/20/2015   HGBA1C 7.1* 01/19/2015   HGBA1C 7.7* 09/18/2014   Lab Results  Component Value Date   LDLCALC 60 01/19/2015   CREATININE 6.22* 09/04/2015       Scheduled Meds: . amLODipine  10 mg Oral Daily  . amoxicillin-clavulanate  1 tablet Oral Q24H  . calcitRIOL  0.25 mcg Oral Q48H   And  . [START ON 09/05/2015] calcitRIOL  0.5 mcg Oral Q48H  . carvedilol  12.5 mg Oral BID WC  . docusate sodium  100 mg Oral BID  . heparin  5,000 Units Subcutaneous 3 times per day  . hydrALAZINE  50 mg Oral TID  . insulin aspart  0-5 Units Subcutaneous QHS  . insulin aspart  0-9 Units Subcutaneous TID WC  . isosorbide mononitrate  60 mg Oral Daily  . levothyroxine  175 mcg Oral Daily  . olopatadine  1 drop Both Eyes Daily  . predniSONE  20 mg Oral Q breakfast  .  rosuvastatin  20 mg Oral Daily  . sodium chloride  3 mL Intravenous Q12H  . tamsulosin  0.4 mg Oral Daily  . warfarin  2.5 mg Oral Once per day on Mon Wed Fri Sat  . [START ON 09/05/2015] warfarin  5 mg Oral Once per day on Sun Tue  Thu  . Warfarin - Pharmacist Dosing Inpatient   Does not apply q1800   Continuous Infusions: . sodium chloride 75 mL/hr at 09/04/15 1610    Active Problems:   Hypothyroidism   Type II diabetes mellitus with renal manifestations (HCC)   GOUT   Chronic renal disease, stage IV (HCC)   Atrial fibrillation (HCC)   HCAP (healthcare-associated pneumonia)   Hypertension   Acute on chronic kidney failure (HCC)   Weakness    Time spent: 45 minutes   [facility]  [facility] Pager [phone]. If 7PM-7AM, please contact night-coverage at [email or web address], password [facility] 09/04/2015, 11:02 AM

## 2015-09-04 NOTE — Progress Notes (Signed)
 on bladder scan

## 2015-09-05 DIAGNOSIS — I10 Essential (primary) hypertension: Secondary | ICD-10-CM

## 2015-09-05 LAB — GLUCOSE, CAPILLARY
GLUCOSE-CAPILLARY: 168 mg/dL — AB (ref 65–99)
Glucose-Capillary: 166 mg/dL — ABNORMAL HIGH (ref 65–99)
Glucose-Capillary: 212 mg/dL — ABNORMAL HIGH (ref 65–99)
Glucose-Capillary: 244 mg/dL — ABNORMAL HIGH (ref 65–99)

## 2015-09-05 LAB — PROTIME-INR
INR: 2.8 — ABNORMAL HIGH (ref 0.00–1.49)
Prothrombin Time: 29.1 seconds — ABNORMAL HIGH (ref 11.6–15.2)

## 2015-09-05 LAB — COMPREHENSIVE METABOLIC PANEL
ALK PHOS: 57 U/L (ref 38–126)
ALT: 34 U/L (ref 17–63)
AST: 55 U/L — AB (ref 15–41)
Albumin: 2.4 g/dL — ABNORMAL LOW (ref 3.5–5.0)
Anion gap: 13 (ref 5–15)
BUN: 119 mg/dL — AB (ref 6–20)
CALCIUM: 8.5 mg/dL — AB (ref 8.9–10.3)
CHLORIDE: 108 mmol/L (ref 101–111)
CO2: 16 mmol/L — AB (ref 22–32)
CREATININE: 6.6 mg/dL — AB (ref 0.61–1.24)
GFR calc Af Amer: 8 mL/min — ABNORMAL LOW (ref >60)
GFR calc non Af Amer: 7 mL/min — ABNORMAL LOW (ref >60)
Glucose, Bld: 202 mg/dL — ABNORMAL HIGH (ref 65–99)
Potassium: 5.2 mmol/L — ABNORMAL HIGH (ref 3.5–5.1)
SODIUM: 137 mmol/L (ref 135–145)
Total Bilirubin: 0.6 mg/dL (ref 0.3–1.2)
Total Protein: 6.1 g/dL — ABNORMAL LOW (ref 6.5–8.1)

## 2015-09-05 LAB — CBC
HCT: 26.3 % — ABNORMAL LOW (ref 39.0–52.0)
HEMOGLOBIN: 8.5 g/dL — AB (ref 13.0–17.0)
MCH: 30 pg (ref 26.0–34.0)
MCHC: 32.3 g/dL (ref 30.0–36.0)
MCV: 92.9 fL (ref 78.0–100.0)
PLATELETS: 123 10*3/uL — AB (ref 150–400)
RBC: 2.83 MIL/uL — AB (ref 4.22–5.81)
RDW: 14.9 % (ref 11.5–15.5)
WBC: 4.6 10*3/uL (ref 4.0–10.5)

## 2015-09-05 MED ORDER — WARFARIN SODIUM 2.5 MG PO TABS
2.5000 mg | ORAL_TABLET | Freq: Once | ORAL | Status: AC
  Administered 2015-09-05: 2.5 mg via ORAL
  Filled 2015-09-05: qty 1

## 2015-09-05 MED ORDER — FUROSEMIDE 10 MG/ML IJ SOLN: 120.0000 mg | Freq: Four times a day (QID) | INTRAVENOUS | Status: DC

## 2015-09-05 MED ORDER — TRAMADOL HCL 50 MG PO TABS
25.0000 mg | ORAL_TABLET | Freq: Four times a day (QID) | ORAL | Status: DC | PRN
  Administered 2015-09-08: 25 mg via ORAL

## 2015-09-05 MED ORDER — FUROSEMIDE 10 MG/ML IJ SOLN
160.0000 mg | Freq: Four times a day (QID) | INTRAVENOUS | Status: DC
  Administered 2015-09-05 – 2015-09-06 (×2): 160 mg via INTRAVENOUS
  Filled 2015-09-05 (×6): qty 16

## 2015-09-05 NOTE — Progress Notes (Signed)
[facility] Progress Note    Patient: [name] ConradRoy L [name]     NWG:[id number]  DOB: 09/09/1936     DOA: 09/03/2015 Date of Service: the patient was seen and examined on 09/05/2015  Subjective: Patient is a mild pain in his knee. Does not have any abdominal pain or chest pain. Complains of shortness of breath. No fever no chills.  Nutrition: Able to tolerate oral diet. Activity: Walking around in the room. Physical therapy did not evaluate the patient yesterday Last BM: 09/05/2015  Assessment and Plan: 1. Acute on chronic kidney failure [facility](HCC) Patient presents with complaints of worsening renal function with confusion. Renal function continues to further worsen[facility] Discontinue IV fluids.  2. Metabolic acidosis  Increased shortness of breath. The patient appears to be tachypneic on my examination. Most like is secondary to metabolic acidosis from worsening renal function causing him to hyperventilate to compensate. We'll discuss with renal and start the patient on bicarbonate tablets.  3. gout. Continue with prednisone. Start on low-dose tramadol.  4. acute encephalopathy. An episode of unresponsiveness. Patient initially presented with confusion and acute encephalopathy which was thought to be secondary to narcotics. Initially the patient was stable but later on the patient had another episode of confusion and unresponsiveness earlier in the morning on 09/04/2015. Currently the patient is hemodynamically and neurologically stable. We'll continue close monitoring on telemetry. Check CT of the head to rule out any acute pathology.  5  Hypothyroidism TSH within normal limit. Continue Synthroid.  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  7  Essential hypertension Discontinue amlodipine in the setting of pedal edema as well as normal blood pressure.  8   Atrial fibrillation (HCC) Currently rate controlled. INR therapeutic. Pharmacy consulted for warfarin dose.  9  HCAP (healthcare-associated pneumonia) Patient had pneumonia last admission which is currently improving. Continuing Augmentin to complete the course.  DVT Prophylaxis: on therapeutic anticoagulation. Nutrition: Renal diet   Advance goals of care discussion: Full code  Brief Summary of Hospitalization:  HPI: From the H&P "[name] ConradRoy L Charlesworth is a 79 y.o. male with multiple medical problems not limited to atrial fibrillation on Coumadin, diabetes, hypertension ,coronary artery disease, ischemic cardiomyopathy and chronic kidney disease. Patient was discharged from the hospital 2 days ago after a three-day stay for HCAP and acute on chronic combined heart failure. Patient was discharged home on Augmentin. Home health nurse recommended patient return to emergency department today. Patient really has not been out of bed since hospital discharge. Family having problems eating rolling him over in bed secondary to weakness patient denies chest pain, shortness of breath, cough, fever or chills. Patient states he has been eating and drinking. He reports good urine output, wife thinks urine output might be slightly less than normal. " Daily update: Admitted on 09/03/2015 with confusion most at this secondary to medication. Improved but had one episode of unresponsiveness on 09/04/2015. 09/05/2015 nephrology consulted amlodipine discontinued  Consultants: Nephrology  Procedures: No  Antibiotics: Augmentin 09/01/2015  start date  Disposition:  Expected discharge date: 09/07/2015  Barriers to safe discharge: Renal function   Intake/Output Summary (Last 24 hours) at 09/05/15 1219 Last data filed at 09/05/15 1204  Gross per 24 hour  Intake 3096.25 ml  Output    500 ml  Net 2596.25 ml   Filed Weights   09/03/15 1630 09/04/15 0535 09/05/15 0427  Weight: 86.14 kg (189  lb 14.5 oz) 85.99 kg  (189 lb 9.2 oz) 85.2 kg (187 lb 13.3 oz)    Objective: Physical Exam: Filed Vitals:   09/04/15 1337 09/04/15 1959 09/04/15 2341 09/05/15 0427  BP: 105/54 110/62 110/60 108/66  Pulse: 69 65 67 62  Temp: 97.4 F (36.3 C) 98.1 F (36.7 C)  98 F (36.7 C)  TempSrc: Oral Oral  Oral  Resp: Height:      Weight:    85.2 kg (187 lb 13.3 oz)  SpO2: 98% 98% 96% 98%     General: Appear in moderate distress, no Rash; Oral Mucosa moist. Cardiovascular: S1 and S2 Present, no Murmur, difficult to assess  JVD Respiratory: Bilateral Air entry present and Clear to Auscultation, no Crackles, no wheezes Abdomen: Bowel Sound present, Soft and no tenderness Extremities: Bilateral  Pedal edema, no calf tenderness Neurology: Grossly no focal neuro deficit.  Data Reviewed: CBC:  Recent Labs Lab 08/31/15 0350 09/01/15 0244 09/03/15 1356 09/03/15 1406 09/04/15 0345 09/05/15 0156  WBC 4.3 5.2 5.9  --  4.0 4.6  NEUTROABS  --   --  3.8  --   --   --   HGB 9.0* 9.0* 9.2* 10.5* 8.6* 8.5*  HCT 28.9* 28.1* 28.7* 31.0* 26.8* 26.3*  MCV 95.1 94.3 92.9  --  93.4 92.9  PLT 117* 112* 118*  --  121* 123*   Basic Metabolic Panel:  Recent Labs Lab 08/30/15 0438 08/31/15 0350 09/01/15 0244 09/03/15 1406 09/04/15 0345 09/05/15 0156  NA 142 138 137 138 137 137  K 4.8 5.0 5.2* 4.8 5.2* 5.2*  CL 114* 111 109 108 108 108  CO2 17* 18* 17*  --  16* 16*  GLUCOSE 86 103* 110* 86 182* 202*  BUN 77* 75* 79* 97* 107* 119*  CREATININE 4.55* 4.71* 4.79* 5.90* 6.22* 6.60*  CALCIUM 9.1 8.5* 8.4*  --  8.6* 8.5*   Liver Function Tests:  Recent Labs Lab 08/31/15 0350 09/04/15 0345 09/05/15 0156  AST 20 57* 55*  ALT 17 34 34  ALKPHOS 42 57 57  BILITOT 0.5 0.4 0.6  PROT 5.5* 5.6* 6.1*  ALBUMIN 2.8* 2.5* 2.4*   No results for input(s): LIPASE, AMYLASE in the last 168 hours. No results for input(s): AMMONIA in the last 168 hours.  Cardiac Enzymes:  Recent Labs Lab 09/04/15 1250    TROPONINI <0.03   BNP (last 3 results)  Recent Labs  12/10/14 0530 08/29/15 1235 09/03/15 1356  BNP 326.5* 1483.1* 1191.8*    ProBNP (last 3 results) No results for input(s): PROBNP in the last 8760 hours.   CBG:  Recent Labs Lab 09/04/15 1109 09/04/15 1627 09/04/15 2131 09/05/15 0543 09/05/15 1136  GLUCAP 224* 276* 260* 166* 168*    Recent Results (from the past 240 hour(s))  Urine culture     Status: None   Collection Time: 08/29/15  1:53 PM  Result Value Ref Range Status   Specimen Description URINE, CLEAN CATCH  Final   Special Requests NONE  Final   Culture MULTIPLE SPECIES PRESENT, SUGGEST RECOLLECTION  Final   Report Status 08/31/2015 FINAL  Final  Culture, blood (routine x 2) Call MD if unable to obtain prior to antibiotics being given     Status: None   Collection Time: 08/29/15  7:00 PM  Result Value Ref Range Status   Specimen Description BLOOD LEFT ANTECUBITAL  Final   Special Requests BOTTLES DRAWN AEROBIC AND ANAEROBIC 10CC  Final  Culture NO GROWTH 5 DAYS  Final   Report Status 09/03/2015 FINAL  Final  Culture, blood (routine x 2) Call MD if unable to obtain prior to antibiotics being given     Status: None   Collection Time: 08/29/15  7:05 PM  Result Value Ref Range Status   Specimen Description BLOOD LEFT ARM  Final   Special Requests BOTTLES DRAWN AEROBIC AND ANAEROBIC 10CC  Final   Culture NO GROWTH 5 DAYS  Final   Report Status 09/03/2015 FINAL  Final  MRSA PCR Screening     Status: None   Collection Time: 09/01/15  9:06 AM  Result Value Ref Range Status   MRSA by PCR NEGATIVE NEGATIVE Final    Comment:        The GeneXpert MRSA Assay (FDA approved for NASAL specimens only), is one component of a comprehensive MRSA colonization surveillance program. It is not intended to diagnose MRSA infection nor to guide or monitor treatment for MRSA infections.      Studies: Dg Chest Port 1 View  09/04/2015  CLINICAL DATA:  Patient with  weakness.  On Coumadin. EXAM: PORTABLE CHEST 1 VIEW COMPARISON:  Chest radiograph 09/03/2015 FINDINGS: Monitoring leads overlie the patient. Stable marked cardiomegaly. No significant interval change bilateral mid and lower lung heterogeneous opacities. No large pleural effusion or pneumothorax. IMPRESSION: Stable cardiomegaly and heterogeneous airspace opacities within the bilateral mid and lower lungs, right-greater-than-left. Electronically Signed   By: Annia Belt M.D.   On: 09/04/2015 13:08     Scheduled Meds: . amoxicillin-clavulanate  1 tablet Oral Q24H  . calcitRIOL  0.25 mcg Oral Q48H   And  . calcitRIOL  0.5 mcg Oral Q48H  . carvedilol  12.5 mg Oral BID WC  . docusate sodium  100 mg Oral BID  . hydrALAZINE  50 mg Oral TID  . insulin aspart  0-5 Units Subcutaneous QHS  . insulin aspart  0-9 Units Subcutaneous TID WC  . isosorbide mononitrate  60 mg Oral Daily  . levothyroxine  175 mcg Oral Daily  . olopatadine  1 drop Both Eyes Daily  . predniSONE  20 mg Oral Q breakfast  . rosuvastatin  10 mg Oral Daily  . sodium chloride  3 mL Intravenous Q12H  . tamsulosin  0.4 mg Oral Daily  . warfarin  2.5 mg Oral Once per day on Mon Wed Fri Sat  . warfarin  5 mg Oral Once per day on Sun Tue Thu  . Warfarin - Pharmacist Dosing Inpatient   Does not apply q1800   Continuous Infusions:   Time spent: 30 minutes   Author: Lynden Oxford, MD [facility] Pager: [phone] 09/05/2015 12:19 PM  If 7PM-7AM, please contact night-coverage at [email or web address], password [facility]

## 2015-09-05 NOTE — Consult Note (Signed)
Renal Service Consult Note [facility]  KAREN KINNARD 09/05/2015 Maree Krabbe Requesting Physician:  Dr Allena Katz, Demetrius Charity.   Reason for Consult:  Acute on CRF HPI: The patient is a 79 y.o. year-old with hx of CAD, HTN, HL, CVA , DM, gout who presented on 11/18 with gen weakness, bilat leg pain, in bed and couldn't care for self. Had recent admit w diagnosis of PNA and was dc'd on 11/16.  Family reported pt was more sleepy and lethargic. He has known hx of CKD followed by Dr Briant Cedar.  Most recent creatinine levels are 4-5 range.  Patient says "my family couldn't wake me up", suggests it was a new gout medicine that caused this.  No n/v/d, no abd pain, no dysuria, CP.  No SOB.   CXR shows early CHF.    Past Medical History  Past Medical History  Diagnosis Date  . CAD (coronary artery disease)     a. s/p PCI to mid and mid-distal RCA, 40% sten L main, no sig dz LCx  b. myoview 11/24/2014 inferoapex ischemia, medical management due to high risk for contrast nephropathy  . HTN (hypertension)   . Hyperlipidemia   . Cerebrovascular accident (HCC)   . CHF (congestive heart failure) (HCC)     EF 35% echo 2012 ; EF 44% myoview 2012; EF 76% by [facility] November 2014  . Diabetes mellitus   . 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 Unspecified hypothyroidism   . Gout   . Respiratory failure, acute (HCC) 07/21/2015   Past Surgical History  Past Surgical History  Procedure Laterality Date  . Thyroidectomy     Family History  Family History  Problem Relation Age of Onset  . Kidney failure Mother   . Diabetes Mother   . Cancer Father     lung  . Heart attack Brother     massive heart attack in 28s  . Hypertension Mother    Social History  reports that he quit smoking about 61 years ago. His smoking use included Cigarettes. He smoked 0.50 packs per day. He does not have any smokeless tobacco history on file. He reports that he does not drink alcohol or use illicit  drugs. Allergies  Allergies  Allergen Reactions  . Tomato Other (See Comments)    Too much acid - cannot take due to kidney issues   Home medications Prior to Admission medications   Medication Sig Start Date End Date Taking? Authorizing Provider  acetaminophen (TYLENOL) 500 MG tablet Take 500 mg by mouth 2 (two) times daily as needed (pain).    Yes Historical Provider, MD  amLODipine (NORVASC) 10 MG tablet Take 10 mg by mouth daily. 04/30/15  Yes Historical Provider, MD  amoxicillin-clavulanate (AUGMENTIN) 500-125 MG tablet Take 1 tablet (500 mg total) by mouth every 12 (twelve) hours. 09/01/15  Yes Richarda Overlie, MD  calcitRIOL (ROCALTROL) 0.25 MCG capsule Take 0.25-0.5 mcg by mouth daily. Take 1 tablet ( 0.45mcg) on odd days and  2 tablets (0.34mcg) on even days as directed   Yes Historical Provider, MD  carvedilol (COREG) 12.5 MG tablet Take 1 tablet (12.5 mg total) by mouth 2 (two) times daily with a meal. 01/11/15  Yes [name] Patty, MD  [facility] Liver Oil OIL Take 15 mLs by mouth daily.   Yes Historical Provider, MD  colchicine 0.6 MG tablet Take 0.5 tablets (0.3 mg total) by mouth daily. Patient taking differently: Take 0.3 mg by  mouth daily as needed (gout).  08/19/15  Yes [name], MD  furosemide (LASIX) 40 MG tablet Take 1.5 tablets (60 mg total) by mouth 2 (two) times daily. 09/01/15  Yes Richarda Overlie, MD  glipiZIDE (GLUCOTROL) 5 MG tablet Take 2.5-5 mg by mouth 2 (two) times daily before a meal. Take 1/2 tablet (2.5 mg) daily before breakfast and 1 tablet (5 mg) before supper 05/01/15  Yes Historical Provider, MD  hydrALAZINE (APRESOLINE) 50 MG tablet Take 1 tablet (50 mg total) by mouth 3 (three) times daily. 09/01/15  Yes Richarda Overlie, MD  isosorbide mononitrate (IMDUR) 60 MG 24 hr tablet Take 1 tablet (60 mg total) by mouth daily. 02/15/15  Yes [name], MD  levothyroxine (SYNTHROID, LEVOTHROID) 175 MCG tablet Take 1 tablet (175 mcg total) by mouth daily. 08/13/15  Yes  [name], MD  Magnesium 250 MG TABS Take 250 mg by mouth daily.   Yes Historical Provider, MD  nitroGLYCERIN (NITROSTAT) 0.4 MG SL tablet Place 1 tablet (0.4 mg total) under the tongue every 5 (five) minutes as needed for chest pain. 11/25/14 04/25/17 Yes Azalee Course, PA  olopatadine (PATANOL) 0.1 % ophthalmic solution Place 1 drop into both eyes 2 (two) times daily. Patient taking differently: Place 1 drop into both eyes daily.  04/20/15  Yes [name], MD  rosuvastatin (CRESTOR) 20 MG tablet Take 1 tablet (20 mg total) by mouth daily. 03/12/15  Yes [name], MD  tamsulosin (FLOMAX) 0.4 MG CAPS capsule Take 1 capsule (0.4 mg total) by mouth daily. 09/01/15  Yes Richarda Overlie, MD  warfarin (COUMADIN) 5 MG tablet Take 1 tablet (5 mg total) by mouth daily. Patient taking differently: Take 2.5-5 mg by mouth daily. Take 1 tablet (5 mg) by mouth Sunday, Tuesday, Thursday, take 1/2 tablet (2.5 mg) on Monday, Wednesday, Friday and Saturday 07/23/15  Yes Calvert Cantor, MD   Liver Function Tests  Recent Labs Lab 08/31/15 0350 09/04/15 0345 09/05/15 0156  AST 20 57* 55*  ALT 17 34 34  ALKPHOS 42 57 57  BILITOT 0.5 0.4 0.6  PROT 5.5* 5.6* 6.1*  ALBUMIN 2.8* 2.5* 2.4*   No results for input(s): LIPASE, AMYLASE in the last 168 hours. CBC  Recent Labs Lab 09/03/15 1356 09/03/15 1406 09/04/15 0345 09/05/15 0156  WBC 5.9  --  4.0 4.6  NEUTROABS 3.8  --   --   --   HGB 9.2* 10.5* 8.6* 8.5*  HCT 28.7* 31.0* 26.8* 26.3*  MCV 92.9  --  93.4 92.9  PLT 118*  --  121* 123*   Basic Metabolic Panel  Recent Labs Lab 08/30/15 0438 08/31/15 0350 09/01/15 0244 09/03/15 1406 09/04/15 0345 09/05/15 0156  NA 142 138 137 138 137 137  K 4.8 5.0 5.2* 4.8 5.2* 5.2*  CL 114* 111 109 108 108 108  CO2 17* 18* 17*  --  16* 16*  GLUCOSE 86 103* 110* 86 182* 202*  BUN 77* 75* 79* 97* 107* 119*  CREATININE 4.55* 4.71* 4.79* 5.90* 6.22* 6.60*  CALCIUM 9.1 8.5* 8.4*  --  8.6* 8.5*    Filed  Vitals:   09/04/15 1959 09/04/15 2341 09/05/15 0427 09/05/15 1351  BP: 110/62 110/60 108/66 121/64  Pulse: 65 67 62 64  Temp: 98.1 F (36.7 C)  98 F (36.7 C) 97.3 F (36.3 C)  TempSrc: Oral  Oral Oral  Resp: Height:      Weight:  85.2 kg (187 lb 13.3 oz)   SpO2: 98% 96% 98% 97%   Exam Awake, a bit lethargic, no asterixis, no distress No rash, cyanosis or gangrene Sclera anicteric, throat clear +JVD Chest bibasilar rales RRR 2/6 sem no RG Abd soft ntnd no ascites GU normal male Ext diffuse 2+ pitting edema lower/ upper legs bilat Neuro is nonfocal , gen weak , Ox 3 and appopriate    Assessment: 1. CKD stage 5 - uremic symptoms, needs to start dialysis.  Patient agrees to start dialysis. Will order IV lasix and IR to see for placement of tunneled HD catheter.  2. Vol excess - as above 3.  HTN on coreg/ hydralazine here. Will decrease BP meds, let BP come up for HD/ diuresis 4. CM EF 35-45% 5. DM2 6. Gout 7. Anemia Hb 8.5, check Fe status   Plan- as above  Vinson Moselleob Finneas Mathe MD [facility] pager [phone]    cell [phone] 09/05/2015, 5:50 PM

## 2015-09-05 NOTE — Evaluation (Signed)
Physical Therapy Evaluation Patient Details Name: [name] [name] MRN: [id number] DOB: 1936/05/23 Today's Date: 09/05/2015   History of Present Illness  79 y.o. male with Acute on chronic kidney failure, Metabolic acidosis, and gout, admitted with acute encephalopathy.  Clinical Impression  Pt admitted with the above complications. Pt currently with functional limitations due to the deficits listed below (see PT Problem List). From previous admissions, patient requiring similar amount of assist. Apparently not very mobile at home due to pain. Required moderate assist for transfer to stand. Feel he would do well in a SNF for rehabilitation to improve his functional abilities, safety and independence with mobility.  Pt will benefit from skilled PT acutely while admitted in hospital to address these functional deficits.       Follow Up Recommendations SNF    Equipment Recommendations  None recommended by PT    Recommendations for Other Services       Precautions / Restrictions Precautions Precautions: Fall Restrictions Weight Bearing Restrictions: No      Mobility  Bed Mobility Overal bed mobility: Needs Assistance Bed Mobility: Rolling;Sidelying to Sit;Sit to Sidelying Rolling: Supervision Sidelying to sit: Mod assist;HOB elevated     Sit to sidelying: Min assist General bed mobility comments: supervision to roll with use of rail and cues for technqiue. Mod assist for truncal support and LE support to bring off of bed. Min assist for LE support into bed.  Transfers Overall transfer level: Needs assistance Equipment used: Rolling walker (2 wheeled) Transfers: Sit to/from Stand Sit to Stand: Mod assist         General transfer comment: Mod assist for boost to stand from low bed setting. VC for hand placement and to lean anteriorly.  Very slow to rise  Ambulation/Gait Ambulation/Gait assistance: Min assist Ambulation Distance (Feet): 5 Feet Assistive device: Rolling  walker (2 wheeled) Gait Pattern/deviations: Step-to pattern;Decreased stride length;Shuffle;Trunk flexed;Antalgic Gait velocity: slow Gait velocity interpretation: <1.8 ft/sec, indicative of risk for recurrent falls General Gait Details: Took several steps forward and backwards with min assist for balance. Leaning over RW heavily. Very antlagic patter reporting increased knee pain. Very slow. Cues for upright posture and proper/safe DME use with a rolling walker.  Stairs            Wheelchair Mobility    Modified Rankin (Stroke Patients Only)       Balance Overall balance assessment: Needs assistance Sitting-balance support: No upper extremity supported;Feet supported Sitting balance-Leahy Scale: Good     Standing balance support: Bilateral upper extremity supported Standing balance-Leahy Scale: Poor                               Pertinent Vitals/Pain Pain Assessment:  ("I don't really have any right now") Pain Location:  (no pain at rest. Pain in BIL knees with movement) Pain Intervention(s): Monitored during session;Repositioned    Home Living Family/patient expects to be discharged to:: Private residence Living Arrangements: Spouse/significant other Available Help at Discharge: Family;Available 24 hours/day Type of Home: House Home Access: Stairs to enter Entrance Stairs-Rails: Left Entrance Stairs-Number of Steps: 2 Home Layout: One level Home Equipment: Cane - quad;Hand held Careers information officer - 2 wheels      Prior Function Level of Independence: Independent with assistive device(s)         Comments: per pt he sometimes uses an assistive device, it depends on the day, gets down in the tub when he feels well  Hand Dominance   Dominant Hand: Right    Extremity/Trunk Assessment   Upper Extremity Assessment: Defer to OT evaluation           Lower Extremity Assessment: Generalized weakness (limited by pain)          Communication   Communication: No difficulties  Cognition Arousal/Alertness: Awake/alert Behavior During Therapy: WFL for tasks assessed/performed Overall Cognitive Status: Within Functional Limits for tasks assessed                      General Comments      Exercises General Exercises - Lower Extremity Ankle Circles/Pumps: AROM;Both;10 reps;Seated Long Arc Quad: Strengthening;AROM;Both;15 reps;Seated      Assessment/Plan    PT Assessment Patient needs continued PT services  PT Diagnosis Difficulty walking;Abnormality of gait;Generalized weakness;Acute pain   PT Problem List Decreased strength;Decreased range of motion;Decreased activity tolerance;Decreased balance;Decreased mobility;Decreased knowledge of use of DME;Pain  PT Treatment Interventions DME instruction;Gait training;Functional mobility training;Therapeutic activities;Therapeutic exercise;Balance training;Patient/family education;Neuromuscular re-education   PT Goals (Current goals can be found in the Care Plan section) Acute Rehab PT Goals Patient Stated Goal: Get rehab PT Goal Formulation: With patient Time For Goal Achievement: 09/19/15 Potential to Achieve Goals: Fair    Frequency Min 2X/week   Barriers to discharge   Pt will likely require higher level of assist than family can safely provide    Co-evaluation               End of Session Equipment Utilized During Treatment: Gait belt Activity Tolerance: Patient limited by pain Patient left: in bed;with call bell/phone within reach;with bed alarm set;with family/visitor present Nurse Communication: Mobility status         Time: [id number] PT Time Calculation (min) (ACUTE ONLY): 25 min   Charges:   PT Evaluation $Initial PT Evaluation Tier I: 1 Procedure PT Treatments $Therapeutic Activity: 8-22 mins   PT G Codes[place]erton Mount:        Gabrielly Mccrystal S 09/05/2015, 5:26 PM  Sunday SpillersLogan Secor [place], [place]PT [phone]

## 2015-09-05 NOTE — Progress Notes (Signed)
Speaking with patient about his difficulty waking yesterday (11/19).  I asked him if he took any medicine at home to relieve his knee pain after his discharge on 11/16.  He responded that he took one gout pill and one pain pill, 2 different times on 11/16.  When I questioned him about what type of pain pill, he said "something you have to sign for at the pharmacy."

## 2015-09-05 NOTE — Progress Notes (Signed)
ANTICOAGULATION CONSULT NOTE Pharmacy Consult for warfarin Indication: atrial fibrillation  Allergies  Allergen Reactions  . Tomato Other (See Comments)    Too much acid - cannot take due to kidney issues   Labs:  Recent Labs  09/03/15 1356 09/03/15 1406 09/03/15 1533 09/04/15 0345 09/04/15 1250 09/05/15 0156  HGB 9.2* 10.5*  --  8.6*  --  8.5*  HCT 28.7* 31.0*  --  26.8*  --  26.3*  PLT 118*  --   --  121*  --  123*  LABPROT  --   --  23.7* 26.9*  --  29.1*  INR  --   --  2.14* 2.53*  --  2.80*  CREATININE  --  5.90*  --  6.22*  --  6.60*  TROPONINI  --   --   --   --  <0.03  --     Estimated Creatinine Clearance: 10.1 mL/min (by C-G formula based on Cr of 6.6).   Assessment: 79 yo m presenting to the ED on 11/18 with weakness.  Patient is on warfarin at home for afib.  PTA dose is 5 mg on Sun, Tues, Thurs and 2.5 mg on MWF and Saturday.   INR trending up on home dose  Reduced Crestor for worsening renal failure to 10 mg po daily   Goal of Therapy:  INR 2-3 Monitor platelets by anticoagulation protocol: Yes   Plan:  Discontinue home dose Coumadin 2.5 mg po x 1 dose tonight Daily INR Monitor for s/sx of bleeding  Thank you Okey RegalLisa Indiana Pechacek, PharmD [phone]  09/05/2015 12:51 PM

## 2015-09-06 ENCOUNTER — Ambulatory Visit (HOSPITAL_COMMUNITY): Payer: Medicare Other

## 2015-09-06 DIAGNOSIS — N186 End stage renal disease: Secondary | ICD-10-CM

## 2015-09-06 DIAGNOSIS — N184 Chronic kidney disease, stage 4 (severe): Secondary | ICD-10-CM

## 2015-09-06 LAB — GLUCOSE, CAPILLARY
GLUCOSE-CAPILLARY: 123 mg/dL — AB (ref 65–99)
GLUCOSE-CAPILLARY: 145 mg/dL — AB (ref 65–99)
Glucose-Capillary: 189 mg/dL — ABNORMAL HIGH (ref 65–99)
Glucose-Capillary: 243 mg/dL — ABNORMAL HIGH (ref 65–99)

## 2015-09-06 LAB — IRON AND TIBC
Iron: 16 ug/dL — ABNORMAL LOW (ref 45–182)
SATURATION RATIOS: 6 % — AB (ref 17.9–39.5)
TIBC: 266 ug/dL (ref 250–450)
UIBC: 250 ug/dL

## 2015-09-06 LAB — CBC WITH DIFFERENTIAL/PLATELET
BASOS PCT: 0 %
Basophils Absolute: 0 10*3/uL (ref 0.0–0.1)
EOS ABS: 0.3 10*3/uL (ref 0.0–0.7)
Eosinophils Relative: 4 %
HEMATOCRIT: 27.6 % — AB (ref 39.0–52.0)
HEMOGLOBIN: 8.9 g/dL — AB (ref 13.0–17.0)
LYMPHS ABS: 0.8 10*3/uL (ref 0.7–4.0)
Lymphocytes Relative: 13 %
MCH: 29.7 pg (ref 26.0–34.0)
MCHC: 32.2 g/dL (ref 30.0–36.0)
MCV: 92 fL (ref 78.0–100.0)
MONOS PCT: 5 %
Monocytes Absolute: 0.3 10*3/uL (ref 0.1–1.0)
NEUTROS ABS: 5 10*3/uL (ref 1.7–7.7)
NEUTROS PCT: 78 %
Platelets: 144 10*3/uL — ABNORMAL LOW (ref 150–400)
RBC: 3 MIL/uL — AB (ref 4.22–5.81)
RDW: 14.9 % (ref 11.5–15.5)
WBC: 6.4 10*3/uL (ref 4.0–10.5)

## 2015-09-06 LAB — COMPREHENSIVE METABOLIC PANEL
ALBUMIN: 2.6 g/dL — AB (ref 3.5–5.0)
ALK PHOS: 55 U/L (ref 38–126)
ALT: 39 U/L (ref 17–63)
AST: 50 U/L — AB (ref 15–41)
Anion gap: 13 (ref 5–15)
BILIRUBIN TOTAL: 0.5 mg/dL (ref 0.3–1.2)
BUN: 130 mg/dL — AB (ref 6–20)
CALCIUM: 8.7 mg/dL — AB (ref 8.9–10.3)
CO2: 15 mmol/L — AB (ref 22–32)
CREATININE: 6.8 mg/dL — AB (ref 0.61–1.24)
Chloride: 106 mmol/L (ref 101–111)
GFR calc Af Amer: 8 mL/min — ABNORMAL LOW (ref >60)
GFR calc non Af Amer: 7 mL/min — ABNORMAL LOW (ref >60)
GLUCOSE: 236 mg/dL — AB (ref 65–99)
Potassium: 5.5 mmol/L — ABNORMAL HIGH (ref 3.5–5.1)
SODIUM: 134 mmol/L — AB (ref 135–145)
TOTAL PROTEIN: 6.2 g/dL — AB (ref 6.5–8.1)

## 2015-09-06 LAB — TYPE AND SCREEN
ABO/RH(D): A POS
Antibody Screen: NEGATIVE

## 2015-09-06 LAB — ABO/RH: ABO/RH(D): A POS

## 2015-09-06 LAB — PROTIME-INR
INR: 2.53 — ABNORMAL HIGH (ref 0.00–1.49)
INR: 2.01 — AB (ref 0.00–1.49)
PROTHROMBIN TIME: 26.9 s — AB (ref 11.6–15.2)
Prothrombin Time: 22.6 seconds — ABNORMAL HIGH (ref 11.6–15.2)

## 2015-09-06 MED ORDER — VITAMIN K1 10 MG/ML IJ SOLN
5.0000 mg | Freq: Once | INTRAMUSCULAR | Status: AC
  Administered 2015-09-06: 5 mg via INTRAVENOUS
  Filled 2015-09-06: qty 0.5

## 2015-09-06 MED ORDER — DEXTROSE 5 % IV SOLN
2.5000 mg | Freq: Once | INTRAVENOUS | Status: AC
  Administered 2015-09-06: 2.5 mg via INTRAVENOUS
  Filled 2015-09-06: qty 0.25

## 2015-09-06 MED ORDER — CEFAZOLIN SODIUM-DEXTROSE 2-3 GM-% IV SOLR
2.0000 g | INTRAVENOUS | Status: DC
  Filled 2015-09-06: qty 50

## 2015-09-06 MED ORDER — ALLOPURINOL 100 MG PO TABS
100.0000 mg | ORAL_TABLET | Freq: Every day | ORAL | Status: DC
  Administered 2015-09-06 – 2015-09-09 (×4): 100 mg via ORAL
  Filled 2015-09-06 (×4): qty 1

## 2015-09-06 MED ORDER — CALCIUM ACETATE (PHOS BINDER) 667 MG PO CAPS
667.0000 mg | ORAL_CAPSULE | Freq: Three times a day (TID) | ORAL | Status: DC
  Administered 2015-09-06 (×2): 667 mg via ORAL
  Filled 2015-09-06 (×2): qty 1

## 2015-09-06 MED ORDER — CARVEDILOL 6.25 MG PO TABS
6.2500 mg | ORAL_TABLET | Freq: Two times a day (BID) | ORAL | Status: DC
  Administered 2015-09-06 – 2015-09-07 (×2): 6.25 mg via ORAL
  Filled 2015-09-06 (×2): qty 1

## 2015-09-06 MED ORDER — RENA-VITE PO TABS
1.0000 | ORAL_TABLET | Freq: Every day | ORAL | Status: DC
  Administered 2015-09-06 – 2015-09-12 (×7): 1 via ORAL
  Filled 2015-09-06 (×7): qty 1

## 2015-09-06 MED ORDER — SODIUM CHLORIDE 0.9 % IV SOLN
125.0000 mg | Freq: Every day | INTRAVENOUS | Status: AC
  Administered 2015-09-06 – 2015-09-12 (×8): 125 mg via INTRAVENOUS
  Filled 2015-09-06 (×13): qty 10

## 2015-09-06 MED ORDER — SODIUM CHLORIDE 0.9 % IV SOLN
Freq: Once | INTRAVENOUS | Status: AC
  Administered 2015-09-06: 09:00:00 via INTRAVENOUS

## 2015-09-06 MED ORDER — CEFAZOLIN SODIUM-DEXTROSE 2-3 GM-% IV SOLR
2.0000 g | INTRAVENOUS | Status: AC
  Administered 2015-09-07: 2000 mg via INTRAVENOUS
  Filled 2015-09-06: qty 50

## 2015-09-06 MED ORDER — VITAMIN K1 10 MG/ML IJ SOLN
2.5000 mg | Freq: Once | INTRAVENOUS | Status: DC
  Filled 2015-09-06: qty 0.25

## 2015-09-06 NOTE — Progress Notes (Signed)
VASCULAR LAB PRELIMINARY  PRELIMINARY  PRELIMINARY  PRELIMINARY  Right  Upper Extremity Vein Map    Cephalic  Segment Diameter Depth Comment  1. Axilla 5.6632mm mm   2. Mid upper arm 4.5217mm mm   3. Above AC 4.5107mm mm   4. In AC 4.6603mm mm   5. Below AC 3.52mm mm   6. Mid forearm 2.4283mm mm   7. Wrist 2.7441mm mm    mm mm    mm mm    mm mm    Basilic  Segment Diameter Depth Comment  1. Axilla 2.1079mm 10.453mm   2. Mid upper arm 2.6195mm 10.265mm   3. Above [facility] 3.5725mm 8.3965mm   4. In AC mm mm   5. Below AC mm mm   6. Mid forearm mm mm   7. Wrist mm mm    mm mm    mm mm    mm mm    Left Upper Extremity Vein Map    Cephalic  Segment Diameter Depth Comment  1. Axilla 6.732mm mm   2. Mid upper arm 5.649mm mm Branch measures 3.9016mm  3. Above AC 3.5786mm mm   4. In AC 6.2794mm mm   5. Below AC 2.1574mm mm   6. Mid forearm 2.437mm mm   7. Wrist 2.9841mm mm    mm mm    mm mm    mm mm    Basilic  Segment Diameter Depth Comment  1. Axilla mm mm   2. Mid upper arm 5.3039mm 15.738mm   3. [facility] 4.3713[facility] 14.396mm   4. In AC 3.1183mm mm   5. Below AC mm mm   6. Mid forearm mm mm   7. Wrist mm mm    mm mm    mm mm    mm mm     Erynn Vaca, RVT 09/06/2015, 5:43 PM

## 2015-09-06 NOTE — Progress Notes (Signed)
Patient ID: [name] [name], male   DOB: 03/01/1936, 79 y.o.   MRN: [id number] Pt's PT/INR post vit K,FFP still elevated at 22.6/2.01. Tunneled HD catheter placement on hold until INR<1.6. Will cont to monitor and tent schedule for 11/22. Nurse aware.

## 2015-09-06 NOTE — Clinical Documentation Improvement (Addendum)
Internal Medicine Vascular Surgery Nephrology/Renal  Can the diagnosis of CHF be further specified?    Acuity - Acute, Chronic, Acute on Chronic   Type - Systolic, Diastolic, Systolic and Diastolic  Other  Clinically Undetermined   Document any associated diagnoses/conditions   Supporting Information: Current documentation reflects history of CHF, recent admission for acute on chronic combined CHF.  Please document the acuity / type of CHF present this admission.  Lasix ordered this admission.     Please exercise your independent, professional judgment when responding. A specific answer is not anticipated or expected. Please update your documentation within the medical record to reflect your response to this query.   Thank you, Doy MinceVangela Swofford, RN [phone] Clinical Documentation Specialist  The patient does not have any evidence of heart failure on this admission-Lasix was used by nephrology  Pleas KochJai Forest Redwine, MD [facility] (P) [phone]

## 2015-09-06 NOTE — Progress Notes (Signed)
Subjective: Interval History: has no complaint ready to proceed.  Objective: Vital signs in last 24 hours: Temp:  [97.3 F (36.3 C)-98.5 F (36.9 C)] 97.6 F (36.4 C) (11/21 16100632) Pulse Rate:  [63-64] 64 (11/21 0632) Resp:  [18-20] 18 (11/21 [id number]) BP: (117-131)/(64-75) 131/64 mmHg (11/21 0632) SpO2:  [97 %-100 %] 100 % (11/21 54090632) Weight:  [90 kg (198 lb 6.6 oz)] 90 kg (198 lb 6.6 oz) (11/21 [id number]) Weight change: 4.8 kg (10 lb 9.3 oz)  Intake/Output from previous day: 11/20 0701 - 11/21 0700 In: 1729.5 [P.O.:1060; I.V.:537.5; IV Piggyback:132] Out: 330 [Urine:330] Intake/Output this shift:    General appearance: alert, cooperative, moderately obese and pale Resp: diminished breath sounds bilaterally and rales bibasilar Cardio: S1, S2 normal and systolic murmur: holosystolic 2/6, blowing at apex GI: pos bs, soft, liver down 5 cm Extremities: edema 3+  Lab Results:  Recent Labs  09/05/15 0156 09/06/15 0348  WBC 4.6 6.4  HGB 8.5* 8.9*  HCT 26.3* 27.6*  PLT 123* 144*   BMET:  Recent Labs  09/05/15 0156 09/06/15 0348  NA 137 134*  K 5.2* 5.5*  CL 108 106  CO2 16* 15*  GLUCOSE 202* 236*  BUN 119* 130*  CREATININE 6.60* 6.80*  CALCIUM 8.5* 8.7*   No results for input(s): PTH in the last 72 hours. Iron Studies:  Recent Labs  09/06/15 0348  IRON 16*  TIBC 266    Studies/Results: Dg Chest Port 1 View  09/04/2015  CLINICAL DATA:  Patient with weakness.  On Coumadin. EXAM: PORTABLE CHEST 1 VIEW COMPARISON:  Chest radiograph 09/03/2015 FINDINGS: Monitoring leads overlie the patient. Stable marked cardiomegaly. No significant interval change bilateral mid and lower lung heterogeneous opacities. No large pleural effusion or pneumothorax. IMPRESSION: Stable cardiomegaly and heterogeneous airspace opacities within the bilateral mid and lower lungs, right-greater-than-left. Electronically Signed   By: [name]  [name]   On: 09/04/2015 13:08    I have reviewed the  patient's current medications.  Assessment/Plan: 1 ESRD new , needs access acute and chronic.  Vol xs, will do HD after cath placed.  Needs Hep serologies, perm access, CLIP 2 Anemia Fe low, start and give IV 3 HPTH check 4 HTN lower meds and vol 5 Nonadherence 6 Gout start Allopurinol 7 DM controlled 8 Hypothyroid 9 Hx CVA P PC, perm access, HD, vit, serologies, esa, Fe, PTH    LOS: 2 days   [name] [name] 09/06/2015,10:09 AM

## 2015-09-06 NOTE — Progress Notes (Signed)
[facility] Progress Note    Patient: [name] [name]     GNF:[id number]  DOB: 1936-03-30     DOA: 09/03/2015 Date of Service: the patient was seen and examined on 09/06/2015  Subjective: Patient was drowsy in the morning. Denies having any acute chest pain or abdominal pain. No nausea no vomiting episode yesterday. No diarrhea episode yesterday. Nutrition: Able to tolerate oral diet. Activity: Walking around in the room.  Last BM: 09/06/2015  Assessment and Plan: 1. Acute on chronic kidney failure [facility]) Patient presents with complaints of worsening renal function with confusion. Renal function continues to further worsen. Nephrology the patient and recommend to start hemodialysis as well as Lasix. The patient will undergo DIALYSIS CATHETER PLACEMENT. Due to his elevated INR patient will be given vitamin K as well as FFP. Discontinue IV fluids.  2. Metabolic acidosis Increased shortness of breath. The patient appears to be tachypneic on my examination. Most like is secondary to metabolic acidosis from worsening renal function causing him to hyperventilate to compensate. Continue oxygen as needed. Most likely will improve with hemodialysis.  3. gout. Continue with prednisone. Start on low-dose tramadol. Most likely with improvement with hemodialysis  4. acute encephalopathy. An episode of unresponsiveness. Patient initially presented with confusion and acute encephalopathy which was thought to be secondary to narcotics. Currently the patient is hemodynamically and neurologically stable. We'll continue close monitoring on telemetry. Most likely this is toxic metabolic and nephropathy secondary to uremia. Continue HD preparation.  5  Hypothyroidism TSH within normal limit. Continue Synthroid.  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  7  Essential  hypertension Discontinue amlodipine in the setting of pedal edema as well as normal blood pressure.  8  Atrial fibrillation (HCC) Currently rate controlled. INR therapeutic. We will reverse the warfarin with vitamin K as well as FFP for [facility] placement  9  HCAP (healthcare-associated pneumonia) Patient had pneumonia last admission which is currently improving. Continuing Augmentin to complete the course.  DVT Prophylaxis: on therapeutic anticoagulation. Nutrition: Renal diet   Advance goals of care discussion: Full code  Brief Summary of Hospitalization:  HPI: From the H&P "[name] [name] is a 79 y.o. male with multiple medical problems not limited to atrial fibrillation on Coumadin, diabetes, hypertension ,coronary artery disease, ischemic cardiomyopathy and chronic kidney disease. Patient was discharged from the hospital 2 days ago after a three-day stay for HCAP and acute on chronic combined heart failure. Patient was discharged home on Augmentin. Home health nurse recommended patient return to emergency department today. Patient really has not been out of bed since hospital discharge. Family having problems eating rolling him over in bed secondary to weakness patient denies chest pain, shortness of breath, cough, fever or chills. Patient states he has been eating and drinking. He reports good urine output, wife thinks urine output might be slightly less than normal. " Daily update: Admitted on 09/03/2015 with confusion most at this secondary to medication. Improved but had one episode of unresponsiveness on 09/04/2015. 09/05/2015 nephrology consulted amlodipine discontinued  09/06/2015. Renal function continues to worsen. Patient will be given vitamin K and FFP to reverse the INR.  Consultants: Nephrology  Procedures: [facility] placement today scheduled  Antibiotics: Augmentin 09/01/2015  start date  Disposition:  Expected discharge date: 09/08/2015  Barriers to safe discharge: Renal  function   Intake/Output Summary (Last 24 hours) at 09/06/15 1427 Last data filed at  09/06/15 1245  Gross per 24 hour  Intake    744 ml  Output    255 ml  Net    489 ml   Filed Weights   09/04/15 0535 09/05/15 0427 09/06/15 [id number]  Weight: 85.99 kg (189 lb 9.2 oz) 85.2 kg (187 lb 13.3 oz) 90 kg (198 lb 6.6 oz)    Objective: Physical Exam: Filed Vitals:   09/06/15 1145 09/06/15 1245 09/06/15 1255 09/06/15 1315  BP: 118/68 124/68 122/64 123/70  Pulse: 63 63 63 63  Temp: 98.1 F (36.7 C) 97.4 F (36.3 C) 97.8 F (36.6 C) 97.7 F (36.5 C)  TempSrc: Oral Oral Oral Oral  Resp: 18 18 18 18   Height:      Weight:      SpO2:       General: Appear in moderate distress, no Rash; Oral Mucosa moist. Cardiovascular: S1 and S2 Present, no Murmur, difficult to assess  JVD Respiratory: Bilateral Air entry present and  basal Crackles, no wheezes Abdomen: Bowel Sound present, Soft and no tenderness Extremities: Bilateral  Pedal edema, no calf tenderness Neurology: Grossly no focal neuro deficit.  Data Reviewed: CBC:  Recent Labs Lab 09/01/15 0244 09/03/15 1356 09/03/15 1406 09/04/15 0345 09/05/15 0156 09/06/15 0348  WBC 5.2 5.9  --  4.0 4.6 6.4  NEUTROABS  --  3.8  --   --   --  5.0  HGB 9.0* 9.2* 10.5* 8.6* 8.5* 8.9*  HCT 28.1* 28.7* 31.0* 26.8* 26.3* 27.6*  MCV 94.3 92.9  --  93.4 92.9 92.0  PLT 112* 118*  --  121* 123* 144*   Basic Metabolic Panel:  Recent Labs Lab 08/31/15 0350 09/01/15 0244 09/03/15 1406 09/04/15 0345 09/05/15 0156 09/06/15 0348  NA 138 137 138 137 137 134*  K 5.0 5.2* 4.8 5.2* 5.2* 5.5*  CL 111 109 108 108 108 106  CO2 18* 17*  --  16* 16* 15*  GLUCOSE 103* 110* 86 182* 202* 236*  BUN 75* 79* 97* 107* 119* 130*  CREATININE 4.71* 4.79* 5.90* 6.22* 6.60* 6.80*  CALCIUM 8.5* 8.4*  --  8.6* 8.5* 8.7*   Liver Function Tests:  Recent Labs Lab 08/31/15 0350 09/04/15 0345 09/05/15 0156 09/06/15 0348  AST 20 57* 55* 50*  ALT 17 34 34 39   ALKPHOS 42 57 57 55  BILITOT 0.5 0.4 0.6 0.5  PROT 5.5* 5.6* 6.1* 6.2*  ALBUMIN 2.8* 2.5* 2.4* 2.6*   No results for input(s): LIPASE, AMYLASE in the last 168 hours. No results for input(s): AMMONIA in the last 168 hours.  Cardiac Enzymes:  Recent Labs Lab 09/04/15 1250  TROPONINI <0.03   BNP (last 3 results)  Recent Labs  12/10/14 0530 08/29/15 1235 09/03/15 1356  BNP 326.5* 1483.1* 1191.8*    ProBNP (last 3 results) No results for input(s): PROBNP in the last 8760 hours.   CBG:  Recent Labs Lab 09/05/15 1136 09/05/15 1605 09/05/15 2035 09/06/15 0621 09/06/15 1119  GLUCAP 168* 212* 244* 189* 123*    Recent Results (from the past 240 hour(s))  Urine culture     Status: None   Collection Time: 08/29/15  1:53 PM  Result Value Ref Range Status   Specimen Description URINE, CLEAN CATCH  Final   Special Requests NONE  Final   Culture MULTIPLE SPECIES PRESENT, SUGGEST RECOLLECTION  Final   Report Status 08/31/2015 FINAL  Final  Culture, blood (routine x 2) Call MD if unable to obtain prior to antibiotics being  given     Status: None   Collection Time: 08/29/15  7:00 PM  Result Value Ref Range Status   Specimen Description BLOOD LEFT ANTECUBITAL  Final   Special Requests BOTTLES DRAWN AEROBIC AND ANAEROBIC 10CC  Final   Culture NO GROWTH 5 DAYS  Final   Report Status 09/03/2015 FINAL  Final  Culture, blood (routine x 2) Call MD if unable to obtain prior to antibiotics being given     Status: None   Collection Time: 08/29/15  7:05 PM  Result Value Ref Range Status   Specimen Description BLOOD LEFT ARM  Final   Special Requests BOTTLES DRAWN AEROBIC AND ANAEROBIC 10CC  Final   Culture NO GROWTH 5 DAYS  Final   Report Status 09/03/2015 FINAL  Final  MRSA PCR Screening     Status: None   Collection Time: 09/01/15  9:06 AM  Result Value Ref Range Status   MRSA by PCR NEGATIVE NEGATIVE Final    Comment:        The GeneXpert MRSA Assay (FDA approved for NASAL  specimens only), is one component of a comprehensive MRSA colonization surveillance program. It is not intended to diagnose MRSA infection nor to guide or monitor treatment for MRSA infections.      Studies: No results found.   Scheduled Meds: . allopurinol  100 mg Oral Daily  . amoxicillin-clavulanate  1 tablet Oral Q24H  . calcitRIOL  0.25 mcg Oral Q48H   And  . calcitRIOL  0.5 mcg Oral Q48H  . calcium acetate  667 mg Oral TID WC  . carvedilol  6.25 mg Oral BID WC  .  ceFAZolin (ANCEF) IV  2 g Intravenous to XRAY  . docusate sodium  100 mg Oral BID  . ferric gluconate (FERRLECIT/NULECIT) IV  125 mg Intravenous Daily  . insulin aspart  0-5 Units Subcutaneous QHS  . insulin aspart  0-9 Units Subcutaneous TID WC  . isosorbide mononitrate  60 mg Oral Daily  . levothyroxine  175 mcg Oral Daily  . multivitamin  1 tablet Oral QHS  . olopatadine  1 drop Both Eyes Daily  . predniSONE  20 mg Oral Q breakfast  . rosuvastatin  10 mg Oral Daily  . sodium chloride  3 mL Intravenous Q12H  . tamsulosin  0.4 mg Oral Daily  . Warfarin - Pharmacist Dosing Inpatient   Does not apply q1800   Continuous Infusions:   Time spent: 35 minutes   Author: Lynden Oxford, MD [facility] Pager: [phone] 09/06/2015 2:27 PM  If 7PM-7AM, please contact night-coverage at [email or web address], password [facility]

## 2015-09-06 NOTE — Progress Notes (Addendum)
ANTICOAGULATION CONSULT NOTE - Follow Up Consult  Pharmacy Consult for coumadin Indication: atrial fibrillation  Allergies  Allergen Reactions  . Tomato Other (See Comments)    Too much acid - cannot take due to kidney issues    Patient Measurements: Height: 5\' 9"  (175.3 cm) Weight: 198 lb 6.6 oz (90 kg) (bedscale) IBW/kg (Calculated) : 70.7  Vital Signs: Temp: 97.6 F (36.4 C) (11/21 0632) Temp Source: Oral (11/21 09810632) BP: 131/64 mmHg (11/21 0632) Pulse Rate: 64 (11/21 0632)  Labs:  Recent Labs  09/04/15 0345 09/04/15 1250 09/05/15 0156 09/06/15 0348  HGB 8.6*  --  8.5* 8.9*  HCT 26.8*  --  26.3* 27.6*  PLT 121*  --  123* 144*  LABPROT 26.9*  --  29.1* 26.9*  INR 2.53*  --  2.80* 2.53*  CREATININE 6.22*  --  6.60* 6.80*  TROPONINI  --  <0.03  --   --     Estimated Creatinine Clearance: 10.1 mL/min (by C-G formula based on Cr of 6.8).   Medications:  Scheduled:  . sodium chloride   Intravenous Once  . amoxicillin-clavulanate  1 tablet Oral Q24H  . calcitRIOL  0.25 mcg Oral Q48H   And  . calcitRIOL  0.5 mcg Oral Q48H  . carvedilol  12.5 mg Oral BID WC  . docusate sodium  100 mg Oral BID  . ferric gluconate (FERRLECIT/NULECIT) IV  125 mg Intravenous Daily  . furosemide  160 mg Intravenous 4 times per day  . insulin aspart  0-5 Units Subcutaneous QHS  . insulin aspart  0-9 Units Subcutaneous TID WC  . isosorbide mononitrate  60 mg Oral Daily  . levothyroxine  175 mcg Oral Daily  . olopatadine  1 drop Both Eyes Daily  . phytonadione (VITAMIN K) IV  2.5 mg Intravenous Once  . predniSONE  20 mg Oral Q breakfast  . rosuvastatin  10 mg Oral Daily  . sodium chloride  3 mL Intravenous Q12H  . tamsulosin  0.4 mg Oral Daily  . Warfarin - Pharmacist Dosing Inpatient   Does not apply q1800    Assessment: 79 yo male with acute on chronic renal failure.  He is on coumadin PTA for afib.  -He is noted with new ESRD to start HD and for HD cath placement today (IV  vitamin K and FFP ordered)  PTA Coumadin dose: 5 mg on Sun, Tues, Thurs and 2.5 mg on MWF and Saturday.   Goal of Therapy:  INR 2-3 Monitor platelets by anticoagulation protocol: Yes   Plan:  -Hold coumadin today -Will follow plans post procedure -Daily PT/INR  Harland GermanAndrew Derrious Bologna, Pharm D 09/06/2015 9:55 AM

## 2015-09-06 NOTE — Consult Note (Signed)
Patient name: [name] [name] MRN: [id number] DOB: [birth year] Sex: male  REASON FOR CONSULT: permanent dialysis access  CONSULTING PHYSICIAN: Dr. Darrick Penna  HPI: [name] [name] is a 79 y.o. male, who we've been consulted for permanent dialysis access. He will be starting dialysis today. The patient is right handed. He has never had access procedures before. He does not have a pacemaker.  The patient was admitted to the hospital on 09/03/15 after the family reported that he was progressively lethargic and sleepy. He was discharged two days prior for HCAP. The patient has a past medical history of atrial fibrillation on coumadin, CKD stage 4-5, CAD s/p PCI (11/24/14), type II diabetes, hypertension, hyperlipidemia and gout.  He reports feeling sleepy. He denies any shortness of breath or chest discomfort. He states that he is hungry. He had some previous nausea that is now resolved.   Past Medical History  Diagnosis Date  . CAD (coronary artery disease)     a. s/p PCI to mid and mid-distal RCA, 40% sten L main, no sig dz LCx  b. myoview 11/24/2014 inferoapex ischemia, medical management due to high risk for contrast nephropathy  . HTN (hypertension)   . Hyperlipidemia   . Cerebrovascular accident (HCC)   . CHF (congestive heart failure) (HCC)     EF 35% echo 2012 ; EF 44% myoview 2012; EF 76% by [facility] November 2014  . Diabetes mellitus   . 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 Unspecified hypothyroidism   . Gout   . Respiratory failure, acute (HCC) 07/21/2015    Family History  Problem Relation Age of Onset  . Kidney failure Mother   . Diabetes Mother   . Cancer Father     lung  . Heart attack Brother     massive heart attack in 64s  . Hypertension Mother     SOCIAL HISTORY: Social History   Social History  . Marital Status: Married    Spouse Name: N/A  . Number of Children: N/A  . Years of Education: N/A   Occupational History  . Not on file.   Social  History Main Topics  . Smoking status: Former Smoker -- 0.50 packs/day    Types: Cigarettes    Quit date: 10/16/1953  . Smokeless tobacco: Not on file     Comment: quit in the 70's  . Alcohol Use: No  . Drug Use: No  . Sexual Activity: Not Currently   Other Topics Concern  . Not on file   Social History Narrative   Lives in [place] with his wife and has 6 kids.     Allergies  Allergen Reactions  . Tomato Other (See Comments)    Too much acid - cannot take due to kidney issues    Current Facility-Administered Medications  Medication Dose Route Frequency Provider Last Rate Last Dose  . acetaminophen (TYLENOL) tablet 650 mg  650 mg Oral Q6H PRN Meredith Pel, NP   650 mg at 09/06/15 1316   Or  . acetaminophen (TYLENOL) suppository 650 mg  650 mg Rectal Q6H PRN Meredith Pel, NP      . allopurinol (ZYLOPRIM) tablet 100 mg  100 mg Oral Daily Beryle Lathe, MD   100 mg at 09/06/15 1039  . amoxicillin-clavulanate (AUGMENTIN) 500-125 MG per tablet 500 mg  1 tablet Oral Q24H Leatha Gilding, MD   500 mg at 09/06/15 1040  . bisacodyl (DULCOLAX) suppository 10  mg  10 mg Rectal Daily PRN Meredith Pel, NP      . calcitRIOL (ROCALTROL) capsule 0.25 mcg  0.25 mcg Oral Q48H Costin Otelia Sergeant, MD   0.25 mcg at 09/06/15 1039   And  . calcitRIOL (ROCALTROL) capsule 0.5 mcg  0.5 mcg Oral Q48H Costin Otelia Sergeant, MD   0.5 mcg at 09/05/15 1020  . calcium acetate (PHOSLO) capsule 667 mg  667 mg Oral TID WC Beryle Lathe, MD   667 mg at 09/06/15 1039  . carvedilol (COREG) tablet 6.25 mg  6.25 mg Oral BID WC Beryle Lathe, MD      . ceFAZolin (ANCEF) IVPB 2 g/50 mL premix  2 g Intravenous to XRAY Darrell K Allred, PA-C      . docusate sodium (COLACE) capsule 100 mg  100 mg Oral BID Leatha Gilding, MD   100 mg at 09/06/15 1039  . ferric gluconate (NULECIT) 125 mg in sodium chloride 0.9 % 100 mL IVPB  125 mg Intravenous Daily Beryle Lathe, MD      . insulin aspart (novoLOG)  injection 0-5 Units  0-5 Units Subcutaneous QHS Richarda Overlie, MD   2 Units at 09/05/15 2152  . insulin aspart (novoLOG) injection 0-9 Units  0-9 Units Subcutaneous TID WC Richarda Overlie, MD   2 Units at 09/06/15 0823  . isosorbide mononitrate (IMDUR) 24 hr tablet 60 mg  60 mg Oral Daily Meredith Pel, NP   60 mg at 09/06/15 1039  . levothyroxine (SYNTHROID, LEVOTHROID) tablet 175 mcg  175 mcg Oral Daily Meredith Pel, NP   175 mcg at 09/06/15 1039  . multivitamin (RENA-VIT) tablet 1 tablet  1 tablet Oral QHS Beryle Lathe, MD      . nitroGLYCERIN (NITROSTAT) SL tablet 0.4 mg  0.4 mg Sublingual Q5 min PRN Meredith Pel, NP      . olopatadine (PATANOL) 0.1 % ophthalmic solution 1 drop  1 drop Both Eyes Daily Meredith Pel, NP   1 drop at 09/06/15 1040  . ondansetron (ZOFRAN) tablet 4 mg  4 mg Oral Q6H PRN Meredith Pel, NP       Or  . ondansetron [facility]) injection 4 mg  4 mg Intravenous Q6H PRN Meredith Pel, NP      . predniSONE (DELTASONE) tablet 20 mg  20 mg Oral Q breakfast Costin Otelia Sergeant, MD   20 mg at 09/06/15 1040  . rosuvastatin (CRESTOR) tablet 10 mg  10 mg Oral Daily Richarda Overlie, MD   10 mg at 09/06/15 1039  . sodium chloride 0.9 % injection 3 mL  3 mL Intravenous Q12H Meredith Pel, NP   3 mL at 09/05/15 2058  . tamsulosin (FLOMAX) capsule 0.4 mg  0.4 mg Oral Daily Meredith Pel, NP   0.4 mg at 09/06/15 1039  . traMADol (ULTRAM) tablet 25 mg  25 mg Oral Q6H PRN Rolly Salter, MD      . Warfarin - Pharmacist Dosing Inpatient   Does not apply q1800 Newton Pigg, [facility]        REVIEW OF SYSTEMS:   denotes positive finding,  denotes negative finding Cardiac  Comments:  Chest pain or chest pressure:    Shortness of breath upon exertion:    Short of breath when lying flat:    Irregular heart rhythm: x Atrial fibrillation on coumadin      Vascular    Pain in calf, thigh, or hip brought on  by ambulation:    Pain in feet at night that wakes you up from  your sleep:     Blood clot in your veins:    Leg swelling:         Pulmonary    Oxygen at home:    Productive cough:     Wheezing:         Neurologic    Sudden weakness in arms or legs:     Sudden numbness in arms or legs:     Sudden onset of difficulty speaking or slurred speech:    Temporary loss of vision in one eye:     Problems with dizziness:         Gastrointestinal    Blood in stool:     Vomited blood:         Genitourinary    Burning when urinating:     Blood in urine:        Psychiatric    Major depression:         Hematologic    Bleeding problems:    Problems with blood clotting too easily:        Skin    Rashes or ulcers:        Constitutional    Fever or chills:      PHYSICAL EXAM: Filed Vitals:   09/06/15 1145 09/06/15 1245 09/06/15 1255 09/06/15 1315  BP: 118/68 124/68 122/64 123/70  Pulse: 63 63 63 63  Temp: 98.1 F (36.7 C) 97.4 F (36.3 C) 97.8 F (36.6 C) 97.7 F (36.5 C)  TempSrc: Oral Oral Oral Oral  Resp: Height:      Weight:      SpO2:        GENERAL: The patient is a well-nourished male, in no acute distress. He is sleepy and falls asleep intermittently during the interview. The vital signs are documented above. CARDIAC: There is a regular rate and rhythm. Systolic murmur.  VASCULAR: 1-2+ left radial pulse. Non palpable right radial pulse with dressing in place. 2+ brachial pulses bilaterally. Feet warm and edematous. IV in L arm PULMONARY: There is good air exchange bilaterally without wheezing or rales. ABDOMEN: Soft and non-tender with normal pitched bowel sounds.  MUSCULOSKELETAL: There are no major deformities or cyanosis. 2+ pedal edema.  NEUROLOGIC: No focal weakness or paresthesias are detected. SKIN: There are no ulcers or rashes noted. PSYCHIATRIC: The patient has a normal affect. LYMPH: no palpable LAD ENT: no obvious nasopharynx drainage, intact hearing, no obvious oropharynx erythema HEAD:  [place]/AT  Radiology: No results found.   Laboratory: CBC:    Component Value Date/Time   WBC 6.4 09/06/2015 0348   RBC 3.00* 09/06/2015 0348   HGB 8.9* 09/06/2015 0348   HCT 27.6* 09/06/2015 0348   PLT 144* 09/06/2015 0348   MCV 92.0 09/06/2015 0348   MCH 29.7 09/06/2015 0348   MCHC 32.2 09/06/2015 0348   RDW 14.9 09/06/2015 0348   LYMPHSABS 0.8 09/06/2015 0348   MONOABS 0.3 09/06/2015 0348   EOSABS 0.3 09/06/2015 0348   BASOSABS 0.0 09/06/2015 0348    BMP:    Component Value Date/Time   NA 134* 09/06/2015 0348   NA 141 03/01/2015   K 5.5* 09/06/2015 0348   CL 106 09/06/2015 0348   CO2 15* 09/06/2015 0348   GLUCOSE 236* 09/06/2015 0348   BUN 130* 09/06/2015 0348   BUN 75* 03/01/2015   CREATININE 6.80* 09/06/2015 0348   CREATININE 3.6*  03/01/2015   CREATININE 3.42* 12/14/2014 1104   CALCIUM 8.7* 09/06/2015 0348   GFRNONAA 7* 09/06/2015 0348   GFRNONAA 16* 12/14/2014 1104   GFRAA 8* 09/06/2015 0348   GFRAA 19* 12/14/2014 1104    Coagulation: Lab Results  Component Value Date   INR 2.53* 09/06/2015   INR 2.80* 09/05/2015   INR 2.53* 09/04/2015   No results found for: PTT  Lipids:    Component Value Date/Time   CHOL 123 01/19/2015 0935   TRIG 80.0 01/19/2015 0935   HDL 47.40 01/19/2015 0935   CHOLHDL 3 01/19/2015 0935   VLDL 16.0 01/19/2015 0935   LDLCALC 60 01/19/2015 0935   LDLDIRECT 50.3 08/15/2010 0849      MEDICAL ISSUES:  New ESRD -The patient will have a tunneled dialysis catheter today following correction of INR. He will have HD afterwards.  -Will order vein mapping. -The patient is right handed. Plan for left arm access.  -The earliest available date for surgery is Wednesday 09/08/15.  - discussed the nature of access surgery, benefits, risks and alternatives. The patient is aware that the risks of access surgery include but are not limited to: bleeding, infection, steal syndrome, nerve damage, failure of access to mature, and possible  need for additional access procedures in the future. -The patient and family are willing to proceed.   Atrial fibrillation on coumadin -INR will need to be less than ~1.6 prior to surgery.   Raymond GurneyKimberly A Trinh, PA-C [facility] [phone]  Addendum  I have independently interviewed and examined the patient, and I agree with the physician assistant's findings.  Awaiting vein mapping to determine permanent access options.  [facility] access also if desired.  Leonides SakeBrian Ron Beske, MD [facility] [place] Office: [phone] Pager: [phone]  09/06/2015, 2:39 PM

## 2015-09-06 NOTE — Progress Notes (Signed)
Inpatient Diabetes Program Recommendations  AACE/ADA: New Consensus Statement on Inpatient Glycemic Control (2015)  Target Ranges:  Prepandial:   less than 140 mg/dL      Peak postprandial:   less than 180 mg/dL (1-2 hours)      Critically ill patients:  140 - 180 mg/dL   Results for [name] [name], [name] [name] (MRN [id number]) as of 09/06/2015 08:21  Ref. Range 09/05/2015 05:43 09/05/2015 11:36 09/05/2015 16:05 09/05/2015 20:35 09/06/2015 06:21  Glucose-Capillary Latest Ref Range: 65-99 mg/dL 409166 (H) 811168 (H) 914212 (H) 244 (H) 189 (H)   Review of Glycemic Control  Diabetes history: DM 2 Outpatient Diabetes medications: Glipizide 2.5 mg QAm, 5 mg QPM Current orders for Inpatient glycemic control: Novolog Sensitive + HS scale  Inpatient Diabetes Program Recommendations: Correction (SSI): Glucose increasing into the 200's later in the day due to PO prednisone dose. Please consider increasing Novolog to Moderate correction TID.   Thanks,  Christena DeemShannon Shainna Faux RN, MSN, [facility] Inpatient Diabetes Coordinator Team Pager [phone] (8a-5p)

## 2015-09-06 NOTE — Consult Note (Signed)
Chief Complaint: Patient was seen in consultation today for tunneled hemodialysis catheter placement Chief Complaint  Patient presents with  . Weakness    Referring Physician(s): Schertz,R  History of Present Illness: [name] [name] is a 79 y.o. male with past medical history significant for coronary artery disease, atrial fibrillation, hypertension, hyperlipidemia, prior CVA, CHF, diabetes, gout and chronic kidney disease recent admitted with generalized weakness, bilateral leg pain and lethargy. Patient now with increasing creatinine and potassium levels. Request received from nephrology for tunneled hemodialysis catheter placement.  Past Medical History  Diagnosis Date  . CAD (coronary artery disease)     a. s/p PCI to mid and mid-distal RCA, 40% sten L main, no sig dz LCx  b. myoview 11/24/2014 inferoapex ischemia, medical management due to high risk for contrast nephropathy  . HTN (hypertension)   . Hyperlipidemia   . Cerebrovascular accident (HCC)   . CHF (congestive heart failure) (HCC)     EF 35% echo 2012 ; EF 44% myoview 2012; EF 76% by [facility] November 2014  . Diabetes mellitus   . 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 Unspecified hypothyroidism   . Gout   . Respiratory failure, acute (HCC) 07/21/2015    Past Surgical History  Procedure Laterality Date  . Thyroidectomy      Allergies: Tomato  Medications: Prior to Admission medications   Medication Sig Start Date End Date Taking? Authorizing Provider  acetaminophen (TYLENOL) 500 MG tablet Take 500 mg by mouth 2 (two) times daily as needed (pain).    Yes Historical Provider, MD  amLODipine (NORVASC) 10 MG tablet Take 10 mg by mouth daily. 04/30/15  Yes Historical Provider, MD  amoxicillin-clavulanate (AUGMENTIN) 500-125 MG tablet Take 1 tablet (500 mg total) by mouth every 12 (twelve) hours. 09/01/15  Yes Richarda Overlie, MD  calcitRIOL (ROCALTROL) 0.25 MCG capsule Take 0.25-0.5 mcg by mouth daily.  Take 1 tablet ( 0.3mcg) on odd days and  2 tablets (0.62mcg) on even days as directed   Yes Historical Provider, MD  carvedilol (COREG) 12.5 MG tablet Take 1 tablet (12.5 mg total) by mouth 2 (two) times daily with a meal. 01/11/15  Yes [name] Patty, MD  [facility] Liver Oil OIL Take 15 mLs by mouth daily.   Yes Historical Provider, MD  colchicine 0.6 MG tablet Take 0.5 tablets (0.3 mg total) by mouth daily. Patient taking differently: Take 0.3 mg by mouth daily as needed (gout).  08/19/15  Yes [name], MD  furosemide (LASIX) 40 MG tablet Take 1.5 tablets (60 mg total) by mouth 2 (two) times daily. 09/01/15  Yes Richarda Overlie, MD  glipiZIDE (GLUCOTROL) 5 MG tablet Take 2.5-5 mg by mouth 2 (two) times daily before a meal. Take 1/2 tablet (2.5 mg) daily before breakfast and 1 tablet (5 mg) before supper 05/01/15  Yes Historical Provider, MD  hydrALAZINE (APRESOLINE) 50 MG tablet Take 1 tablet (50 mg total) by mouth 3 (three) times daily. 09/01/15  Yes Richarda Overlie, MD  isosorbide mononitrate (IMDUR) 60 MG 24 hr tablet Take 1 tablet (60 mg total) by mouth daily. 02/15/15  Yes [name], MD  levothyroxine (SYNTHROID, LEVOTHROID) 175 MCG tablet Take 1 tablet (175 mcg total) by mouth daily. 08/13/15  Yes [name], MD  Magnesium 250 MG TABS Take 250 mg by mouth daily.   Yes Historical Provider, MD  nitroGLYCERIN (NITROSTAT) 0.4 MG SL tablet Place 1 tablet (0.4 mg  total) under the tongue every 5 (five) minutes as needed for chest pain. 11/25/14 04/25/17 Yes Azalee Course, PA  olopatadine (PATANOL) 0.1 % ophthalmic solution Place 1 drop into both eyes 2 (two) times daily. Patient taking differently: Place 1 drop into both eyes daily.  04/20/15  Yes [name], MD  rosuvastatin (CRESTOR) 20 MG tablet Take 1 tablet (20 mg total) by mouth daily. 03/12/15  Yes [name], MD  tamsulosin (FLOMAX) 0.4 MG CAPS capsule Take 1 capsule (0.4 mg total) by mouth daily. 09/01/15  Yes Richarda Overlie, MD    warfarin (COUMADIN) 5 MG tablet Take 1 tablet (5 mg total) by mouth daily. Patient taking differently: Take 2.5-5 mg by mouth daily. Take 1 tablet (5 mg) by mouth Sunday, Tuesday, Thursday, take 1/2 tablet (2.5 mg) on Monday, Wednesday, Friday and Saturday 07/23/15  Yes Calvert Cantor, MD     Family History  Problem Relation Age of Onset  . Kidney failure Mother   . Diabetes Mother   . Cancer Father     lung  . Heart attack Brother     massive heart attack in 42s  . Hypertension Mother     Social History   Social History  . Marital Status: Married    Spouse Name: N/A  . Number of Children: N/A  . Years of Education: N/A   Social History Main Topics  . Smoking status: Former Smoker -- 0.50 packs/day    Types: Cigarettes    Quit date: 10/16/1953  . Smokeless tobacco: None     Comment: quit in the 70's  . Alcohol Use: No  . Drug Use: No  . Sexual Activity: Not Currently   Other Topics Concern  . None   Social History Narrative   Lives in [place] with his wife and has 6 kids.       Review of Systems  Constitutional: Positive for fatigue. Negative for fever and chills.  Respiratory: Positive for shortness of breath.        Occasional cough  Cardiovascular: Positive for leg swelling. Negative for chest pain.  Gastrointestinal: Negative for nausea, vomiting, abdominal pain and blood in stool.  Genitourinary: Negative for dysuria and hematuria.  Musculoskeletal: Negative for back pain.  Neurological: Positive for weakness. Negative for headaches.    Vital Signs: BP 131/64 mmHg  Pulse 64  Temp(Src) 97.6 F (36.4 C) (Oral)  Resp 18  Ht 5\' 9"  (1.753 m)  Wt 198 lb 6.6 oz (90 kg)  BMI 29.29 kg/m2  SpO2 100%  Physical Exam  Constitutional: He is oriented to person, place, and time. He appears well-developed and well-nourished.  Cardiovascular: Normal rate and regular rhythm.   Murmur heard. Pulmonary/Chest: Effort normal.   Bibasilar crackles  Abdominal:  Soft. Bowel sounds are normal. There is no tenderness.  Musculoskeletal: Normal range of motion. He exhibits edema.  Neurological: He is alert and oriented to person, place, and time.    Mallampati Score:     Imaging: Dg Chest 2 View  09/03/2015  CLINICAL DATA:  Pt was recently hospitalized for PNA, was released Wed. and has since felt "tired/fatiqued", stated no chest complaints EXAM: CHEST  2 VIEW COMPARISON:  08/29/2015 FINDINGS: Mild decrease in the airspace opacity noted in the right middle lobe on the prior study, with no new areas of lung consolidation. Lungs are mildly hyperexpanded. No convincing pulmonary edema. No pleural effusion or pneumothorax. Heart is mildly enlarged. No mediastinal or hilar masses or convincing adenopathy. Bony  thorax is demineralized but intact. IMPRESSION: Mild improvement in the right middle lobe pneumonia since the prior study. No other change. No new abnormality. Electronically Signed   By: Amie Portlandavid  Ormond M.D.   On: 09/03/2015 14:18   Dg Chest 2 View  08/29/2015  CLINICAL DATA:  Acute shortness of breath, history CHF EXAM: CHEST  2 VIEW COMPARISON:  07/22/2015 FINDINGS: Dense consolidative opacity in the right middle lobe obscures the right cardiac border compatible with pneumonia or partial collapse. Heart is enlarged. Chronic vascular congestion noted without definite CHF. No effusion or pneumothorax. Trachea midline. Atherosclerosis noted of the aorta. IMPRESSION: Right middle lobe dense consolidative opacity compatible with partial collapse versus consolidative pneumonia. Cardiomegaly with chronic vascular congestion Recommend radiographic follow-up after medical therapy to document resolution. Electronically Signed   By: Judie PetitM.  Shick M.D.   On: 08/29/2015 12:08   Dg Chest Port 1 View  09/04/2015  CLINICAL DATA:  Patient with weakness.  On Coumadin. EXAM: PORTABLE CHEST 1 VIEW COMPARISON:  Chest radiograph 09/03/2015 FINDINGS: Monitoring leads overlie the  patient. Stable marked cardiomegaly. No significant interval change bilateral mid and lower lung heterogeneous opacities. No large pleural effusion or pneumothorax. IMPRESSION: Stable cardiomegaly and heterogeneous airspace opacities within the bilateral mid and lower lungs, right-greater-than-left. Electronically Signed   By: [name]  [name]   On: 09/04/2015 13:08    Labs:  CBC:  Recent Labs  09/03/15 1356 09/03/15 1406 09/04/15 0345 09/05/15 0156 09/06/15 0348  WBC 5.9  --  4.0 4.6 6.4  HGB 9.2* 10.5* 8.6* 8.5* 8.9*  HCT 28.7* 31.0* 26.8* 26.3* 27.6*  PLT 118*  --  121* 123* 144*    COAGS:  Recent Labs  07/19/15 1943  09/03/15 1533 09/04/15 0345 09/05/15 0156 09/06/15 0348  INR 1.36  < > 2.14* 2.53* 2.80* 2.53*  APTT 54*  --   --   --   --   --   < > = values in this interval not displayed.  BMP:  Recent Labs  09/01/15 0244 09/03/15 1406 09/04/15 0345 09/05/15 0156 09/06/15 0348  NA 137 138 137 137 134*  K 5.2* 4.8 5.2* 5.2* 5.5*  CL 109 108 108 108 106  CO2 17*  --  16* 16* 15*  GLUCOSE 110* 86 182* 202* 236*  BUN 79* 97* 107* 119* 130*  CALCIUM 8.4*  --  8.6* 8.5* 8.7*  CREATININE 4.79* 5.90* 6.22* 6.60* 6.80*  GFRNONAA 11*  --  8* 7* 7*  GFRAA 12*  --  9* 8* 8*    LIVER FUNCTION TESTS:  Recent Labs  08/31/15 0350 09/04/15 0345 09/05/15 0156 09/06/15 0348  BILITOT 0.5 0.4 0.6 0.5  AST 20 57* 55* 50*  ALT 17 34 34 39  ALKPHOS 42 57 57 55  PROT 5.5* 5.6* 6.1* 6.2*  ALBUMIN 2.8* 2.5* 2.4* 2.6*    TUMOR MARKERS: No results for input(s): AFPTM, CEA, CA199, CHROMGRNA in the last 8760 hours.  Assessment and Plan: 79 y.o. male with past medical history significant for coronary artery disease, atrial fibrillation, hypertension, hyperlipidemia, prior CVA, CHF, diabetes, gout and chronic kidney disease recent admitted with generalized weakness, bilateral leg pain and lethargy. Patient now with increasing creatinine and potassium levels. Request  received from nephrology for tunneled hemodialysis catheter placement. Patient previously on Coumadin for atrial fibrillation, PT/INR today is 26.9/2.53. Patient to receive FFP and vitamin K and if INR at safe level will plan to proceed with tunneled catheter placement today. Otherwise may need  to consider temp catheter placement. Details/risks of procedure, including but not limited to, internal bleeding, infection, injury to adjacent structures, discussed with patient and son with their understanding and consent.   Thank you for this interesting consult.  I greatly enjoyed meeting TYRAIL GRANDFIELD and look forward to participating in their care.  A copy of this report was sent to the requesting provider on this date.  Signed: D. Jeananne Rama 09/06/2015, 9:52 AM   I spent a total of 20 minutes in face to face in clinical consultation, greater than 50% of which was counseling/coordinating care for hemodialysis catheter placement

## 2015-09-06 NOTE — Progress Notes (Signed)
OT Cancellation Note  Patient Details Name: Ernestine ConradRoy L Lottman MRN: [id number] DOB: 08/12/1936   Cancelled Treatment:    Reason Eval/Treat Not Completed: Medical issues which prohibited therapy. Nursing just starting 1 bag of plasma.  Evette GeorgesLeonard, Kendyn Zaman Eva [phone] 09/06/2015, 1:07 PM

## 2015-09-07 ENCOUNTER — Ambulatory Visit: Payer: Medicare Other | Admitting: Family Medicine

## 2015-09-07 ENCOUNTER — Inpatient Hospital Stay (HOSPITAL_COMMUNITY): Payer: Medicare Other

## 2015-09-07 LAB — CBC
HCT: 27.4 % — ABNORMAL LOW (ref 39.0–52.0)
HCT: 27.2 % — ABNORMAL LOW (ref 39.0–52.0)
Hemoglobin: 8.5 g/dL — ABNORMAL LOW (ref 13.0–17.0)
Hemoglobin: 8.8 g/dL — ABNORMAL LOW (ref 13.0–17.0)
MCH: 28.8 pg (ref 26.0–34.0)
MCH: 29.5 pg (ref 26.0–34.0)
MCHC: 31 g/dL (ref 30.0–36.0)
MCHC: 32.4 g/dL (ref 30.0–36.0)
MCV: 92.9 fL (ref 78.0–100.0)
MCV: 91.3 fL (ref 78.0–100.0)
PLATELETS: 148 10*3/uL — AB (ref 150–400)
PLATELETS: 68 10*3/uL — AB (ref 150–400)
RBC: 2.95 MIL/uL — ABNORMAL LOW (ref 4.22–5.81)
RBC: 2.98 MIL/uL — ABNORMAL LOW (ref 4.22–5.81)
RDW: 14.7 % (ref 11.5–15.5)
RDW: 14.6 % (ref 11.5–15.5)
WBC: 4.9 10*3/uL (ref 4.0–10.5)
WBC: 5.7 10*3/uL (ref 4.0–10.5)

## 2015-09-07 LAB — BASIC METABOLIC PANEL
Anion gap: 12 (ref 5–15)
BUN: 141 mg/dL — AB (ref 6–20)
CO2: 17 mmol/L — ABNORMAL LOW (ref 22–32)
CREATININE: 7 mg/dL — AB (ref 0.61–1.24)
Calcium: 8.8 mg/dL — ABNORMAL LOW (ref 8.9–10.3)
Chloride: 107 mmol/L (ref 101–111)
GFR calc Af Amer: 8 mL/min — ABNORMAL LOW (ref >60)
GFR, EST NON AFRICAN AMERICAN: 7 mL/min — AB (ref >60)
Glucose, Bld: 243 mg/dL — ABNORMAL HIGH (ref 65–99)
Potassium: 5.5 mmol/L — ABNORMAL HIGH (ref 3.5–5.1)
SODIUM: 136 mmol/L (ref 135–145)

## 2015-09-07 LAB — PREPARE FRESH FROZEN PLASMA
UNIT DIVISION: 0
Unit division: 0

## 2015-09-07 LAB — GLUCOSE, CAPILLARY
GLUCOSE-CAPILLARY: 279 mg/dL — AB (ref 65–99)
Glucose-Capillary: 194 mg/dL — ABNORMAL HIGH (ref 65–99)
Glucose-Capillary: 182 mg/dL — ABNORMAL HIGH (ref 65–99)
Glucose-Capillary: 244 mg/dL — ABNORMAL HIGH (ref 65–99)

## 2015-09-07 LAB — RENAL FUNCTION PANEL
ALBUMIN: 2.7 g/dL — AB (ref 3.5–5.0)
Anion gap: 12 (ref 5–15)
BUN: 95 mg/dL — AB (ref 6–20)
CO2: 21 mmol/L — ABNORMAL LOW (ref 22–32)
CREATININE: 4.93 mg/dL — AB (ref 0.61–1.24)
Calcium: 8.4 mg/dL — ABNORMAL LOW (ref 8.9–10.3)
Chloride: 104 mmol/L (ref 101–111)
GFR calc Af Amer: 12 mL/min — ABNORMAL LOW (ref >60)
GFR, EST NON AFRICAN AMERICAN: 10 mL/min — AB (ref >60)
GLUCOSE: 300 mg/dL — AB (ref 65–99)
PHOSPHORUS: 5.5 mg/dL — AB (ref 2.5–4.6)
Potassium: 4.4 mmol/L (ref 3.5–5.1)
SODIUM: 137 mmol/L (ref 135–145)

## 2015-09-07 LAB — PARATHYROID HORMONE, INTACT (NO CA): PTH: 154 pg/mL — ABNORMAL HIGH (ref 15–65)

## 2015-09-07 LAB — PROTIME-INR
INR: 1.73 — ABNORMAL HIGH (ref 0.00–1.49)
INR: 1.59 — AB (ref 0.00–1.49)
PROTHROMBIN TIME: 20.2 s — AB (ref 11.6–15.2)
Prothrombin Time: 19 seconds — ABNORMAL HIGH (ref 11.6–15.2)

## 2015-09-07 LAB — HEPATITIS B SURFACE ANTIBODY,QUALITATIVE: Hep B S Ab: REACTIVE

## 2015-09-07 LAB — HEPATITIS C ANTIBODY (REFLEX)

## 2015-09-07 LAB — HEPATITIS B CORE ANTIBODY, IGM: HEP B C IGM: NEGATIVE

## 2015-09-07 LAB — MAGNESIUM: MAGNESIUM: 2.2 mg/dL (ref 1.7–2.4)

## 2015-09-07 LAB — HEPATITIS B SURFACE ANTIGEN: Hepatitis B Surface Ag: NEGATIVE

## 2015-09-07 LAB — HCV COMMENT:

## 2015-09-07 MED ORDER — SODIUM CHLORIDE 0.9 % IV SOLN: 100.0000 mL | INTRAVENOUS | Status: DC | PRN

## 2015-09-07 MED ORDER — HEPARIN SODIUM (PORCINE) 1000 UNIT/ML IJ SOLN
INTRAMUSCULAR | Status: AC
  Filled 2015-09-07: qty 1

## 2015-09-07 MED ORDER — DARBEPOETIN ALFA 200 MCG/0.4ML IJ SOSY: 200.0000 ug | PREFILLED_SYRINGE | INTRAMUSCULAR | Status: DC

## 2015-09-07 MED ORDER — CALCIUM ACETATE (PHOS BINDER) 667 MG PO CAPS
667.0000 mg | ORAL_CAPSULE | Freq: Three times a day (TID) | ORAL | Status: DC
  Administered 2015-09-07 – 2015-09-13 (×16): 667 mg via ORAL
  Filled 2015-09-07 (×17): qty 1

## 2015-09-07 MED ORDER — DARBEPOETIN ALFA 200 MCG/0.4ML IJ SOSY
PREFILLED_SYRINGE | INTRAMUSCULAR | Status: AC
  Administered 2015-09-07: 200 ug
  Filled 2015-09-07: qty 0.4

## 2015-09-07 MED ORDER — HEPARIN SODIUM (PORCINE) 1000 UNIT/ML DIALYSIS
1000.0000 [IU] | INTRAMUSCULAR | Status: DC | PRN
  Filled 2015-09-07: qty 1

## 2015-09-07 MED ORDER — GELATIN ABSORBABLE 12-7 MM EX MISC
CUTANEOUS | Status: AC
  Filled 2015-09-07: qty 1

## 2015-09-07 MED ORDER — LIDOCAINE HCL (PF) 1 % IJ SOLN: 5.0000 mL | INTRAMUSCULAR | Status: DC | PRN

## 2015-09-07 MED ORDER — MIDAZOLAM HCL 2 MG/2ML IJ SOLN
INTRAMUSCULAR | Status: AC
  Filled 2015-09-07: qty 2

## 2015-09-07 MED ORDER — ALTEPLASE 2 MG IJ SOLR
2.0000 mg | Freq: Once | INTRAMUSCULAR | Status: DC | PRN
  Filled 2015-09-07: qty 2

## 2015-09-07 MED ORDER — ISOSORBIDE MONONITRATE ER 60 MG PO TB24
60.0000 mg | ORAL_TABLET | Freq: Every day | ORAL | Status: DC
  Administered 2015-09-07 – 2015-09-12 (×6): 60 mg via ORAL
  Filled 2015-09-07 (×6): qty 1

## 2015-09-07 MED ORDER — HEPARIN SODIUM (PORCINE) 1000 UNIT/ML DIALYSIS
40.0000 [IU]/kg | Freq: Once | INTRAMUSCULAR | Status: DC
  Filled 2015-09-07: qty 4

## 2015-09-07 MED ORDER — LIDOCAINE-PRILOCAINE 2.5-2.5 % EX CREA
1.0000 "application " | TOPICAL_CREAM | CUTANEOUS | Status: DC | PRN
  Filled 2015-09-07: qty 5

## 2015-09-07 MED ORDER — CARVEDILOL 3.125 MG PO TABS
3.1250 mg | ORAL_TABLET | Freq: Two times a day (BID) | ORAL | Status: DC
  Administered 2015-09-07: 3.125 mg via ORAL
  Filled 2015-09-07: qty 1

## 2015-09-07 MED ORDER — CEFAZOLIN SODIUM-DEXTROSE 2-3 GM-% IV SOLR
INTRAVENOUS | Status: AC
  Administered 2015-09-07: 2000 mg via INTRAVENOUS
  Filled 2015-09-07: qty 50

## 2015-09-07 MED ORDER — LIDOCAINE HCL 1 % IJ SOLN
INTRAMUSCULAR | Status: AC
  Filled 2015-09-07: qty 20

## 2015-09-07 MED ORDER — FENTANYL CITRATE (PF) 100 MCG/2ML IJ SOLN
INTRAMUSCULAR | Status: AC | PRN
  Administered 2015-09-07: 50 ug via INTRAVENOUS

## 2015-09-07 MED ORDER — PENTAFLUOROPROP-TETRAFLUOROETH EX AERO: 1.0000 "application " | INHALATION_SPRAY | CUTANEOUS | Status: DC | PRN

## 2015-09-07 MED ORDER — MIDAZOLAM HCL 2 MG/2ML IJ SOLN
INTRAMUSCULAR | Status: AC | PRN
  Administered 2015-09-07: 1 mg via INTRAVENOUS

## 2015-09-07 MED ORDER — FENTANYL CITRATE (PF) 100 MCG/2ML IJ SOLN
INTRAMUSCULAR | Status: AC
  Filled 2015-09-07: qty 2

## 2015-09-07 NOTE — Progress Notes (Signed)
Subjective: Interval History: has no complaint, got catheter.  Objective: Vital signs in last 24 hours: Temp:  [97.4 F (36.3 C)-98.6 F (37 C)] 98.6 F (37 C) (11/22 1130) Pulse Rate:  [56-84] 84 (11/22 1130) Resp:  [12-24] 18 (11/22 1130) BP: (121-139)/(53-78) 135/68 mmHg (11/22 1130) SpO2:  [91 %-100 %] 92 % (11/22 1130) Weight:  [89.2 kg (196 lb 10.4 oz)] 89.2 kg (196 lb 10.4 oz) (11/22 0437) Weight change: -0.8 kg (-1 lb 12.2 oz)  Intake/Output from previous day: 11/21 0701 - 11/22 0700 In: 1262.5 [P.O.:420; I.V.:7.5; Blood:515; IV Piggyback:320] Out: 300 [Urine:300] Intake/Output this shift: Total I/O In: 0  Out: 150 [Urine:150]  General appearance: cooperative, moderately obese, pale and slowed mentation Resp: diminished breath sounds bilaterally and rales bibasilar Cardio: S1, S2 normal and systolic murmur: holosystolic 2/6, blowing at apex GI: obese, pos bs, soft, liver down 5 cm Extremities: edema 2 +  Lab Results:  Recent Labs  09/06/15 0348 09/07/15 0322  WBC 6.4 4.9  HGB 8.9* 8.5*  HCT 27.6* 27.4*  PLT 144* 148*   BMET:  Recent Labs  09/06/15 0348 09/07/15 0322  NA 134* 136  K 5.5* 5.5*  CL 106 107  CO2 15* 17*  GLUCOSE 236* 243*  BUN 130* 141*  CREATININE 6.80* 7.00*  CALCIUM 8.7* 8.8*    Recent Labs  09/06/15 1005  PTH 154*   Iron Studies:  Recent Labs  09/06/15 0348  IRON 16*  TIBC 266    Studies/Results: Ir Fluoro Guide Cv Line Right  09/07/2015  INDICATION: 79 year old male with a end-stage renal disease now requiring hemodialysis. EXAM: TUNNELED CENTRAL VENOUS HEMODIALYSIS CATHETER PLACEMENT WITH ULTRASOUND AND FLUOROSCOPIC GUIDANCE MEDICATIONS: Ancef 2 gm IV; The IV antibiotic was given in an appropriate time interval prior to skin puncture. CONTRAST:  None ANESTHESIA/SEDATION: Versed 1 mg IV; Fentanyl 50 mcg IV Total Moderate Sedation Time Twenty minutes. FLUOROSCOPY TIME:  54 seconds for a total of 77.9 mGy COMPLICATIONS:  None immediate PROCEDURE: Informed written consent was obtained from the patient after a discussion of the risks, benefits, and alternatives to treatment. Questions regarding the procedure were encouraged and answered. The right neck and chest were prepped with chlorhexidine in a sterile fashion, and a sterile drape was applied covering the operative field. Maximum barrier sterile technique with sterile gowns and gloves were used for the procedure. A timeout was performed prior to the initiation of the procedure. After creating a small venotomy incision, a micropuncture kit was utilized to access the right internal jugular vein under direct, real-time ultrasound guidance after the overlying soft tissues were anesthetized with 1% lidocaine with epinephrine. Ultrasound image documentation was performed. The microwire was kinked to measure appropriate catheter length. A stiff glidewire was advanced to the level of the IVC and the micropuncture sheath was exchanged for a peel-away sheath. A Palindrome tunneled hemodialysis catheter measuring 28 cm from tip to cuff was tunneled in a retrograde fashion from the anterior chest wall to the venotomy incision. The catheter was then placed through the peel-away sheath with tips ultimately positioned within the mid right atrium. Final catheter positioning was confirmed and documented with a spot radiographic image. The catheter aspirates and flushes normally. The catheter was flushed with appropriate volume heparin dwells. The catheter exit site was secured with a 0-Prolene retention suture. The venotomy incision was closed with an interrupted 4-0 Vicryl, and the epidermis sealed with Dermabond. Dressings were applied. The patient tolerated the procedure well without immediate post procedural  complication. IMPRESSION: Successful placement of 28 cm tip to cuff tunneled hemodialysis catheter via the right internal jugular vein with tips terminating within the mid right atrium.  The catheter is ready for immediate use. Signed, Criselda Peaches, MD [facility] Radiology Electronically Signed   By: Jacqulynn Cadet M.D.   On: 09/07/2015 10:29   Ir US Guide Vasc Access Right  09/07/2015  INDICATION: 79 year old male with a end-stage renal disease now requiring hemodialysis. EXAM: TUNNELED CENTRAL VENOUS HEMODIALYSIS CATHETER PLACEMENT WITH ULTRASOUND AND FLUOROSCOPIC GUIDANCE MEDICATIONS: Ancef 2 gm IV; The IV antibiotic was given in an appropriate time interval prior to skin puncture. CONTRAST:  None ANESTHESIA/SEDATION: Versed 1 mg IV; Fentanyl 50 mcg IV Total Moderate Sedation Time Twenty minutes. FLUOROSCOPY TIME:  54 seconds for a total of 89.1 mGy COMPLICATIONS: None immediate PROCEDURE: Informed written consent was obtained from the patient after a discussion of the risks, benefits, and alternatives to treatment. Questions regarding the procedure were encouraged and answered. The right neck and chest were prepped with chlorhexidine in a sterile fashion, and a sterile drape was applied covering the operative field. Maximum barrier sterile technique with sterile gowns and gloves were used for the procedure. A timeout was performed prior to the initiation of the procedure. After creating a small venotomy incision, a micropuncture kit was utilized to access the right internal jugular vein under direct, real-time ultrasound guidance after the overlying soft tissues were anesthetized with 1% lidocaine with epinephrine. Ultrasound image documentation was performed. The microwire was kinked to measure appropriate catheter length. A stiff glidewire was advanced to the level of the IVC and the micropuncture sheath was exchanged for a peel-away sheath. A Palindrome tunneled hemodialysis catheter measuring 28 cm from tip to cuff was tunneled in a retrograde fashion from the anterior chest wall to the venotomy incision. The catheter was then  placed through the peel-away sheath with tips ultimately positioned within the mid right atrium. Final catheter positioning was confirmed and documented with a spot radiographic image. The catheter aspirates and flushes normally. The catheter was flushed with appropriate volume heparin dwells. The catheter exit site was secured with a 0-Prolene retention suture. The venotomy incision was closed with an interrupted 4-0 Vicryl, and the epidermis sealed with Dermabond. Dressings were applied. The patient tolerated the procedure well without immediate post procedural complication. IMPRESSION: Successful placement of 28 cm tip to cuff tunneled hemodialysis catheter via the right internal jugular vein with tips terminating within the mid right atrium. The catheter is ready for immediate use. Signed, Criselda Peaches, MD [facility] Radiology Electronically Signed   By: Jacqulynn Cadet M.D.   On: 09/07/2015 10:29    I have reviewed the patient's current medications.  Assessment/Plan: 1 ESRD for 1st HD  Vol and solute issues.  Cath in, perm access on Fri 2 Anemia on esa,Fe 3 HTN lower vol 4 HPTH vit D 5 DM controlled 6 CM 7 CAD 8 Hx CVA 9 Gout P HD, do 2 d in a row, lower meds ,     LOS: 3 days   [name] [name] 09/07/2015,12:14 PM

## 2015-09-07 NOTE — NC FL2 (Signed)
[place] MEDICAID FL2 LEVEL OF CARE SCREENING TOOL     IDENTIFICATION  Patient Name: [name] [name] Birthdate: Jul 17, 1936 Sex: male Admission Date (Current Location): 09/03/2015  [facility] and [place] Number: Producer, television/film/video and Address:  [facility], [address] [place], [place] [address]      Provider Number: [id number]  Attending Physician Name and Address:  Rolly Salter, MD  Relative Name and Phone Number:   castor gittleman [phone]))    Current Level of Care: Hospital Recommended Level of Care: Skilled Nursing Facility Prior Approval Number:    Date Approved/Denied:   PASRR Number:  [id number]  Discharge Plan: SNF    Current Diagnoses: Patient Active Problem List   Diagnosis Date Noted  . Hypertension 09/03/2015  . Acute on chronic kidney failure (HCC) 09/03/2015  . Weakness 09/03/2015  . 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 HCAP (healthcare-associated pneumonia) 08/29/2015  . DOE (dyspnea on exertion) 08/29/2015  . Thrombocytopenia (HCC) 08/29/2015  . Long term (current) use of anticoagulants 08/03/2015  . Atrial fibrillation (HCC) 07/30/2015  . CAP (community acquired pneumonia) 07/23/2015  . Bladder outflow obstruction   . Acute combined systolic and diastolic congestive heart failure (HCC) 07/21/2015  . Ischemic cardiomyopathy 04/13/2011  . Osteoarthritis severe bilateral knees 04/06/2010  . LEG CRAMPS, IDIOPATHIC 04/06/2010  . Congestive heart failure (HCC) 09/07/2008  . BACK PAIN 03/20/2008  . Type II diabetes mellitus with renal manifestations (HCC) 10/31/2007  . Chronic renal disease, stage IV (HCC) 10/31/2007  . Coronary atherosclerosis 09/25/2007  . Hypothyroidism 04/09/2007  . Hyperlipidemia 04/09/2007  . GOUT 04/09/2007  . Essential hypertension 04/09/2007  . CEREBROVASCULAR ACCIDENT, HX OF 04/09/2007    Orientation ACTIVITIES/SOCIAL BLADDER RESPIRATION    Self, Time, Situation, Place  Passive Continent Normal  BEHAVIORAL SYMPTOMS/MOOD NEUROLOGICAL BOWEL NUTRITION STATUS      Continent Diet (NPO)  PHYSICIAN VISITS COMMUNICATION OF NEEDS Height & Weight Skin  30 days Verbally  (175.3 cm) 196 lbs. Normal          AMBULATORY STATUS RESPIRATION    Supervision limited Normal      Personal Care Assistance Level of Assistance  Bathing, Feeding, Dressing Bathing Assistance: Independent Feeding assistance: Limited assistance Dressing Assistance: Independent      Functional Limitations Info  Sight, Hearing, Speech Sight Info: Adequate Hearing Info: Adequate Speech Info: Adequate       SPECIAL CARE FACTORS FREQUENCY  PT (By licensed PT)     PT Frequency: 5x             Additional Factors Info  Code Status, Allergies Code Status Info: full Allergies Info: tomato           Current Medications (09/07/2015): Current Facility-Administered Medications  Medication Dose Route Frequency Provider Last Rate Last Dose  . acetaminophen (TYLENOL) tablet 650 mg  650 mg Oral Q6H PRN Meredith Pel, NP   650 mg at 09/06/15 1316   Or  . acetaminophen (TYLENOL) suppository 650 mg  650 mg Rectal Q6H PRN Meredith Pel, NP      . allopurinol (ZYLOPRIM) tablet 100 mg  100 mg Oral Daily Beryle Lathe, MD   100 mg at 09/06/15 1039  . amoxicillin-clavulanate (AUGMENTIN) 500-125 MG per tablet 500 mg  1 tablet Oral Q24H Leatha Gilding, MD   500 mg at 09/06/15 1040  . bisacodyl (DULCOLAX) suppository 10 mg  10 mg Rectal Daily PRN Meredith Pel, NP      .  calcitRIOL (ROCALTROL) capsule 0.25 mcg  0.25 mcg Oral Q48H Costin Otelia Sergeant, MD   0.25 mcg at 09/06/15 1039   And  . calcitRIOL (ROCALTROL) capsule 0.5 mcg  0.5 mcg Oral Q48H Costin Otelia Sergeant, MD   0.5 mcg at 09/05/15 1020  . calcium acetate (PHOSLO) capsule 667 mg  667 mg Oral TID WC Rolly Salter, MD   Stopped at 09/07/15 0827  . carvedilol (COREG) tablet 6.25 mg  6.25 mg Oral BID WC Beryle Lathe, MD    6.25 mg at 09/07/15 0831  . ceFAZolin (ANCEF) IVPB 2 g/50 mL premix  2 g Intravenous to XRAY Bertram Millard, RPH      . docusate sodium (COLACE) capsule 100 mg  100 mg Oral BID Leatha Gilding, MD   100 mg at 09/06/15 1039  . ferric gluconate (NULECIT) 125 mg in sodium chloride 0.9 % 100 mL IVPB  125 mg Intravenous Daily Beryle Lathe, MD   125 mg at 09/06/15 2000  . insulin aspart (novoLOG) injection 0-5 Units  0-5 Units Subcutaneous QHS Richarda Overlie, MD   2 Units at 09/06/15 2154  . insulin aspart (novoLOG) injection 0-9 Units  0-9 Units Subcutaneous TID WC Richarda Overlie, MD   2 Units at 09/07/15 1610  . isosorbide mononitrate (IMDUR) 24 hr tablet 60 mg  60 mg Oral Daily Meredith Pel, NP   60 mg at 09/06/15 1039  . levothyroxine (SYNTHROID, LEVOTHROID) tablet 175 mcg  175 mcg Oral Daily Meredith Pel, NP   175 mcg at 09/07/15 0831  . multivitamin (RENA-VIT) tablet 1 tablet  1 tablet Oral QHS Beryle Lathe, MD   1 tablet at 09/06/15 2154  . nitroGLYCERIN (NITROSTAT) SL tablet 0.4 mg  0.4 mg Sublingual Q5 min PRN Meredith Pel, NP      . olopatadine (PATANOL) 0.1 % ophthalmic solution 1 drop  1 drop Both Eyes Daily Meredith Pel, NP   1 drop at 09/06/15 1040  . ondansetron (ZOFRAN) tablet 4 mg  4 mg Oral Q6H PRN Meredith Pel, NP       Or  . ondansetron [facility]) injection 4 mg  4 mg Intravenous Q6H PRN Meredith Pel, NP      . predniSONE (DELTASONE) tablet 20 mg  20 mg Oral Q breakfast Costin Otelia Sergeant, MD   20 mg at 09/07/15 0831  . rosuvastatin (CRESTOR) tablet 10 mg  10 mg Oral Daily Richarda Overlie, MD   10 mg at 09/06/15 1039  . sodium chloride 0.9 % injection 3 mL  3 mL Intravenous Q12H Meredith Pel, NP   3 mL at 09/06/15 2157  . tamsulosin (FLOMAX) capsule 0.4 mg  0.4 mg Oral Daily Meredith Pel, NP   0.4 mg at 09/06/15 1039  . traMADol (ULTRAM) tablet 25 mg  25 mg Oral Q6H PRN Rolly Salter, MD      . Warfarin - Pharmacist Dosing Inpatient   Does not apply  [phone] Newton Pigg, [facility]       Do not use this list as official medication orders. Please verify with discharge summary.  Discharge Medications:   Medication List    ASK your doctor about these medications        acetaminophen 500 MG tablet  Commonly known as:  TYLENOL  Take 500 mg by mouth 2 (two) times daily as needed (pain).     amLODipine 10 MG tablet  Commonly known as:  NORVASC  Take 10 mg by mouth daily.     amoxicillin-clavulanate 500-125 MG tablet  Commonly known as:  AUGMENTIN  Take 1 tablet (500 mg total) by mouth every 12 (twelve) hours.     calcitRIOL 0.25 MCG capsule  Commonly known as:  ROCALTROL  Take 0.25-0.5 mcg by mouth daily. Take 1 tablet ( 0.3225mcg) on odd days and  2 tablets (0.205mcg) on even days as directed     carvedilol 12.5 MG tablet  Commonly known as:  COREG  Take 1 tablet (12.5 mg total) by mouth 2 (two) times daily with a meal.     Cod Liver Oil Oil  Take 15 mLs by mouth daily.     colchicine 0.6 MG tablet  Take 0.5 tablets (0.3 mg total) by mouth daily.     furosemide 40 MG tablet  Commonly known as:  LASIX  Take 1.5 tablets (60 mg total) by mouth 2 (two) times daily.     glipiZIDE 5 MG tablet  Commonly known as:  GLUCOTROL  Take 2.5-5 mg by mouth 2 (two) times daily before a meal. Take 1/2 tablet (2.5 mg) daily before breakfast and 1 tablet (5 mg) before supper     hydrALAZINE 50 MG tablet  Commonly known as:  APRESOLINE  Take 1 tablet (50 mg total) by mouth 3 (three) times daily.     isosorbide mononitrate 60 MG 24 hr tablet  Commonly known as:  IMDUR  Take 1 tablet (60 mg total) by mouth daily.     levothyroxine 175 MCG tablet  Commonly known as:  SYNTHROID, LEVOTHROID  Take 1 tablet (175 mcg total) by mouth daily.     Magnesium 250 MG Tabs  Take 250 mg by mouth daily.     nitroGLYCERIN 0.4 MG SL tablet  Commonly known as:  NITROSTAT  Place 1 tablet (0.4 mg total) under the tongue every 5 (five) minutes as needed for  chest pain.     olopatadine 0.1 % ophthalmic solution  Commonly known as:  PATANOL  Place 1 drop into both eyes 2 (two) times daily.     rosuvastatin 20 MG tablet  Commonly known as:  CRESTOR  Take 1 tablet (20 mg total) by mouth daily.     tamsulosin 0.4 MG Caps capsule  Commonly known as:  FLOMAX  Take 1 capsule (0.4 mg total) by mouth daily.     warfarin 5 MG tablet  Commonly known as:  COUMADIN  Take 1 tablet (5 mg total) by mouth daily.        Relevant Imaging Results:  Relevant Lab Results:  Recent Labs    Additional Information SS# [id number]  Catheryn BaconCharlean Ryman Rathgeber  BSW intern  [phone]

## 2015-09-07 NOTE — Clinical Social Work Note (Signed)
Clinical Social Work Assessment  Patient Details  Name: [name] [name] MRN: [id number] Date of Birth: 09/19/1936  Date of referral:  09/07/15               Reason for consult:  Facility Placement                Permission sought to share information with:  Family Supports Permission granted to share information::  Yes, Verbal Permission Granted  Name::      (Daughter ([name] [name]) Son [name] [name]))  Agency::     Relationship::     Contact Information:     Housing/Transportation Living arrangements for the past 2 months:  Single Family Home Source of Information:  Adult Children Patient Interpreter Needed:  None Criminal Activity/Legal Involvement Pertinent to Current Situation/Hospitalization:  No - Comment as needed Significant Relationships:  Adult Children, Spouse Lives with:    Do you feel safe going back to the place where you live?  Yes Need for family participation in patient care:  Yes (Comment)  Care giving concerns: Patient lives home with wife, does not have 24 hour assistance. PT recommend SNF.   Social Worker assessment / plan:  BSW intern met with patient and patient family at bedside to complete assessment. BSW intern explained the recommendation for SNF and the referral process. Patient family decided that home health would be better than going to a SNF. Patient family stated that they have received home health at home before and is agreeable to that again.  BSW intern notified case manager about home health being set up at home.   Employment status:  Disabled (Comment on whether or not currently receiving Disability) Insurance information:  Medicare PT Recommendations:  [place] (family wants to go home with homehealth) Information / Referral to community resources:  [facility]  Patient/Family's Response to care: Patient and patient family is agreeable with current plan.  Patient/Family's Understanding of and Emotional Response to Diagnosis,  Current Treatment, and Prognosis:  Patient and patient family is very understanding of patient current status and has no questions or concerns at this time.  Emotional Assessment Appearance:  Appears stated age Attitude/Demeanor/Rapport:   (nice, welcoming) Affect (typically observed):  Accepting, Pleasant Orientation:  Oriented to Self, Oriented to Place, Oriented to  Time, Oriented to Situation Alcohol / Substance use:  Never Used Psych involvement (Current and /or in the community):  No (Comment)  Discharge Needs  Concerns to be addressed:  No discharge needs identified Readmission within the last 30 days:  No Current discharge risk:  None Barriers to Discharge:  No Barriers Identified   [place] intern  [phone]

## 2015-09-07 NOTE — Care Management Important Message (Signed)
Important Message  Patient Details  Name: [name] [name] MRN: [id number] Date of Birth: 06/16/1936   Medicare Important Message Given:  Yes    [name] [name] 09/07/2015, 3:03 PM

## 2015-09-07 NOTE — Progress Notes (Signed)
Physical Therapy Treatment Patient Details Name: [name] [name] MRN: [id number] DOB: [birth year] Today's Date: 09/07/2015    History of Present Illness 78 y.o. male with Acute on chronic kidney failure, Metabolic acidosis, and gout, admitted with acute encephalopathy.    PT Comments    Pt was agreeable with encouragement to walk and is motivated to get better.  However, with new HD will be more fatigued and wanted to get him as strengthened as possible going into new treatments   Follow Up Recommendations  SNF     Equipment Recommendations  None recommended by PT    Recommendations for Other Services       Precautions / Restrictions Precautions Precautions: Fall Restrictions Weight Bearing Restrictions: No    Mobility  Bed Mobility Overal bed mobility: Needs Assistance Bed Mobility: Rolling;Supine to Sit;Sit to Supine Rolling: Supervision Sidelying to sit: Min assist;HOB elevated Supine to sit: Min assist Sit to supine: Min assist;Mod assist   General bed mobility comments: support for lifting LE's onto bed and cues for sequence for spinal protection  Transfers Overall transfer level: Needs assistance Equipment used: Rolling walker (2 wheeled);1 person hand held assist Transfers: Sit to/from [employer] Sit to Stand: Min assist Stand pivot transfers: Min assist          Ambulation/Gait Ambulation/Gait assistance: Min assist Ambulation Distance (Feet): 90 Feet Assistive device: Rolling walker (2 wheeled) Gait Pattern/deviations: Step-through pattern;Drifts right/left;Wide base of support (struggles to clear obstacles, catches wheels) Gait velocity: slow Gait velocity interpretation: Below normal speed for age/gender General Gait Details: sensitive not painful to step on LLE due to ankle stiffness   Stairs            Wheelchair Mobility    Modified Rankin (Stroke Patients Only)       Balance Overall balance assessment: Needs  assistance Sitting-balance support: Feet supported Sitting balance-Leahy Scale: Good     Standing balance support: Bilateral upper extremity supported Standing balance-Leahy Scale: Fair Standing balance comment: fair- if dynamic and not attending to walker at first, listed suddenly                    Cognition Arousal/Alertness: Awake/alert Behavior During Therapy: WFL for tasks assessed/performed Overall Cognitive Status: Within Functional Limits for tasks assessed                      Exercises      General Comments General comments (skin integrity, edema, etc.): Pt is demonstrating some clear balance changes with focused assist by PT to cue awareness and safety.  He is not aware of deficits      Pertinent Vitals/Pain Pain Assessment: No/denies pain    Home Living                      Prior Function            PT Goals (current goals can now be found in the care plan section) Acute Rehab PT Goals Patient Stated Goal: Get rehab Progress towards PT goals: Progressing toward goals    Frequency  Min 2X/week    PT Plan Current plan remains appropriate    Co-evaluation             End of Session Equipment Utilized During Treatment: Gait belt Activity Tolerance: Patient limited by fatigue;Other (comment) (stiffness in L ankle) Patient left: in bed;with call bell/phone within reach;with nursing/sitter in room;Other (comment) (HD team had just arrived)  Time: [id number] PT Time Calculation (min) (ACUTE ONLY): 29 min  Charges:  $Gait Training: 8-22 mins $Therapeutic Activity: 8-22 mins                    G Codes:      Ivar DrapeStout, Noralyn Karim E 09/07/2015, 4:33 PM   Samul Dadauth Mekayla Soman, PT MS Acute Rehab Dept. Number: [facility] [phone] and [facility] [phone]

## 2015-09-07 NOTE — Sedation Documentation (Signed)
Patient is resting comfortably. 

## 2015-09-07 NOTE — Sedation Documentation (Signed)
Vital signs stable. 

## 2015-09-07 NOTE — Progress Notes (Signed)
[facility] Progress Note    Patient: [name] [name]    BOF:[id number]  DOB: 02-Apr-1936     DOA: 09/03/2015 Date of Service: the patient was seen and examined on 09/07/2015  Subjective: patient denies any acute complaints. The pain in the leg is improving. Shortness of breath is resolved as well. Tolerating hemodialysis very well. Nutrition: Able to tolerate oral diet. Activity: currently on bedrest Last BM: 09/06/2015  Assessment and Plan: 1. Acute on chronic kidney failure [facility]) Patient presents with complaints of worsening renal function with confusion. Nephrology was consulted and recommend to start hemodialysis as well as Lasix. The patient underwent TDC  placement today. Vascular surgery was also consulted for a possible fistula placement on Friday. We will continue to hold warfarin until that procedure.  2. Metabolic acidosis Increased shortness of breath. Resolved with hemodialysis.  3. gout. Continue with prednisone. Patient mentions symptoms are improving significantly. Start on low-dose tramadol. Most likely with improvement with hemodialysis  4. acute encephalopathy. An episode of unresponsiveness. Currently improving as well. Patient initially presented with confusion and acute encephalopathy which was thought to be secondary to narcotics. Most likely this is toxic metabolic and nephropathy secondary to uremia.  5  Hypothyroidism TSH within normal limit. Continue Synthroid.  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  7  Essential hypertension Monitor blood pressure.  8  Atrial fibrillation (HCC) Currently rate controlled. INR was reversed for [facility] placement. Will continue to hold warfarin for vascular graft procedure scheduled on Friday.  9  HCAP (healthcare-associated pneumonia) Patient had pneumonia last admission which is currently  improving. Continuing Augmentin to complete the course. Last dose 09/08/2015  DVT Prophylaxis: SCD Nutrition: Renal diet   Advance goals of care discussion: Full code  Brief Summary of Hospitalization:  HPI: From the H&P "[name] [name] is a 79 y.o. male with multiple medical problems not limited to atrial fibrillation on Coumadin, diabetes, hypertension ,coronary artery disease, ischemic cardiomyopathy and chronic kidney disease. Patient was discharged from the hospital 2 days ago after a three-day stay for HCAP and acute on chronic combined heart failure. Patient was discharged home on Augmentin. Home health nurse recommended patient return to emergency department today. Patient really has not been out of bed since hospital discharge. Family having problems eating rolling him over in bed secondary to weakness patient denies chest pain, shortness of breath, cough, fever or chills. Patient states he has been eating and drinking. He reports good urine output, wife thinks urine output might be slightly less than normal. " Daily update: Admitted on 09/03/2015 with confusion most at this secondary to medication. Improved but had one episode of unresponsiveness on 09/04/2015. 09/05/2015 nephrology consulted amlodipine discontinued  09/06/2015. Renal function continues to worsen. Patient will be given vitamin K and FFP to reverse the INR. 09/07/2015 TDC placed and the patient underwent first hemodialysis treatment. Consultants: Nephrology  Procedures: [facility] placement, hemodialysis Antibiotics: Augmentin 09/01/2015  start date end date 09/08/2015  Disposition:  Expected discharge date: 09/09/2015  Barriers to safe discharge: physical therapy evaluation   Intake/Output Summary (Last 24 hours) at 09/07/15 1730 Last data filed at 09/07/15 1314  Gross per 24 hour  Intake    930 ml  Output    450 ml  Net    480 ml   Filed Weights   09/06/15 0632 09/07/15 0437 09/07/15 1427  Weight: 90 kg (198  lb 6.6  oz) 89.2 kg (196 lb 10.4 oz) 89.3 kg (196 lb 13.9 oz)    Objective: Physical Exam: Filed Vitals:   09/07/15 1501 09/07/15 1530 09/07/15 1630 09/07/15 1700  BP: 128/77 142/87 139/79 147/80  Pulse: 66 70 70 69  Temp:      TempSrc:      Resp:      Height:      Weight:      SpO2:       General: Appear in moderate distress, no Rash; Oral Mucosa moist. Cardiovascular: S1 and S2 Present, no Murmur, difficult to assess  JVD Respiratory: Bilateral Air entry present and  basal Crackles, no wheezes Abdomen: Bowel Sound present, Soft and no tenderness Extremities: Bilateral  Pedal edema, no calf tenderness  Data Reviewed: CBC:  Recent Labs Lab 09/03/15 1356 09/03/15 1406 09/04/15 0345 09/05/15 0156 09/06/15 0348 09/07/15 0322  WBC 5.9  --  4.0 4.6 6.4 4.9  NEUTROABS 3.8  --   --   --  5.0  --   HGB 9.2* 10.5* 8.6* 8.5* 8.9* 8.5*  HCT 28.7* 31.0* 26.8* 26.3* 27.6* 27.4*  MCV 92.9  --  93.4 92.9 92.0 92.9  PLT 118*  --  121* 123* 144* 355*   Basic Metabolic Panel:  Recent Labs Lab 09/01/15 0244 09/03/15 1406 09/04/15 0345 09/05/15 0156 09/06/15 0348 09/07/15 0322  NA 137 138 137 137 134* 136  K 5.2* 4.8 5.2* 5.2* 5.5* 5.5*  CL 109 108 108 108 106 107  CO2 17*  --  16* 16* 15* 17*  GLUCOSE 110* 86 182* 202* 236* 243*  BUN 79* 97* 107* 119* 130* 141*  CREATININE 4.79* 5.90* 6.22* 6.60* 6.80* 7.00*  CALCIUM 8.4*  --  8.6* 8.5* 8.7* 8.8*  MG  --   --   --   --   --  2.2   Liver Function Tests:  Recent Labs Lab 09/04/15 0345 09/05/15 0156 09/06/15 0348  AST 57* 55* 50*  ALT 34 34 39  ALKPHOS 57 57 55  BILITOT 0.4 0.6 0.5  PROT 5.6* 6.1* 6.2*  ALBUMIN 2.5* 2.4* 2.6*   No results for input(s): LIPASE, AMYLASE in the last 168 hours. No results for input(s): AMMONIA in the last 168 hours.  Cardiac Enzymes:  Recent Labs Lab 09/04/15 1250  TROPONINI <0.03   BNP (last 3 results)  Recent Labs  12/10/14 0530 08/29/15 1235 09/03/15 1356  BNP 326.5*  1483.1* 1191.8*    ProBNP (last 3 results) No results for input(s): PROBNP in the last 8760 hours.   CBG:  Recent Labs Lab 09/06/15 1119 09/06/15 1633 09/06/15 2129 09/07/15 0612 09/07/15 1221  GLUCAP 123* 145* 243* 194* 182*    Recent Results (from the past 240 hour(s))  Urine culture     Status: None   Collection Time: 08/29/15  1:53 PM  Result Value Ref Range Status   Specimen Description URINE, CLEAN CATCH  Final   Special Requests NONE  Final   Culture MULTIPLE SPECIES PRESENT, SUGGEST RECOLLECTION  Final   Report Status 08/31/2015 FINAL  Final  Culture, blood (routine x 2) Call MD if unable to obtain prior to antibiotics being given     Status: None   Collection Time: 08/29/15  7:00 PM  Result Value Ref Range Status   Specimen Description BLOOD LEFT ANTECUBITAL  Final   Special Requests BOTTLES DRAWN AEROBIC AND ANAEROBIC 10CC  Final   Culture NO GROWTH 5 DAYS  Final   Report Status  09/03/2015 FINAL  Final  Culture, blood (routine x 2) Call MD if unable to obtain prior to antibiotics being given     Status: None   Collection Time: 08/29/15  7:05 PM  Result Value Ref Range Status   Specimen Description BLOOD LEFT ARM  Final   Special Requests BOTTLES DRAWN AEROBIC AND ANAEROBIC 10CC  Final   Culture NO GROWTH 5 DAYS  Final   Report Status 09/03/2015 FINAL  Final  MRSA PCR Screening     Status: None   Collection Time: 09/01/15  9:06 AM  Result Value Ref Range Status   MRSA by PCR NEGATIVE NEGATIVE Final    Comment:        The GeneXpert MRSA Assay (FDA approved for NASAL specimens only), is one component of a comprehensive MRSA colonization surveillance program. It is not intended to diagnose MRSA infection nor to guide or monitor treatment for MRSA infections.      Studies: Ir Fluoro Guide Cv Line Right  09/07/2015  INDICATION: 79 year old male with a end-stage renal disease now requiring hemodialysis. EXAM: TUNNELED CENTRAL VENOUS HEMODIALYSIS  CATHETER PLACEMENT WITH ULTRASOUND AND FLUOROSCOPIC GUIDANCE MEDICATIONS: Ancef 2 gm IV; The IV antibiotic was given in an appropriate time interval prior to skin puncture. CONTRAST:  None ANESTHESIA/SEDATION: Versed 1 mg IV; Fentanyl 50 mcg IV Total Moderate Sedation Time Twenty minutes. FLUOROSCOPY TIME:  54 seconds for a total of 33.2 mGy COMPLICATIONS: None immediate PROCEDURE: Informed written consent was obtained from the patient after a discussion of the risks, benefits, and alternatives to treatment. Questions regarding the procedure were encouraged and answered. The right neck and chest were prepped with chlorhexidine in a sterile fashion, and a sterile drape was applied covering the operative field. Maximum barrier sterile technique with sterile gowns and gloves were used for the procedure. A timeout was performed prior to the initiation of the procedure. After creating a small venotomy incision, a micropuncture kit was utilized to access the right internal jugular vein under direct, real-time ultrasound guidance after the overlying soft tissues were anesthetized with 1% lidocaine with epinephrine. Ultrasound image documentation was performed. The microwire was kinked to measure appropriate catheter length. A stiff glidewire was advanced to the level of the IVC and the micropuncture sheath was exchanged for a peel-away sheath. A Palindrome tunneled hemodialysis catheter measuring 28 cm from tip to cuff was tunneled in a retrograde fashion from the anterior chest wall to the venotomy incision. The catheter was then placed through the peel-away sheath with tips ultimately positioned within the mid right atrium. Final catheter positioning was confirmed and documented with a spot radiographic image. The catheter aspirates and flushes normally. The catheter was flushed with appropriate volume heparin dwells. The catheter exit site was secured with a 0-Prolene retention suture. The venotomy incision was closed  with an interrupted 4-0 Vicryl, and the epidermis sealed with Dermabond. Dressings were applied. The patient tolerated the procedure well without immediate post procedural complication. IMPRESSION: Successful placement of 28 cm tip to cuff tunneled hemodialysis catheter via the right internal jugular vein with tips terminating within the mid right atrium. The catheter is ready for immediate use. Signed, Criselda Peaches, MD [facility] Radiology Electronically Signed   By: Jacqulynn Cadet M.D.   On: 09/07/2015 10:29   Ir US Guide Vasc Access Right  09/07/2015  INDICATION: 79 year old male with a end-stage renal disease now requiring hemodialysis. EXAM: TUNNELED CENTRAL VENOUS HEMODIALYSIS CATHETER PLACEMENT WITH ULTRASOUND AND  FLUOROSCOPIC GUIDANCE MEDICATIONS: Ancef 2 gm IV; The IV antibiotic was given in an appropriate time interval prior to skin puncture. CONTRAST:  None ANESTHESIA/SEDATION: Versed 1 mg IV; Fentanyl 50 mcg IV Total Moderate Sedation Time Twenty minutes. FLUOROSCOPY TIME:  54 seconds for a total of 37.5 mGy COMPLICATIONS: None immediate PROCEDURE: Informed written consent was obtained from the patient after a discussion of the risks, benefits, and alternatives to treatment. Questions regarding the procedure were encouraged and answered. The right neck and chest were prepped with chlorhexidine in a sterile fashion, and a sterile drape was applied covering the operative field. Maximum barrier sterile technique with sterile gowns and gloves were used for the procedure. A timeout was performed prior to the initiation of the procedure. After creating a small venotomy incision, a micropuncture kit was utilized to access the right internal jugular vein under direct, real-time ultrasound guidance after the overlying soft tissues were anesthetized with 1% lidocaine with epinephrine. Ultrasound image documentation was performed. The microwire was  kinked to measure appropriate catheter length. A stiff glidewire was advanced to the level of the IVC and the micropuncture sheath was exchanged for a peel-away sheath. A Palindrome tunneled hemodialysis catheter measuring 28 cm from tip to cuff was tunneled in a retrograde fashion from the anterior chest wall to the venotomy incision. The catheter was then placed through the peel-away sheath with tips ultimately positioned within the mid right atrium. Final catheter positioning was confirmed and documented with a spot radiographic image. The catheter aspirates and flushes normally. The catheter was flushed with appropriate volume heparin dwells. The catheter exit site was secured with a 0-Prolene retention suture. The venotomy incision was closed with an interrupted 4-0 Vicryl, and the epidermis sealed with Dermabond. Dressings were applied. The patient tolerated the procedure well without immediate post procedural complication. IMPRESSION: Successful placement of 28 cm tip to cuff tunneled hemodialysis catheter via the right internal jugular vein with tips terminating within the mid right atrium. The catheter is ready for immediate use. Signed, Sterling Big, MD [facility] Radiology Electronically Signed   By: Malachy Moan M.D.   On: 09/07/2015 10:29     Scheduled Meds: . allopurinol  100 mg Oral Daily  . amoxicillin-clavulanate  1 tablet Oral Q24H  . calcitRIOL  0.25 mcg Oral Q48H   And  . calcitRIOL  0.5 mcg Oral Q48H  . calcium acetate  667 mg Oral TID WC  . carvedilol  3.125 mg Oral BID WC  .  ceFAZolin (ANCEF) IV  2 g Intravenous to XRAY  . Darbepoetin Alfa      . [START ON 09/14/2015] darbepoetin (ARANESP) injection - DIALYSIS  200 mcg Intravenous Q Tue-HD  . docusate sodium  100 mg Oral BID  . fentaNYL      . ferric gluconate (FERRLECIT/NULECIT) IV  125 mg Intravenous Daily  . gelatin adsorbable      . heparin      . heparin  40  Units/kg Dialysis Once in dialysis  . insulin aspart  0-5 Units Subcutaneous QHS  . insulin aspart  0-9 Units Subcutaneous TID WC  . isosorbide mononitrate  60 mg Oral QHS  . levothyroxine  175 mcg Oral Daily  . lidocaine      . midazolam      . multivitamin  1 tablet Oral QHS  . olopatadine  1 drop Both Eyes Daily  . predniSONE  20 mg Oral Q breakfast  . rosuvastatin  10 mg Oral Daily  . sodium chloride  3 mL Intravenous Q12H  . tamsulosin  0.4 mg Oral Daily  . Warfarin - Pharmacist Dosing Inpatient   Does not apply q1800   Continuous Infusions:   Time spent: 30 minutes   Author: Berle Mull, MD [facility] Pager: [phone] 09/07/2015 5:30 PM  If 7PM-7AM, please contact night-coverage at [email or web address], password [facility]

## 2015-09-07 NOTE — Progress Notes (Addendum)
  [facility] Progress Note  Right IJ TDC placed this am by IR. Vein mapping reviewed. The patient is right handed. Patient has adequate vein conduit for left brachial-cephalic AV fistula. Plan for left arm brachial-cephalic AV fistula creation on Friday, 09/10/15 with Dr. Lawson. INR will need to be less than 1.6. Discussed with the family and patient and they are willing to proceed.   Kimberly Trinh, PA-C [facility] Office: [phone] Pager: [phone] 09/07/2015 10:50 AM     Addendum  As noted above.  Risk, benefits, and alternatives to access surgery were discussed.  The patient is aware the risks include but are not limited to: bleeding, infection, steal syndrome, nerve damage, ischemic monomelic neuropathy, failure to mature, need for additional procedures, death and stroke.  The patient agrees to proceed forward with the procedure.   Shalita Notte, MD [facility] [place] Office: [phone] Pager: [phone]  09/07/2015, 2:20 PM   

## 2015-09-07 NOTE — Progress Notes (Signed)
Patient is active with [facility] as prior to admission for HHRN/ PT/ OT /nurses adie. Abelino DerrickB Sharleen Szczesny T Surgery Center IncRN,MHA,BSN [phone]

## 2015-09-07 NOTE — Progress Notes (Addendum)
Inpatient Diabetes Program Recommendations  AACE/ADA: New Consensus Statement on Inpatient Glycemic Control (2015)  Target Ranges:  Prepandial:   less than 140 mg/dL      Peak postprandial:   less than 180 mg/dL (1-2 hours)      Critically ill patients:  140 - 180 mg/dL   Review of Glycemic Control  Results for Ernestine ConradWILLIAMSON, Bryten L (MRN [id number]) as of 09/07/2015 11:17  Ref. Range 09/06/2015 06:21 09/06/2015 11:19 09/06/2015 16:33 09/06/2015 21:29 09/07/2015 06:12  Glucose-Capillary Latest Ref Range: 65-99 mg/dL 409189 (H) 811123 (H) 914145 (H) 243 (H) 194 (H)   Diabetes history: DM 2 Outpatient Diabetes medications: Glipizide 2.5 mg QAm, 5 mg QPM Current orders for Inpatient glycemic control: Novolog 0-9 units tid, Novolog 0-5units qhs  Inpatient Diabetes Program Recommendations:Consider increasing Novolog correction insulin to resistant scale while the patient is on steroids- blood sugars are elevated. Taper/ D/C insulin as steroids are discontinued.   Susette RacerJulie Korryn Pancoast, RN, BA, MHA, CDE Diabetes Coordinator Inpatient Diabetes Program  [phone] (Team Pager) [phone] [facility] Office) 09/07/2015 11:35 AM

## 2015-09-07 NOTE — Procedures (Signed)
I was present at this session.  I have reviewed the session itself and made appropriate changes.  HD via PC, bp in 130s,  Cath flow 200, tol well.    Hoyle Barkdull L 11/22/20164:42 PM

## 2015-09-07 NOTE — Procedures (Signed)
Interventional Radiology Procedure Note  Procedure: Placement of a Right IJ approach 29 cm tip-to-cuff Plaindrome tunneled HD catheter.  Tip in the mid RA and ready for use.   Complications: None  Estimated Blood Loss: 0  Recommendations: - Routine line care  Signed,  Sterling BigHeath K. McCullough, MD

## 2015-09-08 DIAGNOSIS — N179 Acute kidney failure, unspecified: Secondary | ICD-10-CM

## 2015-09-08 DIAGNOSIS — N189 Chronic kidney disease, unspecified: Secondary | ICD-10-CM

## 2015-09-08 LAB — RENAL FUNCTION PANEL
ALBUMIN: 2.5 g/dL — AB (ref 3.5–5.0)
ANION GAP: 11 (ref 5–15)
BUN: 95 mg/dL — ABNORMAL HIGH (ref 6–20)
CO2: 21 mmol/L — ABNORMAL LOW (ref 22–32)
Calcium: 8.5 mg/dL — ABNORMAL LOW (ref 8.9–10.3)
Chloride: 105 mmol/L (ref 101–111)
Creatinine, Ser: 4.96 mg/dL — ABNORMAL HIGH (ref 0.61–1.24)
GFR calc Af Amer: 12 mL/min — ABNORMAL LOW (ref >60)
GFR calc non Af Amer: 10 mL/min — ABNORMAL LOW (ref >60)
GLUCOSE: 219 mg/dL — AB (ref 65–99)
PHOSPHORUS: 5.3 mg/dL — AB (ref 2.5–4.6)
POTASSIUM: 4.3 mmol/L (ref 3.5–5.1)
Sodium: 137 mmol/L (ref 135–145)

## 2015-09-08 LAB — GLUCOSE, CAPILLARY
GLUCOSE-CAPILLARY: 215 mg/dL — AB (ref 65–99)
GLUCOSE-CAPILLARY: 332 mg/dL — AB (ref 65–99)
GLUCOSE-CAPILLARY: 316 mg/dL — AB (ref 65–99)
Glucose-Capillary: 163 mg/dL — ABNORMAL HIGH (ref 65–99)
Glucose-Capillary: 241 mg/dL — ABNORMAL HIGH (ref 65–99)
Glucose-Capillary: 315 mg/dL — ABNORMAL HIGH (ref 65–99)

## 2015-09-08 LAB — CBC
HEMATOCRIT: 26.4 % — AB (ref 39.0–52.0)
HEMOGLOBIN: 8.5 g/dL — AB (ref 13.0–17.0)
MCH: 29.7 pg (ref 26.0–34.0)
MCHC: 32.2 g/dL (ref 30.0–36.0)
MCV: 92.3 fL (ref 78.0–100.0)
Platelets: 59 10*3/uL — ABNORMAL LOW (ref 150–400)
RBC: 2.86 MIL/uL — ABNORMAL LOW (ref 4.22–5.81)
RDW: 14.5 % (ref 11.5–15.5)
WBC: 5.6 10*3/uL (ref 4.0–10.5)

## 2015-09-08 LAB — APTT
aPTT: 51 seconds — ABNORMAL HIGH (ref 24–37)
aPTT: 52 seconds — ABNORMAL HIGH (ref 24–37)

## 2015-09-08 LAB — PROTIME-INR
INR: 1.58 — ABNORMAL HIGH (ref 0.00–1.49)
Prothrombin Time: 18.9 seconds — ABNORMAL HIGH (ref 11.6–15.2)

## 2015-09-08 MED ORDER — SODIUM CHLORIDE 0.9 % IJ SOLN
3.0000 mL | Freq: Two times a day (BID) | INTRAMUSCULAR | Status: DC
  Administered 2015-09-08 – 2015-09-13 (×5): 3 mL via INTRAVENOUS

## 2015-09-08 MED ORDER — ANTICOAGULANT SODIUM CITRATE 4% (200MG/5ML) IV SOLN
5.0000 mL | Status: DC | PRN
  Filled 2015-09-08: qty 250

## 2015-09-08 MED ORDER — TRAMADOL HCL 50 MG PO TABS
ORAL_TABLET | ORAL | Status: AC
  Filled 2015-09-08: qty 1

## 2015-09-08 MED ORDER — SODIUM CHLORIDE 0.9 % IV SOLN: 250.0000 mL | INTRAVENOUS | Status: DC | PRN

## 2015-09-08 MED ORDER — SODIUM CHLORIDE 0.9 % IJ SOLN: 3.0000 mL | INTRAMUSCULAR | Status: DC | PRN

## 2015-09-08 MED ORDER — ARGATROBAN 50 MG/50ML IV SOLN
1.0000 ug/kg/min | INTRAVENOUS | Status: DC
  Administered 2015-09-08 – 2015-09-09 (×2): 1 ug/kg/min via INTRAVENOUS
  Filled 2015-09-08 (×2): qty 50

## 2015-09-08 MED ORDER — ONDANSETRON HCL 4 MG/2ML IJ SOLN
INTRAMUSCULAR | Status: AC
  Administered 2015-09-08: 4 mg via INTRAVENOUS
  Filled 2015-09-08: qty 2

## 2015-09-08 NOTE — Progress Notes (Signed)
Results for Ernestine ConradWILLIAMSON, Bryon L (MRN [id number]) as of 09/08/2015 12:17  Ref. Range 09/07/2015 12:21 09/07/2015 18:17 09/07/2015 21:26 09/08/2015 06:47 09/08/2015 11:18  Glucose-Capillary Latest Ref Range: 65-99 mg/dL 478182 (H) 295244 (H) 621279 (H) 163 (H) 215 (H)  Noted that blood sugars greater than 180 mg/dl. Recommend changing Novolog correction scale to MODERATE AC & HS if CBGs continue to be greater than 180 mg/dl and while on steroids. Smith MinceKendra Scottlyn Mchaney RN BSN CDE

## 2015-09-08 NOTE — Consult Note (Signed)
   [facility]   09/08/2015  Ernestine ConradRoy L Gassen 08/20/1936 [id number] Came by to meet with the patient for evaluation for care management services and the patient was being seen by the physician. Will follow up with the patient at a later time for care management needs.  For questions, please contact: Charlesetta ShanksVictoria Shontia Gillooly, RN BSN CCM [facility] Liaison  [phone] business mobile phone

## 2015-09-08 NOTE — Procedures (Signed)
I was present at this session.  I have reviewed the session itself and made appropriate changes.  HD via PC, stom dyscomfort,no bp change, ? midl dysequilib., lower flows.  Mardell Suttles L 11/23/20169:25 AM

## 2015-09-08 NOTE — Progress Notes (Addendum)
ANTICOAGULATION CONSULT NOTE - Follow Up Consult  Pharmacy Consult for argatroban Indication: r/o HIT  Allergies  Allergen Reactions  . Tomato Other (See Comments)    Too much acid - cannot take due to kidney issues    Patient Measurements: Height: 5\' 9"  (175.3 cm) Weight: 180 lb 5.4 oz (81.8 kg) IBW/kg (Calculated) : 70.7  Vital Signs: Temp: 98.3 F (36.8 C) (11/23 1005) Temp Source: Oral (11/23 1005) BP: 140/75 mmHg (11/23 1044) Pulse Rate: 71 (11/23 1044)  Labs:  Recent Labs  09/07/15 0322 09/07/15 1201 09/07/15 2017 09/08/15 0413 09/08/15 1630  HGB 8.5*  --  8.8* 8.5*  --   HCT 27.4*  --  27.2* 26.4*  --   PLT 148*  --  68* 59*  --   APTT  --   --   --   --  51*  LABPROT 20.2* 19.0*  --  18.9*  --   INR 1.73* 1.59*  --  1.58*  --   CREATININE 7.00*  --  4.93* 4.96*  --     Estimated Creatinine Clearance: 12.5 mL/min (by C-G formula based on Cr of 4.96).   Medications:  Scheduled:  . allopurinol  100 mg Oral Daily  . calcitRIOL  0.25 mcg Oral Q48H   And  . calcitRIOL  0.5 mcg Oral Q48H  . calcium acetate  667 mg Oral TID WC  . [START ON 09/14/2015] darbepoetin (ARANESP) injection - DIALYSIS  200 mcg Intravenous Q Tue-HD  . docusate sodium  100 mg Oral BID  . ferric gluconate (FERRLECIT/NULECIT) IV  125 mg Intravenous Daily  . insulin aspart  0-5 Units Subcutaneous QHS  . insulin aspart  0-9 Units Subcutaneous TID WC  . isosorbide mononitrate  60 mg Oral QHS  . levothyroxine  175 mcg Oral Daily  . multivitamin  1 tablet Oral QHS  . olopatadine  1 drop Both Eyes Daily  . predniSONE  20 mg Oral Q breakfast  . rosuvastatin  10 mg Oral Daily  . sodium chloride  3 mL Intravenous Q12H  . sodium chloride  3 mL Intravenous Q12H  . tamsulosin  0.4 mg Oral Daily  . Warfarin - Pharmacist Dosing Inpatient   Does not apply q1800    Assessment: 79 yo male with ESRD and new HD start. He is now noted with concern for HIT and pharmacy consulted to dose argatroban  for r/o HIT. I have also contacted renal and made them aware of the initiation of argatroban.   -Noted on heparin sq 11/18-11/19, heparin also given in HD 11/22 -Platelet count 59 with trend down. Platelet count has been 112-148 this admit (112 on first day of heparin sq) -AST/ALT= 50/39, INR= 1.58  11/23: HIT antibody ordered  PM follow-up: aPTT within goal range at 51  Goal of Therapy:  APTT 50-90 Monitor platelets by anticoagulation protocol: Yes   Plan:  -Continue argatroban 401mcg/kg/min -Recheck aPTT in 2 hours and daily -Will follow HIT antibody results  Tad MooreJessica Bresha Hosack, Pharm D, BCPS  Clinical Pharmacist Pager [phone]  09/08/2015 6:30 PM    Addendum: Follow-up aPTT remains stable at 52 seconds.  Will leave at current rate for now.  F/u AM PTT.  Tad MooreJessica Mylah Baynes, Pharm D, BCPS  Clinical Pharmacist Pager [phone]  09/08/2015 8:06 PM

## 2015-09-08 NOTE — Progress Notes (Signed)
OT Cancellation Note  Patient Details Name: [name] ConradRoy L Canal MRN: [id number] DOB: 04/06/1936   Cancelled Treatment:    Reason Eval/Treat Not Completed: Other (comment). Pt reports he is just too tired from dialysis earlier today. Tried to encourage him to get up even if it was just to get up to recliner and he politely refused  Evette GeorgesLeonard, Jeroline Wolbert Eva [phone] 09/08/2015, 12:23 PM

## 2015-09-08 NOTE — Progress Notes (Signed)
[facility] Progress Note    Patient: [name] [name]    HQI:[id number]  DOB: 18-May-1936     DOA: 09/03/2015 Date of Service: the patient was seen and examined on 09/08/2015  Brief Summary of Hospitalization:  HPI: From the H&P "JAIMESON GOPAL is a 79 y.o. male with multiple medical problems not limited to atrial fibrillation on Coumadin, diabetes, hypertension ,coronary artery disease, ischemic cardiomyopathy and chronic kidney disease. Patient was discharged from the hospital 2 days ago after a three-day stay for HCAP and acute on chronic combined heart failure. Patient was discharged home on Augmentin. Home health nurse recommended patient return to emergency department today. Patient really has not been out of bed since hospital discharge. Family having problems eating rolling him over in bed secondary to weakness patient denies chest pain, shortness of breath, cough, fever or chills. Patient states he has been eating and drinking. He reports good urine output, wife thinks urine output might be slightly less than normal. " Daily update: Admitted on 09/03/2015 with confusion most at this secondary to medication. Improved but had one episode of unresponsiveness on 09/04/2015. 09/05/2015 nephrology consulted amlodipine discontinued  09/06/2015. Renal function continues to worsen. Patient will be given vitamin K and FFP to reverse the INR. 09/07/2015 TDC placed and the patient underwent first hemodialysis treatment. Consultants: Nephrology  Procedures: [facility] placement, hemodialysis Antibiotics: Augmentin 09/01/2015  start date end date 09/08/2015     Assessment and Plan: 1. Acute on chronic kidney failure [facility]) Patient presents with complaints of worsening renal function with confusion. Nephrology was consulted and recommend to start hemodialysis as well as Lasix. The patient underwent [facility]  Placement 11/22 Vascular surgery was also consulted for a possible fistula placement on  09/10/15 hold warfarin until that procedure.  2. Metabolic acidosis Increased shortness of breath. Resolved with hemodialysis.  Thrombocytopenia first noticed on 11/22 Would alert pharmacy to this and consider argatroban Send off HIT panel  3. gout. Continue with prednisone.  improving significantly. Start on low-dose tramadol. Most likely with improvement with hemodialysis  4. acute encephalopathy. Patient initially presented with confusion and acute encephalopathy which was thought to be secondary to narcotics. 2/2 to  nephropathy secondary to uremia.  5  Hypothyroidism TSH within normal limit. 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 Sugars 163-219  7  Essential hypertension Slightly ?  blood pressure. Monitor overnight and adjust meds as necessary  8  Atrial fibrillation (HCC) Currently rate controlled. INR was reversed for [facility] placement. Will continue to hold warfarin for vascular graft procedure scheduled on Friday.  9  HCAP (healthcare-associated pneumonia) Patient had pneumonia last admission which is currently improving. Continuing Augmentin to complete the course. Last dose 09/08/2015  DVT Prophylaxis: SCD Nutrition: Renal diet   Advance goals of care discussion: Full code    Disposition:  Expected discharge date: 09/09/2015  Barriers to safe discharge: physical therapy evaluation   Intake/Output Summary (Last 24 hours) at 09/08/15 1251 Last data filed at 09/08/15 1005  Gross per 24 hour  Intake    470 ml  Output   5750 ml  Net  -5280 ml   Filed Weights   09/07/15 1427 09/07/15 1737 09/08/15 0448  Weight: 89.3 kg (196 lb 13.9 oz) 85.9 kg (189 lb 6 oz) 84.6 kg (186 lb 8.2 oz)    Objective: Physical Exam: Filed Vitals:   09/08/15 0930  09/08/15 1000 09/08/15 1005 09/08/15 1044  BP: 144/79  155/80 140/75    Pulse: 70  70 71  Temp:   98.3 F (36.8 C)   TempSrc:   Oral   Resp: 16 16 18    Height:      Weight:      SpO2:    95%   General: Appear in moderate distress, no Rash; Oral Mucosa moist. Cardiovascular: S1 and S2 Present, no Murmur, difficult to assess  JVD Respiratory: Bilateral Air entry present and  basal Crackles, no wheezes Abdomen: Bowel Sound present, Soft and no tenderness Extremities: Bilateral  Pedal edema, no calf tenderness  Data Reviewed: CBC:  Recent Labs Lab 09/03/15 1356  09/05/15 0156 09/06/15 0348 09/07/15 0322 09/07/15 2017 09/08/15 0413  WBC 5.9  < > 4.6 6.4 4.9 5.7 5.6  NEUTROABS 3.8  --   --  5.0  --   --   --   HGB 9.2*  < > 8.5* 8.9* 8.5* 8.8* 8.5*  HCT 28.7*  < > 26.3* 27.6* 27.4* 27.2* 26.4*  MCV 92.9  < > 92.9 92.0 92.9 91.3 92.3  PLT 118*  < > 123* 144* 148* 68* 59*  < > = values in this interval not displayed. Basic Metabolic Panel:  Recent Labs Lab 09/05/15 0156 09/06/15 0348 09/07/15 0322 09/07/15 2017 09/08/15 0413  NA 137 134* 136 137 137  K 5.2* 5.5* 5.5* 4.4 4.3  CL 108 106 107 104 105  CO2 16* 15* 17* 21* 21*  GLUCOSE 202* 236* 243* 300* 219*  BUN 119* 130* 141* 95* 95*  CREATININE 6.60* 6.80* 7.00* 4.93* 4.96*  CALCIUM 8.5* 8.7* 8.8* 8.4* 8.5*  MG  --   --  2.2  --   --   PHOS  --   --   --  5.5* 5.3*   Liver Function Tests:  Recent Labs Lab 09/04/15 0345 09/05/15 0156 09/06/15 0348 09/07/15 2017 09/08/15 0413  AST 57* 55* 50*  --   --   ALT 34 34 39  --   --   ALKPHOS 57 57 55  --   --   BILITOT 0.4 0.6 0.5  --   --   PROT 5.6* 6.1* 6.2*  --   --   ALBUMIN 2.5* 2.4* 2.6* 2.7* 2.5*   No results for input(s): LIPASE, AMYLASE in the last 168 hours. No results for input(s): AMMONIA in the last 168 hours.  Cardiac Enzymes:  Recent Labs Lab 09/04/15 1250  TROPONINI <0.03   BNP (last 3 results)  Recent Labs  12/10/14 0530 08/29/15 1235 09/03/15 1356  BNP 326.5* 1483.1* 1191.8*    ProBNP (last 3  results) No results for input(s): PROBNP in the last 8760 hours.   CBG:  Recent Labs Lab 09/07/15 1221 09/07/15 1817 09/07/15 2126 09/08/15 0647 09/08/15 1118  GLUCAP 182* 244* 279* 163* 215*    Recent Results (from the past 240 hour(s))  Urine culture     Status: None   Collection Time: 08/29/15  1:53 PM  Result Value Ref Range Status   Specimen Description URINE, CLEAN CATCH  Final   Special Requests NONE  Final   Culture MULTIPLE SPECIES PRESENT, SUGGEST RECOLLECTION  Final   Report Status 08/31/2015 FINAL  Final  Culture, blood (routine x 2) Call MD if unable to obtain prior to antibiotics being given     Status: None   Collection Time: 08/29/15  7:00 PM  Result Value Ref Range Status  Specimen Description BLOOD LEFT ANTECUBITAL  Final   Special Requests BOTTLES DRAWN AEROBIC AND ANAEROBIC 10CC  Final   Culture NO GROWTH 5 DAYS  Final   Report Status 09/03/2015 FINAL  Final  Culture, blood (routine x 2) Call MD if unable to obtain prior to antibiotics being given     Status: None   Collection Time: 08/29/15  7:05 PM  Result Value Ref Range Status   Specimen Description BLOOD LEFT ARM  Final   Special Requests BOTTLES DRAWN AEROBIC AND ANAEROBIC 10CC  Final   Culture NO GROWTH 5 DAYS  Final   Report Status 09/03/2015 FINAL  Final  MRSA PCR Screening     Status: None   Collection Time: 09/01/15  9:06 AM  Result Value Ref Range Status   MRSA by PCR NEGATIVE NEGATIVE Final    Comment:        The GeneXpert MRSA Assay (FDA approved for NASAL specimens only), is one component of a comprehensive MRSA colonization surveillance program. It is not intended to diagnose MRSA infection nor to guide or monitor treatment for MRSA infections.      Studies: No results found.   Scheduled Meds: . allopurinol  100 mg Oral Daily  . amoxicillin-clavulanate  1 tablet Oral Q24H  . calcitRIOL  0.25 mcg Oral Q48H   And  . calcitRIOL  0.5 mcg Oral Q48H  . calcium acetate  667  mg Oral TID WC  . [START ON 09/14/2015] darbepoetin (ARANESP) injection - DIALYSIS  200 mcg Intravenous Q Tue-HD  . docusate sodium  100 mg Oral BID  . ferric gluconate (FERRLECIT/NULECIT) IV  125 mg Intravenous Daily  . heparin  40 Units/kg Dialysis Once in dialysis  . insulin aspart  0-5 Units Subcutaneous QHS  . insulin aspart  0-9 Units Subcutaneous TID WC  . isosorbide mononitrate  60 mg Oral QHS  . levothyroxine  175 mcg Oral Daily  . multivitamin  1 tablet Oral QHS  . olopatadine  1 drop Both Eyes Daily  . predniSONE  20 mg Oral Q breakfast  . rosuvastatin  10 mg Oral Daily  . sodium chloride  3 mL Intravenous Q12H  . tamsulosin  0.4 mg Oral Daily  . Warfarin - Pharmacist Dosing Inpatient   Does not apply q1800   Continuous Infusions:   Time spent: 30 minutes   Author: Lynden Oxford, MD [facility] Pager: [phone] 09/08/2015 12:51 PM  If 7PM-7AM, please contact night-coverage at [email or web address], password [facility]

## 2015-09-08 NOTE — Progress Notes (Signed)
Subjective: Interval History: has complaints stom dyscomfort last pm and ongoing.  Objective: Vital signs in last 24 hours: Temp:  [97.5 F (36.4 C)-98.6 F (37 C)] 98 F (36.7 C) (11/23 0655) Pulse Rate:  [56-84] 63 (11/23 0900) Resp:  [12-25] 21 (11/23 0900) BP: (121-163)/(65-87) 143/73 mmHg (11/23 0900) SpO2:  [91 %-100 %] 93 % (11/23 0448) Weight:  [84.6 kg (186 lb 8.2 oz)-89.3 kg (196 lb 13.9 oz)] 84.6 kg (186 lb 8.2 oz) (11/23 0448) Weight change: 0.1 kg (3.5 oz)  Intake/Output from previous day: 11/22 0701 - 11/23 0700 In: 470 [P.O.:360; IV Piggyback:110] Out: 3603 [Urine:600] Intake/Output this shift:    General appearance: cooperative, no distress, moderately obese and slowed mentation Resp: diminished breath sounds bilaterally and rales bibasilar Chest wall: RIJ cath Cardio: S1, S2 normal and systolic murmur: holosystolic 2/6, blowing at apex GI: mod distension, pos bs, mild tender Extremities: edema 3+  Lab Results:  Recent Labs  09/07/15 2017 09/08/15 0413  WBC 5.7 5.6  HGB 8.8* 8.5*  HCT 27.2* 26.4*  PLT 68* 59*   BMET:  Recent Labs  09/07/15 2017 09/08/15 0413  NA 137 137  K 4.4 4.3  CL 104 105  CO2 21* 21*  GLUCOSE 300* 219*  BUN 95* 95*  CREATININE 4.93* 4.96*  CALCIUM 8.4* 8.5*    Recent Labs  09/06/15 1005  PTH 154*   Iron Studies:  Recent Labs  09/06/15 0348  IRON 16*  TIBC 266    Studies/Results: Ir Fluoro Guide Cv Line Right  09/07/2015  INDICATION: 79 year old male with a end-stage renal disease now requiring hemodialysis. EXAM: TUNNELED CENTRAL VENOUS HEMODIALYSIS CATHETER PLACEMENT WITH ULTRASOUND AND FLUOROSCOPIC GUIDANCE MEDICATIONS: Ancef 2 gm IV; The IV antibiotic was given in an appropriate time interval prior to skin puncture. CONTRAST:  None ANESTHESIA/SEDATION: Versed 1 mg IV; Fentanyl 50 mcg IV Total Moderate Sedation Time Twenty minutes. FLUOROSCOPY TIME:  54 seconds for a total of 59.2 mGy COMPLICATIONS: None  immediate PROCEDURE: Informed written consent was obtained from the patient after a discussion of the risks, benefits, and alternatives to treatment. Questions regarding the procedure were encouraged and answered. The right neck and chest were prepped with chlorhexidine in a sterile fashion, and a sterile drape was applied covering the operative field. Maximum barrier sterile technique with sterile gowns and gloves were used for the procedure. A timeout was performed prior to the initiation of the procedure. After creating a small venotomy incision, a micropuncture kit was utilized to access the right internal jugular vein under direct, real-time ultrasound guidance after the overlying soft tissues were anesthetized with 1% lidocaine with epinephrine. Ultrasound image documentation was performed. The microwire was kinked to measure appropriate catheter length. A stiff glidewire was advanced to the level of the IVC and the micropuncture sheath was exchanged for a peel-away sheath. A Palindrome tunneled hemodialysis catheter measuring 28 cm from tip to cuff was tunneled in a retrograde fashion from the anterior chest wall to the venotomy incision. The catheter was then placed through the peel-away sheath with tips ultimately positioned within the mid right atrium. Final catheter positioning was confirmed and documented with a spot radiographic image. The catheter aspirates and flushes normally. The catheter was flushed with appropriate volume heparin dwells. The catheter exit site was secured with a 0-Prolene retention suture. The venotomy incision was closed with an interrupted 4-0 Vicryl, and the epidermis sealed with Dermabond. Dressings were applied. The patient tolerated the procedure well without immediate post procedural  complication. IMPRESSION: Successful placement of 28 cm tip to cuff tunneled hemodialysis catheter via the right internal jugular vein with tips terminating within the mid right atrium. The  catheter is ready for immediate use. Signed, Criselda Peaches, MD [facility] Electronically Signed   By: Jacqulynn Cadet M.D.   On: 09/07/2015 10:29   Ir US Guide Vasc Access Right  09/07/2015  INDICATION: 79 year old male with a end-stage renal disease now requiring hemodialysis. EXAM: TUNNELED CENTRAL VENOUS HEMODIALYSIS CATHETER PLACEMENT WITH ULTRASOUND AND FLUOROSCOPIC GUIDANCE MEDICATIONS: Ancef 2 gm IV; The IV antibiotic was given in an appropriate time interval prior to skin puncture. CONTRAST:  None ANESTHESIA/SEDATION: Versed 1 mg IV; Fentanyl 50 mcg IV Total Moderate Sedation Time Twenty minutes. FLUOROSCOPY TIME:  54 seconds for a total of 90.3 mGy COMPLICATIONS: None immediate PROCEDURE: Informed written consent was obtained from the patient after a discussion of the risks, benefits, and alternatives to treatment. Questions regarding the procedure were encouraged and answered. The right neck and chest were prepped with chlorhexidine in a sterile fashion, and a sterile drape was applied covering the operative field. Maximum barrier sterile technique with sterile gowns and gloves were used for the procedure. A timeout was performed prior to the initiation of the procedure. After creating a small venotomy incision, a micropuncture kit was utilized to access the right internal jugular vein under direct, real-time ultrasound guidance after the overlying soft tissues were anesthetized with 1% lidocaine with epinephrine. Ultrasound image documentation was performed. The microwire was kinked to measure appropriate catheter length. A stiff glidewire was advanced to the level of the IVC and the micropuncture sheath was exchanged for a peel-away sheath. A Palindrome tunneled hemodialysis catheter measuring 28 cm from tip to cuff was tunneled in a retrograde fashion from the anterior chest wall to the venotomy incision. The catheter was then  placed through the peel-away sheath with tips ultimately positioned within the mid right atrium. Final catheter positioning was confirmed and documented with a spot radiographic image. The catheter aspirates and flushes normally. The catheter was flushed with appropriate volume heparin dwells. The catheter exit site was secured with a 0-Prolene retention suture. The venotomy incision was closed with an interrupted 4-0 Vicryl, and the epidermis sealed with Dermabond. Dressings were applied. The patient tolerated the procedure well without immediate post procedural complication. IMPRESSION: Successful placement of 28 cm tip to cuff tunneled hemodialysis catheter via the right internal jugular vein with tips terminating within the mid right atrium. The catheter is ready for immediate use. Signed, Criselda Peaches, MD [facility] Electronically Signed   By: Jacqulynn Cadet M.D.   On: 09/07/2015 10:29    I have reviewed the patient's current medications.  Assessment/Plan: 1 ESRD some vol xs, solute .  2nd HD , for access on Fri 2 DM controlled 3 Anemia on esa,/Fe 4 HPTH vit D 5 CAD 6 CM stable 7 Abdm not clear what issues are, follow, primary to address also P HD, esa, Fe , perm access    LOS: 4 days   Nataya Bastedo L 09/08/2015,9:26 AM

## 2015-09-08 NOTE — Progress Notes (Signed)
Patient is active with [facility] for HHC services as prior to admission for HHRN/ PT/ OT /nurses adie. B Regla Fitzgibbon RN,MHA,BSN [phone] 

## 2015-09-08 NOTE — Progress Notes (Signed)
Daughter declines SNF option and plans to take patient home when medically stable.  Unit RNCM is aware of above. CSW services will sign off but available should d/c plan change to SNF.  Lorri Frederickonna T. Jaci LazierCrowder, KentuckyLCSW [phone]

## 2015-09-08 NOTE — Progress Notes (Signed)
ANTICOAGULATION CONSULT NOTE - Follow Up Consult  Pharmacy Consult for coumadin Indication: atrial fibrillation  Allergies  Allergen Reactions  . Tomato Other (See Comments)    Too much acid - cannot take due to kidney issues    Patient Measurements: Height: 5\' 9"  (175.3 cm) Weight: 186 lb 8.2 oz (84.6 kg) IBW/kg (Calculated) : 70.7  Vital Signs: Temp: 98.3 F (36.8 C) (11/23 1005) Temp Source: Oral (11/23 1005) BP: 155/80 mmHg (11/23 1005) Pulse Rate: 70 (11/23 1005)  Labs:  Recent Labs  09/07/15 0322 09/07/15 1201 09/07/15 2017 09/08/15 0413  HGB 8.5*  --  8.8* 8.5*  HCT 27.4*  --  27.2* 26.4*  PLT 148*  --  68* 59*  LABPROT 20.2* 19.0*  --  18.9*  INR 1.73* 1.59*  --  1.58*  CREATININE 7.00*  --  4.93* 4.96*    Estimated Creatinine Clearance: 12.5 mL/min (by C-G formula based on Cr of 4.96).   Medications:  Scheduled:  . allopurinol  100 mg Oral Daily  . amoxicillin-clavulanate  1 tablet Oral Q24H  . calcitRIOL  0.25 mcg Oral Q48H   And  . calcitRIOL  0.5 mcg Oral Q48H  . calcium acetate  667 mg Oral TID WC  . [START ON 09/14/2015] darbepoetin (ARANESP) injection - DIALYSIS  200 mcg Intravenous Q Tue-HD  . docusate sodium  100 mg Oral BID  . ferric gluconate (FERRLECIT/NULECIT) IV  125 mg Intravenous Daily  . heparin  40 Units/kg Dialysis Once in dialysis  . insulin aspart  0-5 Units Subcutaneous QHS  . insulin aspart  0-9 Units Subcutaneous TID WC  . isosorbide mononitrate  60 mg Oral QHS  . levothyroxine  175 mcg Oral Daily  . multivitamin  1 tablet Oral QHS  . olopatadine  1 drop Both Eyes Daily  . predniSONE  20 mg Oral Q breakfast  . rosuvastatin  10 mg Oral Daily  . sodium chloride  3 mL Intravenous Q12H  . tamsulosin  0.4 mg Oral Daily  . traMADol      . Warfarin - Pharmacist Dosing Inpatient   Does not apply q1800    Assessment: 79 yo male with acute on chronic renal failure.  He is on coumadin PTA for afib. He is noted with new ESRD to  start HD and s/p HD cath placement 11/22 (7.5mg  IV vitamin K and FFP given) and for possible fistula placement on Friday (coumadin on hold until after procedure) -INR= 1.58, hg= 8.5, plt= 59 (trend down). Heparin sq 11/18>>11/19.   PTA Coumadin dose: 5 mg on Sun, Tues, Thurs and 2.5 mg on MWF and Saturday.   Goal of Therapy:  INR 2-3 Monitor platelets by anticoagulation protocol: Yes   Plan:  -Hold coumadin today -Will follow plans post procedure -Daily PT/INR  Harland GermanAndrew Tyrees Chopin, Pharm D 09/08/2015 10:42 AM

## 2015-09-08 NOTE — Progress Notes (Addendum)
ANTICOAGULATION CONSULT NOTE - Follow Up Consult  Pharmacy Consult for argatroban Indication: r/o HIT  Allergies  Allergen Reactions  . Tomato Other (See Comments)    Too much acid - cannot take due to kidney issues    Patient Measurements: Height: 5\' 9"  (175.3 cm) Weight: 186 lb 8.2 oz (84.6 kg) IBW/kg (Calculated) : 70.7  Vital Signs: Temp: 98.3 F (36.8 C) (11/23 1005) Temp Source: Oral (11/23 1005) BP: 140/75 mmHg (11/23 1044) Pulse Rate: 71 (11/23 1044)  Labs:  Recent Labs  09/07/15 0322 09/07/15 1201 09/07/15 2017 09/08/15 0413  HGB 8.5*  --  8.8* 8.5*  HCT 27.4*  --  27.2* 26.4*  PLT 148*  --  68* 59*  LABPROT 20.2* 19.0*  --  18.9*  INR 1.73* 1.59*  --  1.58*  CREATININE 7.00*  --  4.93* 4.96*    Estimated Creatinine Clearance: 12.5 mL/min (by C-G formula based on Cr of 4.96).   Medications:  Scheduled:  . allopurinol  100 mg Oral Daily  . calcitRIOL  0.25 mcg Oral Q48H   And  . calcitRIOL  0.5 mcg Oral Q48H  . calcium acetate  667 mg Oral TID WC  . [START ON 09/14/2015] darbepoetin (ARANESP) injection - DIALYSIS  200 mcg Intravenous Q Tue-HD  . docusate sodium  100 mg Oral BID  . ferric gluconate (FERRLECIT/NULECIT) IV  125 mg Intravenous Daily  . heparin  40 Units/kg Dialysis Once in dialysis  . insulin aspart  0-5 Units Subcutaneous QHS  . insulin aspart  0-9 Units Subcutaneous TID WC  . isosorbide mononitrate  60 mg Oral QHS  . levothyroxine  175 mcg Oral Daily  . multivitamin  1 tablet Oral QHS  . olopatadine  1 drop Both Eyes Daily  . predniSONE  20 mg Oral Q breakfast  . rosuvastatin  10 mg Oral Daily  . sodium chloride  3 mL Intravenous Q12H  . sodium chloride  3 mL Intravenous Q12H  . tamsulosin  0.4 mg Oral Daily  . Warfarin - Pharmacist Dosing Inpatient   Does not apply q1800    Assessment: 79 yo male with ESRD and new HD start. He is now noted with concern for HIT and pharmacy consulted to dose argatroban for r/o HIT. I have also  contacted renal and made them aware of the initiation of argatroban.   -Noted on heparin sq 11/18-11/19, heparin also given in HD 11/22 -Platelet count 59 with trend down. Platelet count has been 112-148 this admit (112 on first day of heparin sq) -AST/ALT= 50/39, INR= 1.58  11/23: HIT antibody ordered  Goal of Therapy:  APTT 50-90 Monitor platelets by anticoagulation protocol: Yes   Plan:  -Begin argatroban at 891mcg/kg/min -aPTT in 2 hours and daily -Will follow HIT antibody results  Harland GermanAndrew Rosa Wyly, Pharm D 09/08/2015 1:29 PM

## 2015-09-09 LAB — RENAL FUNCTION PANEL
Albumin: 2.5 g/dL — ABNORMAL LOW (ref 3.5–5.0)
Anion gap: 9 (ref 5–15)
BUN: 75 mg/dL — ABNORMAL HIGH (ref 6–20)
CHLORIDE: 102 mmol/L (ref 101–111)
CO2: 26 mmol/L (ref 22–32)
Calcium: 8.7 mg/dL — ABNORMAL LOW (ref 8.9–10.3)
Creatinine, Ser: 4 mg/dL — ABNORMAL HIGH (ref 0.61–1.24)
GFR, EST AFRICAN AMERICAN: 15 mL/min — AB (ref >60)
GFR, EST NON AFRICAN AMERICAN: 13 mL/min — AB (ref >60)
Glucose, Bld: 300 mg/dL — ABNORMAL HIGH (ref 65–99)
POTASSIUM: 4.8 mmol/L (ref 3.5–5.1)
Phosphorus: 4.8 mg/dL — ABNORMAL HIGH (ref 2.5–4.6)
Sodium: 137 mmol/L (ref 135–145)

## 2015-09-09 LAB — CBC
HEMATOCRIT: 27.6 % — AB (ref 39.0–52.0)
Hemoglobin: 8.7 g/dL — ABNORMAL LOW (ref 13.0–17.0)
MCH: 29.7 pg (ref 26.0–34.0)
MCHC: 31.5 g/dL (ref 30.0–36.0)
MCV: 94.2 fL (ref 78.0–100.0)
Platelets: 59 10*3/uL — ABNORMAL LOW (ref 150–400)
RBC: 2.93 MIL/uL — AB (ref 4.22–5.81)
RDW: 14.7 % (ref 11.5–15.5)
WBC: 6.6 10*3/uL (ref 4.0–10.5)

## 2015-09-09 LAB — APTT
APTT: 117 s — AB (ref 24–37)
aPTT: 98 seconds — ABNORMAL HIGH (ref 24–37)
aPTT: 96 s — ABNORMAL HIGH (ref 24–37)
aPTT: 88 seconds — ABNORMAL HIGH (ref 24–37)

## 2015-09-09 LAB — GLUCOSE, CAPILLARY
GLUCOSE-CAPILLARY: 227 mg/dL — AB (ref 65–99)
Glucose-Capillary: 199 mg/dL — ABNORMAL HIGH (ref 65–99)
Glucose-Capillary: 340 mg/dL — ABNORMAL HIGH (ref 65–99)
Glucose-Capillary: 286 mg/dL — ABNORMAL HIGH (ref 65–99)

## 2015-09-09 LAB — PROTIME-INR
INR: 2.75 — ABNORMAL HIGH (ref 0.00–1.49)
Prothrombin Time: 28.6 seconds — ABNORMAL HIGH (ref 11.6–15.2)

## 2015-09-09 LAB — HEPARIN INDUCED PLATELET AB (HIT ANTIBODY): HEPARIN INDUCED PLT AB: 0.509 {OD_unit} — AB (ref 0.000–0.400)

## 2015-09-09 MED ORDER — ARGATROBAN 50 MG/50ML IV SOLN: 0.7000 ug/kg/min | INTRAVENOUS | Status: DC

## 2015-09-09 MED ORDER — SODIUM CHLORIDE 0.9 % IV SOLN: 100.0000 mL | INTRAVENOUS | Status: DC | PRN

## 2015-09-09 MED ORDER — CARVEDILOL 12.5 MG PO TABS
12.5000 mg | ORAL_TABLET | Freq: Two times a day (BID) | ORAL | Status: DC
  Administered 2015-09-09: 12.5 mg via ORAL
  Filled 2015-09-09: qty 1

## 2015-09-09 MED ORDER — AMLODIPINE BESYLATE 10 MG PO TABS: 10.0000 mg | ORAL_TABLET | Freq: Every day | ORAL | Status: DC

## 2015-09-09 MED ORDER — PENTAFLUOROPROP-TETRAFLUOROETH EX AERO
1.0000 | INHALATION_SPRAY | CUTANEOUS | Status: DC | PRN
Start: 2015-09-09 — End: 2015-09-13

## 2015-09-09 MED ORDER — HEPARIN SODIUM (PORCINE) 1000 UNIT/ML DIALYSIS: 1000.0000 [IU] | INTRAMUSCULAR | Status: DC | PRN

## 2015-09-09 MED ORDER — ALTEPLASE 2 MG IJ SOLR
2.0000 mg | Freq: Once | INTRAMUSCULAR | Status: DC | PRN
  Filled 2015-09-09: qty 2

## 2015-09-09 MED ORDER — ARGATROBAN 50 MG/50ML IV SOLN
0.4000 ug/kg/min | INTRAVENOUS | Status: DC
  Administered 2015-09-09: 0.4 ug/kg/min via INTRAVENOUS
  Filled 2015-09-09: qty 50

## 2015-09-09 MED ORDER — CARVEDILOL 12.5 MG PO TABS
12.5000 mg | ORAL_TABLET | Freq: Two times a day (BID) | ORAL | Status: DC
  Administered 2015-09-10 – 2015-09-12 (×4): 12.5 mg via ORAL
  Filled 2015-09-09 (×4): qty 1

## 2015-09-09 MED ORDER — LIDOCAINE-PRILOCAINE 2.5-2.5 % EX CREA
1.0000 "application " | TOPICAL_CREAM | CUTANEOUS | Status: DC | PRN
  Filled 2015-09-09: qty 5

## 2015-09-09 MED ORDER — LIDOCAINE HCL (PF) 1 % IJ SOLN: 5.0000 mL | INTRAMUSCULAR | Status: DC | PRN

## 2015-09-09 NOTE — Progress Notes (Signed)
ANTICOAGULATION CONSULT NOTE - Follow Up Consult  Pharmacy Consult for argatroban Indication: r/o HIT  Labs:  Recent Labs  09/07/15 1201 09/07/15 2017 09/08/15 0413  09/09/15 0227 09/09/15 1230 09/09/15 1535  HGB  --  8.8* 8.5*  --  8.7*  --   --   HCT  --  27.2* 26.4*  --  27.6*  --   --   PLT  --  68* 59*  --  59*  --   --   APTT  --   --   --   < > 117* 98* 96*  LABPROT 19.0*  --  18.9*  --  28.6*  --   --   INR 1.59*  --  1.58*  --  2.75*  --   --   CREATININE  --  4.93* 4.96*  --  4.00*  --   --   < > = values in this interval not displayed.  Estimated Creatinine Clearance: 15.5 mL/min (by C-G formula based on Cr of 4).  Assessment: 79 yo male with ESRD and new HD start. He is now noted with concern for HIT and pharmacy consulted to dose argatroban for r/o HIT. I have also contacted renal and made them aware of the initiation of argatroban.   11/23: HIT antibody - pending   Goal of Therapy:  APTT 50-90 Monitor platelets by anticoagulation protocol: Yes   Plan:   Decrease Argatroban to 0.614mcg/kg/min aPTT in 4 hours to ensure distribution as level was drawn early with last decrease. Will follow HIT antibody results  Nadara MustardNita Desa Rech, PharmD., MS Clinical Pharmacist Pager:  [phone] Thank you for allowing pharmacy to be part of this patients care team. 09/09/2015 4:41 PM

## 2015-09-09 NOTE — Progress Notes (Addendum)
ANTICOAGULATION CONSULT NOTE - Follow Up Consult  Pharmacy Consult for argatroban Indication: r/o HIT  Allergies  Allergen Reactions  . Tomato Other (See Comments)    Too much acid - cannot take due to kidney issues    Patient Measurements: Height: 5\' 9"  (175.3 cm) Weight: 178 lb 9.2 oz (81 kg) IBW/kg (Calculated) : 70.7  Vital Signs: Temp: 98.3 F (36.8 C) (11/24 0533) Temp Source: Oral (11/24 0533) BP: 152/68 mmHg (11/24 0533) Pulse Rate: 68 (11/24 0533)  Labs:  Recent Labs  09/07/15 1201 09/07/15 2017 09/08/15 0413 09/08/15 1630 09/08/15 1854 09/09/15 0227  HGB  --  8.8* 8.5*  --   --  8.7*  HCT  --  27.2* 26.4*  --   --  27.6*  PLT  --  68* 59*  --   --  59*  APTT  --   --   --  51* 52* 117*  LABPROT 19.0*  --  18.9*  --   --  28.6*  INR 1.59*  --  1.58*  --   --  2.75*  CREATININE  --  4.93* 4.96*  --   --  4.00*    Estimated Creatinine Clearance: 15.5 mL/min (by C-G formula based on Cr of 4).   Medications:  Scheduled:  . calcitRIOL  0.25 mcg Oral Q48H   And  . calcitRIOL  0.5 mcg Oral Q48H  . calcium acetate  667 mg Oral TID WC  . [START ON 09/14/2015] darbepoetin (ARANESP) injection - DIALYSIS  200 mcg Intravenous Q Tue-HD  . docusate sodium  100 mg Oral BID  . ferric gluconate (FERRLECIT/NULECIT) IV  125 mg Intravenous Daily  . insulin aspart  0-5 Units Subcutaneous QHS  . insulin aspart  0-9 Units Subcutaneous TID WC  . isosorbide mononitrate  60 mg Oral QHS  . levothyroxine  175 mcg Oral Daily  . multivitamin  1 tablet Oral QHS  . olopatadine  1 drop Both Eyes Daily  . predniSONE  20 mg Oral Q breakfast  . sodium chloride  3 mL Intravenous Q12H  . sodium chloride  3 mL Intravenous Q12H  . tamsulosin  0.4 mg Oral Daily  . Warfarin - Pharmacist Dosing Inpatient   Does not apply q1800    Assessment: 79 yo male with ESRD and new HD start. He is now noted with concern for HIT and pharmacy consulted to dose argatroban for r/o HIT. I have also  contacted renal and made them aware of the initiation of argatroban.   -Noted on heparin sq 11/18-11/19, heparin also given in HD 11/22 -Platelet count 59 with trend down. Platelet count has been 112-148 this admit (112 on first day of heparin sq) -AST/ALT= 50/39, INR= 2.75  11/23: HIT antibody - pending  PTT up to 117 today so we'll adjust dose.   Goal of Therapy:  APTT 50-90 Monitor platelets by anticoagulation protocol: Yes   Plan:   Decrease Argatroban to 0.407mcg/kg/min aPTT in 2 hours and daily Will follow HIT antibody results  Ulyses SouthwardMinh Pham, PharmD Pager: [phone] 09/09/2015 10:21 AM   Addendum  PTT still came back elevated at 98. Going to reduce rate again.  - Decrease argatroban to 0.395mcg/kg/min -Recheck 2 hr PTT again  Ulyses SouthwardMinh Pham, PharmD Pager: [phone] 09/09/2015 1:44 PM

## 2015-09-09 NOTE — Progress Notes (Signed)
[facility] Progress Note    Patient: [name] [name]    QIO:[id number]  DOB: Jul 24, 1936     DOA: 09/03/2015 Date of Service: the patient was seen and examined on 09/09/2015  Brief Summary of Hospitalization:   79 y.o. ?  atrial fibrillation on Coumadin,  Diabetes ty ii,  hypertension , coronary artery disease+ ischemic cardiomyopathy and chronic kidney disease.  discharged from the hospital 11/16 three-day stay for HCAP and acute on chronic combined heart failure.  Patient was discharged home on Augmentin.   Patient really has not been out of bed since hospital discharge.  Found to have toxic metabolic encephalopathy 2/2 to med and rising creatinine Eventually started dialysis 11/22  Consultants: Nephrology  Procedures: [facility] placement, hemodialysis Antibiotics: Augmentin 09/01/2015  start date end date 09/08/2015   SUBJ Doing ok Alert oriented in NAd tol diet No cp no SOB No fever nor chills   Assessment and Plan: 1. Acute on chronic kidney failure [facility]) Patient presents with complaints of worsening renal function with confusion. Nephrology was consulted and recommend to start hemodialysis as well as Lasix. The patient underwent [facility]  Placement 11/22 Vascular surgery was also consulted for a possible fistula placement on 09/10/15 hold warfarin until that procedure-On Argatroban Once access and fistula placed can consider Clip PLT counts above 50-so should be able to perform procedure in am  2. Thrombocytopenia first noticed on 11/22 Would alert pharmacy to this and consider argatroban Send off HIT panel Could also be 2/2 Allopurinol and Crestor-both which have been d/c'd Will send off Platelet smear as well  2. Metabolic acidosis Resolved with hemodialysis.  3. Gout. Continue with prednisone.  improving significantly. Start on low-dose tramadol. Allopurinol d/c 11/23 Most likely with improvement with hemodialysis  4. acute encephalopathy. Patient  initially presented with confusion and acute encephalopathy which was thought to be secondary to narcotics. 2/2 to  nephropathy secondary to uremia.  5  Hypothyroidism TSH within normal limit. 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 Sugars 199-217  7  Essential hypertension Slightly ?  blood pressure. Added back oreg 12.5 bid  8  Atrial fibrillation (HCC) Currently rate controlled on  INR was reversed for [facility] placement. Will continue to hold warfarin for vascular graft procedure scheduled on Friday.  9  HCAP (healthcare-associated pneumonia) Patient had pneumonia last admission which is currently improving. Continuing Augmentin to complete the course. Last dose 09/08/2015  DVT Prophylaxis: SCD Nutrition: Renal diet   Advance goals of care discussion: Full code  CALLED [phone]  Disposition:  Expected discharge date: 09/09/2015  Barriers to safe discharge: ACCESS PLACEMENT, tcp   Intake/Output Summary (Last 24 hours) at 09/09/15 1414 Last data filed at 09/09/15 0033  Gross per 24 hour  Intake    350 ml  Output    450 ml  Net   -100 ml   Filed Weights   09/08/15 0448 09/08/15 1053 09/09/15 0533  Weight: 84.6 kg (186 lb 8.2 oz) 81.8 kg (180 lb 5.4 oz) 81 kg (178 lb 9.2 oz)    Objective: Physical Exam: Filed Vitals:   09/08/15 2142 09/09/15 0033 09/09/15 0533 09/09/15 1150  BP: 154/74 150/61 152/68 143/75  Pulse: 73 70 68 143  Temp: 98.5 F (36.9 C) 98.3 F (36.8 C) 98.3 F (36.8 C) 97.6 F (36.4 C)  TempSrc: Oral Oral Oral Oral  Resp: 19 18 19  Height:      Weight:   81 kg (178 lb 9.2 oz)   SpO2: 93% 98% 99% 94%   General: NAD Oral Mucosa moist. Cardiovascular: S1 and S2 Present, no Murmur, JVD flat Respiratory: Bilateral Air entry present and  basal Crackles, no wheezes Abdomen: sfot nt nd Extremities:  Bilateral  Pedal edema, no calf tenderness  Data Reviewed: CBC:  Recent Labs Lab 09/03/15 1356  09/06/15 0348 09/07/15 0322 09/07/15 2017 09/08/15 0413 09/09/15 0227  WBC 5.9  < > 6.4 4.9 5.7 5.6 6.6  NEUTROABS 3.8  --  5.0  --   --   --   --   HGB 9.2*  < > 8.9* 8.5* 8.8* 8.5* 8.7*  HCT 28.7*  < > 27.6* 27.4* 27.2* 26.4* 27.6*  MCV 92.9  < > 92.0 92.9 91.3 92.3 94.2  PLT 118*  < > 144* 148* 68* 59* 59*  < > = values in this interval not displayed. Basic Metabolic Panel:  Recent Labs Lab 09/06/15 0348 09/07/15 0322 09/07/15 2017 09/08/15 0413 09/09/15 0227  NA 134* 136 137 137 137  K 5.5* 5.5* 4.4 4.3 4.8  CL 106 107 104 105 102  CO2 15* 17* 21* 21* 26  GLUCOSE 236* 243* 300* 219* 300*  BUN 130* 141* 95* 95* 75*  CREATININE 6.80* 7.00* 4.93* 4.96* 4.00*  CALCIUM 8.7* 8.8* 8.4* 8.5* 8.7*  MG  --  2.2  --   --   --   PHOS  --   --  5.5* 5.3* 4.8*   Liver Function Tests:  Recent Labs Lab 09/04/15 0345 09/05/15 0156 09/06/15 0348 09/07/15 2017 09/08/15 0413 09/09/15 0227  AST 57* 55* 50*  --   --   --   ALT 34 34 39  --   --   --   ALKPHOS 57 57 55  --   --   --   BILITOT 0.4 0.6 0.5  --   --   --   PROT 5.6* 6.1* 6.2*  --   --   --   ALBUMIN 2.5* 2.4* 2.6* 2.7* 2.5* 2.5*   No results for input(s): LIPASE, AMYLASE in the last 168 hours. No results for input(s): AMMONIA in the last 168 hours.  Cardiac Enzymes:  Recent Labs Lab 09/04/15 1250  TROPONINI <0.03   BNP (last 3 results)  Recent Labs  12/10/14 0530 08/29/15 1235 09/03/15 1356  BNP 326.5* 1483.1* 1191.8*    ProBNP (last 3 results) No results for input(s): PROBNP in the last 8760 hours.   CBG:  Recent Labs Lab 09/08/15 2028 09/08/15 2141 09/08/15 2322 09/09/15 0626 09/09/15 1140  GLUCAP 332* 316* 315* 227* 199*    Recent Results (from the past 240 hour(s))  MRSA PCR Screening     Status: None   Collection Time: 09/01/15  9:06 AM  Result Value Ref Range Status   MRSA by  PCR NEGATIVE NEGATIVE Final    Comment:        The GeneXpert MRSA Assay (FDA approved for NASAL specimens only), is one component of a comprehensive MRSA colonization surveillance program. It is not intended to diagnose MRSA infection nor to guide or monitor treatment for MRSA infections.      Studies: No results found.   Scheduled Meds: . calcitRIOL  0.25 mcg Oral Q48H   And  . calcitRIOL  0.5 mcg Oral Q48H  . calcium acetate  667 mg Oral TID WC  . [START ON 09/14/2015] darbepoetin (  ARANESP) injection - DIALYSIS  200 mcg Intravenous Q Tue-HD  . docusate sodium  100 mg Oral BID  . ferric gluconate (FERRLECIT/NULECIT) IV  125 mg Intravenous Daily  . insulin aspart  0-5 Units Subcutaneous QHS  . insulin aspart  0-9 Units Subcutaneous TID WC  . isosorbide mononitrate  60 mg Oral QHS  . levothyroxine  175 mcg Oral Daily  . multivitamin  1 tablet Oral QHS  . olopatadine  1 drop Both Eyes Daily  . predniSONE  20 mg Oral Q breakfast  . sodium chloride  3 mL Intravenous Q12H  . sodium chloride  3 mL Intravenous Q12H  . tamsulosin  0.4 mg Oral Daily  . Warfarin - Pharmacist Dosing Inpatient   Does not apply q1800   Continuous Infusions: . argatroban 0.5 mcg/kg/min (09/09/15 1403)    Time spent: 30 minutes   Pleas Koch, MD [facility] [phone]

## 2015-09-09 NOTE — Progress Notes (Signed)
ANTICOAGULATION CONSULT NOTE - Follow Up Consult  Pharmacy Consult for argatroban Indication: r/o HIT  Labs:  Recent Labs  09/07/15 1201 09/07/15 2017 09/08/15 0413  09/09/15 0227 09/09/15 1230 09/09/15 1535 09/09/15 1936  HGB  --  8.8* 8.5*  --  8.7*  --   --   --   HCT  --  27.2* 26.4*  --  27.6*  --   --   --   PLT  --  68* 59*  --  59*  --   --   --   APTT  --   --   --   < > 117* 98* 96* 88*  LABPROT 19.0*  --  18.9*  --  28.6*  --   --   --   INR 1.59*  --  1.58*  --  2.75*  --   --   --   CREATININE  --  4.93* 4.96*  --  4.00*  --   --   --   < > = values in this interval not displayed.  Estimated Creatinine Clearance: 15.5 mL/min (by C-G formula based on Cr of 4).  Assessment: 79 yo male with ESRD and new HD start. He is now noted with concern for HIT and pharmacy consulted to dose argatroban for r/o HIT. I have also contacted renal and made them aware of the initiation of argatroban. His aPTT this evening is 88 seconds and within desired goal range.  No noted bleeding complications.  11/23: HIT antibody - pending   Goal of Therapy:  APTT 50-90 Monitor platelets by anticoagulation protocol: Yes   Plan:  Continue Argatroban to 0.554mcg/kg/min aPTT in the morning Will follow HIT antibody results  Nadara MustardNita Tawona Filsinger, PharmD., MS Clinical Pharmacist Pager:  [phone] Thank you for allowing pharmacy to be part of this patients care team. 09/09/2015 8:06 PM

## 2015-09-09 NOTE — Progress Notes (Signed)
Subjective: Interval History: has no complaint, feels a lot better.  Objective: Vital signs in last 24 hours: Temp:  [98.3 F (36.8 C)-98.5 F (36.9 C)] 98.3 F (36.8 C) (11/24 0533) Pulse Rate:  [68-73] 68 (11/24 0533) Resp:  [16-19] 19 (11/24 0533) BP: (140-155)/(61-80) 152/68 mmHg (11/24 0533) SpO2:  [93 %-99 %] 99 % (11/24 0533) Weight:  [81 kg (178 lb 9.2 oz)-81.8 kg (180 lb 5.4 oz)] 81 kg (178 lb 9.2 oz) (11/24 0533) Weight change: -7.5 kg (-16 lb 8.6 oz)  Intake/Output from previous day: 11/23 0701 - 11/24 0700 In: 710 [P.O.:600; IV Piggyback:110] Out: 2747 [Urine:450] Intake/Output this shift:    General appearance: alert, cooperative, no distress and mildly obese Resp: diminished breath sounds bilaterally Chest wall: R IJ PC Cardio: S1, S2 normal and systolic murmur: holosystolic 2/6, blowing at apex GI: pos bs, soft, liver down 6 cm Extremities: edema 2+  Lab Results:  Recent Labs  09/08/15 0413 09/09/15 0227  WBC 5.6 6.6  HGB 8.5* 8.7*  HCT 26.4* 27.6*  PLT 59* 59*   BMET:  Recent Labs  09/08/15 0413 09/09/15 0227  NA 137 137  K 4.3 4.8  CL 105 102  CO2 21* 26  GLUCOSE 219* 300*  BUN 95* 75*  CREATININE 4.96* 4.00*  CALCIUM 8.5* 8.7*    Recent Labs  09/06/15 1005  PTH 154*   Iron Studies: No results for input(s): IRON, TIBC, TRANSFERRIN, FERRITIN in the last 72 hours.  Studies/Results: Ir Fluoro Guide Cv Line Right  09/07/2015  INDICATION: 79 year old male with a end-stage renal disease now requiring hemodialysis. EXAM: TUNNELED CENTRAL VENOUS HEMODIALYSIS CATHETER PLACEMENT WITH ULTRASOUND AND FLUOROSCOPIC GUIDANCE MEDICATIONS: Ancef 2 gm IV; The IV antibiotic was given in an appropriate time interval prior to skin puncture. CONTRAST:  None ANESTHESIA/SEDATION: Versed 1 mg IV; Fentanyl 50 mcg IV Total Moderate Sedation Time Twenty minutes. FLUOROSCOPY TIME:  54 seconds for a total of 24.0 mGy COMPLICATIONS: None immediate PROCEDURE:  Informed written consent was obtained from the patient after a discussion of the risks, benefits, and alternatives to treatment. Questions regarding the procedure were encouraged and answered. The right neck and chest were prepped with chlorhexidine in a sterile fashion, and a sterile drape was applied covering the operative field. Maximum barrier sterile technique with sterile gowns and gloves were used for the procedure. A timeout was performed prior to the initiation of the procedure. After creating a small venotomy incision, a micropuncture kit was utilized to access the right internal jugular vein under direct, real-time ultrasound guidance after the overlying soft tissues were anesthetized with 1% lidocaine with epinephrine. Ultrasound image documentation was performed. The microwire was kinked to measure appropriate catheter length. A stiff glidewire was advanced to the level of the IVC and the micropuncture sheath was exchanged for a peel-away sheath. A Palindrome tunneled hemodialysis catheter measuring 28 cm from tip to cuff was tunneled in a retrograde fashion from the anterior chest wall to the venotomy incision. The catheter was then placed through the peel-away sheath with tips ultimately positioned within the mid right atrium. Final catheter positioning was confirmed and documented with a spot radiographic image. The catheter aspirates and flushes normally. The catheter was flushed with appropriate volume heparin dwells. The catheter exit site was secured with a 0-Prolene retention suture. The venotomy incision was closed with an interrupted 4-0 Vicryl, and the epidermis sealed with Dermabond. Dressings were applied. The patient tolerated the procedure well without immediate post procedural complication. IMPRESSION:  Successful placement of 28 cm tip to cuff tunneled hemodialysis catheter via the right internal jugular vein with tips terminating within the mid right atrium. The catheter is ready for  immediate use. Signed, Criselda Peaches, MD [facility] Radiology Electronically Signed   By: Jacqulynn Cadet M.D.   On: 09/07/2015 10:29   Ir US Guide Vasc Access Right  09/07/2015  INDICATION: 79 year old male with a end-stage renal disease now requiring hemodialysis. EXAM: TUNNELED CENTRAL VENOUS HEMODIALYSIS CATHETER PLACEMENT WITH ULTRASOUND AND FLUOROSCOPIC GUIDANCE MEDICATIONS: Ancef 2 gm IV; The IV antibiotic was given in an appropriate time interval prior to skin puncture. CONTRAST:  None ANESTHESIA/SEDATION: Versed 1 mg IV; Fentanyl 50 mcg IV Total Moderate Sedation Time Twenty minutes. FLUOROSCOPY TIME:  54 seconds for a total of 81.1 mGy COMPLICATIONS: None immediate PROCEDURE: Informed written consent was obtained from the patient after a discussion of the risks, benefits, and alternatives to treatment. Questions regarding the procedure were encouraged and answered. The right neck and chest were prepped with chlorhexidine in a sterile fashion, and a sterile drape was applied covering the operative field. Maximum barrier sterile technique with sterile gowns and gloves were used for the procedure. A timeout was performed prior to the initiation of the procedure. After creating a small venotomy incision, a micropuncture kit was utilized to access the right internal jugular vein under direct, real-time ultrasound guidance after the overlying soft tissues were anesthetized with 1% lidocaine with epinephrine. Ultrasound image documentation was performed. The microwire was kinked to measure appropriate catheter length. A stiff glidewire was advanced to the level of the IVC and the micropuncture sheath was exchanged for a peel-away sheath. A Palindrome tunneled hemodialysis catheter measuring 28 cm from tip to cuff was tunneled in a retrograde fashion from the anterior chest wall to the venotomy incision. The catheter was then placed through the  peel-away sheath with tips ultimately positioned within the mid right atrium. Final catheter positioning was confirmed and documented with a spot radiographic image. The catheter aspirates and flushes normally. The catheter was flushed with appropriate volume heparin dwells. The catheter exit site was secured with a 0-Prolene retention suture. The venotomy incision was closed with an interrupted 4-0 Vicryl, and the epidermis sealed with Dermabond. Dressings were applied. The patient tolerated the procedure well without immediate post procedural complication. IMPRESSION: Successful placement of 28 cm tip to cuff tunneled hemodialysis catheter via the right internal jugular vein with tips terminating within the mid right atrium. The catheter is ready for immediate use. Signed, Criselda Peaches, MD [facility] Radiology Electronically Signed   By: Jacqulynn Cadet M.D.   On: 09/07/2015 10:29    I have reviewed the patient's current medications.  Assessment/Plan: 1 ESRD will do 3rd tx in am.  Low platelets may complic access placement for a few days, but will need prior to D/C. Still vol xs 2 anemia on esa/Fe 3 HPTH 4 DM controlled 5 low platelets ?? HIT vs acute illness or drugs.  Stop new agents, ie Allopurinol, rosuvostatin 6 Gout 7 ? HCAP not sure needs Augmentin 8 CVA 9 Afib in past P HD, stop allopurinol, crestor, access soon.    LOS: 5 days   Abner Ardis L 09/09/2015,9:19 AM

## 2015-09-10 ENCOUNTER — Ambulatory Visit (HOSPITAL_COMMUNITY): Admission: RE | Admit: 2015-09-10 | Payer: Medicare Other | Source: Ambulatory Visit | Admitting: Vascular Surgery

## 2015-09-10 ENCOUNTER — Encounter (HOSPITAL_COMMUNITY)
Admission: EM | Disposition: A | Discharge status: Home / Self Care | Payer: Self-pay | Source: Home / Self Care | Attending: Family Medicine

## 2015-09-10 ENCOUNTER — Inpatient Hospital Stay (HOSPITAL_COMMUNITY): Payer: Medicare Other | Admitting: Anesthesiology

## 2015-09-10 HISTORY — PX: AV FISTULA PLACEMENT: SHX1204

## 2015-09-10 LAB — BASIC METABOLIC PANEL
Anion gap: 8 (ref 5–15)
BUN: 85 mg/dL — AB (ref 6–20)
CALCIUM: 9 mg/dL (ref 8.9–10.3)
CO2: 24 mmol/L (ref 22–32)
CREATININE: 4.35 mg/dL — AB (ref 0.61–1.24)
Chloride: 106 mmol/L (ref 101–111)
GFR calc Af Amer: 14 mL/min — ABNORMAL LOW (ref >60)
GFR, EST NON AFRICAN AMERICAN: 12 mL/min — AB (ref >60)
GLUCOSE: 286 mg/dL — AB (ref 65–99)
POTASSIUM: 4.5 mmol/L (ref 3.5–5.1)
SODIUM: 138 mmol/L (ref 135–145)

## 2015-09-10 LAB — CBC
HCT: 26.4 % — ABNORMAL LOW (ref 39.0–52.0)
Hemoglobin: 8.2 g/dL — ABNORMAL LOW (ref 13.0–17.0)
MCH: 29.7 pg (ref 26.0–34.0)
MCHC: 31.1 g/dL (ref 30.0–36.0)
MCV: 95.7 fL (ref 78.0–100.0)
PLATELETS: 65 10*3/uL — AB (ref 150–400)
RBC: 2.76 MIL/uL — ABNORMAL LOW (ref 4.22–5.81)
RDW: 15.1 % (ref 11.5–15.5)
WBC: 7.8 10*3/uL (ref 4.0–10.5)

## 2015-09-10 LAB — GLUCOSE, CAPILLARY
GLUCOSE-CAPILLARY: 203 mg/dL — AB (ref 65–99)
GLUCOSE-CAPILLARY: 148 mg/dL — AB (ref 65–99)
GLUCOSE-CAPILLARY: 147 mg/dL — AB (ref 65–99)
GLUCOSE-CAPILLARY: 202 mg/dL — AB (ref 65–99)
GLUCOSE-CAPILLARY: 346 mg/dL — AB (ref 65–99)

## 2015-09-10 LAB — PROTIME-INR
INR: 2.09 — AB (ref 0.00–1.49)
Prothrombin Time: 23.3 seconds — ABNORMAL HIGH (ref 11.6–15.2)

## 2015-09-10 LAB — APTT: APTT: 86 s — AB (ref 24–37)

## 2015-09-10 LAB — PATHOLOGIST SMEAR REVIEW

## 2015-09-10 SURGERY — ARTERIOVENOUS (AV) FISTULA CREATION
Anesthesia: Monitor Anesthesia Care | Site: Arm Upper | Laterality: Left

## 2015-09-10 MED ORDER — HYDROMORPHONE HCL 1 MG/ML IJ SOLN: 0.2500 mg | INTRAMUSCULAR | Status: DC | PRN

## 2015-09-10 MED ORDER — SODIUM CHLORIDE 0.9 % IV SOLN
INTRAVENOUS | Status: DC | PRN
  Administered 2015-09-10: 500 mL

## 2015-09-10 MED ORDER — PROPOFOL 500 MG/50ML IV EMUL
INTRAVENOUS | Status: DC | PRN
  Administered 2015-09-10: 25 ug/kg/min via INTRAVENOUS

## 2015-09-10 MED ORDER — SODIUM CHLORIDE 0.9 % IV SOLN
INTRAVENOUS | Status: DC
  Administered 2015-09-10: 11:00:00 via INTRAVENOUS

## 2015-09-10 MED ORDER — ARGATROBAN 50 MG/50ML IV SOLN
0.4000 ug/kg/min | INTRAVENOUS | Status: DC
  Filled 2015-09-10: qty 50

## 2015-09-10 MED ORDER — CEFAZOLIN SODIUM-DEXTROSE 2-3 GM-% IV SOLR
2.0000 g | INTRAVENOUS | Status: AC
  Administered 2015-09-10: 2 g via INTRAVENOUS
  Filled 2015-09-10: qty 50

## 2015-09-10 MED ORDER — CEFAZOLIN SODIUM-DEXTROSE 2-3 GM-% IV SOLR
INTRAVENOUS | Status: AC
  Filled 2015-09-10: qty 50

## 2015-09-10 MED ORDER — ALBUTEROL SULFATE HFA 108 (90 BASE) MCG/ACT IN AERS
INHALATION_SPRAY | RESPIRATORY_TRACT | Status: DC | PRN
  Administered 2015-09-10: 2 via RESPIRATORY_TRACT

## 2015-09-10 MED ORDER — LIDOCAINE-EPINEPHRINE (PF) 1 %-1:200000 IJ SOLN
INTRAMUSCULAR | Status: DC | PRN
  Administered 2015-09-10: 10 mL

## 2015-09-10 MED ORDER — 0.9 % SODIUM CHLORIDE (POUR BTL) OPTIME
TOPICAL | Status: DC | PRN
  Administered 2015-09-10: 1000 mL

## 2015-09-10 MED ORDER — PHENYLEPHRINE HCL 10 MG/ML IJ SOLN
INTRAMUSCULAR | Status: DC | PRN
  Administered 2015-09-10 (×2): 200 ug via INTRAVENOUS
  Administered 2015-09-10: 80 ug via INTRAVENOUS

## 2015-09-10 MED ORDER — FENTANYL CITRATE (PF) 100 MCG/2ML IJ SOLN
INTRAMUSCULAR | Status: DC | PRN
  Administered 2015-09-10: 50 ug via INTRAVENOUS

## 2015-09-10 MED ORDER — LIDOCAINE-EPINEPHRINE (PF) 1 %-1:200000 IJ SOLN
INTRAMUSCULAR | Status: AC
  Filled 2015-09-10: qty 30

## 2015-09-10 MED ORDER — PROMETHAZINE HCL 25 MG/ML IJ SOLN: 6.2500 mg | INTRAMUSCULAR | Status: DC | PRN

## 2015-09-10 MED ORDER — HEMOSTATIC AGENTS (NO CHARGE) OPTIME
TOPICAL | Status: DC | PRN
  Administered 2015-09-10: 1 via TOPICAL

## 2015-09-10 MED ORDER — LIDOCAINE HCL (PF) 1 % IJ SOLN
INTRAMUSCULAR | Status: AC
  Filled 2015-09-10: qty 30

## 2015-09-10 SURGICAL SUPPLY — 35 items
ARMBAND PINK RESTRICT EXTREMIT (MISCELLANEOUS) ×2 IMPLANT
CANISTER SUCTION 2500CC (MISCELLANEOUS) ×2 IMPLANT
CLIP TI MEDIUM 6 (CLIP) ×2 IMPLANT
CLIP TI WIDE RED SMALL 6 (CLIP) ×2 IMPLANT
COVER PROBE W GEL 5X96 (DRAPES) IMPLANT
DRAIN PENROSE 1/4X12 LTX STRL (WOUND CARE) ×2 IMPLANT
ELECT REM PT RETURN 9FT ADLT (ELECTROSURGICAL) ×2
ELECTRODE REM PT RTRN 9FT ADLT (ELECTROSURGICAL) ×1 IMPLANT
GEL ULTRASOUND 20GR AQUASONIC (MISCELLANEOUS) IMPLANT
GLOVE BIO SURGEON STRL SZ 6.5 (GLOVE) ×2 IMPLANT
GLOVE BIOGEL PI IND STRL 6.5 (GLOVE) IMPLANT
GLOVE BIOGEL PI IND STRL 7.5 (GLOVE) IMPLANT
GLOVE BIOGEL PI INDICATOR 6.5 (GLOVE) ×1
GLOVE BIOGEL PI INDICATOR 7.5 (GLOVE) ×2
GLOVE ECLIPSE 6.5 STRL STRAW (GLOVE) ×1 IMPLANT
GLOVE ECLIPSE 7.0 STRL STRAW (GLOVE) ×2 IMPLANT
GLOVE SS BIOGEL STRL SZ 6.5 (GLOVE) IMPLANT
GLOVE SS BIOGEL STRL SZ 7 (GLOVE) ×1 IMPLANT
GLOVE SUPERSENSE BIOGEL SZ 6.5 (GLOVE) ×2
GLOVE SUPERSENSE BIOGEL SZ 7 (GLOVE) ×1
GLOVE SURG SS PI 6.5 STRL IVOR (GLOVE) ×1 IMPLANT
GOWN STRL REUS W/ TWL LRG LVL3 (GOWN DISPOSABLE) ×3 IMPLANT
GOWN STRL REUS W/TWL LRG LVL3 (GOWN DISPOSABLE) ×6
KIT BASIN OR (CUSTOM PROCEDURE TRAY) ×2 IMPLANT
KIT ROOM TURNOVER OR (KITS) ×2 IMPLANT
LIQUID BAND (GAUZE/BANDAGES/DRESSINGS) ×2 IMPLANT
NS IRRIG 1000ML POUR BTL (IV SOLUTION) ×2 IMPLANT
PACK CV ACCESS (CUSTOM PROCEDURE TRAY) ×2 IMPLANT
PAD ARMBOARD 7.5X6 YLW CONV (MISCELLANEOUS) ×4 IMPLANT
SUT PROLENE 6 0 BV (SUTURE) ×5 IMPLANT
SUT VIC AB 3-0 SH 27 (SUTURE) ×2
SUT VIC AB 3-0 SH 27X BRD (SUTURE) ×1 IMPLANT
TOWEL OR 17X24 6PK STRL BLUE (TOWEL DISPOSABLE) ×1 IMPLANT
UNDERPAD 30X30 INCONTINENT (UNDERPADS AND DIAPERS) ×2 IMPLANT
WATER STERILE IRR 1000ML POUR (IV SOLUTION) ×2 IMPLANT

## 2015-09-10 NOTE — Transfer of Care (Signed)
Immediate Anesthesia Transfer of Care Note  Patient: [name] [name]  Procedure(s) Performed: Procedure(s): BRACHIOCEPHALIC ARTERIOVENOUS (AV) FISTULA CREATION (Left)  Patient Location: PACU  Anesthesia Type:MAC  Level of Consciousness: awake, alert , oriented and patient cooperative  Airway & Oxygen Therapy: Patient Spontanous Breathing and Patient connected to face mask oxygen  Post-op Assessment: Report given to RN, Post -op Vital signs reviewed and stable, Patient moving all extremities, Patient moving all extremities X 4 and Patient able to stick tongue midline  Post vital signs: Reviewed and stable  Last Vitals:  Filed Vitals:   09/09/15 2001 09/10/15 0510  BP: 161/74 143/67  Pulse: 69 72  Temp: 36.5 C 36.7 C  Resp: 18 18    Complications: No apparent anesthesia complications

## 2015-09-10 NOTE — Anesthesia Postprocedure Evaluation (Signed)
Anesthesia Post Note  Patient: [name] [name]  Procedure(s) Performed: Procedure(s) (LRB): BRACHIOCEPHALIC ARTERIOVENOUS (AV) FISTULA CREATION (Left)  Patient location during evaluation: PACU Anesthesia Type: MAC Level of consciousness: awake and alert Pain management: pain level controlled Vital Signs Assessment: post-procedure vital signs reviewed and stable Respiratory status: spontaneous breathing and respiratory function stable Cardiovascular status: stable Anesthetic complications: no    Last Vitals:  Filed Vitals:   09/10/15 1345 09/10/15 1406  BP: 152/92 148/79  Pulse: 72 69  Temp: 36.5 C 36.7 C  Resp: 24 18    Last Pain:  Filed Vitals:   09/10/15 1407  PainSc: 0-No pain                 Rayland Hamed DANIEL

## 2015-09-10 NOTE — Evaluation (Addendum)
Occupational Therapy Evaluation Patient Details Name: [name] [name] L Ladson MRN: [id number] DOB: 09/30/1936 Today's Date: 09/10/2015    History of Present Illness 79 y.o. male with Acute on chronic kidney failure, Metabolic acidosis, and gout, admitted with acute encephalopathy.   Clinical Impression   This 79 yo male admitted with above presents to acute OT with decreased balance, decreased endurance, decreased mobility all affecting his PLOF of independent with basic ADLs. He will benefit from acute OT with follow up HHOT to work towards Mod I/Independent level.    Follow Up Recommendations  SNF;Other (comment) (feel SNF best option; if family/pt declines then HHOT/PT)    Equipment Recommendations  Tub/shower bench       Precautions / Restrictions Precautions Precautions: Fall Restrictions Weight Bearing Restrictions: No      Mobility Bed Mobility Overal bed mobility: Needs Assistance Bed Mobility: Supine to Sit;Sit to Supine     Supine to sit: Supervision Sit to supine: Min assist   General bed mobility comments: pt able to sit up to side of bed with HOB slightly elevated without physical assist. Needed min A to bilateral LE's for return to supine  Transfers Overall transfer level: Needs assistance Equipment used: Rolling walker (2 wheeled) Transfers: Sit to/from Stand Sit to Stand: Min guard         General transfer comment: min-guard for safety, no physical assist needed, bed elavted 6"    Balance Overall balance assessment: Needs assistance Sitting-balance support: No upper extremity supported Sitting balance-Leahy Scale: Fair Sitting balance - Comments: pt did not feel he could lean forward or bring foot up to don sock due to decreased energy and his balance   Standing balance support: Bilateral upper extremity supported Standing balance-Leahy Scale: Poor Standing balance comment: Requires RW use                             ADL Overall ADL's :  Needs assistance/impaired Eating/Feeding: Independent;Sitting   Grooming: Set up;Sitting   Upper Body Bathing: Set up;Sitting   Lower Body Bathing: Moderate assistance (min guard A sit<>stand)   Upper Body Dressing : Set up;Sitting   Lower Body Dressing: Moderate assistance (min guard A sit<>stand)   Toilet Transfer: Min guard;Ambulation;RW   Toileting- ArchitectClothing Manipulation and Hygiene: Min guard;Sit to/from stand                         Pertinent Vitals/Pain Pain Assessment: No/denies pain     Hand Dominance Right   Extremity/Trunk Assessment Upper Extremity Assessment Upper Extremity Assessment: Generalized weakness           Communication Communication Communication: No difficulties   Cognition Arousal/Alertness: Awake/alert Behavior During Therapy: WFL for tasks assessed/performed Overall Cognitive Status: Within Functional Limits for tasks assessed                                Home Living Family/patient expects to be discharged to:: Private residence Living Arrangements: Spouse/significant other Available Help at Discharge: Family;Available 24 hours/day Type of Home: House Home Access: Stairs to enter Entergy CorporationEntrance Stairs-Number of Steps: 2 Entrance Stairs-Rails: Left Home Layout: One level     Bathroom Shower/Tub: Chief Strategy OfficerTub/shower unit   Bathroom Toilet: Standard     Home Equipment: Cane - quad;Hand held Careers information officershower head;Walker - 2 wheels          Prior Functioning/Environment Level of Independence:  Independent with assistive device(s)        Comments: per pt he sometimes uses an assistive device, it depends on the day, gets down in the tub when he feels well      OT Diagnosis: Generalized weakness   OT Problem List: Decreased strength;Decreased activity tolerance;Impaired balance (sitting and/or standing);Decreased knowledge of use of DME or AE   OT Treatment/Interventions: Self-care/ADL training;Patient/family education;Balance  training;DME and/or AE instruction;Therapeutic activities    OT Goals(Current goals can be found in the care plan section) Acute Rehab OT Goals Patient Stated Goal: to go home when I feel better OT Goal Formulation: With patient Time For Goal Achievement: 09/24/15 Potential to Achieve Goals: Good ADL Goals Pt Will Perform Grooming: with set-up;with supervision;standing (2 tasks) Pt Will Perform Lower Body Bathing: with set-up;with supervision;with adaptive equipment;sit to/from stand Pt Will Perform Lower Body Dressing: with set-up;with supervision;with adaptive equipment;sit to/from stand Pt Will Transfer to Toilet: with supervision;ambulating;bedside commode (over toilet) Pt Will Perform Toileting - Clothing Manipulation and hygiene: with supervision;sit to/from stand Additional ADL Goal #1: Pt will be S in and OOB for basic ADLs  OT Frequency: Min 2X/week           Co-evaluation PT/OT/SLP Co-Evaluation/Treatment: Yes Reason for Co-Treatment:  (pt is to be in sx/hemodialysis rest of day) PT goals addressed during session: Balance;Mobility/safety with mobility;Proper use of DME;Strengthening/ROM OT goals addressed during session: Strengthening/ROM;ADL's and self-care      End of Session Equipment Utilized During Treatment: Engineer, water Communication:  (O2 with ambulation 92% on RA)  Activity Tolerance: Patient tolerated treatment well Patient left: in bed (getting ready to go to sx)   Time: 0950-1011 OT Time Calculation (min): 21 min Charges:  OT General Charges $OT Visit: 1 Procedure OT Evaluation $Initial OT Evaluation Tier I: 1 Procedure  Evette Georges [phone] 09/10/2015, 12:01 PM

## 2015-09-10 NOTE — Progress Notes (Signed)
Pt arrived from floor, placed on tele.

## 2015-09-10 NOTE — Progress Notes (Signed)
Subjective: Interval History: has no complaint , sleepy postop.  Objective: Vital signs in last 24 hours: Temp:  [97.7 F (36.5 C)-98.1 F (36.7 C)] 98 F (36.7 C) (11/25 1406) Pulse Rate:  [65-73] 69 (11/25 1406) Resp:  [18-24] 18 (11/25 1406) BP: (143-161)/(67-92) 148/79 mmHg (11/25 1406) SpO2:  [91 %-98 %] 97 % (11/25 1406) Weight:  [84.5 kg (186 lb 4.6 oz)] 84.5 kg (186 lb 4.6 oz) (11/25 0510) Weight change: 2.7 kg (5 lb 15.2 oz)  Intake/Output from previous day: 11/24 0701 - 11/25 0700 In: 1052.5 [P.O.:920; I.V.:22.5; IV Piggyback:110] Out: 700 [Urine:700] Intake/Output this shift: Total I/O In: 690 [P.O.:240; I.V.:450] Out: 250 [Urine:250]  General appearance: mildly obese, slowed mentation and ox3 Resp: diminished breath sounds bilaterally and rales bibasilar Chest wall: R IJ cath,   CV reg, Gr 2/6 M 2 + edema Lungs crackle in bases Abdm liver down 5 cm extrem 2+ edema,  New AVF LUA B&T Lab Results:  Recent Labs  09/09/15 0227 09/10/15 0239  WBC 6.6 7.8  HGB 8.7* 8.2*  HCT 27.6* 26.4*  PLT 59* 65*   BMET:  Recent Labs  09/09/15 0227 09/10/15 0239  NA 137 138  K 4.8 4.5  CL 102 106  CO2 26 24  GLUCOSE 300* 286*  BUN 75* 85*  CREATININE 4.00* 4.35*  CALCIUM 8.7* 9.0   No results for input(s): PTH in the last 72 hours. Iron Studies: No results for input(s): IRON, TIBC, TRANSFERRIN, FERRITIN in the last 72 hours.  Studies/Results: No results found.  I have reviewed the patient's current medications.  Assessment/Plan: 1 new ESRD for hd today.  Vol xs. Has perm access to go to [facility] 2 HTN controlled 3 Anemai esa/Fe 4 HPHT vit D 5 Dm controlled 6 CAd 7 gout off Allopurinol withlow ptlt 8 low ptlt a little better HIT pending 9 Hypothyroid P HD, ESA, lower vol , vit D    LOS: 6 days   Kanyon Bunn L 09/10/2015,5:41 PM

## 2015-09-10 NOTE — Progress Notes (Signed)
[facility] Progress Note    Patient: [name] ConradRoy L Guilford    BJY:[id number]  DOB: 03/26/1936     DOA: 09/03/2015 Date of Service: the patient was seen and examined on 09/10/2015  Brief Summary of Hospitalization:   79 y.o. ?  atrial fibrillation on Coumadin,  Diabetes ty ii,  hypertension , coronary artery disease+ ischemic cardiomyopathy and chronic kidney disease.  discharged from the hospital 11/16 three-day stay for HCAP and acute on chronic combined heart failure.  Patient was discharged home on Augmentin.   Patient really has not been out of bed since hospital discharge.  Found to have toxic metabolic encephalopathy 2/2 to med and rising creatinine Eventually started dialysis 11/22  Consultants: Nephrology  [facility] Antibiotics: Augmentin 09/01/2015  start date end date 09/08/2015   SUBJ Doing ok Alert oriented in NAd tol diet No cp no SOB No fever nor chills   Assessment and Plan:  1. Acute on chronic kidney failure [facility](HCC) Patient presents with complaints of worsening renal function with confusion. Nephrology was consulted and recommend to start hemodialysis as well as Lasix. [facility]22 Vascular surgery was also consulted for a possible fistula placement on 09/10/15 hold warfarin until that procedure-On Argatroban Once access and fistula placed can consider Clip Will probably need SNF Needs to be OOB for ~ 4 hours to manage dialysis PLT counts above 50-so should be able to perform procedure in am  2. Thrombocytopenia first noticed on 11/22 HIT was + 11/23-Get SRA as well to confirm Could also be 2/2 Allopurinol and Crestor-both which have been d/c'd PLT smear just showed TCP without morphological changes   2. Metabolic acidosis Resolved with hemodialysis.  3. Gout. Continue with prednisone. improving significantly. Start on low-dose tramadol. Allopurinol d/c 11/23 Most likely with  improvement with hemodialysis  4. acute encephalopathy. Patient initially presented with confusion and acute encephalopathy which was thought to be secondary to narcotics. 2/2 to nephropathy secondary to uremia.  5  Hypothyroidism TSH within normal limit. Continue Synthroid 175 daily  6  Type II diabetes mellitus with renal manifestations (HCC) Holding oral hypoglycemic agent and placing the patient on sliding scale insulin. Sugars 147-203  7  Essential hypertension Slightly ?  blood pressure. Added back Coreg 12.5 bid  8  Atrial fibrillation CHad2Vasc2~ 4 Currently rate controlled on Coreg 12.5 bid [facility]. Resume coumadin once PLT are above 75  SCD's for now D/c argatroban for now  9  HCAP (healthcare-associated pneumonia) Patient had pneumonia last admission which is currently improving. Augmentin - Last dose 09/08/2015  DVT Prophylaxis: SCD Nutrition: Renal diet   Advance goals of care discussion: Full code  CALLED [phone]  Disposition:  Expected discharge date: 09/09/2015  Barriers to safe discharge: ACCESS PLACEMENT, tcp.  Needs CLIP for dialysis andhas to be able to sit up for dialysis   Intake/Output Summary (Last 24 hours) at 09/10/15 1418 Last data filed at 09/10/15 1345  Gross per 24 hour  Intake 1022.5 ml  Output    950 ml  Net   72.5 ml   Filed Weights   09/08/15 1053 09/09/15 0533 09/10/15 0510  Weight: 81.8 kg (180 lb 5.4 oz) 81 kg (178 lb 9.2 oz) 84.5 kg (186 lb 4.6 oz)    Objective: Physical Exam: Filed Vitals:   09/10/15 1315 09/10/15 1330 09/10/15 1345 09/10/15 1406  BP: 151/85 156/70 152/92 148/79  Pulse: 71 65 72 69  Temp:  97.7 F (36.5 C) 98 F (36.7 C)  TempSrc:    Oral  Resp: Height:      Weight:      SpO2: 91% 92% 94% 97%   General: NAD Oral Mucosa moist. Cardiovascular: S1 and S2 Present, no Murmur, JVD flat Respiratory: Bilateral Air entry present and  basal Crackles, no  wheezes Abdomen: sfot nt nd Extremities: Bilateral  Pedal edema, no calf tenderness  Data Reviewed: CBC:  Recent Labs Lab 09/06/15 0348 09/07/15 0322 09/07/15 2017 09/08/15 0413 09/09/15 0227 09/10/15 0239  WBC 6.4 4.9 5.7 5.6 6.6 7.8  NEUTROABS 5.0  --   --   --   --   --   HGB 8.9* 8.5* 8.8* 8.5* 8.7* 8.2*  HCT 27.6* 27.4* 27.2* 26.4* 27.6* 26.4*  MCV 92.0 92.9 91.3 92.3 94.2 95.7  PLT 144* 148* 68* 59* 59* 65*   Basic Metabolic Panel:  Recent Labs Lab 09/07/15 0322 09/07/15 2017 09/08/15 0413 09/09/15 0227 09/10/15 0239  NA 136 137 137 137 138  K 5.5* 4.4 4.3 4.8 4.5  CL 107 104 105 102 106  CO2 17* 21* 21* 26 24  GLUCOSE 243* 300* 219* 300* 286*  BUN 141* 95* 95* 75* 85*  CREATININE 7.00* 4.93* 4.96* 4.00* 4.35*  CALCIUM 8.8* 8.4* 8.5* 8.7* 9.0  MG 2.2  --   --   --   --   PHOS  --  5.5* 5.3* 4.8*  --    Liver Function Tests:  Recent Labs Lab 09/04/15 0345 09/05/15 0156 09/06/15 0348 09/07/15 2017 09/08/15 0413 09/09/15 0227  AST 57* 55* 50*  --   --   --   ALT 34 34 39  --   --   --   ALKPHOS 57 57 55  --   --   --   BILITOT 0.4 0.6 0.5  --   --   --   PROT 5.6* 6.1* 6.2*  --   --   --   ALBUMIN 2.5* 2.4* 2.6* 2.7* 2.5* 2.5*   No results for input(s): LIPASE, AMYLASE in the last 168 hours. No results for input(s): AMMONIA in the last 168 hours.  Cardiac Enzymes:  Recent Labs Lab 09/04/15 1250  TROPONINI <0.03   BNP (last 3 results)  Recent Labs  12/10/14 0530 08/29/15 1235 09/03/15 1356  BNP 326.5* 1483.1* 1191.8*    ProBNP (last 3 results) No results for input(s): PROBNP in the last 8760 hours.   CBG:  Recent Labs Lab 09/09/15 1644 09/09/15 2050 09/10/15 0556 09/10/15 1032 09/10/15 1258  GLUCAP 340* 286* 203* 148* 147*    Recent Results (from the past 240 hour(s))  MRSA PCR Screening     Status: None   Collection Time: 09/01/15  9:06 AM  Result Value Ref Range Status   MRSA by PCR NEGATIVE NEGATIVE Final     Comment:        The GeneXpert MRSA Assay (FDA approved for NASAL specimens only), is one component of a comprehensive MRSA colonization surveillance program. It is not intended to diagnose MRSA infection nor to guide or monitor treatment for MRSA infections.      Studies: No results found.   Scheduled Meds: . calcitRIOL  0.25 mcg Oral Q48H   And  . calcitRIOL  0.5 mcg Oral Q48H  . calcium acetate  667 mg Oral TID WC  . carvedilol  12.5 mg Oral BID WC  . ceFAZolin      . [  START ON 09/14/2015] darbepoetin (ARANESP) injection - DIALYSIS  200 mcg Intravenous Q Tue-HD  . docusate sodium  100 mg Oral BID  . ferric gluconate (FERRLECIT/NULECIT) IV  125 mg Intravenous Daily  . insulin aspart  0-5 Units Subcutaneous QHS  . insulin aspart  0-9 Units Subcutaneous TID WC  . isosorbide mononitrate  60 mg Oral QHS  . levothyroxine  175 mcg Oral Daily  . multivitamin  1 tablet Oral QHS  . olopatadine  1 drop Both Eyes Daily  . predniSONE  20 mg Oral Q breakfast  . sodium chloride  3 mL Intravenous Q12H  . sodium chloride  3 mL Intravenous Q12H  . tamsulosin  0.4 mg Oral Daily  . Warfarin - Pharmacist Dosing Inpatient   Does not apply q1800   Continuous Infusions: . sodium chloride 10 mL/hr at 09/10/15 1039  . argatroban      Time spent: 30 minutes   Pleas Koch, MD [facility] [phone]

## 2015-09-10 NOTE — Progress Notes (Signed)
ANTICOAGULATION CONSULT NOTE - Follow Up Consult  Pharmacy Consult for argatroban Indication: r/o HIT  Labs:  Recent Labs  09/08/15 0413  09/09/15 0227  09/09/15 1535 09/09/15 1936 09/10/15 0239  HGB 8.5*  --  8.7*  --   --   --  8.2*  HCT 26.4*  --  27.6*  --   --   --  26.4*  PLT 59*  --  59*  --   --   --  65*  APTT  --   < > 117*  < > 96* 88* 86*  LABPROT 18.9*  --  28.6*  --   --   --  23.3*  INR 1.58*  --  2.75*  --   --   --  2.09*  CREATININE 4.96*  --  4.00*  --   --   --  4.35*  < > = values in this interval not displayed.  Estimated Creatinine Clearance: 14.2 mL/min (by C-G formula based on Cr of 4.35).  Assessment: 79 yo male with ESRD and new HD start. He is now noted with concern for HIT and pharmacy consulted to dose argatroban for r/o HIT. He was on coumadin PTA for afib and now holding with thrombocytopenia -He is now s/p AV fistula today and to restart argatroban at 9pm tonight ( 8 hours post procedure per MD) -Last argatroban rate was 0.4 mcg/kg/min and aPTT was at goal (aPTT= 86)  11/23: HIT antibody: 0.509 (low positive) 11/24: SRA pending  This afternoon, order for argatroban was d/c'd per Dr. Mahala MenghiniSamtani.  Unable to reach him currently to discuss, however, spoke to Elray McgregorMary Lynch, NP, and decided to resume argatroban since SRA was still pending.  Then spoke to RN and she noted he had some amber colored urine, and patient was currently being taken upstairs for HD.    Goal of Therapy:  APTT 50-90 Monitor platelets by anticoagulation protocol: Yes   Plan:  -Plan to initiate argatroban at previous rate when patient returns from HD unless hematuria worsens. -F/u with Dr. Mahala MenghiniSamtani in AM. -Aptt 2 hours after argatroban restarted -Will follow SRA results  -Continue to hold coumadin  Tad MooreJessica Keelan Pomerleau, Pharm D, BCPS  Clinical Pharmacist Pager [phone]  09/10/2015 9:55 PM

## 2015-09-10 NOTE — H&P (View-Only) (Signed)
  [facility] Progress Note  Right IJ TDC placed this am by IR. Vein mapping reviewed. The patient is right handed. Patient has adequate vein conduit for left brachial-cephalic AV fistula. Plan for left arm brachial-cephalic AV fistula creation on Friday, 09/10/15 with Dr. Hart RochesterLawson. INR will need to be less than 1.6. Discussed with the family and patient and they are willing to proceed.   Maris BergerKimberly Elizar Alpern, PA-C [facility] Office: [phone] Pager: [phone] 09/07/2015 10:50 AM     Addendum  As noted above.  Risk, benefits, and alternatives to access surgery were discussed.  The patient is aware the risks include but are not limited to: bleeding, infection, steal syndrome, nerve damage, ischemic monomelic neuropathy, failure to mature, need for additional procedures, death and stroke.  The patient agrees to proceed forward with the procedure.   Leonides SakeBrian Chen, MD [facility] [place] Office: [phone] Pager: [phone]  09/07/2015, 2:20 PM

## 2015-09-10 NOTE — Op Note (Signed)
    Patient name: [name] [name] MRN: [id number] DOB: 02/03/1936 Sex: male  09/03/2015 - 09/10/2015 Pre-operative Diagnosis: ESRD Post-operative diagnosis:  Same Surgeon:  Durene CalBrabham, Wells Assistants:  M.Collins Procedure:   Left brachio-cephalic fistula Anesthesia:  MAC Blood Loss:  See anesthesia record Specimens:  none  Findings:  Excellent 5mm vein and 4mm artery  Indications:  Right handed male in need of HD access.  Procedure:  The patient was identified in the holding area [facility]  The patient was then placed supine on the table. MAC anesthesia was administered.  The patient was prepped and draped in the usual sterile fashion.  A time out was called and antibiotics were administered.  1% lidocaine was used for local anesthesia.  The cephalic vein was evaluated at the ante-cubital space and traced proximally.  It was an excellent vein.  I did not want to perform a radio-cephalic fistula, as he had a mid arm IV in place just prior to the procedure.  A transverse incision was made just above the ante-cubital crease.  The cephalic vein was easily identified.  It was circumferentially exposed and then mobilized within the width of the incision.  It was marked for orientation.  Next, the brachial artery was exposed and encircled with red vessel loops.  The vein was the ligated distally.  It was instilled with saline and it distended nicely.  The artery was then occluded with clamps.  A longitudinal arteriotomy was made and extended with Potts scissors.  The vein was cut to the appropriate length, and a end to side anastamosis was created with 6-0 prolene.  Prior to completion, the appropriate flushing maneuvers were performed, and the anastamosis was completed.  I inspected the course of the vein and removed any tissue that was obstructive.  One branch was ligated.  There was an excellent thrill and a triphasic radial and ulnar signal at the wrist.  Hemostasis was achieved and the  incision was closed with 2 layers of 3-0 vicryl.  Dermabond was applied  Disposition:  To PACU in stable condition.   Juleen ChinaV. Wells Parnell Spieler, M.D. [facility] [place] Office: [phone] Pager:  [phone]

## 2015-09-10 NOTE — Progress Notes (Signed)
Gearldine Bienenstockoy Ollinger returned ro [phone]  from surgery - left arm AV fistula. Positive bruitt and thrill to left upper arm.  Incision closed to left antecubital area. Edges well-approximated and is open to air.  Recovery nurse relayed Argatroban is not to restart until 2100, as ordered.  Will inform Cyndie MullLetitia Teasley, patient's primary nurse.

## 2015-09-10 NOTE — Anesthesia Preprocedure Evaluation (Addendum)
Anesthesia Evaluation  Patient identified by MRN, date of birth, ID band Patient awake    Reviewed: Allergy & Precautions, NPO status , Patient's Chart, lab work & pertinent test results  History of Anesthesia Complications Negative for: history of anesthetic complications  Airway Mallampati: I  TM Distance: >3 FB Neck ROM: Full    Dental  (+) Edentulous Upper, Edentulous Lower   Pulmonary former smoker,    Pulmonary exam normal        Cardiovascular hypertension, Pt. on medications + CAD, + Cardiac Stents and +CHF  Normal cardiovascular exam  EF 35% echo 2012 ; EF 44% myoview 2012; EF 76[facility]   Neuro/Psych CVA    GI/Hepatic negative GI ROS, Neg liver ROS,   Endo/Other  diabetesHypothyroidism   Renal/GU CRFRenal disease     Musculoskeletal   Abdominal   Peds  Hematology   Anesthesia Other Findings   Reproductive/Obstetrics                            Anesthesia Physical Anesthesia Plan  ASA: III  Anesthesia Plan: MAC   Post-op Pain Management:    Induction:   Airway Management Planned: Simple Face Mask  Additional Equipment:   Intra-op Plan:   Post-operative Plan:   Informed Consent: I have reviewed the patients History and Physical, chart, labs and discussed the procedure including the risks, benefits and alternatives for the proposed anesthesia with the patient or authorized representative who has indicated his/her understanding and acceptance.     Plan Discussed with: CRNA, Anesthesiologist and Surgeon  Anesthesia Plan Comments:        Anesthesia Quick Evaluation

## 2015-09-10 NOTE — Interval H&P Note (Signed)
History and Physical Interval Note:  09/10/2015 11:14 AM  [name] [name]  has presented today for surgery, with the diagnosis of End Stage Renal Disease N18.6  The various methods of treatment have been discussed with the patient and family. After consideration of risks, benefits and other options for treatment, the patient has consented to  Procedure(s): BRACHIOCEPHALIC ARTERIOVENOUS (AV) FISTULA CREATION (Left) as a surgical intervention .  The patient's history has been reviewed, patient examined, no change in status, stable for surgery.  I have reviewed the patient's chart and labs.  Questions were answered to the patient's satisfaction.     [name], Wells  I have recommended proceeding with left arm fistula.  Risks and benefits discussed and agreed to by patient   [name]

## 2015-09-10 NOTE — Progress Notes (Signed)
Physical Therapy Treatment Patient Details Name: [name] [name] MRN: [id number] DOB: 12-26-1935 Today's Date: 09/10/2015    History of Present Illness 79 y.o. male with Acute on chronic kidney failure, Metabolic acidosis, and gout, admitted with acute encephalopathy.    PT Comments    Pt progressing slowly, fatigues very quickly with 2 standing rest breaks required with 80' of ambulation. Pt O2 sats remained above 90% on RA with ambulation but pt with 2/4 DOE. PT will continue to follow.   Follow Up Recommendations  SNF, though if family declines, recommend HHPT/ OT     Equipment Recommendations  None recommended by PT    Recommendations for Other Services       Precautions / Restrictions Precautions Precautions: Fall Restrictions Weight Bearing Restrictions: No    Mobility  Bed Mobility Overal bed mobility: Needs Assistance Bed Mobility: Supine to Sit;Sit to Supine     Supine to sit: Supervision Sit to supine: Min assist   General bed mobility comments: pt able to sit up to side of bed with HOB slightly elevated without physical assist. Needed min A to bilateral LE's for return to supine  Transfers Overall transfer level: Needs assistance Equipment used: Rolling walker (2 wheeled) Transfers: Sit to/from Stand Sit to Stand: Min guard         General transfer comment: min-guard for safety, no physical assist needed, bed elavted 6"  Ambulation/Gait Ambulation/Gait assistance: Min guard Ambulation Distance (Feet): 80 Feet Assistive device: Rolling walker (2 wheeled) Gait Pattern/deviations: Step-through pattern;Decreased stride length;Trunk flexed Gait velocity: decreased Gait velocity interpretation: Below normal speed for age/gender General Gait Details: pt took 2 standing rest breaks with flexed posture and forearms on RW during 80' of ambulation. O2 sats remained above 90% with ambulation on RA but pt DOE 2/4. Pt reports feeling very low of  energy.   Stairs            Wheelchair Mobility    Modified Rankin (Stroke Patients Only)       Balance Overall balance assessment: Needs assistance Sitting-balance support: No upper extremity supported Sitting balance-Leahy Scale: Fair Sitting balance - Comments: pt did not feel he could lean forward to don sock due to decreased energy but this affecting his balance   Standing balance support: Bilateral upper extremity supported Standing balance-Leahy Scale: Poor Standing balance comment: UE support for safety with standing                    Cognition Arousal/Alertness: Awake/alert Behavior During Therapy: WFL for tasks assessed/performed Overall Cognitive Status: Within Functional Limits for tasks assessed                      Exercises      General Comments General comments (skin integrity, edema, etc.): pt mobility has declined significantly since last admission, would benefit from SNF for rehab but family declining this option currently      Pertinent Vitals/Pain Pain Assessment: No/denies pain    Home Living                      Prior Function            PT Goals (current goals can now be found in the care plan section) Acute Rehab PT Goals Patient Stated Goal: none stated PT Goal Formulation: With patient Time For Goal Achievement: 09/19/15 Potential to Achieve Goals: Fair Progress towards PT goals: Progressing toward goals    Frequency  Min 2X/week    PT Plan Current plan remains appropriate    Co-evaluation PT/OT/SLP Co-Evaluation/Treatment: Yes Reason for Co-Treatment: Other (comment) (pt to be in surgery/ HD rest of day) PT goals addressed during session: Balance;Mobility/safety with mobility;Proper use of DME;Strengthening/ROM       End of Session Equipment Utilized During Treatment: Gait belt;Oxygen Activity Tolerance: Patient limited by fatigue Patient left: in bed;with call bell/phone within reach;with  nursing/sitter in room;Other (comment) (transport team had just arrived)     Time: [id number] PT Time Calculation (min) (ACUTE ONLY): 20 min  Charges:  $Gait Training: 8-22 mins                    G Codes:     [name] [name], PT  Acute Rehab Services  [phone]  [name] [name], [name] [name] 09/10/2015, 10:23 AM

## 2015-09-10 NOTE — Progress Notes (Signed)
ANTICOAGULATION CONSULT NOTE - Follow Up Consult  Pharmacy Consult for argatroban Indication: r/o HIT  Labs:  Recent Labs  09/08/15 0413  09/09/15 0227  09/09/15 1535 09/09/15 1936 09/10/15 0239  HGB 8.5*  --  8.7*  --   --   --  8.2*  HCT 26.4*  --  27.6*  --   --   --  26.4*  PLT 59*  --  59*  --   --   --  65*  APTT  --   < > 117*  < > 96* 88* 86*  LABPROT 18.9*  --  28.6*  --   --   --  23.3*  INR 1.58*  --  2.75*  --   --   --  2.09*  CREATININE 4.96*  --  4.00*  --   --   --  4.35*  < > = values in this interval not displayed.  Estimated Creatinine Clearance: 14.2 mL/min (by C-G formula based on Cr of 4.35).  Assessment: 79 yo male with ESRD and new HD start. He is now noted with concern for HIT and pharmacy consulted to dose argatroban for r/o HIT. He was on coumadin PTA for afib and now holding with thrombocytopenia -He is now s/p AV fistula today and to restart argatroban at 9pm tonight ( 8 hours post procedure per MD) -Last argatroban rate was 0.4 mcg/kg/min and aPTT was at goal (aPTT= 86)  11/23: HIT antibody: 0.509 (low positive) 11/24: SRA pending  Goal of Therapy:  APTT 50-90 Monitor platelets by anticoagulation protocol: Yes   Plan:  -MD restarting Argatroban at 0.544mcg/kg/min at 9pm -Aptt 2 hours after argatroban restarted -Will follow SRA results  -Continue to hold coumadin  Harland GermanAndrew Kehinde Totzke, Pharm D 09/10/2015 2:18 PM

## 2015-09-11 ENCOUNTER — Other Ambulatory Visit: Payer: Self-pay | Admitting: *Deleted

## 2015-09-11 DIAGNOSIS — Z4931 Encounter for adequacy testing for hemodialysis: Secondary | ICD-10-CM

## 2015-09-11 DIAGNOSIS — N186 End stage renal disease: Secondary | ICD-10-CM

## 2015-09-11 LAB — BASIC METABOLIC PANEL
Anion gap: 9 (ref 5–15)
BUN: 68 mg/dL — AB (ref 6–20)
CALCIUM: 8.4 mg/dL — AB (ref 8.9–10.3)
CO2: 25 mmol/L (ref 22–32)
CREATININE: 3.51 mg/dL — AB (ref 0.61–1.24)
Chloride: 103 mmol/L (ref 101–111)
GFR calc non Af Amer: 15 mL/min — ABNORMAL LOW (ref >60)
GFR, EST AFRICAN AMERICAN: 18 mL/min — AB (ref >60)
GLUCOSE: 222 mg/dL — AB (ref 65–99)
Potassium: 4 mmol/L (ref 3.5–5.1)
Sodium: 137 mmol/L (ref 135–145)

## 2015-09-11 LAB — CBC WITH DIFFERENTIAL/PLATELET
BASOS ABS: 0 10*3/uL (ref 0.0–0.1)
BASOS PCT: 0 %
Eosinophils Absolute: 0.5 10*3/uL (ref 0.0–0.7)
Eosinophils Relative: 7 %
HEMATOCRIT: 27.5 % — AB (ref 39.0–52.0)
Hemoglobin: 8.7 g/dL — ABNORMAL LOW (ref 13.0–17.0)
LYMPHS ABS: 0.7 10*3/uL (ref 0.7–4.0)
Lymphocytes Relative: 10 %
MCH: 30.5 pg (ref 26.0–34.0)
MCHC: 31.6 g/dL (ref 30.0–36.0)
MCV: 96.5 fL (ref 78.0–100.0)
MONOS PCT: 6 %
Monocytes Absolute: 0.4 10*3/uL (ref 0.1–1.0)
NEUTROS ABS: 5.6 10*3/uL (ref 1.7–7.7)
Neutrophils Relative %: 77 %
Platelets: 64 10*3/uL — ABNORMAL LOW (ref 150–400)
RBC: 2.85 MIL/uL — ABNORMAL LOW (ref 4.22–5.81)
RDW: 15.1 % (ref 11.5–15.5)
WBC: 7.2 10*3/uL (ref 4.0–10.5)

## 2015-09-11 LAB — APTT
APTT: 52 s — AB (ref 24–37)
aPTT: 51 seconds — ABNORMAL HIGH (ref 24–37)

## 2015-09-11 LAB — GLUCOSE, CAPILLARY
GLUCOSE-CAPILLARY: 248 mg/dL — AB (ref 65–99)
Glucose-Capillary: 199 mg/dL — ABNORMAL HIGH (ref 65–99)
Glucose-Capillary: 291 mg/dL — ABNORMAL HIGH (ref 65–99)
Glucose-Capillary: 325 mg/dL — ABNORMAL HIGH (ref 65–99)

## 2015-09-11 LAB — PROTIME-INR
INR: 1.53 — ABNORMAL HIGH (ref 0.00–1.49)
Prothrombin Time: 18.5 seconds — ABNORMAL HIGH (ref 11.6–15.2)

## 2015-09-11 MED ORDER — ARGATROBAN 50 MG/50ML IV SOLN
0.4000 ug/kg/min | INTRAVENOUS | Status: DC
  Administered 2015-09-11 – 2015-09-12 (×2): 0.4 ug/kg/min via INTRAVENOUS

## 2015-09-11 NOTE — Progress Notes (Signed)
ANTICOAGULATION CONSULT NOTE - Follow Up Consult  Pharmacy Consult for argatroban Indication: r/o HIT  Allergies  Allergen Reactions  . Tomato Other (See Comments)    Too much acid - cannot take due to kidney issues    Patient Measurements: Height: 5\' 9"  (175.3 cm) Weight: 190 lb 0.6 oz (86.2 kg) IBW/kg (Calculated) : 70.7 Heparin Dosing Weight:   Vital Signs: Temp: 97.7 F (36.5 C) (11/26 1122) Temp Source: Oral (11/26 1122) BP: 121/43 mmHg (11/26 1122) Pulse Rate: 71 (11/26 1122)  Labs:  Recent Labs  09/09/15 0227  09/10/15 0239 09/11/15 0527 09/11/15 1140 09/11/15 1530  HGB 8.7*  --  8.2*  --  8.7*  --   HCT 27.6*  --  26.4*  --  27.5*  --   PLT 59*  --  65*  --  64*  --   APTT 117*  < > 86* 52*  --  51*  LABPROT 28.6*  --  23.3* 18.5*  --   --   INR 2.75*  --  2.09* 1.53*  --   --   CREATININE 4.00*  --  4.35*  --  3.51*  --   < > = values in this interval not displayed.  Estimated Creatinine Clearance: 19.2 mL/min (by C-G formula based on Cr of 3.51).   Medications:  Scheduled:  . calcitRIOL  0.25 mcg Oral Q48H   And  . calcitRIOL  0.5 mcg Oral Q48H  . calcium acetate  667 mg Oral TID WC  . carvedilol  12.5 mg Oral BID WC  . [START ON 09/14/2015] darbepoetin (ARANESP) injection - DIALYSIS  200 mcg Intravenous Q Tue-HD  . docusate sodium  100 mg Oral BID  . ferric gluconate (FERRLECIT/NULECIT) IV  125 mg Intravenous Daily  . insulin aspart  0-5 Units Subcutaneous QHS  . insulin aspart  0-9 Units Subcutaneous TID WC  . isosorbide mononitrate  60 mg Oral QHS  . levothyroxine  175 mcg Oral Daily  . multivitamin  1 tablet Oral QHS  . olopatadine  1 drop Both Eyes Daily  . predniSONE  20 mg Oral Q breakfast  . sodium chloride  3 mL Intravenous Q12H  . sodium chloride  3 mL Intravenous Q12H  . tamsulosin  0.4 mg Oral Daily   Infusions:  . sodium chloride 10 mL/hr at 09/10/15 1039  . argatroban 0.4 mcg/kg/min (09/11/15 [id number])    Assessment: 79 yo  male with r/o HIT is currently on therapeutic argatroban.  His aPTT is 51.  Goal of Therapy:  aPTT 50-90 seconds Monitor platelets by anticoagulation protocol: Yes   Plan:  - continue argatroban at 0.4 mcg/kg/min  Steffani Dionisio, Tsz-Yin 09/11/2015,4:46 PM

## 2015-09-11 NOTE — Progress Notes (Signed)
[facility] Progress Note    Patient: [name] [name]    UJW:[id number]  DOB: 1936-05-19     DOA: 09/03/2015 Date of Service: the patient was seen and examined on 09/11/2015  Brief Summary of Hospitalization:   79 y.o. ?  atrial fibrillation on Coumadin,  Diabetes ty ii,  hypertension , coronary artery disease+ ischemic cardiomyopathy and chronic kidney disease.  discharged from the hospital 11/16 three-day stay for HCAP and acute on chronic combined heart failure.  Patient was discharged home on Augmentin.   Patient really has not been out of bed since hospital discharge.  Found to have toxic metabolic encephalopathy 2/2 to med and rising creatinine Eventually started dialysis 11/22  Found to have low PLT count which is being worked up for HIIT vs Medication effect [crestor/allopurinol]  Consultants: Nephrology  Procedures: [facility] placement, hemodialysis Antibiotics: Augmentin 09/01/2015  start date end date 09/08/2015   SUBJ sitting in bed, had dialysis yesterday No pain in fistula No n/v tol diet Sitting in chair About to go for walk Nursing reports some dark urine which ha since resolved   Assessment and Plan:  1. Acute on chronic kidney failure [facility]) Patient presents with complaints of worsening renal function with confusion. Nephrology  Consulted started hemodialysis as well as Lasix. The patient underwent [facility]  Placement 11/22 Vascular surgery did fistula placement on 09/10/15 -On Argatroban Consider Clip Will probably need SNF Needs to be OOB for ~ 4 hours to manage dialysis  2. Thrombocytopenia first noticed on 11/22 HIT was + 11/23-Get SRA as well to confirm Could also be 2/2 Allopurinol and Crestor-both which have been d/c'd PLT smear just showed TCP without morphological changes plt counts 64 range now and steady   2. Metabolic acidosis Resolved with hemodialysis.  3. Gout. Continue with prednisone. improving significantly. Start on  low-dose tramadol. Allopurinol d/c 11/23 Most likely with improvement with hemodialysis  4. acute encephalopathy. Patient initially presented with confusion and acute encephalopathy which was thought to be secondary to narcotics. 2/2 to nephropathy secondary to uremia.  5  Hypothyroidism TSH within normal limit. Continue Synthroid 175 daily  6  Type II diabetes mellitus with renal manifestations (HCC) Holding oral hypoglycemic agent and placing the patient on sliding scale insulin. Sugars 147-203  7  Essential hypertension Slightly ?  blood pressure. Added back Coreg 12.5 bid  8  Atrial fibrillation CHad2Vasc2~ 4 Currently rate controlled on Coreg 12.5 bid INR was reversed for [facility] placement. Resume coumadin once PLT are above 75  SCD's for now continue argatroban for now as need AC  9  HCAP (healthcare-associated pneumonia) Patient had pneumonia last admission which is currently improving. Augmentin - Last dose 09/08/2015  DVT Prophylaxis: SCD Nutrition: Renal diet   Advance goals of care discussion: Full code  CALLED [phone]  Disposition:  Expected discharge date: 09/09/2015  Barriers to safe discharge: ACCESS PLACEMENT, tcp.  Needs CLIP for dialysis and has to be able to sit up for dialysis   Intake/Output Summary (Last 24 hours) at 09/11/15 1334 Last data filed at 09/11/15 0832  Gross per 24 hour  Intake 2285.07 ml  Output   2501 ml  Net -215.93 ml   Filed Weights   09/10/15 0510 09/10/15 2143 09/11/15 0053  Weight: 84.5 kg (186 lb 4.6 oz) 88.3 kg (194 lb 10.7 oz) 86.2 kg (190 lb 0.6 oz)    Objective: Physical Exam: Filed Vitals:   09/11/15 0053 09/11/15 0612 09/11/15 0838 09/11/15 1122  BP: 146/84 123/52  138/61 121/43  Pulse: 74 71 71 71  Temp: 98.1 F (36.7 C) 98.3 F (36.8 C) 97.8 F (36.6 C) 97.7 F (36.5 C)  TempSrc: Oral Oral Oral Oral  Resp: Height:      Weight: 86.2 kg (190 lb 0.6 oz)     SpO2: 99% 97% 95% 91%    General: NAD Oral Mucosa moist. Cardiovascular: S1 and S2 Present, no Murmur, JVD flat Respiratory: Bilateral Air entry present and  basal Crackles, no wheezes Abdomen: sfot nt nd Extremities: Bilateral  Pedal edema, no calf tenderness  Data Reviewed: CBC:  Recent Labs Lab 09/06/15 0348  09/07/15 2017 09/08/15 0413 09/09/15 0227 09/10/15 0239 09/11/15 1140  WBC 6.4  < > 5.7 5.6 6.6 7.8 7.2  NEUTROABS 5.0  --   --   --   --   --  5.6  HGB 8.9*  < > 8.8* 8.5* 8.7* 8.2* 8.7*  HCT 27.6*  < > 27.2* 26.4* 27.6* 26.4* 27.5*  MCV 92.0  < > 91.3 92.3 94.2 95.7 96.5  PLT 144*  < > 68* 59* 59* 65* 64*  < > = values in this interval not displayed. Basic Metabolic Panel:  Recent Labs Lab 09/07/15 0322 09/07/15 2017 09/08/15 0413 09/09/15 0227 09/10/15 0239 09/11/15 1140  NA 136 137 137 137 138 137  K 5.5* 4.4 4.3 4.8 4.5 4.0  CL 107 104 105 102 106 103  CO2 17* 21* 21* GLUCOSE 243* 300* 219* 300* 286* 222*  BUN 141* 95* 95* 75* 85* 68*  CREATININE 7.00* 4.93* 4.96* 4.00* 4.35* 3.51*  CALCIUM 8.8* 8.4* 8.5* 8.7* 9.0 8.4*  MG 2.2  --   --   --   --   --   PHOS  --  5.5* 5.3* 4.8*  --   --    Liver Function Tests:  Recent Labs Lab 09/05/15 0156 09/06/15 0348 09/07/15 2017 09/08/15 0413 09/09/15 0227  AST 55* 50*  --   --   --   ALT 34 39  --   --   --   ALKPHOS 57 55  --   --   --   BILITOT 0.6 0.5  --   --   --   PROT 6.1* 6.2*  --   --   --   ALBUMIN 2.4* 2.6* 2.7* 2.5* 2.5*   No results for input(s): LIPASE, AMYLASE in the last 168 hours. No results for input(s): AMMONIA in the last 168 hours.  Cardiac Enzymes: No results for input(s): CKTOTAL, CKMB, CKMBINDEX, TROPONINI in the last 168 hours. BNP (last 3 results)  Recent Labs  12/10/14 0530 08/29/15 1235 09/03/15 1356  BNP 326.5* 1483.1* 1191.8*    ProBNP (last 3 results) No results for input(s): PROBNP in the last 8760 hours.   CBG:  Recent Labs Lab 09/10/15 1258 09/10/15 1633  09/10/15 2114 09/11/15 0727 09/11/15 1121  GLUCAP 147* 202* 346* 248* 199*    No results found for this or any previous visit (from the past 240 hour(s)).   Studies: No results found.   Scheduled Meds: . calcitRIOL  0.25 mcg Oral Q48H   And  . calcitRIOL  0.5 mcg Oral Q48H  . calcium acetate  667 mg Oral TID WC  . carvedilol  12.5 mg Oral BID WC  . [START ON 09/14/2015] darbepoetin (ARANESP) injection - DIALYSIS  200 mcg Intravenous Q Tue-HD  . docusate sodium  100 mg Oral  BID  . ferric gluconate (FERRLECIT/NULECIT) IV  125 mg Intravenous Daily  . insulin aspart  0-5 Units Subcutaneous QHS  . insulin aspart  0-9 Units Subcutaneous TID WC  . isosorbide mononitrate  60 mg Oral QHS  . levothyroxine  175 mcg Oral Daily  . multivitamin  1 tablet Oral QHS  . olopatadine  1 drop Both Eyes Daily  . predniSONE  20 mg Oral Q breakfast  . sodium chloride  3 mL Intravenous Q12H  . sodium chloride  3 mL Intravenous Q12H  . tamsulosin  0.4 mg Oral Daily   Continuous Infusions: . sodium chloride 10 mL/hr at 09/10/15 1039  . argatroban 0.4 mcg/kg/min (09/11/15 0219)    Time spent: 30 minutes   Pleas KochJai Jashan Cotten, MD [facility] [phone]) [phone]

## 2015-09-11 NOTE — Progress Notes (Signed)
    Subjective  - POD #1  No complaints No steal symptoms  Physical Exam:  Palpable thrill in avf Palpable radial pulse       Assessment/Plan:  POD #1  Anticoagulation per pharmacy F/u 6 weeks with me  Durene CalBrabham, [place] 09/11/2015 7:43 AM --  Filed Vitals:   09/11/15 0053 09/11/15 0612  BP: 146/84 123/52  Pulse: 74 71  Temp: 98.1 F (36.7 C) 98.3 F (36.8 C)  Resp: 18 18    Intake/Output Summary (Last 24 hours) at 09/11/15 0743 Last data filed at 09/11/15 0209  Gross per 24 hour  Intake   1180 ml  Output   2251 ml  Net  -1071 ml     Laboratory CBC    Component Value Date/Time   WBC 7.8 09/10/2015 0239   HGB 8.2* 09/10/2015 0239   HCT 26.4* 09/10/2015 0239   PLT 65* 09/10/2015 0239    BMET    Component Value Date/Time   NA 138 09/10/2015 0239   NA 141 03/01/2015   K 4.5 09/10/2015 0239   CL 106 09/10/2015 0239   CO2 24 09/10/2015 0239   GLUCOSE 286* 09/10/2015 0239   BUN 85* 09/10/2015 0239   BUN 75* 03/01/2015   CREATININE 4.35* 09/10/2015 0239   CREATININE 3.6* 03/01/2015   CREATININE 3.42* 12/14/2014 1104   CALCIUM 9.0 09/10/2015 0239   GFRNONAA 12* 09/10/2015 0239   GFRNONAA 16* 12/14/2014 1104   GFRAA 14* 09/10/2015 0239   GFRAA 19* 12/14/2014 1104    COAG Lab Results  Component Value Date   INR 1.53* 09/11/2015   INR 2.09* 09/10/2015   INR 2.75* 09/09/2015   No results found for: PTT  Antibiotics Anti-infectives    Start     Dose/Rate Route Frequency Ordered Stop   09/10/15 1145  ceFAZolin (ANCEF) IVPB 2 g/50 mL premix     2 g 100 mL/hr over 30 Minutes Intravenous To [facility] 09/10/15 1134 09/10/15 1203   09/10/15 1131  ceFAZolin (ANCEF) 2-3 GM-% IVPB SOLR    Comments:  Edwina BarthHawks, Pamela   : cabinet override      09/10/15 1131 09/10/15 2344   09/07/15 1645  ceFAZolin (ANCEF) IVPB 2 g/50 mL premix     2 g 100 mL/hr over 30 Minutes Intravenous To Radiology 09/06/15 1642 09/07/15 0942   09/06/15 1400  ceFAZolin  (ANCEF) IVPB 2 g/50 mL premix  Status:  Discontinued     2 g 100 mL/hr over 30 Minutes Intravenous To Radiology 09/06/15 1004 09/06/15 1642   09/04/15 1000  amoxicillin-clavulanate (AUGMENTIN) 500-125 MG per tablet 500 mg     1 tablet Oral Every 24 hours 09/03/15 1653 09/08/15 1310   09/03/15 2200  amoxicillin-clavulanate (AUGMENTIN) 500-125 MG per tablet 500 mg  Status:  Discontinued     1 tablet Oral Every 12 hours 09/03/15 1639 09/03/15 1653       V. Charlena CrossWells Brabham IV, M.D. [facility] [place] Office: [phone] Pager:  [phone]

## 2015-09-11 NOTE — Progress Notes (Signed)
Subjective: Interval History: has no complaint .  Objective: Vital signs in last 24 hours: Temp:  [97.7 F (36.5 C)-98.3 F (36.8 C)] 97.8 F (36.6 C) (11/26 0838) Pulse Rate:  [65-78] 71 (11/26 0838) Resp:  [17-28] 18 (11/26 0838) BP: (123-158)/(52-92) 138/61 mmHg (11/26 0838) SpO2:  [91 %-100 %] 95 % (11/26 0838) Weight:  [86.2 kg (190 lb 0.6 oz)-88.3 kg (194 lb 10.7 oz)] 86.2 kg (190 lb 0.6 oz) (11/26 0053) Weight change: 3.8 kg (8 lb 6 oz)  Intake/Output from previous day: 11/25 0701 - 11/26 0700 In: 2365.1 [P.O.:730; I.V.:1635.1] Out: 2251 [Urine:250; Stool:1] Intake/Output this shift: Total I/O In: 120 [P.O.:120] Out: 250 [Urine:250]  General appearance: cooperative, moderately obese and slowed mentation Resp: diminished breath sounds bilaterally and rales bibasilar Chest wall: RIJ cath Cardio: S1, S2 normal and systolic murmur: holosystolic 2/6, blowing at apex GI: pos bs, liver down 5 cm Extremities: edema 1+ and AVF LUA  Lab Results:  Recent Labs  09/09/15 0227 09/10/15 0239  WBC 6.6 7.8  HGB 8.7* 8.2*  HCT 27.6* 26.4*  PLT 59* 65*   BMET:  Recent Labs  09/09/15 0227 09/10/15 0239  NA 137 138  K 4.8 4.5  CL 102 106  CO2 26 24  GLUCOSE 300* 286*  BUN 75* 85*  CREATININE 4.00* 4.35*  CALCIUM 8.7* 9.0   No results for input(s): PTH in the last 72 hours. Iron Studies: No results for input(s): IRON, TIBC, TRANSFERRIN, FERRITIN in the last 72 hours.  Studies/Results: No results found.  I have reviewed the patient's current medications.  Assessment/Plan: 1 ESRD HD last pm, did well vol and solute better. CLIP pending 2 Anemia  On esa/Fe 3 HPTH vit D 4 DM controlle 5 low ptlt HIT pending suspect other 6 Afib P HD on Mon, CLIP, esa/Fe, vit D    LOS: 7 days   Jules Vidovich L 09/11/2015,11:15 AM

## 2015-09-11 NOTE — Progress Notes (Signed)
ANTICOAGULATION CONSULT NOTE - Follow Up Consult  Pharmacy Consult for argatroban Indication: r/o HIT  Labs:  Recent Labs  09/09/15 0227  09/09/15 1936 09/10/15 0239 09/11/15 0527  HGB 8.7*  --   --  8.2*  --   HCT 27.6*  --   --  26.4*  --   PLT 59*  --   --  65*  --   APTT 117*  < > 88* 86* 52*  LABPROT 28.6*  --   --  23.3* 18.5*  INR 2.75*  --   --  2.09* 1.53*  CREATININE 4.00*  --   --  4.35*  --   < > = values in this interval not displayed.  Estimated Creatinine Clearance: 15.5 mL/min (by C-G formula based on Cr of 4.35).  Assessment: 79 yo male with possible HIT for argatroban  Goal of Therapy:  APTT 50-90 Monitor platelets by anticoagulation protocol: Yes   Plan:  Continue argatroban at current rate for now F/U with MD for anticoagulation plans  Geannie RisenGreg Joellyn Grandt, PharmD, BCPS   09/11/2015 6:17 AM

## 2015-09-12 LAB — GLUCOSE, CAPILLARY
GLUCOSE-CAPILLARY: 222 mg/dL — AB (ref 65–99)
GLUCOSE-CAPILLARY: 213 mg/dL — AB (ref 65–99)
Glucose-Capillary: 257 mg/dL — ABNORMAL HIGH (ref 65–99)
Glucose-Capillary: 355 mg/dL — ABNORMAL HIGH (ref 65–99)

## 2015-09-12 LAB — PROTIME-INR
INR: 2.29 — ABNORMAL HIGH (ref 0.00–1.49)
PROTHROMBIN TIME: 25 s — AB (ref 11.6–15.2)

## 2015-09-12 LAB — CBC
HCT: 27.9 % — ABNORMAL LOW (ref 39.0–52.0)
Hemoglobin: 8.7 g/dL — ABNORMAL LOW (ref 13.0–17.0)
MCH: 30.3 pg (ref 26.0–34.0)
MCHC: 31.2 g/dL (ref 30.0–36.0)
MCV: 97.2 fL (ref 78.0–100.0)
PLATELETS: 111 10*3/uL — AB (ref 150–400)
RBC: 2.87 MIL/uL — AB (ref 4.22–5.81)
RDW: 16.8 % — ABNORMAL HIGH (ref 11.5–15.5)
WBC: 7.6 10*3/uL (ref 4.0–10.5)

## 2015-09-12 LAB — APTT: aPTT: 101 seconds — ABNORMAL HIGH (ref 24–37)

## 2015-09-12 MED ORDER — WARFARIN SODIUM 5 MG PO TABS
5.0000 mg | ORAL_TABLET | Freq: Every day | ORAL | Status: DC
  Filled 2015-09-12: qty 1

## 2015-09-12 MED ORDER — WARFARIN - PHARMACIST DOSING INPATIENT: Freq: Every day | Status: DC

## 2015-09-12 MED ORDER — CARVEDILOL 6.25 MG PO TABS
6.2500 mg | ORAL_TABLET | Freq: Two times a day (BID) | ORAL | Status: DC
  Administered 2015-09-12 – 2015-09-13 (×3): 6.25 mg via ORAL
  Filled 2015-09-12 (×3): qty 1

## 2015-09-12 MED ORDER — WARFARIN SODIUM 5 MG PO TABS
5.0000 mg | ORAL_TABLET | Freq: Once | ORAL | Status: AC
  Administered 2015-09-12: 5 mg via ORAL

## 2015-09-12 NOTE — Progress Notes (Signed)
Subjective: Interval History: has no complaint .  Objective: Vital signs in last 24 hours: Temp:  [97.7 F (36.5 C)-98.1 F (36.7 C)] 98 F (36.7 C) (11/27 0500) Pulse Rate:  [70-78] 72 (11/27 0500) Resp:  [18] 18 (11/27 0500) BP: (121-150)/(43-68) 131/60 mmHg (11/27 0500) SpO2:  [91 %-100 %] 94 % (11/27 0500) Weight:  [85.095 kg (187 lb 9.6 oz)] 85.095 kg (187 lb 9.6 oz) (11/27 0500) Weight change: -3.205 kg (-7 lb 1.1 oz)  Intake/Output from previous day: 11/26 0701 - 11/27 0700 In: 560 [P.O.:560] Out: 400 [Urine:400] Intake/Output this shift: Total I/O In: 480 [P.O.:480] Out: -   General appearance: cooperative, moderately obese and slowed mentation Resp: diminished breath sounds bilaterally Chest wall: RIJ PC Cardio: S1, S2 normal and systolic murmur: holosystolic 2/6, blowing at apex GI: obese, pos bs,liver down 5 cm Extremities: edema 2+ and AVF LUA B&T  Lab Results:  Recent Labs  09/11/15 1140 09/12/15 0302  WBC 7.2 7.6  HGB 8.7* 8.7*  HCT 27.5* 27.9*  PLT 64* 111*   BMET:  Recent Labs  09/10/15 0239 09/11/15 1140  NA 138 137  K 4.5 4.0  CL 106 103  CO2 24 25  GLUCOSE 286* 222*  BUN 85* 68*  CREATININE 4.35* 3.51*  CALCIUM 9.0 8.4*   No results for input(s): PTH in the last 72 hours. Iron Studies: No results for input(s): IRON, TIBC, TRANSFERRIN, FERRITIN in the last 72 hours.  Studies/Results: No results found.  I have reviewed the patient's current medications.  Assessment/Plan: 1 ESRD for Hd. CLIP underway. Vol xs 2 HTNcontrolled 3 Anemia esa/Fe 4 HPTH vit D 5 Obesity 6 DM controlled 7 CAD 8 low ptlt improved. Cause not clear 9Debill P HD, esa, mobilize,vit D, CLIP    LOS: 8 days   Devorah Givhan L 09/12/2015,10:01 AM

## 2015-09-12 NOTE — Care Management Important Message (Signed)
Important Message  Patient Details  Name: [name] [name] MRN: [id number] Date of Birth: 10/01/1936   Medicare Important Message Given:  Waylan BogaYes    Retta Pitcher, RN 09/12/2015, 11:24 PM

## 2015-09-12 NOTE — Progress Notes (Signed)
[facility] Progress Note    Patient: [name] [name]    WUJ:[id number]  DOB: 1936-04-18     DOA: 09/03/2015 Date of Service: the patient was seen and examined on 09/12/2015  Brief Summary of Hospitalization:   79 y.o. ?  atrial fibrillation on Coumadin,  Diabetes ty ii,  hypertension , coronary artery disease+ ischemic cardiomyopathy and chronic kidney disease.  discharged from the hospital 11/16 three-day stay for HCAP and acute on chronic combined heart failure.  Patient was discharged home on Augmentin.   Patient really has not been out of bed since hospital discharge.  Found to have toxic metabolic encephalopathy 2/2 to med and rising creatinine Eventually started dialysis 11/22  Found to have low PLT count which is being worked up for HIIT vs Medication effect [crestor/allopurinol]  Consultants: Nephrology  Procedures: [facility] placement, hemodialysis Antibiotics: Augmentin 09/01/2015  start date end date 09/08/2015   SUBJ sitting in bed, had dialysis yesterday No pain in fistula No n/v tol diet Sitting in chair About to go for walk Nursing reports some dark urine which ha since resolved   Assessment and Plan:  1. Acute on chronic kidney failure (HCC)  worsening renal function with confusion. Nephrology  Consulted started hemodialysis as well as Lasix. The patient underwent [facility]  Placement 11/22 Vascular surgery did fistula placement on 09/10/15 -On Argatroban  Clip is pending Will probably need SNF-S/w to discuss and give options to family Has been OOB for ~ 4 hours to manage dialysis  2. Thrombocytopenia first noticed on 11/22 HIT was + 11/23-Get SRA as well to confirm Could also be 2/2 Allopurinol and Crestor-both which have been d/c'd PLT smear just showed TCP without morphological changes plt counts have improved therefore d/c Argatroban and re-start coumadin Check cbc in am   2. Metabolic acidosis Resolved with hemodialysis.  3.  Gout. Prednisone started on 11/19 and improving significantly. -d/c'd on 11/27 Start on low-dose tramadol. Allopurinol d/c 11/23  4. acute encephalopathy. Patient initially presented with confusion and acute encephalopathy which was thought to be secondary to narcotics. 2/2 to nephropathy secondary to uremia.  5  Hypothyroidism TSH within normal limit. Continue Synthroid 175 daily  6  Type II diabetes mellitus with renal manifestations (HCC) Holding oral hypoglycemic agent and placing the patient on sliding scale insulin. Sugars 213-222  7  Essential hypertension Slightly ?  blood pressure. Added back Coreg 12.5 bid  8  Atrial fibrillation CHad2Vasc2~ 4 Currently rate controlled on Coreg 12.5 bid INR was reversed for [facility] placement. Resume coumadin once PLT are above 75  SCD's for now continue argatroban for now as need AC  9  HCAP (healthcare-associated pneumonia) Patient had pneumonia last admission which is currently improving. Augmentin - Last dose 09/08/2015  DVT Prophylaxis: SCD Nutrition: Renal diet   Advance goals of care discussion: Full code  CALLED [phone]  Disposition:  Expected discharge date: 09/09/2015  Barriers to safe discharge: Needs CLIP for dialysis and has to be able to sit up for dialysis   Intake/Output Summary (Last 24 hours) at 09/12/15 1132 Last data filed at 09/12/15 1011  Gross per 24 hour  Intake    920 ml  Output    250 ml  Net    670 ml   Filed Weights   09/10/15 2143 09/11/15 0053 09/12/15 0500  Weight: 88.3 kg (194 lb 10.7 oz) 86.2 kg (190 lb 0.6 oz) 85.095 kg (187 lb 9.6 oz)    Objective: Physical Exam: Filed Vitals:  09/11/15 1421 09/11/15 1710 09/11/15 2047 09/12/15 0500  BP:  124/68 150/68 131/60  Pulse:  78 70 72  Temp:  97.7 F (36.5 C) 98.1 F (36.7 C) 98 F (36.7 C)  TempSrc:   Oral Oral  Resp:  Height:      Weight:    85.095 kg (187 lb 9.6 oz)  SpO2: 98% 94% 98% 94%   General: NAD Oral  Mucosa moist. Cardiovascular: S1 and S2 Present, no Murmur, JVD flat Respiratory: Bilateral Air entry present and  basal Crackles, no wheezes Abdomen: sfot nt nd Extremities: Bilateral  Pedal edema, no calf tenderness  Data Reviewed: CBC:  Recent Labs Lab 09/06/15 0348  09/08/15 0413 09/09/15 0227 09/10/15 0239 09/11/15 1140 09/12/15 0302  WBC 6.4  < > 5.6 6.6 7.8 7.2 7.6  NEUTROABS 5.0  --   --   --   --  5.6  --   HGB 8.9*  < > 8.5* 8.7* 8.2* 8.7* 8.7*  HCT 27.6*  < > 26.4* 27.6* 26.4* 27.5* 27.9*  MCV 92.0  < > 92.3 94.2 95.7 96.5 97.2  PLT 144*  < > 59* 59* 65* 64* 111*  < > = values in this interval not displayed. Basic Metabolic Panel:  Recent Labs Lab 09/07/15 0322 09/07/15 2017 09/08/15 0413 09/09/15 0227 09/10/15 0239 09/11/15 1140  NA 136 137 137 137 138 137  K 5.5* 4.4 4.3 4.8 4.5 4.0  CL 107 104 105 102 106 103  CO2 17* 21* 21* GLUCOSE 243* 300* 219* 300* 286* 222*  BUN 141* 95* 95* 75* 85* 68*  CREATININE 7.00* 4.93* 4.96* 4.00* 4.35* 3.51*  CALCIUM 8.8* 8.4* 8.5* 8.7* 9.0 8.4*  MG 2.2  --   --   --   --   --   PHOS  --  5.5* 5.3* 4.8*  --   --    Liver Function Tests:  Recent Labs Lab 09/06/15 0348 09/07/15 2017 09/08/15 0413 09/09/15 0227  AST 50*  --   --   --   ALT 39  --   --   --   ALKPHOS 55  --   --   --   BILITOT 0.5  --   --   --   PROT 6.2*  --   --   --   ALBUMIN 2.6* 2.7* 2.5* 2.5*   No results for input(s): LIPASE, AMYLASE in the last 168 hours. No results for input(s): AMMONIA in the last 168 hours.  Cardiac Enzymes: No results for input(s): CKTOTAL, CKMB, CKMBINDEX, TROPONINI in the last 168 hours. BNP (last 3 results)  Recent Labs  12/10/14 0530 08/29/15 1235 09/03/15 1356  BNP 326.5* 1483.1* 1191.8*    ProBNP (last 3 results) No results for input(s): PROBNP in the last 8760 hours.   CBG:  Recent Labs Lab 09/11/15 1121 09/11/15 1624 09/11/15 2134 09/12/15 0702 09/12/15 1114  GLUCAP 199*  291* 325* 222* 213*    No results found for this or any previous visit (from the past 240 hour(s)).   Studies: No results found.   Scheduled Meds: . calcitRIOL  0.5 mcg Oral Q48H  . calcium acetate  667 mg Oral TID WC  . carvedilol  6.25 mg Oral BID WC  . [START ON 09/14/2015] darbepoetin (ARANESP) injection - DIALYSIS  200 mcg Intravenous Q Tue-HD  . docusate sodium  100 mg Oral BID  . ferric gluconate (FERRLECIT/NULECIT) IV  125 mg  Intravenous Daily  . insulin aspart  0-5 Units Subcutaneous QHS  . insulin aspart  0-9 Units Subcutaneous TID WC  . isosorbide mononitrate  60 mg Oral QHS  . levothyroxine  175 mcg Oral Daily  . multivitamin  1 tablet Oral QHS  . olopatadine  1 drop Both Eyes Daily  . predniSONE  20 mg Oral Q breakfast  . sodium chloride  3 mL Intravenous Q12H  . sodium chloride  3 mL Intravenous Q12H  . tamsulosin  0.4 mg Oral Daily  . warfarin  5 mg Oral Daily  . Warfarin - Pharmacist Dosing Inpatient   Does not apply q1800   Continuous Infusions: . sodium chloride 10 mL/hr at 09/10/15 1039    Time spent: 30 minutes   Pleas KochJai Laverna Dossett, MD [facility] [phone]) [phone]

## 2015-09-12 NOTE — Progress Notes (Signed)
ANTICOAGULATION CONSULT NOTE - Follow Up Consult  Pharmacy Consult for coumadin Indication: atrial fibrillation  Allergies  Allergen Reactions  . Tomato Other (See Comments)    Too much acid - cannot take due to kidney issues    Patient Measurements: Height: 5\' 9"  (175.3 cm) Weight: 187 lb 9.6 oz (85.095 kg) IBW/kg (Calculated) : 70.7 Heparin Dosing Weight:   Vital Signs: Temp: 98 F (36.7 C) (11/27 0500) Temp Source: Oral (11/27 0500) BP: 131/60 mmHg (11/27 0500) Pulse Rate: 76 (11/27 1200)  Labs:  Recent Labs  09/10/15 0239 09/11/15 0527 09/11/15 1140 09/11/15 1530 09/12/15 0302  HGB 8.2*  --  8.7*  --  8.7*  HCT 26.4*  --  27.5*  --  27.9*  PLT 65*  --  64*  --  111*  APTT 86* 52*  --  51*  --   LABPROT 23.3* 18.5*  --   --  25.0*  INR 2.09* 1.53*  --   --  2.29*  CREATININE 4.35*  --  3.51*  --   --     Estimated Creatinine Clearance: 19.1 mL/min (by C-G formula based on Cr of 3.51).   Medications:  Scheduled:  . calcitRIOL  0.5 mcg Oral Q48H  . calcium acetate  667 mg Oral TID WC  . carvedilol  6.25 mg Oral BID WC  . [START ON 09/14/2015] darbepoetin (ARANESP) injection - DIALYSIS  200 mcg Intravenous Q Tue-HD  . docusate sodium  100 mg Oral BID  . ferric gluconate (FERRLECIT/NULECIT) IV  125 mg Intravenous Daily  . insulin aspart  0-5 Units Subcutaneous QHS  . insulin aspart  0-9 Units Subcutaneous TID WC  . isosorbide mononitrate  60 mg Oral QHS  . levothyroxine  175 mcg Oral Daily  . multivitamin  1 tablet Oral QHS  . olopatadine  1 drop Both Eyes Daily  . sodium chloride  3 mL Intravenous Q12H  . sodium chloride  3 mL Intravenous Q12H  . tamsulosin  0.4 mg Oral Daily  . Warfarin - Pharmacist Dosing Inpatient   Does not apply q1800   Infusions:  . sodium chloride 10 mL/hr at 09/10/15 1039    Assessment: 79 yo male with afib will be resumed on coumadin.  Argatroban was discontinued and team doesn't want to overlap with argatroban due to  bleeding risk.  INR today is 2.29 from 1.53, which was falsely elevated by argatroban.  Goal of Therapy:  INR 2-3 Monitor platelets by anticoagulation protocol: Yes   Plan:  - coumadin 5 mg po x1 - INR in am  Venetta Knee, Tsz-Yin 09/12/2015,12:03 PM

## 2015-09-13 ENCOUNTER — Telehealth: Payer: Self-pay | Admitting: Surgery

## 2015-09-13 ENCOUNTER — Encounter (HOSPITAL_COMMUNITY): Payer: Self-pay | Admitting: Surgery

## 2015-09-13 LAB — COMPREHENSIVE METABOLIC PANEL
ALT: 39 U/L (ref 17–63)
ANION GAP: 10 (ref 5–15)
AST: 61 U/L — ABNORMAL HIGH (ref 15–41)
Albumin: 2.6 g/dL — ABNORMAL LOW (ref 3.5–5.0)
Alkaline Phosphatase: 68 U/L (ref 38–126)
BILIRUBIN TOTAL: 0.5 mg/dL (ref 0.3–1.2)
BUN: 101 mg/dL — AB (ref 6–20)
CO2: 24 mmol/L (ref 22–32)
Calcium: 9.2 mg/dL (ref 8.9–10.3)
Chloride: 104 mmol/L (ref 101–111)
Creatinine, Ser: 4.29 mg/dL — ABNORMAL HIGH (ref 0.61–1.24)
GFR calc Af Amer: 14 mL/min — ABNORMAL LOW (ref >60)
GFR, EST NON AFRICAN AMERICAN: 12 mL/min — AB (ref >60)
Glucose, Bld: 211 mg/dL — ABNORMAL HIGH (ref 65–99)
POTASSIUM: 4 mmol/L (ref 3.5–5.1)
Sodium: 138 mmol/L (ref 135–145)
TOTAL PROTEIN: 5.4 g/dL — AB (ref 6.5–8.1)

## 2015-09-13 LAB — CBC WITH DIFFERENTIAL/PLATELET
Basophils Absolute: 0 10*3/uL (ref 0.0–0.1)
Basophils Relative: 0 %
EOS PCT: 3 %
Eosinophils Absolute: 0.2 10*3/uL (ref 0.0–0.7)
HCT: 27.8 % — ABNORMAL LOW (ref 39.0–52.0)
Hemoglobin: 8.7 g/dL — ABNORMAL LOW (ref 13.0–17.0)
LYMPHS ABS: 0.7 10*3/uL (ref 0.7–4.0)
LYMPHS PCT: 8 %
MCH: 30.3 pg (ref 26.0–34.0)
MCHC: 31.3 g/dL (ref 30.0–36.0)
MCV: 96.9 fL (ref 78.0–100.0)
MONO ABS: 0.6 10*3/uL (ref 0.1–1.0)
Monocytes Relative: 8 %
Neutro Abs: 6.5 10*3/uL (ref 1.7–7.7)
Neutrophils Relative %: 81 %
PLATELETS: 114 10*3/uL — AB (ref 150–400)
RBC: 2.87 MIL/uL — ABNORMAL LOW (ref 4.22–5.81)
RDW: 17.6 % — AB (ref 11.5–15.5)
WBC: 8 10*3/uL (ref 4.0–10.5)

## 2015-09-13 LAB — GLUCOSE, CAPILLARY
GLUCOSE-CAPILLARY: 119 mg/dL — AB (ref 65–99)
Glucose-Capillary: 183 mg/dL — ABNORMAL HIGH (ref 65–99)

## 2015-09-13 LAB — PROTIME-INR
INR: 1.63 — ABNORMAL HIGH (ref 0.00–1.49)
Prothrombin Time: 19.4 seconds — ABNORMAL HIGH (ref 11.6–15.2)

## 2015-09-13 LAB — PHOSPHORUS: Phosphorus: 4.7 mg/dL — ABNORMAL HIGH (ref 2.5–4.6)

## 2015-09-13 LAB — MAGNESIUM: MAGNESIUM: 1.9 mg/dL (ref 1.7–2.4)

## 2015-09-13 MED ORDER — WARFARIN SODIUM 5 MG PO TABS
5.0000 mg | ORAL_TABLET | Freq: Once | ORAL | Status: AC
  Administered 2015-09-13: 5 mg via ORAL
  Filled 2015-09-13: qty 1

## 2015-09-13 MED ORDER — CALCITRIOL 0.5 MCG PO CAPS
ORAL_CAPSULE | ORAL | Status: AC
  Filled 2015-09-13: qty 1

## 2015-09-13 MED ORDER — CALCIUM ACETATE (PHOS BINDER) 667 MG PO CAPS: 667.0000 mg | ORAL_CAPSULE | Freq: Three times a day (TID) | ORAL | Status: DC

## 2015-09-13 MED ORDER — TRAMADOL HCL 50 MG PO TABS: 25.0000 mg | ORAL_TABLET | Freq: Four times a day (QID) | ORAL | Status: DC | PRN

## 2015-09-13 NOTE — Discharge Summary (Addendum)
Physician Discharge Summary  [name] [name] WIO:[id number] DOB: December 24, 1935 DOA: 09/03/2015  PCP: Garret Reddish, MD  Admit date: 09/03/2015 Discharge date: 09/13/2015  Time spent: 35 minutes  Recommendations for Outpatient Follow-up:  1. Patient will need dialysis as an outpatient  2. will discharge home with home health physical therapy, occupational therapy 3. recommend labs be drawn at next dialysis session 4. Will need to check on INR for his A. fib as well in about for 5 days as transiently off of Coumadin for procedure and restarted on Coumadin on discharge 5. He will need close monitoring of platelet counts as he had some thrombocytopenia during hospital stay which was felt to be 2/2 to Crestor as well as allopurinol and we would not use these  medications going forward   Discharge Diagnoses:  Principal Problem:   Acute on chronic kidney failure ([place]) Active Problems:   Hypothyroidism   Type II diabetes mellitus with renal manifestations ([place])   GOUT   Essential hypertension   Chronic renal disease, stage IV (HCC)   Atrial fibrillation ([place])   HCAP (healthcare-associated pneumonia)   Weakness   Discharge Condition: fair  Diet recommendation: low salt fluid restricted  Filed Weights   09/11/15 0053 09/12/15 0500 09/13/15 0441  Weight: 86.2 kg (190 lb 0.6 oz) 85.095 kg (187 lb 9.6 oz) 85.4 kg (188 lb 4.4 oz)    History of present illness:  79 y.o. ?  atrial fibrillation on Coumadin,  Diabetes ty ii, hypertension , coronary artery disease+ ischemic cardiomyopathy and chronic kidney disease.  discharged from the hospital 11/16 three-day stay for HCAP and acute on chronic combined heart failure.  Patient was discharged home on Augmentin.  Patient really has not been out of bed since hospital discharge.  Found to have toxic metabolic encephalopathy 2/2 to med and rising creatinine He did not have any appearance of heart failure on admission Eventually  started dialysis 11/22  Found to have low PLT count which is being worked up for HIIT vs Medication effect [crestor/allopurinol]  Hospital Course:    1. Acute on chronic kidney failure (HCC) worsening renal function with confusion. Nephrology Consulted started hemodialysis as well as Lasix. The patient underwent [facility] Placement 11/22 Vascular surgery did fistula placement on 09/10/15 Clip to facility is in process and patient will  need SNF-S/w to discuss and give options to family Has been OOB for ~ 4 hours to manage dialysis  2. Thrombocytopenia first noticed on 11/22 HIT was + 11/23-Get SRA as well to confirm Could also be 2/2 Allopurinol and Crestor-both which have been d/c'd PLT smear just showed TCP without morphological changes plt counts have improved therefore d/c Argatroban and re-start coumadin Platelet count on discharge was  2. Metabolic acidosis Resolved with hemodialysis.  3. Gout. Prednisone started on 11/19 and improving significantly. -d/c'd on 11/27 Start on low-dose tramadol. Allopurinol d/c 11/23  4. acute encephalopathy. Patient initially presented with confusion and acute encephalopathy which was thought to be secondary to narcotics. 2/2 to nephropathy secondary to uremia.  5 Hypothyroidism TSH within normal limit. Continue Synthroid 175 daily  6 Type II diabetes mellitus with renal manifestations ([place]) During hospital stay was on insulin sliding scale coverage with moderate control and on discharge we'll resume 2.5-5 mg glipizide twice a day Sugars 183-211  7 Essential hypertension Slightly ? blood pressure. Added back Coreg 12.5 bid  8 Atrial fibrillation CHad2Vasc2~ 4 Currently rate controlled on Coreg 12.5 bid INR was reversed for [facility] placement.  on  discharge will Resume coumadin  As thrombocytopenia has resolved  No need for therapeutic bridging as this is rate controlled A. fib without any valvular anomaly  9 HCAP  (healthcare-associated pneumonia) Patient had pneumonia last admission which is currently improving. Augmentin - Last dose 09/08/2015  Consultations:  nephrology   Discharge Exam: Filed Vitals:   09/12/15 2045 09/13/15 0753  BP: 151/76 139/80  Pulse: 76 69  Temp: 98.3 F (36.8 C)   Resp: 18     General:  EOMI NCAT seen on dialysis unit Cardiovascular: S1-S2 no murmur rub or gallop:  Chest clinically clear no added sound No lower extremity edema Coherent and alert  Discharge Instructions    Current Discharge Medication List    START taking these medications   Details  calcium acetate (PHOSLO) 667 MG capsule Take 1 capsule (667 mg total) by mouth 3 (three) times daily with meals. Qty: 30 capsule, Refills: 0    traMADol (ULTRAM) 50 MG tablet Take 0.5 tablets (25 mg total) by mouth every 6 (six) hours as needed for severe pain. Qty: 30 tablet, Refills: 0      CONTINUE these medications which have NOT CHANGED   Details  acetaminophen (TYLENOL) 500 MG tablet Take 500 mg by mouth 2 (two) times daily as needed (pain).     amLODipine (NORVASC) 10 MG tablet Take 10 mg by mouth daily. Refills: 4    calcitRIOL (ROCALTROL) 0.25 MCG capsule Take 0.25-0.5 mcg by mouth daily. Take 1 tablet ( 0.25mcg) on odd days and  2 tablets (0.73mcg) on even days as directed    carvedilol (COREG) 12.5 MG tablet Take 1 tablet (12.5 mg total) by mouth 2 (two) times daily with a meal. Qty: 60 tablet, Refills: 11    colchicine 0.6 MG tablet Take 0.5 tablets (0.3 mg total) by mouth daily. Qty: 45 tablet, Refills: 0    glipiZIDE (GLUCOTROL) 5 MG tablet Take 2.5-5 mg by mouth 2 (two) times daily before a meal. Take 1/2 tablet (2.5 mg) daily before breakfast and 1 tablet (5 mg) before supper Refills: 3    hydrALAZINE (APRESOLINE) 50 MG tablet Take 1 tablet (50 mg total) by mouth 3 (three) times daily. Qty: 90 tablet, Refills: 3    isosorbide mononitrate (IMDUR) 60 MG 24 hr tablet Take 1 tablet  (60 mg total) by mouth daily. Qty: 30 tablet, Refills: 6    levothyroxine (SYNTHROID, LEVOTHROID) 175 MCG tablet Take 1 tablet (175 mcg total) by mouth daily. Qty: 30 tablet, Refills: 5    nitroGLYCERIN (NITROSTAT) 0.4 MG SL tablet Place 1 tablet (0.4 mg total) under the tongue every 5 (five) minutes as needed for chest pain. Qty: 25 tablet, Refills: 3   Associated Diagnoses: CAD (coronary artery disease)    olopatadine (PATANOL) 0.1 % ophthalmic solution Place 1 drop into both eyes 2 (two) times daily. Qty: 5 mL, Refills: 3    tamsulosin (FLOMAX) 0.4 MG CAPS capsule Take 1 capsule (0.4 mg total) by mouth daily. Qty: 30 capsule, Refills: 0    warfarin (COUMADIN) 5 MG tablet Take 1 tablet (5 mg total) by mouth daily. Qty: 30 tablet, Refills: 0      STOP taking these medications     amoxicillin-clavulanate (AUGMENTIN) 500-125 MG tablet      Cod Liver Oil OIL      furosemide (LASIX) 40 MG tablet      Magnesium 250 MG TABS      rosuvastatin (CRESTOR) 20 MG tablet  Allergies  Allergen Reactions  . Tomato Other (See Comments)    Too much acid - cannot take due to kidney issues   Follow-up Information    Follow up with Garret Reddish, MD.   Specialty:  Family Medicine   Contact information:   [address] [place] [address] [phone]       Follow up with [facility].   Why:  They will continue to do your home health care at your home   Contact information:   [address] [place] [address] [phone]        The results of significant diagnostics from this hospitalization (including imaging, microbiology, ancillary and laboratory) are listed below for reference.    Significant Diagnostic Studies: Dg Chest 2 View  09/03/2015  CLINICAL DATA:  Pt was recently hospitalized for PNA, was released Wed. and has since felt "tired/fatiqued", stated no chest complaints EXAM: CHEST  2 VIEW COMPARISON:  08/29/2015  FINDINGS: Mild decrease in the airspace opacity noted in the right middle lobe on the prior study, with no new areas of lung consolidation. Lungs are mildly hyperexpanded. No convincing pulmonary edema. No pleural effusion or pneumothorax. Heart is mildly enlarged. No mediastinal or hilar masses or convincing adenopathy. Bony thorax is demineralized but intact. IMPRESSION: Mild improvement in the right middle lobe pneumonia since the prior study. No other change. No new abnormality. Electronically Signed   By: Lajean Manes M.D.   On: 09/03/2015 14:18   Dg Chest 2 View  08/29/2015  CLINICAL DATA:  Acute shortness of breath, history CHF EXAM: CHEST  2 VIEW COMPARISON:  07/22/2015 FINDINGS: Dense consolidative opacity in the right middle lobe obscures the right cardiac border compatible with pneumonia or partial collapse. Heart is enlarged. Chronic vascular congestion noted without definite CHF. No effusion or pneumothorax. Trachea midline. Atherosclerosis noted of the aorta. IMPRESSION: Right middle lobe dense consolidative opacity compatible with partial collapse versus consolidative pneumonia. Cardiomegaly with chronic vascular congestion Recommend radiographic follow-up after medical therapy to document resolution. Electronically Signed   By: Jerilynn Mages.  Shick M.D.   On: 08/29/2015 12:08   Ir Fluoro Guide Cv Line Right  09/07/2015  INDICATION: 79 year old male with a end-stage renal disease now requiring hemodialysis. EXAM: TUNNELED CENTRAL VENOUS HEMODIALYSIS CATHETER PLACEMENT WITH ULTRASOUND AND FLUOROSCOPIC GUIDANCE MEDICATIONS: Ancef 2 gm IV; The IV antibiotic was given in an appropriate time interval prior to skin puncture. CONTRAST:  None ANESTHESIA/SEDATION: Versed 1 mg IV; Fentanyl 50 mcg IV Total Moderate Sedation Time Twenty minutes. FLUOROSCOPY TIME:  54 seconds for a total of 29.7 mGy COMPLICATIONS: None immediate PROCEDURE: Informed written consent was obtained from the patient after a discussion of  the risks, benefits, and alternatives to treatment. Questions regarding the procedure were encouraged and answered. The right neck and chest were prepped with chlorhexidine in a sterile fashion, and a sterile drape was applied covering the operative field. Maximum barrier sterile technique with sterile gowns and gloves were used for the procedure. A timeout was performed prior to the initiation of the procedure. After creating a small venotomy incision, a micropuncture kit was utilized to access the right internal jugular vein under direct, real-time ultrasound guidance after the overlying soft tissues were anesthetized with 1% lidocaine with epinephrine. Ultrasound image documentation was performed. The microwire was kinked to measure appropriate catheter length. A stiff glidewire was advanced to the level of the IVC and the micropuncture sheath was exchanged for a peel-away sheath. A Palindrome tunneled  hemodialysis catheter measuring 28 cm from tip to cuff was tunneled in a retrograde fashion from the anterior chest wall to the venotomy incision. The catheter was then placed through the peel-away sheath with tips ultimately positioned within the mid right atrium. Final catheter positioning was confirmed and documented with a spot radiographic image. The catheter aspirates and flushes normally. The catheter was flushed with appropriate volume heparin dwells. The catheter exit site was secured with a 0-Prolene retention suture. The venotomy incision was closed with an interrupted 4-0 Vicryl, and the epidermis sealed with Dermabond. Dressings were applied. The patient tolerated the procedure well without immediate post procedural complication. IMPRESSION: Successful placement of 28 cm tip to cuff tunneled hemodialysis catheter via the right internal jugular vein with tips terminating within the mid right atrium. The catheter is ready for immediate use. Signed, Criselda Peaches, MD [facility] Radiology Electronically Signed   By: Jacqulynn Cadet M.D.   On: 09/07/2015 10:29   Ir US Guide Vasc Access Right  09/07/2015  INDICATION: 79 year old male with a end-stage renal disease now requiring hemodialysis. EXAM: TUNNELED CENTRAL VENOUS HEMODIALYSIS CATHETER PLACEMENT WITH ULTRASOUND AND FLUOROSCOPIC GUIDANCE MEDICATIONS: Ancef 2 gm IV; The IV antibiotic was given in an appropriate time interval prior to skin puncture. CONTRAST:  None ANESTHESIA/SEDATION: Versed 1 mg IV; Fentanyl 50 mcg IV Total Moderate Sedation Time Twenty minutes. FLUOROSCOPY TIME:  54 seconds for a total of 36.6 mGy COMPLICATIONS: None immediate PROCEDURE: Informed written consent was obtained from the patient after a discussion of the risks, benefits, and alternatives to treatment. Questions regarding the procedure were encouraged and answered. The right neck and chest were prepped with chlorhexidine in a sterile fashion, and a sterile drape was applied covering the operative field. Maximum barrier sterile technique with sterile gowns and gloves were used for the procedure. A timeout was performed prior to the initiation of the procedure. After creating a small venotomy incision, a micropuncture kit was utilized to access the right internal jugular vein under direct, real-time ultrasound guidance after the overlying soft tissues were anesthetized with 1% lidocaine with epinephrine. Ultrasound image documentation was performed. The microwire was kinked to measure appropriate catheter length. A stiff glidewire was advanced to the level of the IVC and the micropuncture sheath was exchanged for a peel-away sheath. A Palindrome tunneled hemodialysis catheter measuring 28 cm from tip to cuff was tunneled in a retrograde fashion from the anterior chest wall to the venotomy incision. The catheter was then placed through the peel-away sheath with tips ultimately positioned within the mid right atrium. Final  catheter positioning was confirmed and documented with a spot radiographic image. The catheter aspirates and flushes normally. The catheter was flushed with appropriate volume heparin dwells. The catheter exit site was secured with a 0-Prolene retention suture. The venotomy incision was closed with an interrupted 4-0 Vicryl, and the epidermis sealed with Dermabond. Dressings were applied. The patient tolerated the procedure well without immediate post procedural complication. IMPRESSION: Successful placement of 28 cm tip to cuff tunneled hemodialysis catheter via the right internal jugular vein with tips terminating within the mid right atrium. The catheter is ready for immediate use. Signed, Criselda Peaches, MD [facility] Radiology Electronically Signed   By: Jacqulynn Cadet M.D.   On: 09/07/2015 10:29   Dg Chest Port 1 View  09/04/2015  CLINICAL DATA:  Patient with weakness.  On Coumadin. EXAM: PORTABLE CHEST 1 VIEW COMPARISON:  Chest radiograph 09/03/2015 FINDINGS: Monitoring leads overlie the patient. Stable marked cardiomegaly. No significant interval change bilateral mid and lower lung heterogeneous opacities. No large pleural effusion or pneumothorax. IMPRESSION: Stable cardiomegaly and heterogeneous airspace opacities within the bilateral mid and lower lungs, right-greater-than-left. Electronically Signed   By: Lovey Newcomer M.D.   On: 09/04/2015 13:08    Microbiology: No results found for this or any previous visit (from the past 240 hour(s)).   Labs: Basic Metabolic Panel:  Recent Labs Lab 09/07/15 0322 09/07/15 2017 09/08/15 0413 09/09/15 0227 09/10/15 0239 09/11/15 1140 09/13/15 1131  NA 136 137 137 137 138 137 138  K 5.5* 4.4 4.3 4.8 4.5 4.0 4.0  CL 107 104 105 102 106 103 104  CO2 17* 21* 21* $Remov'26 24 25 24  'XcwIoZ$ GLUCOSE 243* 300* 219* 300* 286* 222* 211*  BUN 141* 95* 95* 75* 85* 68* 101*  CREATININE 7.00* 4.93* 4.96* 4.00*  4.35* 3.51* 4.29*  CALCIUM 8.8* 8.4* 8.5* 8.7* 9.0 8.4* 9.2  MG 2.2  --   --   --   --   --  1.9  PHOS  --  5.5* 5.3* 4.8*  --   --  4.7*   Liver Function Tests:  Recent Labs Lab 09/07/15 2017 09/08/15 0413 09/09/15 0227 09/13/15 1131  AST  --   --   --  61*  ALT  --   --   --  39  ALKPHOS  --   --   --  68  BILITOT  --   --   --  0.5  PROT  --   --   --  5.4*  ALBUMIN 2.7* 2.5* 2.5* 2.6*   No results for input(s): LIPASE, AMYLASE in the last 168 hours. No results for input(s): AMMONIA in the last 168 hours. CBC:  Recent Labs Lab 09/09/15 0227 09/10/15 0239 09/11/15 1140 09/12/15 0302 09/13/15 0309  WBC 6.6 7.8 7.2 7.6 8.0  NEUTROABS  --   --  5.6  --  6.5  HGB 8.7* 8.2* 8.7* 8.7* 8.7*  HCT 27.6* 26.4* 27.5* 27.9* 27.8*  MCV 94.2 95.7 96.5 97.2 96.9  PLT 59* 65* 64* 111* 114*   Cardiac Enzymes: No results for input(s): CKTOTAL, CKMB, CKMBINDEX, TROPONINI in the last 168 hours. BNP: BNP (last 3 results)  Recent Labs  12/10/14 0530 08/29/15 1235 09/03/15 1356  BNP 326.5* 1483.1* 1191.8*    ProBNP (last 3 results) No results for input(s): PROBNP in the last 8760 hours.  CBG:  Recent Labs Lab 09/12/15 0702 09/12/15 1114 09/12/15 1655 09/12/15 2048 09/13/15 0607  GLUCAP 222* 213* 257* 355* 183*       Signed:  Nita Sells  [facility] 09/13/2015, 12:06 PM

## 2015-09-13 NOTE — Progress Notes (Signed)
Pt is gone to HD and will attempt to see later as time allows.  Samul Dadauth Hobart Marte, PT MS Acute Rehab Dept. Number: [facility] [id number] and [facility] [phone]

## 2015-09-13 NOTE — Progress Notes (Addendum)
Occupational Therapy Treatment Patient Details Name: [name] [name] MRN: [id number] DOB: 09/11/1936 Today's Date: 09/13/2015    History of present illness 79 y.o. male with Acute on chronic kidney failure, Metabolic acidosis, and gout, admitted with acute encephalopathy.   OT comments  Pt. Making gains with skilled OT.  Moving well with bed mobility and functional transfers.  Wife present and reports she is pleased with how he is moving this am.  Will continue to follow acutely.    Follow Up Recommendations  SNF;Other (comment)    Equipment Recommendations  Tub/shower bench    Recommendations for Other Services      Precautions / Restrictions Precautions Precautions: Fall       Mobility Bed Mobility Overal bed mobility: Modified Independent Bed Mobility: Rolling;Sidelying to Sit Rolling: Modified independent (Device/Increase time) Sidelying to sit: Modified independent (Device/Increase time)       General bed mobility comments: HOB flat, no rails, exited on left side of the bed, no physical assistance provided  Transfers Overall transfer level: Needs assistance Equipment used: None Transfers: Sit to/from UGI CorporationStand;Stand Pivot Transfers Sit to Stand: Supervision Stand pivot transfers: Supervision       General transfer comment: pts. family member requested stand pivot transfer without RW  to "see how he would do" indicating he would likely not use RW for short distance or transfers    Balance                                   ADL Overall ADL's : Needs assistance/impaired Eating/Feeding: Set up;Sitting                       Toilet Transfer: Supervision/safety;Stand-pivot Toilet Transfer Details (indicate cue type and reason): pt. performed simulated transfer eob to recliner.  wife present and states "lets do it without the walker to see how he does".  pt. able to properly reach for arm rests and pivot with not lob or inst. cues required for  sequencing Toileting- Clothing Manipulation and Hygiene: Min guard;Sit to/from stand Toileting - Clothing Manipulation Details (indicate cue type and reason): simulated during tx session     Functional mobility during ADLs: Supervision/safety General ADL Comments: wife present and reports pt. has been amb. to b.room in his room with limited assistance required.  states he is greatly improved from how he was at home.  both pt. and spouse report he walked over the weekend with nursing staff and family      Vision                     Perception     Praxis      Cognition   Behavior During Therapy: [facility] - Recovery Response CenterWFL for tasks assessed/performed Overall Cognitive Status: Within Functional Limits for tasks assessed                       Extremity/Trunk Assessment               Exercises     Shoulder Instructions       General Comments      Pertinent Vitals/ Pain       Pain Assessment: No/denies pain  Home Living  Prior Functioning/Environment              Frequency Min 2X/week     Progress Toward Goals  OT Goals(current goals can now be found in the care plan section)  Progress towards OT goals: Progressing toward goals     Plan Discharge plan remains appropriate    Co-evaluation                 End of Session Equipment Utilized During Treatment: Gait belt (pt. resistant. "oh and why do we need a safety belt".  reviewed policy and safety.  he reluctantly agreed)   Activity Tolerance Patient tolerated treatment well   Patient Left in chair;with chair alarm set;with family/visitor present (pt. would not allow call bell in reach "i dont need that".  asked wife if she leaves the room to please put it in his reach)   Nurse Communication Other (comment) (RN states ok to work with pt. secondary to he is not currently symptomatic even with recent v-tach.  )        Time: 1610-9604 OT Time  Calculation (min): 13 min  Charges: OT General Charges $OT Visit: 1 Procedure OT Treatments $Self Care/Home Management : 8-22 mins  Robet Leu, COTA/L 09/13/2015, 8:27 AM

## 2015-09-13 NOTE — Care Management Important Message (Signed)
Important Message  Patient Details  Name: [name] [name] MRN: [id number] Date of Birth: 07/19/1936   Medicare Important Message Given:  Yes    [name] 09/13/2015, 1:20 PM

## 2015-09-13 NOTE — Progress Notes (Signed)
Talked to patient with spouse present, patient wants to go to a SNF at discharge. [name] [name] Soc Worker made aware of disposition. Abelino DerrickB Tarri Guilfoil [facility] - Sioux FallsRN,MHA,BSN [phone]

## 2015-09-13 NOTE — Consult Note (Signed)
   [facility] CM Inpatient Consult   09/13/2015  [name] [name] [birth year] [id number] Met with the patient at bedside returning from hemodialysis.  Patient was evaluated for Bureau Management services.  Explained to the patient the goal and assistance of care management services post hospital follow up.  Patient states, "you can leave the information right here [on the over bed table] and my wife is on her way now."  Encouraged him to have wife to call after they review the information. Brochure and contact information given.  Patient endorses Dr. Garret Reddish as his primary care provider.  For questions, please contact: Natividad Brood, RN BSN [facility] Liaison  [phone] business mobile phone

## 2015-09-13 NOTE — Progress Notes (Signed)
ANTICOAGULATION CONSULT NOTE - Follow Up Consult  Pharmacy Consult for coumadin Indication: atrial fibrillation  Allergies  Allergen Reactions  . Tomato Other (See Comments)    Too much acid - cannot take due to kidney issues    Patient Measurements: Height: 5\' 9"  (175.3 cm) Weight: 188 lb 4.4 oz (85.4 kg) (scale b) IBW/kg (Calculated) : 70.7 Heparin Dosing Weight:   Vital Signs: BP: 139/80 mmHg (11/28 0753) Pulse Rate: 69 (11/28 0753)  Labs:  Recent Labs  09/11/15 0527  09/11/15 1140 09/11/15 1530 09/12/15 0302 09/12/15 1119 09/13/15 0309  HGB  --   < > 8.7*  --  8.7*  --  8.7*  HCT  --   --  27.5*  --  27.9*  --  27.8*  PLT  --   --  64*  --  111*  --  114*  APTT 52*  --   --  51*  --  101*  --   LABPROT 18.5*  --   --   --  25.0*  --  19.4*  INR 1.53*  --   --   --  2.29*  --  1.63*  CREATININE  --   --  3.51*  --   --   --   --   < > = values in this interval not displayed.  Estimated Creatinine Clearance: 19.1 mL/min (by C-G formula based on Cr of 3.51).   Medications:  Scheduled:  . calcitRIOL  0.5 mcg Oral Q48H  . calcium acetate  667 mg Oral TID WC  . carvedilol  6.25 mg Oral BID WC  . [START ON 09/14/2015] darbepoetin (ARANESP) injection - DIALYSIS  200 mcg Intravenous Q Tue-HD  . docusate sodium  100 mg Oral BID  . insulin aspart  0-5 Units Subcutaneous QHS  . insulin aspart  0-9 Units Subcutaneous TID WC  . isosorbide mononitrate  60 mg Oral QHS  . levothyroxine  175 mcg Oral Daily  . multivitamin  1 tablet Oral QHS  . olopatadine  1 drop Both Eyes Daily  . sodium chloride  3 mL Intravenous Q12H  . sodium chloride  3 mL Intravenous Q12H  . tamsulosin  0.4 mg Oral Daily  . Warfarin - Pharmacist Dosing Inpatient   Does not apply q1800   Infusions:  . sodium chloride 10 mL/hr at 09/10/15 1039    Assessment: 79 yo male with afib will be resumed on coumadin.  Argatroban was discontinued and team doesn't want to overlap with argatroban due to  bleeding risk.  INR trending up  Goal of Therapy:  INR 2-3 Monitor platelets by anticoagulation protocol: Yes   Plan:  Repeat Coumadin 5 mg po x1 Follow up AM INR  Thank you Okey RegalLisa Mersades Barbaro, PharmD [phone]  Elwin SleightPowell, Yanky Vanderburg Kay 09/13/2015,10:03 AM

## 2015-09-13 NOTE — Telephone Encounter (Addendum)
-----   Message from Sharee PimpleMarilyn K McChesney, RN sent at 09/11/2015 12:26 PM EST ----- Regarding: schedule   ----- Message -----    From: Nada LibmanVance W Brabham, MD    Sent: 09/10/2015   8:24 PM      To: [facility] Charge Pool  09/10/2015:  Surgeon:  Durene CalBrabham, [place] Assistants:  M.Collins Procedure:   Left brachio-cephalic fistula  F/u 6 with with duplex of fistula  notified patient's son of post op appt. on 11-15-15 11:30 lab and 12:15 with dr. Myra Gianottibrabham

## 2015-09-13 NOTE — Progress Notes (Signed)
New CSW order received yesterday to assist patient with SNF placement.  CSW met with patient's wife Ivin Booty and spoke to his daughter April and son Coralyn Mark via phone per wife's request.  Wife stated "we were told he 'had' to go to SNF so I didn't think we had a choice."  CSW discussed right for patient/family to make decisions re: d/c plan.  During CSW's discussion with son Coralyn Mark- he indicated that his sister would be staying with their parents at home and they would like to try home health PT, RN, and [facility] aide.  Should this not be sucessful- then they would seek SNF placement and would like [facility].  CSW will send SNF referral to [facility] for review in case son calls- however- he was notified that there is a 30 day "window" of opportunity for d/c to SNF after d/c from a hospital. He verbalized understanding of same.  Notified attending MD and unit RNCM of above; will plan d/c home this afternoon after dialysis.  Patient noted by PT to have been able to walk 80 feet on 09/10/15.  CSW will sign off. Lorie Phenix. Pauline Good, [place]

## 2015-09-13 NOTE — Progress Notes (Signed)
09/13/2015 1:57 PM Hemodialysis Outpatient Note; this patient has been accepted at the [facility] on [address] on a Monday, Wednesday and Friday 2nd shift schedule. Providing that permanent access has been placed the center can begin treatment Wednesday November 30 at 10:30AM. Thank you. Tilman NeatKrzywonos, Curren Mohrmann H

## 2015-09-13 NOTE — Progress Notes (Signed)
Patient is currently in dialysis; per [name] [name] Soc Worker patient changed his mind and [facility] - [facility] prior to admission for HHRN/ PT/ OT /nurses adie. Abelino DerrickB [name] [facility] LLCRN,MHA,BSN [phone]

## 2015-09-13 NOTE — Progress Notes (Signed)
Assessment/Plan: 1 ESRD for Hd. CLIP underway, awaiting assignment.  2 HTN controlled 3 Anemia esa/Fe 4 HPTH vit D 5 Obesity 6 DM controlled 7 CAD 8 Thrombocytopenia improved. Cause not clear 9Debill P HD today, esa, mobilize,vit D, CLIP  Subjective:  Interval History: Better with dialysis Objective: Vital signs in last 24 hours: Temp:  [97.9 F (36.6 [name])-98.3 F (36.8 [name])] 98.3 F (36.8 [name]) (11/27 2045) Pulse Rate:  [69-76] 69 (11/28 0753) Resp:  [18] 18 (11/27 2045) BP: (132-151)/(61-80) 139/80 mmHg (11/28 0753) SpO2:  [92 %-99 %] 97 % (11/27 2045) Weight:  [85.4 kg (188 lb 4.4 oz)] 85.4 kg (188 lb 4.4 oz) (11/28 0441) Weight change: 0.305 kg (10.8 oz)  Intake/Output from previous day: 11/27 0701 - 11/28 0700 In: 960 [P.O.:960] Out: 451 [Urine:450; Stool:1] Intake/Output this shift: Total I/O In: 360 [P.O.:360] Out: -   General appearance: alert and cooperative Resp: clear to auscultation bilaterally Chest wall: no tenderness Cardio: regular rate and rhythm, S1, S2 normal, no murmur, click, rub or gallop Extremities: edema 1+, AVF LUE  Lab Results:  Recent Labs  09/12/15 0302 09/13/15 0309  WBC 7.6 8.0  HGB 8.7* 8.7*  HCT 27.9* 27.8*  PLT 111* 114*   BMET:  Recent Labs  09/11/15 1140  NA 137  K 4.0  CL 103  CO2 25  GLUCOSE 222*  BUN 68*  CREATININE 3.51*  CALCIUM 8.4*   No results for input(s): PTH in the last 72 hours. Iron Studies: No results for input(s): IRON, TIBC, TRANSFERRIN, FERRITIN in the last 72 hours. Studies/Results: No results found.  Scheduled: . calcitRIOL  0.5 mcg Oral Q48H  . calcium acetate  667 mg Oral TID WC  . carvedilol  6.25 mg Oral BID WC  . [START ON 09/14/2015] darbepoetin (ARANESP) injection - DIALYSIS  200 mcg Intravenous Q Tue-HD  . docusate sodium  100 mg Oral BID  . insulin aspart  0-5 Units Subcutaneous QHS  . insulin aspart  0-9 Units Subcutaneous TID WC  . isosorbide mononitrate  60 mg Oral QHS  .  levothyroxine  175 mcg Oral Daily  . multivitamin  1 tablet Oral QHS  . olopatadine  1 drop Both Eyes Daily  . sodium chloride  3 mL Intravenous Q12H  . sodium chloride  3 mL Intravenous Q12H  . tamsulosin  0.4 mg Oral Daily  . Warfarin - Pharmacist Dosing Inpatient   Does not apply q1800     LOS: 9 days   [name] [name] 09/13/2015,9:54 AM

## 2015-09-13 NOTE — Progress Notes (Signed)
CCMD notified that pt had 9 beats of V-tach. Assessed the patient. Patient aysmptomatic and resting. BP 139/80 P 69. MD paged. Will continue to monitor.

## 2015-09-13 NOTE — Progress Notes (Signed)
Results for [name] [name], [name] [name] (MRN [id number]) as of 09/13/2015 13:01  Ref. Range 09/12/2015 07:02 09/12/2015 11:14 09/12/2015 16:55 09/12/2015 20:48 09/13/2015 06:07  Glucose-Capillary Latest Ref Range: 65-99 mg/dL 409222 (H) 811213 (H) 914257 (H) 355 (H) 183 (H)  Noted that blood sugars continue to be greater than 180 mg/dl. Recommend increasing Novolog correction scale to MODERATE TID & HS. Will continue to monitor blood sugars while in the hospital. Smith MinceKendra Chantavia Bazzle RN BSN CDE

## 2015-09-14 ENCOUNTER — Other Ambulatory Visit: Payer: Self-pay | Admitting: Family Medicine

## 2015-09-14 ENCOUNTER — Telehealth: Payer: Self-pay

## 2015-09-14 ENCOUNTER — Encounter: Payer: Self-pay | Admitting: Family Medicine

## 2015-09-14 LAB — SEROTONIN RELEASE ASSAY (SRA)
SRA .2 IU/mL UFH Ser-aCnc: 1 % (ref 0–20)
SRA .2 IU/mL UFH Ser-aCnc: 1 % (ref 0–20)
SRA 100IU/mL UFH Ser-aCnc: 1 % (ref 0–20)
SRA 100IU/mL UFH Ser-aCnc: <1 % (ref 0–20)

## 2015-09-14 NOTE — Telephone Encounter (Signed)
TCM was made

## 2015-09-14 NOTE — Telephone Encounter (Signed)
Transition Care Management Follow-up Telephone Call  How have you been since you were released from the hospital? im feeling really good    Do you understand why you were in the hospital? Yes   Do you understand the discharge instrcutions? Yes   Items Reviewed:  Medications reviewed: Yes  Allergies reviewed: Yes  Dietary changes reviewed: Yes   Referrals reviewed: Yes    Functional Questionnaire:   Activities of Daily Living (ADLs):   He states they are independent in the following: ambulation, bathing and hygiene, feeding, continence, grooming, toileting and dressing States they require assistance with the following: None    Any transportation issues/concerns?: no   Any patient concerns? no   Confirmed importance and date/time of follow-up visits scheduled: yes   Confirmed with patient if condition begins to worsen call PCP or go to the ER.  Patient was given the Call-a-Nurse line [phone]: Yes  Pt was discharge at home Pt was discharge 09/13/15  Pt has an appt 09/24/15 at 2:45

## 2015-09-14 NOTE — NC FL2 (Signed)
[place] MEDICAID FL2 LEVEL OF CARE SCREENING TOOL     IDENTIFICATION  Patient Name: [name] L ZEBEDIAH BEEZLEYrthdate: Jan 20, 1936 Sex: male Admission Date (Current Location): 09/03/2015  [facility] and [place] Number: Producer, television/film/video and Address:  [facility], [address] [place], [place] [address]      Provider Number: [id number]  Attending Physician Name and Address:  No att. providers found  Relative Name and Phone Number:   (April Feil [phone])    Current Level of Care: Hospital Recommended Level of Care: Skilled Nursing Facility Prior Approval Number:    Date Approved/Denied:   PASRR Number:  [id number]  Discharge Plan: SNF    Current Diagnoses: Patient Active Problem List   Diagnosis Date Noted  . Hypertension 09/03/2015  . Acute on chronic kidney failure (HCC) 09/03/2015  . Weakness 09/03/2015  . 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 HCAP (healthcare-associated pneumonia) 08/29/2015  . DOE (dyspnea on exertion) 08/29/2015  . Thrombocytopenia (HCC) 08/29/2015  . Long term (current) use of anticoagulants 08/03/2015  . Atrial fibrillation (HCC) 07/30/2015  . CAP (community acquired pneumonia) 07/23/2015  . Bladder outflow obstruction   . Acute combined systolic and diastolic congestive heart failure (HCC) 07/21/2015  . Ischemic cardiomyopathy 04/13/2011  . Osteoarthritis severe bilateral knees 04/06/2010  . LEG CRAMPS, IDIOPATHIC 04/06/2010  . Congestive heart failure (HCC) 09/07/2008  . BACK PAIN 03/20/2008  . Type II diabetes mellitus with renal manifestations (HCC) 10/31/2007  . Chronic renal disease, stage IV (HCC) 10/31/2007  . Coronary atherosclerosis 09/25/2007  . Hypothyroidism 04/09/2007  . Hyperlipidemia 04/09/2007  . GOUT 04/09/2007  . Essential hypertension 04/09/2007  . CEREBROVASCULAR ACCIDENT, HX OF 04/09/2007    Orientation ACTIVITIES/SOCIAL BLADDER RESPIRATION    Self, Time, Situation, Place  Passive Continent Normal  BEHAVIORAL SYMPTOMS/MOOD NEUROLOGICAL BOWEL NUTRITION STATUS      Continent Diet (carb modified)  PHYSICIAN VISITS COMMUNICATION OF NEEDS Height & Weight Skin  30 days Verbally  (175.3 cm) 182 lbs. Normal          AMBULATORY STATUS RESPIRATION    Supervision limited Normal      Personal Care Assistance Level of Assistance  Bathing, Feeding, Dressing Bathing Assistance: Independent Feeding assistance: Limited assistance Dressing Assistance: Independent      Functional Limitations Info  Sight, Hearing, Speech Sight Info: Adequate Hearing Info: Adequate Speech Info: Adequate       SPECIAL CARE FACTORS FREQUENCY  PT (By licensed PT), OT (By licensed OT)     PT Frequency: 5x OT Frequency: 2x           Additional Factors Info  Code Status Code Status Info: full Allergies Info: Tomato           Current Medications (09/14/2015):  This is the current hospital active medication list No current facility-administered medications for this encounter.   Current Outpatient Prescriptions  Medication Sig Dispense Refill  . acetaminophen (TYLENOL) 500 MG tablet Take 500 mg by mouth 2 (two) times daily as needed (pain).     Marland Kitchen amLODipine (NORVASC) 10 MG tablet Take 10 mg by mouth daily.  4  . calcitRIOL (ROCALTROL) 0.25 MCG capsule Take 0.25-0.5 mcg by mouth daily. Take 1 tablet ( 0.45mcg) on odd days and  2 tablets (0.63mcg) on even days as directed    . carvedilol (COREG) 12.5 MG tablet Take 1 tablet (12.5 mg total) by mouth 2 (two) times daily with a meal. 60 tablet 11  .  colchicine 0.6 MG tablet Take 0.5 tablets (0.3 mg total) by mouth daily. (Patient taking differently: Take 0.3 mg by mouth daily as needed (gout). ) 45 tablet 0  . glipiZIDE (GLUCOTROL) 5 MG tablet Take 2.5-5 mg by mouth 2 (two) times daily before a meal. Take 1/2 tablet (2.5 mg) daily before breakfast and 1 tablet (5 mg) before supper  3  . hydrALAZINE (APRESOLINE) 50 MG tablet  Take 1 tablet (50 mg total) by mouth 3 (three) times daily. 90 tablet 3  . levothyroxine (SYNTHROID, LEVOTHROID) 175 MCG tablet Take 1 tablet (175 mcg total) by mouth daily. 30 tablet 5  . nitroGLYCERIN (NITROSTAT) 0.4 MG SL tablet Place 1 tablet (0.4 mg total) under the tongue every 5 (five) minutes as needed for chest pain. 25 tablet 3  . olopatadine (PATANOL) 0.1 % ophthalmic solution Place 1 drop into both eyes 2 (two) times daily. (Patient taking differently: Place 1 drop into both eyes daily. ) 5 mL 3  . tamsulosin (FLOMAX) 0.4 MG CAPS capsule Take 1 capsule (0.4 mg total) by mouth daily. 30 capsule 0  . warfarin (COUMADIN) 5 MG tablet Take 1 tablet (5 mg total) by mouth daily. (Patient taking differently: Take 2.5-5 mg by mouth daily. Take 1 tablet (5 mg) by mouth Sunday, Tuesday, Thursday, take 1/2 tablet (2.5 mg) on Monday, Wednesday, Friday and Saturday) 30 tablet 0  . calcium acetate (PHOSLO) 667 MG capsule Take 1 capsule (667 mg total) by mouth 3 (three) times daily with meals. 30 capsule 0  . isosorbide mononitrate (IMDUR) 60 MG 24 hr tablet TAKE 1 TABLET BY MOUTH DAILY. 30 tablet 6  . traMADol (ULTRAM) 50 MG tablet Take 0.5 tablets (25 mg total) by mouth every 6 (six) hours as needed for severe pain. 30 tablet 0     Discharge Medications: Please see discharge summary for a list of discharge medications.  Relevant Imaging Results:  Relevant Lab Results:  Recent Labs    Additional Information SS# [id number]  Catheryn BaconCharlean Fallon Howerter  BSW intern  [phone]

## 2015-09-16 ENCOUNTER — Ambulatory Visit (INDEPENDENT_AMBULATORY_CARE_PROVIDER_SITE_OTHER): Payer: Medicare Other | Admitting: Pharmacist Clinician (PhC)/ Clinical Pharmacy Specialist

## 2015-09-16 ENCOUNTER — Other Ambulatory Visit: Payer: Self-pay | Admitting: Family Medicine

## 2015-09-16 DIAGNOSIS — Z7901 Long term (current) use of anticoagulants: Secondary | ICD-10-CM

## 2015-09-16 DIAGNOSIS — I4891 Unspecified atrial fibrillation: Secondary | ICD-10-CM

## 2015-09-16 LAB — POCT INR: INR: 1.4

## 2015-09-17 ENCOUNTER — Other Ambulatory Visit: Payer: Self-pay | Admitting: Family Medicine

## 2015-09-17 ENCOUNTER — Other Ambulatory Visit: Payer: Self-pay | Admitting: Pharmacist Clinician (PhC)/ Clinical Pharmacy Specialist

## 2015-09-17 MED ORDER — WARFARIN SODIUM 5 MG PO TABS: ORAL_TABLET | ORAL | Status: DC

## 2015-09-21 ENCOUNTER — Ambulatory Visit: Payer: Medicare Other | Admitting: Pharmacist Clinician (PhC)/ Clinical Pharmacy Specialist

## 2015-09-21 DIAGNOSIS — Z7901 Long term (current) use of anticoagulants: Secondary | ICD-10-CM

## 2015-09-21 DIAGNOSIS — I4891 Unspecified atrial fibrillation: Secondary | ICD-10-CM

## 2015-09-21 LAB — POCT INR: INR: 1.6

## 2015-09-23 ENCOUNTER — Telehealth: Payer: Self-pay | Admitting: Family Medicine

## 2015-09-23 NOTE — Telephone Encounter (Signed)
Windell MouldingRuth would like to extend OT services to twice a wk for 2 wks  starting  week of dec 12-17.

## 2015-09-23 NOTE — Telephone Encounter (Signed)
Lm on ruth vm providing VO.

## 2015-09-24 ENCOUNTER — Telehealth: Payer: Self-pay | Admitting: Family Medicine

## 2015-09-24 ENCOUNTER — Ambulatory Visit: Payer: Medicare Other | Admitting: Family Medicine

## 2015-09-24 NOTE — Telephone Encounter (Signed)
Jerilynn from [facility] called requesting Re-certification orders so [name] can continue having PT Home Health services after having been released from the hospital. She wants to go twice a week for four weeks. Please give her a phone call regarding this.  Jerilynn's ph# [phone] Thank you.

## 2015-09-24 NOTE — Telephone Encounter (Signed)
LM on pt vm with VO.

## 2015-09-28 ENCOUNTER — Ambulatory Visit (INDEPENDENT_AMBULATORY_CARE_PROVIDER_SITE_OTHER): Payer: Medicare Other | Admitting: Family Medicine

## 2015-09-28 ENCOUNTER — Encounter: Payer: Self-pay | Admitting: Family Medicine

## 2015-09-28 ENCOUNTER — Ambulatory Visit (INDEPENDENT_AMBULATORY_CARE_PROVIDER_SITE_OTHER): Payer: Medicare Other | Admitting: Pharmacist Clinician (PhC)/ Clinical Pharmacy Specialist

## 2015-09-28 VITALS — BP 102/58 | HR 80 | Temp 97.7°F | Wt 171.0 lb

## 2015-09-28 DIAGNOSIS — M109 Gout, unspecified: Secondary | ICD-10-CM | POA: Diagnosis not present

## 2015-09-28 DIAGNOSIS — Z992 Dependence on renal dialysis: Secondary | ICD-10-CM

## 2015-09-28 DIAGNOSIS — N186 End stage renal disease: Secondary | ICD-10-CM

## 2015-09-28 DIAGNOSIS — D696 Thrombocytopenia, unspecified: Secondary | ICD-10-CM

## 2015-09-28 DIAGNOSIS — I10 Essential (primary) hypertension: Secondary | ICD-10-CM

## 2015-09-28 DIAGNOSIS — I4891 Unspecified atrial fibrillation: Secondary | ICD-10-CM

## 2015-09-28 DIAGNOSIS — E785 Hyperlipidemia, unspecified: Secondary | ICD-10-CM

## 2015-09-28 DIAGNOSIS — J189 Pneumonia, unspecified organism: Secondary | ICD-10-CM

## 2015-09-28 DIAGNOSIS — I5041 Acute combined systolic (congestive) and diastolic (congestive) heart failure: Secondary | ICD-10-CM | POA: Diagnosis not present

## 2015-09-28 DIAGNOSIS — Z7901 Long term (current) use of anticoagulants: Secondary | ICD-10-CM

## 2015-09-28 LAB — POCT INR: INR: 1.8

## 2015-09-28 NOTE — Assessment & Plan Note (Signed)
S: hospitalized with toxic metabolic encephalopathy. TDC catheter emergently placed and dialysis started. Encephalopathy improved. Patient knew this was life or death decision and he chose life although previously had declined access on multiple occasions with Dr. Briant CedarMattingly.. Left arm fistula was placed during hospitalization as well. Originally patient to SNF but decided to go home with RN and PT.  A/P: continue MWF dialysis adn Dr. Briant CedarMattingly follow up.

## 2015-09-28 NOTE — Assessment & Plan Note (Signed)
S: controlled. On Amlodipine 5 mg BID, carvedilol 12.5 mg BID.  Lasix stopped when starting dialysis. Hydralazine stopped due to low BP BP Readings from Last 3 Encounters:  09/28/15 102/58  09/13/15 131/66  09/01/15 119/78  A/P:Continue current meds:  Permanently discontinued hydralazine - patient states he wanted my opinion. He may need further reduction in BP medicines- likely reduce amlodipine if remains <115/75.

## 2015-09-28 NOTE — Assessment & Plan Note (Signed)
S: resolved on course of antibiotics A/P: patient well appearing after completion and lungs clear- pneumonia has resolved.

## 2015-09-28 NOTE — Progress Notes (Signed)
Tana ConchStephen Danissa Rundle, MD  Subjective:  [name] ConradRoy L [name] is a 79 y.o. year old very pleasant male patient who presents for hospital follow up.    Importing hospital course but for changes/updates See problem oriented charting "History of present illness:  79 y.o. ?  atrial fibrillation on Coumadin,  Diabetes ty ii, hypertension , coronary artery disease+ ischemic cardiomyopathy and chronic kidney disease.  discharged from the hospital 11/16 three-day stay for HCAP and acute on chronic combined heart failure.  Patient was discharged home on Augmentin.  Patient really has not been out of bed since hospital discharge.  Found to have toxic metabolic encephalopathy 2/2 to med and rising creatinine He did not have any appearance of heart failure on admission Eventually started dialysis 11/22  Found to have low PLT count which is being worked up for HIIT vs Medication effect [crestor/allopurinol]  Hospital Course:    1. Acute on chronic kidney failure (HCC) worsening renal function with confusion. Nephrology Consulted started hemodialysis as well as Lasix. [facility] Vascular surgery did fistula placement on 09/10/15 Clip to facility is in process and patient will need SNF-S/w to discuss and give options to family Has been OOB for ~ 4 hours to manage dialysis  2. Thrombocytopenia first noticed on 11/22 HIT was + 11/23-Get SRA as well to confirm Could also be 2/2 Allopurinol and Crestor-both which have been d/c'd PLT smear just showed TCP without morphological changes plt counts have improved therefore d/c Argatroban and re-start coumadin Platelet count on discharge was  2. Metabolic acidosis Resolved with hemodialysis.  3. Gout. Prednisone started on 11/19 and improving significantly. -d/c'd on 11/27 Start on low-dose tramadol. Allopurinol d/c 11/23  4. acute encephalopathy. Patient initially presented with confusion and acute  encephalopathy which was thought to be secondary to narcotics. 2/2 to nephropathy secondary to uremia.  5 Hypothyroidism TSH within normal limit. Continue Synthroid 175 daily  6 Type II diabetes mellitus with renal manifestations (HCC) During hospital stay was on insulin sliding scale coverage with moderate control and on discharge we'll resume 2.5-5 mg glipizide twice a day Sugars 183-211  7 Essential hypertension Slightly ? blood pressure. Added back Coreg 12.5 bid  8 Atrial fibrillation CHad2Vasc2~ 4 Currently rate controlled on Coreg 12.5 bid [facility] on discharge will Resume coumadin As thrombocytopenia has resolved  No need for therapeutic bridging as this is rate controlled A. fib without any valvular anomaly  9 HCAP (healthcare-associated pneumonia) Patient had pneumonia last admission which is currently improving. Augmentin - Last dose 09/08/2015 " ROS- no fever, chills, shortness of breath, nausea, vomiting. Does have some fatigue in adjusting to dialysis  Past Medical History-  Patient Active Problem List   Diagnosis Date Noted  . Thrombocytopenia (HCC) 08/29/2015    Priority: High  . Atrial fibrillation (HCC) 07/30/2015    Priority: High  . Ischemic cardiomyopathy 04/13/2011    Priority: High  . Congestive heart failure (HCC) 09/07/2008    Priority: High  . Type II diabetes mellitus with renal manifestations (HCC) 10/31/2007    Priority: High  . ESRD on dialysis (HCC) 10/31/2007    Priority: High  . Coronary atherosclerosis 09/25/2007    Priority: High  . BACK PAIN 03/20/2008    Priority: Medium  . Hypothyroidism 04/09/2007    Priority: Medium  . Hyperlipidemia 04/09/2007    Priority: Medium  . GOUT 04/09/2007    Priority: Medium  . Essential hypertension 04/09/2007  Priority: Medium  . CEREBROVASCULAR ACCIDENT, HX OF 04/09/2007    Priority: Medium  . Long term (current) use of anticoagulants 08/03/2015     Priority: Low  . CAP (community acquired pneumonia) 07/23/2015    Priority: Low  . Osteoarthritis severe bilateral knees 04/06/2010    Priority: Low  . LEG CRAMPS, IDIOPATHIC 04/06/2010    Priority: Low  . Bladder outflow obstruction     Medications- reviewed and updated Current Outpatient Prescriptions  Medication Sig Dispense Refill  . amLODipine (NORVASC) 10 MG tablet Take 10 mg by mouth daily.  4  . calcitRIOL (ROCALTROL) 0.25 MCG capsule Take 0.25-0.5 mcg by mouth daily. Take 1 tablet ( 0.68mcg) on odd days and  2 tablets (0.69mcg) on even days as directed    . calcium acetate (PHOSLO) 667 MG capsule Take 1 capsule (667 mg total) by mouth 3 (three) times daily with meals. 30 capsule 0  . carvedilol (COREG) 12.5 MG tablet Take 1 tablet (12.5 mg total) by mouth 2 (two) times daily with a meal. 60 tablet 11  . glipiZIDE (GLUCOTROL) 5 MG tablet Take 2.5-5 mg by mouth 2 (two) times daily before a meal. Take 1/2 tablet (2.5 mg) daily before breakfast and 1 tablet (5 mg) before supper  3  . isosorbide mononitrate (IMDUR) 60 MG 24 hr tablet TAKE 1 TABLET BY MOUTH DAILY. 30 tablet 6  . levothyroxine (SYNTHROID, LEVOTHROID) 175 MCG tablet Take 1 tablet (175 mcg total) by mouth daily. 30 tablet 5  . olopatadine (PATANOL) 0.1 % ophthalmic solution Place 1 drop into both eyes 2 (two) times daily. (Patient taking differently: Place 1 drop into both eyes daily. ) 5 mL 3  . warfarin (COUMADIN) 5 MG tablet Take 1 tablet by mouth daily or as directed by coumadin clinic 30 tablet 2  . colchicine 0.6 MG tablet Take 0.6 mg by mouth. May take 1 tab every 2 weeks if having gout flare if ok with kidney doctor.  0  . nitroGLYCERIN (NITROSTAT) 0.4 MG SL tablet Place 1 tablet (0.4 mg total) under the tongue every 5 (five) minutes as needed for chest pain. (Patient not taking: Reported on 09/28/2015) 25 tablet 3  . traMADol (ULTRAM) 50 MG tablet Take 0.5 tablets (25 mg total) by mouth every 6 (six) hours as needed  for severe pain. (Patient not taking: Reported on 09/28/2015) 30 tablet 0   No current facility-administered medications for this visit.    Objective: BP 102/58 mmHg  Pulse 80  Temp(Src) 97.7 F (36.5 C)  Wt 171 lb (77.565 kg)  SpO2 95% Gen: NAD, resting comfortably CV: irregularly irregular, no murmurs rubs or gallops Lungs: CTAB no crackles, wheeze, rhonchi Abdomen: soft/nontender/nondistended/normal bowel sounds. No rebound or guarding.  Ext: no edema Skin: warm, dry Neuro: grossly normal, moves all extremities  Assessment/Plan:  Acute combined systolic and diastolic congestive heart failure (HCC) S: resolved after initiation of dialysis. Likely fluid overload from ESRD in combination with baseline CHF.  A/P: continue dialysis MWF   Thrombocytopenia (HCC) S: concern in hospital was HIT vs. Allopurinol or crestor related. These were both d/ced. Patient believes levels have improved and last 2 cbc did show improving trend.  A/P: we will obtain records from renal which patient will request tomorrow to see if thrombocytopenia has resolved. If >100 likely would just continue to trend.    GOUT S: flare in hospital required prednisone. Taken off alloprurinol A/P: if patient is to use Colchicine 0.6mg  every 2 weeks maximum  for flares. Asked him to check with renal to make sure they are ok with this. altenatively could use prn prednisone but not ideal for diabetes   HCAP (healthcare-associated pneumonia) S: resolved on course of antibiotics A/P: patient well appearing after completion and lungs clear- pneumonia has resolved.    ESRD on dialysis [facility]) S: hospitalized with toxic metabolic encephalopathy. TDC catheter emergently placed and dialysis started. Encephalopathy improved. Patient knew this was life or death decision and he chose life although previously had declined access on multiple occasions with Dr. Briant Cedar.. Left arm fistula was placed during hospitalization as  well. Originally patient to SNF but decided to go home with RN and PT.  A/P: continue MWF dialysis adn Dr. Briant Cedar follow up.   Essential hypertension S: controlled. On Amlodipine 5 mg BID, carvedilol 12.5 mg BID.  Lasix stopped when starting dialysis. Hydralazine stopped due to low BP BP Readings from Last 3 Encounters:  09/28/15 102/58  09/13/15 131/66  09/01/15 119/78  A/P:Continue current meds:  Permanently discontinued hydralazine - patient states he wanted my opinion. He may need further reduction in BP medicines- likely reduce amlodipine if remains <115/75.     3 month follow up Return precautions advised.    Meds ordered this encounter  Medications  . colchicine 0.6 MG tablet    Sig: Take 0.6 mg by mouth. May take 1 tab every 2 weeks if having gout flare if ok with kidney doctor.    Refill:  0

## 2015-09-28 NOTE — Patient Instructions (Addendum)
Stop hydralazine (blood pressure) Dialysis doctors may further reduce blood pressure medicines as your blood pressure remains somewhat low  Stop crestor permanently  Stop allopurinol permanently  Check with dialysis doctors- From my reading you can take 1 pill of colchicine once every 2 weeks for gout flares- make sure this is ok with them.   It would be great if they could send me an updated medication list to confirm we have right meds. In addition, sending recent labs specifically CBC so we can see if platelets continue to do better.   I would like to see you within 3 months but absolutely always willing to see you sooner if anything comes up.

## 2015-09-28 NOTE — Assessment & Plan Note (Signed)
S: flare in hospital required prednisone. Taken off alloprurinol A/P: if patient is to use Colchicine 0.6mg  every 2 weeks maximum for flares. Asked him to check with renal to make sure they are ok with this. altenatively could use prn prednisone but not ideal for diabetes

## 2015-09-28 NOTE — Assessment & Plan Note (Signed)
S: concern in hospital was HIT vs. Allopurinol or crestor related. These were both d/ced. Patient believes levels have improved and last 2 cbc did show improving trend.  A/P: we will obtain records from renal which patient will request tomorrow to see if thrombocytopenia has resolved. If >100 likely would just continue to trend.

## 2015-09-28 NOTE — Assessment & Plan Note (Signed)
S: resolved after initiation of dialysis. Likely fluid overload from ESRD in combination with baseline CHF.  A/P: continue dialysis MWF

## 2015-09-29 ENCOUNTER — Encounter: Payer: Self-pay | Admitting: Family Medicine

## 2015-09-30 ENCOUNTER — Other Ambulatory Visit: Payer: Self-pay

## 2015-09-30 MED ORDER — CALCIUM ACETATE (PHOS BINDER) 667 MG PO CAPS: 667.0000 mg | ORAL_CAPSULE | Freq: Three times a day (TID) | ORAL | Status: DC

## 2015-09-30 NOTE — Progress Notes (Signed)
HPI: FU coronary artery disease. His most recent catheterization was in March 2005 and showed a 40% left main, no significant disease in the circumflex, total occlusion of the ramus branch with a 50% first marginal, 40% proximal, followed by 95% mid, and 90% mid distal stenosis in the right coronary artery. The patient had successful PCI of the right coronary artery both the mid and mid-to- distal right lesions. Nuclear study 2/16 showed EF 39; inferior/inferolateral infarct with mild peri-infarct ischemia. Treated medically. Echo 10/16 showed EF 40-45; mild MR, moderate TR; moderately elevated pulmonary pressure. Patient was admitted October 2016 with encephalopathy and pneumonia. Also had atrial fibrillation and was started on Coumadin. TSH was low and Synthroid dose was decreased. Since last seen, Patient denies dyspnea, chest pain, palpitations or syncope. His blood pressure is running low on dialysis.  Current Outpatient Prescriptions  Medication Sig Dispense Refill  . calcium acetate (PHOSLO) 667 MG capsule Take 1 capsule (667 mg total) by mouth 3 (three) times daily with meals. 90 capsule 2  . carvedilol (COREG) 12.5 MG tablet Take 1 tablet (12.5 mg total) by mouth 2 (two) times daily with a meal. 60 tablet 11  . glipiZIDE (GLUCOTROL) 5 MG tablet Take 2.5-5 mg by mouth 2 (two) times daily before a meal. Take 1/2 tablet (2.5 mg) daily before breakfast and 1 tablet (5 mg) before supper  3  . isosorbide mononitrate (IMDUR) 60 MG 24 hr tablet TAKE 1 TABLET BY MOUTH DAILY. 30 tablet 6  . levothyroxine (SYNTHROID, LEVOTHROID) 175 MCG tablet Take 1 tablet (175 mcg total) by mouth daily. 30 tablet 5  . multivitamin (RENA-VIT) TABS tablet Take 1 tablet by mouth daily.    . nitroGLYCERIN (NITROSTAT) 0.4 MG SL tablet Place 1 tablet (0.4 mg total) under the tongue every 5 (five) minutes as needed for chest pain. 25 tablet 3  . olopatadine (PATANOL) 0.1 % ophthalmic solution Place 1 drop into both  eyes 2 (two) times daily. (Patient taking differently: Place 1 drop into both eyes daily. ) 5 mL 3  . warfarin (COUMADIN) 5 MG tablet Take 1 tablet by mouth daily or as directed by coumadin clinic 30 tablet 2   No current facility-administered medications for this visit.     Past Medical History  Diagnosis Date  . CAD (coronary artery disease)     a. s/p PCI to mid and mid-distal RCA, 40% sten L main, no sig dz LCx  b. myoview 11/24/2014 inferoapex ischemia, medical management due to high risk for contrast nephropathy  . HTN (hypertension)   . Hyperlipidemia   . Cerebrovascular accident (HCC)   . CHF (congestive heart failure) (HCC)     EF 35% echo 2012 ; EF 44% myoview 2012; EF 76% by [facility] November 2014  . Diabetes mellitus   . 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 Unspecified hypothyroidism   . Gout   . Respiratory failure, acute (HCC) 07/21/2015    Past Surgical History  Procedure Laterality Date  . Thyroidectomy    . Av fistula placement Left 09/10/2015    Procedure: BRACHIOCEPHALIC ARTERIOVENOUS (AV) FISTULA CREATION;  Surgeon: Nada Libman, MD;  Location: [facility] OR;  Service: Vascular;  Laterality: Left;    Social History   Social History  . Marital Status: Married    Spouse Name: N/A  . Number of Children: N/A  . Years of Education: N/A   Occupational History  . Not on  file.   Social History Main Topics  . Smoking status: Former Smoker -- 0.50 packs/day    Types: Cigarettes    Quit date: 10/16/1953  . Smokeless tobacco: Not on file     Comment: quit in the 70's  . Alcohol Use: No  . Drug Use: No  . Sexual Activity: Not Currently   Other Topics Concern  . Not on file   Social History Narrative   Lives in [place] with his wife and has 6 kids.     ROS: no fevers or chills, productive cough, hemoptysis, dysphasia, odynophagia, melena, hematochezia, dysuria, hematuria, rash, seizure activity, orthopnea, PND, pedal edema, claudication. Remaining  systems are negative.  Physical Exam: Well-developed well-nourished in no acute distress.  Skin is warm and dry.  HEENT is normal.  Neck is supple.  Chest is clear to auscultation with normal expansion.  Cardiovascular exam is irregular Abdominal exam nontender or distended. No masses palpated. Extremities show no edema. neuro grossly intact

## 2015-10-04 ENCOUNTER — Encounter: Payer: Self-pay | Admitting: Family Medicine

## 2015-10-06 ENCOUNTER — Ambulatory Visit (INDEPENDENT_AMBULATORY_CARE_PROVIDER_SITE_OTHER): Payer: Medicare Other | Admitting: Pharmacist Clinician (PhC)/ Clinical Pharmacy Specialist

## 2015-10-06 DIAGNOSIS — Z7901 Long term (current) use of anticoagulants: Secondary | ICD-10-CM | POA: Diagnosis not present

## 2015-10-06 DIAGNOSIS — I4891 Unspecified atrial fibrillation: Secondary | ICD-10-CM

## 2015-10-06 LAB — POCT INR: INR: 1.6

## 2015-10-07 ENCOUNTER — Encounter: Payer: Self-pay | Admitting: Cardiology

## 2015-10-07 ENCOUNTER — Ambulatory Visit (INDEPENDENT_AMBULATORY_CARE_PROVIDER_SITE_OTHER): Payer: Medicare Other | Admitting: Cardiology

## 2015-10-07 VITALS — BP 90/50 | HR 76 | Ht 69.0 in | Wt 167.4 lb

## 2015-10-07 DIAGNOSIS — I255 Ischemic cardiomyopathy: Secondary | ICD-10-CM | POA: Diagnosis not present

## 2015-10-07 DIAGNOSIS — I2510 Atherosclerotic heart disease of native coronary artery without angina pectoris: Secondary | ICD-10-CM

## 2015-10-07 DIAGNOSIS — E785 Hyperlipidemia, unspecified: Secondary | ICD-10-CM | POA: Diagnosis not present

## 2015-10-07 DIAGNOSIS — I4819 Other persistent atrial fibrillation: Secondary | ICD-10-CM

## 2015-10-07 DIAGNOSIS — I481 Persistent atrial fibrillation: Secondary | ICD-10-CM | POA: Diagnosis not present

## 2015-10-07 DIAGNOSIS — I5042 Chronic combined systolic (congestive) and diastolic (congestive) heart failure: Secondary | ICD-10-CM

## 2015-10-07 MED ORDER — ROSUVASTATIN CALCIUM 20 MG PO TABS: 20.0000 mg | ORAL_TABLET | Freq: Every day | ORAL | Status: DC

## 2015-10-07 NOTE — Assessment & Plan Note (Signed)
Patient remains in atrial fibrillation on exam.Embolic risk factors include age greater than 4875, hypertension, coronary artery disease, history of congestive heart failure, Diabetes mellitus and history of CVA. CHADsvasc 8. Continue Coumadin. Continue carvedilol for rate control. He is relatively asymptomatic. Plan rate control and anticoagulation.

## 2015-10-07 NOTE — Patient Instructions (Signed)
Medication Instructions:   STOP AMLODIPINE   STOP ISOSORBIDE  START ROUSVASTATIN 20 MG ONCE DAILY  Labwork:  Your physician recommends that you return for lab work in: 4 WEEKS-DO NOT EAT PRIOR TO LAB WORK   Follow-Up:  Your physician wants you to follow-up in: 6 MONTHS WITH DR Jens SomRENSHAW You will receive a reminder letter in the mail two months in advance. If you don't receive a letter, please call our office to schedule the follow-up appointment.   If you need a refill on your cardiac medications before your next appointment, please call your pharmacy.

## 2015-10-07 NOTE — Assessment & Plan Note (Addendum)
Blood pressure is running low. Discontinue Norvasc and isosorbide. Follow blood pressure and adjust regimen as needed.

## 2015-10-07 NOTE — Assessment & Plan Note (Addendum)
Resume Crestor 20 mg daily. Check lipids and liver in 4 weeks.

## 2015-10-07 NOTE — Assessment & Plan Note (Addendum)
Resume statin. No aspirin given need for Coumadin.

## 2015-10-07 NOTE — Assessment & Plan Note (Addendum)
Continue beta blocker. No ACE inhibitor or hydralazine/nitrates given borderline blood pressure.

## 2015-10-07 NOTE — Assessment & Plan Note (Addendum)
Patient is euvolemic. Volume is managed with dialysis.

## 2015-10-12 ENCOUNTER — Ambulatory Visit (INDEPENDENT_AMBULATORY_CARE_PROVIDER_SITE_OTHER): Payer: Medicare Other | Admitting: Pharmacist Clinician (PhC)/ Clinical Pharmacy Specialist

## 2015-10-12 DIAGNOSIS — Z7901 Long term (current) use of anticoagulants: Secondary | ICD-10-CM

## 2015-10-12 DIAGNOSIS — I481 Persistent atrial fibrillation: Secondary | ICD-10-CM

## 2015-10-12 DIAGNOSIS — I4819 Other persistent atrial fibrillation: Secondary | ICD-10-CM

## 2015-10-12 LAB — POCT INR: INR: 1.8

## 2015-10-19 ENCOUNTER — Ambulatory Visit (INDEPENDENT_AMBULATORY_CARE_PROVIDER_SITE_OTHER): Payer: Medicare Other | Admitting: Pharmacist Clinician (PhC)/ Clinical Pharmacy Specialist

## 2015-10-19 DIAGNOSIS — I481 Persistent atrial fibrillation: Secondary | ICD-10-CM

## 2015-10-19 DIAGNOSIS — I4819 Other persistent atrial fibrillation: Secondary | ICD-10-CM

## 2015-10-19 DIAGNOSIS — Z7901 Long term (current) use of anticoagulants: Secondary | ICD-10-CM

## 2015-10-19 LAB — POCT INR: INR: 3.1

## 2015-10-20 ENCOUNTER — Encounter (HOSPITAL_COMMUNITY): Payer: Self-pay | Admitting: *Deleted

## 2015-10-20 ENCOUNTER — Emergency Department (HOSPITAL_COMMUNITY): Payer: Medicare Other

## 2015-10-20 ENCOUNTER — Inpatient Hospital Stay (HOSPITAL_COMMUNITY)
Admission: EM | Admit: 2015-10-20 | Discharge: 2015-10-25 | DRG: 871 | Disposition: A | Discharge status: Home / Self Care | Payer: Medicare Other | Attending: Internal Medicine | Admitting: Internal Medicine

## 2015-10-20 DIAGNOSIS — N186 End stage renal disease: Secondary | ICD-10-CM | POA: Diagnosis not present

## 2015-10-20 DIAGNOSIS — Z7901 Long term (current) use of anticoagulants: Secondary | ICD-10-CM | POA: Diagnosis not present

## 2015-10-20 DIAGNOSIS — Z833 Family history of diabetes mellitus: Secondary | ICD-10-CM

## 2015-10-20 DIAGNOSIS — E1129 Type 2 diabetes mellitus with other diabetic kidney complication: Secondary | ICD-10-CM | POA: Diagnosis present

## 2015-10-20 DIAGNOSIS — I4891 Unspecified atrial fibrillation: Secondary | ICD-10-CM

## 2015-10-20 DIAGNOSIS — E876 Hypokalemia: Secondary | ICD-10-CM | POA: Diagnosis present

## 2015-10-20 DIAGNOSIS — N2581 Secondary hyperparathyroidism of renal origin: Secondary | ICD-10-CM | POA: Diagnosis present

## 2015-10-20 DIAGNOSIS — Z809 Family history of malignant neoplasm, unspecified: Secondary | ICD-10-CM | POA: Diagnosis not present

## 2015-10-20 DIAGNOSIS — I10 Essential (primary) hypertension: Secondary | ICD-10-CM | POA: Diagnosis not present

## 2015-10-20 DIAGNOSIS — R4182 Altered mental status, unspecified: Secondary | ICD-10-CM | POA: Diagnosis not present

## 2015-10-20 DIAGNOSIS — G9340 Encephalopathy, unspecified: Secondary | ICD-10-CM | POA: Diagnosis not present

## 2015-10-20 DIAGNOSIS — J189 Pneumonia, unspecified organism: Secondary | ICD-10-CM | POA: Diagnosis present

## 2015-10-20 DIAGNOSIS — Z91018 Allergy to other foods: Secondary | ICD-10-CM

## 2015-10-20 DIAGNOSIS — Z79899 Other long term (current) drug therapy: Secondary | ICD-10-CM | POA: Diagnosis not present

## 2015-10-20 DIAGNOSIS — I2510 Atherosclerotic heart disease of native coronary artery without angina pectoris: Secondary | ICD-10-CM | POA: Diagnosis present

## 2015-10-20 DIAGNOSIS — Z9861 Coronary angioplasty status: Secondary | ICD-10-CM | POA: Diagnosis not present

## 2015-10-20 DIAGNOSIS — E1122 Type 2 diabetes mellitus with diabetic chronic kidney disease: Secondary | ICD-10-CM | POA: Diagnosis present

## 2015-10-20 DIAGNOSIS — Z992 Dependence on renal dialysis: Secondary | ICD-10-CM

## 2015-10-20 DIAGNOSIS — M109 Gout, unspecified: Secondary | ICD-10-CM | POA: Diagnosis present

## 2015-10-20 DIAGNOSIS — I481 Persistent atrial fibrillation: Secondary | ICD-10-CM | POA: Diagnosis not present

## 2015-10-20 DIAGNOSIS — D631 Anemia in chronic kidney disease: Secondary | ICD-10-CM | POA: Diagnosis present

## 2015-10-20 DIAGNOSIS — I120 Hypertensive chronic kidney disease with stage 5 chronic kidney disease or end stage renal disease: Secondary | ICD-10-CM | POA: Diagnosis present

## 2015-10-20 DIAGNOSIS — R531 Weakness: Secondary | ICD-10-CM | POA: Diagnosis present

## 2015-10-20 DIAGNOSIS — Z87891 Personal history of nicotine dependence: Secondary | ICD-10-CM | POA: Diagnosis not present

## 2015-10-20 DIAGNOSIS — Z8673 Personal history of transient ischemic attack (TIA), and cerebral infarction without residual deficits: Secondary | ICD-10-CM | POA: Diagnosis not present

## 2015-10-20 DIAGNOSIS — E039 Hypothyroidism, unspecified: Secondary | ICD-10-CM | POA: Diagnosis present

## 2015-10-20 DIAGNOSIS — I482 Chronic atrial fibrillation: Secondary | ICD-10-CM | POA: Diagnosis present

## 2015-10-20 DIAGNOSIS — Z8249 Family history of ischemic heart disease and other diseases of the circulatory system: Secondary | ICD-10-CM

## 2015-10-20 DIAGNOSIS — I509 Heart failure, unspecified: Secondary | ICD-10-CM

## 2015-10-20 DIAGNOSIS — I5042 Chronic combined systolic (congestive) and diastolic (congestive) heart failure: Secondary | ICD-10-CM | POA: Diagnosis present

## 2015-10-20 DIAGNOSIS — J9601 Acute respiratory failure with hypoxia: Secondary | ICD-10-CM | POA: Diagnosis present

## 2015-10-20 DIAGNOSIS — R509 Fever, unspecified: Secondary | ICD-10-CM | POA: Diagnosis present

## 2015-10-20 DIAGNOSIS — Z7984 Long term (current) use of oral hypoglycemic drugs: Secondary | ICD-10-CM

## 2015-10-20 DIAGNOSIS — A419 Sepsis, unspecified organism: Principal | ICD-10-CM | POA: Diagnosis present

## 2015-10-20 DIAGNOSIS — Z841 Family history of disorders of kidney and ureter: Secondary | ICD-10-CM | POA: Diagnosis not present

## 2015-10-20 DIAGNOSIS — E785 Hyperlipidemia, unspecified: Secondary | ICD-10-CM | POA: Diagnosis present

## 2015-10-20 DIAGNOSIS — R0902 Hypoxemia: Secondary | ICD-10-CM

## 2015-10-20 LAB — CBC WITH DIFFERENTIAL/PLATELET
BASOS PCT: 0 %
Basophils Absolute: 0 10*3/uL (ref 0.0–0.1)
EOS ABS: 0.2 10*3/uL (ref 0.0–0.7)
Eosinophils Relative: 4 %
HCT: 39.9 % (ref 39.0–52.0)
HEMOGLOBIN: 12.4 g/dL — AB (ref 13.0–17.0)
LYMPHS ABS: 1.1 10*3/uL (ref 0.7–4.0)
Lymphocytes Relative: 20 %
MCH: 30.4 pg (ref 26.0–34.0)
MCHC: 31.1 g/dL (ref 30.0–36.0)
MCV: 97.8 fL (ref 78.0–100.0)
Monocytes Absolute: 0.5 10*3/uL (ref 0.1–1.0)
Monocytes Relative: 9 %
NEUTROS PCT: 67 %
Neutro Abs: 3.9 10*3/uL (ref 1.7–7.7)
Platelets: 175 10*3/uL (ref 150–400)
RBC: 4.08 MIL/uL — AB (ref 4.22–5.81)
RDW: 17.1 % — ABNORMAL HIGH (ref 11.5–15.5)
WBC: 5.7 10*3/uL (ref 4.0–10.5)

## 2015-10-20 LAB — COMPREHENSIVE METABOLIC PANEL
ALT: 24 U/L (ref 17–63)
ANION GAP: 14 (ref 5–15)
AST: 45 U/L — ABNORMAL HIGH (ref 15–41)
Albumin: 2.6 g/dL — ABNORMAL LOW (ref 3.5–5.0)
Alkaline Phosphatase: 91 U/L (ref 38–126)
BUN: 25 mg/dL — ABNORMAL HIGH (ref 6–20)
CHLORIDE: 94 mmol/L — AB (ref 101–111)
CO2: 28 mmol/L (ref 22–32)
CREATININE: 3.38 mg/dL — AB (ref 0.61–1.24)
Calcium: 9 mg/dL (ref 8.9–10.3)
GFR, EST AFRICAN AMERICAN: 19 mL/min — AB (ref >60)
GFR, EST NON AFRICAN AMERICAN: 16 mL/min — AB (ref >60)
Glucose, Bld: 148 mg/dL — ABNORMAL HIGH (ref 65–99)
Potassium: 3.1 mmol/L — ABNORMAL LOW (ref 3.5–5.1)
Sodium: 136 mmol/L (ref 135–145)
Total Bilirubin: 0.8 mg/dL (ref 0.3–1.2)
Total Protein: 7.8 g/dL (ref 6.5–8.1)

## 2015-10-20 LAB — PROTIME-INR
INR: 2.73 — AB (ref 0.00–1.49)
Prothrombin Time: 28.5 seconds — ABNORMAL HIGH (ref 11.6–15.2)

## 2015-10-20 LAB — I-STAT CG4 LACTIC ACID, ED: Lactic Acid, Venous: 1.92 mmol/L (ref 0.5–2.0)

## 2015-10-20 MED ORDER — ACETAMINOPHEN 650 MG RE SUPP
650.0000 mg | Freq: Once | RECTAL | Status: AC
  Administered 2015-10-20: 650 mg via RECTAL
  Filled 2015-10-20: qty 1

## 2015-10-20 MED ORDER — SODIUM CHLORIDE 0.9 % IV BOLUS (SEPSIS)
500.0000 mL | Freq: Once | INTRAVENOUS | Status: AC
  Administered 2015-10-20: 500 mL via INTRAVENOUS

## 2015-10-20 MED ORDER — VANCOMYCIN HCL 10 G IV SOLR
1500.0000 mg | Freq: Once | INTRAVENOUS | Status: AC
  Administered 2015-10-20: 1500 mg via INTRAVENOUS
  Filled 2015-10-20: qty 1500

## 2015-10-20 MED ORDER — VANCOMYCIN HCL IN DEXTROSE 1-5 GM/200ML-% IV SOLN: 1000.0000 mg | Freq: Once | INTRAVENOUS | Status: DC

## 2015-10-20 MED ORDER — PIPERACILLIN-TAZOBACTAM 3.375 G IVPB 30 MIN
3.3750 g | Freq: Once | INTRAVENOUS | Status: AC
  Administered 2015-10-20: 3.375 g via INTRAVENOUS
  Filled 2015-10-20: qty 50

## 2015-10-20 NOTE — ED Notes (Signed)
Family states patient was at home in his bedroom and fell on to his left side.  Patient is c/o of left knee pain following the fall on Monday.  Family denies LOC when he fell.

## 2015-10-20 NOTE — ED Notes (Signed)
Patient transported to CT 

## 2015-10-20 NOTE — Progress Notes (Signed)
ANTIBIOTIC CONSULT NOTE - INITIAL  Pharmacy Consult for Vancomycin and Zosyn Indication: rule out sepsis  Allergies  Allergen Reactions  . Tomato Other (See Comments)    Too much acid - cannot take due to kidney issues    Patient Measurements:   Adjusted Body Weight:   Vital Signs: Temp: 100.9 F (38.3 C) (01/04 2109) Temp Source: Rectal (01/04 2109) BP: 132/74 mmHg (01/04 2030) Pulse Rate: 138 (01/04 1945) Intake/Output from previous day:   Intake/Output from this shift:    Labs:  Recent Labs  10/20/15 2004  WBC 5.7  HGB 12.4*  PLT 175  CREATININE 3.38*   Estimated Creatinine Clearance: 18.3 mL/min (by C-G formula based on Cr of 3.38). No results for input(s): VANCOTROUGH, VANCOPEAK, VANCORANDOM, GENTTROUGH, GENTPEAK, GENTRANDOM, TOBRATROUGH, TOBRAPEAK, TOBRARND, AMIKACINPEAK, AMIKACINTROU, AMIKACIN in the last 72 hours.   Microbiology: No results found for this or any previous visit (from the past 720 hour(s)).  Medical History: Past Medical History  Diagnosis Date  . CAD (coronary artery disease)     a. s/p PCI to mid and mid-distal RCA, 40% sten L main, no sig dz LCx  b. myoview 11/24/2014 inferoapex ischemia, medical management due to high risk for contrast nephropathy  . HTN (hypertension)   . Hyperlipidemia   . Cerebrovascular accident (HCC)   . CHF (congestive heart failure) (HCC)     EF 35% echo 2012 ; EF 44% myoview 2012; [facility]  . Diabetes mellitus   . 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 Unspecified hypothyroidism   . Gout   . Respiratory failure, acute (HCC) 07/21/2015    Medications:   (Not in a hospital admission) Scheduled:  . acetaminophen  650 mg Rectal Once   Infusions:  . piperacillin-tazobactam    . sodium chloride    . vancomycin     Assessment: 80yo male with history of ESRD on HD (MWF with last session 1/4), CHF, DM2, and CAD presents with AMS. Pharmacy is consulted to dose vancomycin and  zosyn for suspected sepsis. Tmax 100.9, WBC 5.7, sCr 3.38  Pt received vancomycin 1500mg  and zosyn 3.375g IV once in the ED.  Goal of Therapy:  Vancomycin trough level 15-20 mcg/ml  Plan:  Vancomycin 750mg  IV qHD Zosyn 2.25g IV q8h Measure antibiotic drug levels at steady state Follow up culture results, HD plan, and clinical course  Arlean HoppingCorey M. Newman PiesBall, PharmD, BCPS Clinical Pharmacist Pager [phone] 10/20/2015,10:19 PM

## 2015-10-20 NOTE — ED Notes (Signed)
The pt arrived by gems from home.  The family picked up the pt from dialysis approx 1600.  The family found him confused.  Unknown time that he was last seen normal.  Unknown time he was dropped off for dialysis.  New fistula lt arm not used yet for dialysis.  Dialysis cath rt upper chest.  Does not answer questions asked.  Incontinent of stool

## 2015-10-20 NOTE — ED Provider Notes (Signed)
CSN: [id number]     Arrival date & time 10/20/15  1903 History   First MD Initiated Contact with Patient 10/20/15 1909     Chief Complaint  Patient presents with  . Altered Mental Status     (Consider location/radiation/quality/duration/timing/severity/associated sxs/prior Treatment) HPI Comments: 80 year old male with extensive past medical history including CVA, CHF, type 2 diabetes mellitus, ESRD on HD, CAD who presents with altered mental status. hx limited due to the patient's altered mentation and obtained from family members. Family reports that the patient had dialysis this afternoon and they picked him up approximately 4 PM and found him confused. He has been awake but disoriented. Unknown last normal. They report that he intermittently had some confusion last week. He did have a fall onto his left side earlier this week but did not lose consciousness. He chronically complains of left knee pain but they state that this is not new as he has chronic problems with his knees. They're unaware of any vomiting, cough/cold symptoms, or fevers.  Patient is a 80 y.o. male presenting with altered mental status. The history is provided by a relative.  Altered Mental Status   Past Medical History  Diagnosis Date  . CAD (coronary artery disease)     a. s/p PCI to mid and mid-distal RCA, 40% sten L main, no sig dz LCx  b. myoview 11/24/2014 inferoapex ischemia, medical management due to high risk for contrast nephropathy  . HTN (hypertension)   . Hyperlipidemia   . Cerebrovascular accident (HCC)   . CHF (congestive heart failure) (HCC)     EF 35% echo 2012 ; EF 44% myoview 2012; EF 76% by [facility] November 2014  . Diabetes mellitus   . 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 Unspecified hypothyroidism   . Gout   . Respiratory failure, acute (HCC) 07/21/2015   Past Surgical History  Procedure Laterality Date  . Thyroidectomy    . Av fistula placement Left 09/10/2015    Procedure:  BRACHIOCEPHALIC ARTERIOVENOUS (AV) FISTULA CREATION;  Surgeon: Nada Libman, MD;  Location: [facility] OR;  Service: Vascular;  Laterality: Left;   Family History  Problem Relation Age of Onset  . Kidney failure Mother   . Diabetes Mother   . Cancer Father     lung  . Heart attack Brother     massive heart attack in 7s  . Hypertension Mother    Social History  Substance Use Topics  . Smoking status: Former Smoker -- 0.50 packs/day    Types: Cigarettes    Quit date: 10/16/1953  . Smokeless tobacco: None     Comment: quit in the 70's  . Alcohol Use: No    Review of Systems  10 Systems reviewed and are negative for acute change except as noted in the HPI.   Allergies  Tomato  Home Medications   Prior to Admission medications   Medication Sig Start Date End Date Taking? Authorizing Provider  calcium acetate (PHOSLO) 667 MG capsule Take 1 capsule (667 mg total) by mouth 3 (three) times daily with meals. 09/30/15  Yes [name], MD  carvedilol (COREG) 12.5 MG tablet Take 1 tablet (12.5 mg total) by mouth 2 (two) times daily with a meal. 01/11/15  Yes [name] Patty, MD  glipiZIDE (GLUCOTROL) 5 MG tablet Take 2.5-5 mg by mouth 2 (two) times daily before a meal. Take 1/2 tablet (2.5 mg) daily before breakfast and 1 tablet (5 mg) before  supper 05/01/15  Yes Historical Provider, MD  levothyroxine (SYNTHROID, LEVOTHROID) 175 MCG tablet Take 1 tablet (175 mcg total) by mouth daily. 08/13/15  Yes [name], MD  multivitamin (RENA-VIT) TABS tablet Take 1 tablet by mouth daily.   Yes Historical Provider, MD  nitroGLYCERIN (NITROSTAT) 0.4 MG SL tablet Place 1 tablet (0.4 mg total) under the tongue every 5 (five) minutes as needed for chest pain. 11/25/14 04/25/17 Yes Azalee Course, PA  olopatadine (PATANOL) 0.1 % ophthalmic solution Place 1 drop into both eyes 2 (two) times daily. Patient taking differently: Place 1 drop into both eyes daily.  04/20/15  Yes [name], MD   rosuvastatin (CRESTOR) 20 MG tablet Take 1 tablet (20 mg total) by mouth daily. 10/07/15  Yes [name], MD  warfarin (COUMADIN) 5 MG tablet Take 1 tablet by mouth daily or as directed by coumadin clinic Patient taking differently: Take 5 mg by mouth daily.  09/17/15  Yes [name], MD   BP 132/74 mmHg  Pulse 138  Temp(Src) 100.9 F (38.3 C) (Rectal)  Resp 23  SpO2 93% Physical Exam  Constitutional: He appears well-developed and well-nourished. No distress.  HENT:  Head: Normocephalic and atraumatic.  Mildly dry mucous membranes  Eyes: Conjunctivae are normal. Pupils are equal, round, and reactive to light.  Neck: Neck supple.  Cardiovascular: Normal heart sounds.   No murmur heard. Irregularly irregular rhythm, mildly tachycardic  Pulmonary/Chest: Effort normal and breath sounds normal.  Abdominal: Soft. Bowel sounds are normal. He exhibits no distension. There is no tenderness.  Musculoskeletal: He exhibits no edema.  No obvious deformity left knee  Neurological: He is alert.  Oriented to person only, only intermittently able to follow commands, moves all 4 extremities, no clonus  Skin: Skin is warm and dry.  Left upper arm fistula with palpable thrill  Nursing note and vitals reviewed.   ED Course  Procedures (including critical care time) Labs Review Labs Reviewed  COMPREHENSIVE METABOLIC PANEL - Abnormal; Notable for the following:    Potassium 3.1 (*)    Chloride 94 (*)    Glucose, Bld 148 (*)    BUN 25 (*)    Creatinine, Ser 3.38 (*)    Albumin 2.6 (*)    AST 45 (*)    GFR calc non Af Amer 16 (*)    GFR calc Af Amer 19 (*)    All other components within normal limits  CBC WITH DIFFERENTIAL/PLATELET - Abnormal; Notable for the following:    RBC 4.08 (*)    Hemoglobin 12.4 (*)    RDW 17.1 (*)    All other components within normal limits  URINE CULTURE  CULTURE, BLOOD (ROUTINE X 2)  CULTURE, BLOOD (ROUTINE X 2)  PROTIME-INR  I-STAT CG4 LACTIC  ACID, ED    Imaging Review Ct Head Wo Contrast  10/20/2015  CLINICAL DATA:  Altered mental status EXAM: CT HEAD WITHOUT CONTRAST TECHNIQUE: Contiguous axial images were obtained from the base of the skull through the vertex without intravenous contrast. COMPARISON:  Head CT July 19, 2015; brain MRI July 20, 2015 FINDINGS: There is stable age related volume loss. There is no intracranial mass, hemorrhage, extra-axial fluid collection, or midline shift. There is stable slight small vessel disease in the centra semiovale bilaterally. Elsewhere gray-white compartments are normal. No acute infarct is evident. Calcification along the falx is stable. Bony calvarium appears intact. The mastoid air cells are clear. No intraorbital lesions are identified. IMPRESSION: Slight  periventricular small vessel disease, stable. No intracranial mass, hemorrhage, or new gray - white compartment lesions/acute appearing infarct. Stable appearance compared to prior studies. Electronically Signed   By: Bretta BangWilliam  Woodruff III M.D.   On: 10/20/2015 21:26   I have personally reviewed and evaluated these lab results as part of my medical decision-making.   EKG Interpretation   Date/Time:  Wednesday October 20 2015 20:03:10 EST Ventricular Rate:  136 PR Interval:    QRS Duration: 150 QT Interval:  367 QTC Calculation: 552 R Axis:   117 Text Interpretation:  Atrial fibrillation Paired ventricular premature  complexes Nonspecific intraventricular conduction delay Repol abnrm  suggests ischemia, anterolateral TECHNICALLY DIFFICULT needs repeat  Confirmed by LITTLE MD, RACHEL ([id number]) on 10/20/2015 9:17:39 PM     Medications  sodium chloride 0.9 % bolus 500 mL (not administered)  acetaminophen (TYLENOL) suppository 650 mg (not administered)    MDM   Final diagnoses:  Altered mental status, unspecified altered mental status type  Fever, unspecified fever cause    Pt w/ ESRD on HD p/w AMS after dialysis today. On  arrival, he was awake, alert, comfortable and in no acute distress. Vital signs notable for atrial fibrillation with variable heart rate near 100. He was oriented only to person and only intermittently compliant with neurologic exam but no obvious neurologic deficits with the exception of his mental status. EKG without acute ischemic changes although technically difficult. He was later noted to be febrile on rectal temperature. Obtained blood and urine cultures, gave Tylenol, vancomycin, and zosyn. Labwork showed reassuring potassium, normal WBC count, normal lactate which is reassuring against sepsis. Gave small fluid bolus given the patient's ESRD. Chest x-ray showed findings consistent with pulmonary edema but no obvious infiltrates. Obtained a head CT which was negative for acute intracranial. Given pt's fever of unknown etiology and ongoing confusion, I discussed admission w/ Dr. Maryfrances Bunnellanford, [facility], and pt admitted for further care.    Laurence Spatesachel Morgan Little, MD 10/21/15 [phone]

## 2015-10-21 ENCOUNTER — Encounter (HOSPITAL_COMMUNITY): Payer: Self-pay | Admitting: Family Medicine

## 2015-10-21 DIAGNOSIS — A419 Sepsis, unspecified organism: Principal | ICD-10-CM

## 2015-10-21 DIAGNOSIS — I2510 Atherosclerotic heart disease of native coronary artery without angina pectoris: Secondary | ICD-10-CM

## 2015-10-21 DIAGNOSIS — I4891 Unspecified atrial fibrillation: Secondary | ICD-10-CM

## 2015-10-21 DIAGNOSIS — I481 Persistent atrial fibrillation: Secondary | ICD-10-CM

## 2015-10-21 DIAGNOSIS — E038 Other specified hypothyroidism: Secondary | ICD-10-CM

## 2015-10-21 DIAGNOSIS — E1121 Type 2 diabetes mellitus with diabetic nephropathy: Secondary | ICD-10-CM

## 2015-10-21 DIAGNOSIS — I5042 Chronic combined systolic (congestive) and diastolic (congestive) heart failure: Secondary | ICD-10-CM

## 2015-10-21 DIAGNOSIS — Z992 Dependence on renal dialysis: Secondary | ICD-10-CM

## 2015-10-21 DIAGNOSIS — I10 Essential (primary) hypertension: Secondary | ICD-10-CM

## 2015-10-21 DIAGNOSIS — N186 End stage renal disease: Secondary | ICD-10-CM

## 2015-10-21 LAB — BASIC METABOLIC PANEL
ANION GAP: 12 (ref 5–15)
BUN: 31 mg/dL — ABNORMAL HIGH (ref 6–20)
CALCIUM: 8.5 mg/dL — AB (ref 8.9–10.3)
CO2: 28 mmol/L (ref 22–32)
Chloride: 98 mmol/L — ABNORMAL LOW (ref 101–111)
Creatinine, Ser: 3.99 mg/dL — ABNORMAL HIGH (ref 0.61–1.24)
GFR, EST AFRICAN AMERICAN: 15 mL/min — AB (ref >60)
GFR, EST NON AFRICAN AMERICAN: 13 mL/min — AB (ref >60)
GLUCOSE: 74 mg/dL (ref 65–99)
POTASSIUM: 2.5 mmol/L — AB (ref 3.5–5.1)
SODIUM: 138 mmol/L (ref 135–145)

## 2015-10-21 LAB — MRSA PCR SCREENING: MRSA BY PCR: NEGATIVE

## 2015-10-21 LAB — CBC
HCT: 34.8 % — ABNORMAL LOW (ref 39.0–52.0)
Hemoglobin: 10.4 g/dL — ABNORMAL LOW (ref 13.0–17.0)
MCH: 29.5 pg (ref 26.0–34.0)
MCHC: 29.9 g/dL — ABNORMAL LOW (ref 30.0–36.0)
MCV: 98.9 fL (ref 78.0–100.0)
PLATELETS: 132 10*3/uL — AB (ref 150–400)
RBC: 3.52 MIL/uL — AB (ref 4.22–5.81)
RDW: 17.3 % — AB (ref 11.5–15.5)
WBC: 4.3 10*3/uL (ref 4.0–10.5)

## 2015-10-21 LAB — PROTIME-INR
INR: 2.83 — AB (ref 0.00–1.49)
Prothrombin Time: 29.3 seconds — ABNORMAL HIGH (ref 11.6–15.2)

## 2015-10-21 LAB — GLUCOSE, CAPILLARY: GLUCOSE-CAPILLARY: 94 mg/dL (ref 65–99)

## 2015-10-21 MED ORDER — WARFARIN - PHARMACIST DOSING INPATIENT
Freq: Every day | Status: DC
  Administered 2015-10-21: 18:00:00

## 2015-10-21 MED ORDER — PIPERACILLIN-TAZOBACTAM IN DEX 2-0.25 GM/50ML IV SOLN
2.2500 g | Freq: Three times a day (TID) | INTRAVENOUS | Status: DC
  Administered 2015-10-21 – 2015-10-23 (×8): 2.25 g via INTRAVENOUS
  Filled 2015-10-21 (×12): qty 50

## 2015-10-21 MED ORDER — LEVOTHYROXINE SODIUM 25 MCG PO TABS
175.0000 ug | ORAL_TABLET | Freq: Every day | ORAL | Status: DC
  Administered 2015-10-21 – 2015-10-25 (×5): 175 ug via ORAL
  Filled 2015-10-21 (×5): qty 1

## 2015-10-21 MED ORDER — ROSUVASTATIN CALCIUM 20 MG PO TABS
20.0000 mg | ORAL_TABLET | Freq: Every day | ORAL | Status: DC
Start: 2015-10-21 — End: 2015-10-25
  Administered 2015-10-21 – 2015-10-25 (×5): 20 mg via ORAL
  Filled 2015-10-21: qty 2
  Filled 2015-10-21 (×4): qty 1

## 2015-10-21 MED ORDER — SODIUM CHLORIDE 0.9 % IJ SOLN
3.0000 mL | Freq: Two times a day (BID) | INTRAMUSCULAR | Status: DC
  Administered 2015-10-21 – 2015-10-24 (×7): 3 mL via INTRAVENOUS
  Administered 2015-10-24: 10 mL via INTRAVENOUS
  Administered 2015-10-25: 3 mL via INTRAVENOUS

## 2015-10-21 MED ORDER — VANCOMYCIN HCL IN DEXTROSE 750-5 MG/150ML-% IV SOLN
750.0000 mg | INTRAVENOUS | Status: DC
  Filled 2015-10-21: qty 150

## 2015-10-21 MED ORDER — WARFARIN SODIUM 7.5 MG PO TABS
7.5000 mg | ORAL_TABLET | ORAL | Status: DC
  Administered 2015-10-21: 7.5 mg via ORAL
  Filled 2015-10-21: qty 1

## 2015-10-21 MED ORDER — POTASSIUM CHLORIDE CRYS ER 20 MEQ PO TBCR
40.0000 meq | EXTENDED_RELEASE_TABLET | Freq: Once | ORAL | Status: AC
  Administered 2015-10-21: 40 meq via ORAL
  Filled 2015-10-21: qty 2

## 2015-10-21 MED ORDER — CALCIUM ACETATE (PHOS BINDER) 667 MG PO CAPS
667.0000 mg | ORAL_CAPSULE | Freq: Three times a day (TID) | ORAL | Status: DC
  Administered 2015-10-21 – 2015-10-25 (×12): 667 mg via ORAL
  Filled 2015-10-21 (×13): qty 1

## 2015-10-21 MED ORDER — WARFARIN SODIUM 5 MG PO TABS: 5.0000 mg | ORAL_TABLET | ORAL | Status: DC

## 2015-10-21 MED ORDER — CARVEDILOL 12.5 MG PO TABS
12.5000 mg | ORAL_TABLET | Freq: Two times a day (BID) | ORAL | Status: DC
  Administered 2015-10-21 – 2015-10-24 (×7): 12.5 mg via ORAL
  Filled 2015-10-21 (×7): qty 1

## 2015-10-21 NOTE — Progress Notes (Signed)
Utilization review completed. Morayma Godown, RN, BSN. 

## 2015-10-21 NOTE — Progress Notes (Signed)
Patient arrived on unit via stretcher with nurse tech. Patient alert and oriented x4. Patient oriented to room, staff and unit. Patient's son and daughter at bedside. Patient placed on telemetry monitor, CCMD notified. Skin assessment completed with two RNs, no skin issues noted. Patient's IV clean, dry and intact. Patient denies pain. Safety Fall Prevention Plan was given, discussed and signed by patient. Orders have been reviewed and implemented. Call light has been placed within reach and bed alarm has been activated. RN will continue to monitor the patient.   Rivka BarbaraZenab Vicky Schleich BSN, RN  Phone Number: [phone]

## 2015-10-21 NOTE — Progress Notes (Signed)
[facility]  Patient Status: Active (receiving services up to time of hospitalization)  [facility] is providing the following services: RN and PT  If patient discharges after hours, please call [phone].   Kizzie FurnishDonna Fellmy 10/21/2015, 12:34 PM

## 2015-10-21 NOTE — Progress Notes (Signed)
PROGRESS NOTE  [name] [name] ZOX:[id number] DOB: September 25, 1936 DOA: 10/20/2015 PCP: [name], MD Brief History 80 year old male with a history of ESRD (MWF), diabetes mellitus, hypertension, diastolic CHF, procedure fibrillation, hypothyroidism presented with generalized weakness. Apparently, the patient has been feeling weak for the past 2-3 days prior to admission. After dialysis on 10/20/2015, the patient had much difficulty getting out of his car secondary to generalized weakness. Upon arrival to the emergency department, the patient was confused and cannot recall the events of the day.  It took him 20 minutes to get out of the car, then 20 minutes to get into the house, and he just seemed like he couldn't walk, urinated on himself, had the shakes, and was incoherent.   He was noted to be tachycardic with a temperature 100.68F and tachypnea. He states that aside from his feelings of weakness prior to admission, he was in his usual state of health. He denied any fevers, chills, chest pain, shortness breath, coughing, hemoptysis, nausea, vomiting, diarrhea, abdominal pain, dysuria, headache, neck pain, rashes. Assessment/Plan: SIRS -Suspect bloodstream infection versus nonspecific viral infection. -Continue empiric vancomycin and Zosyn pending culture data -Fever has improved on antibiotics presently. Diabetes mellitus type 2 -Hemoglobin A1c -Patient normally takes glipizide at home -NovoLog sliding scale Chronic systolic and diastolic CHF -Appears clinically Compensated -07/22/2015 echocardiogram EF 40-45%, PAP 54 -Continue carvedilol ESRD -Nephrology consult for maintenance dialysis -Dialysis was initiated during his admission on 09/03/2015 -Fistula was created, but not yet mature Chronic Atrial fibrillation -CHADS-VASc = 8 -continue coumadin -rate controlled Hypertension -Controlled -Continue carvedilol Hypothyroidism -Continue Synthroid Hyperlipidemia -Continue  statin L-Knee Pain -??fall onto knee -uric acid -xray left knee  Family Communication:   Pt at beside Disposition Plan:   Home when medically stable      Procedures/Studies: Dg Chest 2 View  10/20/2015  CLINICAL DATA:  Acute onset of altered mental status. Tachycardia. Initial encounter. EXAM: CHEST  2 VIEW COMPARISON:  Chest radiograph performed 09/04/2015 FINDINGS: The lungs are well-aerated. Vascular congestion is noted. A small left pleural effusion is suspected. Increased interstitial markings likely reflect pulmonary edema. There is no evidence of pneumothorax. The heart is enlarged. No acute osseous abnormalities are seen. A right-sided dual-lumen catheter is noted ending about the cavoatrial junction. IMPRESSION: Vascular congestion and cardiomegaly. Small left pleural effusion suspected. Increased interstitial markings likely reflect pulmonary edema. Electronically Signed   By: [name] M.D.   On: 10/20/2015 22:00   Ct Head Wo Contrast  10/20/2015  CLINICAL DATA:  Altered mental status EXAM: CT HEAD WITHOUT CONTRAST TECHNIQUE: Contiguous axial images were obtained from the base of the skull through the vertex without intravenous contrast. COMPARISON:  Head CT July 19, 2015; brain MRI July 20, 2015 FINDINGS: There is stable age related volume loss. There is no intracranial mass, hemorrhage, extra-axial fluid collection, or midline shift. There is stable slight small vessel disease in the centra semiovale bilaterally. Elsewhere gray-white compartments are normal. No acute infarct is evident. Calcification along the falx is stable. Bony calvarium appears intact. The mastoid air cells are clear. No intraorbital lesions are identified. IMPRESSION: Slight periventricular small vessel disease, stable. No intracranial mass, hemorrhage, or new gray - white compartment lesions/acute appearing infarct. Stable appearance compared to prior studies. Electronically Signed   By: Bretta Bang  III M.D.   On: 10/20/2015 21:26         Subjective: Patient denies fevers, chills, headache, chest pain, dyspnea,  nausea, vomiting, diarrhea, abdominal pain, dysuria, hematuria   Objective: Filed Vitals:   10/21/15 0108 10/21/15 0611 10/21/15 0844 10/21/15 1513  BP: 108/63 111/60 109/46 110/54  Pulse: 91 86 86 81  Temp: 98.1 F (36.7 C) 98.8 F (37.1 C) 99.8 F (37.7 C) 99.4 F (37.4 C)  TempSrc: Oral Oral Oral Oral  Resp: 18 18 18 17   Height: 5\' 9"  (1.753 m)     Weight: 74.072 kg (163 lb 4.8 oz)     SpO2: 95% 98% 93% 99%    Intake/Output Summary (Last 24 hours) at 10/21/15 1806 Last data filed at 10/21/15 1326  Gross per 24 hour  Intake    240 ml  Output    200 ml  Net     40 ml   Weight change:  Exam:   General:  Pt is alert, follows commands appropriately, not in acute distress  HEENT: No icterus, No thrush, No neck mass, [place]/AT  Cardiovascular: RRR, S1/S2, no rubs, no gallops  Respiratory: CTA bilaterally, no wheezing, no crackles, no rhonchi  Abdomen: Soft/+BS, non tender, non distended, no guarding  Extremities: No edema, No lymphangitis, No petechiae, No rashes, no synovitis; left knee with mild warmth without effusion.  Data Reviewed: Basic Metabolic Panel:  Recent Labs Lab 10/20/15 2004 10/21/15 0432  NA 136 138  K 3.1* 2.5*  CL 94* 98*  CO2 28 28  GLUCOSE 148* 74  BUN 25* 31*  CREATININE 3.38* 3.99*  CALCIUM 9.0 8.5*   Liver Function Tests:  Recent Labs Lab 10/20/15 2004  AST 45*  ALT 24  ALKPHOS 91  BILITOT 0.8  PROT 7.8  ALBUMIN 2.6*   No results for input(s): LIPASE, AMYLASE in the last 168 hours. No results for input(s): AMMONIA in the last 168 hours. CBC:  Recent Labs Lab 10/20/15 2004 10/21/15 0432  WBC 5.7 4.3  NEUTROABS 3.9  --   HGB 12.4* 10.4*  HCT 39.9 34.8*  MCV 97.8 98.9  PLT 175 132*   Cardiac Enzymes: No results for input(s): CKTOTAL, CKMB, CKMBINDEX, TROPONINI in the last 168 hours. BNP: Invalid  input(s): POCBNP CBG:  Recent Labs Lab 10/21/15 0106  GLUCAP 94    Recent Results (from the past 240 hour(s))  Urine culture     Status: None (Preliminary result)   Collection Time: 10/20/15  9:15 PM  Result Value Ref Range Status   Specimen Description URINE, RANDOM  Final   Special Requests NONE  Final   Culture NO GROWTH < 24 HOURS  Final   Report Status PENDING  Incomplete  Culture, blood (routine x 2)     Status: None (Preliminary result)   Collection Time: 10/20/15 10:00 PM  Result Value Ref Range Status   Specimen Description BLOOD RIGHT ARM  Final   Special Requests BOTTLES DRAWN AEROBIC AND ANAEROBIC 5ML  Final   Culture NO GROWTH < 24 HOURS  Final   Report Status PENDING  Incomplete  Culture, blood (routine x 2)     Status: None (Preliminary result)   Collection Time: 10/20/15 10:24 PM  Result Value Ref Range Status   Specimen Description BLOOD RIGHT HAND  Final   Special Requests BOTTLES DRAWN AEROBIC AND ANAEROBIC 4ML  Final   Culture NO GROWTH < 24 HOURS  Final   Report Status PENDING  Incomplete  MRSA PCR Screening     Status: None   Collection Time: 10/21/15  1:41 AM  Result Value Ref Range Status   MRSA by  PCR NEGATIVE NEGATIVE Final    Comment:        The GeneXpert MRSA Assay (FDA approved for NASAL specimens only), is one component of a comprehensive MRSA colonization surveillance program. It is not intended to diagnose MRSA infection nor to guide or monitor treatment for MRSA infections.      Scheduled Meds: . calcium acetate  667 mg Oral TID WC  . carvedilol  12.5 mg Oral BID WC  . levothyroxine  175 mcg Oral QAC breakfast  . piperacillin-tazobactam (ZOSYN)  IV  2.25 g Intravenous Q8H  . rosuvastatin  20 mg Oral Daily  . sodium chloride  3 mL Intravenous Q12H  . [START ON 10/22/2015] vancomycin  750 mg Intravenous Q M,W,F-HD  . [START ON 10/22/2015] warfarin  5 mg Oral Q M,W,F-1800  . warfarin  7.5 mg Oral Q T,Th,S,Su-1800  . Warfarin -  Pharmacist Dosing Inpatient   Does not apply q1800   Continuous Infusions:    Zakara Parkey, DO  [facility] Pager [phone]  If 7PM-7AM, please contact night-coverage [email or web address] Password [email or web address] 10/21/2015, 6:06 PM   LOS: 1 day

## 2015-10-21 NOTE — Progress Notes (Signed)
Critical KCL 2.5, paged Dr. Carmel Sacramentoat fyi

## 2015-10-21 NOTE — H&P (Signed)
History and Physical  Patient Name: [name] [name]     ZOX:[id number]    DOB: 10/03/1936    DOA: 10/20/2015 Referring physician: Frederick Peersachel Little, MD PCP: Tana ConchStephen Hunter, MD      Chief Complaint: Weakness after dialysis  HPI: [name] [name] is a 80 y.o. male with a past medical history significant for ESRD from DM on HD MWF, HTN, hypothyroidism, CAD, and chronic diastolic CHF and AF on warfarin who presents with weakness after HD today.  Most of the history is collected from the patient's son, as the patient is slow and a little confused.  The patient lives with his wife, daughter, and granddaughter and they report that the patient was in his usual state of health until maybe the last few days when he has seemed more weak than usual, and even fell once. This morning, he was about at his baseline, but after dialysis when his son went to pick him up, it took him 20 minutes to get out of the car, then 20 minutes to get into the house, and he just seemed like he couldn't walk, urinated on himself, had the shakes, and was incoherent, so they brought him to the ER.  In the ED, the patient had fever to 100.9, tachycardia, tachypnea, but no leukocytosis. His chest x-ray showed no focal opacity, and his lactic acid level was normal.  Blood and urine cultures were drawn, and vancomycin and Zosyn were started for empiric coverage of early sepsis without source.  He has an immature fistula, and dialyzes from a tunneled catheter in the chest, which has not been painful or draining.    Review of Systems:  Pt complains of weakness, confusion, urinary incontinence, chills. Pt denies any cough, dysuria, pain around his subclavian catheter, emesis, diarrhea, abdominal pain.  All other systems negative except as just noted or noted in the history of present illness.  Allergies  Allergen Reactions  . Tomato Other (See Comments)    Too much acid - cannot take due to kidney issues    Prior to Admission  medications   Medication Sig Start Date End Date Taking? Authorizing Provider  calcium acetate (PHOSLO) 667 MG capsule Take 1 capsule (667 mg total) by mouth 3 (three) times daily with meals. 09/30/15  Yes Shelva MajesticStephen O Hunter, MD  carvedilol (COREG) 12.5 MG tablet Take 1 tablet (12.5 mg total) by mouth 2 (two) times daily with a meal. 01/11/15  Yes [name], MD  glipiZIDE (GLUCOTROL) 5 MG tablet Take 2.5-5 mg by mouth 2 (two) times daily before a meal. Take 1/2 tablet (2.5 mg) daily before breakfast and 1 tablet (5 mg) before supper 05/01/15  Yes Historical Provider, MD  levothyroxine (SYNTHROID, LEVOTHROID) 175 MCG tablet Take 1 tablet (175 mcg total) by mouth daily. 08/13/15  Yes Shelva MajesticStephen O Hunter, MD  multivitamin (RENA-VIT) TABS tablet Take 1 tablet by mouth daily.   Yes Historical Provider, MD  nitroGLYCERIN (NITROSTAT) 0.4 MG SL tablet Place 1 tablet (0.4 mg total) under the tongue every 5 (five) minutes as needed for chest pain. 11/25/14 04/25/17 Yes [name], PA  olopatadine (PATANOL) 0.1 % ophthalmic solution Place 1 drop into both eyes 2 (two) times daily. Patient taking differently: Place 1 drop into both eyes daily.  04/20/15  Yes Shelva MajesticStephen O Hunter, MD  rosuvastatin (CRESTOR) 20 MG tablet Take 1 tablet (20 mg total) by mouth daily. 10/07/15  Yes Lewayne BuntingBrian S Crenshaw, MD  warfarin (COUMADIN) 5 MG tablet Take 1 tablet  by mouth daily or as directed by coumadin clinic Patient taking differently: Take 5 mg by mouth daily.  09/17/15  Yes [name], MD    Past Medical History  Diagnosis Date  . CAD (coronary artery disease)     a. s/p PCI to mid and mid-distal RCA, 40% sten L main, no sig dz LCx  b. myoview 11/24/2014 inferoapex ischemia, medical management due to high risk for contrast nephropathy  . HTN (hypertension)   . Hyperlipidemia   . Cerebrovascular accident (HCC)   . CHF (congestive heart failure) (HCC)     EF 35% echo 2012 ; EF 44% myoview 2012; EF 76% by [facility] November 2014    . Diabetes mellitus   . 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 Unspecified hypothyroidism   . Gout   . Respiratory failure, acute (HCC) 07/21/2015    Past Surgical History  Procedure Laterality Date  . Thyroidectomy    . Av fistula placement Left 09/10/2015    Procedure: BRACHIOCEPHALIC ARTERIOVENOUS (AV) FISTULA CREATION;  Surgeon: Nada Libman, MD;  Location: [facility] OR;  Service: Vascular;  Laterality: Left;    Family history: family history includes Cancer in his father; Diabetes in his mother; Heart attack in his brother; Hypertension in his mother; Kidney failure in his mother.  Social History: Patient lives with his wife, daughter, and granddaughter. He is a remote former smoker. 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 He walks with a walker.       Physical Exam: BP 120/54 mmHg  Pulse 72  Temp(Src) 100.9 F (38.3 C) (Rectal)  Resp 16  SpO2 99% General appearance: Thin adult male, awake and oriented to date, "hospital", and person.  No acute distress.   Eyes: Anicteric, conjunctiva pink, lids and lashes normal.     ENT: No nasal deformity, discharge, or epistaxis.  OP moist without lesions.  Dentition poor. Skin: Warm and moist.  No skin sores on back, legs. Cardiac: RRR, nl S1-S2, no murmurs appreciated.  Capillary refill is brisk.   Respiratory: Normal respiratory rate and rhythm.  CTAB without rales or wheezes. Abdomen: Abdomen soft without rigidity.  No TTP. No ascites, distension.   MSK: No deformities or effusions. Neuro: 3-item recall 3/3.  Oriented to person, place, time, but slow to respond.  Follows commands.  Globally weak but symmetric.  Attention normal.  Speech is fluent.     Psych: Behavior appropriate.  Affect normal.  No evidence of aural or visual hallucinations or delusions.       Labs on Admission:  The metabolic panel shows hypokalemia, elevated creatinine. The AST is slightly over the upper limit of normal, albumin is low, ALT  and total bilirubin are normal. The complete blood count shows chronic normocytic anemia, no leukocytosis.   Radiological Exams on Admission: Personally reviewed: Dg Chest 2 View 10/20/2015   IMPRESSION: Vascular congestion and cardiomegaly. Small left pleural effusion suspected. Increased interstitial markings likely reflect pulmonary edema. Electronically Signed   By: [name] M.D.   On: 10/20/2015 22:00    Ct Head Wo Contrast 10/20/2015   NAICP  EKG: Independently reviewed. Atrial fibrillation, rate 136. Poor quality.    Assessment/Plan 1. Early systemic infection without end organ failure, sepsis doubted:  This is new.  Suspected source unknown, suspect blood versus viral syndrome. Organism unknown. Patient meets SIRS criteria given tachycardia, tachypnea, fever, but without organ dysfunction (I do not find him  altered or not mentating normally).  Blood and urine cultures drawn.  Lactate normal.  MAP > 65 mmHg.  At the time of my evaluation, the patient does not have signs of hemodynamic compromise or end-organ dysfunction and is stable for a medical-surgical bed.   -Vancomycin and Zosyn for empiric coverage of possible bloodstream infection  -Admit to telemetry -Follow blood cultures   2. T2DM:  Normoglycemic at admission -Hold glipizide while inpatient -Low dose sliding corrections as needed  3. HTN:  Hemodynamically stable -Continue home carvedilol  4. Chronic diastolic CHF:  Euvolemic by exam.  Small IV bolus given in ER. -Continue carvedilol -HD per nephrology on Friday  5. Afib:  Stable. CHADS2Vasc 4. -Continue warfarin and BB  6. ESRD on HD:  -Continue phoslo  -Consult to Nephrology for maintenance HD  7. Hypokalemia:  -HD per nephrology  8. Elevated AST: Unclear etiology. -Trend CMP   DVT PPx: Warfarin Diet: Renal Consultants: Nephrology Code Status: Full Family Communication: Son and daughter, present at bedside.  Medical decision making:  What exists of the patient's previous chart was reviewed in depth and the case was discussed with Dr. Clarene Duke. Patient seen 12:24 AM on 10/21/2015.  Disposition Plan:  Admit to telemetry for suspected infection.  Rule out bacteremia.  On empiric antibiotics.  Anticipate 3-4 days hospitalization.      Alberteen Sam [facility] Pager [phone]

## 2015-10-21 NOTE — Progress Notes (Signed)
ANTICOAGULATION CONSULT NOTE - Initial Consult  Pharmacy Consult for Warfarin  Indication: atrial fibrillation  Allergies  Allergen Reactions  . Tomato Other (See Comments)    Too much acid - cannot take due to kidney issues    Patient Measurements: Height: 5\' 9"  (175.3 cm) Weight: 163 lb 4.8 oz (74.072 kg) IBW/kg (Calculated) : 70.7   Vital Signs: Temp: 98.1 F (36.7 C) (01/05 0108) Temp Source: Oral (01/05 0108) BP: 108/63 mmHg (01/05 0108) Pulse Rate: 91 (01/05 0108)  Labs:  Recent Labs  10/19/15 10/20/15 2004 10/20/15 2232  HGB  --  12.4*  --   HCT  --  39.9  --   PLT  --  175  --   LABPROT  --   --  28.5*  INR 3.1  --  2.73*  CREATININE  --  3.38*  --     Estimated Creatinine Clearance: 18.3 mL/min (by C-G formula based on Cr of 3.38).   Medical History: Past Medical History  Diagnosis Date  . CAD (coronary artery disease)     a. s/p PCI to mid and mid-distal RCA, 40% sten L main, no sig dz LCx  b. myoview 11/24/2014 inferoapex ischemia, medical management due to high risk for contrast nephropathy  . HTN (hypertension)   . Hyperlipidemia   . Cerebrovascular accident (HCC)   . CHF (congestive heart failure) (HCC)     EF 35% echo 2012 ; EF 44% myoview 2012; [facility]  . Diabetes mellitus   . 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 Unspecified hypothyroidism   . Gout   . Respiratory failure, acute (HCC) 07/21/2015     Assessment: 80 y/o M on warfarin PTA for afib, here with altered mental status, warfarin dose per outpatient anti-coag notes is 5 mg Mon/Wed/Fri and 7.5 mg all other days, other labs/meds reviewed. INR is therapeutic on admit at 2.73.   Goal of Therapy:  INR 2-3 Monitor platelets by anticoagulation protocol: Yes   Plan:  -Continue warfarin at above dosage -Daily PT/INR -Monitor for bleeding  Abran DukeLedford, Kadeisha Betsch 10/21/2015,1:52 AM

## 2015-10-22 DIAGNOSIS — R509 Fever, unspecified: Secondary | ICD-10-CM

## 2015-10-22 DIAGNOSIS — E1122 Type 2 diabetes mellitus with diabetic chronic kidney disease: Secondary | ICD-10-CM

## 2015-10-22 LAB — CBC
HCT: 35.1 % — ABNORMAL LOW (ref 39.0–52.0)
HEMOGLOBIN: 10.5 g/dL — AB (ref 13.0–17.0)
MCH: 29.2 pg (ref 26.0–34.0)
MCHC: 29.9 g/dL — AB (ref 30.0–36.0)
MCV: 97.5 fL (ref 78.0–100.0)
PLATELETS: 145 10*3/uL — AB (ref 150–400)
RBC: 3.6 MIL/uL — ABNORMAL LOW (ref 4.22–5.81)
RDW: 17.3 % — ABNORMAL HIGH (ref 11.5–15.5)
WBC: 4.8 10*3/uL (ref 4.0–10.5)

## 2015-10-22 LAB — BASIC METABOLIC PANEL
Anion gap: 14 (ref 5–15)
BUN: 44 mg/dL — AB (ref 6–20)
CHLORIDE: 98 mmol/L — AB (ref 101–111)
CO2: 27 mmol/L (ref 22–32)
Calcium: 8.8 mg/dL — ABNORMAL LOW (ref 8.9–10.3)
Creatinine, Ser: 5.86 mg/dL — ABNORMAL HIGH (ref 0.61–1.24)
GFR calc Af Amer: 10 mL/min — ABNORMAL LOW (ref >60)
GFR calc non Af Amer: 8 mL/min — ABNORMAL LOW (ref >60)
GLUCOSE: 111 mg/dL — AB (ref 65–99)
POTASSIUM: 3.1 mmol/L — AB (ref 3.5–5.1)
SODIUM: 139 mmol/L (ref 135–145)

## 2015-10-22 LAB — URINE CULTURE: Culture: NO GROWTH

## 2015-10-22 LAB — PROTIME-INR
INR: 3.53 — AB (ref 0.00–1.49)
PROTHROMBIN TIME: 34.6 s — AB (ref 11.6–15.2)

## 2015-10-22 LAB — URIC ACID: Uric Acid, Serum: 5.9 mg/dL (ref 4.4–7.6)

## 2015-10-22 LAB — TSH: TSH: 3.128 u[IU]/mL (ref 0.350–4.500)

## 2015-10-22 LAB — VITAMIN B12: Vitamin B-12: 284 pg/mL (ref 180–914)

## 2015-10-22 MED ORDER — VANCOMYCIN HCL IN DEXTROSE 750-5 MG/150ML-% IV SOLN
750.0000 mg | Freq: Once | INTRAVENOUS | Status: DC
  Filled 2015-10-22: qty 150

## 2015-10-22 MED ORDER — VANCOMYCIN HCL IN DEXTROSE 750-5 MG/150ML-% IV SOLN
750.0000 mg | INTRAVENOUS | Status: DC
  Administered 2015-10-25: 750 mg via INTRAVENOUS
  Filled 2015-10-22 (×2): qty 150

## 2015-10-22 MED ORDER — VITAMIN B-12 100 MCG PO TABS
500.0000 ug | ORAL_TABLET | Freq: Every day | ORAL | Status: DC
  Administered 2015-10-23 – 2015-10-25 (×3): 500 ug via ORAL
  Filled 2015-10-22: qty 1
  Filled 2015-10-22 (×2): qty 5

## 2015-10-22 NOTE — Discharge Instructions (Signed)

## 2015-10-22 NOTE — Evaluation (Signed)
Physical Therapy Evaluation Patient Details Name: [name] [name] MRN: [id number] DOB: 05/23/1936 Today's Date: 10/22/2015   History of Present Illness  80 year old male with a history of ESRD (MWF), diabetes mellitus, hypertension, diastolic CHF, procedure fibrillation, hypothyroidism presented with generalized weakness.   Clinical Impression  Pt admitted with the above complications. Pt currently with functional limitations due to the deficits listed below (see PT Problem List). Demonstrates minor difficulty with gait, uses RW at baseline, no overt loss of balance noted with this device although very guarded and with flexed posture despite frequent cues. Pt had episode of bowel incontinence. RN notified. Mild dyspnea with exertion, SpO2 93% on room air. States he will have 24/7 supervision as needed at home from multiple family members that live with him. Pt will benefit from skilled PT to increase their independence and safety with mobility to allow discharge to the venue listed below.       Follow Up Recommendations Home health PT;Supervision/Assistance - 24 hour    Equipment Recommendations  None recommended by PT    Recommendations for Other Services OT consult     Precautions / Restrictions Precautions Precautions: Fall Restrictions Weight Bearing Restrictions: No      Mobility  Bed Mobility Overal bed mobility: Needs Assistance Bed Mobility: Supine to Sit     Supine to sit: Supervision     General bed mobility comments: Supervision for safety. Required extra time and use of rail but no physical assist.  Transfers Overall transfer level: Needs assistance Equipment used: Rolling walker (2 wheeled) Transfers: Sit to/from Stand Sit to Stand: Min guard         General transfer comment: Min guard for safety. VC for technique and hand placement. Effortful to stand but once upright pt stable with RW for support. Cues for upright posture with difficulty noted. Performed  from bed x2 and toilet with use of rail.  Ambulation/Gait Ambulation/Gait assistance: Supervision Ambulation Distance (Feet): 80 Feet Assistive device: Rolling walker (2 wheeled) Gait Pattern/deviations: Step-through pattern;Decreased stride length;Trunk flexed Gait velocity: decreased Gait velocity interpretation: Below normal speed for age/gender General Gait Details: Educated on safe DME use with a rolling walker. Intermittent cues for walker placement for proximity and upright posture. Able to step backwards with some difficulty, very guarded but without overt loss of balance. 2/4 dyspnea at end of bout, SpO2 93% on room air (questionable waveform and accuracy however.)  Stairs            Wheelchair Mobility    Modified Rankin (Stroke Patients Only)       Balance Overall balance assessment: Needs assistance Sitting-balance support: No upper extremity supported;Feet supported Sitting balance-Leahy Scale: Good     Standing balance support: No upper extremity supported Standing balance-Leahy Scale: Fair                               Pertinent Vitals/Pain Pain Assessment: No/denies pain    Home Living Family/patient expects to be discharged to:: Private residence Living Arrangements: Children;Spouse/significant other Available Help at Discharge: Family;Available 24 hours/day Type of Home: House Home Access: Stairs to enter Entrance Stairs-Rails: Left Entrance Stairs-Number of Steps: 2 Home Layout: One level Home Equipment: Cane - quad;Hand held Careers information officershower head;Walker - 2 wheels      Prior Function Level of Independence: Independent with assistive device(s)         Comments: Walker to ambulate     Hand Dominance   Dominant  Hand: Right    Extremity/Trunk Assessment   Upper Extremity Assessment: Defer to OT evaluation           Lower Extremity Assessment: Generalized weakness         Communication   Communication: No difficulties   Cognition Arousal/Alertness: Awake/alert Behavior During Therapy: WFL for tasks assessed/performed Overall Cognitive Status: Impaired/Different from baseline Area of Impairment: Orientation Orientation Level: Disoriented to;Time;Situation (Unsure of year "1020, I mean 2010")             General Comments: Unaware of bowel incontinence    General Comments General comments (skin integrity, edema, etc.): Pt with episode of bowel incontinence. pericare performed. RN notified    Exercises        Assessment/Plan    PT Assessment Patient needs continued PT services  PT Diagnosis Difficulty walking;Abnormality of gait;Generalized weakness   PT Problem List Decreased strength;Decreased activity tolerance;Decreased balance;Decreased mobility;Decreased cognition;Decreased knowledge of use of DME  PT Treatment Interventions DME instruction;Gait training;Stair training;Functional mobility training;Therapeutic activities;Therapeutic exercise;Balance training;Neuromuscular re-education;Patient/family education;Cognitive remediation   PT Goals (Current goals can be found in the Care Plan section) Acute Rehab PT Goals Patient Stated Goal: Go home PT Goal Formulation: With patient Time For Goal Achievement: 11/05/15 Potential to Achieve Goals: Good    Frequency Min 3X/week   Barriers to discharge        Co-evaluation               End of Session Equipment Utilized During Treatment: Gait belt Activity Tolerance: Patient tolerated treatment well Patient left: in chair;with call bell/phone within reach;with chair alarm set;with family/visitor present Nurse Communication: Mobility status (bowe incontinence)         Time: 4132-4401 PT Time Calculation (min) (ACUTE ONLY): 33 min   Charges:   PT Evaluation $PT Eval Low Complexity: 1 Procedure PT Treatments $Gait Training: 8-22 mins   PT G CodesBerton Mount 10/22/2015, 12:19 PM  Sunday Spill[place],   [phone]

## 2015-10-22 NOTE — Consult Note (Signed)
   [facility]   10/22/2015  [name] [name] 07/24/1936 [id number]  Services declined. Referral received to assess for care management services for the patient's benefit with Medicare ACO. Spoke with patient/wife regarding post hospital follow up care as a covered benefit. Endorses Dr. Tana ConchStephen Hunter as his primary care provider.   Explained that [facility] Care Management does not interfere with or replace any services arranged by the inpatient care management staff.  Patient [facility] with [facility] at this time.  Wife states, "he has someone coming in and we don't need anything extra right now." Will sign off at this time.  For questions, please contact: Charlesetta ShanksVictoria Ankur Snowdon, RN BSN CCM [facility] Lia[facility]  [phone] business mobile phone

## 2015-10-22 NOTE — Progress Notes (Signed)
ANTICOAGULATION + ANTIBIOTIC CONSULT NOTE - Follow Up Consult  Pharmacy Consult for Coumadin;  Vancomycin and Zosyn Indication: atrial fibrillation;  Sepsis coverage  Allergies  Allergen Reactions  . Tomato Other (See Comments)    Too much acid - cannot take due to kidney issues    Patient Measurements: Height: 5\' 9"  (175.3 cm) Weight: 167 lb (75.751 kg) IBW/kg (Calculated) : 70.7  Vital Signs: Temp: 98 F (36.7 C) (01/06 0840) Temp Source: Oral (01/06 0840) BP: 120/62 mmHg (01/06 0840) Pulse Rate: 98 (01/06 0840)  Labs:  Recent Labs  10/20/15 2004 10/20/15 2232 10/21/15 0432 10/22/15 0403  HGB 12.4*  --  10.4* 10.5*  HCT 39.9  --  34.8* 35.1*  PLT 175  --  132* 145*  LABPROT  --  28.5* 29.3* 34.6*  INR  --  2.73* 2.83* 3.53*  CREATININE 3.38*  --  3.99* 5.86*  ESRD  Assessment:  On Coumadin for afib.  INR up to 3.53 today, supratherapeutic.  INR was therapeutic on admit 1/4 pm and again 1/5 am, so usual dose continued.   Received Coumadin 7.5 mg on 10/21/15.      Home Coumadin regimen:  5 mg MWF, 7.5 mg TTSS.    Day #3 Vancomycin and Zosyn for sepsis coverage. Vanc 1500 mg IV loading dose given 1/4 ~11:45pm, and 750 mg IV after each HD planned.  Usual MWF dialysis, but to be dialyzed tomorrow (Saturday) instead of today (Friday).     Tmax 100.5, WBC 4.8.  No growth to date from 1/4 blood cultures. 1/4 urine culture negative.  Goal of Therapy:  INR 2-3 Monitor platelets by anticoagulation protocol: Yes  Pre-dialysis Vancomycin levels 15-25 mcg/ml Appropriate Zosyn dose for renal function and infection   Plan:   No Coumadin today. Standing dose cancelled.  Continue daily PT/INR.  Continue Vancomycin 750 mg IV after each HD. No dose today as no HD planned.     Next Vanc dose after HD tomorrow (Saturday), then MWF beginning 10/25/15.  Will plan to check pre-HD vanc level next week if cultures are positive.  Continue Zosyn 2.25 gm IV q8hrs.  Follow culture data,  progress.  Dennie FettersEgan, Mehlani Blankenburg Donovan, RPh Pager: [phone] 10/22/2015,11:55 AM

## 2015-10-22 NOTE — Progress Notes (Addendum)
PROGRESS NOTE  KAJ VASIL ZOX:[id number] DOB: [birth year] DOA: 10/20/2015 PCP: [name], MD Brief History 80 year old male with a history of ESRD (MWF), diabetes mellitus, hypertension, diastolic CHF, procedure fibrillation, hypothyroidism presented with generalized weakness. Apparently, the patient has been feeling weak for the past 2-3 days prior to admission. After dialysis on 10/20/2015, the patient had much difficulty getting out of his car secondary to generalized weakness. Upon arrival to the emergency department, the patient was confused and cannot recall the events of the day. It took him 20 minutes to get out of the car, then 20 minutes to get into the house, and he just seemed like he couldn't walk, urinated on himself, had the shakes, and was incoherent. He was noted to be tachycardic with a temperature 100.1F and tachypnea. He states that aside from his feelings of weakness prior to admission, he was in his usual state of health. He denied any fevers, chills, chest pain, shortness breath, coughing, hemoptysis, nausea, vomiting, diarrhea, abdominal pain, dysuria, headache, neck pain, rashes. Assessment/Plan: SIRS -Suspect bloodstream infection versus nonspecific viral infection. -Continue empiric vancomycin and Zosyn pending culture data--plan to d/c abx in 24-48 hrs if cultures remain neg and pt is clinically stable -still had low grade temp 100.5 evening of 10/21/15 Diabetes mellitus type 2 -Hemoglobin A1c--pending -Patient normally takes glipizide at home -NovoLog sliding scale Chronic systolic and diastolic CHF -Appears clinically Compensated -07/22/2015 echocardiogram EF 40-45%, PAP 54 -Continue carvedilol ESRD -Appreciate Nephrology consult for maintenance dialysis -Dialysis was initiated during his admission on 09/03/2015 -Fistula was created, but not yet mature Chronic Atrial fibrillation -CHADS-VASc = 8 -continue coumadin -rate  controlled Hypertension -Controlled -Continue carvedilol Hypothyroidism -Continue Synthroid Hyperlipidemia -Continue statin L-Knee Pain -??fall onto knee -uric acid--5.9 -xray left knee Hypokalemia -replete Anemia of CKD -B12 marginally low -supplement  Family Communication: Pt at beside Disposition Plan: Home when medically stable    Procedures/Studies: Dg Chest 2 View  10/20/2015  CLINICAL DATA:  Acute onset of altered mental status. Tachycardia. Initial encounter. EXAM: CHEST  2 VIEW COMPARISON:  Chest radiograph performed 09/04/2015 FINDINGS: The lungs are well-aerated. Vascular congestion is noted. A small left pleural effusion is suspected. Increased interstitial markings likely reflect pulmonary edema. There is no evidence of pneumothorax. The heart is enlarged. No acute osseous abnormalities are seen. A right-sided dual-lumen catheter is noted ending about the cavoatrial junction. IMPRESSION: Vascular congestion and cardiomegaly. Small left pleural effusion suspected. Increased interstitial markings likely reflect pulmonary edema. Electronically Signed   By: [name] M.D.   On: 10/20/2015 22:00   Ct Head Wo Contrast  10/20/2015  CLINICAL DATA:  Altered mental status EXAM: CT HEAD WITHOUT CONTRAST TECHNIQUE: Contiguous axial images were obtained from the base of the skull through the vertex without intravenous contrast. COMPARISON:  Head CT July 19, 2015; brain MRI July 20, 2015 FINDINGS: There is stable age related volume loss. There is no intracranial mass, hemorrhage, extra-axial fluid collection, or midline shift. There is stable slight small vessel disease in the centra semiovale bilaterally. Elsewhere gray-white compartments are normal. No acute infarct is evident. Calcification along the falx is stable. Bony calvarium appears intact. The mastoid air cells are clear. No intraorbital lesions are identified. IMPRESSION: Slight periventricular small vessel disease,  stable. No intracranial mass, hemorrhage, or new gray - white compartment lesions/acute appearing infarct. Stable appearance compared to prior studies. Electronically Signed   By: Bretta Bang III M.D.   On:  10/20/2015 21:26         Subjective: Patient denies fevers, chills, headache, chest pain, dyspnea, nausea, vomiting, diarrhea, abdominal pain, dysuria, hematuria.  States that left knee pain is improved   Objective: Filed Vitals:   10/22/15 0433 10/22/15 0840 10/22/15 1511 10/22/15 2049  BP: 109/74 120/62 116/58 112/66  Pulse: 92 98 81 99  Temp: 99.1 F (37.3 C) 98 F (36.7 C) 98.5 F (36.9 C) 99.2 F (37.3 C)  TempSrc: Oral Oral Oral Oral  Resp: 17 16 17 18   Height:      Weight:    74.526 kg (164 lb 4.8 oz)  SpO2: 100% 98% 96% 95%    Intake/Output Summary (Last 24 hours) at 10/22/15 2112 Last data filed at 10/22/15 2050  Gross per 24 hour  Intake   1142 ml  Output    350 ml  Net    792 ml   Weight change: 1.678 kg (3 lb 11.2 oz) Exam:   General:  Pt is alert, follows commands appropriately, not in acute distress  HEENT: No icterus, No thrush, No neck mass, [place]/AT  Cardiovascular: RRR, S1/S2, no rubs, no gallops  Respiratory: CTA bilaterally, no wheezing, no crackles, no rhonchi  Abdomen: Soft/+BS, non tender, non distended, no guarding; no HSM  Extremities: No edema, No lymphangitis, No petechiae, No rashes, no synovitis  Data Reviewed: Basic Metabolic Panel:  Recent Labs Lab 10/20/15 2004 10/21/15 0432 10/22/15 0403  NA 136 138 139  K 3.1* 2.5* 3.1*  CL 94* 98* 98*  CO2 28 28 27   GLUCOSE 148* 74 111*  BUN 25* 31* 44*  CREATININE 3.38* 3.99* 5.86*  CALCIUM 9.0 8.5* 8.8*   Liver Function Tests:  Recent Labs Lab 10/20/15 2004  AST 45*  ALT 24  ALKPHOS 91  BILITOT 0.8  PROT 7.8  ALBUMIN 2.6*   No results for input(s): LIPASE, AMYLASE in the last 168 hours. No results for input(s): AMMONIA in the last 168 hours. CBC:  Recent  Labs Lab 10/20/15 2004 10/21/15 0432 10/22/15 0403  WBC 5.7 4.3 4.8  NEUTROABS 3.9  --   --   HGB 12.4* 10.4* 10.5*  HCT 39.9 34.8* 35.1*  MCV 97.8 98.9 97.5  PLT 175 132* 145*   Cardiac Enzymes: No results for input(s): CKTOTAL, CKMB, CKMBINDEX, TROPONINI in the last 168 hours. BNP: Invalid input(s): POCBNP CBG:  Recent Labs Lab 10/21/15 0106  GLUCAP 94    Recent Results (from the past 240 hour(s))  Urine culture     Status: None   Collection Time: 10/20/15  9:15 PM  Result Value Ref Range Status   Specimen Description URINE, RANDOM  Final   Special Requests NONE  Final   Culture NO GROWTH 2 DAYS  Final   Report Status 10/22/2015 FINAL  Final  Culture, blood (routine x 2)     Status: None (Preliminary result)   Collection Time: 10/20/15 10:00 PM  Result Value Ref Range Status   Specimen Description BLOOD RIGHT ARM  Final   Special Requests BOTTLES DRAWN AEROBIC AND ANAEROBIC  Final   Culture NO GROWTH 2 DAYS  Final   Report Status PENDING  Incomplete  Culture, blood (routine x 2)     Status: None (Preliminary result)   Collection Time: 10/20/15 10:24 PM  Result Value Ref Range Status   Specimen Description BLOOD RIGHT HAND  Final   Special Requests BOTTLES DRAWN AEROBIC AND ANAEROBIC  Final   Culture NO GROWTH 2  DAYS  Final   Report Status PENDING  Incomplete  MRSA PCR Screening     Status: None   Collection Time: 10/21/15  1:41 AM  Result Value Ref Range Status   MRSA by PCR NEGATIVE NEGATIVE Final    Comment:        The GeneXpert MRSA Assay (FDA approved for NASAL specimens only), is one component of a comprehensive MRSA colonization surveillance program. It is not intended to diagnose MRSA infection nor to guide or monitor treatment for MRSA infections.      Scheduled Meds: . calcium acetate  667 mg Oral TID WC  . carvedilol  12.5 mg Oral BID WC  . levothyroxine  175 mcg Oral QAC breakfast  . piperacillin-tazobactam (ZOSYN)  IV  2.25 g  Intravenous Q8H  . rosuvastatin  20 mg Oral Daily  . sodium chloride  3 mL Intravenous Q12H  . [START ON 10/25/2015] vancomycin  750 mg Intravenous Q M,W,F-HD  . [START ON 10/23/2015] vancomycin  750 mg Intravenous Once  . Warfarin - Pharmacist Dosing Inpatient   Does not apply q1800   Continuous Infusions:    Dreanna Kyllo, DO  [facility] Pager [phone]  If 7PM-7AM, please contact night-coverage [email or web address] Password [email or web address] 10/22/2015, 9:12 PM   LOS: 2 days

## 2015-10-22 NOTE — Care Management Important Message (Signed)
Important Message  Patient Details  Name: Ernestine ConradRoy L Heberlein MRN: [id number] Date of Birth: 05/08/1936   Medicare Important Message Given:  Yes    Glory Graefe P Jacquelyn Shadrick 10/22/2015, 2:36 PM

## 2015-10-22 NOTE — Consult Note (Signed)
[facility] Renal Consultation Note  Indication for Consultation:  Management of ESRD/hemodialysis; anemia, hypertension/volume and secondary hyperparathyroidism  HPI: [name] [name] is a 80 y.o. male admitted 10/21/15 with fever 100.9/ weakness . He recently started HD 11/22/16at [facility]/ he completed his HD tx at [facility] (compliant with txs) family at home reported Urine incontinence and severe  Weakness brought  to ER .CXR no focal opacity/ Bld and urine cultures so far no growth.  At outpt hd has been afebrile and he reports feeling better since beginning HD.  This am "feel stronger than yesterday. "     Past Medical History  Diagnosis Date  . CAD (coronary artery disease)     a. s/p PCI to mid and mid-distal RCA, 40% sten L main, no sig dz LCx  b. myoview 11/24/2014 inferoapex ischemia, medical management due to high risk for contrast nephropathy  . HTN (hypertension)   . Hyperlipidemia   . Cerebrovascular accident ([place])   . CHF (congestive heart failure) ([place])     EF 35% echo 2012 ; EF 44% myoview 2012; EF 76% by [facility] November 2014  . Diabetes mellitus   . 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 Unspecified hypothyroidism   . Gout   . Respiratory failure, acute ([place]) 07/21/2015    Past Surgical History  Procedure Laterality Date  . Thyroidectomy    . Av fistula placement Left 09/10/2015    Procedure: BRACHIOCEPHALIC ARTERIOVENOUS (AV) FISTULA CREATION;  Surgeon: Serafina Mitchell, MD;  Location: [facility] OR;  Service: Vascular;  Laterality: Left;      Family History  Problem Relation Age of Onset  . Kidney failure Mother   . Diabetes Mother   . Cancer Father     lung  . Heart attack Brother     massive heart attack in 16s  . Hypertension Mother       reports that he quit smoking about 62 years ago. His smoking use included Cigarettes. He smoked 0.50 packs per day. He does not have any smokeless tobacco history on file. He reports that he does not drink  alcohol or use illicit drugs.   Allergies  Allergen Reactions  . Tomato Other (See Comments)    Too much acid - cannot take due to kidney issues    Prior to Admission medications   Medication Sig Start Date End Date Taking? Authorizing Provider  calcium acetate (PHOSLO) 667 MG capsule Take 1 capsule (667 mg total) by mouth 3 (three) times daily with meals. 09/30/15  Yes Marin Olp, MD  carvedilol (COREG) 12.5 MG tablet Take 1 tablet (12.5 mg total) by mouth 2 (two) times daily with a meal. 01/11/15  Yes Jolaine Artist, MD  glipiZIDE (GLUCOTROL) 5 MG tablet Take 2.5-5 mg by mouth 2 (two) times daily before a meal. Take 1/2 tablet (2.5 mg) daily before breakfast and 1 tablet (5 mg) before supper 05/01/15  Yes Historical Provider, MD  levothyroxine (SYNTHROID, LEVOTHROID) 175 MCG tablet Take 1 tablet (175 mcg total) by mouth daily. 08/13/15  Yes Marin Olp, MD  multivitamin (RENA-VIT) TABS tablet Take 1 tablet by mouth daily.   Yes Historical Provider, MD  nitroGLYCERIN (NITROSTAT) 0.4 MG SL tablet Place 1 tablet (0.4 mg total) under the tongue every 5 (five) minutes as needed for chest pain. 11/25/14 04/25/17 Yes Almyra Deforest, PA  olopatadine (PATANOL) 0.1 % ophthalmic solution Place 1 drop into both eyes 2 (two) times daily.  Patient taking differently: Place 1 drop into both eyes daily.  04/20/15  Yes Marin Olp, MD  rosuvastatin (CRESTOR) 20 MG tablet Take 1 tablet (20 mg total) by mouth daily. 10/07/15  Yes Lelon Perla, MD  warfarin (COUMADIN) 5 MG tablet Take 1 tablet by mouth daily or as directed by coumadin clinic Patient taking differently: Take 5 mg by mouth daily.  09/17/15  Yes Lelon Perla, MD     Anti-infectives    Start     Dose/Rate Route Frequency Ordered Stop   10/22/15 1200  vancomycin (VANCOCIN) IVPB 750 mg/150 ml premix     750 mg 150 mL/hr over 60 Minutes Intravenous Every M-W-F (Hemodialysis) 10/21/15 0526     10/21/15 0630  piperacillin-tazobactam  (ZOSYN) IVPB 2.25 g     2.25 g 100 mL/hr over 30 Minutes Intravenous Every 8 hours 10/21/15 0526     10/20/15 2230  piperacillin-tazobactam (ZOSYN) IVPB 3.375 g     3.375 g 100 mL/hr over 30 Minutes Intravenous  Once 10/20/15 2218 10/20/15 2342   10/20/15 2230  vancomycin (VANCOCIN) IVPB 1000 mg/200 mL premix  Status:  Discontinued     1,000 mg 200 mL/hr over 60 Minutes Intravenous  Once 10/20/15 2218 10/20/15 2224   10/20/15 2230  vancomycin (VANCOCIN) 1,500 mg in sodium chloride 0.9 % 500 mL IVPB     1,500 mg 250 mL/hr over 120 Minutes Intravenous  Once 10/20/15 2224 10/21/15 0143      Results for orders placed or performed during the hospital encounter of 10/20/15 (from the past 48 hour(s))  Comprehensive metabolic panel     Status: Abnormal   Collection Time: 10/20/15  8:04 PM  Result Value Ref Range   Sodium 136 135 - 145 mmol/L   Potassium 3.1 (L) 3.5 - 5.1 mmol/L   Chloride 94 (L) 101 - 111 mmol/L   CO2 28 22 - 32 mmol/L   Glucose, Bld 148 (H) 65 - 99 mg/dL   BUN 25 (H) 6 - 20 mg/dL   Creatinine, Ser 3.38 (H) 0.61 - 1.24 mg/dL   Calcium 9.0 8.9 - 10.3 mg/dL   Total Protein 7.8 6.5 - 8.1 g/dL   Albumin 2.6 (L) 3.5 - 5.0 g/dL   AST 45 (H) 15 - 41 U/L   ALT 24 17 - 63 U/L   Alkaline Phosphatase 91 38 - 126 U/L   Total Bilirubin 0.8 0.3 - 1.2 mg/dL   GFR calc non Af Amer 16 (L) >60 mL/min   GFR calc Af Amer 19 (L) >60 mL/min    Comment: (NOTE) The eGFR has been calculated using the CKD EPI equation. This calculation has not been validated in all clinical situations. eGFR's persistently <60 mL/min signify possible Chronic Kidney Disease.    Anion gap 14 5 - 15  CBC with Differential     Status: Abnormal   Collection Time: 10/20/15  8:04 PM  Result Value Ref Range   WBC 5.7 4.0 - 10.5 K/uL   RBC 4.08 (L) 4.22 - 5.81 MIL/uL   Hemoglobin 12.4 (L) 13.0 - 17.0 g/dL   HCT 39.9 39.0 - 52.0 %   MCV 97.8 78.0 - 100.0 fL   MCH 30.4 26.0 - 34.0 pg   MCHC 31.1 30.0 - 36.0  g/dL   RDW 17.1 (H) 11.5 - 15.5 %   Platelets 175 150 - 400 K/uL   Neutrophils Relative % 67 %   Neutro Abs 3.9 1.7 - 7.7 K/uL  Lymphocytes Relative 20 %   Lymphs Abs 1.1 0.7 - 4.0 K/uL   Monocytes Relative 9 %   Monocytes Absolute 0.5 0.1 - 1.0 K/uL   Eosinophils Relative 4 %   Eosinophils Absolute 0.2 0.0 - 0.7 K/uL   Basophils Relative 0 %   Basophils Absolute 0.0 0.0 - 0.1 K/uL  Urine culture     Status: None   Collection Time: 10/20/15  9:15 PM  Result Value Ref Range   Specimen Description URINE, RANDOM    Special Requests NONE    Culture NO GROWTH 2 DAYS    Report Status 10/22/2015 FINAL   Culture, blood (routine x 2)     Status: None (Preliminary result)   Collection Time: 10/20/15 10:00 PM  Result Value Ref Range   Specimen Description BLOOD RIGHT ARM    Special Requests BOTTLES DRAWN AEROBIC AND ANAEROBIC 5ML    Culture NO GROWTH < 24 HOURS    Report Status PENDING   Culture, blood (routine x 2)     Status: None (Preliminary result)   Collection Time: 10/20/15 10:24 PM  Result Value Ref Range   Specimen Description BLOOD RIGHT HAND    Special Requests BOTTLES DRAWN AEROBIC AND ANAEROBIC 4ML    Culture NO GROWTH < 24 HOURS    Report Status PENDING   I-Stat CG4 Lactic Acid, ED     Status: None   Collection Time: 10/20/15 10:29 PM  Result Value Ref Range   Lactic Acid, Venous 1.92 0.5 - 2.0 mmol/L  Protime-INR     Status: Abnormal   Collection Time: 10/20/15 10:32 PM  Result Value Ref Range   Prothrombin Time 28.5 (H) 11.6 - 15.2 seconds   INR 2.73 (H) 0.00 - 1.49  Glucose, capillary     Status: None   Collection Time: 10/21/15  1:06 AM  Result Value Ref Range   Glucose-Capillary 94 65 - 99 mg/dL  MRSA PCR Screening     Status: None   Collection Time: 10/21/15  1:41 AM  Result Value Ref Range   MRSA by PCR NEGATIVE NEGATIVE    Comment:        The GeneXpert MRSA Assay (FDA approved for NASAL specimens only), is one component of a comprehensive MRSA  colonization surveillance program. It is not intended to diagnose MRSA infection nor to guide or monitor treatment for MRSA infections.   CBC     Status: Abnormal   Collection Time: 10/21/15  4:32 AM  Result Value Ref Range   WBC 4.3 4.0 - 10.5 K/uL   RBC 3.52 (L) 4.22 - 5.81 MIL/uL   Hemoglobin 10.4 (L) 13.0 - 17.0 g/dL   HCT 34.8 (L) 39.0 - 52.0 %   MCV 98.9 78.0 - 100.0 fL   MCH 29.5 26.0 - 34.0 pg   MCHC 29.9 (L) 30.0 - 36.0 g/dL   RDW 17.3 (H) 11.5 - 15.5 %   Platelets 132 (L) 150 - 400 K/uL  Basic metabolic panel     Status: Abnormal   Collection Time: 10/21/15  4:32 AM  Result Value Ref Range   Sodium 138 135 - 145 mmol/L   Potassium 2.5 (LL) 3.5 - 5.1 mmol/L    Comment: CRITICAL RESULT CALLED TO, READ BACK BY AND VERIFIED WITH: T.CHANEY,RN 0730 10/21/15 CLARK,S    Chloride 98 (L) 101 - 111 mmol/L   CO2 28 22 - 32 mmol/L   Glucose, Bld 74 65 - 99 mg/dL   BUN 31 (H) 6 -  20 mg/dL   Creatinine, Ser 0.66 (H) 0.61 - 1.24 mg/dL   Calcium 8.5 (L) 8.9 - 10.3 mg/dL   GFR calc non Af Amer 13 (L) >60 mL/min   GFR calc Af Amer 15 (L) >60 mL/min    Comment: (NOTE) The eGFR has been calculated using the CKD EPI equation. This calculation has not been validated in all clinical situations. eGFR's persistently <60 mL/min signify possible Chronic Kidney Disease.    Anion gap 12 5 - 15  Protime-INR     Status: Abnormal   Collection Time: 10/21/15  4:32 AM  Result Value Ref Range   Prothrombin Time 29.3 (H) 11.6 - 15.2 seconds   INR 2.83 (H) 0.00 - 1.49  Protime-INR     Status: Abnormal   Collection Time: 10/22/15  4:03 AM  Result Value Ref Range   Prothrombin Time 34.6 (H) 11.6 - 15.2 seconds   INR 3.53 (H) 0.00 - 1.49  Uric acid     Status: None   Collection Time: 10/22/15  4:03 AM  Result Value Ref Range   Uric Acid, Serum 5.9 4.4 - 7.6 mg/dL  CBC     Status: Abnormal   Collection Time: 10/22/15  4:03 AM  Result Value Ref Range   WBC 4.8 4.0 - 10.5 K/uL   RBC 3.60 (L)  4.22 - 5.81 MIL/uL   Hemoglobin 10.5 (L) 13.0 - 17.0 g/dL   HCT 15.1 (L) 11.0 - 45.1 %   MCV 97.5 78.0 - 100.0 fL   MCH 29.2 26.0 - 34.0 pg   MCHC 29.9 (L) 30.0 - 36.0 g/dL   RDW 10.4 (H) 75.7 - 47.1 %   Platelets 145 (L) 150 - 400 K/uL  Basic metabolic panel     Status: Abnormal   Collection Time: 10/22/15  4:03 AM  Result Value Ref Range   Sodium 139 135 - 145 mmol/L   Potassium 3.1 (L) 3.5 - 5.1 mmol/L    Comment: DELTA CHECK NOTED   Chloride 98 (L) 101 - 111 mmol/L   CO2 27 22 - 32 mmol/L   Glucose, Bld 111 (H) 65 - 99 mg/dL   BUN 44 (H) 6 - 20 mg/dL   Creatinine, Ser 3.93 (H) 0.61 - 1.24 mg/dL   Calcium 8.8 (L) 8.9 - 10.3 mg/dL   GFR calc non Af Amer 8 (L) >60 mL/min   GFR calc Af Amer 10 (L) >60 mL/min    Comment: (NOTE) The eGFR has been calculated using the CKD EPI equation. This calculation has not been validated in all clinical situations. eGFR's persistently <60 mL/min signify possible Chronic Kidney Disease.    Anion gap 14 5 - 15  Vitamin B12     Status: None   Collection Time: 10/22/15  4:03 AM  Result Value Ref Range   Vitamin B-12 284 180 - 914 pg/mL    Comment: (NOTE) This assay is not validated for testing neonatal or myeloproliferative syndrome specimens for Vitamin B12 levels.   TSH     Status: None   Collection Time: 10/22/15  4:03 AM  Result Value Ref Range   TSH 3.128 0.350 - 4.500 uIU/mL     ROS: Pt poor historian /but no reported cough , sob, chest pain , abd pian ,N/V/D. Was in usual state of health until 10/20/15 admit  Physical Exam: Filed Vitals:   10/22/15 0433 10/22/15 0840  BP: 109/74 120/62  Pulse: 92 98  Temp: 99.1 F (37.3 C) 98 F (36.7  C)  Resp: 17 16     General: alert elderly AAM/ NAD  HEENT: [place] /eomi/ [facility] Neck: supple no jvd Heart: Irreg/irreg Vr 80 ,no rub, gallop or mur Lungs: Few basilar crackles Abdomen: bs pos , soft , NT, ND Extremities: no pedal edema  Skin: no pedal ulcers or overt rash Neuro: alert OX3,  moves all extrem.  Dialysis Access: R IJ perm cath nontender, no dc seen at exit site / L UA  AF pos bruit   Dialysis Orders: Center: [facility]  on MWF . EDW 75.5 kg HD Bath 2.0k, 2.25ca  Time 4hrs Heparin 1500 . Access LUA AVF / RIJ perm cath      Hectorol 35mg IV/HD / no esa  Venofer 100 mg load finish    Units IV/HD  Venofer  100 mg  q hd last dose to be 11/01/15   Other= op lab 11.3 hgb 10/20/15  tfs 13%/ ca 9.9 phos 3.9  pth 297   Assessment/Plan 1. Febrile Illness= ? Sepsis vs Viral  -admit team has on Zosyn/ Vancomycin  IV    bc and urine cs no growth so far 2. ESRD - MWF  Hd / hd today  K 2.5 given po k to 3.1 today / use 4.0 k bath  Fu k labs  3. Hypertension/volume  - am bp lowish 109/74  Hold coreg 12.5 pre hd and for bp < 100 , wt  At  edw by wt 75.5 yest / Get standing  wts for hd / cxr on admit sm pl effusion /intersttial markings ?pul edema / attempt 1 to 2 l uf  4. Anemia  - hgb 10.5 not on esa as op monitor hgbs / venofer load as op   5. Metabolic bone disease -  Low dose vit D and phoslo  As binder 6. A. Fib= on comadin and coreg  7. DM type 2= per admit 8. Hypothyroidism = per admit   DErnest Haber PA-C [id number]/03/2016, 9:52 AM  I have seen and examined this patient and agree with plan per DErnest Haber  89yoBM with ESRD admitted yest with fever and confusion.  [facility]  Agree with AB and will await BC.  If BC + then consider removing catheter.  AVF placed 11/25 so not ready to use just yet.  Will plan HD tomorrow and get back on MWF next week.  Seleta Hovland T,MD 10/22/2015 11:39 AM

## 2015-10-23 DIAGNOSIS — I4891 Unspecified atrial fibrillation: Secondary | ICD-10-CM

## 2015-10-23 DIAGNOSIS — I482 Chronic atrial fibrillation: Secondary | ICD-10-CM

## 2015-10-23 LAB — BASIC METABOLIC PANEL
Anion gap: 15 (ref 5–15)
BUN: 60 mg/dL — ABNORMAL HIGH (ref 6–20)
CALCIUM: 8.9 mg/dL (ref 8.9–10.3)
CO2: 26 mmol/L (ref 22–32)
CREATININE: 7.4 mg/dL — AB (ref 0.61–1.24)
Chloride: 99 mmol/L — ABNORMAL LOW (ref 101–111)
GFR, EST AFRICAN AMERICAN: 7 mL/min — AB (ref >60)
GFR, EST NON AFRICAN AMERICAN: 6 mL/min — AB (ref >60)
GLUCOSE: 119 mg/dL — AB (ref 65–99)
Potassium: 3.6 mmol/L (ref 3.5–5.1)
Sodium: 140 mmol/L (ref 135–145)

## 2015-10-23 LAB — HEMOGLOBIN A1C
Hgb A1c MFr Bld: 5.9 % — ABNORMAL HIGH (ref 4.8–5.6)
MEAN PLASMA GLUCOSE: 123 mg/dL

## 2015-10-23 LAB — PROTIME-INR
INR: 3.69 — AB (ref 0.00–1.49)
PROTHROMBIN TIME: 35.8 s — AB (ref 11.6–15.2)

## 2015-10-23 LAB — MRSA PCR SCREENING: MRSA by PCR: NEGATIVE

## 2015-10-23 MED ORDER — DILTIAZEM LOAD VIA INFUSION
10.0000 mg | Freq: Once | INTRAVENOUS | Status: AC
  Administered 2015-10-23: 10 mg via INTRAVENOUS
  Filled 2015-10-23: qty 10

## 2015-10-23 MED ORDER — VANCOMYCIN HCL IN DEXTROSE 750-5 MG/150ML-% IV SOLN
750.0000 mg | INTRAVENOUS | Status: AC | PRN
  Administered 2015-10-23: 750 mg via INTRAVENOUS
  Filled 2015-10-23 (×2): qty 150

## 2015-10-23 MED ORDER — CETYLPYRIDINIUM CHLORIDE 0.05 % MT LIQD
7.0000 mL | Freq: Two times a day (BID) | OROMUCOSAL | Status: DC
  Administered 2015-10-24 – 2015-10-25 (×3): 7 mL via OROMUCOSAL

## 2015-10-23 MED ORDER — DILTIAZEM HCL 100 MG IV SOLR
5.0000 mg/h | INTRAVENOUS | Status: DC
  Administered 2015-10-23 – 2015-10-24 (×2): 5 mg/h via INTRAVENOUS
  Filled 2015-10-23 (×2): qty 100

## 2015-10-23 NOTE — Progress Notes (Signed)
Physical Cancellation Therapy Note  Patient to hemodialysis. Will follow and progress during this admission.   [address]Raylee Strehl Secor [place], [place] CarolinaPT [phone]  10/23/2015  11:41

## 2015-10-23 NOTE — Progress Notes (Signed)
Attempted report. RN unavailable at this time.

## 2015-10-23 NOTE — Progress Notes (Signed)
ANTICOAGULATION CONSULT NOTE - Follow Up Consult  Pharmacy Consult for Coumadin  Indication: atrial fibrillation  Allergies  Allergen Reactions  . Tomato Other (See Comments)    Too much acid - cannot take due to kidney issues    Patient Measurements: Height: 5\' 9"  (175.3 cm) Weight: 164 lb 4.8 oz (74.526 kg) IBW/kg (Calculated) : 70.7  Vital Signs: Temp: 98.2 F (36.8 C) (01/07 0527) Temp Source: Oral (01/07 0527) BP: 116/75 mmHg (01/07 0527) Pulse Rate: 99 (01/07 0527)  Labs:  Recent Labs  10/20/15 2004  10/21/15 0432 10/22/15 0403 10/23/15 0500  HGB 12.4*  --  10.4* 10.5*  --   HCT 39.9  --  34.8* 35.1*  --   PLT 175  --  132* 145*  --   LABPROT  --   < > 29.3* 34.6* 35.8*  INR  --   < > 2.83* 3.53* 3.69*  CREATININE 3.38*  --  3.99* 5.86* 7.40*  < > = values in this interval not displayed.  Estimated Creatinine Clearance: 8.4 mL/min (by C-G formula based on Cr of 7.4).    Assessment: Coumadin for afib as pta. INR 2.73 on admit 1/4 pm and and 2.83 on 1/5 am. Usual dose (7.5 mg) given 1/5. INR SUPERtherapeutic at 3.69 today. Hgb 10.5 and PLT 145 on 1/6. No bleeding noted.   PTA dose: 5 mg MWF, 7.5 mg TTSS  Goal of Therapy:  INR 2-3 Monitor platelets by anticoagulation protocol: Yes   Plan:  - Hold Coumadin again tonight  - Daily PT/INR - Monitor for s/s of bleeding   Kaedyn Polivka C. Marvis MoellerMiles, PharmD Pharmacy Resident  Pager: [phone] 10/23/2015 9:22 AM

## 2015-10-23 NOTE — Progress Notes (Signed)
PROGRESS NOTE  [name] [name]:[id number] DOB: [birth year] DOA: 10/20/2015 PCP: [name], MD  Brief History 80 year old male with a history of ESRD (MWF), diabetes mellitus, hypertension, diastolic CHF, procedure fibrillation, hypothyroidism presented with generalized weakness. Apparently, the patient has been feeling weak for the past 2-3 days prior to admission. After dialysis on 10/20/2015, the patient had much difficulty getting out of his car secondary to generalized weakness. Upon arrival to the emergency department, the patient was confused and cannot recall the events of the day. It took him 20 minutes to get out of the car, then 20 minutes to get into the house, and he just seemed like he couldn't walk, urinated on himself, had the shakes, and was incoherent. He was noted to be tachycardic with a temperature 100.5F and tachypnea. He states that aside from his feelings of weakness prior to admission, he was in his usual state of health. He denied any fevers, chills, chest pain, shortness breath, coughing, hemoptysis, nausea, vomiting, diarrhea, abdominal pain, dysuria, headache, neck pain, rashes. On the afternoon of 10/23/15, the patient was noted to be confused and also had Afib with RVR concurrently.  He was moved to stepdown and placed on diltiazem drip.  Interim Events 1/17 pt developed RVR again with HR 130-140.  Pt confused during episode.  Does not recall events of day.  Denies cp, sob, n/v/d, HA, neck pain, abdominal pain.  Vitals at 1655--98.5-HR 134-RR 18-122/78--98%RA Assessment/Plan: SIRS -Suspect bloodstream infection versus nonspecific viral infection. -Continue empiric vancomycin and Zosyn pending culture data--plan to d/c abx in 24-48 hrs if cultures remain neg and pt is clinically stable -no fever past 36-72 hours, hemodynamically stable -d/c zosyn -renal recommends continuing vanco x 2 weeks Delirium/Acute Encephalopathy -no fever past 36-48  hrs -developed afternoon of 10/23/15--likely related to development of Afib with RVR -cultures neg -d/c zosyn and observe -previous episode at time of admission likely related to Afib with RVR as well--admit EKG showed Afib with RVR with HR 130s -supplementing B12--284 -TSH 3.128 -UA if pt able to produce some urine Chronic Atrial fibrillation/Atrial fibrillation with RVR -went back in to RVR afternoon of 10/23/15 -transfer to stepdown and start diltiazem drip -CHADS-VASc = 8 -continue coumadin -Echo -am BMP, CBC Chronic systolic and diastolic CHF -Appears clinically Compensated -07/22/2015 echocardiogram EF 40-45%, PAP 54 -Continue carvedilol ESRD -Appreciate Nephrology consult for maintenance dialysis -Dialysis was initiated during his admission on 09/03/2015 -Fistula was created, but not yet mature Diabetes mellitus type 2 -Hemoglobin A1c--5.9 -Patient normally takes glipizide at home -NovoLog sliding scale Hypertension -Controlled -Continue carvedilol Hypothyroidism -Continue Synthroid Hyperlipidemia -Continue statin L-Knee Pain -??fall onto knee -uric acid--5.9 -now resolved Hypokalemia -replete Anemia of CKD -B12 marginally low -supplement  Family Communication: Pt at beside Disposition Plan: Transfer to stepdown    Procedures/Studies: Dg Chest 2 View  10/20/2015  CLINICAL DATA:  Acute onset of altered mental status. Tachycardia. Initial encounter. EXAM: CHEST  2 VIEW COMPARISON:  Chest radiograph performed 09/04/2015 FINDINGS: The lungs are well-aerated. Vascular congestion is noted. A small left pleural effusion is suspected. Increased interstitial markings likely reflect pulmonary edema. There is no evidence of pneumothorax. The heart is enlarged. No acute osseous abnormalities are seen. A right-sided dual-lumen catheter is noted ending about the cavoatrial junction. IMPRESSION: Vascular congestion and cardiomegaly. Small left pleural effusion suspected.  Increased interstitial markings likely reflect pulmonary edema. Electronically Signed   By: [name] M.D.   On: 10/20/2015  22:00   Ct Head Wo Contrast  10/20/2015  CLINICAL DATA:  Altered mental status EXAM: CT HEAD WITHOUT CONTRAST TECHNIQUE: Contiguous axial images were obtained from the base of the skull through the vertex without intravenous contrast. COMPARISON:  Head CT July 19, 2015; brain MRI July 20, 2015 FINDINGS: There is stable age related volume loss. There is no intracranial mass, hemorrhage, extra-axial fluid collection, or midline shift. There is stable slight small vessel disease in the centra semiovale bilaterally. Elsewhere gray-white compartments are normal. No acute infarct is evident. Calcification along the falx is stable. Bony calvarium appears intact. The mastoid air cells are clear. No intraorbital lesions are identified. IMPRESSION: Slight periventricular small vessel disease, stable. No intracranial mass, hemorrhage, or new gray - white compartment lesions/acute appearing infarct. Stable appearance compared to prior studies. Electronically Signed   By: Bretta BangWilliam  Woodruff III M.D.   On: 10/20/2015 21:26           Objective: Filed Vitals:   10/23/15 1215 10/23/15 1245 10/23/15 1315 10/23/15 1349  BP: 126/79 120/74 119/68 126/79  Pulse: 91 80 80 88  Temp:    97.1 F (36.2 C)  TempSrc:    Oral  Resp:    22  Height:      Weight:    73.6 kg (162 lb 4.1 oz)  SpO2:    99%    Intake/Output Summary (Last 24 hours) at 10/23/15 1701 Last data filed at 10/23/15 1624  Gross per 24 hour  Intake    702 ml  Output    900 ml  Net   -198 ml   Weight change: -1.225 kg (-2 lb 11.2 oz) Exam:   General:  Pt is alert, follows commands appropriately, not in acute distress  HEENT: No icterus, No thrush, No neck mass, [place]/AT  Cardiovascular: IRRR, S1/S2, no rubs, no gallops  Respiratory: CTA bilaterally, no wheezing, no crackles, no rhonchi  Abdomen: Soft/+BS,  non tender, non distended, no guarding; no HSM  Extremities: No edema, No lymphangitis, No petechiae, No rashes, no synovitis  Data Reviewed: Basic Metabolic Panel:  Recent Labs Lab 10/20/15 2004 10/21/15 0432 10/22/15 0403 10/23/15 0500  NA 136 138 139 140  K 3.1* 2.5* 3.1* 3.6  CL 94* 98* 98* 99*  CO2 28 28 27 26   GLUCOSE 148* 74 111* 119*  BUN 25* 31* 44* 60*  CREATININE 3.38* 3.99* 5.86* 7.40*  CALCIUM 9.0 8.5* 8.8* 8.9   Liver Function Tests:  Recent Labs Lab 10/20/15 2004  AST 45*  ALT 24  ALKPHOS 91  BILITOT 0.8  PROT 7.8  ALBUMIN 2.6*   No results for input(s): LIPASE, AMYLASE in the last 168 hours. No results for input(s): AMMONIA in the last 168 hours. CBC:  Recent Labs Lab 10/20/15 2004 10/21/15 0432 10/22/15 0403  WBC 5.7 4.3 4.8  NEUTROABS 3.9  --   --   HGB 12.4* 10.4* 10.5*  HCT 39.9 34.8* 35.1*  MCV 97.8 98.9 97.5  PLT 175 132* 145*   Cardiac Enzymes: No results for input(s): CKTOTAL, CKMB, CKMBINDEX, TROPONINI in the last 168 hours. BNP: Invalid input(s): POCBNP CBG:  Recent Labs Lab 10/21/15 0106  GLUCAP 94    Recent Results (from the past 240 hour(s))  Urine culture     Status: None   Collection Time: 10/20/15  9:15 PM  Result Value Ref Range Status   Specimen Description URINE, RANDOM  Final   Special Requests NONE  Final   Culture NO GROWTH  2 DAYS  Final   Report Status 10/22/2015 FINAL  Final  Culture, blood (routine x 2)     Status: None (Preliminary result)   Collection Time: 10/20/15 10:00 PM  Result Value Ref Range Status   Specimen Description BLOOD RIGHT ARM  Final   Special Requests BOTTLES DRAWN AEROBIC AND ANAEROBIC  Final   Culture NO GROWTH 3 DAYS  Final   Report Status PENDING  Incomplete  Culture, blood (routine x 2)     Status: None (Preliminary result)   Collection Time: 10/20/15 10:24 PM  Result Value Ref Range Status   Specimen Description BLOOD RIGHT HAND  Final   Special Requests BOTTLES  DRAWN AEROBIC AND ANAEROBIC  Final   Culture NO GROWTH 3 DAYS  Final   Report Status PENDING  Incomplete  MRSA PCR Screening     Status: None   Collection Time: 10/21/15  1:41 AM  Result Value Ref Range Status   MRSA by PCR NEGATIVE NEGATIVE Final    Comment:        The GeneXpert MRSA Assay (FDA approved for NASAL specimens only), is one component of a comprehensive MRSA colonization surveillance program. It is not intended to diagnose MRSA infection nor to guide or monitor treatment for MRSA infections.      Scheduled Meds: . calcium acetate  667 mg Oral TID WC  . carvedilol  12.5 mg Oral BID WC  . diltiazem  10 mg Intravenous Once  . levothyroxine  175 mcg Oral QAC breakfast  . piperacillin-tazobactam (ZOSYN)  IV  2.25 g Intravenous Q8H  . rosuvastatin  20 mg Oral Daily  . sodium chloride  3 mL Intravenous Q12H  . [START ON 10/25/2015] vancomycin  750 mg Intravenous Q M,W,F-HD  . vitamin B-12  500 mcg Oral Daily  . Warfarin - Pharmacist Dosing Inpatient   Does not apply q1800   Continuous Infusions: . diltiazem (CARDIZEM) infusion       Elise Knobloch, DO  [facility] Pager [phone]  If 7PM-7AM, please contact night-coverage [email or web address] Password [email or web address] 10/23/2015, 5:01 PM   LOS: 3 days

## 2015-10-23 NOTE — Progress Notes (Signed)
Report called to Clarissa.  All questions answered.

## 2015-10-23 NOTE — Progress Notes (Signed)
Called family to let them know patient has been moved to 2C11.  Spoke with patient's wife Jasmine DecemberSharon & daughter April.

## 2015-10-23 NOTE — Procedures (Signed)
Pt seen on HD.  Low grade temp last night.  Ap 150 Vp 150 BFR 400.  He is below DW so will pull 750cc.  If BC remain neg then I think he can be DC but would cont 2 week course of vanco and plan to stick AVF when 2 months old.  K 3.8 switched to 4K bath.

## 2015-10-24 ENCOUNTER — Inpatient Hospital Stay (HOSPITAL_COMMUNITY): Payer: Medicare Other

## 2015-10-24 DIAGNOSIS — J9601 Acute respiratory failure with hypoxia: Secondary | ICD-10-CM

## 2015-10-24 DIAGNOSIS — I4891 Unspecified atrial fibrillation: Secondary | ICD-10-CM

## 2015-10-24 DIAGNOSIS — R4182 Altered mental status, unspecified: Secondary | ICD-10-CM

## 2015-10-24 LAB — PROTIME-INR
INR: 2.97 — AB (ref 0.00–1.49)
Prothrombin Time: 30.4 seconds — ABNORMAL HIGH (ref 11.6–15.2)

## 2015-10-24 LAB — CBC
HCT: 39.8 % (ref 39.0–52.0)
Hemoglobin: 12.2 g/dL — ABNORMAL LOW (ref 13.0–17.0)
MCH: 30 pg (ref 26.0–34.0)
MCHC: 30.7 g/dL (ref 30.0–36.0)
MCV: 98 fL (ref 78.0–100.0)
PLATELETS: 144 10*3/uL — AB (ref 150–400)
RBC: 4.06 MIL/uL — AB (ref 4.22–5.81)
RDW: 17.4 % — AB (ref 11.5–15.5)
WBC: 4.4 10*3/uL (ref 4.0–10.5)

## 2015-10-24 LAB — BASIC METABOLIC PANEL
ANION GAP: 13 (ref 5–15)
BUN: 27 mg/dL — ABNORMAL HIGH (ref 6–20)
CALCIUM: 8.8 mg/dL — AB (ref 8.9–10.3)
CHLORIDE: 100 mmol/L — AB (ref 101–111)
CO2: 24 mmol/L (ref 22–32)
CREATININE: 4.62 mg/dL — AB (ref 0.61–1.24)
GFR calc Af Amer: 13 mL/min — ABNORMAL LOW (ref >60)
GFR calc non Af Amer: 11 mL/min — ABNORMAL LOW (ref >60)
Glucose, Bld: 130 mg/dL — ABNORMAL HIGH (ref 65–99)
POTASSIUM: 4.6 mmol/L (ref 3.5–5.1)
Sodium: 137 mmol/L (ref 135–145)

## 2015-10-24 LAB — GLUCOSE, CAPILLARY: Glucose-Capillary: 165 mg/dL — ABNORMAL HIGH (ref 65–99)

## 2015-10-24 LAB — SEDIMENTATION RATE: SED RATE: 95 mm/h — AB (ref 0–16)

## 2015-10-24 MED ORDER — DEXTROSE 5 % IV SOLN
2.0000 g | INTRAVENOUS | Status: DC
  Administered 2015-10-25: 2 g via INTRAVENOUS
  Filled 2015-10-24 (×2): qty 2

## 2015-10-24 MED ORDER — DILTIAZEM HCL 30 MG PO TABS
30.0000 mg | ORAL_TABLET | Freq: Four times a day (QID) | ORAL | Status: DC
  Administered 2015-10-24 – 2015-10-25 (×5): 30 mg via ORAL
  Filled 2015-10-24 (×5): qty 1

## 2015-10-24 MED ORDER — WARFARIN SODIUM 5 MG PO TABS
2.5000 mg | ORAL_TABLET | Freq: Once | ORAL | Status: AC
  Administered 2015-10-24: 2.5 mg via ORAL
  Filled 2015-10-24: qty 1

## 2015-10-24 MED ORDER — DEXTROSE 5 % IV SOLN
2.0000 g | Freq: Once | INTRAVENOUS | Status: AC
  Administered 2015-10-24: 2 g via INTRAVENOUS
  Filled 2015-10-24: qty 2

## 2015-10-24 MED ORDER — DEXTROSE 5 % IV SOLN: 1.0000 g | Freq: Once | INTRAVENOUS | Status: DC

## 2015-10-24 NOTE — Progress Notes (Signed)
ANTICOAGULATION CONSULT NOTE - Follow Up Consult  Pharmacy Consult for Coumadin  Indication: atrial fibrillation  Allergies  Allergen Reactions  . Tomato Other (See Comments)    Too much acid - cannot take due to kidney issues   Patient Measurements: Height: 5\' 9"  (175.3 cm) Weight: 162 lb 0.6 oz (73.5 kg) IBW/kg (Calculated) : 70.7  Vital Signs: Temp: 99 F (37.2 C) (01/08 0300) Temp Source: Oral (01/08 0300) BP: 111/77 mmHg (01/08 0800) Pulse Rate: 74 (01/08 0800)  Labs:  Recent Labs  10/22/15 0403 10/23/15 0500 10/24/15 0411  HGB 10.5*  --  12.2*  HCT 35.1*  --  39.8  PLT 145*  --  144*  LABPROT 34.6* 35.8* 30.4*  INR 3.53* 3.69* 2.97*  CREATININE 5.86* 7.40* 4.62*   Estimated Creatinine Clearance: 13.4 mL/min (by C-G formula based on Cr of 4.62).  Assessment: 80yo male with hx. of AFib on Warfarin prior to admit.  His INR on admit was 2.83 but bumped to 3.69 and Warfarin was held.  We have been assisting the dosing of this and today his INR is within goal range at 2.97.  His CBC is stable with H/H =  12./39.8.  His platelets are 144K and no noted bleeding documented.  PTA dose: 5 mg MWF, 7.5 mg TTSS  Goal of Therapy:  INR 2-3 Monitor platelets by anticoagulation protocol: Yes   Plan:  - Give only 2.5mg  Warfarin tonight and hopefully put him on maintenance dose in next 48-72hr.  - Daily PT/INR - Monitor for s/s of bleeding   [name] [name], PharmD., MS Clinical Pharmacist Pager:  [phone] Thank you for allowing pharmacy to be part of this patients care team. 10/24/2015 8:58 AM

## 2015-10-24 NOTE — Progress Notes (Signed)
S: Feels well  No F/C/S O:BP 106/60 mmHg  Pulse 80  Temp(Src) 99 F (37.2 C) (Oral)  Resp 30  Ht 5\' 9"  (1.753 m)  Wt 73.5 kg (162 lb 0.6 oz)  BMI 23.92 kg/m2  SpO2 94%  Intake/Output Summary (Last 24 hours) at 10/24/15 0802 Last data filed at 10/24/15 0600  Gross per 24 hour  Intake 857.66 ml  Output    850 ml  Net   7.66 ml   Weight change: -2.726 kg (-6 lb 0.2 oz) RUE:AVWUJGen:awake and alert WJX:BJYNWCVS:irreg, irreg Resp: Clear Abd: + BS NTND Ext: No Edema.  LUA AVF (placed 09/10/15) NEURO:CNi Ox3 no asterixis   . antiseptic oral rinse  7 mL Mouth Rinse BID  . calcium acetate  667 mg Oral TID WC  . carvedilol  12.5 mg Oral BID WC  . levothyroxine  175 mcg Oral QAC breakfast  . rosuvastatin  20 mg Oral Daily  . sodium chloride  3 mL Intravenous Q12H  . [START ON 10/25/2015] vancomycin  750 mg Intravenous Q M,W,F-HD  . vitamin B-12  500 mcg Oral Daily  . Warfarin - Pharmacist Dosing Inpatient   Does not apply q1800   No results found. BMET    Component Value Date/Time   NA 137 10/24/2015 0411   NA 141 03/01/2015   K 4.6 10/24/2015 0411   CL 100* 10/24/2015 0411   CO2 24 10/24/2015 0411   GLUCOSE 130* 10/24/2015 0411   BUN 27* 10/24/2015 0411   BUN 75* 03/01/2015   CREATININE 4.62* 10/24/2015 0411   CREATININE 3.6* 03/01/2015   CREATININE 3.42* 12/14/2014 1104   CALCIUM 8.8* 10/24/2015 0411   GFRNONAA 11* 10/24/2015 0411   GFRNONAA 16* 12/14/2014 1104   GFRAA 13* 10/24/2015 0411   GFRAA 19* 12/14/2014 1104   CBC    Component Value Date/Time   WBC 4.4 10/24/2015 0411   RBC 4.06* 10/24/2015 0411   HGB 12.2* 10/24/2015 0411   HCT 39.8 10/24/2015 0411   PLT 144* 10/24/2015 0411   MCV 98.0 10/24/2015 0411   MCH 30.0 10/24/2015 0411   MCHC 30.7 10/24/2015 0411   RDW 17.4* 10/24/2015 0411   LYMPHSABS 1.1 10/20/2015 2004   MONOABS 0.5 10/20/2015 2004   EOSABS 0.2 10/20/2015 2004   BASOSABS 0.0 10/20/2015 2004     Assessment: 1. Fever  BC neg to date.  AVF placed  09/10/15.  If BC remain neg then I would leave cath in for now but I would still treat with vanco x 2 weeks and plan to use AVF when 2 months old which is later this month. 2. ESRD  MWF [facility] 3. Anemia  Not on ESA 4. DM 5. A fib  Plan: 1.  HD tomorrow to get back on schedule.  ? DC tomorrow if HR stays controlled   Makani Seckman T

## 2015-10-24 NOTE — Progress Notes (Signed)
PROGRESS NOTE  [name] [name] WUJ:[id number] DOB: 12/29/1935 DOA: 10/20/2015 PCP: Tana ConchStephen Hunter, MD Brief History 80 year old male with a history of ESRD (MWF), diabetes mellitus, hypertension, diastolic CHF, procedure fibrillation, hypothyroidism presented with generalized weakness. Apparently, the patient has been feeling weak for the past 2-3 days prior to admission. After dialysis on 10/20/2015, the patient had much difficulty getting out of his car secondary to generalized weakness. Upon arrival to the emergency department, the patient was confused and cannot recall the events of the day. It took him 20 minutes to get out of the car, then 20 minutes to get into the house, and he just seemed like he couldn't walk, urinated on himself, had the shakes, and was incoherent. He was noted to be tachycardic with a temperature 100.45F and tachypnea. He states that aside from his feelings of weakness prior to admission, he was in his usual state of health. He denied any fevers, chills, chest pain, shortness breath, coughing, hemoptysis, nausea, vomiting, diarrhea, abdominal pain, dysuria, headache, neck pain, rashes. On the afternoon of 10/23/15, the patient was noted to be confused and also had Afib with RVR concurrently. He was moved to stepdown and placed on diltiazem drip.  Interim Events Patient's mental status returned back to baseline. Patient complains of nonproductive cough but denies any fevers, chills, chest pain, ferrous breath, nausea, vomiting, diarrhea, abdominal pain, headache, neck pain. Assessment/Plan: SIRS -with fever, tachycardia, tachypnea -Continue empiric vancomycin  -no fever past 72 hours, hemodynamically stable -d/c zosyn -renal recommends continuing vanco x 2 weeks Delirium/Acute Encephalopathy -developed afternoon of 10/23/15--likely related to development of Afib with RVR -cultures neg --10/24/15--back to baseline -d/c zosyn and observe -previous episode at time  of admission likely related to Afib with RVR as well--admit EKG showed Afib with RVR with HR 130s -supplementing B12--284 -TSH 3.128 -UA if pt able to produce some urine Chronic Atrial fibrillation/Atrial fibrillation with RVR -went back in to RVR afternoon of 10/23/15 -transfer to stepdown and start diltiazem drip -CHADS-VASc = 8 -continue coumadin -Echo -INR 2.97 -d/c diltiazem drip, start po cardizem Acute respiratory failure with hypoxia -Chest x-ray -Presently stable on 1 L nasal cannula with saturation 92-94% -Incentive spirometry Chronic systolic CHF -Appears clinically Compensated -07/22/2015 echocardiogram EF 40-45%, PAP 54 -Continue carvedilol ESRD -Appreciate Nephrology consult for maintenance dialysis -Dialysis was initiated during his admission on 09/03/2015 -Fistula was created, but not yet mature Diabetes mellitus type 2 -Hemoglobin A1c--5.9 -Patient normally takes glipizide at home -NovoLog sliding scale Hypertension -Controlled -Continue carvedilol Hypothyroidism -Continue Synthroid Hyperlipidemia -Continue statin L-Knee Pain -??fall onto knee -uric acid--5.9 -now resolved Hypokalemia -replete Anemia of CKD -B12 marginally low -supplement  Family Communication: Pt at beside Disposition Plan: Transfer to floor     Procedures/Studies: Dg Chest 2 View  10/20/2015  CLINICAL DATA:  Acute onset of altered mental status. Tachycardia. Initial encounter. EXAM: CHEST  2 VIEW COMPARISON:  Chest radiograph performed 09/04/2015 FINDINGS: The lungs are well-aerated. Vascular congestion is noted. A small left pleural effusion is suspected. Increased interstitial markings likely reflect pulmonary edema. There is no evidence of pneumothorax. The heart is enlarged. No acute osseous abnormalities are seen. A right-sided dual-lumen catheter is noted ending about the cavoatrial junction. IMPRESSION: Vascular congestion and cardiomegaly. Small left pleural effusion  suspected. Increased interstitial markings likely reflect pulmonary edema. Electronically Signed   By: Roanna RaiderJeffery  Chang M.D.   On: 10/20/2015 22:00   Ct Head Wo Contrast  10/20/2015  CLINICAL DATA:  Altered mental status EXAM: CT HEAD WITHOUT CONTRAST TECHNIQUE: Contiguous axial images were obtained from the base of the skull through the vertex without intravenous contrast. COMPARISON:  Head CT July 19, 2015; brain MRI July 20, 2015 FINDINGS: There is stable age related volume loss. There is no intracranial mass, hemorrhage, extra-axial fluid collection, or midline shift. There is stable slight small vessel disease in the centra semiovale bilaterally. Elsewhere gray-white compartments are normal. No acute infarct is evident. Calcification along the falx is stable. Bony calvarium appears intact. The mastoid air cells are clear. No intraorbital lesions are identified. IMPRESSION: Slight periventricular small vessel disease, stable. No intracranial mass, hemorrhage, or new gray - white compartment lesions/acute appearing infarct. Stable appearance compared to prior studies. Electronically Signed   By: Bretta Bang III M.D.   On: 10/20/2015 21:26           Objective: Filed Vitals:   10/24/15 0645 10/24/15 0700 10/24/15 0753 10/24/15 0800  BP: 115/70 122/67 106/60 111/77  Pulse: 69 72 80 74  Temp:      TempSrc:      Resp: 30 30  24   Height:      Weight:      SpO2: 96% 94%  98%    Intake/Output Summary (Last 24 hours) at 10/24/15 0826 Last data filed at 10/24/15 0800  Gross per 24 hour  Intake 867.66 ml  Output    850 ml  Net  17.66 ml   Weight change: -2.726 kg (-6 lb 0.2 oz) Exam:   General:  Pt is alert, follows commands appropriately, not in acute distress  HEENT: No icterus, No thrush, No neck mass, [place]/AT  Cardiovascular: IRRR, S1/S2, no rubs, no gallops  Respiratory: Bibasilar crackles, right greater than left. No wheezing. Good air movement  Abdomen: Soft/+BS, non  tender, non distended, no guarding; no hepatosplenomegaly  Extremities: No edema, No lymphangitis, No petechiae, No rashes, no synovitis; no cyanosis or clubbing  Data Reviewed: Basic Metabolic Panel:  Recent Labs Lab 10/20/15 2004 10/21/15 0432 10/22/15 0403 10/23/15 0500 10/24/15 0411  NA 136 138 139 140 137  K 3.1* 2.5* 3.1* 3.6 4.6  CL 94* 98* 98* 99* 100*  CO2 28 28 27 26 24   GLUCOSE 148* 74 111* 119* 130*  BUN 25* 31* 44* 60* 27*  CREATININE 3.38* 3.99* 5.86* 7.40* 4.62*  CALCIUM 9.0 8.5* 8.8* 8.9 8.8*   Liver Function Tests:  Recent Labs Lab 10/20/15 2004  AST 45*  ALT 24  ALKPHOS 91  BILITOT 0.8  PROT 7.8  ALBUMIN 2.6*   No results for input(s): LIPASE, AMYLASE in the last 168 hours. No results for input(s): AMMONIA in the last 168 hours. CBC:  Recent Labs Lab 10/20/15 2004 10/21/15 0432 10/22/15 0403 10/24/15 0411  WBC 5.7 4.3 4.8 4.4  NEUTROABS 3.9  --   --   --   HGB 12.4* 10.4* 10.5* 12.2*  HCT 39.9 34.8* 35.1* 39.8  MCV 97.8 98.9 97.5 98.0  PLT 175 132* 145* 144*   Cardiac Enzymes: No results for input(s): CKTOTAL, CKMB, CKMBINDEX, TROPONINI in the last 168 hours. BNP: Invalid input(s): POCBNP CBG:  Recent Labs Lab 10/21/15 0106  GLUCAP 94    Recent Results (from the past 240 hour(s))  Urine culture     Status: None   Collection Time: 10/20/15  9:15 PM  Result Value Ref Range Status   Specimen Description URINE, RANDOM  Final   Special Requests NONE  Final  Culture NO GROWTH 2 DAYS  Final   Report Status 10/22/2015 FINAL  Final  Culture, blood (routine x 2)     Status: None (Preliminary result)   Collection Time: 10/20/15 10:00 PM  Result Value Ref Range Status   Specimen Description BLOOD RIGHT ARM  Final   Special Requests BOTTLES DRAWN AEROBIC AND ANAEROBIC  Final   Culture NO GROWTH 3 DAYS  Final   Report Status PENDING  Incomplete  Culture, blood (routine x 2)     Status: None (Preliminary result)   Collection  Time: 10/20/15 10:24 PM  Result Value Ref Range Status   Specimen Description BLOOD RIGHT HAND  Final   Special Requests BOTTLES DRAWN AEROBIC AND ANAEROBIC  Final   Culture NO GROWTH 3 DAYS  Final   Report Status PENDING  Incomplete  MRSA PCR Screening     Status: None   Collection Time: 10/21/15  1:41 AM  Result Value Ref Range Status   MRSA by PCR NEGATIVE NEGATIVE Final    Comment:        The GeneXpert MRSA Assay (FDA approved for NASAL specimens only), is one component of a comprehensive MRSA colonization surveillance program. It is not intended to diagnose MRSA infection nor to guide or monitor treatment for MRSA infections.   MRSA PCR Screening     Status: None   Collection Time: 10/23/15  7:06 PM  Result Value Ref Range Status   MRSA by PCR NEGATIVE NEGATIVE Final    Comment:        The GeneXpert MRSA Assay (FDA approved for NASAL specimens only), is one component of a comprehensive MRSA colonization surveillance program. It is not intended to diagnose MRSA infection nor to guide or monitor treatment for MRSA infections.      Scheduled Meds: . antiseptic oral rinse  7 mL Mouth Rinse BID  . calcium acetate  667 mg Oral TID WC  . carvedilol  12.5 mg Oral BID WC  . diltiazem  30 mg Oral 4 times per day  . levothyroxine  175 mcg Oral QAC breakfast  . rosuvastatin  20 mg Oral Daily  . sodium chloride  3 mL Intravenous Q12H  . [START ON 10/25/2015] vancomycin  750 mg Intravenous Q M,W,F-HD  . vitamin B-12  500 mcg Oral Daily  . Warfarin - Pharmacist Dosing Inpatient   Does not apply q1800   Continuous Infusions:    Cassondra Stachowski, DO  [facility] Pager [phone]  If 7PM-7AM, please contact night-coverage [email or web address] Password [email or web address] 10/24/2015, 8:26 AM   LOS: 4 days

## 2015-10-24 NOTE — Progress Notes (Signed)
Patient was transferred to 6E 15 per hospital bed.  Family is aware of this transfer.

## 2015-10-24 NOTE — Progress Notes (Signed)
*  PRELIMINARY RESULTS* Echocardiogram 2D Echocardiogram has been performed.  Jeryl Columbialliott, Brexton Sofia 10/24/2015, 12:26 PM

## 2015-10-25 DIAGNOSIS — J189 Pneumonia, unspecified organism: Secondary | ICD-10-CM

## 2015-10-25 DIAGNOSIS — E1129 Type 2 diabetes mellitus with other diabetic kidney complication: Secondary | ICD-10-CM

## 2015-10-25 DIAGNOSIS — N058 Unspecified nephritic syndrome with other morphologic changes: Secondary | ICD-10-CM

## 2015-10-25 DIAGNOSIS — J9600 Acute respiratory failure, unspecified whether with hypoxia or hypercapnia: Secondary | ICD-10-CM

## 2015-10-25 LAB — CULTURE, BLOOD (ROUTINE X 2)
CULTURE: NO GROWTH
Culture: NO GROWTH

## 2015-10-25 LAB — RENAL FUNCTION PANEL
ALBUMIN: 2.2 g/dL — AB (ref 3.5–5.0)
Anion gap: 14 (ref 5–15)
BUN: 43 mg/dL — AB (ref 6–20)
CO2: 25 mmol/L (ref 22–32)
CREATININE: 6.68 mg/dL — AB (ref 0.61–1.24)
Calcium: 8.9 mg/dL (ref 8.9–10.3)
Chloride: 99 mmol/L — ABNORMAL LOW (ref 101–111)
GFR calc Af Amer: 8 mL/min — ABNORMAL LOW (ref >60)
GFR, EST NON AFRICAN AMERICAN: 7 mL/min — AB (ref >60)
Glucose, Bld: 152 mg/dL — ABNORMAL HIGH (ref 65–99)
PHOSPHORUS: 4.1 mg/dL (ref 2.5–4.6)
Potassium: 4.6 mmol/L (ref 3.5–5.1)
Sodium: 138 mmol/L (ref 135–145)

## 2015-10-25 LAB — CBC WITH DIFFERENTIAL/PLATELET
BASOS ABS: 0 10*3/uL (ref 0.0–0.1)
BASOS PCT: 0 %
EOS ABS: 0.6 10*3/uL (ref 0.0–0.7)
EOS PCT: 13 %
HCT: 35.6 % — ABNORMAL LOW (ref 39.0–52.0)
Hemoglobin: 10.9 g/dL — ABNORMAL LOW (ref 13.0–17.0)
Lymphocytes Relative: 14 %
Lymphs Abs: 0.7 10*3/uL (ref 0.7–4.0)
MCH: 29.4 pg (ref 26.0–34.0)
MCHC: 30.6 g/dL (ref 30.0–36.0)
MCV: 96 fL (ref 78.0–100.0)
MONO ABS: 0.3 10*3/uL (ref 0.1–1.0)
Monocytes Relative: 6 %
NEUTROS ABS: 3.2 10*3/uL (ref 1.7–7.7)
Neutrophils Relative %: 67 %
PLATELETS: 121 10*3/uL — AB (ref 150–400)
RBC: 3.71 MIL/uL — ABNORMAL LOW (ref 4.22–5.81)
RDW: 17.3 % — AB (ref 11.5–15.5)
WBC: 4.8 10*3/uL (ref 4.0–10.5)

## 2015-10-25 MED ORDER — CYANOCOBALAMIN 500 MCG PO TABS: 500.0000 ug | ORAL_TABLET | Freq: Every day | ORAL | Status: DC

## 2015-10-25 MED ORDER — VANCOMYCIN HCL IN DEXTROSE 750-5 MG/150ML-% IV SOLN: 750.0000 mg | INTRAVENOUS | Status: DC

## 2015-10-25 MED ORDER — DILTIAZEM HCL ER COATED BEADS 120 MG PO CP24
120.0000 mg | ORAL_CAPSULE | Freq: Every day | ORAL | Status: DC
  Administered 2015-10-25: 120 mg via ORAL
  Filled 2015-10-25: qty 1

## 2015-10-25 MED ORDER — WARFARIN SODIUM 1 MG PO TABS
1.0000 mg | ORAL_TABLET | Freq: Once | ORAL | Status: DC
  Filled 2015-10-25: qty 1

## 2015-10-25 MED ORDER — WARFARIN SODIUM 1 MG PO TABS: ORAL_TABLET | ORAL | Status: DC

## 2015-10-25 MED ORDER — DEXTROSE 5 % IV SOLN: 2.0000 g | INTRAVENOUS | Status: DC

## 2015-10-25 MED ORDER — RENA-VITE PO TABS
1.0000 | ORAL_TABLET | Freq: Every day | ORAL | Status: DC
  Filled 2015-10-25: qty 1

## 2015-10-25 MED ORDER — DOXERCALCIFEROL 4 MCG/2ML IV SOLN: 1.0000 ug | INTRAVENOUS | Status: DC

## 2015-10-25 MED ORDER — DILTIAZEM HCL ER COATED BEADS 120 MG PO CP24: 120.0000 mg | ORAL_CAPSULE | Freq: Every day | ORAL | Status: DC

## 2015-10-25 NOTE — Care Management Note (Signed)
Case Management Note  Patient Details  Name: Ernestine ConradRoy L Winemiller MRN: [id number] Date of Birth: 06/07/1936  Subjective/Objective:          CM following for progression and d/c planning.          Action/Plan: [facility], will resume these services as HHPT recommended per PT eval.   Expected Discharge Date:       10/24/2014           Expected Discharge Plan:  Home w Home Health Services  In-House Referral:  NA  Discharge planning Services  CM Consult  Post Acute Care Choice:  NA Choice offered to:  NA  DME Arranged:  N/A DME Agency:  NA  HH Arranged:  RN, PT HH Agency:  [facility]  Status of Service:  Completed, signed off  Medicare Important Message Given:  Yes Date Medicare IM Given:    Medicare IM give by:    Date Additional Medicare IM Given:    Additional Medicare Important Message give by:     If discussed at Long Length of Stay Meetings, dates discussed:    Additional Comments:  Starlyn SkeansRoyal, Demeshia Sherburne U, RN 10/25/2015, 3:07 PM

## 2015-10-25 NOTE — Progress Notes (Signed)
ANTICOAGULATION CONSULT NOTE - Follow Up Consult  Pharmacy Consult for Coumadin  Indication: atrial fibrillation  Allergies  Allergen Reactions  . Tomato Other (See Comments)    Too much acid - cannot take due to kidney issues   Patient Measurements: Height: 5\' 9"  (175.3 cm) Weight: 158 lb 4.6 oz (71.8 kg) IBW/kg (Calculated) : 70.7  Vital Signs: Temp: 97.6 F (36.4 C) (01/09 1151) Temp Source: Oral (01/09 1151) BP: 115/82 mmHg (01/09 1151) Pulse Rate: 119 (01/09 1151)  Labs:  Recent Labs  10/23/15 0500 10/24/15 0411 10/25/15 0753  HGB  --  12.2* 10.9*  HCT  --  39.8 35.6*  PLT  --  144* 121*  LABPROT 35.8* 30.4*  --   INR 3.69* 2.97*  --   CREATININE 7.40* 4.62* 6.68*   Estimated Creatinine Clearance: 9.3 mL/min (by C-G formula based on Cr of 6.68).  Assessment: 80yo male with hx. of AFib on Warfarin prior to admit.  His INR on admit was 2.83 but bumped to 3.69 and Warfarin was held.  We have been assisting the dosing of this   PTA dose: 5 mg MWF, 7.5 mg TTSS  Goal of Therapy:  INR 2-3 Monitor platelets by anticoagulation protocol: Yes   Plan:  - Give only 1 mg Warfarin tonight and hopefully put him on maintenance dose in next 48-72hr.  - Daily PT/INR - Monitor for s/s of bleeding   Thank you Okey RegalLisa Jasmond River, PharmD [phone]  10/25/2015 1:42 PM

## 2015-10-25 NOTE — Progress Notes (Signed)
[facility] Progress Note  Assessment/Plan: 1. Fever/AMS - BC pending -empiric Vanc and Elita QuickFortaz, ? RLL PNA per admission CXR, sats ok on room air while on HD;  2. ESRD - MWF K 4.6 3. Anemia - Hgb stable trending down - not on ESA yet- on a venofer load which has been held due to acute infx 4. Secondary hyperparathyroidism - resume Hectorol 1 Ca/P ok 5. HTN/volume - dilt 30 qid /coreg 12.5 bid, mild CHF on admission CXR 6. Nutrition - alb 2.2 renal carb mod/add vits 7. Afib/RVR dilt and coumadin per pharm  Addendum:  Vanc and Fortaz to be given at outpt HD unit as per d/c summary instructions;  We will also follow up to make sure he has adequate monitoring of INRs.  EDW lowered to 72.5- per hospital weights. Reassess at outpt HD unit.  Sheffield SliderMartha B Bergman, PA-C [facility] Beeper [phone] 10/25/2015,10:10 AM  LOS: 5 days   Pt seen, examined and agree w A/P as above.  Vinson Moselleob Dwan Hemmelgarn MD [facility] pager [phone]    cell [phone] 10/25/2015, 3:52 PM    Subjective:   Hasn't eaten much. No cough,   Objective Filed Vitals:   10/25/15 0800 10/25/15 0830 10/25/15 0858 10/25/15 0925  BP: 105/67 116/66 120/68 105/67  Pulse: 77 82 81 97  Temp:      TempSrc:      Resp:      Height:      Weight:      SpO2:       Physical Exam General: NAD, alert Heart: irreg irreg Lungs: grossly clear but supine on HD so exam somewhat limited Abdomen: soft NT Extremities:no LE edema Dialysis Access: right IJ and left upper AVF + bruit  Dialysis Orders: [facility] on MWF . EDW 75.5 kg HD Bath 2.0k, 2.25ca Time 4hrs Heparin 1500 . Access LUA AVF / RIJ perm cath  Hectorol 1mcg IV/HD / no esa Venofer load100 mg q hd last dose to be 11/01/15   Other= op lab 11.3 hgb 10/20/15 tfs 13%/ ca 9.9 phos 3.9 pth 297   Additional Objective Labs: Basic Metabolic Panel:  Recent Labs Lab 10/23/15 0500 10/24/15 0411 10/25/15 0753  NA 140 137 138  K 3.6 4.6  4.6  CL 99* 100* 99*  CO2 26 24 25   GLUCOSE 119* 130* 152*  BUN 60* 27* 43*  CREATININE 7.40* 4.62* 6.68*  CALCIUM 8.9 8.8* 8.9  PHOS  --   --  4.1   Liver Function Tests:  Recent Labs Lab 10/20/15 2004 10/25/15 0753  AST 45*  --   ALT 24  --   ALKPHOS 91  --   BILITOT 0.8  --   PROT 7.8  --   ALBUMIN 2.6* 2.2*   Lab Results  Component Value Date   INR 2.97* 10/24/2015   INR 3.69* 10/23/2015   INR 3.53* 10/22/2015    CBC:  Recent Labs Lab 10/20/15 2004 10/21/15 0432 10/22/15 0403 10/24/15 0411 10/25/15 0753  WBC 5.7 4.3 4.8 4.4 4.8  NEUTROABS 3.9  --   --   --  3.2  HGB 12.4* 10.4* 10.5* 12.2* 10.9*  HCT 39.9 34.8* 35.1* 39.8 35.6*  MCV 97.8 98.9 97.5 98.0 96.0  PLT 175 132* 145* 144* 121*   Blood Culture    Component Value Date/Time   SDES BLOOD RIGHT HAND 10/20/2015 2224   SPECREQUEST BOTTLES DRAWN AEROBIC AND ANAEROBIC 4ML 10/20/2015 2224   CULT NO GROWTH 4 DAYS  10/20/2015 2224   REPTSTATUS PENDING 10/20/2015 2224   CBG:  Recent Labs Lab 10/21/15 0106 10/24/15 1726  GLUCAP 94 165*   Studies/Results: Dg Chest 2 View  10/24/2015  CLINICAL DATA:  Nonproductive cough, hypoxemia. EXAM: CHEST  2 VIEW COMPARISON:  Chest x-rays dated 10/20/2015 and 09/04/2015. FINDINGS: Fairly severe cardiomegaly is unchanged. Right chest wall dual lumen catheter is stable in position with tip projected over the right atrium. Central pulmonary vascular congestion and mild bilateral interstitial edema is unchanged, suspected to be chronic. New confluent airspace opacity is seen at the right lung base. IMPRESSION: 1. New confluent airspace opacity at the right lung base, pneumonia versus aspiration. 2. Stable cardiomegaly. The mild central pulmonary vascular congestion and bilateral interstitial edema is not significantly changed, most likely related to a chronic mild CHF. Electronically Signed   By: Bary Richard M.D.   On: 10/24/2015 11:10   Medications:   . antiseptic  oral rinse  7 mL Mouth Rinse BID  . calcium acetate  667 mg Oral TID WC  . carvedilol  12.5 mg Oral BID WC  . cefTAZidime (FORTAZ)  IV  2 g Intravenous Q M,W,F-1800  . diltiazem  30 mg Oral 4 times per day  . doxercalciferol  1 mcg Intravenous Q M,W,F-HD  . levothyroxine  175 mcg Oral QAC breakfast  . multivitamin  1 tablet Oral QHS  . rosuvastatin  20 mg Oral Daily  . sodium chloride  3 mL Intravenous Q12H  . vancomycin  750 mg Intravenous Q M,W,F-HD  . vitamin B-12  500 mcg Oral Daily  . Warfarin - Pharmacist Dosing Inpatient   Does not apply [phone]

## 2015-10-25 NOTE — Discharge Summary (Signed)
Physician Discharge Summary  [name] [name]:[id number] DOB: [birth year] DOA: 10/20/2015  PCP: [name], MD  Admit date: 10/20/2015 Discharge date: 10/25/2015  Recommendations for Outpatient Follow-up:  1. Pt will need to follow up with PCP in 2 weeks post discharge 2. Please obtain BMP and CBC in 1-2 weeks 3. Please give vancomycin 750 mg on dialysis (3 more doses) on 10/27/2015, 10/29/2015, and 11/01/2015.   4. Please give ceftazidime 2 g on dialysis (2 more doses) on 10/27/2015 and 10/29/2015 5. Take Coumadin 1 mg daily--please check INR on 10/27/2015 and adjust Coumadin accordingly for INR 2-3 Discharge Diagnoses:  Sepsis -present at time of admission -secondary to pneumonia -with fever, tachycardia, tachypnea -Continue empiric vancomycin  -no fever past 72 hours, hemodynamically stable -d/c zosyn -renal recommends continuing vanco x 2 weeks-->3 more doses to be given on dialysis 10/27/2015, 10/29/2015, 11/01/2015 Delirium/Acute Encephalopathy -developed afternoon of 10/23/15--likely related to development of Afib with RVR -HCAP also contributing -cultures neg --10/24/15--back to baseline -d/c zosyn and observe -previous episode at time of admission may have been related to Afib with RVR as well--admit EKG showed Afib with RVR with HR 130s -supplementing B12--284 -TSH 3.128 -UA if pt able to produce some urine--did not produce any urine HCAP -10/24/15 CXR--new RLL infiltrate -Finish 1 week of therapy -2 more doses of ceftazidime--to be given 10/27/2015 and 10/29/2015 Chronic Atrial fibrillation/Atrial fibrillation with RVR -went back in to RVR afternoon of 10/23/15 -transfer to stepdown and start diltiazem drip -CHADS-VASc = 8 -continue coumadin -INR 2.97 -d/c diltiazem drip, start po cardizem-->home with cardizem CD 120 mg daily -10/24/15--Echo EF 40-45%, severe TR Acute respiratory failure with hypoxia -Chest x-ray--new RLL infiltrate -Presently stable on 1 L nasal  cannula with saturation 92-94%-->weaned to RA -Incentive spirometry Chronic systolic CHF -Appears clinically Compensated -07/22/2015 echocardiogram EF 40-45%, PAP 54 -Continue carvedilol ESRD -Appreciate Nephrology consult for maintenance dialysis -Dialysis was initiated during his admission on 09/03/2015 -Fistula was created, but not yet mature Diabetes mellitus type 2 -Hemoglobin A1c--5.9 -Patient normally takes glipizide at home -NovoLog sliding scale Hypertension -Controlled -Continue carvedilol Hypothyroidism -Continue Synthroid Hyperlipidemia -Continue statin L-Knee Pain -??fall onto knee -uric acid--5.9 -now resolved Hypokalemia -repleted Anemia of CKD -B12 marginally low -supplement  Discharge Condition: stable  Disposition: home  Diet:renal with 1200 cc fluid restrict Wt Readings from Last 3 Encounters:  10/25/15 71.8 kg (158 lb 4.6 oz)  10/07/15 75.932 kg (167 lb 6.4 oz)  09/28/15 77.565 kg (171 lb)    History of present illness:  80 year old male with a history of ESRD (MWF), diabetes mellitus, hypertension, diastolic CHF, procedure fibrillation, hypothyroidism presented with generalized weakness. Apparently, the patient has been feeling weak for the past 2-3 days prior to admission. After dialysis on 10/20/2015, the patient had much difficulty getting out of his car secondary to generalized weakness. Upon arrival to the emergency department, the patient was confused and cannot recall the events of the day. It took him 20 minutes to get out of the car, then 20 minutes to get into the house, and he just seemed like he couldn't walk, urinated on himself, had the shakes, and was incoherent. He was noted to be tachycardic with a temperature 100.54F and tachypnea. He states that aside from his feelings of weakness prior to admission, he was in his usual state of health. He denied any fevers, chills, chest pain, shortness breath, coughing, hemoptysis, nausea,  vomiting, diarrhea, abdominal pain, dysuria, headache, neck pain, rashes. On the afternoon of 10/23/15, the patient  was noted to be confused and also had Afib with RVR concurrently. He was moved to stepdown and placed on diltiazem drip.  He was transitioned to oral diltiazem and tolerated it well. Repeat chest x-ray was obtained because of hypoxemia and showed new right lower lobe infiltrate. The patient was started on initially on ceftazidime. After speaking with nephrology, the patient will be continued on vancomycin for 2 full weeks. Blood cultures remained negative.  Consultants: nephrology  Discharge Exam: Filed Vitals:   10/25/15 1130 10/25/15 1151  BP: 118/60 115/82  Pulse: 75 119  Temp:  97.6 F (36.4 C)  Resp:  22   Filed Vitals:   10/25/15 1030 10/25/15 1103 10/25/15 1130 10/25/15 1151  BP: 120/63 112/67 118/60 115/82  Pulse: 80 73 75 119  Temp:    97.6 F (36.4 C)  TempSrc:    Oral  Resp:    22  Height:      Weight:    71.8 kg (158 lb 4.6 oz)  SpO2:    94%   General:Awake and alert, NAD, pleasant, cooperative Cardiovascular: RRR, no rub, no gallop, no S3 Respiratory: bibasilar rales, rhonchi of the left. No wheeze  Abdomen:soft, nontender, nondistended, positive bowel sounds Extremities: No edema, No lymphangitis, no petechiae  Discharge Instructions      Discharge Instructions    Increase activity slowly    Complete by:  As directed             Medication List    TAKE these medications        calcium acetate 667 MG capsule  Commonly known as:  PHOSLO  Take 1 capsule (667 mg total) by mouth 3 (three) times daily with meals.     carvedilol 12.5 MG tablet  Commonly known as:  COREG  Take 1 tablet (12.5 mg total) by mouth 2 (two) times daily with a meal.     cefTAZidime 2 g in dextrose 5 % 50 mL  Inject 2 g into the vein every Monday, Wednesday, and Friday at 6 PM. Last dose on 10/29/15     cyanocobalamin 500 MCG tablet  Take 1 tablet (500 mcg total)  by mouth daily.     diltiazem 120 MG 24 hr capsule  Commonly known as:  CARDIZEM CD  Take 1 capsule (120 mg total) by mouth daily.     glipiZIDE 5 MG tablet  Commonly known as:  GLUCOTROL  Take 2.5-5 mg by mouth 2 (two) times daily before a meal. Take 1/2 tablet (2.5 mg) daily before breakfast and 1 tablet (5 mg) before supper     levothyroxine 175 MCG tablet  Commonly known as:  SYNTHROID, LEVOTHROID  Take 1 tablet (175 mcg total) by mouth daily.     multivitamin Tabs tablet  Take 1 tablet by mouth daily.     nitroGLYCERIN 0.4 MG SL tablet  Commonly known as:  NITROSTAT  Place 1 tablet (0.4 mg total) under the tongue every 5 (five) minutes as needed for chest pain.     olopatadine 0.1 % ophthalmic solution  Commonly known as:  PATANOL  Place 1 drop into both eyes 2 (two) times daily.     rosuvastatin 20 MG tablet  Commonly known as:  CRESTOR  Take 1 tablet (20 mg total) by mouth daily.     Vancomycin 750 MG/150ML Soln  Commonly known as:  VANCOCIN  Inject 150 mLs (750 mg total) into the vein every Monday, Wednesday, and Friday with hemodialysis. Last dose  on 11/01/15     warfarin 1 MG tablet  Commonly known as:  COUMADIN  Take 1 tablet by mouth daily or as directed by coumadin clinic         The results of significant diagnostics from this hospitalization (including imaging, microbiology, ancillary and laboratory) are listed below for reference.    Significant Diagnostic Studies: Dg Chest 2 View  10/24/2015  CLINICAL DATA:  Nonproductive cough, hypoxemia. EXAM: CHEST  2 VIEW COMPARISON:  Chest x-rays dated 10/20/2015 and 09/04/2015. FINDINGS: Fairly severe cardiomegaly is unchanged. Right chest wall dual lumen catheter is stable in position with tip projected over the right atrium. Central pulmonary vascular congestion and mild bilateral interstitial edema is unchanged, suspected to be chronic. New confluent airspace opacity is seen at the right lung base. IMPRESSION: 1.  New confluent airspace opacity at the right lung base, pneumonia versus aspiration. 2. Stable cardiomegaly. The mild central pulmonary vascular congestion and bilateral interstitial edema is not significantly changed, most likely related to a chronic mild CHF. Electronically Signed   By: Bary Richard M.D.   On: 10/24/2015 11:10   Dg Chest 2 View  10/20/2015  CLINICAL DATA:  Acute onset of altered mental status. Tachycardia. Initial encounter. EXAM: CHEST  2 VIEW COMPARISON:  Chest radiograph performed 09/04/2015 FINDINGS: The lungs are well-aerated. Vascular congestion is noted. A small left pleural effusion is suspected. Increased interstitial markings likely reflect pulmonary edema. There is no evidence of pneumothorax. The heart is enlarged. No acute osseous abnormalities are seen. A right-sided dual-lumen catheter is noted ending about the cavoatrial junction. IMPRESSION: Vascular congestion and cardiomegaly. Small left pleural effusion suspected. Increased interstitial markings likely reflect pulmonary edema. Electronically Signed   By: [name] M.D.   On: 10/20/2015 22:00   Ct Head Wo Contrast  10/20/2015  CLINICAL DATA:  Altered mental status EXAM: CT HEAD WITHOUT CONTRAST TECHNIQUE: Contiguous axial images were obtained from the base of the skull through the vertex without intravenous contrast. COMPARISON:  Head CT July 19, 2015; brain MRI July 20, 2015 FINDINGS: There is stable age related volume loss. There is no intracranial mass, hemorrhage, extra-axial fluid collection, or midline shift. There is stable slight small vessel disease in the centra semiovale bilaterally. Elsewhere gray-white compartments are normal. No acute infarct is evident. Calcification along the falx is stable. Bony calvarium appears intact. The mastoid air cells are clear. No intraorbital lesions are identified. IMPRESSION: Slight periventricular small vessel disease, stable. No intracranial mass, hemorrhage, or new  gray - white compartment lesions/acute appearing infarct. Stable appearance compared to prior studies. Electronically Signed   By: Bretta Bang III M.D.   On: 10/20/2015 21:26     Microbiology: Recent Results (from the past 240 hour(s))  Urine culture     Status: None   Collection Time: 10/20/15  9:15 PM  Result Value Ref Range Status   Specimen Description URINE, RANDOM  Final   Special Requests NONE  Final   Culture NO GROWTH 2 DAYS  Final   Report Status 10/22/2015 FINAL  Final  Culture, blood (routine x 2)     Status: None (Preliminary result)   Collection Time: 10/20/15 10:00 PM  Result Value Ref Range Status   Specimen Description BLOOD RIGHT ARM  Final   Special Requests BOTTLES DRAWN AEROBIC AND ANAEROBIC  Final   Culture NO GROWTH 4 DAYS  Final   Report Status PENDING  Incomplete  Culture, blood (routine x 2)  Status: None (Preliminary result)   Collection Time: 10/20/15 10:24 PM  Result Value Ref Range Status   Specimen Description BLOOD RIGHT HAND  Final   Special Requests BOTTLES DRAWN AEROBIC AND ANAEROBIC 4ML  Final   Culture NO GROWTH 4 DAYS  Final   Report Status PENDING  Incomplete  MRSA PCR Screening     Status: None   Collection Time: 10/21/15  1:41 AM  Result Value Ref Range Status   MRSA by PCR NEGATIVE NEGATIVE Final    Comment:        The GeneXpert MRSA Assay (FDA approved for NASAL specimens only), is one component of a comprehensive MRSA colonization surveillance program. It is not intended to diagnose MRSA infection nor to guide or monitor treatment for MRSA infections.   MRSA PCR Screening     Status: None   Collection Time: 10/23/15  7:06 PM  Result Value Ref Range Status   MRSA by PCR NEGATIVE NEGATIVE Final    Comment:        The GeneXpert MRSA Assay (FDA approved for NASAL specimens only), is one component of a comprehensive MRSA colonization surveillance program. It is not intended to diagnose MRSA infection nor to guide  or monitor treatment for MRSA infections.      Labs: Basic Metabolic Panel:  Recent Labs Lab 10/21/15 0432 10/22/15 0403 10/23/15 0500 10/24/15 0411 10/25/15 0753  NA 138 139 140 137 138  K 2.5* 3.1* 3.6 4.6 4.6  CL 98* 98* 99* 100* 99*  CO2 28 27 26 24 25   GLUCOSE 74 111* 119* 130* 152*  BUN 31* 44* 60* 27* 43*  CREATININE 3.99* 5.86* 7.40* 4.62* 6.68*  CALCIUM 8.5* 8.8* 8.9 8.8* 8.9  PHOS  --   --   --   --  4.1   Liver Function Tests:  Recent Labs Lab 10/20/15 2004 10/25/15 0753  AST 45*  --   ALT 24  --   ALKPHOS 91  --   BILITOT 0.8  --   PROT 7.8  --   ALBUMIN 2.6* 2.2*   No results for input(s): LIPASE, AMYLASE in the last 168 hours. No results for input(s): AMMONIA in the last 168 hours. CBC:  Recent Labs Lab 10/20/15 2004 10/21/15 0432 10/22/15 0403 10/24/15 0411 10/25/15 0753  WBC 5.7 4.3 4.8 4.4 4.8  NEUTROABS 3.9  --   --   --  3.2  HGB 12.4* 10.4* 10.5* 12.2* 10.9*  HCT 39.9 34.8* 35.1* 39.8 35.6*  MCV 97.8 98.9 97.5 98.0 96.0  PLT 175 132* 145* 144* 121*   Cardiac Enzymes: No results for input(s): CKTOTAL, CKMB, CKMBINDEX, TROPONINI in the last 168 hours. BNP: Invalid input(s): POCBNP CBG:  Recent Labs Lab 10/21/15 0106 10/24/15 1726  GLUCAP 94 165*    Time coordinating discharge:  Greater than 30 minutes  Signed:  Rosaly Labarbera, DO [facility] Pager: [phone] 10/25/2015, 2:34 PM

## 2015-10-25 NOTE — Progress Notes (Signed)
[name] [name] to be D/C'd Home per MD order.  Discussed prescriptions and follow up appointments with the patient. Prescriptions given to patient, medication list explained in detail. Pt verbalized understanding.    Medication List    TAKE these medications        calcium acetate 667 MG capsule  Commonly known as:  PHOSLO  Take 1 capsule (667 mg total) by mouth 3 (three) times daily with meals.     carvedilol 12.5 MG tablet  Commonly known as:  COREG  Take 1 tablet (12.5 mg total) by mouth 2 (two) times daily with a meal.     cefTAZidime 2 g in dextrose 5 % 50 mL  Inject 2 g into the vein every Monday, Wednesday, and Friday at 6 PM. Last dose on 10/29/15     cyanocobalamin 500 MCG tablet  Take 1 tablet (500 mcg total) by mouth daily.     diltiazem 120 MG 24 hr capsule  Commonly known as:  CARDIZEM CD  Take 1 capsule (120 mg total) by mouth daily.     glipiZIDE 5 MG tablet  Commonly known as:  GLUCOTROL  Take 2.5-5 mg by mouth 2 (two) times daily before a meal. Take 1/2 tablet (2.5 mg) daily before breakfast and 1 tablet (5 mg) before supper     levothyroxine 175 MCG tablet  Commonly known as:  SYNTHROID, LEVOTHROID  Take 1 tablet (175 mcg total) by mouth daily.     multivitamin Tabs tablet  Take 1 tablet by mouth daily.     nitroGLYCERIN 0.4 MG SL tablet  Commonly known as:  NITROSTAT  Place 1 tablet (0.4 mg total) under the tongue every 5 (five) minutes as needed for chest pain.     olopatadine 0.1 % ophthalmic solution  Commonly known as:  PATANOL  Place 1 drop into both eyes 2 (two) times daily.     rosuvastatin 20 MG tablet  Commonly known as:  CRESTOR  Take 1 tablet (20 mg total) by mouth daily.     Vancomycin 750 MG/150ML Soln  Commonly known as:  VANCOCIN  Inject 150 mLs (750 mg total) into the vein every Monday, Wednesday, and Friday with hemodialysis. Last dose on 11/01/15     warfarin 1 MG tablet  Commonly known as:  COUMADIN  Take 1 tablet by mouth  daily or as directed by coumadin clinic        Filed Vitals:   10/25/15 1130 10/25/15 1151  BP: 118/60 115/82  Pulse: 75 119  Temp:  97.6 F (36.4 C)  Resp:  22    Skin clean, dry and intact without evidence of skin break down, no evidence of skin tears noted. IV catheter discontinued intact. Site without signs and symptoms of complications. Dressing and pressure applied. Pt denies pain at this time. No complaints noted.  An After Visit Summary was printed and given to the patient. Patient escorted via WC, and D/C home via private auto.  Janeann ForehandLuke Trenee [name] BSN, RN

## 2015-10-25 NOTE — Progress Notes (Signed)
PT Cancellation Note  Patient Details Name: Ernestine ConradRoy L Holliman MRN: [id number] DOB: 09/20/1936   Cancelled Treatment:    Reason Eval/Treat Not Completed: Patient at procedure or test/unavailable. Pt at HD. PT to re-attempt as able.   Marcene BrawnChadwell, Kiyan Burmester Marie 10/25/2015, 8:02 AM   Lewis ShockAshly Leeon Makar, PT, DPT Pager #: [phone] Office #: [phone]

## 2015-10-26 ENCOUNTER — Telehealth: Payer: Self-pay

## 2015-10-26 ENCOUNTER — Ambulatory Visit (INDEPENDENT_AMBULATORY_CARE_PROVIDER_SITE_OTHER): Payer: Medicare Other | Admitting: Pharmacist Clinician (PhC)/ Clinical Pharmacy Specialist

## 2015-10-26 DIAGNOSIS — I482 Chronic atrial fibrillation, unspecified: Secondary | ICD-10-CM

## 2015-10-26 DIAGNOSIS — Z7901 Long term (current) use of anticoagulants: Secondary | ICD-10-CM

## 2015-10-26 LAB — POCT INR: INR: 3.1

## 2015-10-26 NOTE — Telephone Encounter (Signed)
Transition Care Management Follow-up Telephone Call  How have you been since you were released from the hospital? Pt is doing better    Do you understand why you were in the hospital? yes   Do you understand the discharge instrcutions? yes  Items Reviewed:  Medications reviewed: yes  Allergies reviewed: yes  Dietary changes reviewed: yes  Referrals reviewed: yes   Functional Questionnaire:   Activities of Daily Living (ADLs):   He states they are independent in the following: ambulation, bathing and hygiene, feeding, continence, grooming, toileting and dressing States they require assistance with the following: None   Any transportation issues/concerns?: no   Any patient concerns? no   Confirmed importance and date/time of follow-up visits scheduled: yes   Confirmed with patient if condition begins to worsen call PCP or go to the ER.  Patient was given the Call-a-Nurse line [phone]: yes  Pt has a follow up appt on Tuesday the 11/09/15 at 3:45

## 2015-10-27 ENCOUNTER — Telehealth: Payer: Self-pay | Admitting: Cardiology

## 2015-10-27 NOTE — Telephone Encounter (Signed)
Thayer OhmChris calling from patient's dialysis center. As courtesy, he knew pt recently d/ced from hospital and wanted to make sure he knew who was managing patient's coumadin. Explained pt gets weekly results from home INR checks and it looks like we have been checking these going back some time. Thayer OhmChris offered that since patient gets weekly blood draws at dialysis already, it would be possible for them to provide service of drawing INR there and sending us the notifications if we/patient wanted. He states they already provide this service for many patients. Informed him I would have Kristin review.

## 2015-10-28 ENCOUNTER — Telehealth: Payer: Self-pay | Admitting: Family Medicine

## 2015-10-28 NOTE — Telephone Encounter (Signed)
Returned [employer] call and provided VO.

## 2015-10-28 NOTE — Telephone Encounter (Signed)
[facility] plan [facility] have [facility]

## 2015-10-28 NOTE — Telephone Encounter (Signed)
[name] [name] called from [facility] to ask for verbal orders for social worker to come out and assist with placement.   [phone]

## 2015-10-30 ENCOUNTER — Emergency Department (HOSPITAL_COMMUNITY): Payer: Medicare Other

## 2015-10-30 ENCOUNTER — Encounter (HOSPITAL_COMMUNITY): Payer: Self-pay | Admitting: Emergency Medicine

## 2015-10-30 ENCOUNTER — Inpatient Hospital Stay (HOSPITAL_COMMUNITY)
Admission: EM | Admit: 2015-10-30 | Discharge: 2015-11-11 | DRG: 871 | Disposition: A | Discharge status: Hospice | Payer: Medicare Other | Attending: Internal Medicine | Admitting: Internal Medicine

## 2015-10-30 DIAGNOSIS — M1000 Idiopathic gout, unspecified site: Secondary | ICD-10-CM | POA: Diagnosis not present

## 2015-10-30 DIAGNOSIS — M25569 Pain in unspecified knee: Secondary | ICD-10-CM

## 2015-10-30 DIAGNOSIS — E878 Other disorders of electrolyte and fluid balance, not elsewhere classified: Secondary | ICD-10-CM | POA: Diagnosis present

## 2015-10-30 DIAGNOSIS — G9341 Metabolic encephalopathy: Secondary | ICD-10-CM | POA: Diagnosis present

## 2015-10-30 DIAGNOSIS — I482 Chronic atrial fibrillation: Secondary | ICD-10-CM | POA: Diagnosis present

## 2015-10-30 DIAGNOSIS — E890 Postprocedural hypothyroidism: Secondary | ICD-10-CM | POA: Diagnosis present

## 2015-10-30 DIAGNOSIS — I4891 Unspecified atrial fibrillation: Secondary | ICD-10-CM | POA: Diagnosis present

## 2015-10-30 DIAGNOSIS — I2510 Atherosclerotic heart disease of native coronary artery without angina pectoris: Secondary | ICD-10-CM | POA: Diagnosis present

## 2015-10-30 DIAGNOSIS — E1129 Type 2 diabetes mellitus with other diabetic kidney complication: Secondary | ICD-10-CM | POA: Diagnosis present

## 2015-10-30 DIAGNOSIS — Z515 Encounter for palliative care: Secondary | ICD-10-CM | POA: Diagnosis not present

## 2015-10-30 DIAGNOSIS — B3749 Other urogenital candidiasis: Secondary | ICD-10-CM | POA: Diagnosis present

## 2015-10-30 DIAGNOSIS — L8990 Pressure ulcer of unspecified site, unspecified stage: Secondary | ICD-10-CM | POA: Insufficient documentation

## 2015-10-30 DIAGNOSIS — Z87891 Personal history of nicotine dependence: Secondary | ICD-10-CM

## 2015-10-30 DIAGNOSIS — Z7984 Long term (current) use of oral hypoglycemic drugs: Secondary | ICD-10-CM

## 2015-10-30 DIAGNOSIS — E785 Hyperlipidemia, unspecified: Secondary | ICD-10-CM | POA: Diagnosis present

## 2015-10-30 DIAGNOSIS — E1122 Type 2 diabetes mellitus with diabetic chronic kidney disease: Secondary | ICD-10-CM | POA: Diagnosis present

## 2015-10-30 DIAGNOSIS — Z79899 Other long term (current) drug therapy: Secondary | ICD-10-CM

## 2015-10-30 DIAGNOSIS — F0390 Unspecified dementia without behavioral disturbance: Secondary | ICD-10-CM | POA: Diagnosis present

## 2015-10-30 DIAGNOSIS — D696 Thrombocytopenia, unspecified: Secondary | ICD-10-CM | POA: Diagnosis not present

## 2015-10-30 DIAGNOSIS — R6520 Severe sepsis without septic shock: Secondary | ICD-10-CM | POA: Diagnosis present

## 2015-10-30 DIAGNOSIS — K761 Chronic passive congestion of liver: Secondary | ICD-10-CM | POA: Diagnosis present

## 2015-10-30 DIAGNOSIS — R740 Nonspecific elevation of levels of transaminase and lactic acid dehydrogenase [LDH]: Secondary | ICD-10-CM

## 2015-10-30 DIAGNOSIS — N186 End stage renal disease: Secondary | ICD-10-CM

## 2015-10-30 DIAGNOSIS — N2581 Secondary hyperparathyroidism of renal origin: Secondary | ICD-10-CM | POA: Diagnosis present

## 2015-10-30 DIAGNOSIS — D631 Anemia in chronic kidney disease: Secondary | ICD-10-CM | POA: Diagnosis present

## 2015-10-30 DIAGNOSIS — Y95 Nosocomial condition: Secondary | ICD-10-CM | POA: Diagnosis present

## 2015-10-30 DIAGNOSIS — Z9861 Coronary angioplasty status: Secondary | ICD-10-CM

## 2015-10-30 DIAGNOSIS — I5022 Chronic systolic (congestive) heart failure: Secondary | ICD-10-CM | POA: Diagnosis present

## 2015-10-30 DIAGNOSIS — R509 Fever, unspecified: Secondary | ICD-10-CM | POA: Insufficient documentation

## 2015-10-30 DIAGNOSIS — I10 Essential (primary) hypertension: Secondary | ICD-10-CM | POA: Diagnosis present

## 2015-10-30 DIAGNOSIS — Z8614 Personal history of Methicillin resistant Staphylococcus aureus infection: Secondary | ICD-10-CM

## 2015-10-30 DIAGNOSIS — L89322 Pressure ulcer of left buttock, stage 2: Secondary | ICD-10-CM | POA: Diagnosis present

## 2015-10-30 DIAGNOSIS — E876 Hypokalemia: Secondary | ICD-10-CM | POA: Diagnosis not present

## 2015-10-30 DIAGNOSIS — A419 Sepsis, unspecified organism: Secondary | ICD-10-CM | POA: Diagnosis not present

## 2015-10-30 DIAGNOSIS — I132 Hypertensive heart and chronic kidney disease with heart failure and with stage 5 chronic kidney disease, or end stage renal disease: Secondary | ICD-10-CM | POA: Diagnosis present

## 2015-10-30 DIAGNOSIS — R791 Abnormal coagulation profile: Secondary | ICD-10-CM | POA: Diagnosis present

## 2015-10-30 DIAGNOSIS — E7220 Disorder of urea cycle metabolism, unspecified: Secondary | ICD-10-CM | POA: Diagnosis present

## 2015-10-30 DIAGNOSIS — N189 Chronic kidney disease, unspecified: Secondary | ICD-10-CM

## 2015-10-30 DIAGNOSIS — I248 Other forms of acute ischemic heart disease: Secondary | ICD-10-CM | POA: Diagnosis present

## 2015-10-30 DIAGNOSIS — R4182 Altered mental status, unspecified: Secondary | ICD-10-CM | POA: Diagnosis not present

## 2015-10-30 DIAGNOSIS — Z992 Dependence on renal dialysis: Secondary | ICD-10-CM

## 2015-10-30 DIAGNOSIS — E872 Acidosis: Secondary | ICD-10-CM | POA: Diagnosis present

## 2015-10-30 DIAGNOSIS — T45515A Adverse effect of anticoagulants, initial encounter: Secondary | ICD-10-CM | POA: Diagnosis present

## 2015-10-30 DIAGNOSIS — Z66 Do not resuscitate: Secondary | ICD-10-CM | POA: Diagnosis not present

## 2015-10-30 DIAGNOSIS — Z7901 Long term (current) use of anticoagulants: Secondary | ICD-10-CM

## 2015-10-30 DIAGNOSIS — R627 Adult failure to thrive: Secondary | ICD-10-CM | POA: Diagnosis present

## 2015-10-30 DIAGNOSIS — M898X9 Other specified disorders of bone, unspecified site: Secondary | ICD-10-CM | POA: Diagnosis present

## 2015-10-30 DIAGNOSIS — I959 Hypotension, unspecified: Secondary | ICD-10-CM | POA: Diagnosis not present

## 2015-10-30 DIAGNOSIS — K819 Cholecystitis, unspecified: Secondary | ICD-10-CM

## 2015-10-30 DIAGNOSIS — Z8673 Personal history of transient ischemic attack (TIA), and cerebral infarction without residual deficits: Secondary | ICD-10-CM

## 2015-10-30 DIAGNOSIS — M1712 Unilateral primary osteoarthritis, left knee: Secondary | ICD-10-CM | POA: Diagnosis present

## 2015-10-30 DIAGNOSIS — G9340 Encephalopathy, unspecified: Secondary | ICD-10-CM | POA: Insufficient documentation

## 2015-10-30 DIAGNOSIS — K7290 Hepatic failure, unspecified without coma: Secondary | ICD-10-CM | POA: Diagnosis not present

## 2015-10-30 DIAGNOSIS — J189 Pneumonia, unspecified organism: Secondary | ICD-10-CM | POA: Diagnosis present

## 2015-10-30 DIAGNOSIS — R7401 Elevation of levels of liver transaminase levels: Secondary | ICD-10-CM

## 2015-10-30 LAB — CBG MONITORING, ED: Glucose-Capillary: 73 mg/dL (ref 65–99)

## 2015-10-30 LAB — I-STAT CG4 LACTIC ACID, ED: Lactic Acid, Venous: 1.91 mmol/L (ref 0.5–2.0)

## 2015-10-30 MED ORDER — SODIUM CHLORIDE 0.9 % IV BOLUS (SEPSIS)
1000.0000 mL | INTRAVENOUS | Status: AC
  Administered 2015-10-30 (×2): 1000 mL via INTRAVENOUS

## 2015-10-30 MED ORDER — VANCOMYCIN HCL 10 G IV SOLR
1500.0000 mg | INTRAVENOUS | Status: AC
  Administered 2015-10-31: 1500 mg via INTRAVENOUS
  Filled 2015-10-30: qty 1500

## 2015-10-30 MED ORDER — VANCOMYCIN HCL IN DEXTROSE 1-5 GM/200ML-% IV SOLN: 1000.0000 mg | Freq: Once | INTRAVENOUS | Status: DC

## 2015-10-30 MED ORDER — ACETAMINOPHEN 650 MG RE SUPP
650.0000 mg | Freq: Once | RECTAL | Status: AC
  Administered 2015-10-30: 650 mg via RECTAL
  Filled 2015-10-30: qty 1

## 2015-10-30 MED ORDER — SODIUM CHLORIDE 0.9 % IV BOLUS (SEPSIS)
500.0000 mL | INTRAVENOUS | Status: AC
  Administered 2015-10-30: 500 mL via INTRAVENOUS

## 2015-10-30 MED ORDER — PIPERACILLIN-TAZOBACTAM 3.375 G IVPB 30 MIN
3.3750 g | Freq: Once | INTRAVENOUS | Status: AC
  Administered 2015-10-30: 3.375 g via INTRAVENOUS
  Filled 2015-10-30: qty 50

## 2015-10-30 NOTE — ED Notes (Signed)
Stage 2 wound present on arrival to ED on his left buttocks around 6 cm long. Foam dressing applied.

## 2015-10-30 NOTE — ED Notes (Signed)
Pt brought by [facility] from home for complain of increase of confusion, no able to walk for a week, no eating or drinking or following commands, pt had a HD cath placed on his left chest, family believe that this may be infected, denies n/v/d. Pt Afib on EMS EKG, CBG 90, BP 118/60, HR 108,  80% on RA placed on NRM up to 98%.

## 2015-10-30 NOTE — ED Provider Notes (Signed)
CSN: [id number]     Arrival date & time 10/30/15  2305 History   By signing my name below, I, [name] [name], attest that this documentation has been prepared under the direction and in the presence of [name] Rhine, MD.  Electronically Signed: Arlan [name], ED Scribe. 10/30/2015. 11:36 PM.   Chief Complaint  Patient presents with  . Altered Mental Status   The history is provided by a relative and the EMS personnel. No language interpreter was used.    LEVEL 5 CAVEAT- DUE TO ALTERED MENTAL STATUS; MAJORITY OF HISTORY PROVIDED BY FAMILY AND NURSE   HPI Comments: [name] [name] brought in by EMS is a 80 y.o. male with a PMHx of CAD, HTN, hyperlipidemia, CHF, DM, CKD who presents to the Emergency Department here for altered mental status this evening. Per family, pt has been confused, not following commands with decreased appetite. Pt has not been eating or drinking and refusing to take his daily medications. Family states this has been ongoing for a few days now but recently worsened in the last 24 hours. Son states pt has not used the bathroom all day which is not normal for him. Last known normal unknown. Last dialysis treatment yesterday. Family states pt was discharged from the hospital this week after a fall, however, son states pt sustained another fall 4 days ago. Pt lives at home with his wife and daughter.  PCP: [name] Conch, MD    Past Medical History  Diagnosis Date  . CAD (coronary artery disease)     a. s/p PCI to mid and mid-distal RCA, 40% sten L main, no sig dz LCx  b. myoview 11/24/2014 inferoapex ischemia, medical management due to high risk for contrast nephropathy  . HTN (hypertension)   . Hyperlipidemia   . Cerebrovascular accident (HCC)   . CHF (congestive heart failure) (HCC)     EF 35% echo 2012 ; EF 44% myoview 2012; EF 76% by [facility] November 2014  . Diabetes mellitus   . 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 Unspecified hypothyroidism   . Gout    . Respiratory failure, acute (HCC) 07/21/2015   Past Surgical History  Procedure Laterality Date  . Thyroidectomy    . Av fistula placement Left 09/10/2015    Procedure: BRACHIOCEPHALIC ARTERIOVENOUS (AV) FISTULA CREATION;  Surgeon: Nada Libman, MD;  Location: [facility] OR;  Service: Vascular;  Laterality: Left;   Family History  Problem Relation Age of Onset  . Kidney failure Mother   . Diabetes Mother   . Cancer Father     lung  . Heart attack Brother     massive heart attack in 39s  . Hypertension Mother    Social History  Substance Use Topics  . Smoking status: Former Smoker -- 0.50 packs/day    Types: Cigarettes    Quit date: 10/16/1953  . Smokeless tobacco: Not on file     Comment: quit in the 70's  . Alcohol Use: No    Review of Systems  Unable to perform ROS: Mental status change      Allergies  Tomato  Home Medications   Prior to Admission medications   Medication Sig Start Date End Date Taking? Authorizing Provider  calcium acetate (PHOSLO) 667 MG capsule Take 1 capsule (667 mg total) by mouth 3 (three) times daily with meals. 09/30/15   [name] Majestic, MD  carvedilol (COREG) 12.5 MG tablet Take 1 tablet (12.5 mg total) by  mouth 2 (two) times daily with a meal. 01/11/15   [name] Pattyaniel R Bensimhon, MD  cefTAZidime 2 g in dextrose 5 % 50 mL Inject 2 g into the vein every Monday, Wednesday, and Friday at 6 PM. Last dose on 10/29/15 10/25/15   [name] Hartshornavid Tat, MD  diltiazem (CARDIZEM CD) 120 MG 24 hr capsule Take 1 capsule (120 mg total) by mouth daily. 10/25/15   [name] Hartshornavid Tat, MD  glipiZIDE (GLUCOTROL) 5 MG tablet Take 2.5-5 mg by mouth 2 (two) times daily before a meal. Take 1/2 tablet (2.5 mg) daily before breakfast and 1 tablet (5 mg) before supper 05/01/15   Historical Provider, MD  levothyroxine (SYNTHROID, LEVOTHROID) 175 MCG tablet Take 1 tablet (175 mcg total) by mouth daily. 08/13/15   [name] MajesticStephen O Hunter, MD  multivitamin (RENA-VIT) TABS tablet Take 1 tablet by mouth daily.     Historical Provider, MD  nitroGLYCERIN (NITROSTAT) 0.4 MG SL tablet Place 1 tablet (0.4 mg total) under the tongue every 5 (five) minutes as needed for chest pain. 11/25/14 04/25/17  [name] CourseHao Meng, PA  olopatadine (PATANOL) 0.1 % ophthalmic solution Place 1 drop into both eyes 2 (two) times daily. Patient taking differently: Place 1 drop into both eyes daily.  04/20/15   [name] MajesticStephen O Hunter, MD  rosuvastatin (CRESTOR) 20 MG tablet Take 1 tablet (20 mg total) by mouth daily. 10/07/15   [name] BuntingBrian S Crenshaw, MD  Vancomycin (VANCOCIN) 750 MG/150ML SOLN Inject 150 mLs (750 mg total) into the vein every Monday, Wednesday, and Friday with hemodialysis. Last dose on 11/01/15 10/25/15   [name] Hartshornavid Tat, MD  vitamin B-12 500 MCG tablet Take 1 tablet (500 mcg total) by mouth daily. 10/25/15   [name] Hartshornavid Tat, MD  warfarin (COUMADIN) 1 MG tablet Take 1 tablet by mouth daily or as directed by coumadin clinic 10/25/15   [name] Hartshornavid Tat, MD   Triage Vitals: BP 105/79 mmHg  Pulse 94  Temp(Src) 102.6 F (39.2 C) (Rectal)  Resp 27  Ht 5\' 9"  (1.753 m)  Wt 158 lb (71.668 kg)  BMI 23.32 kg/m2  SpO2 92%   Physical Exam  CONSTITUTIONAL: Elderly and frail  HEAD: Normocephalic/atraumatic, no signs of trauma EYES: EOMI ENMT: Mucous membranes moist NECK: supple no meningeal signs SPINE/BACK:entire spine nontender CV: S1/S2 noted. Tachycardiac  LUNGS: Decreased breath sounds bilaterally  ABDOMEN: soft, nontender, no rebound or guarding, bowel sounds noted throughout abdomen NEURO: Pt is awake/alert/appropriate but does not respond to all questioning. Pt appears confused  EXTREMITIES: pulses normal/equal, full ROM, dialysis access to left UE with thrill noted SKIN: warm, color normal. Dialysis catheter to R chest and sacral skin breakdown noted  PSYCH: Unable to assess   ED [name]  Procedures   DIAGNOSTIC STUDIES: Oxygen Saturation is 92% on [place], low by my interpretation.    COORDINATION OF CARE: 11:22 PM-Will give fluids, Tylenol, and  Vancocin. Will order CXR, i-stat CG4 lactic acid, CMP, CBC, urinalysis, PT-INR, CBG, EKG. Discussed treatment plan with pt at bedside and pt agreed to plan.     1:16 AM- Spoke with Dr. Julian ReilGardner and he will admit the pt. Pt more alert and following commands, moving all extremities He denies any pain Vitals have normalized Unclear source of fever as CXR shows more vascular congestion Bacteremia possible due to HD catheter Broad spectrum ABX started Family updated on plan He may need to have vascular consult to see if his access in left UE can be utilized Labs Review Labs Reviewed  COMPREHENSIVE METABOLIC PANEL - Abnormal;  Notable for the following:    Chloride 93 (*)    BUN 41 (*)    Creatinine, Ser 6.49 (*)    Albumin 2.3 (*)    AST 55 (*)    ALT 8 (*)    GFR calc non Af Amer 7 (*)    GFR calc Af Amer 9 (*)    Anion gap 17 (*)    All other components within normal limits  CBC WITH DIFFERENTIAL/PLATELET - Abnormal; Notable for the following:    RBC 4.05 (*)    Hemoglobin 12.0 (*)    HCT 38.6 (*)    RDW 17.5 (*)    All other components within normal limits  PROTIME-INR - Abnormal; Notable for the following:    Prothrombin Time 27.8 (*)    INR 2.64 (*)    All other components within normal limits  I-STAT ARTERIAL BLOOD GAS, ED - Abnormal; Notable for the following:    pH, Arterial 7.517 (*)    pO2, Arterial 374.0 (*)    Bicarbonate 29.0 (*)    Acid-Base Excess 6.0 (*)    All other components within normal limits  CULTURE, BLOOD (ROUTINE X 2)  CULTURE, BLOOD (ROUTINE X 2)  URINE CULTURE  URINALYSIS, ROUTINE W REFLEX MICROSCOPIC (NOT AT [facility])  CBG MONITORING, ED  I-STAT CG4 LACTIC ACID, ED    Imaging Review Dg Chest Port 1 View  10/30/2015  CLINICAL DATA:  Acute onset of shortness of breath. Initial encounter. EXAM: PORTABLE CHEST 1 VIEW COMPARISON:  Chest radiograph performed 10/24/2015 FINDINGS: The patient's right-sided dual-lumen catheter is noted ending overlying the  right atrium. The lungs are well-aerated. Vascular congestion is noted. Increased interstitial markings raise concern for mild interstitial edema. A small left pleural effusion may be present. No pneumothorax is seen. The cardiomediastinal silhouette is mildly enlarged. No acute osseous abnormalities are seen. IMPRESSION: Vascular congestion and mild cardiomegaly. Increased interstitial markings raise concern for mild interstitial edema. Small left pleural effusion may be present. Electronically Signed   By: [name] M.D.   On: 10/30/2015 23:54   I have personally reviewed and evaluated these images and lab results as part of my medical decision-making.   EKG Interpretation   Date/Time:  Saturday October 30 2015 23:28:11 EST Ventricular Rate:  100 PR Interval:    QRS Duration: 114 QT Interval:  413 QTC Calculation: 533 R Axis:   102 Text Interpretation:  Atrial fibrillation Ventricular premature complex  Borderline intraventricular conduction delay Borderline repolarization  abnormality Prolonged QT interval Artifact in lead(s) I III aVR aVL  significant artifact limits evaluation Confirmed by Bebe Shaggy  MD, Dorinda Hill  [phone]) on 10/30/2015 11:38:17 PM      MDM   Final diagnoses:  Sepsis, due to unspecified organism (HCC)  Chronic renal failure, unspecified stage    Nursing notes including past medical history and social history reviewed and considered in documentation xrays/imaging reviewed by myself and considered during evaluation Labs/vital reviewed myself and considered during evaluation    I personally performed the services described in this documentation, which was scribed in my presence. The recorded information has been reviewed and is accurate.        [name] Rhine, MD 10/31/15 [phone]

## 2015-10-31 DIAGNOSIS — N2581 Secondary hyperparathyroidism of renal origin: Secondary | ICD-10-CM | POA: Diagnosis present

## 2015-10-31 DIAGNOSIS — E1121 Type 2 diabetes mellitus with diabetic nephropathy: Secondary | ICD-10-CM | POA: Diagnosis not present

## 2015-10-31 DIAGNOSIS — M898X9 Other specified disorders of bone, unspecified site: Secondary | ICD-10-CM | POA: Diagnosis present

## 2015-10-31 DIAGNOSIS — E7220 Disorder of urea cycle metabolism, unspecified: Secondary | ICD-10-CM | POA: Diagnosis present

## 2015-10-31 DIAGNOSIS — T45515A Adverse effect of anticoagulants, initial encounter: Secondary | ICD-10-CM | POA: Diagnosis present

## 2015-10-31 DIAGNOSIS — R509 Fever, unspecified: Secondary | ICD-10-CM | POA: Diagnosis not present

## 2015-10-31 DIAGNOSIS — E878 Other disorders of electrolyte and fluid balance, not elsewhere classified: Secondary | ICD-10-CM | POA: Diagnosis present

## 2015-10-31 DIAGNOSIS — K761 Chronic passive congestion of liver: Secondary | ICD-10-CM | POA: Diagnosis present

## 2015-10-31 DIAGNOSIS — E872 Acidosis: Secondary | ICD-10-CM | POA: Diagnosis present

## 2015-10-31 DIAGNOSIS — Z9861 Coronary angioplasty status: Secondary | ICD-10-CM | POA: Diagnosis not present

## 2015-10-31 DIAGNOSIS — A419 Sepsis, unspecified organism: Secondary | ICD-10-CM | POA: Diagnosis present

## 2015-10-31 DIAGNOSIS — E890 Postprocedural hypothyroidism: Secondary | ICD-10-CM | POA: Diagnosis present

## 2015-10-31 DIAGNOSIS — Z79899 Other long term (current) drug therapy: Secondary | ICD-10-CM | POA: Diagnosis not present

## 2015-10-31 DIAGNOSIS — E1122 Type 2 diabetes mellitus with diabetic chronic kidney disease: Secondary | ICD-10-CM | POA: Diagnosis present

## 2015-10-31 DIAGNOSIS — M1000 Idiopathic gout, unspecified site: Secondary | ICD-10-CM | POA: Diagnosis not present

## 2015-10-31 DIAGNOSIS — I959 Hypotension, unspecified: Secondary | ICD-10-CM | POA: Diagnosis not present

## 2015-10-31 DIAGNOSIS — Z7901 Long term (current) use of anticoagulants: Secondary | ICD-10-CM | POA: Diagnosis not present

## 2015-10-31 DIAGNOSIS — L89322 Pressure ulcer of left buttock, stage 2: Secondary | ICD-10-CM | POA: Diagnosis present

## 2015-10-31 DIAGNOSIS — I2510 Atherosclerotic heart disease of native coronary artery without angina pectoris: Secondary | ICD-10-CM | POA: Diagnosis present

## 2015-10-31 DIAGNOSIS — Z66 Do not resuscitate: Secondary | ICD-10-CM | POA: Diagnosis not present

## 2015-10-31 DIAGNOSIS — R791 Abnormal coagulation profile: Secondary | ICD-10-CM | POA: Diagnosis present

## 2015-10-31 DIAGNOSIS — I248 Other forms of acute ischemic heart disease: Secondary | ICD-10-CM | POA: Diagnosis present

## 2015-10-31 DIAGNOSIS — Z7984 Long term (current) use of oral hypoglycemic drugs: Secondary | ICD-10-CM | POA: Diagnosis not present

## 2015-10-31 DIAGNOSIS — I482 Chronic atrial fibrillation: Secondary | ICD-10-CM | POA: Diagnosis not present

## 2015-10-31 DIAGNOSIS — Z8614 Personal history of Methicillin resistant Staphylococcus aureus infection: Secondary | ICD-10-CM | POA: Diagnosis not present

## 2015-10-31 DIAGNOSIS — I5022 Chronic systolic (congestive) heart failure: Secondary | ICD-10-CM | POA: Diagnosis present

## 2015-10-31 DIAGNOSIS — Z87891 Personal history of nicotine dependence: Secondary | ICD-10-CM | POA: Diagnosis not present

## 2015-10-31 DIAGNOSIS — M1712 Unilateral primary osteoarthritis, left knee: Secondary | ICD-10-CM | POA: Diagnosis present

## 2015-10-31 DIAGNOSIS — D696 Thrombocytopenia, unspecified: Secondary | ICD-10-CM | POA: Diagnosis not present

## 2015-10-31 DIAGNOSIS — Z515 Encounter for palliative care: Secondary | ICD-10-CM | POA: Diagnosis not present

## 2015-10-31 DIAGNOSIS — G9340 Encephalopathy, unspecified: Secondary | ICD-10-CM | POA: Diagnosis not present

## 2015-10-31 DIAGNOSIS — B3749 Other urogenital candidiasis: Secondary | ICD-10-CM | POA: Diagnosis present

## 2015-10-31 DIAGNOSIS — I10 Essential (primary) hypertension: Secondary | ICD-10-CM | POA: Diagnosis not present

## 2015-10-31 DIAGNOSIS — R740 Nonspecific elevation of levels of transaminase and lactic acid dehydrogenase [LDH]: Secondary | ICD-10-CM | POA: Diagnosis not present

## 2015-10-31 DIAGNOSIS — G9341 Metabolic encephalopathy: Secondary | ICD-10-CM | POA: Diagnosis present

## 2015-10-31 DIAGNOSIS — F0390 Unspecified dementia without behavioral disturbance: Secondary | ICD-10-CM | POA: Diagnosis present

## 2015-10-31 DIAGNOSIS — R6520 Severe sepsis without septic shock: Secondary | ICD-10-CM | POA: Diagnosis present

## 2015-10-31 DIAGNOSIS — N186 End stage renal disease: Secondary | ICD-10-CM | POA: Diagnosis not present

## 2015-10-31 DIAGNOSIS — Z992 Dependence on renal dialysis: Secondary | ICD-10-CM | POA: Diagnosis not present

## 2015-10-31 DIAGNOSIS — R627 Adult failure to thrive: Secondary | ICD-10-CM | POA: Diagnosis present

## 2015-10-31 DIAGNOSIS — Y95 Nosocomial condition: Secondary | ICD-10-CM | POA: Diagnosis present

## 2015-10-31 DIAGNOSIS — D631 Anemia in chronic kidney disease: Secondary | ICD-10-CM | POA: Diagnosis present

## 2015-10-31 DIAGNOSIS — Z8673 Personal history of transient ischemic attack (TIA), and cerebral infarction without residual deficits: Secondary | ICD-10-CM | POA: Diagnosis not present

## 2015-10-31 DIAGNOSIS — E785 Hyperlipidemia, unspecified: Secondary | ICD-10-CM | POA: Diagnosis present

## 2015-10-31 DIAGNOSIS — J189 Pneumonia, unspecified organism: Secondary | ICD-10-CM | POA: Diagnosis present

## 2015-10-31 DIAGNOSIS — K7290 Hepatic failure, unspecified without coma: Secondary | ICD-10-CM | POA: Diagnosis not present

## 2015-10-31 DIAGNOSIS — E876 Hypokalemia: Secondary | ICD-10-CM | POA: Diagnosis not present

## 2015-10-31 DIAGNOSIS — R4182 Altered mental status, unspecified: Secondary | ICD-10-CM | POA: Diagnosis present

## 2015-10-31 DIAGNOSIS — I132 Hypertensive heart and chronic kidney disease with heart failure and with stage 5 chronic kidney disease, or end stage renal disease: Secondary | ICD-10-CM | POA: Diagnosis present

## 2015-10-31 LAB — COMPREHENSIVE METABOLIC PANEL
ALBUMIN: 2.3 g/dL — AB (ref 3.5–5.0)
ALT: 8 U/L — AB (ref 17–63)
AST: 55 U/L — AB (ref 15–41)
Alkaline Phosphatase: 99 U/L (ref 38–126)
Anion gap: 17 — ABNORMAL HIGH (ref 5–15)
BUN: 41 mg/dL — AB (ref 6–20)
CHLORIDE: 93 mmol/L — AB (ref 101–111)
CO2: 25 mmol/L (ref 22–32)
CREATININE: 6.49 mg/dL — AB (ref 0.61–1.24)
Calcium: 9.3 mg/dL (ref 8.9–10.3)
GFR calc Af Amer: 9 mL/min — ABNORMAL LOW (ref >60)
GFR calc non Af Amer: 7 mL/min — ABNORMAL LOW (ref >60)
GLUCOSE: 74 mg/dL (ref 65–99)
POTASSIUM: 3.8 mmol/L (ref 3.5–5.1)
Sodium: 135 mmol/L (ref 135–145)
Total Bilirubin: 0.6 mg/dL (ref 0.3–1.2)
Total Protein: 7.3 g/dL (ref 6.5–8.1)

## 2015-10-31 LAB — BASIC METABOLIC PANEL
Anion gap: 11 (ref 5–15)
Anion gap: 15 (ref 5–15)
BUN: 31 mg/dL — ABNORMAL HIGH (ref 6–20)
BUN: 44 mg/dL — AB (ref 6–20)
CALCIUM: 5.8 mg/dL — AB (ref 8.9–10.3)
CALCIUM: 8.7 mg/dL — AB (ref 8.9–10.3)
CHLORIDE: 112 mmol/L — AB (ref 101–111)
CHLORIDE: 100 mmol/L — AB (ref 101–111)
CO2: 17 mmol/L — AB (ref 22–32)
CO2: 22 mmol/L (ref 22–32)
CREATININE: 4.28 mg/dL — AB (ref 0.61–1.24)
CREATININE: 6.45 mg/dL — AB (ref 0.61–1.24)
GFR calc non Af Amer: 12 mL/min — ABNORMAL LOW (ref >60)
GFR calc non Af Amer: 7 mL/min — ABNORMAL LOW (ref >60)
GFR, EST AFRICAN AMERICAN: 14 mL/min — AB (ref >60)
GFR, EST AFRICAN AMERICAN: 9 mL/min — AB (ref >60)
Glucose, Bld: 104 mg/dL — ABNORMAL HIGH (ref 65–99)
Glucose, Bld: 69 mg/dL (ref 65–99)
Potassium: 2.5 mmol/L — CL (ref 3.5–5.1)
Potassium: 4.3 mmol/L (ref 3.5–5.1)
SODIUM: 140 mmol/L (ref 135–145)
SODIUM: 137 mmol/L (ref 135–145)

## 2015-10-31 LAB — PROTIME-INR
INR: 2.64 — AB (ref 0.00–1.49)
INR: 4.07 — AB (ref 0.00–1.49)
PROTHROMBIN TIME: 38.5 s — AB (ref 11.6–15.2)
Prothrombin Time: 27.8 seconds — ABNORMAL HIGH (ref 11.6–15.2)

## 2015-10-31 LAB — CBC
HEMATOCRIT: 28.5 % — AB (ref 39.0–52.0)
Hemoglobin: 8.5 g/dL — ABNORMAL LOW (ref 13.0–17.0)
MCH: 28.4 pg (ref 26.0–34.0)
MCHC: 29.8 g/dL — AB (ref 30.0–36.0)
MCV: 95.3 fL (ref 78.0–100.0)
Platelets: 97 10*3/uL — ABNORMAL LOW (ref 150–400)
RBC: 2.99 MIL/uL — ABNORMAL LOW (ref 4.22–5.81)
RDW: 17.8 % — AB (ref 11.5–15.5)
WBC: 6.3 10*3/uL (ref 4.0–10.5)

## 2015-10-31 LAB — I-STAT ARTERIAL BLOOD GAS, ED
ACID-BASE EXCESS: 6 mmol/L — AB (ref 0.0–2.0)
Bicarbonate: 29 mEq/L — ABNORMAL HIGH (ref 20.0–24.0)
O2 SAT: 100 %
PCO2 ART: 35.7 mmHg (ref 35.0–45.0)
TCO2: 30 mmol/L (ref 0–100)
pH, Arterial: 7.517 — ABNORMAL HIGH (ref 7.350–7.450)
pO2, Arterial: 374 mmHg — ABNORMAL HIGH (ref 80.0–100.0)

## 2015-10-31 LAB — CBC WITH DIFFERENTIAL/PLATELET
BASOS PCT: 1 %
Basophils Absolute: 0.1 10*3/uL (ref 0.0–0.1)
EOS PCT: 1 %
Eosinophils Absolute: 0.1 10*3/uL (ref 0.0–0.7)
HEMATOCRIT: 38.6 % — AB (ref 39.0–52.0)
HEMOGLOBIN: 12 g/dL — AB (ref 13.0–17.0)
LYMPHS ABS: 1.5 10*3/uL (ref 0.7–4.0)
Lymphocytes Relative: 22 %
MCH: 29.6 pg (ref 26.0–34.0)
MCHC: 31.1 g/dL (ref 30.0–36.0)
MCV: 95.3 fL (ref 78.0–100.0)
MONO ABS: 0.7 10*3/uL (ref 0.1–1.0)
MONOS PCT: 11 %
NEUTROS ABS: 4.4 10*3/uL (ref 1.7–7.7)
Neutrophils Relative %: 65 %
Platelets: 165 10*3/uL (ref 150–400)
RBC: 4.05 MIL/uL — AB (ref 4.22–5.81)
RDW: 17.5 % — ABNORMAL HIGH (ref 11.5–15.5)
WBC: 6.8 10*3/uL (ref 4.0–10.5)

## 2015-10-31 LAB — GLUCOSE, CAPILLARY
GLUCOSE-CAPILLARY: 190 mg/dL — AB (ref 65–99)
Glucose-Capillary: 72 mg/dL (ref 65–99)
Glucose-Capillary: 155 mg/dL — ABNORMAL HIGH (ref 65–99)
Glucose-Capillary: 64 mg/dL — ABNORMAL LOW (ref 65–99)
Glucose-Capillary: 175 mg/dL — ABNORMAL HIGH (ref 65–99)
Glucose-Capillary: 170 mg/dL — ABNORMAL HIGH (ref 65–99)

## 2015-10-31 LAB — RENAL FUNCTION PANEL
ALBUMIN: 1.9 g/dL — AB (ref 3.5–5.0)
ANION GAP: 16 — AB (ref 5–15)
BUN: 48 mg/dL — ABNORMAL HIGH (ref 6–20)
CHLORIDE: 99 mmol/L — AB (ref 101–111)
CO2: 22 mmol/L (ref 22–32)
Calcium: 8.8 mg/dL — ABNORMAL LOW (ref 8.9–10.3)
Creatinine, Ser: 6.78 mg/dL — ABNORMAL HIGH (ref 0.61–1.24)
GFR calc Af Amer: 8 mL/min — ABNORMAL LOW (ref >60)
GFR, EST NON AFRICAN AMERICAN: 7 mL/min — AB (ref >60)
Glucose, Bld: 73 mg/dL (ref 65–99)
PHOSPHORUS: 5.8 mg/dL — AB (ref 2.5–4.6)
POTASSIUM: 4.6 mmol/L (ref 3.5–5.1)
Sodium: 137 mmol/L (ref 135–145)

## 2015-10-31 LAB — URINALYSIS, ROUTINE W REFLEX MICROSCOPIC
GLUCOSE, UA: NEGATIVE mg/dL
Ketones, ur: 15 mg/dL — AB
NITRITE: POSITIVE — AB
PH: 5 (ref 5.0–8.0)
Protein, ur: >300 mg/dL — AB
SPECIFIC GRAVITY, URINE: 1.035 — AB (ref 1.005–1.030)

## 2015-10-31 LAB — URINE MICROSCOPIC-ADD ON

## 2015-10-31 LAB — TROPONIN I
TROPONIN I: 0.58 ng/mL — AB (ref <0.031)
Troponin I: 0.46 ng/mL — ABNORMAL HIGH (ref <0.031)
Troponin I: 0.56 ng/mL (ref <0.031)
Troponin I: 0.39 ng/mL — ABNORMAL HIGH (ref <0.031)

## 2015-10-31 LAB — I-STAT CG4 LACTIC ACID, ED: Lactic Acid, Venous: 1.43 mmol/L (ref 0.5–2.0)

## 2015-10-31 MED ORDER — SODIUM CHLORIDE 0.9 % IV SOLN
500.0000 mg | INTRAVENOUS | Status: DC
  Administered 2015-11-01: 500 mg via INTRAVENOUS
  Filled 2015-10-31: qty 0.5

## 2015-10-31 MED ORDER — DILTIAZEM HCL 100 MG IV SOLR
5.0000 mg/h | INTRAVENOUS | Status: DC
  Administered 2015-10-31: 5 mg/h via INTRAVENOUS
  Filled 2015-10-31: qty 100

## 2015-10-31 MED ORDER — RENA-VITE PO TABS
1.0000 | ORAL_TABLET | Freq: Every day | ORAL | Status: DC
  Administered 2015-11-01 – 2015-11-10 (×10): 1 via ORAL
  Filled 2015-10-31 (×11): qty 1

## 2015-10-31 MED ORDER — LEVOTHYROXINE SODIUM 150 MCG PO TABS
175.0000 ug | ORAL_TABLET | Freq: Every day | ORAL | Status: DC
  Administered 2015-11-01 – 2015-11-10 (×10): 175 ug via ORAL
  Filled 2015-10-31 (×13): qty 1

## 2015-10-31 MED ORDER — SODIUM CHLORIDE 0.9 % IJ SOLN
3.0000 mL | Freq: Two times a day (BID) | INTRAMUSCULAR | Status: DC
  Administered 2015-10-31 – 2015-11-10 (×19): 3 mL via INTRAVENOUS

## 2015-10-31 MED ORDER — WARFARIN SODIUM 7.5 MG PO TABS: 7.5000 mg | ORAL_TABLET | ORAL | Status: DC

## 2015-10-31 MED ORDER — ROSUVASTATIN CALCIUM 20 MG PO TABS
20.0000 mg | ORAL_TABLET | Freq: Every day | ORAL | Status: DC
  Administered 2015-11-01 – 2015-11-03 (×3): 20 mg via ORAL
  Filled 2015-10-31 (×4): qty 1

## 2015-10-31 MED ORDER — PIPERACILLIN-TAZOBACTAM IN DEX 2-0.25 GM/50ML IV SOLN
2.2500 g | Freq: Three times a day (TID) | INTRAVENOUS | Status: DC
  Filled 2015-10-31: qty 50

## 2015-10-31 MED ORDER — SODIUM CHLORIDE 0.9 % IV SOLN
1.0000 g | Freq: Once | INTRAVENOUS | Status: AC
  Administered 2015-10-31: 1 g via INTRAVENOUS
  Filled 2015-10-31: qty 10

## 2015-10-31 MED ORDER — WARFARIN - PHARMACIST DOSING INPATIENT: Freq: Every day | Status: DC

## 2015-10-31 MED ORDER — VANCOMYCIN HCL IN DEXTROSE 750-5 MG/150ML-% IV SOLN
750.0000 mg | INTRAVENOUS | Status: DC
  Filled 2015-10-31: qty 150

## 2015-10-31 MED ORDER — CARVEDILOL 12.5 MG PO TABS: 12.5000 mg | ORAL_TABLET | Freq: Two times a day (BID) | ORAL | Status: DC

## 2015-10-31 MED ORDER — VITAMIN B-12 100 MCG PO TABS
500.0000 ug | ORAL_TABLET | Freq: Every day | ORAL | Status: DC
  Administered 2015-11-01 – 2015-11-10 (×10): 500 ug via ORAL
  Filled 2015-10-31 (×3): qty 5
  Filled 2015-10-31: qty 1
  Filled 2015-10-31 (×4): qty 5
  Filled 2015-10-31: qty 1
  Filled 2015-10-31 (×2): qty 5

## 2015-10-31 MED ORDER — DILTIAZEM HCL ER COATED BEADS 120 MG PO CP24
120.0000 mg | ORAL_CAPSULE | Freq: Every day | ORAL | Status: DC
  Filled 2015-10-31: qty 1

## 2015-10-31 MED ORDER — POTASSIUM CHLORIDE 10 MEQ/100ML IV SOLN
10.0000 meq | INTRAVENOUS | Status: DC
  Administered 2015-10-31: 10 meq via INTRAVENOUS
  Filled 2015-10-31 (×2): qty 100

## 2015-10-31 MED ORDER — DEXTROSE 50 % IV SOLN
25.0000 g | Freq: Once | INTRAVENOUS | Status: AC
  Administered 2015-10-31: 25 g via INTRAVENOUS
  Filled 2015-10-31: qty 50

## 2015-10-31 MED ORDER — WARFARIN SODIUM 5 MG PO TABS
5.0000 mg | ORAL_TABLET | ORAL | Status: DC
Start: 2015-10-31 — End: 2015-10-31
  Filled 2015-10-31: qty 1

## 2015-10-31 MED ORDER — DEXTROSE 10 % IV SOLN
INTRAVENOUS | Status: DC
  Administered 2015-10-31: 12:00:00 via INTRAVENOUS

## 2015-10-31 MED ORDER — DEXTROSE 50 % IV SOLN
INTRAVENOUS | Status: AC
Start: 2015-10-31 — End: 2015-10-31
  Administered 2015-10-31: 25 g via INTRAVENOUS
  Filled 2015-10-31: qty 50

## 2015-10-31 MED ORDER — ACETAMINOPHEN 325 MG PO TABS
650.0000 mg | ORAL_TABLET | Freq: Four times a day (QID) | ORAL | Status: DC | PRN
  Administered 2015-11-02 – 2015-11-04 (×2): 650 mg via ORAL
  Filled 2015-10-31 (×3): qty 2

## 2015-10-31 MED ORDER — INSULIN ASPART 100 UNIT/ML ~~LOC~~ SOLN
0.0000 [IU] | Freq: Three times a day (TID) | SUBCUTANEOUS | Status: DC
  Administered 2015-10-31 – 2015-11-01 (×3): 2 [IU] via SUBCUTANEOUS
  Administered 2015-11-02 – 2015-11-04 (×3): 1 [IU] via SUBCUTANEOUS
  Administered 2015-11-06: 2 [IU] via SUBCUTANEOUS
  Administered 2015-11-06: 1 [IU] via SUBCUTANEOUS
  Administered 2015-11-06: 2 [IU] via SUBCUTANEOUS
  Administered 2015-11-07: 1 [IU] via SUBCUTANEOUS
  Administered 2015-11-07: 2 [IU] via SUBCUTANEOUS
  Administered 2015-11-09: 1 [IU] via SUBCUTANEOUS
  Administered 2015-11-09: 2 [IU] via SUBCUTANEOUS
  Administered 2015-11-09: 1 [IU] via SUBCUTANEOUS
  Administered 2015-11-10 (×2): 3 [IU] via SUBCUTANEOUS

## 2015-10-31 MED ORDER — CALCIUM ACETATE (PHOS BINDER) 667 MG PO CAPS
667.0000 mg | ORAL_CAPSULE | Freq: Three times a day (TID) | ORAL | Status: DC
  Administered 2015-11-01 – 2015-11-10 (×19): 667 mg via ORAL
  Filled 2015-10-31 (×29): qty 1

## 2015-10-31 MED ORDER — NITROGLYCERIN 0.4 MG SL SUBL: 0.4000 mg | SUBLINGUAL_TABLET | SUBLINGUAL | Status: DC | PRN

## 2015-10-31 MED ORDER — POTASSIUM CHLORIDE CRYS ER 20 MEQ PO TBCR: 40.0000 meq | EXTENDED_RELEASE_TABLET | Freq: Once | ORAL | Status: DC

## 2015-10-31 MED ORDER — MEROPENEM 500 MG IV SOLR
500.0000 mg | Freq: Once | INTRAVENOUS | Status: AC
  Administered 2015-10-31: 500 mg via INTRAVENOUS
  Filled 2015-10-31: qty 0.5

## 2015-10-31 NOTE — ED Notes (Signed)
Dr. Julian ReilGardner at bedside, CCM on the way.

## 2015-10-31 NOTE — Progress Notes (Signed)
[place] TEAM 1 - Stepdown/ICU TEAM Progress Note  [name] [name]:[id number] DOB: Dec 02, 1935 DOA: 10/30/2015 PCP: [name], MD  Admit HPI / Brief Narrative: [name] [name] is a 80 y.o. BM PMHx CAD native artery, HTN, HLD, CHF, ESRD dialized M/W/F, hypothyroidism, diabetes type 2   Recent [facility] admission, discharged 1/9 on fortaz and vanc which he receives with dialysis, last dose 1/13 at dialysis. Patient brought in by family with confusion, decreased appetitie. Symptoms ongoing for past couple of days but significantly worse over past 24 hours. In ED he was febrile to 102.6. He was given zosyn, vanc, and IVF, and his mental status has significantly improved.  ROS is negative for cough or congestion, no pain at fistula which is maturing still after being put in back in Nov, nor pain at CVC site. Has chronic arthritis pain of the L knee.   HPI/Subjective: 1/15 A/O 3, (does not know why), follows commands but still confused. Patient admitted on 1/15  Assessment/Plan: Sepsis - -site unclear, DDX includes:line sepsis / bacteremia,Would like to get line pulled as soon as possible. -Would lead to Nephrology if fistula ready for use; agree with admitting team pulled As soon as possible.Fistula placed in November, and has thrill. Ready for use? -BCx pending vs septic arthritis of L knee. Less likely given lack of skin changes, warmth, redness of the knee -Has known chronic arthritis of this knee which has been treated for many months per family -Continue current antibiotics   Elevated troponin  -Trend troponins most likely demand ischemia     DM Type 2  -Sensitive scale SSI while inpatient  HTN - BP soft continue to hold all BP meds   ESRD HD M/W/F - left voicemail with nephrology for routine dialysis while inpatient   Code Status: FULL Family Communication: no family present at time of exam Disposition Plan: SNF?    Consultants:   Procedure/Significant  Events: 1/8 echocardiogram;- Left ventricle: mildly dilated. -LVEF= 40%-45%. Hypokinesis  anteroseptal myocardium. -Mitral valve: moderate regurgitation.- Left atrium: moderately dilated.- Right atrium: moderately dilated. - Tricuspid valve: severe regurgitation. - Pulmonary arteries:  PApeak pressure: 39 mm Hg (S). - Line: A venous catheter was visualized in the right atrium.   Culture 1/15 blood 2 pending 1/15 urine pending   Antibiotics: Meropenem 1/15>> Zosyn 1/151 dose Vancomycin 1/15>>   DVT prophylaxis: Lovenox   Devices    LINES / TUBES:      Continuous Infusions: . dextrose 50 mL/hr at 10/31/15 1159    Objective: VITAL SIGNS: Temp: 97.9 F (36.6 C) (01/15 1547) Temp Source: Oral (01/15 1547) BP: 108/66 mmHg (01/15 1600) Pulse Rate: 82 (01/15 1600) SPO2; FIO2:   Intake/Output Summary (Last 24 hours) at 10/31/15 1743 Last data filed at 10/31/15 1600  Gross per 24 hour  Intake 3660.83 ml  Output    150 ml  Net 3510.83 ml     Exam: General: A/O 3, (does not know why), follows commands but still confused., No acute respiratory distress Eyes: Negative headache,negative scleral hemorrhage ENT: Negative Runny nose, negative gingival bleeding, Neck:  Negative scars, masses, torticollis, lymphadenopathy, JVD Lungs: Clear to auscultation bilaterally without wheezes or crackles Cardiovascular: Regular rate and rhythm without murmur gallop or rub normal S1 and S2   Data Reviewed: Basic Metabolic Panel:  Recent Labs Lab 10/25/15 0753 10/30/15 2357 10/31/15 0345 10/31/15 0834 10/31/15 1108  NA 138 135 140 137 137  K 4.6 3.8 2.5* 4.3 4.6  CL 99* 93* 112*  100* 99*  CO2 25 25 17* 22 22  GLUCOSE 152* 74 104* 69 73  BUN 43* 41* 31* 44* 48*  CREATININE 6.68* 6.49* 4.28* 6.45* 6.78*  CALCIUM 8.9 9.3 5.8* 8.7* 8.8*  PHOS 4.1  --   --   --  5.8*   Liver Function Tests:  Recent Labs Lab 10/25/15 0753 10/30/15 2357 10/31/15 1108  AST  --   55*  --   ALT  --  8*  --   ALKPHOS  --  99  --   BILITOT  --  0.6  --   PROT  --  7.3  --   ALBUMIN 2.2* 2.3* 1.9*   No results for input(s): LIPASE, AMYLASE in the last 168 hours. No results for input(s): AMMONIA in the last 168 hours. CBC:  Recent Labs Lab 10/25/15 0753 10/30/15 2357 10/31/15 0345  WBC 4.8 6.8 6.3  NEUTROABS 3.2 4.4  --   HGB 10.9* 12.0* 8.5*  HCT 35.6* 38.6* 28.5*  MCV 96.0 95.3 95.3  PLT 121* 165 97*   Cardiac Enzymes:  Recent Labs Lab 10/31/15 0345 10/31/15 0834 10/31/15 1410  TROPONINI 0.46* 0.58* 0.56*   BNP (last 3 results)  Recent Labs  12/10/14 0530 08/29/15 1235 09/03/15 1356  BNP 326.5* 1483.1* 1191.8*    ProBNP (last 3 results) No results for input(s): PROBNP in the last 8760 hours.  CBG:  Recent Labs Lab 10/30/15 2313 10/31/15 0918 10/31/15 1132 10/31/15 1216 10/31/15 1546  GLUCAP 73 72 64* 155* 175*    Recent Results (from the past 240 hour(s))  MRSA PCR Screening     Status: None   Collection Time: 10/23/15  7:06 PM  Result Value Ref Range Status   MRSA by PCR NEGATIVE NEGATIVE Final    Comment:        The GeneXpert MRSA Assay (FDA approved for NASAL specimens only), is one component of a comprehensive MRSA colonization surveillance program. It is not intended to diagnose MRSA infection nor to guide or monitor treatment for MRSA infections.      Studies:  Recent x-ray studies have been reviewed in detail by the Attending Physician  Scheduled Meds:  Scheduled Meds: . calcium acetate  667 mg Oral TID WC  . vitamin B-12  500 mcg Oral Daily  . insulin aspart  0-9 Units Subcutaneous TID WC  . levothyroxine  175 mcg Oral QAC breakfast  . [START ON 11/01/2015] meropenem (MERREM) IV  500 mg Intravenous Q24H  . multivitamin  1 tablet Oral Daily  . rosuvastatin  20 mg Oral Daily  . sodium chloride  3 mL Intravenous Q12H  . [START ON 11/01/2015] vancomycin  750 mg Intravenous Q M,W,F-HD    Time spent on  care of this patient: 40 mins   Bailie Christenbury, Roselind MessierURTIS J , MD  [facility] Office  [phone] Pager - [phone]  On-Call/Text Page:      [email or web address]      password [email or web address]  If 7PM-7AM, please contact night-coverage [email or web address] Password [email or web address] 10/31/2015, 5:43 PM   LOS: 0 days   Care during the described time interval was provided by me .  I have reviewed this patient's available data, including medical history, events of note, physical examination, and all test results as part of my evaluation. I have personally reviewed and interpreted all radiology studies.   Carolyne Littlesurtis Hermelinda Diegel, MD [phone] Pager

## 2015-10-31 NOTE — Progress Notes (Addendum)
A.Fib now in RVR 130s-140s.  Patient mental status also declined, still answering questions though.  Cardizem gtt ordered Keep an eye on temperature, suspect patient may have rigors right now and may be spiking a fever again. If so will need repeat of PR tylenol. UA noted to be very positive with Nitrites.  Patient was already on fortaz and vanc as outpatient before developing sepsis.  Got zosyn in ED, [facility] empiric coverage of possible ESBLs given that what ever is causing the UTI seems to be resistant to fortaz / vanc.  Probably a GN given the positive nitrites but dont feel confident enough with his history of MRSA to DC vanc given that he is worsening clinically.

## 2015-10-31 NOTE — Progress Notes (Signed)
PULMONARY / CRITICAL CARE MEDICINE HISTORY AND PHYSICAL EXAMINATION   Name: [name] [name] MRN: [id number] DOB: 11-29-1935    ADMISSION DATE:  10/30/2015  PRIMARY SERVICE: PCCM  CHIEF COMPLAINT:  Confusion  BRIEF PATIENT DESCRIPTION: 80 y/o man CHF/ESRD recently admitted for sepsis/HCAP still finishing course of ABX who presents with sepsis, apparent urinary source.  SIGNIFICANT EVENTS / STUDIES:  AFib w/ RVR, placed briefly on Cardizem gtt  LINES / TUBES: R [place] tunneled HD cath PIV  CULTURES: Urine 1/15>> Blood 1/15 x2>>  ANTIBIOTICS: Vanc (course started w/ prior admission, given MWF w/ dialysis) 1/5>> Ceftaz (course started w/ prior admission, given MWF w/ dialysis) 1/11>>1/13  HISTORY OF PRESENT ILLNESS:   80 y/o man with hx of afib, CHF as well as ESRD, started on HD in November of '16 and recent admission for sepsis, thought to be HCAP who presented to the ED with several days of worsening confusion / poor appetite. In the ED he was found to be febrile to 102, and was restarted on antibiotics. He developed RVR and was placed on a Cardizem infusion which did control his rate, but he became hypotensive and PCCM was called for admission. Apparently his mental status did improve somewhat.  PAST MEDICAL HISTORY :  Past Medical History  Diagnosis Date  . CAD (coronary artery disease)     a. s/p PCI to mid and mid-distal RCA, 40% sten L main, no sig dz LCx  b. myoview 11/24/2014 inferoapex ischemia, medical management due to high risk for contrast nephropathy  . HTN (hypertension)   . Hyperlipidemia   . Cerebrovascular accident (HCC)   . CHF (congestive heart failure) (HCC)     EF 35% echo 2012 ; EF 44% myoview 2012; EF 76% by [facility] November 2014  . Diabetes mellitus   . 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 Unspecified hypothyroidism   . Gout   . Respiratory failure, acute (HCC) 07/21/2015   Past Surgical History  Procedure Laterality Date  . Thyroidectomy     . Av fistula placement Left 09/10/2015    Procedure: BRACHIOCEPHALIC ARTERIOVENOUS (AV) FISTULA CREATION;  Surgeon: Nada Libman, MD;  Location: [facility] OR;  Service: Vascular;  Laterality: Left;   Prior to Admission medications   Medication Sig Start Date End Date Taking? Authorizing Provider  calcium acetate (PHOSLO) 667 MG capsule Take 1 capsule (667 mg total) by mouth 3 (three) times daily with meals. 09/30/15  Yes [name], MD  carvedilol (COREG) 12.5 MG tablet Take 1 tablet (12.5 mg total) by mouth 2 (two) times daily with a meal. 01/11/15  Yes [name] Patty, MD  cefTAZidime 2 g in dextrose 5 % 50 mL Inject 2 g into the vein every Monday, Wednesday, and Friday at 6 PM. Last dose on 10/29/15 10/25/15  Yes Catarina Hartshorn, MD  diltiazem (CARDIZEM CD) 120 MG 24 hr capsule Take 1 capsule (120 mg total) by mouth daily. 10/25/15  Yes Catarina Hartshorn, MD  glipiZIDE (GLUCOTROL) 5 MG tablet Take 2.5-5 mg by mouth 2 (two) times daily before a meal. Take 1/2 tablet (2.5 mg) daily before breakfast and 1 tablet (5 mg) before supper 05/01/15  Yes Historical Provider, MD  levothyroxine (SYNTHROID, LEVOTHROID) 175 MCG tablet Take 1 tablet (175 mcg total) by mouth daily. 08/13/15  Yes [name], MD  multivitamin (RENA-VIT) TABS tablet Take 1 tablet by mouth daily.   Yes Historical Provider, MD  nitroGLYCERIN (NITROSTAT) 0.4  MG SL tablet Place 1 tablet (0.4 mg total) under the tongue every 5 (five) minutes as needed for chest pain. 11/25/14 04/25/17 Yes Azalee Course, PA  olopatadine (PATANOL) 0.1 % ophthalmic solution Place 1 drop into both eyes 2 (two) times daily. Patient taking differently: Place 1 drop into both eyes daily.  04/20/15  Yes [name], MD  rosuvastatin (CRESTOR) 20 MG tablet Take 1 tablet (20 mg total) by mouth daily. 10/07/15  Yes [name], MD  Vancomycin (VANCOCIN) 750 MG/150ML SOLN Inject 150 mLs (750 mg total) into the vein every Monday, Wednesday, and Friday with hemodialysis.  Last dose on 11/01/15 10/25/15  Yes Catarina Hartshorn, MD  vitamin B-12 500 MCG tablet Take 1 tablet (500 mcg total) by mouth daily. 10/25/15  Yes Catarina Hartshorn, MD  warfarin (COUMADIN) 5 MG tablet Take 5 mg by mouth daily. 10/12/15  Yes Historical Provider, MD  warfarin (COUMADIN) 1 MG tablet Take 1 tablet by mouth daily or as directed by coumadin clinic Patient not taking: Reported on 10/31/2015 10/25/15   Catarina Hartshorn, MD   Allergies  Allergen Reactions  . Tomato Other (See Comments)    Too much acid - cannot take due to kidney issues    FAMILY HISTORY:  Family History  Problem Relation Age of Onset  . Kidney failure Mother   . Diabetes Mother   . Cancer Father     lung  . Heart attack Brother     massive heart attack in 60s  . Hypertension Mother    SOCIAL HISTORY:  reports that he quit smoking about 62 years ago. His smoking use included Cigarettes. He smoked 0.50 packs per day. He does not have any smokeless tobacco history on file. He reports that he does not drink alcohol or use illicit drugs.  REVIEW OF SYSTEMS:  Patient is too sleepy to cooperate with history.  SUBJECTIVE: Patient is somnolent, but awakens to voice.  VITAL SIGNS: Temp:  [97.7 F (36.5 C)-102.6 F (39.2 C)] 97.7 F (36.5 C) (01/15 0331) Pulse Rate:  [75-106] 80 (01/15 0645) Resp:  [12-32] 23 (01/15 0645) BP: (81-161)/(42-136) 93/50 mmHg (01/15 0645) SpO2:  [92 %-100 %] 97 % (01/15 0645) Weight:  [158 lb (71.668 kg)] 158 lb (71.668 kg) (01/14 2320) HEMODYNAMICS: MAP of ~65   VENTILATOR SETTINGS:   INTAKE / OUTPUT: Intake/Output      01/14 0701 - 01/15 0700   I.V. (mL/kg) 3460 (48.3)   Total Intake(mL/kg) 3460 (48.3)   Urine (mL/kg/hr) 150   Total Output 150   Net +3310         PHYSICAL EXAMINATION: General:  Elderly man, sleeping. Neuro:  Somnolent, mildly but pleasantly confused HEENT:  MMM Neck: Mildly elevated JVP, no LAD Cardiovascular:  Irregular HR, normal rate Lungs:  Clear to  auscultation Abdomen:  Obese, soft Musculoskeletal:  No swollen joints Skin:  No rashes  LABS:  CBC  Recent Labs Lab 10/25/15 0753 10/30/15 2357 10/31/15 0345  WBC 4.8 6.8 6.3  HGB 10.9* 12.0* 8.5*  HCT 35.6* 38.6* 28.5*  PLT 121* 165 97*   Coag's  Recent Labs Lab 10/26/15 10/30/15 2357 10/31/15 0345  INR 3.1 2.64* 4.07*   BMET  Recent Labs Lab 10/25/15 0753 10/30/15 2357 10/31/15 0345  NA 138 135 140  K 4.6 3.8 2.5*  CL 99* 93* 112*  CO2 25 25 17*  BUN 43* 41* 31*  CREATININE 6.68* 6.49* 4.28*  GLUCOSE 152* 74 104*   Electrolytes  Recent Labs Lab 10/25/15 0753 10/30/15 2357 10/31/15 0345  CALCIUM 8.9 9.3 5.8*  PHOS 4.1  --   --    Sepsis Markers  Recent Labs Lab 10/30/15 2357 10/31/15 0352  LATICACIDVEN 1.91 1.43   ABG  Recent Labs Lab 10/31/15 0007  PHART 7.517*  PCO2ART 35.7  PO2ART 374.0*   Liver Enzymes  Recent Labs Lab 10/25/15 0753 10/30/15 2357  AST  --  55*  ALT  --  8*  ALKPHOS  --  99  BILITOT  --  0.6  ALBUMIN 2.2* 2.3*   Cardiac Enzymes  Recent Labs Lab 10/31/15 0345  TROPONINI 0.46*   Glucose  Recent Labs Lab 10/24/15 1726 10/30/15 2313  GLUCAP 165* 73    Imaging Dg Chest Port 1 View  10/30/2015  CLINICAL DATA:  Acute onset of shortness of breath. Initial encounter. EXAM: PORTABLE CHEST 1 VIEW COMPARISON:  Chest radiograph performed 10/24/2015 FINDINGS: The patient's right-sided dual-lumen catheter is noted ending overlying the right atrium. The lungs are well-aerated. Vascular congestion is noted. Increased interstitial markings raise concern for mild interstitial edema. A small left pleural effusion may be present. No pneumothorax is seen. The cardiomediastinal silhouette is mildly enlarged. No acute osseous abnormalities are seen. IMPRESSION: Vascular congestion and mild cardiomegaly. Increased interstitial markings raise concern for mild interstitial edema. Small left pleural effusion may be present.  Electronically Signed   By: [name] M.D.   On: 10/30/2015 23:54    EKG: Afib CXR: Improved compared to prior  ASSESSMENT / PLAN:  Principal Problem:   Sepsis (HCC) Active Problems:   Type II diabetes mellitus with renal manifestations (HCC)   Essential hypertension   ESRD on dialysis (HCC)   PULMONARY A: No active issues P:   At risk for pulmonary edema given ESRD/CHF and 3L fluid challenge, but doing OK presently  CARDIOVASCULAR A: Afib, bout of RVR Hypotension CHF, EF ~40% Troponinemia in setting of known CAD P:   - Hold all antihypertensives for now - coumadin per pharmacy recs - Suspect cardizem is contributing - Given ESRD/CHF, will be hold on further volume challenge. - Trop of 0.46 is likely demand, but will trend.  RENAL A: ESRD on HD Non-gap metabolic acidosis (hyperchloremic) Hypokalemia P:   Hold on further IVF for now, renal already consulted. Expect BP to improve as cardizem is metabolized.  GASTROINTESTINAL A: No active issues P:    HEMATOLOGIC A: Supertheraputic INR P:   Jump from 2.6 to 4.0 in a few hours is concerning, no other signs of hepatic dysfunction.  INFECTIOUS A: Severe Sepsis, favor urinary source P:   Continue mero / vanc. Concern for resistant organism (such as ESBL) as patent developed sepsis while on vanc and ceftaz.  ENDOCRINE A: No active issues P:   SSI insulin  NEUROLOGIC A: Metabolic Encephelopathy P:   Will see how responds to therapy  BEST PRACTICE / DISPOSITION Level of Care:  ICU Primary Service:  PCCM Consultants:  Nephro Code Status:  Full Diet:  Renal DVT Px:  Heparin GI Px:  None Skin Integrity:  Intact Social / Family:  Updated on admission  TODAY'S SUMMARY: 80 y/o man with severe sepsis, likely of urinary origin that developed while on ceftaz/vanc  I have personally obtained a history, examined the patient, evaluated laboratory and imaging results, formulated the assessment and  plan and placed orders.  CRITICAL CARE: The patient is critically ill with multiple organ systems failure and requires high complexity decision making for  assessment and support, frequent evaluation and titration of therapies, application of advanced monitoring technologies and extensive interpretation of multiple databases. Critical Care Time devoted to patient care services described in this note is 75 minutes.   Jamie KatoAaron Shai Rasmussen, MD Pulmonary and Critical Care Medicine [facility] Pager: [phone]   10/31/2015, 6:53 AM

## 2015-10-31 NOTE — ED Provider Notes (Signed)
EKG Interpretation  Date/Time:  Sunday October 31 2015 02:52:18 EST Ventricular Rate:  135 PR Interval:    QRS Duration: 105 QT Interval:  342 QTC Calculation: 513 R Axis:   108 Text Interpretation:  Atrial fibrillation Right axis deviation ST depression, probably rate related Prolonged QT interval Artifact in lead(s) I aVR aVL Confirmed by Bebe ShaggyWICKLINE  MD, Dorinda HillNALD ([id number]) on 10/31/2015 2:59:10 AM      Pt awaiting admission in the ED While waiting a bed, he began to worsen He is now hypotensive, MAPs in the 60s He is still responsive/awake/alert He did have episodes of afib with RVR earlier but this resolved He has received appropriate IV fluids I have discussed the case at length/multiple times with Dr Julian ReilGardner He has consulted critical care due to hypotension and they will evaluate in the ED Pt is receiving 3rd liter of normal saline BP 87/54 mmHg  Pulse 91  Temp(Src) 97.7 F (36.5 C) (Oral)  Resp 28  Ht 5\' 9"  (1.753 m)  Wt 71.668 kg  BMI 23.32 kg/m2  SpO2 94%  CRITICAL CARE Performed by: Joya GaskinsWICKLINE,Kazuko Clemence W Total critical care time: 40 minutes Critical care time was exclusive of separately billable procedures and treating other patients. Critical care was necessary to treat or prevent imminent or life-threatening deterioration. Critical care was time spent personally by me on the following activities: development of treatment plan with patient and/or surrogate as well as nursing, discussions with consultants, evaluation of patient's response to treatment, examination of patient, obtaining history from patient or surrogate, ordering and performing treatments and interventions, ordering and review of laboratory studies, ordering and review of radiographic studies, pulse oximetry and re-evaluation of patient's condition.   Zadie Rhineonald Cree Kunert, MD 10/31/15 [phone]

## 2015-10-31 NOTE — Progress Notes (Signed)
BS recheck 155

## 2015-10-31 NOTE — Progress Notes (Addendum)
CRITICAL VALUE ALERT  Critical value received:  Troponin 0.58  Date of notification:  10/31/15  Time of notification:  0930  Critical value read back: yes  Nurse who received alert:  Cher NakaiLaTanya Briann Sarchet   MD notified (1st page):  Dr. Dimple Caseyice  Time of first page:  0935  MD notified (2nd page):  Time of second page:  Responding MD:  Dr. Dimple Caseyice   Time MD responded:  [phone]

## 2015-10-31 NOTE — Progress Notes (Addendum)
Called with AM labs: Replacing potassium and calcium, serial trops ordered and repeat BMP at 0800 ordered.  Trop of 0.47 noted, suspect this was demand ischemia from the A.Fib RVR that he developed throughout the night.  Serial trops ordered, already on cardizem gtt for rate control.  Heparin is not ordered as patient is already supratheraputic on coumadin with INR of 4 this morning.

## 2015-10-31 NOTE — ED Notes (Signed)
Hospitalist at the bedside 

## 2015-10-31 NOTE — Progress Notes (Signed)
Patient has decubitus ulcer on left buttock present on admission. It has a foam dressing over it.

## 2015-10-31 NOTE — ED Notes (Signed)
PCCN MD at the bedside.

## 2015-10-31 NOTE — Progress Notes (Signed)
Utilization review completed.  

## 2015-10-31 NOTE — Progress Notes (Addendum)
Patient BP going down hill: IVF wide open due to SBP of 81 (2.5L in so far and this 500cc that is going is L number 3), and I am calling PCCM.  Spoke with Dr. Katrinka BlazingSmith, PCCM is coming to take a look at patient.

## 2015-10-31 NOTE — Progress Notes (Signed)
Patient had hypogylcemic event with bs of 64. Dextrose 50% injection given. Will recheck bs in 5 minutes per protocol.

## 2015-10-31 NOTE — Progress Notes (Signed)
Pharmacy note: lovenox  80 yo male here with severe sepsis. Pharmacy was consulted to dose lovenox for VTE prophylaxis. -INR= 4.07 (on coumadin PTA for afib)  Plan -No lovenox now with current INR -Will follow anticoagulation plans\  Harland Germanndrew Kerilyn Cortner, Pharm D 10/31/2015 7:13 PM

## 2015-10-31 NOTE — Evaluation (Signed)
Clinical/Bedside Swallow Evaluation Patient Details  Name: [name] [name] MRN: [id number] Date of Birth: 05/04/1936  Today's Date: 10/31/2015 Time: SLP Start Time (ACUTE ONLY): 1235 SLP Stop Time (ACUTE ONLY): 1245 SLP Time Calculation (min) (ACUTE ONLY): 10 min  Past Medical History:  Past Medical History  Diagnosis Date  . CAD (coronary artery disease)     a. s/p PCI to mid and mid-distal RCA, 40% sten L main, no sig dz LCx  b. myoview 11/24/2014 inferoapex ischemia, medical management due to high risk for contrast nephropathy  . HTN (hypertension)   . Hyperlipidemia   . Cerebrovascular accident (HCC)   . CHF (congestive heart failure) (HCC)     EF 35% echo 2012 ; EF 44% myoview 2012; EF 76% by [facility] November 2014  . Diabetes mellitus   . 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 Unspecified hypothyroidism   . Gout   . Respiratory failure, acute (HCC) 07/21/2015   Past Surgical History:  Past Surgical History  Procedure Laterality Date  . Thyroidectomy    . Av fistula placement Left 09/10/2015    Procedure: BRACHIOCEPHALIC ARTERIOVENOUS (AV) FISTULA CREATION;  Surgeon: Nada Libman, MD;  Location: [facility] OR;  Service: Vascular;  Laterality: Left;   HPI:  80 y/o man with hx of afib, CHF as well as ESRD, started on HD in November of '16 and recent admission for sepsis, thought to be HCAP who presented to the ED with several days of worsening confusion / poor appetite. In the ED he was found to be febrile to 102, and was restarted on antibiotics. Suspected urinary source. CT head clear, CXR shows Vascular congestion and mild cardiomegaly. Stroke swallow was done and pt failed du eto history of dysphagia though pt has been seen twice here for bedside and been found to have a normal swallow.    Assessment / Plan / Recommendation Clinical Impression  Pt demonstrates swallow function WNL, consistent will all prior assessment. Pt does not have a history of dysphagia. May initiate a  regular texture diet, no SLP f/u needed. Will sign off.     Aspiration Risk  Mild aspiration risk    Diet Recommendation Regular;Thin liquid   Liquid Administration via: Cup;Straw Medication Administration: Whole meds with liquid Supervision: Intermittent supervision to cue for compensatory strategies    Other  Recommendations     Follow up Recommendations       Frequency and Duration            Prognosis        Swallow Study   General HPI: 80 y/o man with hx of afib, CHF as well as ESRD, started on HD in November of '16 and recent admission for sepsis, thought to be HCAP who presented to the ED with several days of worsening confusion / poor appetite. In the ED he was found to be febrile to 102, and was restarted on antibiotics. Suspected urinary source. CT head clear, CXR shows Vascular congestion and mild cardiomegaly. Stroke swallow was done and pt failed du eto history of dysphagia though pt has been seen twice here for bedside and been found to have a normal swallow.  Type of Study: Bedside Swallow Evaluation Previous Swallow Assessment: multiple BSE, all WNL Diet Prior to this Study: NPO Temperature Spikes Noted: No Respiratory Status: Room air History of Recent Intubation: No Behavior/Cognition: Alert;Cooperative;Pleasant mood Oral Cavity Assessment: Within Functional Limits Oral Care Completed by SLP: No Oral Cavity -  Dentition: Edentulous Vision: Functional for self-feeding Self-Feeding Abilities: Able to feed self Patient Positioning: Upright in bed Baseline Vocal Quality: Normal Volitional Cough: Strong Volitional Swallow: Able to elicit    Oral/Motor/Sensory Function Overall Oral Motor/Sensory Function: Within functional limits   Ice Chips     Thin Liquid Thin Liquid: Within functional limits    Nectar Thick Nectar Thick Liquid: Not tested   Honey Thick Honey Thick Liquid: Not tested   Puree Puree: Within functional limits   Solid   GO   Solid:  Within functional limits       Parkview Huntington HospitalBonnie Tige Meas, MA CCC-SLP [phone]  Arlis Yale, Riley NearingBonnie Caroline 10/31/2015,1:45 PM

## 2015-10-31 NOTE — Progress Notes (Signed)
Patient admitted to unit. He is alert and oriented, Bp 111/64, HR 94, and 98% on 4L [place].

## 2015-10-31 NOTE — Progress Notes (Addendum)
ANTICOAGULATION/ANTIBIOTIC CONSULT NOTE - Initial Consult  Pharmacy Consult for Coumadin and Vancocin/Merrem Indication: atrial fibrillation and r/o sepsis  Allergies  Allergen Reactions  . Tomato Other (See Comments)    Too much acid - cannot take due to kidney issues    Patient Measurements: Height: 5\' 9"  (175.3 cm) Weight: 158 lb (71.668 kg) IBW/kg (Calculated) : 70.7  Vital Signs: Temp: 102.6 F (39.2 C) (01/14 2320) Temp Source: Rectal (01/14 2320) BP: 110/58 mmHg (01/15 0115) Pulse Rate: 91 (01/15 0115)  Labs:  Recent Labs  10/30/15 2357  HGB 12.0*  HCT 38.6*  PLT 165  LABPROT 27.8*  INR 2.64*  CREATININE 6.49*    Estimated Creatinine Clearance: 9.5 mL/min (by C-G formula based on Cr of 6.49).   Medical History: Past Medical History  Diagnosis Date  . CAD (coronary artery disease)     a. s/p PCI to mid and mid-distal RCA, 40% sten L main, no sig dz LCx  b. myoview 11/24/2014 inferoapex ischemia, medical management due to high risk for contrast nephropathy  . HTN (hypertension)   . Hyperlipidemia   . Cerebrovascular accident (HCC)   . CHF (congestive heart failure) (HCC)     EF 35% echo 2012 ; EF 44% [facility]iew 2012; [facility]  . Diabetes mellitus   . 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 Unspecified hypothyroidism   . Gout   . Respiratory failure, acute (HCC) 07/21/2015     Assessment: 80yo male c/o increased confusion and decreased ambulation, family believes HD port is infected, concern for sepsis, to begin IV ABX and continue Coumadin for Afib; current INR within goal w/ last Coumadin dose taken 1/14.  Goal of Therapy:  INR 2-3 Pre-HD vanc level 15-25   Plan:  Rec'd vanc 1500mg  and Zosyn 3.375g IV in ED; will continue with vancomycin 750mg  IV after each HD as well as Zosyn 2.25g IV Q8H and monitor CBC and Cx.  Will continue home Coumadin dose of 5mg  TWTSS and 7.5mg  MF and monitor INR.  Vernard GamblesVeronda Cartha Rotert, PharmD, BCPS   10/31/2015,1:54 AM   ADDENDUM: Now to change Zosyn to Merrem 2/2 developed UTI while on Fortaz.  Will start Merrem 500mg  IV Q24H and monitor CBC and Cx. Vernard GamblesVeronda Daran Favaro, PharmD, BCPS 10/31/2015 3:17 AM

## 2015-10-31 NOTE — ED Notes (Signed)
Updated daughter about change on POC pt to be admitted to ICU now.

## 2015-10-31 NOTE — H&P (Signed)
[facility] History and Physical  [name] ConradRoy L Mustapha NFA:[id number] DOB: 02/04/1936 DOA: 10/30/2015  Referring physician: EDP PCP: Tana ConchStephen Hunter, MD   Chief Complaint: AMS   HPI: [name] [name] is a 80 y.o. male with pmhx of ESRD dialized MWF, recent HCAP admission, discharged 1/9 on fortaz and vanc which he receives with dialysis, last dose 1/13 at dialysis.  Patient brought in by family with confusion, decreased appetitie.  Symptoms ongoing for past couple of days but significantly worse over past 24 hours.  In ED he was febrile to 102.6.  He was given zosyn, vanc, and IVF, and his mental status has significantly improved.  ROS is negative for cough or congestion, no pain at fistula which is maturing still after being put in back in Nov, nor pain at CVC site.  Has chronic arthritis pain of the L knee.  Review of Systems: Systems reviewed.  As above, otherwise negative  Past Medical History  Diagnosis Date  . CAD (coronary artery disease)     a. s/p PCI to mid and mid-distal RCA, 40% sten L main, no sig dz LCx  b. myoview 11/24/2014 inferoapex ischemia, medical management due to high risk for contrast nephropathy  . HTN (hypertension)   . Hyperlipidemia   . Cerebrovascular accident (HCC)   . CHF (congestive heart failure) (HCC)     EF 35% echo 2012 ; EF 44% myoview 2012; [facility]  . Diabetes mellitus   . 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 Unspecified hypothyroidism   . Gout   . Respiratory failure, acute (HCC) 07/21/2015   Past Surgical History  Procedure Laterality Date  . Thyroidectomy    . Av fistula placement Left 09/10/2015    Procedure: BRACHIOCEPHALIC ARTERIOVENOUS (AV) FISTULA CREATION;  Surgeon: Nada LibmanVance W Brabham, MD;  Location: [facility] OR;  Service: Vascular;  Laterality: Left;   Social History:  reports that he quit smoking about 62 years ago. His smoking use included Cigarettes. He smoked 0.50 packs per day. He does not have any  smokeless tobacco history on file. He reports that he does not drink alcohol or use illicit drugs.  Allergies  Allergen Reactions  . Tomato Other (See Comments)    Too much acid - cannot take due to kidney issues    Family History  Problem Relation Age of Onset  . Kidney failure Mother   . Diabetes Mother   . Cancer Father     lung  . Heart attack Brother     massive heart attack in 1650s  . Hypertension Mother      Prior to Admission medications   Medication Sig Start Date End Date Taking? Authorizing Provider  calcium acetate (PHOSLO) 667 MG capsule Take 1 capsule (667 mg total) by mouth 3 (three) times daily with meals. 09/30/15  Yes Shelva MajesticStephen O Hunter, MD  carvedilol (COREG) 12.5 MG tablet Take 1 tablet (12.5 mg total) by mouth 2 (two) times daily with a meal. 01/11/15  Yes [name], MD  cefTAZidime 2 g in dextrose 5 % 50 mL Inject 2 g into the vein every Monday, Wednesday, and Friday at 6 PM. Last dose on 10/29/15 10/25/15  Yes [name], MD  diltiazem (CARDIZEM CD) 120 MG 24 hr capsule Take 1 capsule (120 mg total) by mouth daily. 10/25/15  Yes [name], MD  glipiZIDE (GLUCOTROL) 5 MG tablet Take 2.5-5 mg by mouth 2 (two) times daily before a  meal. Take 1/2 tablet (2.5 mg) daily before breakfast and 1 tablet (5 mg) before supper 05/01/15  Yes Historical Provider, MD  levothyroxine (SYNTHROID, LEVOTHROID) 175 MCG tablet Take 1 tablet (175 mcg total) by mouth daily. 08/13/15  Yes [name], MD  multivitamin (RENA-VIT) TABS tablet Take 1 tablet by mouth daily.   Yes Historical Provider, MD  nitroGLYCERIN (NITROSTAT) 0.4 MG SL tablet Place 1 tablet (0.4 mg total) under the tongue every 5 (five) minutes as needed for chest pain. 11/25/14 04/25/17 Yes Azalee Course, PA  olopatadine (PATANOL) 0.1 % ophthalmic solution Place 1 drop into both eyes 2 (two) times daily. Patient taking differently: Place 1 drop into both eyes daily.  04/20/15  Yes [name], MD  rosuvastatin (CRESTOR)  20 MG tablet Take 1 tablet (20 mg total) by mouth daily. 10/07/15  Yes [name], MD  Vancomycin (VANCOCIN) 750 MG/150ML SOLN Inject 150 mLs (750 mg total) into the vein every Monday, Wednesday, and Friday with hemodialysis. Last dose on 11/01/15 10/25/15  Yes Catarina Hartshorn, MD  vitamin B-12 500 MCG tablet Take 1 tablet (500 mcg total) by mouth daily. 10/25/15  Yes Catarina Hartshorn, MD  warfarin (COUMADIN) 5 MG tablet Take 5 mg by mouth daily. 10/12/15  Yes Historical Provider, MD  warfarin (COUMADIN) 1 MG tablet Take 1 tablet by mouth daily or as directed by coumadin clinic Patient not taking: Reported on 10/31/2015 10/25/15   Catarina Hartshorn, MD   Physical Exam: Filed Vitals:   10/31/15 0145 10/31/15 0200  BP: 108/65 109/66  Pulse: 83   Temp:    Resp: 16 20    BP 109/66 mmHg  Pulse 83  Temp(Src) 102.6 F (39.2 C) (Rectal)  Resp 20  Ht 5\' 9"  (1.753 m)  Wt 71.668 kg (158 lb)  BMI 23.32 kg/m2  SpO2 100%  General Appearance:    Alert, oriented, no distress, appears stated age  Head:    Normocephalic, atraumatic  Eyes:    PERRL, EOMI, sclera non-icteric        Nose:   Nares without drainage or epistaxis. Mucosa, turbinates normal  Throat:   Moist mucous membranes. Oropharynx without erythema or exudate.  Neck:   Supple. No carotid bruits.  No thyromegaly.  No lymphadenopathy.   Back:     No CVA tenderness, no spinal tenderness  Lungs:     Clear to auscultation bilaterally, without wheezes, rhonchi or rales  Chest wall:    No tenderness to palpitation  Heart:    Regular rate and rhythm without murmurs, gallops, rubs  Abdomen:     Soft, non-tender, nondistended, normal bowel sounds, no organomegaly  Genitalia:    deferred  Rectal:    deferred  Extremities:   No clubbing, cyanosis or edema., Pain at the L knee, but no erythema, no edema, no diabetic foot ulcers on either foot.  Pulses:   2+ and symmetric all extremities  Skin:   Skin color, texture, turgor normal, no rashes or lesions  Lymph  nodes:   Cervical, supraclavicular, and axillary nodes normal  Neurologic:   CNII-XII intact. Normal strength, sensation and reflexes      throughout    Labs on Admission:  Basic Metabolic Panel:  Recent Labs Lab 10/24/15 0411 10/25/15 0753 10/30/15 2357  NA 137 138 135  K 4.6 4.6 3.8  CL 100* 99* 93*  CO2 24 25 25   GLUCOSE 130* 152* 74  BUN 27* 43* 41*  CREATININE 4.62* 6.68* 6.49*  CALCIUM 8.8* 8.9 9.3  PHOS  --  4.1  --    Liver Function Tests:  Recent Labs Lab 10/25/15 0753 10/30/15 2357  AST  --  55*  ALT  --  8*  ALKPHOS  --  99  BILITOT  --  0.6  PROT  --  7.3  ALBUMIN 2.2* 2.3*   No results for input(s): LIPASE, AMYLASE in the last 168 hours. No results for input(s): AMMONIA in the last 168 hours. CBC:  Recent Labs Lab 10/24/15 0411 10/25/15 0753 10/30/15 2357  WBC 4.4 4.8 6.8  NEUTROABS  --  3.2 4.4  HGB 12.2* 10.9* 12.0*  HCT 39.8 35.6* 38.6*  MCV 98.0 96.0 95.3  PLT 144* 121* 165   Cardiac Enzymes: No results for input(s): CKTOTAL, CKMB, CKMBINDEX, TROPONINI in the last 168 hours.  BNP (last 3 results) No results for input(s): PROBNP in the last 8760 hours. CBG:  Recent Labs Lab 10/24/15 1726 10/30/15 2313  GLUCAP 165* 73    Radiological Exams on Admission: Dg Chest Port 1 View  10/30/2015  CLINICAL DATA:  Acute onset of shortness of breath. Initial encounter. EXAM: PORTABLE CHEST 1 VIEW COMPARISON:  Chest radiograph performed 10/24/2015 FINDINGS: The patient's right-sided dual-lumen catheter is noted ending overlying the right atrium. The lungs are well-aerated. Vascular congestion is noted. Increased interstitial markings raise concern for mild interstitial edema. A small left pleural effusion may be present. No pneumothorax is seen. The cardiomediastinal silhouette is mildly enlarged. No acute osseous abnormalities are seen. IMPRESSION: Vascular congestion and mild cardiomegaly. Increased interstitial markings raise concern for mild  interstitial edema. Small left pleural effusion may be present. Electronically Signed   By: [name] M.D.   On: 10/30/2015 23:54    EKG: Independently reviewed.  Assessment/Plan Principal Problem:   Sepsis (HCC) Active Problems:   Type II diabetes mellitus with renal manifestations (HCC)   Essential hypertension   ESRD on dialysis (HCC)   1. Sepsis - 1. site unclear, DDX includes: 1. line sepsis / bacteremia 1. Would like to get line pulled as soon as possible, day team should ask vascular surgery if his fistula is ready for use (was put in back in November, and has thrill) 2. BCx pending 2. vs septic arthritis of L knee 1. Less likely given lack of skin changes, warmth, redness of the knee 2. Has known chronic arthritis of this knee which has been treated for many months per family 3. Vs other 2. Empiric zosyn and vanc 3. Cultures pending 4. Tylenol PRN fever 2. DM2 - 1. Sensitive scale SSI while inpatient 3. HTN - continue home meds 4. ESRD - left voicemail with nephrology for routine dialysis while inpatient    Code Status: Full  Family Communication: Family at bedside Disposition Plan: Admit to inpatient   Time spent: 70 min  Edith Lord M. [facility] Pager [phone]  If 7AM-7PM, please contact the day team taking care of the patient [email or web address] Password [email or web address] 10/31/2015, 2:04 AM

## 2015-11-01 DIAGNOSIS — L8990 Pressure ulcer of unspecified site, unspecified stage: Secondary | ICD-10-CM | POA: Insufficient documentation

## 2015-11-01 LAB — COMPREHENSIVE METABOLIC PANEL
ALBUMIN: 1.7 g/dL — AB (ref 3.5–5.0)
ALT: 16 U/L — ABNORMAL LOW (ref 17–63)
ANION GAP: 12 (ref 5–15)
AST: 105 U/L — ABNORMAL HIGH (ref 15–41)
Alkaline Phosphatase: 115 U/L (ref 38–126)
BILIRUBIN TOTAL: 0.4 mg/dL (ref 0.3–1.2)
BUN: 56 mg/dL — ABNORMAL HIGH (ref 6–20)
CO2: 23 mmol/L (ref 22–32)
Calcium: 8.2 mg/dL — ABNORMAL LOW (ref 8.9–10.3)
Chloride: 100 mmol/L — ABNORMAL LOW (ref 101–111)
Creatinine, Ser: 7.84 mg/dL — ABNORMAL HIGH (ref 0.61–1.24)
GFR calc non Af Amer: 6 mL/min — ABNORMAL LOW (ref >60)
GFR, EST AFRICAN AMERICAN: 7 mL/min — AB (ref >60)
GLUCOSE: 271 mg/dL — AB (ref 65–99)
POTASSIUM: 3.8 mmol/L (ref 3.5–5.1)
SODIUM: 135 mmol/L (ref 135–145)
TOTAL PROTEIN: 5.9 g/dL — AB (ref 6.5–8.1)

## 2015-11-01 LAB — CBC WITH DIFFERENTIAL/PLATELET
BASOS ABS: 0.1 10*3/uL (ref 0.0–0.1)
Basophils Relative: 1 %
Eosinophils Absolute: 0.5 10*3/uL (ref 0.0–0.7)
Eosinophils Relative: 7 %
HCT: 31.9 % — ABNORMAL LOW (ref 39.0–52.0)
Hemoglobin: 10.2 g/dL — ABNORMAL LOW (ref 13.0–17.0)
LYMPHS ABS: 1 10*3/uL (ref 0.7–4.0)
Lymphocytes Relative: 15 %
MCH: 29.8 pg (ref 26.0–34.0)
MCHC: 32 g/dL (ref 30.0–36.0)
MCV: 93.3 fL (ref 78.0–100.0)
MONO ABS: 0.7 10*3/uL (ref 0.1–1.0)
Monocytes Relative: 10 %
Neutro Abs: 4.5 10*3/uL (ref 1.7–7.7)
Neutrophils Relative %: 67 %
Platelets: 119 10*3/uL — ABNORMAL LOW (ref 150–400)
RBC: 3.42 MIL/uL — AB (ref 4.22–5.81)
RDW: 17.8 % — AB (ref 11.5–15.5)
WBC: 6.8 10*3/uL (ref 4.0–10.5)

## 2015-11-01 LAB — GLUCOSE, CAPILLARY
GLUCOSE-CAPILLARY: 159 mg/dL — AB (ref 65–99)
GLUCOSE-CAPILLARY: 164 mg/dL — AB (ref 65–99)
GLUCOSE-CAPILLARY: 220 mg/dL — AB (ref 65–99)
Glucose-Capillary: 253 mg/dL — ABNORMAL HIGH (ref 65–99)
Glucose-Capillary: 145 mg/dL — ABNORMAL HIGH (ref 65–99)

## 2015-11-01 LAB — URINE CULTURE: Culture: >=100000

## 2015-11-01 LAB — TROPONIN I
TROPONIN I: 0.36 ng/mL — AB (ref <0.031)
TROPONIN I: 0.25 ng/mL — AB (ref <0.031)

## 2015-11-01 LAB — PROTIME-INR
INR: 4.8 — AB (ref 0.00–1.49)
PROTHROMBIN TIME: 43.6 s — AB (ref 11.6–15.2)

## 2015-11-01 LAB — VANCOMYCIN, RANDOM: VANCOMYCIN RM: 31 ug/mL

## 2015-11-01 LAB — MAGNESIUM: Magnesium: 2.1 mg/dL (ref 1.7–2.4)

## 2015-11-01 MED ORDER — MEROPENEM 500 MG IV SOLR
500.0000 mg | INTRAVENOUS | Status: DC
  Filled 2015-11-01: qty 0.5

## 2015-11-01 MED ORDER — WARFARIN - PHARMACIST DOSING INPATIENT: Freq: Every day | Status: DC

## 2015-11-01 MED ORDER — SODIUM CHLORIDE 0.9 % IV SOLN
500.0000 mg | INTRAVENOUS | Status: DC
  Administered 2015-11-01 – 2015-11-05 (×5): 500 mg via INTRAVENOUS
  Filled 2015-11-01 (×7): qty 0.5

## 2015-11-01 MED ORDER — FLUCONAZOLE IN SODIUM CHLORIDE 200-0.9 MG/100ML-% IV SOLN
200.0000 mg | INTRAVENOUS | Status: DC
  Administered 2015-11-01 – 2015-11-03 (×3): 200 mg via INTRAVENOUS
  Filled 2015-11-01 (×4): qty 100

## 2015-11-01 MED ORDER — VANCOMYCIN HCL IN DEXTROSE 750-5 MG/150ML-% IV SOLN
750.0000 mg | INTRAVENOUS | Status: DC
  Administered 2015-11-03: 750 mg via INTRAVENOUS
  Filled 2015-11-01 (×2): qty 150

## 2015-11-01 MED ORDER — INSULIN GLARGINE 100 UNIT/ML ~~LOC~~ SOLN
10.0000 [IU] | Freq: Every day | SUBCUTANEOUS | Status: DC
  Filled 2015-11-01: qty 0.1

## 2015-11-01 MED ORDER — NA FERRIC GLUC CPLX IN SUCROSE 12.5 MG/ML IV SOLN
125.0000 mg | Freq: Once | INTRAVENOUS | Status: AC
  Administered 2015-11-01: 125 mg via INTRAVENOUS
  Filled 2015-11-01 (×2): qty 10

## 2015-11-01 NOTE — Procedures (Signed)
Patient was seen on dialysis and the procedure was supervised. Just gettng started   BFR 100  Via PC BP is  118/60.   Patient appears to be tolerating treatment well  Darra Rosa A 11/01/2015

## 2015-11-01 NOTE — Progress Notes (Signed)
[facility] TEAM 1 - Stepdown/ICU TEAM PROGRESS NOTE  [name] [name] [id number] DOB: 04/26/1936 DOA: 10/30/2015 PCP: Tana ConchStephen Hunter, MD  Admit HPI / Brief Narrative: 80 y.o. M Hx CAD, HTN, HLD, CHF, ESRD dialized M/W/F, hypothyroidism, DM2, and a HCAP admission w/ discharge 1/9 on fortaz and vanc with dialysis (last dose 1/13) who was brought in by family with confusion, and decreased appetitie for a couple of days. In the ED he was febrile to 102.6. He was given zosyn, vanc, and IVF, and his mental status improved.  HPI/Subjective: The patient is resting comfortably in bed eating breakfast.  He denies chest pain shortness breath fevers chills nausea or vomiting.  He reports ongoing pain in his left knee which has been a chronic issue and has required arthrocentesis on more than one occasion.  Assessment/Plan:  Sepsis of unclear etiology ?line infection - UA abnormal, but signif in ESRD not clear - follow culture data - cont empiric abx for now - no fever since admit  ESRD on HD M/W/F HD fistula placed 09/10/15 in L UE w/ good thrill to palpation - Nephrology to see to attend to HD   Warfarin induced coagulopathy Dosing per Rx - follow   Mild AST elevation  Follow   Chronic Afib w/ acute RVR  CHADS-VASc 8 - on chronic warfarin - rate controlled   Chronic systolic CHF  Volume management per HD - no evidence of severe volume overload at this time  Elevated troponin - CAD s/p PCI  RCA  Trop peaked at 0.56 - most c/w demand ischemia/rate related - no chest pain   DM2 CBG not at goal - adjust tx and follow  HTN BP currently well controlled   HLD  Hypothyroidism s/p thyroidectomy  Cont home synthroid dose   Code Status: FULL Family Communication: no family present at time of exam Disposition Plan: transfer to med surg renal bed - begin PT/OT - follow fever curve and culture data  Consultants: Nephrology   Procedures: 1/8 TTE - EF 40-45% - hypokinesis anteroseptal  myocardium - mild mitral valve regurg - LA mildly dilated - RA mod dilated - severe TR - PA pressure 39mm Hg  Antibiotics: Zosyn (prior to admit)  Vanc (prior to admit) > Meropenem 1/15 >  DVT prophylaxis: warfarin  Objective: Blood pressure 98/60, pulse 71, temperature 97.5 F (36.4 C), temperature source Oral, resp. rate 22, height 5\' 9"  (1.753 m), weight 75.8 kg (167 lb 1.7 oz), SpO2 99 %.  Intake/Output Summary (Last 24 hours) at 11/01/15 0830 Last data filed at 11/01/15 0500  Gross per 24 hour  Intake 825.83 ml  Output      0 ml  Net 825.83 ml   Exam: General: No acute respiratory distress - alert and conversant  Lungs: Clear to auscultation bilaterally without wheezes or crackles Cardiovascular: Regular rate and without murmur gallop or rub normal S1 and S2 Abdomen: Nontender, nondistended, soft, bowel sounds positive, no rebound, no ascites, no appreciable mass Extremities: No significant cyanosis, clubbing, or edema bilateral lower extremities - L knee w/ appreciable effusion, painful to palpation but not warm or red   Data Reviewed:  Basic Metabolic Panel:  Recent Labs Lab 10/30/15 2357 10/31/15 0345 10/31/15 0834 10/31/15 1108 11/01/15 0212  NA 135 140 137 137 135  K 3.8 2.5* 4.3 4.6 3.8  CL 93* 112* 100* 99* 100*  CO2 25 17* 22 22 23   GLUCOSE 74 104* 69 73 271*  BUN 41* 31* 44* 48*  56*  CREATININE 6.49* 4.28* 6.45* 6.78* 7.84*  CALCIUM 9.3 5.8* 8.7* 8.8* 8.2*  MG  --   --   --   --  2.1  PHOS  --   --   --  5.8*  --     CBC:  Recent Labs Lab 10/30/15 2357 10/31/15 0345 11/01/15 0212  WBC 6.8 6.3 6.8  NEUTROABS 4.4  --  4.5  HGB 12.0* 8.5* 10.2*  HCT 38.6* 28.5* 31.9*  MCV 95.3 95.3 93.3  PLT 165 97* 119*   Liver Function Tests:  Recent Labs Lab 10/30/15 2357 10/31/15 1108 11/01/15 0212  AST 55*  --  105*  ALT 8*  --  16*  ALKPHOS 99  --  115  BILITOT 0.6  --  0.4  PROT 7.3  --  5.9*  ALBUMIN 2.3* 1.9* 1.7*   Coags:  Recent  Labs Lab 10/26/15 10/30/15 2357 10/31/15 0345 11/01/15 0212  INR 3.1 2.64* 4.07* 4.80*   Cardiac Enzymes:  Recent Labs Lab 10/31/15 0345 10/31/15 0834 10/31/15 1410 10/31/15 2010 11/01/15 0212  TROPONINI 0.46* 0.58* 0.56* 0.39* 0.36*    CBG:  Recent Labs Lab 10/31/15 1546 10/31/15 1802 10/31/15 2048 11/01/15 0046 11/01/15 0449  GLUCAP 175* 170* 190* 220* 253*    Recent Results (from the past 240 hour(s))  MRSA PCR Screening     Status: None   Collection Time: 10/23/15  7:06 PM  Result Value Ref Range Status   MRSA by PCR NEGATIVE NEGATIVE Final    Comment:        The GeneXpert MRSA Assay (FDA approved for NASAL specimens only), is one component of a comprehensive MRSA colonization surveillance program. It is not intended to diagnose MRSA infection nor to guide or monitor treatment for MRSA infections.     Studies:   Recent x-ray studies have been reviewed in detail by the Attending Physician  Scheduled Meds:  Scheduled Meds: . calcium acetate  667 mg Oral TID WC  . vitamin B-12  500 mcg Oral Daily  . insulin aspart  0-9 Units Subcutaneous TID WC  . levothyroxine  175 mcg Oral QAC breakfast  . meropenem (MERREM) IV  500 mg Intravenous Q24H  . multivitamin  1 tablet Oral Daily  . rosuvastatin  20 mg Oral Daily  . sodium chloride  3 mL Intravenous Q12H  . vancomycin  750 mg Intravenous Q M,W,F-HD    Time spent on care of this patient: 35 mins   Ziyan Hillmer T , MD   [facility] Office  [phone] Pager - Text Page per Loretha Stapler as per below:  On-Call/Text Page:      [email or web address]      password [email or web address]  If 7PM-7AM, please contact night-coverage [email or web address] Password [email or web address] 11/01/2015, 8:30 AM   LOS: 1 day

## 2015-11-01 NOTE — Progress Notes (Signed)
ANTICOAGULATION CONSULT NOTE - Follow Up Consult  Pharmacy Consult for warfarin Indication: atrial fibrillation  Allergies  Allergen Reactions  . Tomato Other (See Comments)    Too much acid - cannot take due to kidney issues    Patient Measurements: Height: 5\' 9"  (175.3 cm) Weight: 167 lb 1.7 oz (75.8 kg) IBW/kg (Calculated) : 70.7   Vital Signs: Temp: 97.5 F (36.4 C) (01/16 0745) Temp Source: Oral (01/16 0745) BP: 118/60 mmHg (01/16 1000) Pulse Rate: 79 (01/16 0900)  Labs:  Recent Labs  10/30/15 2357  10/31/15 0345 10/31/15 0834 10/31/15 1108 10/31/15 1410 10/31/15 2010 11/01/15 0212  HGB 12.0*  --  8.5*  --   --   --   --  10.2*  HCT 38.6*  --  28.5*  --   --   --   --  31.9*  PLT 165  --  97*  --   --   --   --  119*  LABPROT 27.8*  --  38.5*  --   --   --   --  43.6*  INR 2.64*  --  4.07*  --   --   --   --  4.80*  CREATININE 6.49*  --  4.28* 6.45* 6.78*  --   --  7.84*  TROPONINI  --   < > 0.46* 0.58*  --  0.56* 0.39* 0.36*  < > = values in this interval not displayed.  Estimated Creatinine Clearance: 7.9 mL/min (by C-G formula based on Cr of 7.84).   Assessment: 89 YOM recently admitted, back again with concern for sepsis. To continue warfarin for AFib. Home dose 5mg  daily except 7.5mg  on Mondays and Fridays. INR on admit therapeutic at 2.64, however it is now up to 4.8 despite not receiving any warfarin- last dose 1/14 PTA.  hgb 10.2, plts 119- improved. No bleeding noted.  Goal of Therapy:  INR 2-3 Monitor platelets by anticoagulation protocol: Yes   Plan:  -hold warfarin -daily INR, CBC -follow s/s bleeding  [name] [name] D. Josh Nicolosi, PharmD, BCPS Clinical Pharmacist Pager: [phone] 11/01/2015 11:49 AM

## 2015-11-01 NOTE — Progress Notes (Signed)
Pharmacy Antibiotic Follow-up Note  Assessment:  [name] [name] is a 80 y.o. year-old male admitted on 10/30/2015.  The patient is currently on Vancomycin + Merrem for r/o sepsis. The patient was discharged on Vancomycin + Ceftazidime on 1/9 with plans to continue for 3 more additional doses wit HD. Upon this admission, the patient was re-loaded with Vancomycin. A pre-HD Vancomycin level this evening resulted as SUPRAtherapeutic (VR 31 mcg/ml, goal of 15-25 mcg/ml). Will plan to hold Vancomycin with this HD session and resume dosing with the next scheduled HD.   Plan 1. Hold Vancomycin dose after HD today 2. Continue Vancomycin 750 mg IV post HD-MWF starting on Wed, 1/18 3. Continue Meropenem 500 mg IV every 24 hours 4. Will continue to follow HD schedule/duration, culture results, LOT, and antibiotic de-escalation plans   Temp (24hrs), Avg:97.3 F (36.3 C), Min:97 F (36.1 C), Max:97.6 F (36.4 C)   Recent Labs Lab 10/30/15 2357 10/31/15 0345 11/01/15 0212  WBC 6.8 6.3 6.8    Recent Labs Lab 10/30/15 2357 10/31/15 0345 10/31/15 0834 10/31/15 1108 11/01/15 0212  CREATININE 6.49* 4.28* 6.45* 6.78* 7.84*   Estimated Creatinine Clearance: 7.9 mL/min (by C-G formula based on Cr of 7.84).    Allergies  Allergen Reactions  . Tomato Other (See Comments)    Too much acid - cannot take due to kidney issues    Antimicrobials this admission: Vanc 1/14>> *reloaded with 1500mg  IV x1 in the ED despite being on vanc PTA Merrem 1/15>> Zosyn 1/14 x1  Levels/dose changes this admission: 1/16: Pre-HD Vancomycin level: 31 mcg/ml >> post-HD dose held  Microbiology results: 1/15 UCx >> 100k yeast 1/15 BCx >> ngtd  Thank you for allowing pharmacy to be a part of this patient's care.  Georgina PillionElizabeth Malya Cirillo, PharmD, BCPS Clinical Pharmacist Pager: [phone] 11/01/2015 7:32 PM

## 2015-11-01 NOTE — Consult Note (Signed)
[facility] Renal Consultation Note  Indication for Consultation:  Management of ESRD/hemodialysis; anemia, hypertension/volume and secondary hyperparathyroidism  HPI: [name] [name] is a 80 y.o. male admitted with fever 102.6 yesterday. Was Dc 10/25/15 on Vanco and fortaz  Wit HCAP diagnosis . He was afebrile at last OP HD  Friday 1/13. His family brought him in with reported confusion and decreased appetite. He now recognizes me from op kid center, confused slightly to dates ,and aware in hospital. Reports ate all his lunch. He is somewhat of a poor historian and not aware why he is here. No family in room. Denies any PC or other infectious sxms- dirty appearing urine positive for yeast- started on meropenem      Past Medical History  Diagnosis Date  . CAD (coronary artery disease)     a. s/p PCI to mid and mid-distal RCA, 40% sten L main, no sig dz LCx  b. myoview 11/24/2014 inferoapex ischemia, medical management due to high risk for contrast nephropathy  . HTN (hypertension)   . Hyperlipidemia   . Cerebrovascular accident (HCC)   . CHF (congestive heart failure) (HCC)     EF 35% echo 2012 ; EF 44% myoview 2012; EF 76% by [facility] November 2014  . Diabetes mellitus   . 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 Unspecified hypothyroidism   . Gout   . Respiratory failure, acute (HCC) 07/21/2015    Past Surgical History  Procedure Laterality Date  . Thyroidectomy    . Av fistula placement Left 09/10/2015    Procedure: BRACHIOCEPHALIC ARTERIOVENOUS (AV) FISTULA CREATION;  Surgeon: Nada Libman, MD;  Location: [facility] OR;  Service: Vascular;  Laterality: Left;      Family History  Problem Relation Age of Onset  . Kidney failure Mother   . Diabetes Mother   . Cancer Father     lung  . Heart attack Brother     massive heart attack in 25s  . Hypertension Mother       reports that he quit smoking about 62 years ago. His smoking use included Cigarettes. He  smoked 0.50 packs per day. He does not have any smokeless tobacco history on file. He reports that he does not drink alcohol or use illicit drugs.   Allergies  Allergen Reactions  . Tomato Other (See Comments)    Too much acid - cannot take due to kidney issues    Prior to Admission medications   Medication Sig Start Date End Date Taking? Authorizing Provider  calcium acetate (PHOSLO) 667 MG capsule Take 1 capsule (667 mg total) by mouth 3 (three) times daily with meals. 09/30/15  Yes [name], MD  carvedilol (COREG) 12.5 MG tablet Take 1 tablet (12.5 mg total) by mouth 2 (two) times daily with a meal. 01/11/15  Yes [name] Patty, MD  cefTAZidime 2 g in dextrose 5 % 50 mL Inject 2 g into the vein every Monday, Wednesday, and Friday at 6 PM. Last dose on 10/29/15 10/25/15  Yes Catarina Hartshorn, MD  diltiazem (CARDIZEM CD) 120 MG 24 hr capsule Take 1 capsule (120 mg total) by mouth daily. 10/25/15  Yes Catarina Hartshorn, MD  glipiZIDE (GLUCOTROL) 5 MG tablet Take 2.5-5 mg by mouth 2 (two) times daily before a meal. Take 1/2 tablet (2.5 mg) daily before breakfast and 1 tablet (5 mg) before supper 05/01/15  Yes Historical Provider, MD  levothyroxine (SYNTHROID, LEVOTHROID) 175 MCG tablet Take  1 tablet (175 mcg total) by mouth daily. 08/13/15  Yes Marin Olp, MD  multivitamin (RENA-VIT) TABS tablet Take 1 tablet by mouth daily.   Yes Historical Provider, MD  nitroGLYCERIN (NITROSTAT) 0.4 MG SL tablet Place 1 tablet (0.4 mg total) under the tongue every 5 (five) minutes as needed for chest pain. 11/25/14 04/25/17 Yes Almyra Deforest, PA  olopatadine (PATANOL) 0.1 % ophthalmic solution Place 1 drop into both eyes 2 (two) times daily. Patient taking differently: Place 1 drop into both eyes daily.  04/20/15  Yes Marin Olp, MD  rosuvastatin (CRESTOR) 20 MG tablet Take 1 tablet (20 mg total) by mouth daily. 10/07/15  Yes Lelon Perla, MD  Vancomycin (VANCOCIN) 750 MG/150ML SOLN Inject 150 mLs (750 mg total)  into the vein every Monday, Wednesday, and Friday with hemodialysis. Last dose on 11/01/15 10/25/15  Yes Orson Eva, MD  vitamin B-12 500 MCG tablet Take 1 tablet (500 mcg total) by mouth daily. 10/25/15  Yes Orson Eva, MD  warfarin (COUMADIN) 5 MG tablet Take 5 mg by mouth daily. 10/12/15  Yes Historical Provider, MD  warfarin (COUMADIN) 1 MG tablet Take 1 tablet by mouth daily or as directed by coumadin clinic Patient not taking: Reported on 10/31/2015 10/25/15   Orson Eva, MD    Continuous:   Results for orders placed or performed during the hospital encounter of 10/30/15 (from the past 48 hour(s))  CBG monitoring, ED     Status: None   Collection Time: 10/30/15 11:13 PM  Result Value Ref Range   Glucose-Capillary 73 65 - 99 mg/dL  I-Stat CG4 Lactic Acid, ED  (not at  [facility])     Status: None   Collection Time: 10/30/15 11:57 PM  Result Value Ref Range   Lactic Acid, Venous 1.91 0.5 - 2.0 mmol/L  Comprehensive metabolic panel     Status: Abnormal   Collection Time: 10/30/15 11:57 PM  Result Value Ref Range   Sodium 135 135 - 145 mmol/L   Potassium 3.8 3.5 - 5.1 mmol/L   Chloride 93 (L) 101 - 111 mmol/L   CO2 25 22 - 32 mmol/L   Glucose, Bld 74 65 - 99 mg/dL   BUN 41 (H) 6 - 20 mg/dL   Creatinine, Ser 6.49 (H) 0.61 - 1.24 mg/dL   Calcium 9.3 8.9 - 10.3 mg/dL   Total Protein 7.3 6.5 - 8.1 g/dL   Albumin 2.3 (L) 3.5 - 5.0 g/dL   AST 55 (H) 15 - 41 U/L   ALT 8 (L) 17 - 63 U/L   Alkaline Phosphatase 99 38 - 126 U/L   Total Bilirubin 0.6 0.3 - 1.2 mg/dL   GFR calc non Af Amer 7 (L) >60 mL/min   GFR calc Af Amer 9 (L) >60 mL/min    Comment: (NOTE) The eGFR has been calculated using the CKD EPI equation. This calculation has not been validated in all clinical situations. eGFR's persistently <60 mL/min signify possible Chronic Kidney Disease.    Anion gap 17 (H) 5 - 15  CBC WITH DIFFERENTIAL     Status: Abnormal   Collection Time: 10/30/15 11:57 PM  Result Value Ref Range   WBC 6.8 4.0  - 10.5 K/uL   RBC 4.05 (L) 4.22 - 5.81 MIL/uL   Hemoglobin 12.0 (L) 13.0 - 17.0 g/dL   HCT 38.6 (L) 39.0 - 52.0 %   MCV 95.3 78.0 - 100.0 fL   MCH 29.6 26.0 - 34.0 pg  MCHC 31.1 30.0 - 36.0 g/dL   RDW 17.5 (H) 11.5 - 15.5 %   Platelets 165 150 - 400 K/uL   Neutrophils Relative % 65 %   Lymphocytes Relative 22 %   Monocytes Relative 11 %   Eosinophils Relative 1 %   Basophils Relative 1 %   Neutro Abs 4.4 1.7 - 7.7 K/uL   Lymphs Abs 1.5 0.7 - 4.0 K/uL   Monocytes Absolute 0.7 0.1 - 1.0 K/uL   Eosinophils Absolute 0.1 0.0 - 0.7 K/uL   Basophils Absolute 0.1 0.0 - 0.1 K/uL   RBC Morphology ELLIPTOCYTES    Smear Review LARGE PLATELETS PRESENT     Comment: PLATELETS APPEAR ADEQUATE  Protime-INR     Status: Abnormal   Collection Time: 10/30/15 11:57 PM  Result Value Ref Range   Prothrombin Time 27.8 (H) 11.6 - 15.2 seconds   INR 2.64 (H) 0.00 - 1.49  I-Stat arterial blood gas, ED     Status: Abnormal   Collection Time: 10/31/15 12:07 AM  Result Value Ref Range   pH, Arterial 7.517 (H) 7.350 - 7.450   pCO2 arterial 35.7 35.0 - 45.0 mmHg   pO2, Arterial 374.0 (H) 80.0 - 100.0 mmHg   Bicarbonate 29.0 (H) 20.0 - 24.0 mEq/L   TCO2 30 0 - 100 mmol/L   O2 Saturation 100.0 %   Acid-Base Excess 6.0 (H) 0.0 - 2.0 mmol/L   Patient temperature 98.6 F    Collection site RADIAL, ALLEN'S TEST ACCEPTABLE    Sample type ARTERIAL   Blood Culture (routine x 2)     Status: None (Preliminary result)   Collection Time: 10/31/15 12:45 AM  Result Value Ref Range   Specimen Description BLOOD RIGHT HAND    Special Requests IN PEDIATRIC BOTTLE 3CC    Culture NO GROWTH 1 DAY    Report Status PENDING   Blood Culture (routine x 2)     Status: None (Preliminary result)   Collection Time: 10/31/15 12:49 AM  Result Value Ref Range   Specimen Description BLOOD LEFT HAND    Special Requests IN PEDIATRIC BOTTLE 4CC    Culture NO GROWTH 1 DAY    Report Status PENDING   Urinalysis, Routine w reflex  microscopic (not at [facility])     Status: Abnormal   Collection Time: 10/31/15  2:01 AM  Result Value Ref Range   Color, Urine AMBER (A) YELLOW    Comment: BIOCHEMICALS MAY BE AFFECTED BY COLOR   APPearance TURBID (A) CLEAR   Specific Gravity, Urine 1.035 (H) 1.005 - 1.030   pH 5.0 5.0 - 8.0   Glucose, UA NEGATIVE NEGATIVE mg/dL   Hgb urine dipstick MODERATE (A) NEGATIVE   Bilirubin Urine MODERATE (A) NEGATIVE   Ketones, ur 15 (A) NEGATIVE mg/dL   Protein, ur >300 (A) NEGATIVE mg/dL   Nitrite POSITIVE (A) NEGATIVE   Leukocytes, UA MODERATE (A) NEGATIVE  Urine microscopic-add on     Status: Abnormal   Collection Time: 10/31/15  2:01 AM  Result Value Ref Range   Squamous Epithelial / LPF 0-5 (A) NONE SEEN   WBC, UA TOO NUMEROUS TO COUNT 0 - 5 WBC/hpf   RBC / HPF 0-5 0 - 5 RBC/hpf   Bacteria, UA MANY (A) NONE SEEN   Urine-Other MUCOUS PRESENT     Comment: AMORPHOUS URATES/PHOSPHATES YEAST PRESENT   Protime-INR     Status: Abnormal   Collection Time: 10/31/15  3:45 AM  Result Value Ref Range  Prothrombin Time 38.5 (H) 11.6 - 15.2 seconds   INR 4.07 (H) 0.00 - 1.49  CBC     Status: Abnormal   Collection Time: 10/31/15  3:45 AM  Result Value Ref Range   WBC 6.3 4.0 - 10.5 K/uL   RBC 2.99 (L) 4.22 - 5.81 MIL/uL   Hemoglobin 8.5 (L) 13.0 - 17.0 g/dL    Comment: DELTA CHECK NOTED REPEATED TO VERIFY SPECIMEN CHECKED FOR CLOTS    HCT 28.5 (L) 39.0 - 52.0 %   MCV 95.3 78.0 - 100.0 fL   MCH 28.4 26.0 - 34.0 pg   MCHC 29.8 (L) 30.0 - 36.0 g/dL   RDW 17.8 (H) 11.5 - 15.5 %   Platelets 97 (L) 150 - 400 K/uL    Comment: SPECIMEN CHECKED FOR CLOTS REPEATED TO VERIFY PLATELET COUNT CONFIRMED BY SMEAR   Basic metabolic panel     Status: Abnormal   Collection Time: 10/31/15  3:45 AM  Result Value Ref Range   Sodium 140 135 - 145 mmol/L   Potassium 2.5 (LL) 3.5 - 5.1 mmol/L    Comment: DELTA CHECK NOTED CRITICAL RESULT CALLED TO, READ BACK BY AND VERIFIED WITH: MARYKATE,E RN 0440  1.15.17 MCADOO,G    Chloride 112 (H) 101 - 111 mmol/L   CO2 17 (L) 22 - 32 mmol/L   Glucose, Bld 104 (H) 65 - 99 mg/dL   BUN 31 (H) 6 - 20 mg/dL   Creatinine, Ser 4.28 (H) 0.61 - 1.24 mg/dL    Comment: DELTA CHECK NOTED   Calcium 5.8 (LL) 8.9 - 10.3 mg/dL    Comment: DELTA CHECK NOTED CRITICAL RESULT CALLED TO, READ BACK BY AND VERIFIED WITH: MARYKATE,E RN 0440 1.15.17 MCADOO,G    GFR calc non Af Amer 12 (L) >60 mL/min   GFR calc Af Amer 14 (L) >60 mL/min    Comment: (NOTE) The eGFR has been calculated using the CKD EPI equation. This calculation has not been validated in all clinical situations. eGFR's persistently <60 mL/min signify possible Chronic Kidney Disease.    Anion gap 11 5 - 15  Troponin I     Status: Abnormal   Collection Time: 10/31/15  3:45 AM  Result Value Ref Range   Troponin I 0.46 (H) <0.031 ng/mL    Comment:        PERSISTENTLY INCREASED TROPONIN VALUES IN THE RANGE OF 0.04-0.49 ng/mL CAN BE SEEN IN:       -UNSTABLE ANGINA       -CONGESTIVE HEART FAILURE       -MYOCARDITIS       -CHEST TRAUMA       -ARRYHTHMIAS       -LATE PRESENTING MYOCARDIAL INFARCTION       -COPD   CLINICAL FOLLOW-UP RECOMMENDED.   I-Stat CG4 Lactic Acid, ED  (not at  [facility])     Status: None   Collection Time: 10/31/15  3:52 AM  Result Value Ref Range   Lactic Acid, Venous 1.43 0.5 - 2.0 mmol/L  Basic metabolic panel     Status: Abnormal   Collection Time: 10/31/15  8:34 AM  Result Value Ref Range   Sodium 137 135 - 145 mmol/L   Potassium 4.3 3.5 - 5.1 mmol/L    Comment: DELTA CHECK NOTED   Chloride 100 (L) 101 - 111 mmol/L   CO2 22 22 - 32 mmol/L   Glucose, Bld 69 65 - 99 mg/dL   BUN 44 (H) 6 - 20 mg/dL  Creatinine, Ser 6.45 (H) 0.61 - 1.24 mg/dL    Comment: DELTA CHECK NOTED   Calcium 8.7 (L) 8.9 - 10.3 mg/dL   GFR calc non Af Amer 7 (L) >60 mL/min   GFR calc Af Amer 9 (L) >60 mL/min    Comment: (NOTE) The eGFR has been calculated using the CKD EPI equation. This  calculation has not been validated in all clinical situations. eGFR's persistently <60 mL/min signify possible Chronic Kidney Disease.    Anion gap 15 5 - 15  Troponin I (q 6hr x 3)     Status: Abnormal   Collection Time: 10/31/15  8:34 AM  Result Value Ref Range   Troponin I 0.58 (HH) <0.031 ng/mL    Comment:        POSSIBLE MYOCARDIAL ISCHEMIA. SERIAL TESTING RECOMMENDED. CRITICAL RESULT CALLED TO, READ BACK BY AND VERIFIED WITH: SADERFIELDLRN [id number] MCCAULEG   Glucose, capillary     Status: None   Collection Time: 10/31/15  9:18 AM  Result Value Ref Range   Glucose-Capillary 72 65 - 99 mg/dL  Renal function panel     Status: Abnormal   Collection Time: 10/31/15 11:08 AM  Result Value Ref Range   Sodium 137 135 - 145 mmol/L   Potassium 4.6 3.5 - 5.1 mmol/L   Chloride 99 (L) 101 - 111 mmol/L   CO2 22 22 - 32 mmol/L   Glucose, Bld 73 65 - 99 mg/dL   BUN 48 (H) 6 - 20 mg/dL   Creatinine, Ser 6.78 (H) 0.61 - 1.24 mg/dL   Calcium 8.8 (L) 8.9 - 10.3 mg/dL   Phosphorus 5.8 (H) 2.5 - 4.6 mg/dL   Albumin 1.9 (L) 3.5 - 5.0 g/dL   GFR calc non Af Amer 7 (L) >60 mL/min   GFR calc Af Amer 8 (L) >60 mL/min    Comment: (NOTE) The eGFR has been calculated using the CKD EPI equation. This calculation has not been validated in all clinical situations. eGFR's persistently <60 mL/min signify possible Chronic Kidney Disease.    Anion gap 16 (H) 5 - 15  Glucose, capillary     Status: Abnormal   Collection Time: 10/31/15 11:32 AM  Result Value Ref Range   Glucose-Capillary 64 (L) 65 - 99 mg/dL   Comment 1 Notify RN    Comment 2 Document in Chart   Glucose, capillary     Status: Abnormal   Collection Time: 10/31/15 12:16 PM  Result Value Ref Range   Glucose-Capillary 155 (H) 65 - 99 mg/dL   Comment 1 Notify RN    Comment 2 Document in Chart   Troponin I (q 6hr x 3)     Status: Abnormal   Collection Time: 10/31/15  2:10 PM  Result Value Ref Range   Troponin I 0.56 (HH) <0.031  ng/mL    Comment:        POSSIBLE MYOCARDIAL ISCHEMIA. SERIAL TESTING RECOMMENDED. CRITICAL VALUE NOTED.  VALUE IS CONSISTENT WITH PREVIOUSLY REPORTED AND CALLED VALUE.   Glucose, capillary     Status: Abnormal   Collection Time: 10/31/15  3:46 PM  Result Value Ref Range   Glucose-Capillary 175 (H) 65 - 99 mg/dL  Glucose, capillary     Status: Abnormal   Collection Time: 10/31/15  6:02 PM  Result Value Ref Range   Glucose-Capillary 170 (H) 65 - 99 mg/dL  Troponin I (q 6hr x 3)     Status: Abnormal   Collection Time: 10/31/15  8:10 PM  Result Value Ref Range   Troponin I 0.39 (H) <0.031 ng/mL    Comment:        PERSISTENTLY INCREASED TROPONIN VALUES IN THE RANGE OF 0.04-0.49 ng/mL CAN BE SEEN IN:       -UNSTABLE ANGINA       -CONGESTIVE HEART FAILURE       -MYOCARDITIS       -CHEST TRAUMA       -ARRYHTHMIAS       -LATE PRESENTING MYOCARDIAL INFARCTION       -COPD   CLINICAL FOLLOW-UP RECOMMENDED.   Glucose, capillary     Status: Abnormal   Collection Time: 10/31/15  8:48 PM  Result Value Ref Range   Glucose-Capillary 190 (H) 65 - 99 mg/dL   Comment 1 Notify RN   Glucose, capillary     Status: Abnormal   Collection Time: 11/01/15 12:46 AM  Result Value Ref Range   Glucose-Capillary 220 (H) 65 - 99 mg/dL   Comment 1 Notify RN   Protime-INR     Status: Abnormal   Collection Time: 11/01/15  2:12 AM  Result Value Ref Range   Prothrombin Time 43.6 (H) 11.6 - 15.2 seconds   INR 4.80 (H) 0.00 - 1.49  CBC with Differential/Platelet     Status: Abnormal   Collection Time: 11/01/15  2:12 AM  Result Value Ref Range   WBC 6.8 4.0 - 10.5 K/uL   RBC 3.42 (L) 4.22 - 5.81 MIL/uL   Hemoglobin 10.2 (L) 13.0 - 17.0 g/dL   HCT 31.9 (L) 39.0 - 52.0 %   MCV 93.3 78.0 - 100.0 fL   MCH 29.8 26.0 - 34.0 pg   MCHC 32.0 30.0 - 36.0 g/dL   RDW 17.8 (H) 11.5 - 15.5 %   Platelets 119 (L) 150 - 400 K/uL   Neutrophils Relative % 67 %   Lymphocytes Relative 15 %   Monocytes Relative 10 %    Eosinophils Relative 7 %   Basophils Relative 1 %   Neutro Abs 4.5 1.7 - 7.7 K/uL   Lymphs Abs 1.0 0.7 - 4.0 K/uL   Monocytes Absolute 0.7 0.1 - 1.0 K/uL   Eosinophils Absolute 0.5 0.0 - 0.7 K/uL   Basophils Absolute 0.1 0.0 - 0.1 K/uL   RBC Morphology ELLIPTOCYTES    WBC Morphology ATYPICAL LYMPHOCYTES   Comprehensive metabolic panel     Status: Abnormal   Collection Time: 11/01/15  2:12 AM  Result Value Ref Range   Sodium 135 135 - 145 mmol/L   Potassium 3.8 3.5 - 5.1 mmol/L    Comment: DELTA CHECK NOTED NO VISIBLE HEMOLYSIS    Chloride 100 (L) 101 - 111 mmol/L   CO2 23 22 - 32 mmol/L   Glucose, Bld 271 (H) 65 - 99 mg/dL   BUN 56 (H) 6 - 20 mg/dL   Creatinine, Ser 7.84 (H) 0.61 - 1.24 mg/dL   Calcium 8.2 (L) 8.9 - 10.3 mg/dL   Total Protein 5.9 (L) 6.5 - 8.1 g/dL   Albumin 1.7 (L) 3.5 - 5.0 g/dL   AST 105 (H) 15 - 41 U/L   ALT 16 (L) 17 - 63 U/L   Alkaline Phosphatase 115 38 - 126 U/L   Total Bilirubin 0.4 0.3 - 1.2 mg/dL   GFR calc non Af Amer 6 (L) >60 mL/min   GFR calc Af Amer 7 (L) >60 mL/min    Comment: (NOTE) The eGFR has been calculated using the CKD EPI equation. This  calculation has not been validated in all clinical situations. eGFR's persistently <60 mL/min signify possible Chronic Kidney Disease.    Anion gap 12 5 - 15  Magnesium     Status: None   Collection Time: 11/01/15  2:12 AM  Result Value Ref Range   Magnesium 2.1 1.7 - 2.4 mg/dL  Troponin I (q 6hr x 3)     Status: Abnormal   Collection Time: 11/01/15  2:12 AM  Result Value Ref Range   Troponin I 0.36 (H) <0.031 ng/mL    Comment:        PERSISTENTLY INCREASED TROPONIN VALUES IN THE RANGE OF 0.04-0.49 ng/mL CAN BE SEEN IN:       -UNSTABLE ANGINA       -CONGESTIVE HEART FAILURE       -MYOCARDITIS       -CHEST TRAUMA       -ARRYHTHMIAS       -LATE PRESENTING MYOCARDIAL INFARCTION       -COPD   CLINICAL FOLLOW-UP RECOMMENDED.   Glucose, capillary     Status: Abnormal   Collection Time:  11/01/15  4:49 AM  Result Value Ref Range   Glucose-Capillary 253 (H) 65 - 99 mg/dL   Comment 1 Notify RN   Glucose, capillary     Status: Abnormal   Collection Time: 11/01/15  7:42 AM  Result Value Ref Range   Glucose-Capillary 164 (H) 65 - 99 mg/dL     ROS: Pt poor historian see HPI   Physical Exam: Filed Vitals:   11/01/15 0900 11/01/15 1000  BP: 110/57 118/60  Pulse: 79   Temp:    Resp: 22 20     General: alert elderly BM , NAD, chronically ill appearing  HEENT: [place] , MMM, EOMI Neck: supple no jvd Heart: RRR,  1/6 Mur. No rub or gallop Lungs: CTA  bilat Abdomen: soft, NT/ ND  BS pos. Extremities: no pedal edema Skin: no pedal ulcers or overt rash Neuro: alert moves all extrem. Appears about at baseline MS Dialysis Access: R IJ  Perm cath NT , No dc noted / LUA AVF pos bruit developing well   Dialysis Orders: Center: [facility] on MWF . EDW 72.5  HD Bath 2k,2.25ca  Time 4hr Heparin 1500. Access RIJ perm cath / Lua avf ( insert 09/10/15)      Hect. 1.0  mcg IV/HD    Units IV/HD  Venofer  '100mg'$  bolus  Last dose 11/01/15 needs '50mg'$  q wkk   Other op lab hgb 11.2 / ca 9.9 phos 3.6  TFS 13% PTH 297  Assessment/Plan 1. Sepsis - ?? Perm cath related / BC no growth so far / afeb since admit  On Antibx per admit- consider adding on diflucan for yeast in urine?  2. ESRD -  HD today on schedule MWF/  k 3.8 use 3.0 k bath / AVF   Developing  If temp spike on hd with p. Cath dc perm cath  3. Hypertension/volume  -  By wt 3 kg over edw and cxr showing some vol excess attempt 3 to 3.5 l uf on hd today/ on Diltiazem cd 120 and coreg 12.5 mg  Bid on home meds (Both held sec low bp)  4. Anemia  - hgb 10.2  No esa as op / monitor Hgb / Fe today then '50mg'$   Next wed hd 5. Metabolic bone disease -  Dc vit d with Ca cor ^  / binder with meal ,fu labs  6.  Warfarin induced  Coagulopathy  7. DM type 2 =per admit 8. Dementia= about baseline MS  Ernest Haber, PA-C [place]  [phone] 11/01/2015, 12:16 PM   Patient seen and examined, agree with above note with above modifications. 80 year old BM fairly new start to HD- returining with fever- was on vanc and fortaz as OP- now with dirty urine and yeast on culture- could this be source?  Also need to be concerned about line sepsis- cultures taken- PC not obvioulsly infected- AVF is 54 weeks old likely not ready yet.  HD today to keep on schedule  Corliss Parish, MD 11/01/2015

## 2015-11-01 NOTE — Progress Notes (Signed)
Patient arrived on unit from 43M via bed.  No family at bedside.  Skin tear, partial thickness, noted on left buttock.

## 2015-11-01 NOTE — Clinical Documentation Improvement (Signed)
Internal Medicine  Can the diagnosis of CHF be further specified? Please document findings in next progress note; NOT in BPA drop down box. Thanks!    Acuity - Acute, Chronic, Acute on Chronic   Type - Systolic, Diastolic, Systolic and Diastolic  Other  Clinically Undetermined  Document any associated diagnoses/conditions  Supporting Information:  ECHO performed on 10/24/15 reveals: EF of 40-45%, systolic function mildly to moderately reduced with hypokinesis of anteroseptal myocardium  Was being treated with PO Coreg 12.5 mg twice daily but discontinued  Please exercise your independent, professional judgment when responding. A specific answer is not anticipated or expected.  Thank You,  Shellee MiloEileen T Mattisen Pohlmann RN, BSN, CCDS Health Information Management [place] [phone]; Cell: [phone]

## 2015-11-02 LAB — COMPREHENSIVE METABOLIC PANEL
ALT: 25 U/L (ref 17–63)
AST: 133 U/L — AB (ref 15–41)
Albumin: 1.8 g/dL — ABNORMAL LOW (ref 3.5–5.0)
Alkaline Phosphatase: 132 U/L — ABNORMAL HIGH (ref 38–126)
Anion gap: 13 (ref 5–15)
BILIRUBIN TOTAL: 0.8 mg/dL (ref 0.3–1.2)
BUN: 21 mg/dL — AB (ref 6–20)
CHLORIDE: 98 mmol/L — AB (ref 101–111)
CO2: 26 mmol/L (ref 22–32)
CREATININE: 4.72 mg/dL — AB (ref 0.61–1.24)
Calcium: 8.1 mg/dL — ABNORMAL LOW (ref 8.9–10.3)
GFR calc Af Amer: 13 mL/min — ABNORMAL LOW (ref >60)
GFR, EST NON AFRICAN AMERICAN: 11 mL/min — AB (ref >60)
GLUCOSE: 82 mg/dL (ref 65–99)
Potassium: 3.9 mmol/L (ref 3.5–5.1)
Sodium: 137 mmol/L (ref 135–145)
TOTAL PROTEIN: 6.6 g/dL (ref 6.5–8.1)

## 2015-11-02 LAB — CBC WITH DIFFERENTIAL/PLATELET
BASOS PCT: 1 %
Basophils Absolute: 0.1 10*3/uL (ref 0.0–0.1)
EOS PCT: 7 %
Eosinophils Absolute: 0.4 10*3/uL (ref 0.0–0.7)
HEMATOCRIT: 34.9 % — AB (ref 39.0–52.0)
Hemoglobin: 10.6 g/dL — ABNORMAL LOW (ref 13.0–17.0)
LYMPHS PCT: 19 %
Lymphs Abs: 1 10*3/uL (ref 0.7–4.0)
MCH: 29 pg (ref 26.0–34.0)
MCHC: 30.4 g/dL (ref 30.0–36.0)
MCV: 95.4 fL (ref 78.0–100.0)
MONOS PCT: 16 %
Monocytes Absolute: 0.9 10*3/uL (ref 0.1–1.0)
NEUTROS PCT: 57 %
Neutro Abs: 3 10*3/uL (ref 1.7–7.7)
Platelets: 122 10*3/uL — ABNORMAL LOW (ref 150–400)
RBC: 3.66 MIL/uL — ABNORMAL LOW (ref 4.22–5.81)
RDW: 17.5 % — ABNORMAL HIGH (ref 11.5–15.5)
WBC: 5.4 10*3/uL (ref 4.0–10.5)

## 2015-11-02 LAB — PROTIME-INR
INR: 3.92 — ABNORMAL HIGH (ref 0.00–1.49)
Prothrombin Time: 37.5 seconds — ABNORMAL HIGH (ref 11.6–15.2)

## 2015-11-02 LAB — GLUCOSE, CAPILLARY
GLUCOSE-CAPILLARY: 90 mg/dL (ref 65–99)
GLUCOSE-CAPILLARY: 227 mg/dL — AB (ref 65–99)
Glucose-Capillary: 122 mg/dL — ABNORMAL HIGH (ref 65–99)
Glucose-Capillary: 100 mg/dL — ABNORMAL HIGH (ref 65–99)

## 2015-11-02 MED ORDER — DOXERCALCIFEROL 4 MCG/2ML IV SOLN
1.0000 ug | INTRAVENOUS | Status: DC
  Administered 2015-11-03 – 2015-11-08 (×3): 1 ug via INTRAVENOUS
  Filled 2015-11-02 (×3): qty 2

## 2015-11-02 MED ORDER — NEPRO/CARBSTEADY PO LIQD
237.0000 mL | Freq: Two times a day (BID) | ORAL | Status: DC
  Administered 2015-11-02 – 2015-11-11 (×11): 237 mL via ORAL

## 2015-11-02 MED ORDER — ACETAMINOPHEN 650 MG RE SUPP
650.0000 mg | RECTAL | Status: DC | PRN
  Administered 2015-11-02: 650 mg via RECTAL
  Filled 2015-11-02: qty 1

## 2015-11-02 NOTE — Progress Notes (Signed)
Temperature this morning was 100 F orally.  Patient refused tylenol; stated he did not want it.  Notified NP on-call. Will continue to monitor.

## 2015-11-02 NOTE — Evaluation (Addendum)
Occupational Therapy Evaluation Patient Details Name: [name] [name] MRN: [id number] DOB: 06/20/1936 Today's Date: 11/02/2015    History of Present Illness 80 y.o. M Hx CAD, HTN, HLD, CHF, ESRD dialized M/W/F, hypothyroidism, DM2, and a HCAP admission w/ discharge 1/9 on fortaz and vanc with dialysis (last dose 1/13) who was brought in by family with confusion, and decreased appetitie for a couple of days. In the ED he was febrile to 102.6.   Clinical Impression   Pt with decreased active participation in eval.  Total assist +2 for supine to sit as well as for squat pivot transfer from bed to bedside chair.  Pt needing total assist for self feeding as well.  Pt's daughter [name] [name] present for session as well and feels that there is likely not enough assist at home to care for pt at current functional level.  She agrees that SNF is likely the best discharge plan at this time for further rehab.      Follow Up Recommendations  SNF;Supervision/Assistance - 24 hour    Equipment Recommendations  Other (comment) (TBD)       Precautions / Restrictions Precautions Precautions: Fall Restrictions Weight Bearing Restrictions: No      Mobility Bed Mobility Overal bed mobility: Needs Assistance Bed Mobility: Supine to Sit     Supine to sit: +2 for physical assistance;Total assist     General bed mobility comments: Pt in chair throughout session  Transfers Overall transfer level: Needs assistance Equipment used: 2 person hand held assist Transfers: Sit to/from Stand Sit to Stand: Total assist;+2 physical assistance         General transfer comment: Pt assisted very little. PT positioned feet and Total A x 2 to get to 50% upright. Excessive hip flexion and kyphotic spine    Balance     Sitting balance-Leahy Scale: Poor       Standing balance-Leahy Scale: Zero                              ADL Overall ADL's : Needs assistance/impaired Eating/Feeding: Total  assistance;Sitting   Grooming: Total assistance;Sitting   Upper Body Bathing: Total assistance;Sitting   Lower Body Bathing: Bed level;Total assistance                         General ADL Comments: Pt with decreased initiation or active participation in OT eval this session.  Pt initially not even opening mouth when spoon was pressed against his lips.  He did open for straw but he could not suck any liquid through the straw.  Eventually, he did open his mouth and take a small bite of a peach when presented to him.  He was unable to hold the utensil to assist with feeding without max assist this session.       Vision Vision Assessment?:  (unable to determine)   Perception Perception Perception Tested?: No   Praxis Praxis Praxis tested?: Not tested    Pertinent Vitals/Pain Pain Assessment: Faces Faces Pain Scale: Hurts even more Pain Location: B LE, moving of the LEs Pain Descriptors / Indicators: Grimacing Pain Intervention(s): Monitored during session     Hand Dominance Right   Extremity/Trunk Assessment Upper Extremity Assessment Upper Extremity Assessment: Difficult to assess due to impaired cognition (pts grip on R armrest shows at least 3/5 strength)   Lower Extremity Assessment Lower Extremity Assessment: Difficult to assess due to impaired cognition (  unable to stand)   Cervical / Trunk Assessment Cervical / Trunk Assessment: Kyphotic   Communication Communication Communication: Expressive difficulties;Receptive difficulties   Cognition Arousal/Alertness: Lethargic Behavior During Therapy: Flat affect Overall Cognitive Status: Impaired/Different from baseline Area of Impairment: Orientation;Attention;Memory;Following commands;Awareness Orientation Level:  (pt non-verbal throughout evaluation) Current Attention Level: Focused           General Comments: Pt did not follow any one step commands during session or make any verbal communication               Home Living Family/patient expects to be discharged to:: Skilled nursing facility Living Arrangements: Children;Spouse/significant other Available Help at Discharge: Family;Available 24 hours/day Type of Home: House Home Access: Stairs to enter [employer] of Steps: 2 Entrance Stairs-Rails: Left Home Layout: One level     Bathroom Shower/Tub: Chief Strategy Officer: Standard     Home Equipment: Cane - quad;Hand held Careers information officer - 2 wheels   Additional Comments: All home information, PLF, and discharge plan discussed with daughter in room      Prior Functioning/Environment Level of Independence: Independent with assistive device(s)        Comments: Walker to ambulate    OT Diagnosis: Generalized weakness;Cognitive deficits   OT Problem List: Decreased strength;Decreased activity tolerance;Impaired balance (sitting and/or standing);Decreased cognition;Decreased knowledge of use of DME or AE   OT Treatment/Interventions: Self-care/ADL training;Patient/family education;Balance training;Therapeutic activities;DME and/or AE instruction    OT Goals(Current goals can be found in the care plan section) Acute Rehab OT Goals Patient Stated Goal: Pt did not state OT Goal Formulation: With family Time For Goal Achievement: 11/16/15 Potential to Achieve Goals: Good ADL Goals Pt Will Perform Eating: with min assist;sitting;with adaptive utensils Pt Will Perform Grooming: with min assist;sitting (2 tasks sitting unsupported EOB or chair) Pt Will Perform Upper Body Bathing: with min assist;sitting Pt Will Transfer to Toilet: with mod assist;stand pivot transfer Additional ADL Goal #1: Pt will transfer supine to sit EOB with mod assist in preparation for selfcare tasks. Additional ADL Goal #2: Pt will follow 3 one step commands related to selfcare tasks with supervision.   OT Frequency: Min 2X/week              End of Session Nurse  Communication: Mobility status;Need for lift equipment  Activity Tolerance: Patient limited by lethargy Patient left: in chair;with call bell/phone within reach;with family/visitor present   Time: 1610-9604 OT Time Calculation (min): 43 min Charges:  OT General Charges $OT Visit: 1 Procedure OT Evaluation $OT Eval High Complexity: 1 Procedure OT Treatments $Self Care/Home Management : 23-37 mins  Agostino Gorin OTR/L 11/02/2015, 4:58 PM

## 2015-11-02 NOTE — Progress Notes (Signed)
[facility] Progress Note  Assessment/Plan: 1. Sepsis  Yeast UTI vs other, hx HCAP d/c 1/9 - still febrile 101; has right IJ TDC; no growth on BC so far, on Vanc, meropenem and diflucan 2. ESRD -  MWF - HD Wed 3. Anemia - Hgb variable - 12>8.5> 10.2 - no ESA , weekly low dose Fe, check CBC daily -pending 4. Secondary hyperparathyroidism - P 5.8 - on one phoslo ac; resume Hectorol 1 ac 5. HTN/volume - BP meds on hold; BP low 100s net UF 2 L Monday with post wt of 70.2, titrate volume down 6. Nutrition - renal diet/vitamin/nepro alb 1.7 7. ^ LFTS - trending up - daily CMP ordered - follow; on statin 8. DM - off meds - per primary 9. Dementia -I don't know his baseline; admitting PA-C yesterday said he was about baseline; given increasing LFTS - might need to check NH3 level 10. Chronic afib - on coumadin - ^ upon admission - still high at 3.92 11. Hypothyroidism -TSH 3.1 1/6 12. Thrombocytopenia- follow  Sheffield Slider, PA-C [facility] Beeper [phone] 11/02/2015,9:32 AM  LOS: 2 days    Renal Attending: I had discussion with sons.  They are concerned about post dialysis fatigue and weakness.  No obvious edema.  Will keep even in AM. Payzlee Ryder C   Subjective:   Thinks he is at home, then says [place], the hospital, cannot name president, c/o feet burning "don't touch" Ate very little of breakfast.  Objective Filed Vitals:   11/01/15 1830 11/01/15 1900 11/01/15 2137 11/02/15 0439  BP: 99/60 103/50 106/56 109/58  Pulse: 90 68 92 105  Temp:  97.1 F (36.2 C) 99.9 F (37.7 C) 101 F (38.3 C)  TempSrc:  Oral Oral Oral  Resp: Height:      Weight:   70.2 kg (154 lb 12.2 oz)   SpO2:   98% 97%   Physical Exam General: NAD on room air Heart: RRR Lungs: grossly clear Abdomen: soft NT Extremities: no LE edema or foot wounds, tender to touch Dialysis Access: right IJ and left upper AVF + bruit - no evidence of infection  Dialysis  Orders: Center: [facility] on MWF . EDW 72.5 HD Bath 2k,2.25ca Time 4hr Heparin 1500. Access RIJ perm cath / Lua avf ( insert 09/10/15)  Hect. 1.0 mcg IV/HD Units IV/HD Venofer  bolus Last dose 11/01/15 needs  q wkk  Other op lab hgb 11.2 / ca 9.9 phos 3.6 TFS 13% PTH 297  Additional Objective Labs: Lab Results  Component Value Date   INR 3.92* 11/02/2015   INR 4.80* 11/01/2015   INR 4.07* 10/31/2015    Basic Metabolic Panel:  Recent Labs Lab 10/31/15 1108 11/01/15 0212 11/02/15 0644  NA 137 135 137  K 4.6 3.8 3.9  CL 99* 100* 98*  CO2 GLUCOSE 73 271* 82  BUN 48* 56* 21*  CREATININE 6.78* 7.84* 4.72*  CALCIUM 8.8* 8.2* 8.1*  PHOS 5.8*  --   --    Liver Function Tests:  Recent Labs Lab 10/30/15 2357 10/31/15 1108 11/01/15 0212 11/02/15 0644  AST 55*  --  105* 133*  ALT 8*  --  16* 25  ALKPHOS 99  --  115 132*  BILITOT 0.6  --  0.4 0.8  PROT 7.3  --  5.9* 6.6  ALBUMIN 2.3* 1.9* 1.7* 1.8*   CBC:  Recent Labs Lab 10/30/15 2357 10/31/15 0345 11/01/15 1478  WBC 6.8 6.3 6.8  NEUTROABS 4.4  --  4.5  HGB 12.0* 8.5* 10.2*  HCT 38.6* 28.5* 31.9*  MCV 95.3 95.3 93.3  PLT 165 97* 119*   Blood Culture    Component Value Date/Time   SDES URINE, CATHETERIZED 10/31/2015 0202   SPECREQUEST NONE 10/31/2015 0202   CULT >=100,000 COLONIES/mL YEAST 10/31/2015 0202   REPTSTATUS 11/01/2015 FINAL 10/31/2015 0202    Cardiac Enzymes:  Recent Labs Lab 10/31/15 0834 10/31/15 1410 10/31/15 2010 11/01/15 0212 11/01/15 1226  TROPONINI 0.58* 0.56* 0.39* 0.36* 0.25*   CBG:  Recent Labs Lab 11/01/15 0449 11/01/15 0742 11/01/15 1208 11/01/15 2136 11/02/15 0746  GLUCAP 253* 164* 159* 145* 90  Medications:   . calcium acetate  667 mg Oral TID WC  . vitamin B-12  500 mcg Oral Daily  . [START ON 11/03/2015] doxercalciferol  1 mcg Intravenous Q M,W,F-HD  . feeding supplement (NEPRO CARB STEADY)  237 mL Oral BID BM  . fluconazole  (DIFLUCAN) IV  200 mg Intravenous Q24H  . insulin aspart  0-9 Units Subcutaneous TID WC  . levothyroxine  175 mcg Oral QAC breakfast  . meropenem (MERREM) IV  500 mg Intravenous Q24H  . multivitamin  1 tablet Oral Daily  . rosuvastatin  20 mg Oral Daily  . sodium chloride  3 mL Intravenous Q12H  . [START ON 11/03/2015] vancomycin  750 mg Intravenous Q M,W,F-HD  . Warfarin - Pharmacist Dosing Inpatient   Does not apply [phone]

## 2015-11-02 NOTE — Progress Notes (Signed)
[facility]  Patient Status: Active (receiving services up to time of hospitalization)  [facility] is providing the following services: RN, PT and MSW  If patient discharges after hours, please call [phone].   [name] [name] 11/02/2015, 5:16 PM

## 2015-11-02 NOTE — Progress Notes (Signed)
ANTICOAGULATION CONSULT NOTE - Follow Up Consult  Pharmacy Consult for warfarin Indication: atrial fibrillation  Allergies  Allergen Reactions  . Tomato Other (See Comments)    Too much acid - cannot take due to kidney issues    Patient Measurements: Height:  (175.3 cm) Weight: 154 lb 12.2 oz (70.2 kg) IBW/kg (Calculated) : 70.7   Vital Signs: Temp: 100.6 F (38.1 C) (01/17 0944) Temp Source: Oral (01/17 0944) BP: 107/52 mmHg (01/17 0944) Pulse Rate: 108 (01/17 0944)  Labs:  Recent Labs  10/31/15 0345  10/31/15 1108  10/31/15 2010 11/01/15 0212 11/01/15 1226 11/02/15 0644  HGB 8.5*  --   --   --   --  10.2*  --  10.6*  HCT 28.5*  --   --   --   --  31.9*  --  34.9*  PLT 97*  --   --   --   --  119*  --  PENDING  LABPROT 38.5*  --   --   --   --  43.6*  --  37.5*  INR 4.07*  --   --   --   --  4.80*  --  3.92*  CREATININE 4.28*  < > 6.78*  --   --  7.84*  --  4.72*  TROPONINI 0.46*  < >  --   < > 0.39* 0.36* 0.25*  --   < > = values in this interval not displayed.  Estimated Creatinine Clearance: 13 mL/min (by C-G formula based on Cr of 4.72).   Assessment: [name] recently admitted, back again with concern for sepsis. To continue warfarin for AFib. Home dose  daily except 7.5mg  on Mondays and Fridays. INR on admit therapeutic at 2.64- last dose 1/14 PTA. INR coming down but still elevated at 3.92  hgb 10.6, plts 119- improved. No bleeding noted.  Goal of Therapy:  INR 2-3 Monitor platelets by anticoagulation protocol: Yes   Plan:  -hold warfarin -daily INR, CBC -follow s/s bleeding   Wafa Martes D. Nicholette Dolson, PharmD, BCPS Clinical Pharmacist Pager: [phone] 11/02/2015 10:09 AM

## 2015-11-02 NOTE — Progress Notes (Addendum)
[name] [name] ZOX:[id number] DOB: 04-28-1936 DOA: 10/30/2015 PCP: [name], MD   Team 1 transfer 1/17  Admit HPI / Brief Narrative: 80 y.o. M Hx CAD, HTN, HLD, CHF, ESRD dialized M/W/F, hypothyroidism, DM2, and a HCAP admission w/ discharge 1/9 on fortaz and vanc with dialysis (last dose 1/13) who was brought in by family with confusion, and decreased appetitie for a couple of days. In the ED he was febrile to 102.6. He was given zosyn, vanc, and IVF, and his mental status improved.  HPI/Subjective: Feels ok, " when can i go home", my legs are hot"  Assessment/Plan:  Sepsis of unclear etiology -improving on current Abx -Has a hemodialysis catheter which could potentially be the source, continue IV vancomycin, meropenem, fluconazole  urine culture with greater than 100,000 colonies of yeast  - temp of 101 as of this a.m.  -Follow-up blood cultures   Suspect mild dementia versus metabolic encephalopathy in the setting of above -monitor,  quite appropriate this a.m.  ESRD on HD M/W/F HD fistula placed 09/10/15 in L UE w/ good thrill to palpation -HD catheter and right chest, follow-up blood cultures, HD per renal  Warfarin induced coagulopathy Dosing per Rx - follow   Mild AST elevation  -Likely due to sepsis, stable, monitor   Chronic Afib w/ acute RVR  CHADS-VASc 8 - on chronic warfarin - rate controlled   Chronic systolic CHF  Volume management per HD   Elevated troponin - CAD s/p PCI  RCA  Trop peaked at 0.56 - most c/w demand ischemia/rate related - no chest pain   DM2 -CBGs 92 -164 stable, SSI   HTN BP currently well controlled   Hypothyroidism s/p thyroidectomy  Cont home synthroid dose   Code Status: FULL Family Communication: no family present at time of exam Disposition Plan: Home versus SNF, pending workup  Consultants: Nephrology   Procedures: 1/8 TTE - EF 40-45% - hypokinesis anteroseptal myocardium - mild mitral valve regurg - LA mildly  dilated - RA mod dilated - severe TR - PA pressure 39mm Hg  Antibiotics: Zosyn (prior to admit)  Vanc (prior to admit) > Meropenem 1/15 >  DVT prophylaxis: warfarin  Objective: Blood pressure 107/52, pulse 108, temperature 100.6 F (38.1 C), temperature source Oral, resp. rate 18, height  (1.753 m), weight 70.2 kg (154 lb 12.2 oz), SpO2 96 %.  Intake/Output Summary (Last 24 hours) at 11/02/15 1413 Last data filed at 11/02/15 0944  Gross per 24 hour  Intake    750 ml  Output   2000 ml  Net  -1250 ml   Exam: General: alert and conversant , oriented to self only Lungs: Clear to auscultation bilaterally  Cardiovascular: Regular rate and without murmur gallop or rub normal S1 and S2 Abdomen: Nontender, nondistended, soft, bowel sounds positive, Extremities: No significant cyanosis, clubbing, or edema bilateral lower extremities   Data Reviewed:  Basic Metabolic Panel:  Recent Labs Lab 10/31/15 0345 10/31/15 0834 10/31/15 1108 11/01/15 0212 11/02/15 0644  NA 140 137 137 135 137  K 2.5* 4.3 4.6 3.8 3.9  CL 112* 100* 99* 100* 98*  CO2 17* GLUCOSE 104* 69 73 271* 82  BUN 31* 44* 48* 56* 21*  CREATININE 4.28* 6.45* 6.78* 7.84* 4.72*  CALCIUM 5.8* 8.7* 8.8* 8.2* 8.1*  MG  --   --   --  2.1  --   PHOS  --   --  5.8*  --   --  CBC:  Recent Labs Lab 10/30/15 2357 10/31/15 0345 11/01/15 0212 11/02/15 0644  WBC 6.8 6.3 6.8 5.4  NEUTROABS 4.4  --  4.5 3.0  HGB 12.0* 8.5* 10.2* 10.6*  HCT 38.6* 28.5* 31.9* 34.9*  MCV 95.3 95.3 93.3 95.4  PLT 165 97* 119* 122*   Liver Function Tests:  Recent Labs Lab 10/30/15 2357 10/31/15 1108 11/01/15 0212 11/02/15 0644  AST 55*  --  105* 133*  ALT 8*  --  16* 25  ALKPHOS 99  --  115 132*  BILITOT 0.6  --  0.4 0.8  PROT 7.3  --  5.9* 6.6  ALBUMIN 2.3* 1.9* 1.7* 1.8*   Coags:  Recent Labs Lab 10/30/15 2357 10/31/15 0345 11/01/15 0212 11/02/15 0644  INR 2.64* 4.07* 4.80* 3.92*   Cardiac  Enzymes:  Recent Labs Lab 10/31/15 0834 10/31/15 1410 10/31/15 2010 11/01/15 0212 11/01/15 1226  TROPONINI 0.58* 0.56* 0.39* 0.36* 0.25*    CBG:  Recent Labs Lab 11/01/15 0742 11/01/15 1208 11/01/15 2136 11/02/15 0746 11/02/15 1120  GLUCAP 164* 159* 145* 90 122*    Recent Results (from the past 240 hour(s))  MRSA PCR Screening     Status: None   Collection Time: 10/23/15  7:06 PM  Result Value Ref Range Status   MRSA by PCR NEGATIVE NEGATIVE Final    Comment:        The GeneXpert MRSA Assay (FDA approved for NASAL specimens only), is one component of a comprehensive MRSA colonization surveillance program. It is not intended to diagnose MRSA infection nor to guide or monitor treatment for MRSA infections.   Blood Culture (routine x 2)     Status: None (Preliminary result)   Collection Time: 10/31/15 12:45 AM  Result Value Ref Range Status   Specimen Description BLOOD RIGHT HAND  Final   Special Requests IN PEDIATRIC BOTTLE 3CC  Final   Culture NO GROWTH 2 DAYS  Final   Report Status PENDING  Incomplete  Blood Culture (routine x 2)     Status: None (Preliminary result)   Collection Time: 10/31/15 12:49 AM  Result Value Ref Range Status   Specimen Description BLOOD LEFT HAND  Final   Special Requests IN PEDIATRIC BOTTLE 4CC  Final   Culture NO GROWTH 2 DAYS  Final   Report Status PENDING  Incomplete  Urine culture     Status: None   Collection Time: 10/31/15  2:02 AM  Result Value Ref Range Status   Specimen Description URINE, CATHETERIZED  Final   Special Requests NONE  Final   Culture >=100,000 COLONIES/mL YEAST  Final   Report Status 11/01/2015 FINAL  Final    Scheduled Meds:  Scheduled Meds: . calcium acetate  667 mg Oral TID WC  . vitamin B-12  500 mcg Oral Daily  . [START ON 11/03/2015] doxercalciferol  1 mcg Intravenous Q M,W,F-HD  . feeding supplement (NEPRO CARB STEADY)  237 mL Oral BID BM  . fluconazole (DIFLUCAN) IV  200 mg Intravenous Q24H   . insulin aspart  0-9 Units Subcutaneous TID WC  . levothyroxine  175 mcg Oral QAC breakfast  . meropenem (MERREM) IV  500 mg Intravenous Q24H  . multivitamin  1 tablet Oral Daily  . rosuvastatin  20 mg Oral Daily  . sodium chloride  3 mL Intravenous Q12H  . [START ON 11/03/2015] vancomycin  750 mg Intravenous Q M,W,F-HD  . Warfarin - Pharmacist Dosing Inpatient   Does not apply q1800    Time  spent on care of this patient: 35 mins   Zannie Cove , MD   [facility] Office  [phone]  If 7PM-7AM, please contact night-coverage [email or web address] Password [email or web address] 11/02/2015, 2:13 PM   LOS: 2 days

## 2015-11-02 NOTE — Evaluation (Signed)
Physical Therapy Evaluation Patient Details Name: [name] [name] MRN: [id number] DOB: November 06, 1935 Today's Date: 11/02/2015   History of Present Illness  80 y.o. M Hx CAD, HTN, HLD, CHF, ESRD dialized M/W/F, hypothyroidism, DM2, and a HCAP admission w/ discharge 1/9 on fortaz and vanc with dialysis (last dose 1/13) who was brought in by family with confusion, and decreased appetitie for a couple of days. In the ED he was febrile to 102.6.  Clinical Impression  Pt with the above medical history presents today well below his baseline. Pt unable to follow one step command or verbalize with therapist. Spoke with daughter to gain all pertinent information before mobilizing patient. Patient requiring Total A x 2 for sit to stand attempt from chair. All LE exercises were performed as PROM. Recommending SNF once medical stable to maximize patients recovery towards his modified independence at baseline. Daughter agrees with plan of care and discharge plan.    Follow Up Recommendations SNF    Equipment Recommendations  None recommended by PT    Recommendations for Other Services       Precautions / Restrictions Precautions Precautions: Fall Restrictions Weight Bearing Restrictions: No      Mobility  Bed Mobility Overal bed mobility: Needs Assistance Bed Mobility: Supine to Sit     Supine to sit: +2 for physical assistance;Total assist     General bed mobility comments: Pt in chair throughout session  Transfers Overall transfer level: Needs assistance Equipment used: 2 person hand held assist Transfers: Sit to/from Stand Sit to Stand: Total assist;+2 physical assistance         General transfer comment: Pt assisted very little. PT positioned feet and Total A x 2 to get to 50% upright. Excessive hip flexion and kyphotic spine  Ambulation/Gait                Stairs            Wheelchair Mobility    Modified Rankin (Stroke Patients Only)       Balance      Sitting balance-Leahy Scale: Poor       Standing balance-Leahy Scale: Zero                               Pertinent Vitals/Pain Pain Assessment: Faces Faces Pain Scale: Hurts even more Pain Location: B LE, moving of the LEs Pain Descriptors / Indicators: Grimacing Pain Intervention(s): Monitored during session    Home Living Family/patient expects to be discharged to:: Skilled nursing facility Living Arrangements: Children;Spouse/significant other Available Help at Discharge: Family;Available 24 hours/day Type of Home: House Home Access: Stairs to enter Entrance Stairs-Rails: Left Entrance Stairs-Number of Steps: 2 Home Layout: One level Home Equipment: Cane - quad;Hand held Careers information officer - 2 wheels Additional Comments: All home information, PLF, and discharge plan discussed with daughter in room    Prior Function Level of Independence: Independent with assistive device(s)         Comments: Walker to ambulate     Hand Dominance   Dominant Hand: Right    Extremity/Trunk Assessment   Upper Extremity Assessment: Difficult to assess due to impaired cognition (pts grip on R armrest shows at least 3/5 strength)           Lower Extremity Assessment: Difficult to assess due to impaired cognition (unable to stand)      Cervical / Trunk Assessment: Kyphotic  Communication   Communication: Expressive difficulties;Receptive difficulties  Cognition Arousal/Alertness: Lethargic Behavior During Therapy: Flat affect Overall Cognitive Status: Impaired/Different from baseline Area of Impairment: Orientation;Attention;Memory;Following commands;Awareness Orientation Level:  (pt non-verbal throughout evaluation) Current Attention Level: Focused           General Comments: Pt did not follow any one step commands during session or make any verbal communication    General Comments      Exercises General Exercises - Lower Extremity Ankle  Circles/Pumps: PROM;Both;Seated Long Arc Quad: PROM;Both;5 reps;Seated      Assessment/Plan    PT Assessment Patient needs continued PT services  PT Diagnosis Abnormality of gait;Difficulty walking;Generalized weakness;Altered mental status   PT Problem List Decreased strength;Decreased range of motion;Decreased activity tolerance;Decreased balance;Decreased mobility;Decreased coordination;Decreased cognition;Decreased safety awareness;Pain  PT Treatment Interventions DME instruction;Gait training;Stair training;Functional mobility training;Therapeutic activities;Therapeutic exercise;Balance training;Patient/family education   PT Goals (Current goals can be found in the Care Plan section) Acute Rehab PT Goals Patient Stated Goal: Pt did not state PT Goal Formulation: Patient unable to participate in goal setting Time For Goal Achievement: 11/15/15 Potential to Achieve Goals: Fair    Frequency Min 2X/week   Barriers to discharge Inaccessible home environment;Decreased caregiver support      Co-evaluation               End of Session Equipment Utilized During Treatment: Gait belt Activity Tolerance: Treatment limited secondary to medical complications (Comment) Patient left: in chair;with call bell/phone within reach;with bed alarm set Nurse Communication: Mobility status         Time: 6045-4098 PT Time Calculation (min) (ACUTE ONLY): 17 min   Charges:   PT Evaluation $PT Eval Moderate Complexity: 1 Procedure     PT G Codes:        Ulyses Jarred 2015/11/07, 4:52 PM  Ulyses Jarred, Student Physical Therapist Acute Rehab [phone]

## 2015-11-02 NOTE — Progress Notes (Signed)
Patient noticed as having AMS when PT was working with patient. Patient became less alert and could not answer questions both the PT and RN asked. paitent had an elevated axillary temp of 101.1. MD notified and also reordered PO tylenol as PR. MD also requested that he not eat or drink for the time being. Rectal temp was taken at 103.9. tylenol given per rectum. Patient is now back in bed with the heat turned down and the window slightly open to cool down the room. A cool wash cloth is also placed on the patient's forehead. Will continue to monitor and will recheck temp in about one hour.  Stark Klein, RN 11/02/2015

## 2015-11-02 NOTE — Progress Notes (Signed)
PT Cancellation Note  Patient Details Name: [name] [name] MRN: [id number] DOB: 1936/07/18   Cancelled Treatment:    Reason Eval/Treat Not Completed: Fatigue/lethargy limiting ability to participate. Pt difficult to arouse and unable to maintain eye opening. PT to return as able to complete PT eval.   Marcene Brawn 11/02/2015, 10:37 AM  Lewis Shock, PT, DPT Pager #: [phone] Office #: [phone]

## 2015-11-02 NOTE — Clinical Documentation Improvement (Signed)
Internal Medicine Nephrology/Renal  Can the diagnosis of anemia be further specified? Please document findings in next progress note; NOT in BPA drop down box. Thanks!   Iron deficiency Anemia  Nutritional anemia, including the nutrition or mineral deficits  Chronic Blood Loss Anemia, including the suspected or known cause  Anemia of chronic disease, including the associated chronic disease state  Other  Clinically Undetermined  Document any associated diagnoses/conditions.  Supporting Information:  Renal documents: no ESA, weekly low dose Fe, check CBC daily  Please exercise your independent, professional judgment when responding. A specific answer is not anticipated or expected.  Thank You,  Shellee Milo RN, BSN, CCDS Health Information Management [place] [phone]; Cell: [phone]

## 2015-11-03 ENCOUNTER — Inpatient Hospital Stay (HOSPITAL_COMMUNITY): Payer: Medicare Other

## 2015-11-03 DIAGNOSIS — R509 Fever, unspecified: Secondary | ICD-10-CM | POA: Insufficient documentation

## 2015-11-03 LAB — COMPREHENSIVE METABOLIC PANEL
ALK PHOS: 164 U/L — AB (ref 38–126)
ALT: 33 U/L (ref 17–63)
ANION GAP: 14 (ref 5–15)
AST: 171 U/L — ABNORMAL HIGH (ref 15–41)
Albumin: 1.7 g/dL — ABNORMAL LOW (ref 3.5–5.0)
BUN: 38 mg/dL — ABNORMAL HIGH (ref 6–20)
CALCIUM: 8.5 mg/dL — AB (ref 8.9–10.3)
CO2: 25 mmol/L (ref 22–32)
Chloride: 97 mmol/L — ABNORMAL LOW (ref 101–111)
Creatinine, Ser: 6.14 mg/dL — ABNORMAL HIGH (ref 0.61–1.24)
GFR calc non Af Amer: 8 mL/min — ABNORMAL LOW (ref >60)
GFR, EST AFRICAN AMERICAN: 9 mL/min — AB (ref >60)
Glucose, Bld: 229 mg/dL — ABNORMAL HIGH (ref 65–99)
POTASSIUM: 4.2 mmol/L (ref 3.5–5.1)
SODIUM: 136 mmol/L (ref 135–145)
Total Bilirubin: 0.6 mg/dL (ref 0.3–1.2)
Total Protein: 6.4 g/dL — ABNORMAL LOW (ref 6.5–8.1)

## 2015-11-03 LAB — CBC
HCT: 33.6 % — ABNORMAL LOW (ref 39.0–52.0)
Hemoglobin: 10.2 g/dL — ABNORMAL LOW (ref 13.0–17.0)
MCH: 28.6 pg (ref 26.0–34.0)
MCHC: 30.4 g/dL (ref 30.0–36.0)
MCV: 94.1 fL (ref 78.0–100.0)
PLATELETS: 123 10*3/uL — AB (ref 150–400)
RBC: 3.57 MIL/uL — ABNORMAL LOW (ref 4.22–5.81)
RDW: 17.9 % — AB (ref 11.5–15.5)
WBC: 6.1 10*3/uL (ref 4.0–10.5)

## 2015-11-03 LAB — GLUCOSE, CAPILLARY
GLUCOSE-CAPILLARY: 111 mg/dL — AB (ref 65–99)
GLUCOSE-CAPILLARY: 129 mg/dL — AB (ref 65–99)
Glucose-Capillary: 174 mg/dL — ABNORMAL HIGH (ref 65–99)
Glucose-Capillary: 105 mg/dL — ABNORMAL HIGH (ref 65–99)

## 2015-11-03 LAB — PHOSPHORUS: PHOSPHORUS: 4 mg/dL (ref 2.5–4.6)

## 2015-11-03 LAB — LACTIC ACID, PLASMA: LACTIC ACID, VENOUS: 1.3 mmol/L (ref 0.5–2.0)

## 2015-11-03 LAB — PROTIME-INR
INR: 5.1 — AB (ref 0.00–1.49)
PROTHROMBIN TIME: 45.6 s — AB (ref 11.6–15.2)

## 2015-11-03 MED ORDER — HEPARIN SODIUM (PORCINE) 1000 UNIT/ML DIALYSIS: 1000.0000 [IU] | INTRAMUSCULAR | Status: DC | PRN

## 2015-11-03 MED ORDER — PENTAFLUOROPROP-TETRAFLUOROETH EX AERO: 1.0000 "application " | INHALATION_SPRAY | CUTANEOUS | Status: DC | PRN

## 2015-11-03 MED ORDER — LIDOCAINE HCL (PF) 1 % IJ SOLN: 5.0000 mL | INTRAMUSCULAR | Status: DC | PRN

## 2015-11-03 MED ORDER — SODIUM CHLORIDE 0.9 % IV SOLN: 100.0000 mL | INTRAVENOUS | Status: DC | PRN

## 2015-11-03 MED ORDER — ALTEPLASE 2 MG IJ SOLR: 2.0000 mg | Freq: Once | INTRAMUSCULAR | Status: DC | PRN

## 2015-11-03 MED ORDER — IOHEXOL 300 MG/ML  SOLN
25.0000 mL | INTRAMUSCULAR | Status: AC
  Administered 2015-11-03: 25 mL via ORAL

## 2015-11-03 MED ORDER — DOXERCALCIFEROL 4 MCG/2ML IV SOLN
INTRAVENOUS | Status: AC
  Filled 2015-11-03: qty 2

## 2015-11-03 MED ORDER — HEPARIN SODIUM (PORCINE) 1000 UNIT/ML DIALYSIS: 20.0000 [IU]/kg | INTRAMUSCULAR | Status: DC | PRN

## 2015-11-03 MED ORDER — LIDOCAINE-PRILOCAINE 2.5-2.5 % EX CREA: 1.0000 "application " | TOPICAL_CREAM | CUTANEOUS | Status: DC | PRN

## 2015-11-03 MED ORDER — CARVEDILOL 3.125 MG PO TABS
3.1250 mg | ORAL_TABLET | Freq: Two times a day (BID) | ORAL | Status: DC
  Administered 2015-11-03 – 2015-11-04 (×2): 3.125 mg via ORAL
  Filled 2015-11-03 (×2): qty 1

## 2015-11-03 NOTE — Progress Notes (Signed)
Late entry:  Earlier in shift, rapid response RN, Brooke, spoke to this RN regarding patient's orientation status and constant fevers.  Brooke requested RN to obtain vital signs with rectal temperature around 0000 and to notify with results.  Rectal temperature was 101.2 F.  All other VS normal.  Notified Brooke, who requested RN to page NP on-call to suggest a lactic acid draw.  NP made aware.  New orders received.  Will continue to monitor.

## 2015-11-03 NOTE — Procedures (Signed)
Feels well.  Low grade temp persists.  Will keep even as sons concerned about malaise and difficulty with weakness post treatment. Currently hemodynamically stable. Aws Shere C

## 2015-11-03 NOTE — Progress Notes (Signed)
PROGRESS NOTE  JOSEPH BIAS WUJ:[id number] DOB: 10/05/1936 DOA: 10/30/2015 PCP: [name], MD   Admit HPI / Brief Narrative: 80 y.o. M Hx CAD, HTN, HLD, CHF, ESRD dialized M/W/F, hypothyroidism, DM2, and a [facility] admission w/ discharge 1/9 on fortaz and vanc with dialysis (last dose 1/13) who was brought in by family with confusion, and decreased appetitie for a couple of days. In the ED he was febrile to 102.6. He was given zosyn, vanc, and IVF, and his mental status improved.  HPI/Subjective: Feels ok, " when can i go home", my legs are hot"  Assessment/Plan:  Sepsis of unclear etiology -improving on current Abx -Has a hemodialysis catheter which could potentially be the source, continue IV vancomycin, meropenem, fluconazole  urine culture with greater than 100,000 colonies of yeast-->unclear clinical significance -Follow-up blood cultures--neg to date -CT abd/pelvis -CT chest  metabolic encephalopathy in the setting of above/?underlying cognitive impairment -due to fever ?infectious process  Left Knee pain -check uric acid -MR left knee--?significant effusion to tap--hesitate to tap blindly in pt on warfarin  ESRD on HD M/W/F HD fistula placed 09/03/15 in L UE w/ good thrill to palpation -HD catheter and right chest, follow-up blood cultures, HD per renal -appreciate nephrology Warfarin induced coagulopathy Dosing per Rx - follow   Mild AST elevation  -Likely due to sepsis, stable, monitor   Chronic Afib w/ acute RVR  CHADS-VASc 8 - on chronic warfarin - rate controlled  -10/24/15--Echo EF 40-45%, severe TR -Restart carvedilol  Chronic systolic CHF  Volume management per HD  -10/24/15--Echo EF 40-45%, severe TR -Restart carvedilol lower dose  Elevated troponin - CAD s/p PCI RCA  Trop peaked at 0.56 - most c/w demand ischemia/rate related - no chest pain   DM2 -Hemoglobin A1c--5.9 -Patient normally takes glipizide at home -Novo[employer] sliding  scale  HTN BP currently well controlled    Hypothyroidism s/p thyroidectomy  Cont home synthroid dose   Anemia of CKD -B12 marginally low -supplement Code Status: FULL Family Communication: no family present at time of exam Disposition Plan: Home versus SNF, pending workup  Consultants: Nephrology   Procedures: 1/8 TTE - EF 40-45% - hypokinesis anteroseptal myocardium - mild mitral valve regurg - LA mildly dilated - RA mod dilated - severe TR - PA pressure 39mm Hg  Antibiotics: Zosyn (prior to admit)  Vanc (prior to admit) > Meropenem 1/15 >  DVT prophylaxis: warfarin   Procedures/Studies: Dg Chest 2 View  10/24/2015  CLINICAL DATA:  Nonproductive cough, hypoxemia. EXAM: CHEST  2 VIEW COMPARISON:  Chest x-rays dated 10/20/2015 and 09/04/2015. FINDINGS: Fairly severe cardiomegaly is unchanged. Right chest wall dual lumen catheter is stable in position with tip projected over the right atrium. Central pulmonary vascular congestion and mild bilateral interstitial edema is unchanged, suspected to be chronic. New confluent airspace opacity is seen at the right lung base. IMPRESSION: 1. New confluent airspace opacity at the right lung base, pneumonia versus aspiration. 2. Stable cardiomegaly. The mild central pulmonary vascular congestion and bilateral interstitial edema is not significantly changed, most likely related to a chronic mild CHF. Electronically Signed   By: Bary Richard M.D.   On: 10/24/2015 11:10   Dg Chest 2 View  10/20/2015  CLINICAL DATA:  Acute onset of altered mental status. Tachycardia. Initial encounter. EXAM: CHEST  2 VIEW COMPARISON:  Chest radiograph performed 09/04/2015 FINDINGS: The lungs are well-aerated. Vascular congestion is noted. A small left pleural  effusion is suspected. Increased interstitial markings likely reflect pulmonary edema. There is no evidence of pneumothorax. The heart is enlarged. No acute osseous abnormalities are seen. A right-sided  dual-lumen catheter is noted ending about the cavoatrial junction. IMPRESSION: Vascular congestion and cardiomegaly. Small left pleural effusion suspected. Increased interstitial markings likely reflect pulmonary edema. Electronically Signed   By: [name] M.D.   On: 10/20/2015 22:00   Ct Head Wo Contrast  10/20/2015  CLINICAL DATA:  Altered mental status EXAM: CT HEAD WITHOUT CONTRAST TECHNIQUE: Contiguous axial images were obtained from the base of the skull through the vertex without intravenous contrast. COMPARISON:  Head CT July 19, 2015; brain MRI July 20, 2015 FINDINGS: There is stable age related volume loss. There is no intracranial mass, hemorrhage, extra-axial fluid collection, or midline shift. There is stable slight small vessel disease in the centra semiovale bilaterally. Elsewhere gray-white compartments are normal. No acute infarct is evident. Calcification along the falx is stable. Bony calvarium appears intact. The mastoid air cells are clear. No intraorbital lesions are identified. IMPRESSION: Slight periventricular small vessel disease, stable. No intracranial mass, hemorrhage, or new gray - white compartment lesions/acute appearing infarct. Stable appearance compared to prior studies. Electronically Signed   By: Bretta Bang III M.D.   On: 10/20/2015 21:26   Dg Chest Port 1 View  10/30/2015  CLINICAL DATA:  Acute onset of shortness of breath. Initial encounter. EXAM: PORTABLE CHEST 1 VIEW COMPARISON:  Chest radiograph performed 10/24/2015 FINDINGS: The patient's right-sided dual-lumen catheter is noted ending overlying the right atrium. The lungs are well-aerated. Vascular congestion is noted. Increased interstitial markings raise concern for mild interstitial edema. A small left pleural effusion may be present. No pneumothorax is seen. The cardiomediastinal silhouette is mildly enlarged. No acute osseous abnormalities are seen. IMPRESSION: Vascular congestion and mild  cardiomegaly. Increased interstitial markings raise concern for mild interstitial edema. Small left pleural effusion may be present. Electronically Signed   By: [name] M.D.   On: 10/30/2015 23:54         Subjective: Patient is awake and alert but a little confused. Denies any headache, chest pain, shortness breath, vomiting, diarrhea, abdominal pain. Complains of left greater than right knee pain. Denies any recent injury.   Objective: Filed Vitals:   11/03/15 1130 11/03/15 1200 11/03/15 1205 11/03/15 1229  BP: 119/76 122/68 129/78   Pulse: 108 106 109   Temp:   97.5 F (36.4 C) 99.7 F (37.6 C)  TempSrc:   Oral Axillary  Resp: Height:      Weight:   71.3 kg (157 lb 3 oz)   SpO2:   100%     Intake/Output Summary (Last 24 hours) at 11/03/15 1545 Last data filed at 11/03/15 1205  Gross per 24 hour  Intake    220 ml  Output      1 ml  Net    219 ml   Weight change: 0.6 kg (1 lb 5.2 oz) Exam:   General:  Pt is alert, follows commands appropriately, not in acute distress  HEENT: No icterus, No thrush, No neck mass, [place]/AT  Cardiovascular: RRR, S1/S2, no rubs, no gallops  Respiratory: Bibasilar crackles, left greater than right. No wheezing, no wheezing, no crackles, no rhonchi  Abdomen: Soft/+BS, non tender, non distended, no guarding; no hepatosplenomegaly  Extremities: No edema, No lymphangitis, No petechiae, No rashes, ?synovitis left knee  Data Reviewed: Basic Metabolic Panel:  Recent Labs Lab 10/31/15  9604 10/31/15 1108 11/01/15 0212 11/02/15 0644 11/03/15 0122 11/03/15 0900  NA 137 137 135 137 136  --   K 4.3 4.6 3.8 3.9 4.2  --   CL 100* 99* 100* 98* 97*  --   CO2 --   GLUCOSE 69 73 271* 82 229*  --   BUN 44* 48* 56* 21* 38*  --   CREATININE 6.45* 6.78* 7.84* 4.72* 6.14*  --   CALCIUM 8.7* 8.8* 8.2* 8.1* 8.5*  --   MG  --   --  2.1  --   --   --   PHOS  --  5.8*  --   --   --  4.0   Liver Function  Tests:  Recent Labs Lab 10/30/15 2357 10/31/15 1108 11/01/15 0212 11/02/15 0644 11/03/15 0122  AST 55*  --  105* 133* 171*  ALT 8*  --  16* 25 33  ALKPHOS 99  --  115 132* 164*  BILITOT 0.6  --  0.4 0.8 0.6  PROT 7.3  --  5.9* 6.6 6.4*  ALBUMIN 2.3* 1.9* 1.7* 1.8* 1.7*   No results for input(s): LIPASE, AMYLASE in the last 168 hours. No results for input(s): AMMONIA in the last 168 hours. CBC:  Recent Labs Lab 10/30/15 2357 10/31/15 0345 11/01/15 0212 11/02/15 0644 11/03/15 0122  WBC 6.8 6.3 6.8 5.4 6.1  NEUTROABS 4.4  --  4.5 3.0  --   HGB 12.0* 8.5* 10.2* 10.6* 10.2*  HCT 38.6* 28.5* 31.9* 34.9* 33.6*  MCV 95.3 95.3 93.3 95.4 94.1  PLT 165 97* 119* 122* 123*   Cardiac Enzymes:  Recent Labs Lab 10/31/15 0834 10/31/15 1410 10/31/15 2010 11/01/15 0212 11/01/15 1226  TROPONINI 0.58* 0.56* 0.39* 0.36* 0.25*   BNP: Invalid input(s): POCBNP CBG:  Recent Labs Lab 11/02/15 1120 11/02/15 1631 11/02/15 2140 11/03/15 0736 11/03/15 1227  GLUCAP 122* 100* 227* 174* 111*    Recent Results (from the past 240 hour(s))  Blood Culture (routine x 2)     Status: None (Preliminary result)   Collection Time: 10/31/15 12:45 AM  Result Value Ref Range Status   Specimen Description BLOOD RIGHT HAND  Final   Special Requests IN PEDIATRIC BOTTLE 3CC  Final   Culture NO GROWTH 3 DAYS  Final   Report Status PENDING  Incomplete  Blood Culture (routine x 2)     Status: None (Preliminary result)   Collection Time: 10/31/15 12:49 AM  Result Value Ref Range Status   Specimen Description BLOOD LEFT HAND  Final   Special Requests IN PEDIATRIC BOTTLE 4CC  Final   Culture NO GROWTH 3 DAYS  Final   Report Status PENDING  Incomplete  Urine culture     Status: None   Collection Time: 10/31/15  2:02 AM  Result Value Ref Range Status   Specimen Description URINE, CATHETERIZED  Final   Special Requests NONE  Final   Culture >=100,000 COLONIES/mL YEAST  Final   Report Status  11/01/2015 FINAL  Final     Scheduled Meds: . calcium acetate  667 mg Oral TID WC  . vitamin B-12  500 mcg Oral Daily  . doxercalciferol      . doxercalciferol  1 mcg Intravenous Q M,W,F-HD  . feeding supplement (NEPRO CARB STEADY)  237 mL Oral BID BM  . fluconazole (DIFLUCAN) IV  200 mg Intravenous Q24H  . insulin aspart  0-9 Units Subcutaneous TID WC  . levothyroxine  175 mcg Oral QAC breakfast  . meropenem (MERREM) IV  500 mg Intravenous Q24H  . multivitamin  1 tablet Oral Daily  . rosuvastatin  20 mg Oral Daily  . sodium chloride  3 mL Intravenous Q12H  . vancomycin  750 mg Intravenous Q M,W,F-HD  . Warfarin - Pharmacist Dosing Inpatient   Does not apply q1800   Continuous Infusions:    Shamara Soza, DO  [facility] Pager [phone]  If 7PM-7AM, please contact night-coverage [email or web address] Password [email or web address] 11/03/2015, 3:45 PM   LOS: 3 days

## 2015-11-03 NOTE — Progress Notes (Signed)
CRITICAL VALUE ALERT  Critical value received:  INR 5.10  Date of notification:  11/03/2015  Time of notification:  0710  Critical value read back:Yes.    Nurse who received alert:  Rozann Lesches  MD notified (1st page):  Dr. Arbutus Leas  Time of first page:  [phone]

## 2015-11-03 NOTE — Care Management Important Message (Signed)
Important Message  Patient Details  Name: [name] [name] MRN: [id number] Date of Birth: Jul 21, 1936   Medicare Important Message Given:  Yes    Lexia Vandevender P Dodd Schmid 11/03/2015, 2:31 PM

## 2015-11-03 NOTE — Progress Notes (Signed)
This RN spoke with patient's son, [name], this morning regarding patient's dialysis treatment as requested.  Son and family requested that patient have only 2 hours of treatment today, instead of the normal 4 hours.  Made Angelito, HD RN, aware of family's wishes.  He stated he will notifiy MD.

## 2015-11-03 NOTE — Care Management Note (Signed)
Case Management Note  Patient Details  Name: MONNIE ANSPACH MRN: [id number] Date of Birth: October 13, 1936  Subjective/Objective:           CM following for progression and d/c planning.         Action/Plan: Noted pt active with [facility] for HHRN, HHPT and social work. Will follow for additional needs.   Expected Discharge Date:                  Expected Discharge Plan:  Home w Home Health Services  In-House Referral:  NA  Discharge planning Services  CM Consult  Post Acute Care Choice:  Home Health Choice offered to:  Patient  DME Arranged:    DME Agency:     HH Arranged:  RN, PT, Social Work [employer] Agency:  [facility]  Status of Service:  In process, will continue to follow  Medicare Important Message Given:  Yes Date Medicare IM Given:    Medicare IM give by:    Date Additional Medicare IM Given:    Additional Medicare Important Message give by:     If discussed at Long Length of Stay Meetings, dates discussed:    Additional Comments:  Alvia, Tory, RN 11/03/2015, 2:53 PM

## 2015-11-03 NOTE — Progress Notes (Signed)
ANTICOAGULATION CONSULT NOTE - Follow Up Consult  Pharmacy Consult for warfarin Indication: atrial fibrillation  Allergies  Allergen Reactions  . Tomato Other (See Comments)    Too much acid - cannot take due to kidney issues    Patient Measurements: Height:  (175.3 cm) Weight: 160 lb 15 oz (73 kg) IBW/kg (Calculated) : 70.7   Vital Signs: Temp: 100.5 F (38.1 C) (01/18 0739) Temp Source: Rectal (01/18 0739) BP: 110/62 mmHg (01/18 0414) Pulse Rate: 100 (01/18 0414)  Labs:  Recent Labs  10/31/15 2010  11/01/15 0212 11/01/15 1226 11/02/15 0644 11/03/15 0122 11/03/15 0524  HGB  --   < > 10.2*  --  10.6* 10.2*  --   HCT  --   --  31.9*  --  34.9* 33.6*  --   PLT  --   --  119*  --  122* 123*  --   LABPROT  --   --  43.6*  --  37.5*  --  45.6*  INR  --   --  4.80*  --  3.92*  --  5.10*  CREATININE  --   --  7.84*  --  4.72* 6.14*  --   TROPONINI 0.39*  --  0.36* 0.25*  --   --   --   < > = values in this interval not displayed.  Estimated Creatinine Clearance: 10.1 mL/min (by C-G formula based on Cr of 6.14).   Assessment: [name] recently admitted, back again with concern for sepsis. To continue warfarin for AFib. Home dose  daily except 7.5mg  on Mondays and Fridays. INR on admit therapeutic at 2.64- last dose was 1/14 PTA. He has received no warfarin sincee he was admitted. INR was decreasing, but made a drastic rise today to 5.1. Fluconazole was added yesterday which can potentiate INR.  hgb 10.2, plts 123- improved. No bleeding noted.  Goal of Therapy:  INR 2-3 Monitor platelets by anticoagulation protocol: Yes   Plan:  -hold warfarin -daily INR, CBC -follow s/s bleeding   Tiaja Hagan D. [place] Newmann, PharmD, BCPS Clinical Pharmacist Pager: [phone] 11/03/2015 8:25 AM

## 2015-11-04 ENCOUNTER — Inpatient Hospital Stay (HOSPITAL_COMMUNITY): Payer: Medicare Other

## 2015-11-04 ENCOUNTER — Encounter (HOSPITAL_COMMUNITY): Payer: Self-pay

## 2015-11-04 DIAGNOSIS — R7401 Elevation of levels of liver transaminase levels: Secondary | ICD-10-CM | POA: Insufficient documentation

## 2015-11-04 DIAGNOSIS — Z794 Long term (current) use of insulin: Secondary | ICD-10-CM

## 2015-11-04 DIAGNOSIS — R740 Nonspecific elevation of levels of transaminase and lactic acid dehydrogenase [LDH]: Secondary | ICD-10-CM

## 2015-11-04 LAB — COMPREHENSIVE METABOLIC PANEL
ALT: 66 U/L — AB (ref 17–63)
ANION GAP: 11 (ref 5–15)
AST: 484 U/L — ABNORMAL HIGH (ref 15–41)
Albumin: 1.9 g/dL — ABNORMAL LOW (ref 3.5–5.0)
Alkaline Phosphatase: 192 U/L — ABNORMAL HIGH (ref 38–126)
BUN: 33 mg/dL — ABNORMAL HIGH (ref 6–20)
CALCIUM: 8.9 mg/dL (ref 8.9–10.3)
CHLORIDE: 100 mmol/L — AB (ref 101–111)
CO2: 24 mmol/L (ref 22–32)
CREATININE: 5.32 mg/dL — AB (ref 0.61–1.24)
GFR, EST AFRICAN AMERICAN: 11 mL/min — AB (ref >60)
GFR, EST NON AFRICAN AMERICAN: 9 mL/min — AB (ref >60)
Glucose, Bld: 101 mg/dL — ABNORMAL HIGH (ref 65–99)
Potassium: 4.8 mmol/L (ref 3.5–5.1)
Sodium: 135 mmol/L (ref 135–145)
Total Bilirubin: 0.9 mg/dL (ref 0.3–1.2)
Total Protein: 7.1 g/dL (ref 6.5–8.1)

## 2015-11-04 LAB — CBC WITH DIFFERENTIAL/PLATELET
BASOS ABS: 0.1 10*3/uL (ref 0.0–0.1)
Basophils Relative: 2 %
EOS ABS: 0.3 10*3/uL (ref 0.0–0.7)
Eosinophils Relative: 4 %
HCT: 34.7 % — ABNORMAL LOW (ref 39.0–52.0)
HEMOGLOBIN: 10.9 g/dL — AB (ref 13.0–17.0)
LYMPHS ABS: 1.8 10*3/uL (ref 0.7–4.0)
LYMPHS PCT: 26 %
MCH: 29.6 pg (ref 26.0–34.0)
MCHC: 31.4 g/dL (ref 30.0–36.0)
MCV: 94.3 fL (ref 78.0–100.0)
Monocytes Absolute: 0.8 10*3/uL (ref 0.1–1.0)
Monocytes Relative: 12 %
NEUTROS ABS: 3.8 10*3/uL (ref 1.7–7.7)
Neutrophils Relative %: 56 %
PLATELETS: 131 10*3/uL — AB (ref 150–400)
RBC: 3.68 MIL/uL — AB (ref 4.22–5.81)
RDW: 17.9 % — AB (ref 11.5–15.5)
WBC: 6.8 10*3/uL (ref 4.0–10.5)

## 2015-11-04 LAB — GLUCOSE, CAPILLARY
GLUCOSE-CAPILLARY: 95 mg/dL (ref 65–99)
GLUCOSE-CAPILLARY: 137 mg/dL — AB (ref 65–99)
Glucose-Capillary: 155 mg/dL — ABNORMAL HIGH (ref 65–99)
Glucose-Capillary: 108 mg/dL — ABNORMAL HIGH (ref 65–99)

## 2015-11-04 LAB — AMMONIA: AMMONIA: 48 umol/L — AB (ref 9–35)

## 2015-11-04 LAB — PROTIME-INR
INR: 3.92 — AB (ref 0.00–1.49)
PROTHROMBIN TIME: 37.4 s — AB (ref 11.6–15.2)

## 2015-11-04 LAB — URIC ACID: URIC ACID, SERUM: 5.9 mg/dL (ref 4.4–7.6)

## 2015-11-04 MED ORDER — LACTULOSE 10 GM/15ML PO SOLN
20.0000 g | Freq: Two times a day (BID) | ORAL | Status: DC
  Administered 2015-11-04 – 2015-11-05 (×3): 20 g via ORAL
  Filled 2015-11-04 (×3): qty 30

## 2015-11-04 MED ORDER — CARVEDILOL 6.25 MG PO TABS
6.2500 mg | ORAL_TABLET | Freq: Two times a day (BID) | ORAL | Status: DC
  Administered 2015-11-04 – 2015-11-10 (×10): 6.25 mg via ORAL
  Filled 2015-11-04 (×13): qty 1

## 2015-11-04 MED ORDER — DARBEPOETIN ALFA 40 MCG/0.4ML IJ SOSY
40.0000 ug | PREFILLED_SYRINGE | INTRAMUSCULAR | Status: DC
  Administered 2015-11-05: 40 ug via INTRAVENOUS
  Filled 2015-11-04: qty 0.4

## 2015-11-04 MED ORDER — VITAMIN K1 10 MG/ML IJ SOLN
10.0000 mg | Freq: Once | INTRAMUSCULAR | Status: AC
  Administered 2015-11-04: 10 mg via INTRAVENOUS
  Filled 2015-11-04: qty 1

## 2015-11-04 MED ORDER — IOHEXOL 300 MG/ML  SOLN
100.0000 mL | Freq: Once | INTRAMUSCULAR | Status: AC | PRN
Start: 2015-11-04 — End: 2015-11-04
  Administered 2015-11-04: 100 mL via INTRAVENOUS

## 2015-11-04 NOTE — Progress Notes (Signed)
RN paged nephrologist concerning MRI. Waiting call back.  Veatrice Kells, RN

## 2015-11-04 NOTE — Progress Notes (Signed)
Pharmacy Antibiotic Follow-up Note  [name] [name] is a 80 y.o. year-old male admitted on 10/30/2015.  The patient is currently on day 5 of meropenem for sepsis of unknown origin along with day 16 total of vancomycin for PNA (last dose 1/13 at HD center), then sepsis of unknown origin (started here 1/15).  Assessment/Plan: After discussion with Dr. Arbutus Leas, vancomycin will be d/c -meropenem  IV q24h  Temp (24hrs), Avg:97.7 F (36.5 C), Min:97 F (36.1 C), Max:98.5 F (36.9 C)   Recent Labs Lab 10/31/15 0345 11/01/15 0212 11/02/15 0644 11/03/15 0122 11/04/15 0554  WBC 6.3 6.8 5.4 6.1 6.8     Recent Labs Lab 10/31/15 1108 11/01/15 0212 11/02/15 0644 11/03/15 0122 11/04/15 0554  CREATININE 6.78* 7.84* 4.72* 6.14* 5.32*   Estimated Creatinine Clearance: 11.6 mL/min (by C-G formula based on Cr of 5.32).    Allergies  Allergen Reactions  . Tomato Other (See Comments)    Too much acid - cannot take due to kidney issues    Antimicrobials this admission: Vanc 1/14>> Merrem 1/15>> Zosyn 1/14 x1 Fluc 1/17>>1/19  Levels/dose changes this admission: 1/16 pre-HD VR = 31- dose held after that HD session  Microbiology results: 1/15 BCx2: ngtd 1/15 UCx: >100K yeast 1/7 MRSA PCR: neg  Patient has been having knee pain, and MRI revealed complex joint effusion. To have radiology perform arthrocentesis when INR <1.5  Thank you for allowing pharmacy to be a part of this patient's care.  Isaac Bliss, PharmD, BCPS, [facility] Clinical Pharmacist Pager [phone] 11/04/2015 1:17 PM

## 2015-11-04 NOTE — Progress Notes (Signed)
Patient sent down for MRI. RN received call concerning how long the patient has been on dialysis. RN informed radiologist team that patient recently had AV fistula placement on 09/10/2015. Radiologist expressed concern about giving patient contrast if patient has not been on dialysis for more than 6 months. Patient to return back to the unit. RN will notify MD.   Veatrice Kells, RN

## 2015-11-04 NOTE — Progress Notes (Signed)
Pharmacy Antibiotic Follow-up Note  [name] [name] is a 80 y.o. year-old male admitted on 10/30/2015.  The patient is currently on day 5 of meropenem for sepsis of unknown origin along with day 16 total of vancomycin for PNA (last dose 1/13 at HD center), then sepsis of unknown origin (started here 1/15).  Assessment/Plan: This patient's current antibiotics will be continued without adjustments.  -vancomycin  IV qHD- MWF -meropenem  IV q24h  Temp (24hrs), Avg:97.9 F (36.6 C), Min:97 F (36.1 C), Max:99.7 F (37.6 C)   Recent Labs Lab 10/31/15 0345 11/01/15 0212 11/02/15 0644 11/03/15 0122 11/04/15 0554  WBC 6.3 6.8 5.4 6.1 6.8    Recent Labs Lab 10/31/15 1108 11/01/15 0212 11/02/15 0644 11/03/15 0122 11/04/15 0554  CREATININE 6.78* 7.84* 4.72* 6.14* 5.32*   Estimated Creatinine Clearance: 11.6 mL/min (by C-G formula based on Cr of 5.32).    Allergies  Allergen Reactions  . Tomato Other (See Comments)    Too much acid - cannot take due to kidney issues    Antimicrobials this admission: Vanc 1/14>> Merrem 1/15>> Zosyn 1/14 x1 Fluc 1/17>>1/19  Levels/dose changes this admission: 1/16 pre-HD VR = 31- dose held after that HD session  Microbiology results: 1/15 BCx2: ngtd 1/15 UCx: >100K yeast 1/7 MRSA PCR: neg  Patient has been having knee pain, and MRI revealed complex joint effusion. To have radiology perform arthrocentesis when INR <1.5  Thank you for allowing pharmacy to be a part of this patient's care.  Brigid Re PharmD 11/04/2015 8:42 AM

## 2015-11-04 NOTE — Progress Notes (Signed)
Received call from nephrologist. Nephrologist advised RN to inform oncoming RN.   Veatrice Kells, RN

## 2015-11-04 NOTE — Progress Notes (Signed)
[facility] Progress Note  Assessment/Plan: 1. FUO- Sepsis treated Yeast UTI  hx HCAP d/c 1/9 - still febrile 100.5; has right IJ TDC; no growth on [facility] 1/15 so far, on Vanc and  Meropenem; Diflucan stopped 1/19 , LFTs increasing- abdominal CT pending; for arthrocentesis but INR ^ of knee due to MRI findings 2. ESRD - MWF - next HD Friday K 4.8 3. Anemia - Hgb variable - 12>8.5> 10.2 up to 10.9 on non HD day- resume low dose Aranesp Friday , weekly low dose Fe,  4. Secondary hyperparathyroidism - P 4 - on one phoslo ac; resumed Hectorol 1 ac 5. HTN/volume - BP meds on hold; BP low 100s net UF 2 L Monday with post wt of 70.2, kept even on Wed with post weight 71.3 6. Nutrition - renal diet/vitamin/nepro alb 1.9 7. ^ LFTS - trending up - abdominal CT pending, statin held though not likely source of ^ 8. DM - off meds - per primary 9. Dementia - worse with infection; actually clearer today;  10. Chronic afib -  On coumadin  As outpt;  ^ upon admission - still high at 3.92 off warfarin 11. Hypothyroidism -TSH 3.1 1/6 12. Thrombocytopenia- follow - seems to be trending up  Sheffield Slider, PA-C [facility] Beeper [phone] 11/04/2015,8:10 AM  LOS: 4 days  Renal Attending: He did feel better post dialysis with no fluid removal.  Will do similar Friday. Kattia Selley C    Subjective:   No c/o eating breakfast. At the hospital on [address]. Trump is the president.  Objective Filed Vitals:   11/03/15 1229 11/03/15 1738 11/03/15 2051 11/04/15 0500  BP:  122/80 116/75 105/60  Pulse:  106 111 86  Temp: 99.7 F (37.6 C) 97.8 F (36.6 C) 97.5 F (36.4 C) 97 F (36.1 C)  TempSrc: Axillary Oral Axillary Oral  Resp:  Height:      Weight:   71 kg (156 lb 8.4 oz)   SpO2:  100% 99% 99%   Physical Exam General: NAD eating breakfast - small amounts Heart: irreg irreg ~100 - 120 Lungs:  Dim BS grossly clear Abdomen: soft NT, somewhat full Extremities:  no sig edema Dialysis Access: right IJ left upper AVF  Dialysis Orders: [facility] on MWF . EDW 72.5 HD Bath 2k,2.25ca Time 4hr Heparin 1500. Access RIJ perm cath / Lua avf ( insert 09/10/15)  Hect. 1.0 mcg IV/HD Units IV/HD Venofer  bolus Last dose 11/01/15 needs  q wkk  Other op lab hgb 11.2 / ca 9.9 phos 3.6 TFS 13% PTH 297  Additional Objective Labs: Lab Results  Component Value Date   INR 3.92* 11/04/2015   INR 5.10* 11/03/2015   INR 3.92* 11/02/2015   \ Basic Metabolic Panel:  Recent Labs Lab 10/31/15 1108  11/02/15 0644 11/03/15 0122 11/03/15 0900 11/04/15 0554  NA 137  < > 137 136  --  135  K 4.6  < > 3.9 4.2  --  4.8  CL 99*  < > 98* 97*  --  100*  CO2 22  < > 26 25  --  24  GLUCOSE 73  < > 82 229*  --  101*  BUN 48*  < > 21* 38*  --  33*  CREATININE 6.78*  < > 4.72* 6.14*  --  5.32*  CALCIUM 8.8*  < > 8.1* 8.5*  --  8.9  PHOS 5.8*  --   --   --  4.0  --   < > = values in this interval not displayed. Liver Function Tests:  Recent Labs Lab 11/02/15 0644 11/03/15 0122 11/04/15 0554  AST 133* 171* 484*  ALT 25 33 66*  ALKPHOS 132* 164* 192*  BILITOT 0.8 0.6 0.9  PROT 6.6 6.4* 7.1  ALBUMIN 1.8* 1.7* 1.9*   CBC:  Recent Labs Lab 10/31/15 0345 11/01/15 0212 11/02/15 0644 11/03/15 0122 11/04/15 0554  WBC 6.3 6.8 5.4 6.1 6.8  NEUTROABS  --  4.5 3.0  --  PENDING  HGB 8.5* 10.2* 10.6* 10.2* 10.9*  HCT 28.5* 31.9* 34.9* 33.6* 34.7*  MCV 95.3 93.3 95.4 94.1 94.3  PLT 97* 119* 122* 123* PENDING   Blood Culture    Component Value Date/Time   SDES URINE, CATHETERIZED 10/31/2015 0202   SPECREQUEST NONE 10/31/2015 0202   CULT >=100,000 COLONIES/mL YEAST 10/31/2015 0202   REPTSTATUS 11/01/2015 FINAL 10/31/2015 0202    Cardiac Enzymes:  Recent Labs Lab 10/31/15 0834 10/31/15 1410 10/31/15 2010 11/01/15 0212 11/01/15 1226  TROPONINI 0.58* 0.56* 0.39* 0.36* 0.25*   CBG:  Recent Labs Lab 11/03/15 0736 11/03/15 1227  11/03/15 1637 11/03/15 2049 11/04/15 0737  GLUCAP 174* 111* 129* 105* 95    Studies/Results: Mr Knee Left  Wo Contrast  11/03/2015  CLINICAL DATA:  80 year old in patient on dialysis with left-greater-than-right knee pain. Fever of unknown origin. EXAM: MRI OF THE LEFT KNEE WITHOUT CONTRAST TECHNIQUE: Multiplanar, multisequence MR imaging of the knee was performed. No intravenous contrast was administered. COMPARISON:  None. FINDINGS: Study is mildly motion degraded. MENISCI Medial meniscus: Meniscal degeneration with irregular radial tear in the posterior horn. The meniscal root is intact. No centrally displaced meniscal fragment. Lateral meniscus:  Meniscal degeneration without discrete tear. LIGAMENTS Cruciates:  Intact with mild mucoid degeneration. Collaterals: Intact. There is a small amount of medial bursal fluid. The popliteus tendon is thickened near its femoral insertion. CARTILAGE Patellofemoral:  Adequately preserved. Medial: Mild chondral thinning and surface irregularity. There is mild subchondral edema and intraosseous ganglion formation throughout the medial tibial plateau. Lateral:  Adequately preserved. OTHER Joint: Moderate size complex joint effusion consistent with synovitis. No discrete intra-articular loose bodies observed. Popliteal Fossa:  Small mildly complex Baker's cyst. Extensor Mechanism: The distal quadriceps tendon appears normal. There is patellar tendinosis with a long segment of tubular T2 hyperintensity in the medial aspect of the tendon Bones: There are several intraosseous ganglia in the proximal tibia, largest posteriorly in the medial tibial plateau, measuring up to 1.6 cm. There is no evidence of periarticular bone marrow edema or osteomyelitis. IMPRESSION: 1. Degenerative radial tear of the posterior horn of the medial meniscus. 2. Lateral meniscal degeneration without evidence of tear. 3. Complex joint effusion consistent with synovitis. No specific evidence of  infection or osteomyelitis. Correlate clinically. 4. Intra tendinous cyst formation medially in the patellar tendon, likely a ganglion cyst. This could indicate underlying interstitial tendon tearing. Electronically Signed   By: Carey Bullocks M.D.   On: 11/03/2015 19:59   Medications:   . calcium acetate  667 mg Oral TID WC  . carvedilol  3.125 mg Oral BID WC  . vitamin B-12  500 mcg Oral Daily  . doxercalciferol  1 mcg Intravenous Q M,W,F-HD  . feeding supplement (NEPRO CARB STEADY)  237 mL Oral BID BM  . fluconazole (DIFLUCAN) IV  200 mg Intravenous Q24H  . insulin aspart  0-9 Units Subcutaneous TID WC  . levothyroxine  175 mcg Oral QAC breakfast  .  meropenem (MERREM) IV  500 mg Intravenous Q24H  . multivitamin  1 tablet Oral Daily  . rosuvastatin  20 mg Oral Daily  . sodium chloride  3 mL Intravenous Q12H  . vancomycin  750 mg Intravenous Q M,W,F-HD  . Warfarin - Pharmacist Dosing Inpatient   Does not apply [phone]

## 2015-11-04 NOTE — Clinical Social Work Note (Signed)
Clinical Social Work Assessment  Patient Details  Name: [name] [name] MRN: [id number] Date of Birth: 1936/05/08  Date of referral:  11/04/15               Reason for consult:  Facility Placement, Discharge Planning                Permission sought to share information with:  Facility Medical sales representative Permission granted to share information::  Yes, Verbal Permission Granted  Name::     Gibril Mastro, Markes Shatswell, April Clinton Sawyer  Agency::     Relationship::  Spouse Jasmine December) , Son [name]), Daughter (April)   Contact Information:  [phone] Jasmine December),  [phone] [name]),  [phone] (April)  Housing/Transportation Living arrangements for the past 2 months:  Single Family Home Source of Information:  Adult Children (Son, Lytle Malburg) Patient Interpreter Needed:  None Criminal Activity/Legal Involvement Pertinent to Current Situation/Hospitalization:  No - Comment as needed Significant Relationships:  Adult Children, Spouse Lives with:  Spouse Do you feel safe going back to the place where you live?  Yes Need for family participation in patient care:  Yes (Comment)  Care giving concerns:  Not discussed.    Social Worker assessment / plan:  Mr. Aderman is only oriented to person and place.  CSW intern called and spoke with patient's wife, Jasmine December regarding her husband's discharge planning. She directed CSW Intern to patient's son [name]).  CSW  informed patient's son that PT were recommending SNF after discharge. Patient's son was in agreement with Mr. Berenson being discharged to a SNF to receive short-term rehab. [name] stated that [facility] and [facility] were the family's two preferences. A list of facilities in the area were provided to the son via email ([email or web address]). [name] had no further questions or concerns at this time.   Employment status:  Retired Health and safety inspector:  Medicare, Other (Comment Required), Medicaid In [employer] PT  Recommendations:  Skilled Nursing Facility Information / Referral to community resources:  Skilled Nursing Facility  Patient/Family's Response to care:  Not discussed.  Patient/Family's Understanding of and Emotional Response to Diagnosis, Current Treatment, and Prognosis:  Not discussed.   Emotional Assessment Appearance:  Appears stated age Attitude/Demeanor/Rapport:  Unable to Assess Affect (typically observed):  Unable to Assess Orientation:  Oriented to Self, Oriented to Place Alcohol / Substance use:  Tobacco Use (Patient reports that he quit smoking about 62 years ago. His smoking use included cigarettes. He smoked 0.50 packs per day. He does not have any smokeless tobacco history on file. He reports that he does not drink alcohol or use illicit drugs.) Psych involvement (Current and /or in the community):  No (Comment)  Discharge Needs  Concerns to be addressed:  Discharge Planning Concerns Readmission within the last 30 days:  Yes Current discharge risk:  Other (Deconditioned ) Barriers to Discharge:  No Barriers Identified   Baldo Daub, Student-SW 11/04/2015, 11:51 AM

## 2015-11-04 NOTE — Progress Notes (Signed)
PROGRESS NOTE  [name] [name] ZOX:[id number] DOB: [birth year] DOA: 10/30/2015 PCP: [name] Conch, MD Admit HPI / Brief Narrative: 80 y.o. M Hx CAD, HTN, HLD, CHF, ESRD dialized M/W/F, hypothyroidism, DM2, and a HCAP admission w/ discharge 1/9 on fortaz and vanc with dialysis (last dose 1/13) who was brought in by family with confusion, and decreased appetitie for a couple of days. In the ED he was febrile to 102.6. He was given zosyn, vanc, and IVF, and his mental status improved.    Assessment/Plan:  Sepsis of unclear etiology -Has a hemodialysis catheter which could potentially be the source, continue IV vancomycin, meropenem, fluconazole  urine culture with greater than 100,000 colonies of yeast-->unclear clinical significance -Follow-up blood cultures--neg to date -CT abd/pelvis--right splenic cyst, no abscess, no colonic inflammatory changes, cholelithiasis without cholecystitis -CT chest--right hilar and right paratracheal lymph node; loculated pleural effusion right oblique fissure without consolidation -MRI left knee irregular meniscal tear of the posterior horn with moderately complex joint effusion -RUQ ultrasound  metabolic encephalopathy in the setting of above/?underlying cognitive impairment -due to fever ?infectious process -ammonia  Left Knee pain -check uric acid -MR left knee--?significant effusion to tap--hesitate to tap blindly in pt on warfarin -discussed with IR, Dr. Konrad [name] to attempt arthrocentesis if warfarin is reversed -Vitamin K 10 mg IV 1--> recheck INR 11/05/2015  ESRD on HD M/W/F HD fistula placed 09/03/15 in L UE w/ good thrill to palpation -HD catheter and right chest, follow-up blood cultures, HD per renal -appreciate nephrology Warfarin induced coagulopathy Dosing per Rx - follow  -Reverse warfarin as discussed above  AST elevation  -Likely due to sepsis, stable, monitor  -Discontinue fluconazole and  Crestor  Chronic Afib w/ acute RVR  CHADS-VASc 8 - on chronic warfarin - rate controlled  -10/24/15--Echo EF 40-45%, severe TR -Restart carvedilol-->increase dose to 6.25 mg twice a day  Chronic systolic CHF  Volume management per HD  -10/24/15--Echo EF 40-45%, severe TR -Restart carvedilol lower dose  Elevated troponin - CAD s/p PCI RCA  Trop peaked at 0.56 - most c/w demand ischemia/rate related - no chest pain   DM2 -Hemoglobin A1c--5.9 -Patient normally takes glipizide at home -[employer] sliding scale  HTN BP currently well controlled    Hypothyroidism s/p thyroidectomy  Cont home synthroid dose   Anemia of CKD -B12 marginally low -supplement  Stage 1 sacral wound -not infected on exam -consult wound care -present at time of admission  Code Status: FULL Family Communication: updated son [name] [name] on phone 11/04/15 Disposition Plan: Home versus SNF, pending workup  Consultants: Nephrology   Procedures: 1/8 TTE - EF 40-45% - hypokinesis anteroseptal myocardium - mild mitral valve regurg - LA mildly dilated - RA mod dilated - severe TR - PA pressure 39mm Hg  Antibiotics: Zosyn (prior to admit)  Vanc (prior to admit) > Meropenem 1/15 >  DVT prophylaxis: warfarin   Procedures/Studies: Dg Chest 2 View  10/24/2015  CLINICAL DATA:  Nonproductive cough, hypoxemia. EXAM: CHEST  2 VIEW COMPARISON:  Chest x-rays dated 10/20/2015 and 09/04/2015. FINDINGS: Fairly severe cardiomegaly is unchanged. Right chest wall dual lumen catheter is stable in position with tip projected over the right atrium. Central pulmonary vascular congestion and mild bilateral interstitial edema is unchanged, suspected to be chronic. New confluent airspace opacity is seen at the right lung base. IMPRESSION: 1. New confluent airspace opacity at the right lung base, pneumonia versus aspiration. 2. Stable cardiomegaly. The mild central  pulmonary vascular congestion and bilateral interstitial edema is  not significantly changed, most likely related to a chronic mild CHF. Electronically Signed   By: Bary Richard M.D.   On: 10/24/2015 11:10   Dg Chest 2 View  10/20/2015  CLINICAL DATA:  Acute onset of altered mental status. Tachycardia. Initial encounter. EXAM: CHEST  2 VIEW COMPARISON:  Chest radiograph performed 09/04/2015 FINDINGS: The lungs are well-aerated. Vascular congestion is noted. A small left pleural effusion is suspected. Increased interstitial markings likely reflect pulmonary edema. There is no evidence of pneumothorax. The heart is enlarged. No acute osseous abnormalities are seen. A right-sided dual-lumen catheter is noted ending about the cavoatrial junction. IMPRESSION: Vascular congestion and cardiomegaly. Small left pleural effusion suspected. Increased interstitial markings likely reflect pulmonary edema. Electronically Signed   By: [name] M.D.   On: 10/20/2015 22:00   Ct Head Wo Contrast  10/20/2015  CLINICAL DATA:  Altered mental status EXAM: CT HEAD WITHOUT CONTRAST TECHNIQUE: Contiguous axial images were obtained from the base of the skull through the vertex without intravenous contrast. COMPARISON:  Head CT July 19, 2015; brain MRI July 20, 2015 FINDINGS: There is stable age related volume loss. There is no intracranial mass, hemorrhage, extra-axial fluid collection, or midline shift. There is stable slight small vessel disease in the centra semiovale bilaterally. Elsewhere gray-white compartments are normal. No acute infarct is evident. Calcification along the falx is stable. Bony calvarium appears intact. The mastoid air cells are clear. No intraorbital lesions are identified. IMPRESSION: Slight periventricular small vessel disease, stable. No intracranial mass, hemorrhage, or new gray - white compartment lesions/acute appearing infarct. Stable appearance compared to prior studies. Electronically Signed   By: Bretta Bang III M.D.   On: 10/20/2015 21:26   Ct Chest  W Contrast  11/04/2015  CLINICAL DATA:  Fever of unknown origin. History of coronary disease hypertension, congestive heart failure chronic renal disease. EXAM: CT CHEST, ABDOMEN, AND PELVIS WITH CONTRAST TECHNIQUE: Multidetector CT imaging of the chest, abdomen and pelvis was performed following the standard protocol during bolus administration of intravenous contrast. CONTRAST:  OMNIPAQUE IOHEXOL 300 MG/ML  SOLN COMPARISON:  Chest radiograph 10/24/2015, 10/20/2015 on CT thorax 03/12/2004 FINDINGS: CT CHEST FINDINGS Mediastinum/Nodes: No axillary or supraclavicular adenopathy. The paratracheal adenopathy is present. Example 16 mm short axis RIGHT lower paratracheal lymph node which is increased from 12 mm on prior. RIGHT hilar lymph node measures 20 mm on image 25 series 201 increased from 18 mm. AP window node measures 9 mm compared to 11 mm. No central pulmonary embolism. Borderline enlarged LEFT supraclavicular lymph node measures 11 mm (image 5, series 201) Cardiomegaly noted. Lungs/Pleura: There is interlobular septal thickening in the lungs. Extensive centrilobular emphysema. There is loculated fluid collections along the RIGHT oblique fissure. No consolidation to suggest active pulmonary infection. Musculoskeletal: No aggressive osseous lesion. CT ABDOMEN AND PELVIS FINDINGS Hepatobiliary: There is reflux from the injection bolus into the hepatic veins consistent with RIGHT heart insufficiency. No focal hepatic lesion. Several gallstones collect within the decompressed gallbladder. No biliary duct dilatation. Pancreas: Pancreas is normal. No ductal dilatation. No pancreatic inflammation. Spleen: Low-attenuation lesion within the spleen is rounded measuring 3.8 cm. This is new from 2005. Adrenals/urinary tract: Adrenal glands are normal. Poor renal cortical enhancement. High-density lesion measuring 8 mm in the RIGHT renal cortex likely represents hemorrhagic cyst. Ureters and bladder normal. The  bladder is collapsed Stomach/Bowel: Stomach, small bowel and cecum are normal. The appendix is not identified. Small  amount fluid inferior to the cecal tip (image 99 of the axial series). Colon rectosigmoid colon are normal. No evidence of colonic inflammation or infection Vascular/Lymphatic: Abdominal aorta is normal caliber with atherosclerotic calcification. There is no retroperitoneal or periportal lymphadenopathy. No pelvic lymphadenopathy. Reproductive: Prostate normal Other: No abscess in the abdomen pelvis. Musculoskeletal: No aggressive osseous lesion. Gas in the L4-L5 disc space likely relates to degenerative change. There is significant arthropathy at the facet joints at this level. IMPRESSION: Chest Impression: 1. Progressive mediastinal and hilar lymphadenopathy from CT 2005. This may relate to congestive heart failure. Cannot exclude lymphoproliferative process such as CLL. No significant adenopathy in the abdomen or axilla would favor against lymphoma. 2. Cardiomegaly and loculated pleural effusions with interlobular septal thickening likely relates to congestive heart failure. RIGHT heart insufficiency noted with contrast reflux into hepatic veins. 3. Centrilobular emphysema. 4. No evidence of acute pulmonary infection. Abdomen / Pelvis Impression: 1. Large round lesion within the spleen is new from 2005. In patient with fever of unknown origin cannot exclude a splenic abscess. No evidence of enhancement with favor benign cyst. Consider MRI of the abdomen with contrast. Of note, if patient cannot hold breath then MRI may not be a suitable imaging modality. In that case, consider FDG PET scan for evaluation if clinical suspicion warrants. 2. No evidence of abscess or inflammation the peritoneal space. 3. Small amount of free fluid along the RIGHT pericolic gutter likely relates to heart failure described above. 4. Cholelithiasis without evidence acute cholecystitis. Electronically Signed   By: Genevive Bi M.D.   On: 11/04/2015 08:29   Ct Abdomen Pelvis W Contrast  11/04/2015  CLINICAL DATA:  Fever of unknown origin. History of coronary disease hypertension, congestive heart failure chronic renal disease. EXAM: CT CHEST, ABDOMEN, AND PELVIS WITH CONTRAST TECHNIQUE: Multidetector CT imaging of the chest, abdomen and pelvis was performed following the standard protocol during bolus administration of intravenous contrast. CONTRAST:  OMNIPAQUE IOHEXOL 300 MG/ML  SOLN COMPARISON:  Chest radiograph 10/24/2015, 10/20/2015 on CT thorax 03/12/2004 FINDINGS: CT CHEST FINDINGS Mediastinum/Nodes: No axillary or supraclavicular adenopathy. The paratracheal adenopathy is present. Example 16 mm short axis RIGHT lower paratracheal lymph node which is increased from 12 mm on prior. RIGHT hilar lymph node measures 20 mm on image 25 series 201 increased from 18 mm. AP window node measures 9 mm compared to 11 mm. No central pulmonary embolism. Borderline enlarged LEFT supraclavicular lymph node measures 11 mm (image 5, series 201) Cardiomegaly noted. Lungs/Pleura: There is interlobular septal thickening in the lungs. Extensive centrilobular emphysema. There is loculated fluid collections along the RIGHT oblique fissure. No consolidation to suggest active pulmonary infection. Musculoskeletal: No aggressive osseous lesion. CT ABDOMEN AND PELVIS FINDINGS Hepatobiliary: There is reflux from the injection bolus into the hepatic veins consistent with RIGHT heart insufficiency. No focal hepatic lesion. Several gallstones collect within the decompressed gallbladder. No biliary duct dilatation. Pancreas: Pancreas is normal. No ductal dilatation. No pancreatic inflammation. Spleen: Low-attenuation lesion within the spleen is rounded measuring 3.8 cm. This is new from 2005. Adrenals/urinary tract: Adrenal glands are normal. Poor renal cortical enhancement. High-density lesion measuring 8 mm in the RIGHT renal cortex likely  represents hemorrhagic cyst. Ureters and bladder normal. The bladder is collapsed Stomach/Bowel: Stomach, small bowel and cecum are normal. The appendix is not identified. Small amount fluid inferior to the cecal tip (image 99 of the axial series). Colon rectosigmoid colon are normal. No evidence of colonic inflammation or infection Vascular/Lymphatic:  Abdominal aorta is normal caliber with atherosclerotic calcification. There is no retroperitoneal or periportal lymphadenopathy. No pelvic lymphadenopathy. Reproductive: Prostate normal Other: No abscess in the abdomen pelvis. Musculoskeletal: No aggressive osseous lesion. Gas in the L4-L5 disc space likely relates to degenerative change. There is significant arthropathy at the facet joints at this level. IMPRESSION: Chest Impression: 1. Progressive mediastinal and hilar lymphadenopathy from CT 2005. This may relate to congestive heart failure. Cannot exclude lymphoproliferative process such as CLL. No significant adenopathy in the abdomen or axilla would favor against lymphoma. 2. Cardiomegaly and loculated pleural effusions with interlobular septal thickening likely relates to congestive heart failure. RIGHT heart insufficiency noted with contrast reflux into hepatic veins. 3. Centrilobular emphysema. 4. No evidence of acute pulmonary infection. Abdomen / Pelvis Impression: 1. Large round lesion within the spleen is new from 2005. In patient with fever of unknown origin cannot exclude a splenic abscess. No evidence of enhancement with favor benign cyst. Consider MRI of the abdomen with contrast. Of note, if patient cannot hold breath then MRI may not be a suitable imaging modality. In that case, consider FDG PET scan for evaluation if clinical suspicion warrants. 2. No evidence of abscess or inflammation the peritoneal space. 3. Small amount of free fluid along the RIGHT pericolic gutter likely relates to heart failure described above. 4. Cholelithiasis without  evidence acute cholecystitis. Electronically Signed   By: Genevive Bi M.D.   On: 11/04/2015 08:29   Mr Knee Left  Wo Contrast  11/03/2015  CLINICAL DATA:  80 year old in patient on dialysis with left-greater-than-right knee pain. Fever of unknown origin. EXAM: MRI OF THE LEFT KNEE WITHOUT CONTRAST TECHNIQUE: Multiplanar, multisequence MR imaging of the knee was performed. No intravenous contrast was administered. COMPARISON:  None. FINDINGS: Study is mildly motion degraded. MENISCI Medial meniscus: Meniscal degeneration with irregular radial tear in the posterior horn. The meniscal root is intact. No centrally displaced meniscal fragment. Lateral meniscus:  Meniscal degeneration without discrete tear. LIGAMENTS Cruciates:  Intact with mild mucoid degeneration. Collaterals: Intact. There is a small amount of medial bursal fluid. The popliteus tendon is thickened near its femoral insertion. CARTILAGE Patellofemoral:  Adequately preserved. Medial: Mild chondral thinning and surface irregularity. There is mild subchondral edema and intraosseous ganglion formation throughout the medial tibial plateau. Lateral:  Adequately preserved. OTHER Joint: Moderate size complex joint effusion consistent with synovitis. No discrete intra-articular loose bodies observed. Popliteal Fossa:  Small mildly complex Baker's cyst. Extensor Mechanism: The distal quadriceps tendon appears normal. There is patellar tendinosis with a long segment of tubular T2 hyperintensity in the medial aspect of the tendon Bones: There are several intraosseous ganglia in the proximal tibia, largest posteriorly in the medial tibial plateau, measuring up to 1.6 cm. There is no evidence of periarticular bone marrow edema or osteomyelitis. IMPRESSION: 1. Degenerative radial tear of the posterior horn of the medial meniscus. 2. Lateral meniscal degeneration without evidence of tear. 3. Complex joint effusion consistent with synovitis. No specific evidence  of infection or osteomyelitis. Correlate clinically. 4. Intra tendinous cyst formation medially in the patellar tendon, likely a ganglion cyst. This could indicate underlying interstitial tendon tearing. Electronically Signed   By: Carey Bullocks M.D.   On: 11/03/2015 19:59   Dg Chest Port 1 View  10/30/2015  CLINICAL DATA:  Acute onset of shortness of breath. Initial encounter. EXAM: PORTABLE CHEST 1 VIEW COMPARISON:  Chest radiograph performed 10/24/2015 FINDINGS: The patient's right-sided dual-lumen catheter is noted ending overlying the right atrium. The  lungs are well-aerated. Vascular congestion is noted. Increased interstitial markings raise concern for mild interstitial edema. A small left pleural effusion may be present. No pneumothorax is seen. The cardiomediastinal silhouette is mildly enlarged. No acute osseous abnormalities are seen. IMPRESSION: Vascular congestion and mild cardiomegaly. Increased interstitial markings raise concern for mild interstitial edema. Small left pleural effusion may be present. Electronically Signed   By: [name] M.D.   On: 10/30/2015 23:54         Subjective: Patient is pleasant but confused but able to medicate properly. Complains of knee pain. Denies any headache, chest pain, shortness breath, vomiting, diarrhea, abdominal pain.   Objective: Filed Vitals:   11/03/15 1738 11/03/15 2051 11/04/15 0500 11/04/15 0914  BP: 122/80 116/75 105/60 110/58  Pulse: 106 111 86 111  Temp: 97.8 F (36.6 C) 97.5 F (36.4 C) 97 F (36.1 C) 98.5 F (36.9 C)  TempSrc: Oral Axillary Oral Axillary  Resp: 18 18 18 16   Height:      Weight:  71 kg (156 lb 8.4 oz)    SpO2: 100% 99% 99% 100%    Intake/Output Summary (Last 24 hours) at 11/04/15 1222 Last data filed at 11/04/15 1030  Gross per 24 hour  Intake   1260 ml  Output      0 ml  Net   1260 ml   Weight change: -1.7 kg (-3 lb 12 oz) Exam:   General:  Pt is alert, follows commands appropriately,  not in acute distress  HEENT: No icterus, No thrush, No neck mass, [place]/AT  Cardiovascular: RRR, S1/S2, no rubs, no gallops  Respiratory: Bibasilar crackles, right greater than left. No wheeze   Abdomen: Soft/+BS, non tender, non distended, no guarding  Extremities: No edema, No lymphangitis, No petechiae, No rashes, synovitis left knee without any crepitance  Data Reviewed: Basic Metabolic Panel:  Recent Labs Lab 10/31/15 1108 11/01/15 0212 11/02/15 0644 11/03/15 0122 11/03/15 0900 11/04/15 0554  NA 137 135 137 136  --  135  K 4.6 3.8 3.9 4.2  --  4.8  CL 99* 100* 98* 97*  --  100*  CO2 22 23 26 25   --  24  GLUCOSE 73 271* 82 229*  --  101*  BUN 48* 56* 21* 38*  --  33*  CREATININE 6.78* 7.84* 4.72* 6.14*  --  5.32*  CALCIUM 8.8* 8.2* 8.1* 8.5*  --  8.9  MG  --  2.1  --   --   --   --   PHOS 5.8*  --   --   --  4.0  --    Liver Function Tests:  Recent Labs Lab 10/30/15 2357 10/31/15 1108 11/01/15 0212 11/02/15 0644 11/03/15 0122 11/04/15 0554  AST 55*  --  105* 133* 171* 484*  ALT 8*  --  16* 25 33 66*  ALKPHOS 99  --  115 132* 164* 192*  BILITOT 0.6  --  0.4 0.8 0.6 0.9  PROT 7.3  --  5.9* 6.6 6.4* 7.1  ALBUMIN 2.3* 1.9* 1.7* 1.8* 1.7* 1.9*   No results for input(s): LIPASE, AMYLASE in the last 168 hours. No results for input(s): AMMONIA in the last 168 hours. CBC:  Recent Labs Lab 10/30/15 2357 10/31/15 0345 11/01/15 0212 11/02/15 0644 11/03/15 0122 11/04/15 0554  WBC 6.8 6.3 6.8 5.4 6.1 6.8  NEUTROABS 4.4  --  4.5 3.0  --  3.8  HGB 12.0* 8.5* 10.2* 10.6* 10.2* 10.9*  HCT 38.6* 28.5*  31.9* 34.9* 33.6* 34.7*  MCV 95.3 95.3 93.3 95.4 94.1 94.3  PLT 165 97* 119* 122* 123* 131*   Cardiac Enzymes:  Recent Labs Lab 10/31/15 0834 10/31/15 1410 10/31/15 2010 11/01/15 0212 11/01/15 1226  TROPONINI 0.58* 0.56* 0.39* 0.36* 0.25*   BNP: Invalid input(s): POCBNP CBG:  Recent Labs Lab 11/03/15 1227 11/03/15 1637 11/03/15 2049 11/04/15 0737  11/04/15 1153  GLUCAP 111* 129* 105* 95 155*    Recent Results (from the past 240 hour(s))  Blood Culture (routine x 2)     Status: None (Preliminary result)   Collection Time: 10/31/15 12:45 AM  Result Value Ref Range Status   Specimen Description BLOOD RIGHT HAND  Final   Special Requests IN PEDIATRIC BOTTLE 3CC  Final   Culture NO GROWTH 3 DAYS  Final   Report Status PENDING  Incomplete  Blood Culture (routine x 2)     Status: None (Preliminary result)   Collection Time: 10/31/15 12:49 AM  Result Value Ref Range Status   Specimen Description BLOOD LEFT HAND  Final   Special Requests IN PEDIATRIC BOTTLE 4CC  Final   Culture NO GROWTH 3 DAYS  Final   Report Status PENDING  Incomplete  Urine culture     Status: None   Collection Time: 10/31/15  2:02 AM  Result Value Ref Range Status   Specimen Description URINE, CATHETERIZED  Final   Special Requests NONE  Final   Culture >=100,000 COLONIES/mL YEAST  Final   Report Status 11/01/2015 FINAL  Final     Scheduled Meds: . calcium acetate  667 mg Oral TID WC  . carvedilol  3.125 mg Oral BID WC  . vitamin B-12  500 mcg Oral Daily  . [START ON 11/05/2015] darbepoetin (ARANESP) injection - DIALYSIS  40 mcg Intravenous Q Fri-HD  . doxercalciferol  1 mcg Intravenous Q M,W,F-HD  . feeding supplement (NEPRO CARB STEADY)  237 mL Oral BID BM  . insulin aspart  0-9 Units Subcutaneous TID WC  . levothyroxine  175 mcg Oral QAC breakfast  . meropenem (MERREM) IV  500 mg Intravenous Q24H  . multivitamin  1 tablet Oral Daily  . sodium chloride  3 mL Intravenous Q12H  . vancomycin  750 mg Intravenous Q M,W,F-HD   Continuous Infusions:    Tekeya Geffert, DO  [facility] Pager [phone]  If 7PM-7AM, please contact night-coverage [email or web address] Password [email or web address] 11/04/2015, 12:23 PM   LOS: 4 days

## 2015-11-04 NOTE — [place] FL2 (Deleted)
[place] MEDICAID FL2 LEVEL OF CARE SCREENING TOOL     IDENTIFICATION  Patient Name: [name] [name] Birthdate: Feb 22, 1936 Sex: male Admission Date (Current Location): 10/30/2015  [place] and [place] Number:  Haynes Bast [id number] T Facility and Address:  [facility], [address], [place], [place] [address]      Provider Number: [id number]  Attending Physician Name and Address:  Catarina Hartshorn, MD  Relative Name and Phone Number:  Kejon Feild [phone])    Current Level of Care: Hospital Recommended Level of Care: Skilled Nursing Facility Prior Approval Number:    Date Approved/Denied:   PASRR Number: [id number]  Discharge Plan: SNF    Current Diagnoses: Patient Active Problem List   Diagnosis Date Noted  . Transaminasemia   . FUO (fever of unknown origin)   . Pressure ulcer 11/01/2015  . HCAP (healthcare-associated pneumonia) 10/25/2015  . Acute respiratory failure with hypoxia (HCC) 10/24/2015  . Atrial fibrillation with RVR (HCC) 10/23/2015  . Pyrexia   . Sepsis (HCC) 10/21/2015  . Altered mental status 10/20/2015  . Thrombocytopenia (HCC) 08/29/2015  . Long term (current) use of anticoagulants 08/03/2015  . Atrial fibrillation (HCC) 07/30/2015  . CAP (community acquired pneumonia) 07/23/2015  . Bladder outflow obstruction   . Ischemic cardiomyopathy 04/13/2011  . Osteoarthritis severe bilateral knees 04/06/2010  . LEG CRAMPS, IDIOPATHIC 04/06/2010  . Congestive heart failure (HCC) 09/07/2008  . BACK PAIN 03/20/2008  . Type II diabetes mellitus with renal manifestations (HCC) 10/31/2007  . ESRD on dialysis (HCC) 10/31/2007  . Coronary atherosclerosis 09/25/2007  . Hypothyroidism 04/09/2007  . Hyperlipidemia 04/09/2007  . GOUT 04/09/2007  . Essential hypertension 04/09/2007  . CEREBROVASCULAR ACCIDENT, HX OF 04/09/2007    Orientation RESPIRATION BLADDER Height & Weight    Self, Place  Normal Continent   156 lbs.   BEHAVIORAL SYMPTOMS/MOOD NEUROLOGICAL BOWEL NUTRITION STATUS      Incontinent Diet (Renal diet with fluid restriction : , Thin fluids )  AMBULATORY STATUS COMMUNICATION OF NEEDS Skin   Extensive Assist Does not communicate PU Stage and Appropriate Care                       Personal Care Assistance Level of Assistance  Bathing, Feeding, Dressing, Total care Bathing Assistance: Maximum assistance Feeding assistance: Limited assistance Dressing Assistance: Maximum assistance Total Care Assistance: Maximum assistance   Functional Limitations Info  Sight, Hearing, Speech Sight Info: Adequate Hearing Info: Adequate Speech Info: Adequate    SPECIAL CARE FACTORS FREQUENCY                       Contractures      Additional Factors Info  Code Status, Allergies, Insulin Sliding Scale Code Status Info: FULL CODE  Allergies Info: Tomatoes (Too acidic)   Insulin Sliding Scale Info: 0-9 units, Subcutaneous, 3x daily with meals        Current Medications (11/04/2015):  This is the current hospital active medication list Current Facility-Administered Medications  Medication Dose Route Frequency Provider Last Rate Last Dose  . acetaminophen (TYLENOL) suppository 650 mg  650 mg Rectal Q4H PRN Zannie Cove, MD   650 mg at 11/02/15 1525  . acetaminophen (TYLENOL) tablet 650 mg  650 mg Oral Q6H PRN Hillary Bow, DO   650 mg at 11/02/15 2042  . calcium acetate (PHOSLO) capsule 667 mg  667 mg Oral TID WC Hillary Bow, DO   667 mg at 11/04/15 0840  .  carvedilol (COREG) tablet 3.125 mg  3.125 mg Oral BID WC Catarina Hartshorn, MD   3.125 mg at 11/04/15 0840  . cyanocobalamin tablet 500 mcg  500 mcg Oral Daily Hillary Bow, DO   500 mcg at 11/04/15 1028  . [START ON 11/05/2015] Darbepoetin Alfa (ARANESP) injection 40 mcg  40 mcg Intravenous Q Fri-HD Weston Settle, PA-C      . doxercalciferol (HECTOROL) injection 1 mcg  1 mcg Intravenous Q M,W,F-HD Weston Settle, PA-C   1 mcg at  11/03/15 1004  . feeding supplement (NEPRO CARB STEADY) liquid 237 mL  237 mL Oral BID BM Weston Settle, PA-C   237 mL at 11/04/15 1000  . insulin aspart (novoLOG) injection 0-9 Units  0-9 Units Subcutaneous TID WC Hillary Bow, DO   1 Units at 11/03/15 1723  . levothyroxine (SYNTHROID, LEVOTHROID) tablet 175 mcg  175 mcg Oral QAC breakfast Hillary Bow, DO   175 mcg at 11/04/15 0840  . meropenem (MERREM) 500 mg in sodium chloride 0.9 % 50 mL IVPB  500 mg Intravenous Q24H Lauren D Bajbus, RPH   500 mg at 11/03/15 2055  . multivitamin (RENA-VIT) tablet 1 tablet  1 tablet Oral Daily Hillary Bow, DO   1 tablet at 11/04/15 1028  . sodium chloride 0.9 % injection 3 mL  3 mL Intravenous Q12H Hillary Bow, DO   3 mL at 11/04/15 1610     Discharge Medications: Please see discharge summary for a list of discharge medications.  Relevant Imaging Results:  Relevant Lab Results:   Additional Information    Baldo Daub, Student-SW [id number]

## 2015-11-04 NOTE — [place] FL2 (Signed)
[place] MEDICAID FL2 LEVEL OF CARE SCREENING TOOL     IDENTIFICATION  Patient Name: [name] [name] Birthdate: 1936/04/18 Sex: male Admission Date (Current Location): 10/30/2015  [place] and [place] Number:  Haynes Bast [id number] T Facility and Address:  [facility], [address] [place], [place] [address]      Provider Number: [id number]  Attending Physician Name and Address:  Catarina Hartshorn, MD  Relative Name and Phone Number:  Wyland Rastetter [phone])    Current Level of Care: Hospital Recommended Level of Care: Skilled Nursing Facility Prior Approval Number:    Date Approved/Denied:   PASRR Number: [id number]  Discharge Plan: SNF    Current Diagnoses: Patient Active Problem List   Diagnosis Date Noted  . Transaminasemia   . FUO (fever of unknown origin)   . Pressure ulcer 11/01/2015  . HCAP (healthcare-associated pneumonia) 10/25/2015  . Acute respiratory failure with hypoxia (HCC) 10/24/2015  . Atrial fibrillation with RVR (HCC) 10/23/2015  . Pyrexia   . Sepsis (HCC) 10/21/2015  . Altered mental status 10/20/2015  . Thrombocytopenia (HCC) 08/29/2015  . Long term (current) use of anticoagulants 08/03/2015  . Atrial fibrillation (HCC) 07/30/2015  . CAP (community acquired pneumonia) 07/23/2015  . Bladder outflow obstruction   . Ischemic cardiomyopathy 04/13/2011  . Osteoarthritis severe bilateral knees 04/06/2010  . LEG CRAMPS, IDIOPATHIC 04/06/2010  . Congestive heart failure (HCC) 09/07/2008  . BACK PAIN 03/20/2008  . Type II diabetes mellitus with renal manifestations (HCC) 10/31/2007  . ESRD on dialysis (HCC) 10/31/2007  . Coronary atherosclerosis 09/25/2007  . Hypothyroidism 04/09/2007  . Hyperlipidemia 04/09/2007  . GOUT 04/09/2007  . Essential hypertension 04/09/2007  . CEREBROVASCULAR ACCIDENT, HX OF 04/09/2007    Orientation RESPIRATION BLADDER Height & Weight    Self, Place  Normal Continent   156 lbs.   BEHAVIORAL SYMPTOMS/MOOD NEUROLOGICAL BOWEL NUTRITION STATUS      Incontinent Diet (Renal diet with fluid restriction : , Thin fluids )  AMBULATORY STATUS COMMUNICATION OF NEEDS Skin   Extensive Assist Does not communicate PU Stage and Appropriate Care  Stage 2 pressure ulcer on left buttocks. Foam dressing.                      Personal Care Assistance Level of Assistance  Bathing, Feeding, Dressing, Total care Bathing Assistance: Maximum assistance Feeding assistance: Limited assistance Dressing Assistance: Maximum assistance Total Care Assistance: Maximum assistance   Functional Limitations Info  Sight, Hearing, Speech Sight Info: Adequate Hearing Info: Adequate Speech Info: Adequate    SPECIAL CARE FACTORS FREQUENCY   Physical therapy Occupational therapy     Evaluated: 11-02-15, Minimum of 2x a week (recommended)  Evaluated: 11-02-15, Minimum of 2x a week (recommended)                Contractures      Additional Factors Info  Code Status, Allergies, Insulin Sliding Scale Code Status Info: FULL CODE  Allergies Info: Tomatoes (Too acidic)   Insulin Sliding Scale Info: 0-9 units, Subcutaneous, 3x daily with meals        Current Medications (11/04/2015):  This is the current hospital active medication list Current Facility-Administered Medications  Medication Dose Route Frequency Provider Last Rate Last Dose  . acetaminophen (TYLENOL) suppository 650 mg  650 mg Rectal Q4H PRN Zannie Cove, MD   650 mg at 11/02/15 1525  . acetaminophen (TYLENOL) tablet 650 mg  650 mg Oral Q6H PRN Hillary Bow, DO   [id number]  mg at 11/02/15 2042  . calcium acetate (PHOSLO) capsule 667 mg  667 mg Oral TID WC Hillary Bow, DO   667 mg at 11/04/15 0840  . carvedilol (COREG) tablet 6.25 mg  6.25 mg Oral BID WC Catarina Hartshorn, MD      . cyanocobalamin tablet 500 mcg  500 mcg Oral Daily Hillary Bow, DO   500 mcg at 11/04/15 1028  . [START ON 11/05/2015] Darbepoetin Alfa (ARANESP)  injection 40 mcg  40 mcg Intravenous Q Fri-HD Weston Settle, PA-C      . doxercalciferol (HECTOROL) injection 1 mcg  1 mcg Intravenous Q M,W,F-HD Weston Settle, PA-C   1 mcg at 11/03/15 1004  . feeding supplement (NEPRO CARB STEADY) liquid 237 mL  237 mL Oral BID BM Weston Settle, PA-C   237 mL at 11/04/15 1000  . insulin aspart (novoLOG) injection 0-9 Units  0-9 Units Subcutaneous TID WC Hillary Bow, DO   1 Units at 11/04/15 1320  . levothyroxine (SYNTHROID, LEVOTHROID) tablet 175 mcg  175 mcg Oral QAC breakfast Hillary Bow, DO   175 mcg at 11/04/15 0840  . meropenem (MERREM) 500 mg in sodium chloride 0.9 % 50 mL IVPB  500 mg Intravenous Q24H Lauren D Bajbus, RPH   500 mg at 11/03/15 2055  . multivitamin (RENA-VIT) tablet 1 tablet  1 tablet Oral Daily Hillary Bow, DO   1 tablet at 11/04/15 1028  . sodium chloride 0.9 % injection 3 mL  3 mL Intravenous Q12H Hillary Bow, DO   3 mL at 11/04/15 1610     Discharge Medications: Please see discharge summary for a list of discharge medications.  Relevant Imaging Results:  Relevant Lab Results:   Additional Information  Dialysis - MWF at [facility]   [place], [place] [phone]

## 2015-11-04 NOTE — Clinical Social Work Placement (Signed)
   CLINICAL SOCIAL WORK PLACEMENT  NOTE  Date:  11/04/2015  Patient Details  Name: [name] [name] MRN: [id number] Date of Birth: October 29, 1935  Clinical Social Work is seeking post-discharge placement for this patient at the Skilled  Nursing Facility level of care (*CSW will initial, date and re-position this form in  chart as items are completed):  Yes   Patient/family provided with [facility] Clinical Social Work Department's list of facilities offering this level of care within the geographic area requested by the patient (or if unable, by the patient's family).  Yes   Patient/family informed of their freedom to choose among providers that offer the needed level of care, that participate in Medicare, Medicaid or managed care program needed by the patient, have an available bed and are willing to accept the patient.  Yes   Patient/family informed of [facility]'s ownership interest in [facility] and [facility], as well as of the fact that they are under no obligation to receive care at these facilities.  PASRR submitted to EDS on       PASRR number received on 11/04/15     Existing PASRR number confirmed on 11/04/15     FL2 transmitted to all facilities in geographic area requested by pt/family on 11/04/15     FL2 transmitted to all facilities within larger geographic area on 11/04/15     Patient informed that his/her managed care company has contracts with or will negotiate with certain facilities, including the following:            Patient/family informed of bed offers received.  Patient chooses bed at       Physician recommends and patient chooses bed at      Patient to be transferred to   on  .  Patient to be transferred to facility by       Patient family notified on   of transfer.  Name of family member notified:        PHYSICIAN       Additional Comment:    _______________________________________________ Baldo Daub, Student-SW 11/04/2015, 2:00  PM

## 2015-11-04 NOTE — Progress Notes (Signed)
Patient's son [name] [phone]) called wanting the MD to call him with update on MRI & CT.  MD notified.

## 2015-11-05 ENCOUNTER — Encounter (HOSPITAL_COMMUNITY): Payer: Medicare Other

## 2015-11-05 ENCOUNTER — Inpatient Hospital Stay (HOSPITAL_COMMUNITY): Payer: Medicare Other

## 2015-11-05 DIAGNOSIS — E1122 Type 2 diabetes mellitus with diabetic chronic kidney disease: Secondary | ICD-10-CM

## 2015-11-05 LAB — CBC WITH DIFFERENTIAL/PLATELET
BASOS PCT: 1 %
Basophils Absolute: 0.1 10*3/uL (ref 0.0–0.1)
EOS ABS: 0.3 10*3/uL (ref 0.0–0.7)
Eosinophils Relative: 5 %
HCT: 33.9 % — ABNORMAL LOW (ref 39.0–52.0)
Hemoglobin: 10.9 g/dL — ABNORMAL LOW (ref 13.0–17.0)
Lymphocytes Relative: 22 %
Lymphs Abs: 1.4 10*3/uL (ref 0.7–4.0)
MCH: 29.5 pg (ref 26.0–34.0)
MCHC: 32.2 g/dL (ref 30.0–36.0)
MCV: 91.6 fL (ref 78.0–100.0)
MONO ABS: 0.6 10*3/uL (ref 0.1–1.0)
Monocytes Relative: 10 %
NEUTROS ABS: 3.9 10*3/uL (ref 1.7–7.7)
NEUTROS PCT: 62 %
PLATELETS: 104 10*3/uL — AB (ref 150–400)
RBC: 3.7 MIL/uL — ABNORMAL LOW (ref 4.22–5.81)
RDW: 18.3 % — AB (ref 11.5–15.5)
WBC: 6.3 10*3/uL (ref 4.0–10.5)

## 2015-11-05 LAB — CULTURE, BLOOD (ROUTINE X 2)
CULTURE: NO GROWTH
Culture: NO GROWTH

## 2015-11-05 LAB — COMPREHENSIVE METABOLIC PANEL
ALT: 94 U/L — ABNORMAL HIGH (ref 17–63)
ANION GAP: 15 (ref 5–15)
AST: 715 U/L — ABNORMAL HIGH (ref 15–41)
Albumin: 1.9 g/dL — ABNORMAL LOW (ref 3.5–5.0)
Alkaline Phosphatase: 247 U/L — ABNORMAL HIGH (ref 38–126)
BUN: 52 mg/dL — ABNORMAL HIGH (ref 6–20)
CHLORIDE: 97 mmol/L — AB (ref 101–111)
CO2: 21 mmol/L — ABNORMAL LOW (ref 22–32)
Calcium: 9.1 mg/dL (ref 8.9–10.3)
Creatinine, Ser: 6.93 mg/dL — ABNORMAL HIGH (ref 0.61–1.24)
GFR, EST AFRICAN AMERICAN: 8 mL/min — AB (ref >60)
GFR, EST NON AFRICAN AMERICAN: 7 mL/min — AB (ref >60)
Glucose, Bld: 128 mg/dL — ABNORMAL HIGH (ref 65–99)
POTASSIUM: 5 mmol/L (ref 3.5–5.1)
Sodium: 133 mmol/L — ABNORMAL LOW (ref 135–145)
Total Bilirubin: 0.8 mg/dL (ref 0.3–1.2)
Total Protein: 6.8 g/dL (ref 6.5–8.1)

## 2015-11-05 LAB — PROTIME-INR
INR: 2.21 — AB (ref 0.00–1.49)
PROTHROMBIN TIME: 24.3 s — AB (ref 11.6–15.2)

## 2015-11-05 LAB — CBC
HCT: 35.3 % — ABNORMAL LOW (ref 39.0–52.0)
Hemoglobin: 10.8 g/dL — ABNORMAL LOW (ref 13.0–17.0)
MCH: 28.6 pg (ref 26.0–34.0)
MCHC: 30.6 g/dL (ref 30.0–36.0)
MCV: 93.4 fL (ref 78.0–100.0)
Platelets: 125 10*3/uL — ABNORMAL LOW (ref 150–400)
RBC: 3.78 MIL/uL — ABNORMAL LOW (ref 4.22–5.81)
RDW: 18.3 % — ABNORMAL HIGH (ref 11.5–15.5)
WBC: 4.6 10*3/uL (ref 4.0–10.5)

## 2015-11-05 LAB — GLUCOSE, CAPILLARY
GLUCOSE-CAPILLARY: 78 mg/dL (ref 65–99)
GLUCOSE-CAPILLARY: 111 mg/dL — AB (ref 65–99)
GLUCOSE-CAPILLARY: 139 mg/dL — AB (ref 65–99)

## 2015-11-05 MED ORDER — PENTAFLUOROPROP-TETRAFLUOROETH EX AERO: 1.0000 "application " | INHALATION_SPRAY | CUTANEOUS | Status: DC | PRN

## 2015-11-05 MED ORDER — VITAMIN K1 10 MG/ML IJ SOLN
10.0000 mg | Freq: Once | INTRAVENOUS | Status: AC
  Administered 2015-11-05: 10 mg via INTRAVENOUS
  Filled 2015-11-05: qty 1

## 2015-11-05 MED ORDER — DARBEPOETIN ALFA 40 MCG/0.4ML IJ SOSY
PREFILLED_SYRINGE | INTRAMUSCULAR | Status: AC
  Administered 2015-11-05: 40 ug via INTRAVENOUS
  Filled 2015-11-05: qty 0.4

## 2015-11-05 MED ORDER — LIDOCAINE-PRILOCAINE 2.5-2.5 % EX CREA
1.0000 "application " | TOPICAL_CREAM | CUTANEOUS | Status: DC | PRN
  Filled 2015-11-05: qty 5

## 2015-11-05 MED ORDER — DOXERCALCIFEROL 4 MCG/2ML IV SOLN
INTRAVENOUS | Status: AC
Start: 2015-11-05 — End: 2015-11-05
  Administered 2015-11-05: 1 ug via INTRAVENOUS
  Filled 2015-11-05: qty 2

## 2015-11-05 MED ORDER — TECHNETIUM TC 99M MEBROFENIN IV KIT
5.0000 | PACK | Freq: Once | INTRAVENOUS | Status: AC | PRN
  Administered 2015-11-05: 5 via INTRAVENOUS

## 2015-11-05 MED ORDER — SODIUM CHLORIDE 0.9 % IV SOLN: 100.0000 mL | INTRAVENOUS | Status: DC | PRN

## 2015-11-05 MED ORDER — HEPARIN SODIUM (PORCINE) 1000 UNIT/ML DIALYSIS: 20.0000 [IU]/kg | INTRAMUSCULAR | Status: DC | PRN

## 2015-11-05 MED ORDER — ALTEPLASE 2 MG IJ SOLR
2.0000 mg | Freq: Once | INTRAMUSCULAR | Status: DC | PRN
  Filled 2015-11-05: qty 2

## 2015-11-05 MED ORDER — HEPARIN SODIUM (PORCINE) 1000 UNIT/ML DIALYSIS: 1000.0000 [IU] | INTRAMUSCULAR | Status: DC | PRN

## 2015-11-05 MED ORDER — LIDOCAINE HCL (PF) 1 % IJ SOLN: 5.0000 mL | INTRAMUSCULAR | Status: DC | PRN

## 2015-11-05 NOTE — Progress Notes (Signed)
PT Cancellation Note  Patient Details Name: [name] [name] MRN: [id number] DOB: Jul 16, 1936   Cancelled Treatment:    Reason Eval/Treat Not Completed: Patient at procedure or test/unavailable  Patient was in dialysis this AM. He was off unit with radiology when PT checked back this afternoon. Will continue to follow and check on as time allows.  Berton Mount 11/05/2015, 3:10 PM  [address] [place], [place] [phone]

## 2015-11-05 NOTE — Progress Notes (Addendum)
PROGRESS NOTE  [name] [name] NGE:[id number] DOB: 03/04/1936 DOA: 10/30/2015 PCP: [name], MD Admit HPI / Brief Narrative: 80 y.o. M Hx CAD, HTN, HLD, CHF, ESRD dialized M/W/F, hypothyroidism, DM2, and a HCAP admission w/ discharge 1/9 on fortaz and vanc with dialysis (last dose 1/13) who was brought in by family with confusion, and decreased appetitie for a couple of days. In the ED he was febrile to 102.6. He was given zosyn, vanc, and IVF, and his mental status improved.    Assessment/Plan:  Sepsis of unclear etiology/FUO -Has a hemodialysis catheter which could potentially be the source, continue IV vancomycin, meropenem, fluconazole  urine culture with greater than 100,000 colonies of yeast-->unclear clinical significance -Follow-up blood cultures--neg to date -CT abd/pelvis--right splenic cyst, no abscess, no colonic inflammatory changes, cholelithiasis without cholecystitis -CT chest--right hilar and right paratracheal lymph node; loculated pleural effusion right oblique fissure without consolidation -MRI left knee irregular meniscal tear of the posterior horn with moderately complex joint effusion -RUQ ultrasound  metabolic encephalopathy in the setting of above/?underlying cognitive impairment -due to fever ?infectious process -ammonia--48 -started lactulose -MR brain  Left Knee pain -check uric acid--5.9 -MR left knee--?significant effusion to tap--hesitate to tap blindly in pt on warfarin -discussed with IR, Dr. Konrad [name] to attempt arthrocentesis if warfarin is reversed -1/19--Vitamin K 10 mg IV 1--> recheck INR 11/05/2015 -11/05/15--repeat vitamin K  IV x 1--recheck INR on 11/06/15  ESRD on HD M/W/F HD fistula placed 09/03/15 in L UE w/ good thrill to palpation -HD catheter and right chest, follow-up blood cultures, HD per renal -appreciate nephrology Warfarin induced coagulopathy Dosing per Rx - follow  -Reverse warfarin as  discussed above  AST elevation  -Likely due to hepatic congestion from chronic right heart failure -Discontinue fluconazole and Crestor -RUQ ultrasound with GB thickening -HIDA--neg for cystic duct or CBD obstruction  Chronic Afib w/ acute RVR  CHADS-VASc 8 - on chronic warfarin - rate controlled  -10/24/15--Echo EF 40-45%, severe TR -Restart carvedilol-->increase dose to 6.25 mg twice a day -rate controlled  Chronic systolic CHF  Volume management per HD  -10/24/15--Echo EF 40-45%, severe TR -Restart carvedilol lower dose  Elevated troponin - CAD s/p PCI RCA  Trop peaked at 0.56 - most c/w demand ischemia/rate related - no chest pain   DM2 -Hemoglobin A1c--5.9 -Patient normally takes glipizide at home -[employer]ovo[employer] sliding scale  HTN BP currently well controlled    Hypothyroidism s/p thyroidectomy  Cont home synthroid dose   Anemia of CKD -B12 marginally low -supplement  Stage 1 sacral wound -not infected on exam -consult wound care -present at time of admission  Code Status: FULL Family Communication: updated nephew at bedside 11/05/15 Disposition Plan: Home versus SNF, pending workup  Consultants: Nephrology   Procedures: 1/8 TTE - EF 40-45% - hypokinesis anteroseptal myocardium - mild mitral valve regurg - LA mildly dilated - RA mod dilated - severe TR - PA pressure 39mm Hg  Antibiotics: Zosyn (prior to admit)  Vanc (prior to admit) > Meropenem 1/15 >  Procedures/Studies: Dg Chest 2 View  10/24/2015  CLINICAL DATA:  Nonproductive cough, hypoxemia. EXAM: CHEST  2 VIEW COMPARISON:  Chest x-rays dated 10/20/2015 and 09/04/2015. FINDINGS: Fairly severe cardiomegaly is unchanged. Right chest wall dual lumen catheter is stable in position with tip projected over the right atrium. Central pulmonary vascular congestion and mild bilateral interstitial edema is unchanged, suspected to be chronic. New confluent airspace opacity is seen  at the right lung base.  IMPRESSION: 1. New confluent airspace opacity at the right lung base, pneumonia versus aspiration. 2. Stable cardiomegaly. The mild central pulmonary vascular congestion and bilateral interstitial edema is not significantly changed, most likely related to a chronic mild CHF. Electronically Signed   By: Bary Richard M.D.   On: 10/24/2015 11:10   Dg Chest 2 View  10/20/2015  CLINICAL DATA:  Acute onset of altered mental status. Tachycardia. Initial encounter. EXAM: CHEST  2 VIEW COMPARISON:  Chest radiograph performed 09/04/2015 FINDINGS: The lungs are well-aerated. Vascular congestion is noted. A small left pleural effusion is suspected. Increased interstitial markings likely reflect pulmonary edema. There is no evidence of pneumothorax. The heart is enlarged. No acute osseous abnormalities are seen. A right-sided dual-lumen catheter is noted ending about the cavoatrial junction. IMPRESSION: Vascular congestion and cardiomegaly. Small left pleural effusion suspected. Increased interstitial markings likely reflect pulmonary edema. Electronically Signed   By: [name] M.D.   On: 10/20/2015 22:00   Ct Head Wo Contrast  10/20/2015  CLINICAL DATA:  Altered mental status EXAM: CT HEAD WITHOUT CONTRAST TECHNIQUE: Contiguous axial images were obtained from the base of the skull through the vertex without intravenous contrast. COMPARISON:  Head CT July 19, 2015; brain MRI July 20, 2015 FINDINGS: There is stable age related volume loss. There is no intracranial mass, hemorrhage, extra-axial fluid collection, or midline shift. There is stable slight small vessel disease in the centra semiovale bilaterally. Elsewhere gray-white compartments are normal. No acute infarct is evident. Calcification along the falx is stable. Bony calvarium appears intact. The mastoid air cells are clear. No intraorbital lesions are identified. IMPRESSION: Slight periventricular small vessel disease, stable. No intracranial mass,  hemorrhage, or new gray - white compartment lesions/acute appearing infarct. Stable appearance compared to prior studies. Electronically Signed   By: Bretta Bang III M.D.   On: 10/20/2015 21:26   Ct Chest W Contrast  11/04/2015  CLINICAL DATA:  Fever of unknown origin. History of coronary disease hypertension, congestive heart failure chronic renal disease. EXAM: CT CHEST, ABDOMEN, AND PELVIS WITH CONTRAST TECHNIQUE: Multidetector CT imaging of the chest, abdomen and pelvis was performed following the standard protocol during bolus administration of intravenous contrast. CONTRAST:  OMNIPAQUE IOHEXOL 300 MG/ML  SOLN COMPARISON:  Chest radiograph 10/24/2015, 10/20/2015 on CT thorax 03/12/2004 FINDINGS: CT CHEST FINDINGS Mediastinum/Nodes: No axillary or supraclavicular adenopathy. The paratracheal adenopathy is present. Example 16 mm short axis RIGHT lower paratracheal lymph node which is increased from 12 mm on prior. RIGHT hilar lymph node measures 20 mm on image 25 series 201 increased from 18 mm. AP window node measures 9 mm compared to 11 mm. No central pulmonary embolism. Borderline enlarged LEFT supraclavicular lymph node measures 11 mm (image 5, series 201) Cardiomegaly noted. Lungs/Pleura: There is interlobular septal thickening in the lungs. Extensive centrilobular emphysema. There is loculated fluid collections along the RIGHT oblique fissure. No consolidation to suggest active pulmonary infection. Musculoskeletal: No aggressive osseous lesion. CT ABDOMEN AND PELVIS FINDINGS Hepatobiliary: There is reflux from the injection bolus into the hepatic veins consistent with RIGHT heart insufficiency. No focal hepatic lesion. Several gallstones collect within the decompressed gallbladder. No biliary duct dilatation. Pancreas: Pancreas is normal. No ductal dilatation. No pancreatic inflammation. Spleen: Low-attenuation lesion within the spleen is rounded measuring 3.8 cm. This is new from 2005.  Adrenals/urinary tract: Adrenal glands are normal. Poor renal cortical enhancement. High-density lesion measuring 8 mm in the RIGHT renal cortex likely  represents hemorrhagic cyst. Ureters and bladder normal. The bladder is collapsed Stomach/Bowel: Stomach, small bowel and cecum are normal. The appendix is not identified. Small amount fluid inferior to the cecal tip (image 99 of the axial series). Colon rectosigmoid colon are normal. No evidence of colonic inflammation or infection Vascular/Lymphatic: Abdominal aorta is normal caliber with atherosclerotic calcification. There is no retroperitoneal or periportal lymphadenopathy. No pelvic lymphadenopathy. Reproductive: Prostate normal Other: No abscess in the abdomen pelvis. Musculoskeletal: No aggressive osseous lesion. Gas in the L4-L5 disc space likely relates to degenerative change. There is significant arthropathy at the facet joints at this level. IMPRESSION: Chest Impression: 1. Progressive mediastinal and hilar lymphadenopathy from CT 2005. This may relate to congestive heart failure. Cannot exclude lymphoproliferative process such as CLL. No significant adenopathy in the abdomen or axilla would favor against lymphoma. 2. Cardiomegaly and loculated pleural effusions with interlobular septal thickening likely relates to congestive heart failure. RIGHT heart insufficiency noted with contrast reflux into hepatic veins. 3. Centrilobular emphysema. 4. No evidence of acute pulmonary infection. Abdomen / Pelvis Impression: 1. Large round lesion within the spleen is new from 2005. In patient with fever of unknown origin cannot exclude a splenic abscess. No evidence of enhancement with favor benign cyst. Consider MRI of the abdomen with contrast. Of note, if patient cannot hold breath then MRI may not be a suitable imaging modality. In that case, consider FDG PET scan for evaluation if clinical suspicion warrants. 2. No evidence of abscess or inflammation the  peritoneal space. 3. Small amount of free fluid along the RIGHT pericolic gutter likely relates to heart failure described above. 4. Cholelithiasis without evidence acute cholecystitis. Electronically Signed   By: Genevive Bi M.D.   On: 11/04/2015 08:29   Ct Abdomen Pelvis W Contrast  11/04/2015  CLINICAL DATA:  Fever of unknown origin. History of coronary disease hypertension, congestive heart failure chronic renal disease. EXAM: CT CHEST, ABDOMEN, AND PELVIS WITH CONTRAST TECHNIQUE: Multidetector CT imaging of the chest, abdomen and pelvis was performed following the standard protocol during bolus administration of intravenous contrast. CONTRAST:  OMNIPAQUE IOHEXOL 300 MG/ML  SOLN COMPARISON:  Chest radiograph 10/24/2015, 10/20/2015 on CT thorax 03/12/2004 FINDINGS: CT CHEST FINDINGS Mediastinum/Nodes: No axillary or supraclavicular adenopathy. The paratracheal adenopathy is present. Example 16 mm short axis RIGHT lower paratracheal lymph node which is increased from 12 mm on prior. RIGHT hilar lymph node measures 20 mm on image 25 series 201 increased from 18 mm. AP window node measures 9 mm compared to 11 mm. No central pulmonary embolism. Borderline enlarged LEFT supraclavicular lymph node measures 11 mm (image 5, series 201) Cardiomegaly noted. Lungs/Pleura: There is interlobular septal thickening in the lungs. Extensive centrilobular emphysema. There is loculated fluid collections along the RIGHT oblique fissure. No consolidation to suggest active pulmonary infection. Musculoskeletal: No aggressive osseous lesion. CT ABDOMEN AND PELVIS FINDINGS Hepatobiliary: There is reflux from the injection bolus into the hepatic veins consistent with RIGHT heart insufficiency. No focal hepatic lesion. Several gallstones collect within the decompressed gallbladder. No biliary duct dilatation. Pancreas: Pancreas is normal. No ductal dilatation. No pancreatic inflammation. Spleen: Low-attenuation lesion within  the spleen is rounded measuring 3.8 cm. This is new from 2005. Adrenals/urinary tract: Adrenal glands are normal. Poor renal cortical enhancement. High-density lesion measuring 8 mm in the RIGHT renal cortex likely represents hemorrhagic cyst. Ureters and bladder normal. The bladder is collapsed Stomach/Bowel: Stomach, small bowel and cecum are normal. The appendix is not identified. Small amount  fluid inferior to the cecal tip (image 99 of the axial series). Colon rectosigmoid colon are normal. No evidence of colonic inflammation or infection Vascular/Lymphatic: Abdominal aorta is normal caliber with atherosclerotic calcification. There is no retroperitoneal or periportal lymphadenopathy. No pelvic lymphadenopathy. Reproductive: Prostate normal Other: No abscess in the abdomen pelvis. Musculoskeletal: No aggressive osseous lesion. Gas in the L4-L5 disc space likely relates to degenerative change. There is significant arthropathy at the facet joints at this level. IMPRESSION: Chest Impression: 1. Progressive mediastinal and hilar lymphadenopathy from CT 2005. This may relate to congestive heart failure. Cannot exclude lymphoproliferative process such as CLL. No significant adenopathy in the abdomen or axilla would favor against lymphoma. 2. Cardiomegaly and loculated pleural effusions with interlobular septal thickening likely relates to congestive heart failure. RIGHT heart insufficiency noted with contrast reflux into hepatic veins. 3. Centrilobular emphysema. 4. No evidence of acute pulmonary infection. Abdomen / Pelvis Impression: 1. Large round lesion within the spleen is new from 2005. In patient with fever of unknown origin cannot exclude a splenic abscess. No evidence of enhancement with favor benign cyst. Consider MRI of the abdomen with contrast. Of note, if patient cannot hold breath then MRI may not be a suitable imaging modality. In that case, consider FDG PET scan for evaluation if clinical suspicion  warrants. 2. No evidence of abscess or inflammation the peritoneal space. 3. Small amount of free fluid along the RIGHT pericolic gutter likely relates to heart failure described above. 4. Cholelithiasis without evidence acute cholecystitis. Electronically Signed   By: Genevive Bi M.D.   On: 11/04/2015 08:29   Mr Knee Left  Wo Contrast  11/03/2015  CLINICAL DATA:  80 year old in patient on dialysis with left-greater-than-right knee pain. Fever of unknown origin. EXAM: MRI OF THE LEFT KNEE WITHOUT CONTRAST TECHNIQUE: Multiplanar, multisequence MR imaging of the knee was performed. No intravenous contrast was administered. COMPARISON:  None. FINDINGS: Study is mildly motion degraded. MENISCI Medial meniscus: Meniscal degeneration with irregular radial tear in the posterior horn. The meniscal root is intact. No centrally displaced meniscal fragment. Lateral meniscus:  Meniscal degeneration without discrete tear. LIGAMENTS Cruciates:  Intact with mild mucoid degeneration. Collaterals: Intact. There is a small amount of medial bursal fluid. The popliteus tendon is thickened near its femoral insertion. CARTILAGE Patellofemoral:  Adequately preserved. Medial: Mild chondral thinning and surface irregularity. There is mild subchondral edema and intraosseous ganglion formation throughout the medial tibial plateau. Lateral:  Adequately preserved. OTHER Joint: Moderate size complex joint effusion consistent with synovitis. No discrete intra-articular loose bodies observed. Popliteal Fossa:  Small mildly complex Baker's cyst. Extensor Mechanism: The distal quadriceps tendon appears normal. There is patellar tendinosis with a long segment of tubular T2 hyperintensity in the medial aspect of the tendon Bones: There are several intraosseous ganglia in the proximal tibia, largest posteriorly in the medial tibial plateau, measuring up to 1.6 cm. There is no evidence of periarticular bone marrow edema or osteomyelitis.  IMPRESSION: 1. Degenerative radial tear of the posterior horn of the medial meniscus. 2. Lateral meniscal degeneration without evidence of tear. 3. Complex joint effusion consistent with synovitis. No specific evidence of infection or osteomyelitis. Correlate clinically. 4. Intra tendinous cyst formation medially in the patellar tendon, likely a ganglion cyst. This could indicate underlying interstitial tendon tearing. Electronically Signed   By: Carey Bullocks M.D.   On: 11/03/2015 19:59   Nm Hepato W/eject Fract  11/05/2015  CLINICAL DATA:  Gallbladder wall thickening. Multiple stones. Elevated LFTs. EXAM: NUCLEAR  MEDICINE HEPATOBILIARY IMAGING TECHNIQUE: Sequential images of the abdomen were obtained out to 60 minutes following intravenous administration of radiopharmaceutical. RADIOPHARMACEUTICALS:  5.2 mCi Tc-65m  Choletec IV COMPARISON:  11/04/2015 ultrasound FINDINGS: There is prompt uptake and excretion of radiotracer by the liver. Activity is seen in the gallbladder by 30 minutes and small bowel by 55 minutes. IMPRESSION: No evidence of cystic duct or common bile duct obstruction. Electronically Signed   By: Charlett Nose M.D.   On: 11/05/2015 15:29   Dg Chest Port 1 View  10/30/2015  CLINICAL DATA:  Acute onset of shortness of breath. Initial encounter. EXAM: PORTABLE CHEST 1 VIEW COMPARISON:  Chest radiograph performed 10/24/2015 FINDINGS: The patient's right-sided dual-lumen catheter is noted ending overlying the right atrium. The lungs are well-aerated. Vascular congestion is noted. Increased interstitial markings raise concern for mild interstitial edema. A small left pleural effusion may be present. No pneumothorax is seen. The cardiomediastinal silhouette is mildly enlarged. No acute osseous abnormalities are seen. IMPRESSION: Vascular congestion and mild cardiomegaly. Increased interstitial markings raise concern for mild interstitial edema. Small left pleural effusion may be present.  Electronically Signed   By: [name] M.D.   On: 10/30/2015 23:54   US Abdomen Limited Ruq  11/04/2015  CLINICAL DATA:  Elevated liver function tests. EXAM: US ABDOMEN LIMITED - RIGHT UPPER QUADRANT COMPARISON:  Chest, abdomen and pelvis CT obtained earlier today. FINDINGS: Gallbladder: Diffuse gallbladder wall thickening measuring 7 mm in thickness. Multiple small echogenic foci adherent to the walls of the gallbladder without shadowing. Small amount of corresponding calcific density on the recent CT. Mild pericholecystic fluid. No tenderness over the gallbladder today. Common bile duct: Diameter: 2.0 mm Liver: Dilated hepatic veins. To and fro flow within the portal vein. Minimal ascites. IMPRESSION: 1. Diffuse gallbladder wall thickening. This could be due to right heart failure or chronic cholecystitis. 2. Multiple tiny, adherent stones in the gallbladder. 3. Dilated hepatic veins and to and fro flow in the portal vein, compatible with hepatic congestion due to right heart failure. 4. Minimal ascites and mild pericholecystic fluid. Electronically Signed   By: Beckie Salts M.D.   On: 11/04/2015 16:30         Subjective: Patient denies fevers, chills, headache, chest pain, dyspnea, nausea, vomiting, diarrhea, abdominal pain, dysuria, hematuria Denies any headache, neck pain, hematochezia, melena.  Objective: Filed Vitals:   11/05/15 1030 11/05/15 1057 11/05/15 1130 11/05/15 1234  BP: 114/80 115/70 111/73 119/60  Pulse: 67 95 90 89  Temp:   98.1 F (36.7 C)   TempSrc:   Oral   Resp: 12 14 16 16   Height:      Weight:      SpO2:    100%    Intake/Output Summary (Last 24 hours) at 11/05/15 1645 Last data filed at 11/05/15 1400  Gross per 24 hour  Intake    460 ml  Output      0 ml  Net    460 ml   Weight change: 3.1 kg (6 lb 13.4 oz) Exam:   General:  Pt is alert, follows commands appropriately, not in acute distress  HEENT: No icterus, No thrush, No neck mass,  [place]/AT  Cardiovascular: RRR, S1/S2, no rubs, no gallops  Respiratory: CTA bilaterally, no wheezing, no crackles, no rhonchi  Abdomen: Soft/+BS, non tender, non distended, no guarding; no hepatosplenomegaly  Extremities: No edema, No lymphangitis, No petechiae, No rashes, no synovitis; no clubbing or cyanosis  Data Reviewed: Basic Metabolic Panel:  Recent Labs Lab 10/31/15 1108 11/01/15 0212 11/02/15 0644 11/03/15 0122 11/03/15 0900 11/04/15 0554 11/05/15 0526  NA 137 135 137 136  --  135 133*  K 4.6 3.8 3.9 4.2  --  4.8 5.0  CL 99* 100* 98* 97*  --  100* 97*  CO2 22 23 26 25   --  24 21*  GLUCOSE 73 271* 82 229*  --  101* 128*  BUN 48* 56* 21* 38*  --  33* 52*  CREATININE 6.78* 7.84* 4.72* 6.14*  --  5.32* 6.93*  CALCIUM 8.8* 8.2* 8.1* 8.5*  --  8.9 9.1  MG  --  2.1  --   --   --   --   --   PHOS 5.8*  --   --   --  4.0  --   --    Liver Function Tests:  Recent Labs Lab 11/01/15 0212 11/02/15 0644 11/03/15 0122 11/04/15 0554 11/05/15 0526  AST 105* 133* 171* 484* 715*  ALT 16* 25 33 66* 94*  ALKPHOS 115 132* 164* 192* 247*  BILITOT 0.4 0.8 0.6 0.9 0.8  PROT 5.9* 6.6 6.4* 7.1 6.8  ALBUMIN 1.7* 1.8* 1.7* 1.9* 1.9*   No results for input(s): LIPASE, AMYLASE in the last 168 hours.  Recent Labs Lab 11/04/15 1355  AMMONIA 48*   CBC:  Recent Labs Lab 10/30/15 2357  11/01/15 0212 11/02/15 0644 11/03/15 0122 11/04/15 0554 11/05/15 0526  WBC 6.8  < > 6.8 5.4 6.1 6.8 6.3  NEUTROABS 4.4  --  4.5 3.0  --  3.8 3.9  HGB 12.0*  < > 10.2* 10.6* 10.2* 10.9* 10.9*  HCT 38.6*  < > 31.9* 34.9* 33.6* 34.7* 33.9*  MCV 95.3  < > 93.3 95.4 94.1 94.3 91.6  PLT 165  < > 119* 122* 123* 131* 104*  < > = values in this interval not displayed. Cardiac Enzymes:  Recent Labs Lab 10/31/15 0834 10/31/15 1410 10/31/15 2010 11/01/15 0212 11/01/15 1226  TROPONINI 0.58* 0.56* 0.39* 0.36* 0.25*   BNP: Invalid input(s): POCBNP CBG:  Recent Labs Lab 11/04/15 0737  11/04/15 1153 11/04/15 1646 11/04/15 2106 11/05/15 1252  GLUCAP 95 155* 108* 137* 78    Recent Results (from the past 240 hour(s))  Blood Culture (routine x 2)     Status: None   Collection Time: 10/31/15 12:45 AM  Result Value Ref Range Status   Specimen Description BLOOD RIGHT HAND  Final   Special Requests IN PEDIATRIC BOTTLE 3CC  Final   Culture NO GROWTH 5 DAYS  Final   Report Status 11/05/2015 FINAL  Final  Blood Culture (routine x 2)     Status: None   Collection Time: 10/31/15 12:49 AM  Result Value Ref Range Status   Specimen Description BLOOD LEFT HAND  Final   Special Requests IN PEDIATRIC BOTTLE 4CC  Final   Culture NO GROWTH 5 DAYS  Final   Report Status 11/05/2015 FINAL  Final  Urine culture     Status: None   Collection Time: 10/31/15  2:02 AM  Result Value Ref Range Status   Specimen Description URINE, CATHETERIZED  Final   Special Requests NONE  Final   Culture >=100,000 COLONIES/mL YEAST  Final   Report Status 11/01/2015 FINAL  Final     Scheduled Meds: . calcium acetate  667 mg Oral TID WC  . carvedilol  6.25 mg Oral BID WC  . vitamin B-12  500 mcg Oral Daily  . darbepoetin (  ARANESP) injection - DIALYSIS  40 mcg Intravenous Q Fri-HD  . doxercalciferol  1 mcg Intravenous Q M,W,F-HD  . feeding supplement (NEPRO CARB STEADY)  237 mL Oral BID BM  . insulin aspart  0-9 Units Subcutaneous TID WC  . lactulose  20 g Oral BID  . levothyroxine  175 mcg Oral QAC breakfast  . meropenem (MERREM) IV  500 mg Intravenous Q24H  . multivitamin  1 tablet Oral Daily  . phytonadione (VITAMIN K) IV  10 mg Intravenous Once  . sodium chloride  3 mL Intravenous Q12H   Continuous Infusions:    Davette Nugent, DO  [facility] Pager [phone]  If 7PM-7AM, please contact night-coverage [email or web address] Password [email or web address] 11/05/2015, 4:45 PM   LOS: 5 days

## 2015-11-05 NOTE — Progress Notes (Signed)
OT Cancellation Note  Patient Details Name: [name] [name] MRN: [id number] DOB: 1935/11/25   Cancelled Treatment:     OT treatment session attempted this morning. Pt currently off floor in HD, will re-attempt as able for OT.   Charletta Cousin, Amy Beth Dixon, OTR/L 11/05/2015, 7:50 AM

## 2015-11-05 NOTE — Procedures (Signed)
Tolerating hemodialysis.  Not pulling volume due to recent post dialysis malaise.  No current hemodynamic instability. [name] [name] C

## 2015-11-06 ENCOUNTER — Inpatient Hospital Stay (HOSPITAL_COMMUNITY): Payer: Medicare Other

## 2015-11-06 DIAGNOSIS — G9340 Encephalopathy, unspecified: Secondary | ICD-10-CM | POA: Insufficient documentation

## 2015-11-06 LAB — COMPREHENSIVE METABOLIC PANEL
ALK PHOS: 292 U/L — AB (ref 38–126)
ALT: 91 U/L — ABNORMAL HIGH (ref 17–63)
ANION GAP: 12 (ref 5–15)
AST: 563 U/L — ABNORMAL HIGH (ref 15–41)
Albumin: 1.8 g/dL — ABNORMAL LOW (ref 3.5–5.0)
BILIRUBIN TOTAL: 0.7 mg/dL (ref 0.3–1.2)
BUN: 24 mg/dL — ABNORMAL HIGH (ref 6–20)
CALCIUM: 9 mg/dL (ref 8.9–10.3)
CO2: 24 mmol/L (ref 22–32)
CREATININE: 4.43 mg/dL — AB (ref 0.61–1.24)
Chloride: 100 mmol/L — ABNORMAL LOW (ref 101–111)
GFR, EST AFRICAN AMERICAN: 14 mL/min — AB (ref >60)
GFR, EST NON AFRICAN AMERICAN: 12 mL/min — AB (ref >60)
Glucose, Bld: 165 mg/dL — ABNORMAL HIGH (ref 65–99)
Potassium: 4.5 mmol/L (ref 3.5–5.1)
SODIUM: 136 mmol/L (ref 135–145)
TOTAL PROTEIN: 6.9 g/dL (ref 6.5–8.1)

## 2015-11-06 LAB — CBC WITH DIFFERENTIAL/PLATELET
BASOS PCT: 1 %
Basophils Absolute: 0.1 10*3/uL (ref 0.0–0.1)
EOS ABS: 0.3 10*3/uL (ref 0.0–0.7)
EOS PCT: 5 %
HEMATOCRIT: 36.1 % — AB (ref 39.0–52.0)
HEMOGLOBIN: 11.2 g/dL — AB (ref 13.0–17.0)
LYMPHS PCT: 19 %
Lymphs Abs: 1 10*3/uL (ref 0.7–4.0)
MCH: 29.2 pg (ref 26.0–34.0)
MCHC: 31 g/dL (ref 30.0–36.0)
MCV: 94 fL (ref 78.0–100.0)
Monocytes Absolute: 0.7 10*3/uL (ref 0.1–1.0)
Monocytes Relative: 13 %
NEUTROS ABS: 2.9 10*3/uL (ref 1.7–7.7)
Neutrophils Relative %: 62 %
Platelets: 82 10*3/uL — ABNORMAL LOW (ref 150–400)
RBC: 3.84 MIL/uL — ABNORMAL LOW (ref 4.22–5.81)
RDW: 18.4 % — ABNORMAL HIGH (ref 11.5–15.5)
WBC: 5 10*3/uL (ref 4.0–10.5)

## 2015-11-06 LAB — PROTIME-INR
INR: 1.9 — ABNORMAL HIGH (ref 0.00–1.49)
PROTHROMBIN TIME: 21.7 s — AB (ref 11.6–15.2)

## 2015-11-06 LAB — AMMONIA: AMMONIA: 41 umol/L — AB (ref 9–35)

## 2015-11-06 LAB — GLUCOSE, CAPILLARY
GLUCOSE-CAPILLARY: 159 mg/dL — AB (ref 65–99)
GLUCOSE-CAPILLARY: 126 mg/dL — AB (ref 65–99)
Glucose-Capillary: 151 mg/dL — ABNORMAL HIGH (ref 65–99)
Glucose-Capillary: 75 mg/dL (ref 65–99)

## 2015-11-06 MED ORDER — LACTULOSE 10 GM/15ML PO SOLN
20.0000 g | Freq: Three times a day (TID) | ORAL | Status: DC
  Administered 2015-11-06 – 2015-11-10 (×14): 20 g via ORAL
  Filled 2015-11-06 (×12): qty 30

## 2015-11-06 MED ORDER — LORAZEPAM 2 MG/ML IJ SOLN
1.0000 mg | Freq: Once | INTRAMUSCULAR | Status: AC
  Administered 2015-11-06: 1 mg via INTRAVENOUS
  Filled 2015-11-06: qty 1

## 2015-11-06 MED ORDER — VITAMIN K1 10 MG/ML IJ SOLN
10.0000 mg | Freq: Once | INTRAVENOUS | Status: AC
  Administered 2015-11-06: 10 mg via INTRAVENOUS
  Filled 2015-11-06: qty 1

## 2015-11-06 NOTE — Progress Notes (Signed)
Patient discussed with Dr. Arbutus Leas. No weekend d/c is anticipated for patient.  SNF placement continues to be d/c plan for patient; bed offers in place.  Will need to confirm bed choice with son/patient once there is an idea re: date of stability.  Lorri Frederick. Jaci Lazier, [place] [phone]

## 2015-11-06 NOTE — Progress Notes (Signed)
Physical Therapy Treatment Patient Details Name: [name] [name] MRN: [id number] DOB: 04/28/1936 Today's Date: 11/06/2015    History of Present Illness 80 y.o. M Hx CAD, HTN, HLD, CHF, ESRD dialized M/W/F, hypothyroidism, DM2, and a [facility] admission w/ discharge 1/9 on fortaz and vanc with dialysis (last dose 1/13) who was brought in by family with confusion, and decreased appetitie for a couple of days. In the ED he was febrile to 102.6.    PT Comments    [name] [name] more alert and oriented to self today.  However, pt fatigues very quickly and session limited to therapeutic exercises sitting EOB.  He requires max +2 assist for bed mobility.    Follow Up Recommendations  SNF     Equipment Recommendations  None recommended by PT    Recommendations for Other Services       Precautions / Restrictions Precautions Precautions: Fall Restrictions Weight Bearing Restrictions: No    Mobility  Bed Mobility Overal bed mobility: Needs Assistance;+2 for physical assistance Bed Mobility: Supine to Sit;Sit to Supine     Supine to sit: Max assist;+2 for physical assistance;HOB elevated Sit to supine: Max assist;+2 for physical assistance   General bed mobility comments: Assist provided to trunk and Bil LEs w/ use of bed pad for positioning for supine<>sit.  Cues for technique and for hand placement to assist.  Transfers                 General transfer comment: Deferred as pt was awaiting MRI and for pt/therapist safety (strength grossly 2-3/5 Bil LEs)  Ambulation/Gait                 Stairs            Wheelchair Mobility    Modified Rankin (Stroke Patients Only)       Balance Overall balance assessment: Needs assistance Sitting-balance support: Bilateral upper extremity supported;Feet supported Sitting balance-Leahy Scale: Fair Sitting balance - Comments: Pt sat EOB x10 mins to perform therapeutic exercises w/ min guard assist before pt fatiguing and  beginning to close his eyes                            Cognition Arousal/Alertness: Lethargic Behavior During Therapy: Flat affect Overall Cognitive Status: Impaired/Different from baseline Area of Impairment: Orientation;Attention;Memory;Following commands;Awareness Orientation Level: Disoriented to;Place;Situation;Time Current Attention Level: Sustained Memory: Decreased short-term memory Following Commands: Follows one step commands inconsistently;Follows one step commands with increased time   Awareness: Intellectual   General Comments: Multimodal cues for simple commands.      Exercises General Exercises - Upper Extremity Shoulder Flexion: AROM;Both;5 reps;Seated General Exercises - Lower Extremity Ankle Circles/Pumps: PROM;Both;Seated;AROM;20 reps;Supine Long Arc Quad: PROM;Seated;AROM;Left;Right;15 reps    General Comments        Pertinent Vitals/Pain Pain Assessment: Faces Faces Pain Scale: Hurts even more Pain Location: Rt LE>Lt LE Pain Descriptors / Indicators: Grimacing;Guarding;Aching Pain Intervention(s): Limited activity within patient's tolerance;Monitored during session;Repositioned    Home Living                      Prior Function            PT Goals (current goals can now be found in the care plan section) Acute Rehab PT Goals Patient Stated Goal: Pt did not state PT Goal Formulation: Patient unable to participate in goal setting Time For Goal Achievement: 11/15/15 Potential to Achieve Goals: Fair Progress towards  PT goals: Progressing toward goals    Frequency  Min 2X/week    PT Plan Current plan remains appropriate    Co-evaluation             End of Session   Activity Tolerance: Patient limited by fatigue Patient left: with call bell/phone within reach;with bed alarm set;in bed;with family/visitor present;with nursing/sitter in room     Time: 1430-1451 PT Time Calculation (min) (ACUTE ONLY): 21  min  Charges:  $Therapeutic Exercise: 8-22 mins                    G Codes:      Michail Jewels PT, [place] [phone] Pager: [phone] 11/06/2015, 3:45 PM

## 2015-11-06 NOTE — Progress Notes (Signed)
[facility] Progress Note  Assessment/Plan: 1. FUO-  No clear source to date. Per Primary. T MAX 99.3. No antibiotics at present.All  Cultures negative. INR has normalized-should be able to go for arthrocentesis L knee soon.  2. ESRD - MWF -[facility]. Had HD 11/04/2014.  3. Anemia - HGB 11.2. On ESA. Follow HGB.  4. Secondary hyperparathyroidism - P 4 - on one phoslo ac; resumed Hectorol 1 ac 5. HTN/volume - BP meds on hold; BP low. Had HD 11/05/15 Net UF 600. No post wt. Next treatment 01/23 6. Nutrition - renal diet/vitamin/nepro alb 1.8 7. ^ LFTS - trending up. Diflucan stopped. CT of abdomen revealed Cholelithiasis without evidence acute cholecystitis, no acute processes in peritoneal space.  8. DM - off meds - per primary 9. Dementia - worse with infection; actually clearer today;  10. Chronic afib - On coumadin As outpt; ^ upon admission - still high at 3.92 off warfarin 11. Hypothyroidism -TSH 3.1 1/6 12. Thrombocytopenia- follow - seems to be trending up  [employer]. Brown NP-C 11/06/2015, 11:01 AM  [facility] [phone]  Pt seen, examined and agree w A/P as above.  [name] MD [facility] pager [phone]    cell [phone] 11/06/2015, 12:34 PM    Subjective:  "I feel OK".   Objective Filed Vitals:   11/05/15 1651 11/05/15 2102 11/06/15 0533 11/06/15 0747  BP: 112/57 109/69 97/74 101/69  Pulse: 89 96 98 94  Temp: 98.2 F (36.8 C) 99.3 F (37.4 C) 97.6 F (36.4 C) 97.8 F (36.6 C)  TempSrc: Oral Oral  Oral  Resp: Height:      Weight:      SpO2: 96% 99% 98% 98%   Physical Exam General: chronically ill appearing male, NAD Heart: S1, S2. Irreg.  Lungs: bilateral breath sounds CTA Abdomen: soft nontender active bs  Extremities: no edema Dialysis Access: Maturing LUA AVF, R tunneled perm cath. Drsg CDI.   Dialysis Orders: [facility] MonWedFri, 4 hrs 0 min, 180NRe Optiflux, BFR 400, DFR Manual 800 mL/min,  EDW 72.5 (kg), Dialysate 2.0 K, 2.25 Ca, UFR Profile: None, Sodium Model: None, Access: Catheter-Tunneled Heparin: 1500 Unit q treatment Hectoral: 1 mcg IV q treatment  Additional Objective Labs: Basic Metabolic Panel:  Recent Labs Lab 10/31/15 1108  11/03/15 0900 11/04/15 0554 11/05/15 0526 11/06/15 0655  NA 137  < >  --  135 133* 136  K 4.6  < >  --  4.8 5.0 4.5  CL 99*  < >  --  100* 97* 100*  CO2 22  < >  --  24 21* 24  GLUCOSE 73  < >  --  101* 128* 165*  BUN 48*  < >  --  33* 52* 24*  CREATININE 6.78*  < >  --  5.32* 6.93* 4.43*  CALCIUM 8.8*  < >  --  8.9 9.1 9.0  PHOS 5.8*  --  4.0  --   --   --   < > = values in this interval not displayed. Liver Function Tests:  Recent Labs Lab 11/04/15 0554 11/05/15 0526 11/06/15 0655  AST 484* 715* 563*  ALT 66* 94* 91*  ALKPHOS 192* 247* 292*  BILITOT 0.9 0.8 0.7  PROT 7.1 6.8 6.9  ALBUMIN 1.9* 1.9* 1.8*   No results for input(s): LIPASE, AMYLASE in the last 168 hours. CBC:  Recent Labs Lab 11/03/15 0122 11/04/15 0865 11/05/15 0526 11/05/15 1924 11/06/15 7846  WBC 6.1 6.8 6.3 4.6 5.0  NEUTROABS  --  3.8 3.9  --  2.9  HGB 10.2* 10.9* 10.9* 10.8* 11.2*  HCT 33.6* 34.7* 33.9* 35.3* 36.1*  MCV 94.1 94.3 91.6 93.4 94.0  PLT 123* 131* 104* 125* 82*   Blood Culture    Component Value Date/Time   SDES URINE, CATHETERIZED 10/31/2015 0202   SPECREQUEST NONE 10/31/2015 0202   CULT >=100,000 COLONIES/mL YEAST 10/31/2015 0202   REPTSTATUS 11/01/2015 FINAL 10/31/2015 0202    Cardiac Enzymes:  Recent Labs Lab 10/31/15 0834 10/31/15 1410 10/31/15 2010 11/01/15 0212 11/01/15 1226  TROPONINI 0.58* 0.56* 0.39* 0.36* 0.25*   CBG:  Recent Labs Lab 11/04/15 2106 11/05/15 1252 11/05/15 1617 11/05/15 2016 11/06/15 0737  GLUCAP 137* 78 111* 139* 159*   Iron Studies: No results for input(s): IRON, TIBC, TRANSFERRIN, FERRITIN in the last 72 hours. @ Studies/Results: Nm Hepato W/eject  Fract  11/05/2015  CLINICAL DATA:  Gallbladder wall thickening. Multiple stones. Elevated LFTs. EXAM: NUCLEAR MEDICINE HEPATOBILIARY IMAGING TECHNIQUE: Sequential images of the abdomen were obtained out to 60 minutes following intravenous administration of radiopharmaceutical. RADIOPHARMACEUTICALS:  5.2 mCi Tc-59m  Choletec IV COMPARISON:  11/04/2015 ultrasound FINDINGS: There is prompt uptake and excretion of radiotracer by the liver. Activity is seen in the gallbladder by 30 minutes and small bowel by 55 minutes. IMPRESSION: No evidence of cystic duct or common bile duct obstruction. Electronically Signed   By: Charlett Nose M.D.   On: 11/05/2015 15:29   US Abdomen Limited Ruq  11/04/2015  CLINICAL DATA:  Elevated liver function tests. EXAM: US ABDOMEN LIMITED - RIGHT UPPER QUADRANT COMPARISON:  Chest, abdomen and pelvis CT obtained earlier today. FINDINGS: Gallbladder: Diffuse gallbladder wall thickening measuring 7 mm in thickness. Multiple small echogenic foci adherent to the walls of the gallbladder without shadowing. Small amount of corresponding calcific density on the recent CT. Mild pericholecystic fluid. No tenderness over the gallbladder today. Common bile duct: Diameter: 2.0 mm Liver: Dilated hepatic veins. To and fro flow within the portal vein. Minimal ascites. IMPRESSION: 1. Diffuse gallbladder wall thickening. This could be due to right heart failure or chronic cholecystitis. 2. Multiple tiny, adherent stones in the gallbladder. 3. Dilated hepatic veins and to and fro flow in the portal vein, compatible with hepatic congestion due to right heart failure. 4. Minimal ascites and mild pericholecystic fluid. Electronically Signed   By: Beckie Salts M.D.   On: 11/04/2015 16:30   Medications:   . calcium acetate  667 mg Oral TID WC  . carvedilol  6.25 mg Oral BID WC  . vitamin B-12  500 mcg Oral Daily  . darbepoetin (ARANESP) injection - DIALYSIS  40 mcg Intravenous Q Fri-HD  . doxercalciferol   1 mcg Intravenous Q M,W,F-HD  . feeding supplement (NEPRO CARB STEADY)  237 mL Oral BID BM  . insulin aspart  0-9 Units Subcutaneous TID WC  . lactulose  20 g Oral TID  . levothyroxine  175 mcg Oral QAC breakfast  . multivitamin  1 tablet Oral Daily  . phytonadione (VITAMIN K) IV  10 mg Intravenous Once  . sodium chloride  3 mL Intravenous Q12H

## 2015-11-06 NOTE — Progress Notes (Signed)
PROGRESS NOTE  [name] [name]:[id number] DOB: [birth year] DOA: 10/30/2015 PCP: [name], MD Admit HPI / Brief Narrative: 80 y.o. M Hx CAD, HTN, HLD, CHF, ESRD dialized M/W/F, hypothyroidism, DM2, and a HCAP admission w/ discharge 1/9 on fortaz and vanc with dialysis (last dose 1/13) who was brought in by family with confusion, and decreased appetitie for a couple of days. In the ED he was febrile to 102.6. He was given zosyn, vanc, and IVF initially.  Abx changed to vanc and Merrem.  His mental status continued to fluctuate.  The patient continued to have intermittent fevers. All his cultures were negative. His antibiotics were subsequent stopped and the patient was monitored clinically. Assessment/Plan: Sepsis of unclear etiology/FUO -Has a hemodialysis catheter which could potentially be the source, continue IV vancomycin, meropenem, fluconazole  urine culture with greater than 100,000 colonies of yeast-->unclear clinical significance -Follow-up blood cultures--neg to date -CT abd/pelvis--right splenic cyst, no abscess, no colonic inflammatory changes, cholelithiasis without cholecystitis -CT chest--right hilar and right paratracheal lymph node; loculated pleural effusion right oblique fissure without consolidation -MRI left knee irregular meniscal tear of the posterior horn with moderately complex joint effusion -RUQ ultrasound--diffuse gallbladder wall thickening--Rside CHF vs chronic cholecystitis -HIDA scan--neg -d/c vanco and merrem and monitor clinically -waiting for INR <1.5 for IR to perform arthrocentesis  metabolic encephalopathy in the setting of above/?underlying cognitive impairment -due to fever ?infectious process -ammonia--48-->41 -started lactulose -MR brain--pending  Left Knee pain -check uric acid--5.9 -MR left knee--?significant effusion to tap--hesitate to tap blindly in pt on warfarin -discussed with IR, Dr. Konrad [name] to attempt  arthrocentesis if warfarin is reversed -1/19--Vitamin K 10 mg IV 1--> recheck INR 11/05/2015 -11/05/15--repeat vitamin K 10mg  IV x 1--recheck INR on 11/06/15 11/06/15--repeat vitamin K (3rd dose)  ESRD on HD M/W/F HD fistula placed 09/03/15 in L UE w/ good thrill to palpation -HD catheter and right chest, follow-up blood cultures, HD per renal -appreciate nephrology Warfarin induced coagulopathy Dosing per Rx - follow  -Reverse warfarin as discussed above  AST elevation  -Likely due to hepatic congestion from chronic right heart failure -Discontinue fluconazole and Crestor -RUQ ultrasound with GB thickening -HIDA--neg for cystic duct or CBD obstruction  Chronic Afib w/ acute RVR  CHADS-VASc 8 - on chronic warfarin - rate controlled  -10/24/15--Echo EF 40-45%, severe TR -Restart carvedilol-->increase dose to 6.25 mg twice a day -rate controlled -start heparin as INR now <2  Chronic systolic CHF  Volume management per HD  -10/24/15--Echo EF 40-45%, severe TR -Restart carvedilol lower dose  Elevated troponin - CAD s/p PCI RCA  Trop peaked at 0.56 - most c/w demand ischemia/rate related - no chest pain   DM2 -Hemoglobin A1c--5.9 -Patient normally takes glipizide at home -NovoLog sliding scale  HTN BP currently well controlled    Hypothyroidism s/p thyroidectomy  Cont home synthroid dose   Anemia of CKD -B12 marginally low -supplement  Stage 1 sacral wound -not infected on exam -consult wound care -present at time of admission  Code Status: FULL Family Communication: updated nephew at bedside 11/05/15 Disposition Plan: Home versus SNF, pending workup  Consultants: Nephrology   Procedures: 1/8 TTE - EF 40-45% - hypokinesis anteroseptal myocardium - mild mitral valve regurg - LA mildly dilated - RA mod dilated - severe TR - PA pressure 39mm Hg  Antibiotics: Zosyn (prior to admit)  Vanc (prior to admit) >1/18 Meropenem 1/15 >1/21   Family  Communication:  Sister updated at beside 1/21 Disposition Plan:   SNF when medically stable     Procedures/Studies: Dg Chest 2 View  10/24/2015  CLINICAL DATA:  Nonproductive cough, hypoxemia. EXAM: CHEST  2 VIEW COMPARISON:  Chest x-rays dated 10/20/2015 and 09/04/2015. FINDINGS: Fairly severe cardiomegaly is unchanged. Right chest wall dual lumen catheter is stable in position with tip projected over the right atrium. Central pulmonary vascular congestion and mild bilateral interstitial edema is unchanged, suspected to be chronic. New confluent airspace opacity is seen at the right lung base. IMPRESSION: 1. New confluent airspace opacity at the right lung base, pneumonia versus aspiration. 2. Stable cardiomegaly. The mild central pulmonary vascular congestion and bilateral interstitial edema is not significantly changed, most likely related to a chronic mild CHF. Electronically Signed   By: Bary Richard M.D.   On: 10/24/2015 11:10   Dg Chest 2 View  10/20/2015  CLINICAL DATA:  Acute onset of altered mental status. Tachycardia. Initial encounter. EXAM: CHEST  2 VIEW COMPARISON:  Chest radiograph performed 09/04/2015 FINDINGS: The lungs are well-aerated. Vascular congestion is noted. A small left pleural effusion is suspected. Increased interstitial markings likely reflect pulmonary edema. There is no evidence of pneumothorax. The heart is enlarged. No acute osseous abnormalities are seen. A right-sided dual-lumen catheter is noted ending about the cavoatrial junction. IMPRESSION: Vascular congestion and cardiomegaly. Small left pleural effusion suspected. Increased interstitial markings likely reflect pulmonary edema. Electronically Signed   By: [name] M.D.   On: 10/20/2015 22:00   Ct Head Wo Contrast  10/20/2015  CLINICAL DATA:  Altered mental status EXAM: CT HEAD WITHOUT CONTRAST TECHNIQUE: Contiguous axial images were obtained from the base of the skull through the vertex without intravenous  contrast. COMPARISON:  Head CT July 19, 2015; brain MRI July 20, 2015 FINDINGS: There is stable age related volume loss. There is no intracranial mass, hemorrhage, extra-axial fluid collection, or midline shift. There is stable slight small vessel disease in the centra semiovale bilaterally. Elsewhere gray-white compartments are normal. No acute infarct is evident. Calcification along the falx is stable. Bony calvarium appears intact. The mastoid air cells are clear. No intraorbital lesions are identified. IMPRESSION: Slight periventricular small vessel disease, stable. No intracranial mass, hemorrhage, or new gray - white compartment lesions/acute appearing infarct. Stable appearance compared to prior studies. Electronically Signed   By: Bretta Bang III M.D.   On: 10/20/2015 21:26   Ct Chest W Contrast  11/04/2015  CLINICAL DATA:  Fever of unknown origin. History of coronary disease hypertension, congestive heart failure chronic renal disease. EXAM: CT CHEST, ABDOMEN, AND PELVIS WITH CONTRAST TECHNIQUE: Multidetector CT imaging of the chest, abdomen and pelvis was performed following the standard protocol during bolus administration of intravenous contrast. CONTRAST:  OMNIPAQUE IOHEXOL 300 MG/ML  SOLN COMPARISON:  Chest radiograph 10/24/2015, 10/20/2015 on CT thorax 03/12/2004 FINDINGS: CT CHEST FINDINGS Mediastinum/Nodes: No axillary or supraclavicular adenopathy. The paratracheal adenopathy is present. Example 16 mm short axis RIGHT lower paratracheal lymph node which is increased from 12 mm on prior. RIGHT hilar lymph node measures 20 mm on image 25 series 201 increased from 18 mm. AP window node measures 9 mm compared to 11 mm. No central pulmonary embolism. Borderline enlarged LEFT supraclavicular lymph node measures 11 mm (image 5, series 201) Cardiomegaly noted. Lungs/Pleura: There is interlobular septal thickening in the lungs. Extensive centrilobular emphysema. There is loculated fluid  collections along the RIGHT oblique fissure. No consolidation to suggest active pulmonary infection. Musculoskeletal: No  aggressive osseous lesion. CT ABDOMEN AND PELVIS FINDINGS Hepatobiliary: There is reflux from the injection bolus into the hepatic veins consistent with RIGHT heart insufficiency. No focal hepatic lesion. Several gallstones collect within the decompressed gallbladder. No biliary duct dilatation. Pancreas: Pancreas is normal. No ductal dilatation. No pancreatic inflammation. Spleen: Low-attenuation lesion within the spleen is rounded measuring 3.8 cm. This is new from 2005. Adrenals/urinary tract: Adrenal glands are normal. Poor renal cortical enhancement. High-density lesion measuring 8 mm in the RIGHT renal cortex likely represents hemorrhagic cyst. Ureters and bladder normal. The bladder is collapsed Stomach/Bowel: Stomach, small bowel and cecum are normal. The appendix is not identified. Small amount fluid inferior to the cecal tip (image 99 of the axial series). Colon rectosigmoid colon are normal. No evidence of colonic inflammation or infection Vascular/Lymphatic: Abdominal aorta is normal caliber with atherosclerotic calcification. There is no retroperitoneal or periportal lymphadenopathy. No pelvic lymphadenopathy. Reproductive: Prostate normal Other: No abscess in the abdomen pelvis. Musculoskeletal: No aggressive osseous lesion. Gas in the L4-L5 disc space likely relates to degenerative change. There is significant arthropathy at the facet joints at this level. IMPRESSION: Chest Impression: 1. Progressive mediastinal and hilar lymphadenopathy from CT 2005. This may relate to congestive heart failure. Cannot exclude lymphoproliferative process such as CLL. No significant adenopathy in the abdomen or axilla would favor against lymphoma. 2. Cardiomegaly and loculated pleural effusions with interlobular septal thickening likely relates to congestive heart failure. RIGHT heart insufficiency  noted with contrast reflux into hepatic veins. 3. Centrilobular emphysema. 4. No evidence of acute pulmonary infection. Abdomen / Pelvis Impression: 1. Large round lesion within the spleen is new from 2005. In patient with fever of unknown origin cannot exclude a splenic abscess. No evidence of enhancement with favor benign cyst. Consider MRI of the abdomen with contrast. Of note, if patient cannot hold breath then MRI may not be a suitable imaging modality. In that case, consider FDG PET scan for evaluation if clinical suspicion warrants. 2. No evidence of abscess or inflammation the peritoneal space. 3. Small amount of free fluid along the RIGHT pericolic gutter likely relates to heart failure described above. 4. Cholelithiasis without evidence acute cholecystitis. Electronically Signed   By: Genevive Bi M.D.   On: 11/04/2015 08:29   Ct Abdomen Pelvis W Contrast  11/04/2015  CLINICAL DATA:  Fever of unknown origin. History of coronary disease hypertension, congestive heart failure chronic renal disease. EXAM: CT CHEST, ABDOMEN, AND PELVIS WITH CONTRAST TECHNIQUE: Multidetector CT imaging of the chest, abdomen and pelvis was performed following the standard protocol during bolus administration of intravenous contrast. CONTRAST:  OMNIPAQUE IOHEXOL 300 MG/ML  SOLN COMPARISON:  Chest radiograph 10/24/2015, 10/20/2015 on CT thorax 03/12/2004 FINDINGS: CT CHEST FINDINGS Mediastinum/Nodes: No axillary or supraclavicular adenopathy. The paratracheal adenopathy is present. Example 16 mm short axis RIGHT lower paratracheal lymph node which is increased from 12 mm on prior. RIGHT hilar lymph node measures 20 mm on image 25 series 201 increased from 18 mm. AP window node measures 9 mm compared to 11 mm. No central pulmonary embolism. Borderline enlarged LEFT supraclavicular lymph node measures 11 mm (image 5, series 201) Cardiomegaly noted. Lungs/Pleura: There is interlobular septal thickening in the lungs.  Extensive centrilobular emphysema. There is loculated fluid collections along the RIGHT oblique fissure. No consolidation to suggest active pulmonary infection. Musculoskeletal: No aggressive osseous lesion. CT ABDOMEN AND PELVIS FINDINGS Hepatobiliary: There is reflux from the injection bolus into the hepatic veins consistent with RIGHT heart insufficiency. No  focal hepatic lesion. Several gallstones collect within the decompressed gallbladder. No biliary duct dilatation. Pancreas: Pancreas is normal. No ductal dilatation. No pancreatic inflammation. Spleen: Low-attenuation lesion within the spleen is rounded measuring 3.8 cm. This is new from 2005. Adrenals/urinary tract: Adrenal glands are normal. Poor renal cortical enhancement. High-density lesion measuring 8 mm in the RIGHT renal cortex likely represents hemorrhagic cyst. Ureters and bladder normal. The bladder is collapsed Stomach/Bowel: Stomach, small bowel and cecum are normal. The appendix is not identified. Small amount fluid inferior to the cecal tip (image 99 of the axial series). Colon rectosigmoid colon are normal. No evidence of colonic inflammation or infection Vascular/Lymphatic: Abdominal aorta is normal caliber with atherosclerotic calcification. There is no retroperitoneal or periportal lymphadenopathy. No pelvic lymphadenopathy. Reproductive: Prostate normal Other: No abscess in the abdomen pelvis. Musculoskeletal: No aggressive osseous lesion. Gas in the L4-L5 disc space likely relates to degenerative change. There is significant arthropathy at the facet joints at this level. IMPRESSION: Chest Impression: 1. Progressive mediastinal and hilar lymphadenopathy from CT 2005. This may relate to congestive heart failure. Cannot exclude lymphoproliferative process such as CLL. No significant adenopathy in the abdomen or axilla would favor against lymphoma. 2. Cardiomegaly and loculated pleural effusions with interlobular septal thickening likely  relates to congestive heart failure. RIGHT heart insufficiency noted with contrast reflux into hepatic veins. 3. Centrilobular emphysema. 4. No evidence of acute pulmonary infection. Abdomen / Pelvis Impression: 1. Large round lesion within the spleen is new from 2005. In patient with fever of unknown origin cannot exclude a splenic abscess. No evidence of enhancement with favor benign cyst. Consider MRI of the abdomen with contrast. Of note, if patient cannot hold breath then MRI may not be a suitable imaging modality. In that case, consider FDG PET scan for evaluation if clinical suspicion warrants. 2. No evidence of abscess or inflammation the peritoneal space. 3. Small amount of free fluid along the RIGHT pericolic gutter likely relates to heart failure described above. 4. Cholelithiasis without evidence acute cholecystitis. Electronically Signed   By: Genevive Bi M.D.   On: 11/04/2015 08:29   Mr Knee Left  Wo Contrast  11/03/2015  CLINICAL DATA:  80 year old in patient on dialysis with left-greater-than-right knee pain. Fever of unknown origin. EXAM: MRI OF THE LEFT KNEE WITHOUT CONTRAST TECHNIQUE: Multiplanar, multisequence MR imaging of the knee was performed. No intravenous contrast was administered. COMPARISON:  None. FINDINGS: Study is mildly motion degraded. MENISCI Medial meniscus: Meniscal degeneration with irregular radial tear in the posterior horn. The meniscal root is intact. No centrally displaced meniscal fragment. Lateral meniscus:  Meniscal degeneration without discrete tear. LIGAMENTS Cruciates:  Intact with mild mucoid degeneration. Collaterals: Intact. There is a small amount of medial bursal fluid. The popliteus tendon is thickened near its femoral insertion. CARTILAGE Patellofemoral:  Adequately preserved. Medial: Mild chondral thinning and surface irregularity. There is mild subchondral edema and intraosseous ganglion formation throughout the medial tibial plateau. Lateral:   Adequately preserved. OTHER Joint: Moderate size complex joint effusion consistent with synovitis. No discrete intra-articular loose bodies observed. Popliteal Fossa:  Small mildly complex Baker's cyst. Extensor Mechanism: The distal quadriceps tendon appears normal. There is patellar tendinosis with a long segment of tubular T2 hyperintensity in the medial aspect of the tendon Bones: There are several intraosseous ganglia in the proximal tibia, largest posteriorly in the medial tibial plateau, measuring up to 1.6 cm. There is no evidence of periarticular bone marrow edema or osteomyelitis. IMPRESSION: 1. Degenerative radial tear of  the posterior horn of the medial meniscus. 2. Lateral meniscal degeneration without evidence of tear. 3. Complex joint effusion consistent with synovitis. No specific evidence of infection or osteomyelitis. Correlate clinically. 4. Intra tendinous cyst formation medially in the patellar tendon, likely a ganglion cyst. This could indicate underlying interstitial tendon tearing. Electronically Signed   By: Carey Bullocks M.D.   On: 11/03/2015 19:59   Nm Hepato W/eject Fract  11/05/2015  CLINICAL DATA:  Gallbladder wall thickening. Multiple stones. Elevated LFTs. EXAM: NUCLEAR MEDICINE HEPATOBILIARY IMAGING TECHNIQUE: Sequential images of the abdomen were obtained out to 60 minutes following intravenous administration of radiopharmaceutical. RADIOPHARMACEUTICALS:  5.2 mCi Tc-27m  Choletec IV COMPARISON:  11/04/2015 ultrasound FINDINGS: There is prompt uptake and excretion of radiotracer by the liver. Activity is seen in the gallbladder by 30 minutes and small bowel by 55 minutes. IMPRESSION: No evidence of cystic duct or common bile duct obstruction. Electronically Signed   By: Charlett Nose M.D.   On: 11/05/2015 15:29   Dg Chest Port 1 View  10/30/2015  CLINICAL DATA:  Acute onset of shortness of breath. Initial encounter. EXAM: PORTABLE CHEST 1 VIEW COMPARISON:  Chest radiograph  performed 10/24/2015 FINDINGS: The patient's right-sided dual-lumen catheter is noted ending overlying the right atrium. The lungs are well-aerated. Vascular congestion is noted. Increased interstitial markings raise concern for mild interstitial edema. A small left pleural effusion may be present. No pneumothorax is seen. The cardiomediastinal silhouette is mildly enlarged. No acute osseous abnormalities are seen. IMPRESSION: Vascular congestion and mild cardiomegaly. Increased interstitial markings raise concern for mild interstitial edema. Small left pleural effusion may be present. Electronically Signed   By: [name] M.D.   On: 10/30/2015 23:54   US Abdomen Limited Ruq  11/04/2015  CLINICAL DATA:  Elevated liver function tests. EXAM: US ABDOMEN LIMITED - RIGHT UPPER QUADRANT COMPARISON:  Chest, abdomen and pelvis CT obtained earlier today. FINDINGS: Gallbladder: Diffuse gallbladder wall thickening measuring 7 mm in thickness. Multiple small echogenic foci adherent to the walls of the gallbladder without shadowing. Small amount of corresponding calcific density on the recent CT. Mild pericholecystic fluid. No tenderness over the gallbladder today. Common bile duct: Diameter: 2.0 mm Liver: Dilated hepatic veins. To and fro flow within the portal vein. Minimal ascites. IMPRESSION: 1. Diffuse gallbladder wall thickening. This could be due to right heart failure or chronic cholecystitis. 2. Multiple tiny, adherent stones in the gallbladder. 3. Dilated hepatic veins and to and fro flow in the portal vein, compatible with hepatic congestion due to right heart failure. 4. Minimal ascites and mild pericholecystic fluid. Electronically Signed   By: Beckie Salts M.D.   On: 11/04/2015 16:30         Subjective: Patient complains of bilateral knee pain and bilateral ankle pain. Denies any fever, chills, chest pain, shortness breath, nausea, vomiting, diarrhea, abdominal pain. Denies a headache or neck  pain.  Objective: Filed Vitals:   11/05/15 2102 11/06/15 0533 11/06/15 0747 11/06/15 1700  BP: 109/69 97/74 101/69 138/72  Pulse: 96 98 94 87  Temp: 99.3 F (37.4 C) 97.6 F (36.4 C) 97.8 F (36.6 C) 98.4 F (36.9 C)  TempSrc: Oral  Oral Oral  Resp: 16 18 18 18   Height:      Weight:      SpO2: 99% 98% 98% 98%    Intake/Output Summary (Last 24 hours) at 11/06/15 1826 Last data filed at 11/06/15 1350  Gross per 24 hour  Intake    480  ml  Output      0 ml  Net    480 ml   Weight change: -2.8 kg (-6 lb 2.8 oz) Exam:   General:  Pt is alert, follows commands appropriately, not in acute distress  HEENT: No icterus, No thrush, No neck mass, [place]/AT  Cardiovascular: RRR, S1/S2, no rubs, no gallops  Respiratory: Bibasilar crackles. No wheeze. Good air movement  Abdomen: Soft/+BS, non tender, non distended, no guarding; no splenomegaly  Extremities: No edema, No lymphangitis, No petechiae, No rashes,  synovitis of left and right knee  Data Reviewed: Basic Metabolic Panel:  Recent Labs Lab 10/31/15 1108 11/01/15 0212 11/02/15 0644 11/03/15 0122 11/03/15 0900 11/04/15 0554 11/05/15 0526 11/06/15 0655  NA 137 135 137 136  --  135 133* 136  K 4.6 3.8 3.9 4.2  --  4.8 5.0 4.5  CL 99* 100* 98* 97*  --  100* 97* 100*  CO2 --  24 21* 24  GLUCOSE 73 271* 82 229*  --  101* 128* 165*  BUN 48* 56* 21* 38*  --  33* 52* 24*  CREATININE 6.78* 7.84* 4.72* 6.14*  --  5.32* 6.93* 4.43*  CALCIUM 8.8* 8.2* 8.1* 8.5*  --  8.9 9.1 9.0  MG  --  2.1  --   --   --   --   --   --   PHOS 5.8*  --   --   --  4.0  --   --   --    Liver Function Tests:  Recent Labs Lab 11/02/15 0644 11/03/15 0122 11/04/15 0554 11/05/15 0526 11/06/15 0655  AST 133* 171* 484* 715* 563*  ALT 25 33 66* 94* 91*  ALKPHOS 132* 164* 192* 247* 292*  BILITOT 0.8 0.6 0.9 0.8 0.7  PROT 6.6 6.4* 7.1 6.8 6.9  ALBUMIN 1.8* 1.7* 1.9* 1.9* 1.8*   No results for input(s): LIPASE, AMYLASE in the  last 168 hours.  Recent Labs Lab 11/04/15 1355 11/06/15 0655  AMMONIA 48* 41*   CBC:  Recent Labs Lab 11/01/15 0212 11/02/15 0644 11/03/15 0122 11/04/15 0554 11/05/15 0526 11/05/15 1924 11/06/15 0655  WBC 6.8 5.4 6.1 6.8 6.3 4.6 5.0  NEUTROABS 4.5 3.0  --  3.8 3.9  --  2.9  HGB 10.2* 10.6* 10.2* 10.9* 10.9* 10.8* 11.2*  HCT 31.9* 34.9* 33.6* 34.7* 33.9* 35.3* 36.1*  MCV 93.3 95.4 94.1 94.3 91.6 93.4 94.0  PLT 119* 122* 123* 131* 104* 125* 82*   Cardiac Enzymes:  Recent Labs Lab 10/31/15 0834 10/31/15 1410 10/31/15 2010 11/01/15 0212 11/01/15 1226  TROPONINI 0.58* 0.56* 0.39* 0.36* 0.25*   BNP: Invalid input(s): POCBNP CBG:  Recent Labs Lab 11/05/15 1617 11/05/15 2016 11/06/15 0737 11/06/15 1129 11/06/15 1627  GLUCAP 111* 139* 159* 126* 151*    Recent Results (from the past 240 hour(s))  Blood Culture (routine x 2)     Status: None   Collection Time: 10/31/15 12:45 AM  Result Value Ref Range Status   Specimen Description BLOOD RIGHT HAND  Final   Special Requests IN PEDIATRIC BOTTLE 3CC  Final   Culture NO GROWTH 5 DAYS  Final   Report Status 11/05/2015 FINAL  Final  Blood Culture (routine x 2)     Status: None   Collection Time: 10/31/15 12:49 AM  Result Value Ref Range Status   Specimen Description BLOOD LEFT HAND  Final   Special Requests IN PEDIATRIC BOTTLE 4CC  Final  Culture NO GROWTH 5 DAYS  Final   Report Status 11/05/2015 FINAL  Final  Urine culture     Status: None   Collection Time: 10/31/15  2:02 AM  Result Value Ref Range Status   Specimen Description URINE, CATHETERIZED  Final   Special Requests NONE  Final   Culture >=100,000 COLONIES/mL YEAST  Final   Report Status 11/01/2015 FINAL  Final     Scheduled Meds: . calcium acetate  667 mg Oral TID WC  . carvedilol  6.25 mg Oral BID WC  . vitamin B-12  500 mcg Oral Daily  . darbepoetin (ARANESP) injection - DIALYSIS  40 mcg Intravenous Q Fri-HD  . doxercalciferol  1 mcg  Intravenous Q M,W,F-HD  . feeding supplement (NEPRO CARB STEADY)  237 mL Oral BID BM  . insulin aspart  0-9 Units Subcutaneous TID WC  . lactulose  20 g Oral TID  . levothyroxine  175 mcg Oral QAC breakfast  . LORazepam  1 mg Intravenous Once  . multivitamin  1 tablet Oral Daily  . sodium chloride  3 mL Intravenous Q12H   Continuous Infusions:    Cicilia Clinger, DO  [facility] Pager [phone]  If 7PM-7AM, please contact night-coverage [email or web address] Password [email or web address] 11/06/2015, 6:26 PM   LOS: 6 days

## 2015-11-06 NOTE — Progress Notes (Signed)
OT Cancellation Note  Patient Details Name: [name] [name] MRN: [id number] DOB: Jan 08, 1936   Cancelled Treatment:    Reason Eval/Treat Not Completed: Fatigue/lethargy limiting ability to participate;Medical issues which prohibited therapy.  Pt. States he was up all night and feels very sick and tired.  States he is also having stomach issues.  Will check back as able.    Robet Leu, COTA/L 11/06/2015, 9:42 AM

## 2015-11-07 LAB — COMPREHENSIVE METABOLIC PANEL
ALBUMIN: 1.9 g/dL — AB (ref 3.5–5.0)
ALK PHOS: 324 U/L — AB (ref 38–126)
ALT: 99 U/L — ABNORMAL HIGH (ref 17–63)
ANION GAP: 16 — AB (ref 5–15)
AST: 625 U/L — ABNORMAL HIGH (ref 15–41)
BUN: 36 mg/dL — ABNORMAL HIGH (ref 6–20)
CALCIUM: 9.3 mg/dL (ref 8.9–10.3)
CHLORIDE: 96 mmol/L — AB (ref 101–111)
CO2: 23 mmol/L (ref 22–32)
Creatinine, Ser: 6.19 mg/dL — ABNORMAL HIGH (ref 0.61–1.24)
GFR calc non Af Amer: 8 mL/min — ABNORMAL LOW (ref >60)
GFR, EST AFRICAN AMERICAN: 9 mL/min — AB (ref >60)
GLUCOSE: 128 mg/dL — AB (ref 65–99)
POTASSIUM: 5.4 mmol/L — AB (ref 3.5–5.1)
SODIUM: 135 mmol/L (ref 135–145)
Total Bilirubin: 1.6 mg/dL — ABNORMAL HIGH (ref 0.3–1.2)
Total Protein: 6.8 g/dL (ref 6.5–8.1)

## 2015-11-07 LAB — GLUCOSE, CAPILLARY
GLUCOSE-CAPILLARY: 119 mg/dL — AB (ref 65–99)
GLUCOSE-CAPILLARY: 140 mg/dL — AB (ref 65–99)
Glucose-Capillary: 162 mg/dL — ABNORMAL HIGH (ref 65–99)
Glucose-Capillary: 140 mg/dL — ABNORMAL HIGH (ref 65–99)

## 2015-11-07 LAB — CBC WITH DIFFERENTIAL/PLATELET
BASOS ABS: 0 10*3/uL (ref 0.0–0.1)
BASOS PCT: 1 %
Eosinophils Absolute: 0.2 10*3/uL (ref 0.0–0.7)
Eosinophils Relative: 5 %
HEMATOCRIT: 36 % — AB (ref 39.0–52.0)
HEMOGLOBIN: 11.2 g/dL — AB (ref 13.0–17.0)
LYMPHS PCT: 23 %
Lymphs Abs: 1.1 10*3/uL (ref 0.7–4.0)
MCH: 29.1 pg (ref 26.0–34.0)
MCHC: 31.1 g/dL (ref 30.0–36.0)
MCV: 93.5 fL (ref 78.0–100.0)
MONOS PCT: 11 %
Monocytes Absolute: 0.5 10*3/uL (ref 0.1–1.0)
NEUTROS ABS: 3 10*3/uL (ref 1.7–7.7)
Neutrophils Relative %: 60 %
Platelets: 94 10*3/uL — ABNORMAL LOW (ref 150–400)
RBC: 3.85 MIL/uL — ABNORMAL LOW (ref 4.22–5.81)
RDW: 18.7 % — ABNORMAL HIGH (ref 11.5–15.5)
WBC: 4.8 10*3/uL (ref 4.0–10.5)

## 2015-11-07 LAB — AMMONIA: Ammonia: 147 umol/L — ABNORMAL HIGH (ref 9–35)

## 2015-11-07 LAB — PROTIME-INR
INR: 1.72 — AB (ref 0.00–1.49)
Prothrombin Time: 20.1 seconds — ABNORMAL HIGH (ref 11.6–15.2)

## 2015-11-07 MED ORDER — VITAMIN K1 10 MG/ML IJ SOLN
10.0000 mg | Freq: Once | INTRAVENOUS | Status: AC
  Administered 2015-11-07: 10 mg via INTRAVENOUS
  Filled 2015-11-07 (×2): qty 1

## 2015-11-07 NOTE — Progress Notes (Signed)
[facility] Progress Note  Assessment: 1. Fevers-  +urine cx grew fungus, is s/p 3 d of diflucan, blood cx's neg , CXR negative. Other abx dc'd as well. HD cath site clear, maturing AVF L arm clean wounds. Temps improved, afebrile x 48 hrs.  2. ESRD - MWF HD 3. Anemia - HGB 11.2. On ESA. Follow HGB.  4. Secondary hyperparathyroidism - P 4 - on one phoslo ac; resumed Hectorol 1 ac 5. HTN/volume - at dry wt, BP's on low side. Home coreg/ dilt on hold. 6. Nutrition - renal diet/vitamin/nepro alb 1.8 7. ^ LFTS - trending up. Diflucan stopped. CT of abdomen revealed Cholelithiasis without evidence acute cholecystitis, no acute processes in peritoneal space.  8. DM - off meds - per primary 9. Dementia - worse with infection, has improved 10. Chronic afib -no coumadin 11. Hypothyroidism -TSH 3.1 1/6 12. Thrombocytopenia- follow - seems to be trending up 13. LVEF 40-45% by echo 1/8, LA/RA dilated, severe PA pressure elevation 39 mmHg  Plan - no new suggestions. Stable for dc from renal standpoint.   [name] MD [facility] pager [phone]    cell [phone] 11/07/2015, 12:28 PM   Subjective:  "I feel OK".   Objective Filed Vitals:   11/06/15 1700 11/06/15 2049 11/07/15 0559 11/07/15 0923  BP: 138/72 106/70 110/79 103/67  Pulse: 87 96 73 80  Temp: 98.4 F (36.9 C) 98 F (36.7 C) 98 F (36.7 C) 98.7 F (37.1 C)  TempSrc: Oral   Oral  Resp: Height:      Weight:  71 kg (156 lb 8.4 oz)    SpO2: 98% 98% 96% 96%   Physical Exam General: chronically ill appearing male, NAD Heart: S1, S2. Irreg.  Lungs: bilateral breath sounds CTA Abdomen: soft nontender active bs  Extremities: no edema Dialysis Access: Maturing LUA AVF, R tunneled perm cath. Drsg CDI.   Dialysis: [facility]   4h   400/800  72.5kg   2/2.25 bath  R IJ cath/ LUA AVF maturing  Hep 1500 Hect 1 ug tiw  Additional Objective Labs: Basic Metabolic Panel:  Recent  Labs Lab 10/31/15 1108  11/03/15 0900 11/04/15 0554 11/05/15 0526 11/06/15 0655  NA 137  < >  --  135 133* 136  K 4.6  < >  --  4.8 5.0 4.5  CL 99*  < >  --  100* 97* 100*  CO2 22  < >  --  24 21* 24  GLUCOSE 73  < >  --  101* 128* 165*  BUN 48*  < >  --  33* 52* 24*  CREATININE 6.78*  < >  --  5.32* 6.93* 4.43*  CALCIUM 8.8*  < >  --  8.9 9.1 9.0  PHOS 5.8*  --  4.0  --   --   --   < > = values in this interval not displayed. Liver Function Tests:  Recent Labs Lab 11/04/15 0554 11/05/15 0526 11/06/15 0655  AST 484* 715* 563*  ALT 66* 94* 91*  ALKPHOS 192* 247* 292*  BILITOT 0.9 0.8 0.7  PROT 7.1 6.8 6.9  ALBUMIN 1.9* 1.9* 1.8*   No results for input(s): LIPASE, AMYLASE in the last 168 hours. CBC:  Recent Labs Lab 11/03/15 0122 11/04/15 0554 11/05/15 0526 11/05/15 1924 11/06/15 0655  WBC 6.1 6.8 6.3 4.6 5.0  NEUTROABS  --  3.8 3.9  --  2.9  HGB 10.2* 10.9* 10.9*  10.8* 11.2*  HCT 33.6* 34.7* 33.9* 35.3* 36.1*  MCV 94.1 94.3 91.6 93.4 94.0  PLT 123* 131* 104* 125* 82*   Blood Culture    Component Value Date/Time   SDES URINE, CATHETERIZED 10/31/2015 0202   SPECREQUEST NONE 10/31/2015 0202   CULT >=100,000 COLONIES/mL YEAST 10/31/2015 0202   REPTSTATUS 11/01/2015 FINAL 10/31/2015 0202    Cardiac Enzymes:  Recent Labs Lab 10/31/15 1410 10/31/15 2010 11/01/15 0212 11/01/15 1226  TROPONINI 0.56* 0.39* 0.36* 0.25*   CBG:  Recent Labs Lab 11/06/15 0737 11/06/15 1129 11/06/15 1627 11/06/15 2228 11/07/15 0722  GLUCAP 159* 126* 151* 75 119*   Iron Studies: No results for input(s): IRON, TIBC, TRANSFERRIN, FERRITIN in the last 72 hours. @ Studies/Results: Mr Brain Wo Contrast  11/07/2015  CLINICAL DATA:  Initial evaluation for acute encephalopathy. EXAM: MRI HEAD WITHOUT CONTRAST TECHNIQUE: Multiplanar, multiecho pulse sequences of the brain and surrounding structures were obtained without intravenous contrast. COMPARISON:  Prior MRI  from 07/20/2015. FINDINGS: Study degraded by motion artifact. Diffuse prominence of the CSF containing spaces compatible with generalized age-related cerebral atrophy. Confluent T2/FLAIR hyperintensity within the periventricular white matter most consistent with mild chronic small vessel ischemic disease. No abnormal foci of restricted diffusion to suggest acute intracranial infarct. Gray-white matter differentiation maintained. Major intracranial vascular flow voids grossly preserved. No acute or chronic intracranial hemorrhage. No mass lesion, midline shift, or mass effect. No hydrocephalus. No extra-axial fluid collection. Craniocervical junction within normal limits. Mild degenerative spondylolysis present within the upper cervical spine without stenosis. Pituitary gland within normal limits. No acute abnormality about the orbits. Mild mucosal thickening within the left maxillary sinus. Paranasal sinuses are otherwise largely clear. Trace opacity within the left mastoid air cells. Right mastoid air cells clear. Inner ear structures within normal limits. Bone marrow signal intensity within normal limits. No scalp soft tissue abnormality. IMPRESSION: 1. No acute intracranial infarct or other process identified. 2. Age-related cerebral atrophy with mild chronic small vessel ischemic disease, stable. Electronically Signed   By: Rise Mu M.D.   On: 11/07/2015 00:13   Nm Hepato W/eject Fract  11/05/2015  CLINICAL DATA:  Gallbladder wall thickening. Multiple stones. Elevated LFTs. EXAM: NUCLEAR MEDICINE HEPATOBILIARY IMAGING TECHNIQUE: Sequential images of the abdomen were obtained out to 60 minutes following intravenous administration of radiopharmaceutical. RADIOPHARMACEUTICALS:  5.2 mCi Tc-83m  Choletec IV COMPARISON:  11/04/2015 ultrasound FINDINGS: There is prompt uptake and excretion of radiotracer by the liver. Activity is seen in the gallbladder by 30 minutes and small bowel by 55 minutes.  IMPRESSION: No evidence of cystic duct or common bile duct obstruction. Electronically Signed   By: Charlett Nose M.D.   On: 11/05/2015 15:29   Medications:   . calcium acetate  667 mg Oral TID WC  . carvedilol  6.25 mg Oral BID WC  . vitamin B-12  500 mcg Oral Daily  . darbepoetin (ARANESP) injection - DIALYSIS  40 mcg Intravenous Q Fri-HD  . doxercalciferol  1 mcg Intravenous Q M,W,F-HD  . feeding supplement (NEPRO CARB STEADY)  237 mL Oral BID BM  . insulin aspart  0-9 Units Subcutaneous TID WC  . lactulose  20 g Oral TID  . levothyroxine  175 mcg Oral QAC breakfast  . multivitamin  1 tablet Oral Daily  . sodium chloride  3 mL Intravenous Q12H

## 2015-11-07 NOTE — Progress Notes (Signed)
PROGRESS NOTE  [name] [name] ZOX:[id number] DOB: May 28, 1936 DOA: 10/30/2015 PCP: [name], MD   Admit HPI / Brief Narrative: 80 y.o. M Hx CAD, HTN, HLD, CHF, ESRD dialized M/W/F, hypothyroidism, DM2, and a HCAP admission w/ discharge 1/9 on fortaz and vanc with dialysis (last dose 1/13) who was brought in by family with confusion, and decreased appetitie for a couple of days. In the ED he was febrile to 102.6. He was given zosyn, vanc, and IVF initially. Abx changed to vanc and Merrem. His mental status continued to fluctuate. The patient continued to have intermittent fevers. All his cultures were negative. His antibiotics were subsequent stopped and the patient was monitored clinically. Assessment/Plan: Sepsis of unclear etiology/FUO -Has a hemodialysis catheter which could potentially be the source, continue IV vancomycin, meropenem, fluconazole  urine culture with greater than 100,000 colonies of yeast-->unclear clinical significance -Follow-up blood cultures--neg to date -CT abd/pelvis--right splenic cyst, no abscess, no colonic inflammatory changes, cholelithiasis without cholecystitis -CT chest--right hilar and right paratracheal lymph node; loculated pleural effusion right oblique fissure without consolidation -MRI left knee irregular meniscal tear of the posterior horn with moderately complex joint effusion -RUQ ultrasound--diffuse gallbladder wall thickening--Rside CHF vs chronic cholecystitis -HIDA scan--neg -d/c vanco and merrem and monitor clinically -waiting for INR <1.5 for IR to perform arthrocentesis  metabolic encephalopathy in the setting of above/?underlying cognitive impairment/Hyerammonemia -due to fever ?infectious process -ammonia--48-->41 -11/07/15 ammonia 147??accuracy-->recheck in am -continue lactulose -MR brain--negative for acute stroke or other acute findings  Left Knee pain -check uric acid--5.9 -MR left knee--?significant effusion  to tap--hesitate to tap blindly in pt on warfarin -discussed with IR, Dr. Konrad [name] to attempt arthrocentesis if warfarin is reversed -1/19--Vitamin K 10 mg IV 1-->  -11/05/15--repeat vitamin K  IV x 1-- -11/06/15--repeat vitamin K (3rd dose) -11/07/15--repeat vitamin K ESRD on HD M/W/F HD fistula placed 09/03/15 in L UE w/ good thrill to palpation -HD catheter and right chest, follow-up blood cultures, HD per renal -appreciate nephrology Warfarin induced coagulopathy Dosing per Rx - follow  -Reverse warfarin as discussed above Transaminasemia -Likely due to hepatic congestion from chronic right heart failure -Discontinue fluconazole and Crestor -RUQ ultrasound with GB thickening -HIDA--neg for cystic duct or CBD obstruction -given difficulty in reversing INR, 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  Chronic Afib w/ acute RVR  CHADS-VASc 8 - on chronic warfarin - rate controlled  -10/24/15--Echo EF 40-45%, severe TR -Restart carvedilol-->increase dose to 6.25 mg twice a day -rate controlled -start heparin as INR now <2  Chronic systolic CHF  -Volume management per HD  -10/24/15--Echo EF 40-45%, severe TR -Restart carvedilol lower dose -plan to consult CHF team due to persistent evidence for elevate R-heart pressures  Elevated troponin - CAD s/p PCI RCA  Trop peaked at 0.56 - most c/w demand ischemia/rate related - no chest pain   DM2 -Hemoglobin A1c--5.9 -Patient normally takes glipizide at home -NovoLog sliding scale  HTN BP currently well controlled/soft -continue carvedilol   Hypothyroidism s/p thyroidectomy  Cont home synthroid dose   Anemia of CKD -B12 marginally low -supplement  Stage 1 sacral wound -not infected on exam -consult wound care -present at time of admission  Code Status: DNR--discussed with [name] Family Communication: updated son [name] on phone 1/22--total time 35 min Disposition Plan:  SNF,  pending workup  Consultants: Nephrology   Procedures: 1/8 TTE - EF 40-45% - hypokinesis anteroseptal myocardium - mild  mitral valve regurg - LA mildly dilated - RA mod dilated - severe TR - PA pressure 39mm Hg  Antibiotics: Zosyn (prior to admit)  Vanc (prior to admit) >1/18 Meropenem 1/15 >1/21   Family Communication: Sister updated at beside 1/21 Disposition Plan: SNF when medically stable   Procedures/Studies: Dg Chest 2 View  10/24/2015  CLINICAL DATA:  Nonproductive cough, hypoxemia. EXAM: CHEST  2 VIEW COMPARISON:  Chest x-rays dated 10/20/2015 and 09/04/2015. FINDINGS: Fairly severe cardiomegaly is unchanged. Right chest wall dual lumen catheter is stable in position with tip projected over the right atrium. Central pulmonary vascular congestion and mild bilateral interstitial edema is unchanged, suspected to be chronic. New confluent airspace opacity is seen at the right lung base. IMPRESSION: 1. New confluent airspace opacity at the right lung base, pneumonia versus aspiration. 2. Stable cardiomegaly. The mild central pulmonary vascular congestion and bilateral interstitial edema is not significantly changed, most likely related to a chronic mild CHF. Electronically Signed   By: Bary Richard M.D.   On: 10/24/2015 11:10   Dg Chest 2 View  10/20/2015  CLINICAL DATA:  Acute onset of altered mental status. Tachycardia. Initial encounter. EXAM: CHEST  2 VIEW COMPARISON:  Chest radiograph performed 09/04/2015 FINDINGS: The lungs are well-aerated. Vascular congestion is noted. A small left pleural effusion is suspected. Increased interstitial markings likely reflect pulmonary edema. There is no evidence of pneumothorax. The heart is enlarged. No acute osseous abnormalities are seen. A right-sided dual-lumen catheter is noted ending about the cavoatrial junction. IMPRESSION: Vascular congestion and cardiomegaly. Small left pleural effusion suspected. Increased interstitial markings likely  reflect pulmonary edema. Electronically Signed   By: [name] M.D.   On: 10/20/2015 22:00   Ct Head Wo Contrast  10/20/2015  CLINICAL DATA:  Altered mental status EXAM: CT HEAD WITHOUT CONTRAST TECHNIQUE: Contiguous axial images were obtained from the base of the skull through the vertex without intravenous contrast. COMPARISON:  Head CT July 19, 2015; brain MRI July 20, 2015 FINDINGS: There is stable age related volume loss. There is no intracranial mass, hemorrhage, extra-axial fluid collection, or midline shift. There is stable slight small vessel disease in the centra semiovale bilaterally. Elsewhere gray-white compartments are normal. No acute infarct is evident. Calcification along the falx is stable. Bony calvarium appears intact. The mastoid air cells are clear. No intraorbital lesions are identified. IMPRESSION: Slight periventricular small vessel disease, stable. No intracranial mass, hemorrhage, or new gray - white compartment lesions/acute appearing infarct. Stable appearance compared to prior studies. Electronically Signed   By: Bretta Bang III M.D.   On: 10/20/2015 21:26   Ct Chest W Contrast  11/04/2015  CLINICAL DATA:  Fever of unknown origin. History of coronary disease hypertension, congestive heart failure chronic renal disease. EXAM: CT CHEST, ABDOMEN, AND PELVIS WITH CONTRAST TECHNIQUE: Multidetector CT imaging of the chest, abdomen and pelvis was performed following the standard protocol during bolus administration of intravenous contrast. CONTRAST:  OMNIPAQUE IOHEXOL 300 MG/ML  SOLN COMPARISON:  Chest radiograph 10/24/2015, 10/20/2015 on CT thorax 03/12/2004 FINDINGS: CT CHEST FINDINGS Mediastinum/Nodes: No axillary or supraclavicular adenopathy. The paratracheal adenopathy is present. Example 16 mm short axis RIGHT lower paratracheal lymph node which is increased from 12 mm on prior. RIGHT hilar lymph node measures 20 mm on image 25 series 201 increased from 18 mm.  AP window node measures 9 mm compared to 11 mm. No central pulmonary embolism. Borderline enlarged LEFT supraclavicular lymph node measures 11 mm (image 5, series  201) Cardiomegaly noted. Lungs/Pleura: There is interlobular septal thickening in the lungs. Extensive centrilobular emphysema. There is loculated fluid collections along the RIGHT oblique fissure. No consolidation to suggest active pulmonary infection. Musculoskeletal: No aggressive osseous lesion. CT ABDOMEN AND PELVIS FINDINGS Hepatobiliary: There is reflux from the injection bolus into the hepatic veins consistent with RIGHT heart insufficiency. No focal hepatic lesion. Several gallstones collect within the decompressed gallbladder. No biliary duct dilatation. Pancreas: Pancreas is normal. No ductal dilatation. No pancreatic inflammation. Spleen: Low-attenuation lesion within the spleen is rounded measuring 3.8 cm. This is new from 2005. Adrenals/urinary tract: Adrenal glands are normal. Poor renal cortical enhancement. High-density lesion measuring 8 mm in the RIGHT renal cortex likely represents hemorrhagic cyst. Ureters and bladder normal. The bladder is collapsed Stomach/Bowel: Stomach, small bowel and cecum are normal. The appendix is not identified. Small amount fluid inferior to the cecal tip (image 99 of the axial series). Colon rectosigmoid colon are normal. No evidence of colonic inflammation or infection Vascular/Lymphatic: Abdominal aorta is normal caliber with atherosclerotic calcification. There is no retroperitoneal or periportal lymphadenopathy. No pelvic lymphadenopathy. Reproductive: Prostate normal Other: No abscess in the abdomen pelvis. Musculoskeletal: No aggressive osseous lesion. Gas in the L4-L5 disc space likely relates to degenerative change. There is significant arthropathy at the facet joints at this level. IMPRESSION: Chest Impression: 1. Progressive mediastinal and hilar lymphadenopathy from CT 2005. This may relate to  congestive heart failure. Cannot exclude lymphoproliferative process such as CLL. No significant adenopathy in the abdomen or axilla would favor against lymphoma. 2. Cardiomegaly and loculated pleural effusions with interlobular septal thickening likely relates to congestive heart failure. RIGHT heart insufficiency noted with contrast reflux into hepatic veins. 3. Centrilobular emphysema. 4. No evidence of acute pulmonary infection. Abdomen / Pelvis Impression: 1. Large round lesion within the spleen is new from 2005. In patient with fever of unknown origin cannot exclude a splenic abscess. No evidence of enhancement with favor benign cyst. Consider MRI of the abdomen with contrast. Of note, if patient cannot hold breath then MRI may not be a suitable imaging modality. In that case, consider FDG PET scan for evaluation if clinical suspicion warrants. 2. No evidence of abscess or inflammation the peritoneal space. 3. Small amount of free fluid along the RIGHT pericolic gutter likely relates to heart failure described above. 4. Cholelithiasis without evidence acute cholecystitis. Electronically Signed   By: Genevive Bi M.D.   On: 11/04/2015 08:29   Mr Brain Wo Contrast  11/07/2015  CLINICAL DATA:  Initial evaluation for acute encephalopathy. EXAM: MRI HEAD WITHOUT CONTRAST TECHNIQUE: Multiplanar, multiecho pulse sequences of the brain and surrounding structures were obtained without intravenous contrast. COMPARISON:  Prior MRI from 07/20/2015. FINDINGS: Study degraded by motion artifact. Diffuse prominence of the CSF containing spaces compatible with generalized age-related cerebral atrophy. Confluent T2/FLAIR hyperintensity within the periventricular white matter most consistent with mild chronic small vessel ischemic disease. No abnormal foci of restricted diffusion to suggest acute intracranial infarct. Gray-white matter differentiation maintained. Major intracranial vascular flow voids grossly preserved.  No acute or chronic intracranial hemorrhage. No mass lesion, midline shift, or mass effect. No hydrocephalus. No extra-axial fluid collection. Craniocervical junction within normal limits. Mild degenerative spondylolysis present within the upper cervical spine without stenosis. Pituitary gland within normal limits. No acute abnormality about the orbits. Mild mucosal thickening within the left maxillary sinus. Paranasal sinuses are otherwise largely clear. Trace opacity within the left mastoid air cells. Right mastoid air cells clear. Inner ear  structures within normal limits. Bone marrow signal intensity within normal limits. No scalp soft tissue abnormality. IMPRESSION: 1. No acute intracranial infarct or other process identified. 2. Age-related cerebral atrophy with mild chronic small vessel ischemic disease, stable. Electronically Signed   By: Rise Mu M.D.   On: 11/07/2015 00:13   Ct Abdomen Pelvis W Contrast  11/04/2015  CLINICAL DATA:  Fever of unknown origin. History of coronary disease hypertension, congestive heart failure chronic renal disease. EXAM: CT CHEST, ABDOMEN, AND PELVIS WITH CONTRAST TECHNIQUE: Multidetector CT imaging of the chest, abdomen and pelvis was performed following the standard protocol during bolus administration of intravenous contrast. CONTRAST:  OMNIPAQUE IOHEXOL 300 MG/ML  SOLN COMPARISON:  Chest radiograph 10/24/2015, 10/20/2015 on CT thorax 03/12/2004 FINDINGS: CT CHEST FINDINGS Mediastinum/Nodes: No axillary or supraclavicular adenopathy. The paratracheal adenopathy is present. Example 16 mm short axis RIGHT lower paratracheal lymph node which is increased from 12 mm on prior. RIGHT hilar lymph node measures 20 mm on image 25 series 201 increased from 18 mm. AP window node measures 9 mm compared to 11 mm. No central pulmonary embolism. Borderline enlarged LEFT supraclavicular lymph node measures 11 mm (image 5, series 201) Cardiomegaly noted. Lungs/Pleura:  There is interlobular septal thickening in the lungs. Extensive centrilobular emphysema. There is loculated fluid collections along the RIGHT oblique fissure. No consolidation to suggest active pulmonary infection. Musculoskeletal: No aggressive osseous lesion. CT ABDOMEN AND PELVIS FINDINGS Hepatobiliary: There is reflux from the injection bolus into the hepatic veins consistent with RIGHT heart insufficiency. No focal hepatic lesion. Several gallstones collect within the decompressed gallbladder. No biliary duct dilatation. Pancreas: Pancreas is normal. No ductal dilatation. No pancreatic inflammation. Spleen: Low-attenuation lesion within the spleen is rounded measuring 3.8 cm. This is new from 2005. Adrenals/urinary tract: Adrenal glands are normal. Poor renal cortical enhancement. High-density lesion measuring 8 mm in the RIGHT renal cortex likely represents hemorrhagic cyst. Ureters and bladder normal. The bladder is collapsed Stomach/Bowel: Stomach, small bowel and cecum are normal. The appendix is not identified. Small amount fluid inferior to the cecal tip (image 99 of the axial series). Colon rectosigmoid colon are normal. No evidence of colonic inflammation or infection Vascular/Lymphatic: Abdominal aorta is normal caliber with atherosclerotic calcification. There is no retroperitoneal or periportal lymphadenopathy. No pelvic lymphadenopathy. Reproductive: Prostate normal Other: No abscess in the abdomen pelvis. Musculoskeletal: No aggressive osseous lesion. Gas in the L4-L5 disc space likely relates to degenerative change. There is significant arthropathy at the facet joints at this level. IMPRESSION: Chest Impression: 1. Progressive mediastinal and hilar lymphadenopathy from CT 2005. This may relate to congestive heart failure. Cannot exclude lymphoproliferative process such as CLL. No significant adenopathy in the abdomen or axilla would favor against lymphoma. 2. Cardiomegaly and loculated pleural  effusions with interlobular septal thickening likely relates to congestive heart failure. RIGHT heart insufficiency noted with contrast reflux into hepatic veins. 3. Centrilobular emphysema. 4. No evidence of acute pulmonary infection. Abdomen / Pelvis Impression: 1. Large round lesion within the spleen is new from 2005. In patient with fever of unknown origin cannot exclude a splenic abscess. No evidence of enhancement with favor benign cyst. Consider MRI of the abdomen with contrast. Of note, if patient cannot hold breath then MRI may not be a suitable imaging modality. In that case, consider FDG PET scan for evaluation if clinical suspicion warrants. 2. No evidence of abscess or inflammation the peritoneal space. 3. Small amount of free fluid along the RIGHT pericolic gutter likely  relates to heart failure described above. 4. Cholelithiasis without evidence acute cholecystitis. Electronically Signed   By: Genevive Bi M.D.   On: 11/04/2015 08:29   Mr Knee Left  Wo Contrast  11/03/2015  CLINICAL DATA:  80 year old in patient on dialysis with left-greater-than-right knee pain. Fever of unknown origin. EXAM: MRI OF THE LEFT KNEE WITHOUT CONTRAST TECHNIQUE: Multiplanar, multisequence MR imaging of the knee was performed. No intravenous contrast was administered. COMPARISON:  None. FINDINGS: Study is mildly motion degraded. MENISCI Medial meniscus: Meniscal degeneration with irregular radial tear in the posterior horn. The meniscal root is intact. No centrally displaced meniscal fragment. Lateral meniscus:  Meniscal degeneration without discrete tear. LIGAMENTS Cruciates:  Intact with mild mucoid degeneration. Collaterals: Intact. There is a small amount of medial bursal fluid. The popliteus tendon is thickened near its femoral insertion. CARTILAGE Patellofemoral:  Adequately preserved. Medial: Mild chondral thinning and surface irregularity. There is mild subchondral edema and intraosseous ganglion formation  throughout the medial tibial plateau. Lateral:  Adequately preserved. OTHER Joint: Moderate size complex joint effusion consistent with synovitis. No discrete intra-articular loose bodies observed. Popliteal Fossa:  Small mildly complex Baker's cyst. Extensor Mechanism: The distal quadriceps tendon appears normal. There is patellar tendinosis with a long segment of tubular T2 hyperintensity in the medial aspect of the tendon Bones: There are several intraosseous ganglia in the proximal tibia, largest posteriorly in the medial tibial plateau, measuring up to 1.6 cm. There is no evidence of periarticular bone marrow edema or osteomyelitis. IMPRESSION: 1. Degenerative radial tear of the posterior horn of the medial meniscus. 2. Lateral meniscal degeneration without evidence of tear. 3. Complex joint effusion consistent with synovitis. No specific evidence of infection or osteomyelitis. Correlate clinically. 4. Intra tendinous cyst formation medially in the patellar tendon, likely a ganglion cyst. This could indicate underlying interstitial tendon tearing. Electronically Signed   By: Carey Bullocks M.D.   On: 11/03/2015 19:59   Nm Hepato W/eject Fract  11/05/2015  CLINICAL DATA:  Gallbladder wall thickening. Multiple stones. Elevated LFTs. EXAM: NUCLEAR MEDICINE HEPATOBILIARY IMAGING TECHNIQUE: Sequential images of the abdomen were obtained out to 60 minutes following intravenous administration of radiopharmaceutical. RADIOPHARMACEUTICALS:  5.2 mCi Tc-46m  Choletec IV COMPARISON:  11/04/2015 ultrasound FINDINGS: There is prompt uptake and excretion of radiotracer by the liver. Activity is seen in the gallbladder by 30 minutes and small bowel by 55 minutes. IMPRESSION: No evidence of cystic duct or common bile duct obstruction. Electronically Signed   By: Charlett Nose M.D.   On: 11/05/2015 15:29   Dg Chest Port 1 View  10/30/2015  CLINICAL DATA:  Acute onset of shortness of breath. Initial encounter. EXAM:  PORTABLE CHEST 1 VIEW COMPARISON:  Chest radiograph performed 10/24/2015 FINDINGS: The patient's right-sided dual-lumen catheter is noted ending overlying the right atrium. The lungs are well-aerated. Vascular congestion is noted. Increased interstitial markings raise concern for mild interstitial edema. A small left pleural effusion may be present. No pneumothorax is seen. The cardiomediastinal silhouette is mildly enlarged. No acute osseous abnormalities are seen. IMPRESSION: Vascular congestion and mild cardiomegaly. Increased interstitial markings raise concern for mild interstitial edema. Small left pleural effusion may be present. Electronically Signed   By: [name] M.D.   On: 10/30/2015 23:54   US Abdomen Limited Ruq  11/04/2015  CLINICAL DATA:  Elevated liver function tests. EXAM: US ABDOMEN LIMITED - RIGHT UPPER QUADRANT COMPARISON:  Chest, abdomen and pelvis CT obtained earlier today. FINDINGS: Gallbladder: Diffuse gallbladder wall thickening measuring  7 mm in thickness. Multiple small echogenic foci adherent to the walls of the gallbladder without shadowing. Small amount of corresponding calcific density on the recent CT. Mild pericholecystic fluid. No tenderness over the gallbladder today. Common bile duct: Diameter: 2.0 mm Liver: Dilated hepatic veins. To and fro flow within the portal vein. Minimal ascites. IMPRESSION: 1. Diffuse gallbladder wall thickening. This could be due to right heart failure or chronic cholecystitis. 2. Multiple tiny, adherent stones in the gallbladder. 3. Dilated hepatic veins and to and fro flow in the portal vein, compatible with hepatic congestion due to right heart failure. 4. Minimal ascites and mild pericholecystic fluid. Electronically Signed   By: Beckie Salts M.D.   On: 11/04/2015 16:30         Subjective: Patient denies fevers, chills, headache, chest pain, dyspnea, nausea, vomiting, diarrhea, abdominal pain, dysuria, hematuria; denies any headache  or neck pain. Complains of bilateral knee pain.    Objective: Filed Vitals:   11/06/15 1700 11/06/15 2049 11/07/15 0559 11/07/15 0923  BP: 138/72 106/70 110/79 103/67  Pulse: 87 96 73 80  Temp: 98.4 F (36.9 C) 98 F (36.7 C) 98 F (36.7 C) 98.7 F (37.1 C)  TempSrc: Oral   Oral  Resp: 18 20 18 18   Height:      Weight:  71 kg (156 lb 8.4 oz)    SpO2: 98% 98% 96% 96%    Intake/Output Summary (Last 24 hours) at 11/07/15 1704 Last data filed at 11/07/15 0900  Gross per 24 hour  Intake    363 ml  Output      0 ml  Net    363 ml   Weight change: -0.6 kg (-1 lb 5.2 oz) Exam:   General:  Pt is alert, follows commands appropriately, not in acute distress  HEENT: No icterus, No thrush, No neck mass, [place]/AT  Cardiovascular: RRR, S1/S2, no rubs, no gallops  Respiratory: CTA bilaterally, no wheezing, no crackles, no rhonchi  Abdomen: Soft/+BS, non tender, non distended, no guarding  Extremities: No edema, No lymphangitis, No petechiae, No rashes, no synovitis  Data Reviewed: Basic Metabolic Panel:  Recent Labs Lab 11/01/15 0212  11/03/15 0122 11/03/15 0900 11/04/15 0554 11/05/15 0526 11/06/15 0655 11/07/15 0800  NA 135  < > 136  --  135 133* 136 135  K 3.8  < > 4.2  --  4.8 5.0 4.5 5.4*  CL 100*  < > 97*  --  100* 97* 100* 96*  CO2 23  < > 25  --  24 21* 24 23  GLUCOSE 271*  < > 229*  --  101* 128* 165* 128*  BUN 56*  < > 38*  --  33* 52* 24* 36*  CREATININE 7.84*  < > 6.14*  --  5.32* 6.93* 4.43* 6.19*  CALCIUM 8.2*  < > 8.5*  --  8.9 9.1 9.0 9.3  MG 2.1  --   --   --   --   --   --   --   PHOS  --   --   --  4.0  --   --   --   --   < > = values in this interval not displayed. Liver Function Tests:  Recent Labs Lab 11/03/15 0122 11/04/15 0554 11/05/15 0526 11/06/15 0655 11/07/15 0800  AST 171* 484* 715* 563* 625*  ALT 33 66* 94* 91* 99*  ALKPHOS 164* 192* 247* 292* 324*  BILITOT 0.6 0.9 0.8 0.7 1.6*  PROT 6.4* 7.1 6.8 6.9 6.8  ALBUMIN 1.7* 1.9*  1.9* 1.8* 1.9*   No results for input(s): LIPASE, AMYLASE in the last 168 hours.  Recent Labs Lab 11/04/15 1355 11/06/15 0655 11/07/15 0920  AMMONIA 48* 41* 147*   CBC:  Recent Labs Lab 11/02/15 0644  11/04/15 0554 11/05/15 0526 11/05/15 1924 11/06/15 0655 11/07/15 0800  WBC 5.4  < > 6.8 6.3 4.6 5.0 4.8  NEUTROABS 3.0  --  3.8 3.9  --  2.9 3.0  HGB 10.6*  < > 10.9* 10.9* 10.8* 11.2* 11.2*  HCT 34.9*  < > 34.7* 33.9* 35.3* 36.1* 36.0*  MCV 95.4  < > 94.3 91.6 93.4 94.0 93.5  PLT 122*  < > 131* 104* 125* 82* 94*  < > = values in this interval not displayed. Cardiac Enzymes:  Recent Labs Lab 10/31/15 2010 11/01/15 0212 11/01/15 1226  TROPONINI 0.39* 0.36* 0.25*   BNP: Invalid input(s): POCBNP CBG:  Recent Labs Lab 11/06/15 1129 11/06/15 1627 11/06/15 2228 11/07/15 0722 11/07/15 1151  GLUCAP 126* 151* 75 119* 162*    Recent Results (from the past 240 hour(s))  Blood Culture (routine x 2)     Status: None   Collection Time: 10/31/15 12:45 AM  Result Value Ref Range Status   Specimen Description BLOOD RIGHT HAND  Final   Special Requests IN PEDIATRIC BOTTLE 3CC  Final   Culture NO GROWTH 5 DAYS  Final   Report Status 11/05/2015 FINAL  Final  Blood Culture (routine x 2)     Status: None   Collection Time: 10/31/15 12:49 AM  Result Value Ref Range Status   Specimen Description BLOOD LEFT HAND  Final   Special Requests IN PEDIATRIC BOTTLE 4CC  Final   Culture NO GROWTH 5 DAYS  Final   Report Status 11/05/2015 FINAL  Final  Urine culture     Status: None   Collection Time: 10/31/15  2:02 AM  Result Value Ref Range Status   Specimen Description URINE, CATHETERIZED  Final   Special Requests NONE  Final   Culture >=100,000 COLONIES/mL YEAST  Final   Report Status 11/01/2015 FINAL  Final     Scheduled Meds: . calcium acetate  667 mg Oral TID WC  . carvedilol  6.25 mg Oral BID WC  . vitamin B-12  500 mcg Oral Daily  . darbepoetin (ARANESP) injection -  DIALYSIS  40 mcg Intravenous Q Fri-HD  . doxercalciferol  1 mcg Intravenous Q M,W,F-HD  . feeding supplement (NEPRO CARB STEADY)  237 mL Oral BID BM  . insulin aspart  0-9 Units Subcutaneous TID WC  . lactulose  20 g Oral TID  . levothyroxine  175 mcg Oral QAC breakfast  . multivitamin  1 tablet Oral Daily  . phytonadione (VITAMIN K) IV  10 mg Intravenous Once  . sodium chloride  3 mL Intravenous Q12H   Continuous Infusions:    Keiran Gaffey, DO  [facility] Pager [phone]  If 7PM-7AM, please contact night-coverage [email or web address] Password [email or web address] 11/07/2015, 5:04 PM   LOS: 7 days

## 2015-11-08 ENCOUNTER — Inpatient Hospital Stay (HOSPITAL_COMMUNITY): Payer: Medicare Other

## 2015-11-08 DIAGNOSIS — R740 Nonspecific elevation of levels of transaminase and lactic acid dehydrogenase [LDH]: Secondary | ICD-10-CM

## 2015-11-08 DIAGNOSIS — I482 Chronic atrial fibrillation: Secondary | ICD-10-CM

## 2015-11-08 LAB — GLUCOSE, CAPILLARY
GLUCOSE-CAPILLARY: 77 mg/dL (ref 65–99)
GLUCOSE-CAPILLARY: 151 mg/dL — AB (ref 65–99)
Glucose-Capillary: 134 mg/dL — ABNORMAL HIGH (ref 65–99)
Glucose-Capillary: 123 mg/dL — ABNORMAL HIGH (ref 65–99)

## 2015-11-08 LAB — SYNOVIAL CELL COUNT + DIFF, W/ CRYSTALS
Eosinophils-Synovial: 0 % (ref 0–1)
LYMPHOCYTES-SYNOVIAL FLD: 3 % (ref 0–20)
MONOCYTE-MACROPHAGE-SYNOVIAL FLUID: 18 % — AB (ref 50–90)
Neutrophil, Synovial: 79 % — ABNORMAL HIGH (ref 0–25)
OTHER CELLS-SYN: 0
WBC, Synovial: 2025 /mm3 — ABNORMAL HIGH (ref 0–200)

## 2015-11-08 LAB — COMPREHENSIVE METABOLIC PANEL
ALT: 92 U/L — ABNORMAL HIGH (ref 17–63)
AST: 606 U/L — AB (ref 15–41)
Albumin: 1.8 g/dL — ABNORMAL LOW (ref 3.5–5.0)
Alkaline Phosphatase: 396 U/L — ABNORMAL HIGH (ref 38–126)
Anion gap: 17 — ABNORMAL HIGH (ref 5–15)
BILIRUBIN TOTAL: 0.9 mg/dL (ref 0.3–1.2)
BUN: 47 mg/dL — AB (ref 6–20)
CALCIUM: 9.6 mg/dL (ref 8.9–10.3)
CO2: 22 mmol/L (ref 22–32)
CREATININE: 7.26 mg/dL — AB (ref 0.61–1.24)
Chloride: 97 mmol/L — ABNORMAL LOW (ref 101–111)
GFR calc Af Amer: 7 mL/min — ABNORMAL LOW (ref >60)
GFR, EST NON AFRICAN AMERICAN: 6 mL/min — AB (ref >60)
Glucose, Bld: 135 mg/dL — ABNORMAL HIGH (ref 65–99)
POTASSIUM: 4.6 mmol/L (ref 3.5–5.1)
Sodium: 136 mmol/L (ref 135–145)
TOTAL PROTEIN: 6.8 g/dL (ref 6.5–8.1)

## 2015-11-08 LAB — CBC
HEMATOCRIT: 33.2 % — AB (ref 39.0–52.0)
Hemoglobin: 10.5 g/dL — ABNORMAL LOW (ref 13.0–17.0)
MCH: 29.5 pg (ref 26.0–34.0)
MCHC: 31.6 g/dL (ref 30.0–36.0)
MCV: 93.3 fL (ref 78.0–100.0)
Platelets: 61 10*3/uL — ABNORMAL LOW (ref 150–400)
RBC: 3.56 MIL/uL — ABNORMAL LOW (ref 4.22–5.81)
RDW: 18.8 % — AB (ref 11.5–15.5)
WBC: 5 10*3/uL (ref 4.0–10.5)

## 2015-11-08 LAB — PROTIME-INR
INR: 2.8 — ABNORMAL HIGH (ref 0.00–1.49)
PROTHROMBIN TIME: 29.1 s — AB (ref 11.6–15.2)

## 2015-11-08 LAB — GRAM STAIN

## 2015-11-08 LAB — SEDIMENTATION RATE: Sed Rate: 114 mm/hr — ABNORMAL HIGH (ref 0–16)

## 2015-11-08 LAB — AMMONIA: AMMONIA: 21 umol/L (ref 9–35)

## 2015-11-08 MED ORDER — ALTEPLASE 2 MG IJ SOLR
2.0000 mg | Freq: Once | INTRAMUSCULAR | Status: DC | PRN
  Filled 2015-11-08: qty 2

## 2015-11-08 MED ORDER — LIDOCAINE-PRILOCAINE 2.5-2.5 % EX CREA
1.0000 "application " | TOPICAL_CREAM | CUTANEOUS | Status: DC | PRN
  Filled 2015-11-08: qty 5

## 2015-11-08 MED ORDER — SODIUM CHLORIDE 0.9 % IV SOLN: 100.0000 mL | INTRAVENOUS | Status: DC | PRN

## 2015-11-08 MED ORDER — PENTAFLUOROPROP-TETRAFLUOROETH EX AERO
1.0000 | INHALATION_SPRAY | CUTANEOUS | Status: DC | PRN
Start: 2015-11-08 — End: 2015-11-08

## 2015-11-08 MED ORDER — HEPARIN SODIUM (PORCINE) 1000 UNIT/ML DIALYSIS: 1000.0000 [IU] | INTRAMUSCULAR | Status: DC | PRN

## 2015-11-08 MED ORDER — DOXERCALCIFEROL 4 MCG/2ML IV SOLN
INTRAVENOUS | Status: AC
  Filled 2015-11-08: qty 2

## 2015-11-08 MED ORDER — IOHEXOL 180 MG/ML  SOLN: 20.0000 mL | Freq: Once | INTRAMUSCULAR | Status: DC | PRN

## 2015-11-08 MED ORDER — HEPARIN SODIUM (PORCINE) 1000 UNIT/ML DIALYSIS: 1500.0000 [IU] | Freq: Once | INTRAMUSCULAR | Status: DC

## 2015-11-08 MED ORDER — SODIUM CHLORIDE 0.9 % IJ SOLN
INTRAMUSCULAR | Status: AC
  Filled 2015-11-08: qty 10

## 2015-11-08 MED ORDER — LIDOCAINE HCL (PF) 1 % IJ SOLN: 5.0000 mL | INTRAMUSCULAR | Status: DC | PRN

## 2015-11-08 MED ORDER — LIDOCAINE HCL (PF) 1 % IJ SOLN
INTRAMUSCULAR | Status: AC
  Filled 2015-11-08: qty 5

## 2015-11-08 NOTE — Consult Note (Signed)
Chart review/[facility] admissions: 2004 - ARDS/resp failure/ trach/ pseudomonas infection , CKD, HTN difficult to control, anemia, DM poor control 2004 - CAP/ MRSA , DM, CHF, deconditioning, CKD, HTN  2004 - CHF vs PNA, AKI/ CKD, HTN, PAF  2005 - CHF exac/ vol ^^, CP, A/c renal, HTN, low T4 2005 - NQWMI > PCI/ stent to RCA, HL, DM, CKD, HTN 2006 - CHF exac, a/c renal, DM2, hypoxemia resolved 2007 - atypical CP, CKD 2008 - atypical CP, known CAD hx RCA staent, LVEF 41% by myoview, HTN, DM,hx TIA 2008 - weak/ dizzy/ blurred vision > poss VBI related to hypotension/ bradycardia. Held clonidine. Improved. MRI/MRA negative 2012 - a/c renal stage 4, a/c chf resolved. Gout arthritis, DM2, UTI, low T4, HTN, prior CVA, LV 35% 2014 - chest pain, hx stents > nuclear stress test negative. EF 70%. HL, HTN Feb 2016 - chest pain > myoview EF 39%, fixed defect, small ischemia area. CKD IV so not cath'd.  Oct 2016 - acute AMS/ lethargy/ fever/ resp fail > prob RLL PNA rx with abx and IV lasix for vol excess. New afib started coumadin. LV 45%. Urine retnetion. CKD 4.  Nov 2016 - HCAP/ RML pna and a/c chf rx with abx and IV lasix. Improved. BCx neg. Chg to po abx , dc'd Nov 2016 - came back after PNA/CHF admission in 3 days w AMS and Nicola Girt.  Started on HD 09/07/15. Low plts with +HIT initial, SRA .  AVF placed. Allopurinol / Crestor stopped for low plts. MS better. DM2HTN. Jan 4-Oct 25, 2015 > gen weakness x 3 days, unable to walk. Confused in ED, fever 100.9, tachypnea. RLL infiltrate rx w vanc/ fortaz. Developed afib/ RVR while here rx diltiazem.   [name] MD pager [phone]    cell [phone] 11/08/2015, 10:15 AM

## 2015-11-08 NOTE — Progress Notes (Signed)
Subjective:  No cos on HD.  Objective Vital signs in last 24 hours: Filed Vitals:   11/07/15 1741 11/07/15 2100 11/08/15 0500 11/08/15 0801  BP: 100/59 105/57 107/58 102/64  Pulse: 78 88 63 91  Temp: 98.4 F (36.9 C) 98.3 F (36.8 C) 97.8 F (36.6 C) 97.9 F (36.6 C)  TempSrc: Oral  Oral Oral  Resp: Height:      Weight:      SpO2: 96% 98% 98% 100%   Weight change:    Physical Exam General: on HD, Pleasantly confused = "Friday"/ about at [facility] Mentally/ NAD Heart: Vent stable 80s and . Irreg. No rub, mur or gal. Lungs: bilateral CTA Abdomen: soft nontender active bs  Extremities: no  Pedal edema Dialysis Access: Maturing LUA AVF, R IJ  perm cath. patent on HD   Dialysis: [facility] 4h 400/800 72.5kg 2/2.25 bath R IJ cath/ LUA AVF maturing Hep 1500 Hect 1 ug tiw    Problem/Plan: 1. Fevers- +urine cx grew fungus, is s/p 3 d of diflucan, bc= neg , CXR negative. Other abx dc'd as well. HD cath site clear, maturing AVF L arm clean wounds.. -CT =right splenic cyst,  cholelithiasis without cholecystitis -CT chest--right hilar and right paratracheal lymph node; loculated pleural effusion right oblique fissure without consolidation noted admit plans  -MRI left knee irregular meniscal tear of the posterior horn with moderately complex joint effusion -RUQ ultrasound--diffuse gallbladder wall thickening--Rside CHF vs chronic cholecystitis -HIDA scan--neg Note admit plans Arthrocentesis when IR <1.5  today 2.8 2. ESRD - MWF HD on schedule started HD Nov 2016 3. Anemia - HGB 11.2. On ESA. Follow HGB.  4. Secondary hyperparathyroidism - P 4 - on one phoslo ac; resumed Hectorol 1 ac will hold with correct Ca >10 5. HTN/volume - NO UF today /  at dry wt, BP's on low side. ON  Home coreg/ dilt on hold. 6. Nutrition - renal diet/vitamin/nepro alb 1.9 7. ^ LFTS - trending up. Diflucan stopped. CT of abdomen revealed Cholelithiasis without evidence acute cholecystitis,  no acute processes in peritoneal space. Has several issues which point to liver failure including low plts since late 2016, ^NH3, uncorrectable INR after  IV vit K, declining coumadin dose to  last admission, ^LFT's, CT contrast refluxing into liver suggesting RHF. He has had very long hx of chron systolic CHF w multiple admissions over the years. Suspect liver failure related to chron hepatic congestion from LV +/- RV failure.  Remains lethargic and partially oriented, suspect still hepatic enceph. He is getting lactulose. Poor prognosis overall now with also ESRD.  Have d/w primary MD.  8. DM - off meds - per primary 9. Dementia - was worse with infection, now at about baseline MS, has improved/ MRI head yest + age related atrophy   10. Chronic afib -no coumadin 11. Hypothyroidism -TSH 3.1 1/6 12. Thrombocytopenia- follow -  13. LVEF 40-45% by echo 1/8, LA/RA dilated, severe PA pressure elevation 39 mmHg   [name], PA-C [facility] Beeper [phone] 11/08/2015,8:17 AM  LOS: 8 days   Pt seen, examined, agree w assess/plan as above with additions as indicated.  [name] MD [facility] pager [phone]    cell [phone] 11/08/2015, 12:02 PM     Labs: Basic Metabolic Panel:  Recent Labs Lab 11/03/15 0900  11/05/15 0526 11/06/15 0655 11/07/15 0800  NA  --   < > 133* 136 135  K  --   < >  5.0 4.5 5.4*  CL  --   < > 97* 100* 96*  CO2  --   < > 21* 24 23  GLUCOSE  --   < > 128* 165* 128*  BUN  --   < > 52* 24* 36*  CREATININE  --   < > 6.93* 4.43* 6.19*  CALCIUM  --   < > 9.1 9.0 9.3  PHOS 4.0  --   --   --   --   < > = values in this interval not displayed. Liver Function Tests:  Recent Labs Lab 11/05/15 0526 11/06/15 0655 11/07/15 0800  AST 715* 563* 625*  ALT 94* 91* 99*  ALKPHOS 247* 292* 324*  BILITOT 0.8 0.7 1.6*  PROT 6.8 6.9 6.8  ALBUMIN 1.9* 1.8* 1.9*   No results for input(s): LIPASE, AMYLASE in the last 168  hours.  Recent Labs Lab 11/04/15 1355 11/06/15 0655 11/07/15 0920  AMMONIA 48* 41* 147*   CBC:  Recent Labs Lab 11/04/15 0554 11/05/15 0526 11/05/15 1924 11/06/15 0655 11/07/15 0800  WBC 6.8 6.3 4.6 5.0 4.8  NEUTROABS 3.8 3.9  --  2.9 3.0  HGB 10.9* 10.9* 10.8* 11.2* 11.2*  HCT 34.7* 33.9* 35.3* 36.1* 36.0*  MCV 94.3 91.6 93.4 94.0 93.5  PLT 131* 104* 125* 82* 94*   Cardiac Enzymes:  Recent Labs Lab 11/01/15 1226  TROPONINI 0.25*   CBG:  Recent Labs Lab 11/07/15 0722 11/07/15 1151 11/07/15 1705 11/07/15 2112 11/08/15 0726  GLUCAP 119* 162* 140* 140* 134*    Studies/Results: Mr Brain Wo Contrast  11/07/2015  CLINICAL DATA:  Initial evaluation for acute encephalopathy. EXAM: MRI HEAD WITHOUT CONTRAST TECHNIQUE: Multiplanar, multiecho pulse sequences of the brain and surrounding structures were obtained without intravenous contrast. COMPARISON:  Prior MRI from 07/20/2015. FINDINGS: Study degraded by motion artifact. Diffuse prominence of the CSF containing spaces compatible with generalized age-related cerebral atrophy. Confluent T2/FLAIR hyperintensity within the periventricular white matter most consistent with mild chronic small vessel ischemic disease. No abnormal foci of restricted diffusion to suggest acute intracranial infarct. Gray-white matter differentiation maintained. Major intracranial vascular flow voids grossly preserved. No acute or chronic intracranial hemorrhage. No mass lesion, midline shift, or mass effect. No hydrocephalus. No extra-axial fluid collection. Craniocervical junction within normal limits. Mild degenerative spondylolysis present within the upper cervical spine without stenosis. Pituitary gland within normal limits. No acute abnormality about the orbits. Mild mucosal thickening within the left maxillary sinus. Paranasal sinuses are otherwise largely clear. Trace opacity within the left mastoid air cells. Right mastoid air cells clear. Inner  ear structures within normal limits. Bone marrow signal intensity within normal limits. No scalp soft tissue abnormality. IMPRESSION: 1. No acute intracranial infarct or other process identified. 2. Age-related cerebral atrophy with mild chronic small vessel ischemic disease, stable. Electronically Signed   By: Rise Mu M.D.   On: 11/07/2015 00:13   Medications:   . calcium acetate  667 mg Oral TID WC  . carvedilol  6.25 mg Oral BID WC  . vitamin B-12  500 mcg Oral Daily  . darbepoetin (ARANESP) injection - DIALYSIS  40 mcg Intravenous Q Fri-HD  . doxercalciferol  1 mcg Intravenous Q M,W,F-HD  . feeding supplement (NEPRO CARB STEADY)  237 mL Oral BID BM  . [START ON 11/09/2015] heparin  1,500 Units Dialysis Once in dialysis  . insulin aspart  0-9 Units Subcutaneous TID WC  . lactulose  20 g Oral TID  . levothyroxine  175  mcg Oral QAC breakfast  . multivitamin  1 tablet Oral Daily  . sodium chloride  3 mL Intravenous Q12H

## 2015-11-08 NOTE — Consult Note (Addendum)
CARDIOLOGY CONSULT NOTE   Patient ID: [name] [name] MRN: [id number] DOB/AGE: 1936-06-24 80 y.o.  Admit date: 10/30/2015  Primary Physician   [name], MD Primary Cardiologist   Dr.  [name] Reason for Consultation   Persisted elevate R-heart pressures Requesting Physician Dr. Arbutus Leas  HPI: [name] [name] is a 80 y.o. male with a history of CAD, HTN, HL, ESRD on HD MWF, CVA, DM, chronic afib on coumadin and with a recent admission 10/20/15 to 10/25/15 for HCAP, acute respiratory failure & sepsis who again brought to ED by family 10/31/15 with acute confusion and decreased appetite. He was found to be febrile to 102.6 and admitted with sepsis of unclear etiology who treated with IV antibiotic and continued to have intermittent fever and altered mental status. Blood cultures are negative. Currently discontinued abx and monitored clinically.  Urine culture positive for fungus and treated with diflucan, now discontinued due to elevated LFTs. CT of abdomen revealed Cholelithiasis without evidence acute cholecystitis, no acute processes in peritoneal space. RUQ ultrasound showed diffuse gallbladder wall thickening --> could be due to right heart failure or chronic cholecystitis and dilation of hepatic & portal veins. Cardiology is consulted for management of RHF that might be leading to liver failure to hepatic congestion.   MRI left knee showed irregular meniscal tear of the posterior horn with moderately complex joint effusion. Plan to perform arthrocentesis once INR less than 1.5.  He has been given IV vitamin K 10mg  x 4. INR of 2.8 today. IT was 1.72 yesterday.   EKG on admission showed afib at rate of 135bpm, diffuse ST depression for rate related. Unable to review telemetry as patient was off it. The patient denies CP, SOB, palpitation, LE edema, PND, orthopnea or syncope.   Past Medical History  Diagnosis Date  . CAD (coronary artery disease)     a. s/p PCI to mid and mid-distal RCA, 40%  sten L main, no sig dz LCx  b. myoview 11/24/2014 inferoapex ischemia, medical management due to high risk for contrast nephropathy  . HTN (hypertension)   . Hyperlipidemia   . Cerebrovascular accident (HCC)   . CHF (congestive heart failure) (HCC)     EF 35% echo 2012 ; EF 44% myoview 2012; EF 76% by [facility] November 2014  . Diabetes mellitus   . 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 Unspecified hypothyroidism   . Gout   . Respiratory failure, acute (HCC) 07/21/2015     Past Surgical History  Procedure Laterality Date  . Thyroidectomy    . Av fistula placement Left 09/10/2015    Procedure: BRACHIOCEPHALIC ARTERIOVENOUS (AV) FISTULA CREATION;  Surgeon: Nada Libman, MD;  Location: [facility] OR;  Service: Vascular;  Laterality: Left;    Allergies  Allergen Reactions  . Tomato Other (See Comments)    Too much acid - cannot take due to kidney issues    I have reviewed the patient's current medications . calcium acetate  667 mg Oral TID WC  . carvedilol  6.25 mg Oral BID WC  . vitamin B-12  500 mcg Oral Daily  . darbepoetin (ARANESP) injection - DIALYSIS  40 mcg Intravenous Q Fri-HD  . doxercalciferol  1 mcg Intravenous Q M,W,F-HD  . feeding supplement (NEPRO CARB STEADY)  237 mL Oral BID BM  . insulin aspart  0-9 Units Subcutaneous TID WC  . lactulose  20 g Oral TID  . levothyroxine  175 mcg Oral QAC breakfast  .  lidocaine (PF)      . multivitamin  1 tablet Oral Daily  . sodium chloride  3 mL Intravenous Q12H  . sodium chloride         acetaminophen, acetaminophen, iohexol  Prior to Admission medications   Medication Sig Start Date End Date Taking? Authorizing Provider  calcium acetate (PHOSLO) 667 MG capsule Take 1 capsule (667 mg total) by mouth 3 (three) times daily with meals. 09/30/15  Yes [name], MD  carvedilol (COREG) 12.5 MG tablet Take 1 tablet (12.5 mg total) by mouth 2 (two) times daily with a meal. 01/11/15  Yes [name] Patty, MD  cefTAZidime  2 g in dextrose 5 % 50 mL Inject 2 g into the vein every Monday, Wednesday, and Friday at 6 PM. Last dose on 10/29/15 10/25/15  Yes Catarina Hartshorn, MD  diltiazem (CARDIZEM CD) 120 MG 24 hr capsule Take 1 capsule (120 mg total) by mouth daily. 10/25/15  Yes Catarina Hartshorn, MD  glipiZIDE (GLUCOTROL) 5 MG tablet Take 2.5-5 mg by mouth 2 (two) times daily before a meal. Take 1/2 tablet (2.5 mg) daily before breakfast and 1 tablet (5 mg) before supper 05/01/15  Yes Historical Provider, MD  levothyroxine (SYNTHROID, LEVOTHROID) 175 MCG tablet Take 1 tablet (175 mcg total) by mouth daily. 08/13/15  Yes [name], MD  multivitamin (RENA-VIT) TABS tablet Take 1 tablet by mouth daily.   Yes Historical Provider, MD  nitroGLYCERIN (NITROSTAT) 0.4 MG SL tablet Place 1 tablet (0.4 mg total) under the tongue every 5 (five) minutes as needed for chest pain. 11/25/14 04/25/17 Yes Azalee Course, PA  olopatadine (PATANOL) 0.1 % ophthalmic solution Place 1 drop into both eyes 2 (two) times daily. Patient taking differently: Place 1 drop into both eyes daily.  04/20/15  Yes [name], MD  rosuvastatin (CRESTOR) 20 MG tablet Take 1 tablet (20 mg total) by mouth daily. 10/07/15  Yes [name], MD  Vancomycin (VANCOCIN) 750 MG/150ML SOLN Inject 150 mLs (750 mg total) into the vein every Monday, Wednesday, and Friday with hemodialysis. Last dose on 11/01/15 10/25/15  Yes Catarina Hartshorn, MD  vitamin B-12 500 MCG tablet Take 1 tablet (500 mcg total) by mouth daily. 10/25/15  Yes Catarina Hartshorn, MD  warfarin (COUMADIN) 5 MG tablet Take 5 mg by mouth daily. 10/12/15  Yes Historical Provider, MD  warfarin (COUMADIN) 1 MG tablet Take 1 tablet by mouth daily or as directed by coumadin clinic Patient not taking: Reported on 10/31/2015 10/25/15   Catarina Hartshorn, MD     Social History   Social History  . Marital Status: Married    Spouse Name: N/A  . Number of Children: N/A  . Years of Education: N/A   Occupational History  . Not on file.   Social  History Main Topics  . Smoking status: Former Smoker -- 0.50 packs/day    Types: Cigarettes    Quit date: 10/16/1953  . Smokeless tobacco: Not on file     Comment: quit in the 70's  . Alcohol Use: No  . Drug Use: No  . Sexual Activity: Not Currently   Other Topics Concern  . Not on file   Social History Narrative   Lives in [place] with his wife and has 6 kids.     Family Status  Relation Status Death Age  . Mother Deceased   . Father Deceased   . Maternal Grandmother Deceased   . Maternal Grandfather Deceased   .  Paternal Grandmother Deceased   . Paternal Grandfather Deceased    Family History  Problem Relation Age of Onset  . Kidney failure Mother   . Diabetes Mother   . Cancer Father     lung  . Heart attack Brother     massive heart attack in 61s  . Hypertension Mother     ROS:  Full 14 point review of systems complete and found to be negative unless listed above.  Physical Exam: Blood pressure 120/55, pulse 76, temperature 97.8 F (36.6 C), temperature source Oral, resp. rate 18, height  (1.753 m), weight 156 lb 12 oz (71.1 kg), SpO2 99 %.  General: chronically ill appearing  male in no acute distress who seems somewhat confused Head: Eyes PERRLA, No xanthomas. Normocephalic and atraumatic, oropharynx without edema or exudate.  Lungs: Resp regular and unlabored, CTA. Heart: Ir Ir  no s3, s4, or murmurs..   Neck: No carotid bruits. No lymphadenopathy.  No JVD. Abdomen: Bowel sounds present, abdomen soft and non-tender without masses or hernias noted. Msk:  No spine or cva tenderness. No weakness, no joint deformities or effusions. Extremities: No clubbing, cyanosis or edema. DP/PT/Radials 2+ and equal bilaterally. Neuro:  No focal deficits noted. Somewhat confused Psych:  flat Skin: No rashes or lesions noted.  Labs:   Lab Results  Component Value Date   WBC 5.0 11/08/2015   HGB 10.5* 11/08/2015   HCT 33.2* 11/08/2015   MCV 93.3 11/08/2015    PLT 61* 11/08/2015    Recent Labs  11/08/15 0500  INR 2.80*     Recent Labs Lab 11/08/15 0500  NA 136  K 4.6  CL 97*  CO2 22  BUN 47*  CREATININE 7.26*  CALCIUM 9.6  PROT 6.8  BILITOT 0.9  ALKPHOS 396*  ALT 92*  AST 606*  GLUCOSE 135*  ALBUMIN 1.8*   MAGNESIUM  Date Value Ref Range Status  11/01/2015 2.1 1.7 - 2.4 mg/dL Final   No results for input(s): CKTOTAL, CKMB, TROPONINI in the last 72 hours. No results for input(s): TROPIPOC in the last 72 hours. PRO B NATRIURETIC PEPTIDE (BNP)  Date/Time Value Ref Range Status  09/03/2014 01:37 PM 125.0* 0.0 - 100.0 pg/mL Final  12/13/2010 12:57 PM 1307.0* 0.0 - 100.0 pg/mL Final   Lab Results  Component Value Date   CHOL 123 01/19/2015   HDL 47.40 01/19/2015   LDLCALC 60 01/19/2015   TRIG 80.0 01/19/2015   No results found for: DDIMER No results found for: LIPASE, AMYLASE TSH  Date/Time Value Ref Range Status  10/22/2015 04:03 AM 3.128 0.350 - 4.500 uIU/mL Final  01/19/2015 09:35 AM 0.13* 0.35 - 4.50 uIU/mL Final   VITAMIN B-12  Date/Time Value Ref Range Status  10/22/2015 04:03 AM 284 180 - 914 pg/mL Final    Comment:    (NOTE) This assay is not validated for testing neonatal or myeloproliferative syndrome specimens for Vitamin B12 levels.    FOLATE  Date/Time Value Ref Range Status  12/13/2010 12:30 PM  ng/mL Final   4.3 (NOTE)  Reference Ranges        Deficient:       0.4 - 3.3 ng/mL        Indeterminate:   3.4 - 5.4 ng/mL        Normal:              > 5.4 ng/mL   FERRITIN  Date/Time Value Ref Range Status  12/13/2010 12:30 PM 373* 22 -  322 ng/mL Final   TIBC  Date/Time Value Ref Range Status  09/06/2015 03:48 AM 266 250 - 450 ug/dL Final   IRON  Date/Time Value Ref Range Status  09/06/2015 03:48 AM 16* 45 - 182 ug/dL Final    Echo: 06/23/10 LV EF: 40% -  45%  ------------------------------------------------------------------- Indications:   Atrial fibrillation -  427.31.  ------------------------------------------------------------------- History:  PMH: Former Smoker. ESRD. Atrial fibrillation. Atrial fibrillation. Risk factors: Hypertension. Diabetes mellitus. Dyslipidemia.  ------------------------------------------------------------------- Study Conclusions  - Left ventricle: The cavity size was mildly dilated. Wall thickness was normal. Systolic function was mildly to moderately reduced. The estimated ejection fraction was in the range of 40% to 45%. There is hypokinesis of the anteroseptal myocardium. Doppler parameters are consistent with high ventricular filling pressure. - Mitral valve: There was moderate regurgitation. - Left atrium: The atrium was moderately dilated. - Right ventricle: The cavity size was mildly dilated. - Right atrium: The atrium was moderately dilated. - Tricuspid valve: There was severe regurgitation. - Pulmonary arteries: Systolic pressure was mildly increased. PA peak pressure: 39 mm Hg (S). - Line: A venous catheter was visualized in the right atrium.  Impressions:  - Hypokinesis of the anteroseptal wall with overall mild to moderate LV dysfunction; elevated LV filling pressure; biatrial enlargement; mild RVE; moderate MR; severe TR with mildly elevated pulmonary pressure.  ECG: Vent. rate 135 BPM PR interval * ms QRS duration 105 ms QT/QTc 342/513 ms P-R-T axes -1 108 49  Radiology:  Mr Brain Wo Contrast  11/07/2015  CLINICAL DATA:  Initial evaluation for acute encephalopathy. EXAM: MRI HEAD WITHOUT CONTRAST TECHNIQUE: Multiplanar, multiecho pulse sequences of the brain and surrounding structures were obtained without intravenous contrast. COMPARISON:  Prior MRI from 07/20/2015. FINDINGS: Study degraded by motion artifact. Diffuse prominence of the CSF containing spaces compatible with generalized age-related cerebral atrophy. Confluent T2/FLAIR hyperintensity within the  periventricular white matter most consistent with mild chronic small vessel ischemic disease. No abnormal foci of restricted diffusion to suggest acute intracranial infarct. Gray-white matter differentiation maintained. Major intracranial vascular flow voids grossly preserved. No acute or chronic intracranial hemorrhage. No mass lesion, midline shift, or mass effect. No hydrocephalus. No extra-axial fluid collection. Craniocervical junction within normal limits. Mild degenerative spondylolysis present within the upper cervical spine without stenosis. Pituitary gland within normal limits. No acute abnormality about the orbits. Mild mucosal thickening within the left maxillary sinus. Paranasal sinuses are otherwise largely clear. Trace opacity within the left mastoid air cells. Right mastoid air cells clear. Inner ear structures within normal limits. Bone marrow signal intensity within normal limits. No scalp soft tissue abnormality. IMPRESSION: 1. No acute intracranial infarct or other process identified. 2. Age-related cerebral atrophy with mild chronic small vessel ischemic disease, stable. Electronically Signed   By: Rise Mu M.D.   On: 11/07/2015 00:13    ASSESSMENT AND PLAN:    Principal Problem:   Sepsis (HCC) Active Problems:   Type II diabetes mellitus with renal manifestations (HCC)   Essential hypertension   ESRD on dialysis (HCC)   Pressure ulcer   FUO (fever of unknown origin)   Transaminasemia   Acute encephalopathy   Atrial fibrillation (HCC)   Chronic diastolic Congestive heart failure (HCC)   Plan: The patient with complex medical problems as noted above. Admitted with sepsis of unclear etiology. Cardiology is consulted for possible RHC. Seems HD is option for volume management. CHADsvasc 8. Continue Coumadin per pharmacy, currently on hold for arthrocentesis of left nee.  Continue  carvedilol for rate control. MD to see.   SignedManson Passey, PA 11/08/2015,  3:07 PM   Co-Sign MD As above, patient seen and examined. Briefly he is a 80 year old male with past medical history of end-stage renal disease, coronary artery disease status post previous PCI, atrial fibrillation, hypertension, diabetes mellitus for evaluation of congestive heart failure and pulmonary hypertension. Last echocardiogram January 2017Showed ejection fraction 40-45%, elevated left ventricular filling pressures, moderate mitral regurgitation, moderate left atrial enlargement, right-sided enlargement, severe tricuspid regurgitation and mildly elevated pulmonary pressures. Patient was discharged on January 9 following admission for pneumonia/sepsis. Patient was treated with antibiotics with improvement. Patient again admitted on January 15 with altered mental status and failure to thrive. He has been treated with Antibiotics.Cultures have been negative other than urine culture with greater than 100,000 colonies of yeast. Evaluation has also included right upper quadrant ultrasound which revealed gallbladder wall thickening consistent with chronic cholecystitis versus right-sided CHF. HIDA scan negative. Ammonia 48 and patient treated with lactulose. Other laboratories include SGOT 606, SGPT 92, alkaline phosphatase 396. Platelet count 61. INR 2.80 despite being treated with vitamin K.  Liver failure is felt secondary to passive congestion/congestive heart failure and cardiology is asked to evaluate. Given age and comorbidities our options are limited. We will continue carvedilol for rate control of atrial fibrillation. Coumadin is on hold and INR remains greater than 2. We cannot diurese as patient does not make urine. Volume management per dialysis. I do not think we should pursue inotropes or other aggressive therapies. Given liver failure I agree with holding medications that could potentially exacerbate this including statin. His prognosis is poor and I discussed this with his family.  Olga Millers

## 2015-11-08 NOTE — Progress Notes (Signed)
PROGRESS NOTE  [name] [name] ZOX:[id number] DOB: 08/22/1936 DOA: 10/30/2015 PCP: [name], MD Admit HPI / Brief Narrative: 80 y.o. M Hx CAD, HTN, HLD, CHF, ESRD dialized M/W/F, hypothyroidism, DM2, and a HCAP admission w/ discharge 1/9 on fortaz and vanc with dialysis (last dose 1/13) who was brought in by family with confusion, and decreased appetitie for a couple of days. In the ED he was febrile to 102.6. He was given zosyn, vanc, and IVF initially. Abx changed to vanc and Merrem. His mental status continued to fluctuate. The patient continued to have intermittent fevers. All his cultures were negative. His antibiotics were subsequent stopped and the patient was monitored clinically. Assessment/Plan: Sepsis of unclear etiology/FUO -Has a hemodialysis catheter which could potentially be the source, continue IV vancomycin, meropenem, fluconazole  urine culture with greater than 100,000 colonies of yeast-->unclear clinical significance -Follow-up blood cultures--neg to date -CT abd/pelvis--right splenic cyst, no abscess, no colonic inflammatory changes, cholelithiasis without cholecystitis -CT chest--right hilar and right paratracheal lymph node; loculated pleural effusion right oblique fissure without consolidation -MRI left knee irregular meniscal tear of the posterior horn with moderately complex joint effusion -RUQ ultrasound--diffuse gallbladder wall thickening--Rside CHF vs chronic cholecystitis -HIDA scan--neg -d/c vanco and merrem and monitor clinically -11/08/15--arthrocentesis--cell count not indicative of septic joint -intermittent fevers likely to gouty arthritis-->start prednisone  metabolic encephalopathy in the setting of above/?underlying cognitive impairment/Hyerammonemia -multifactorial due to R-heart failure-->hepatic insufficiency-->hyperammonemia -ammonia--48-->41-->21 -continue lactulose -MR brain--negative for acute stroke or other acute  findings  Left Knee pain -check uric acid--5.9 -MR left knee--moderate effusion--likely gout -discussed with IR, Dr. Konrad [name] to attempt arthrocentesis if warfarin is reversed -1/19--Vitamin K 10 mg IV 1-->  -11/05/15--repeat vitamin K 10mg  IV x 1-- -11/06/15--repeat vitamin K (3rd dose) -11/07/15--repeat vitamin K ESRD on HD M/W/F HD fistula placed 09/03/15 in L UE w/ good thrill to palpation -HD catheter and right chest, follow-up blood cultures, HD per renal -appreciate nephrology Coagulopathy- -secondary to chronic hepatic congestion from R-heart failure  -difficult to correct even after 4 doses of vitamin K Transaminasemia -Likely due to hepatic congestion from chronic right heart failure -Discontinue fluconazole and Crestor -RUQ ultrasound with GB thickening -HIDA--neg for cystic duct or CBD obstruction -given difficulty in reversing INR, 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 Chronic systolic CHF  -Volume management per HD  -10/24/15--Echo EF 40-45%, severe TR -Restart carvedilol lower dose -appreciate cardiology consult--nothing additional will change his poor prognosis Chronic Afib w/ acute RVR  CHADS-VASc 8 - on chronic warfarin - rate controlled  -10/24/15--Echo EF 40-45%, severe TR -Restart carvedilol-->increase dose to 6.25 mg twice a day -rate controlled -start heparin as INR now <2  Elevated troponin - CAD s/p PCI RCA  Trop peaked at 0.56 - most c/w demand ischemia/rate related - no chest pain   DM2 -Hemoglobin A1c--5.9 -Patient normally takes glipizide at home -[employer] sliding scale  HTN BP currently well controlled/soft -continue carvedilol   Hypothyroidism s/p thyroidectomy  Cont home synthroid dose   Anemia of CKD -B12 marginally low -supplement  Stage 1 sacral wound -not infected on exam -consult wound care -present at time of admission  Code Status: DNR--discussed with [name] Family  Communication: updated son [name] on phone 1/22--total time 35 min Disposition Plan: SNF, pending workup  Consultants: Nephrology   Procedures: 1/8 TTE - EF 40-45% - hypokinesis anteroseptal myocardium - mild mitral valve regurg - LA mildly dilated - RA mod  dilated - severe TR - PA pressure 39mm Hg  Antibiotics: Zosyn (prior to admit)  Vanc (prior to admit) >1/18 Meropenem 1/15 >1/21   Family Communication: Sister updated at beside 1/21 Disposition Plan: SNF when medically stable     Procedures/Studies: Dg Chest 2 View  10/24/2015  CLINICAL DATA:  Nonproductive cough, hypoxemia. EXAM: CHEST  2 VIEW COMPARISON:  Chest x-rays dated 10/20/2015 and 09/04/2015. FINDINGS: Fairly severe cardiomegaly is unchanged. Right chest wall dual lumen catheter is stable in position with tip projected over the right atrium. Central pulmonary vascular congestion and mild bilateral interstitial edema is unchanged, suspected to be chronic. New confluent airspace opacity is seen at the right lung base. IMPRESSION: 1. New confluent airspace opacity at the right lung base, pneumonia versus aspiration. 2. Stable cardiomegaly. The mild central pulmonary vascular congestion and bilateral interstitial edema is not significantly changed, most likely related to a chronic mild CHF. Electronically Signed   By: Bary Richard M.D.   On: 10/24/2015 11:10   Dg Chest 2 View  10/20/2015  CLINICAL DATA:  Acute onset of altered mental status. Tachycardia. Initial encounter. EXAM: CHEST  2 VIEW COMPARISON:  Chest radiograph performed 09/04/2015 FINDINGS: The lungs are well-aerated. Vascular congestion is noted. A small left pleural effusion is suspected. Increased interstitial markings likely reflect pulmonary edema. There is no evidence of pneumothorax. The heart is enlarged. No acute osseous abnormalities are seen. A right-sided dual-lumen catheter is noted ending about the cavoatrial junction. IMPRESSION: Vascular congestion  and cardiomegaly. Small left pleural effusion suspected. Increased interstitial markings likely reflect pulmonary edema. Electronically Signed   By: [name] M.D.   On: 10/20/2015 22:00   Ct Head Wo Contrast  10/20/2015  CLINICAL DATA:  Altered mental status EXAM: CT HEAD WITHOUT CONTRAST TECHNIQUE: Contiguous axial images were obtained from the base of the skull through the vertex without intravenous contrast. COMPARISON:  Head CT July 19, 2015; brain MRI July 20, 2015 FINDINGS: There is stable age related volume loss. There is no intracranial mass, hemorrhage, extra-axial fluid collection, or midline shift. There is stable slight small vessel disease in the centra semiovale bilaterally. Elsewhere gray-white compartments are normal. No acute infarct is evident. Calcification along the falx is stable. Bony calvarium appears intact. The mastoid air cells are clear. No intraorbital lesions are identified. IMPRESSION: Slight periventricular small vessel disease, stable. No intracranial mass, hemorrhage, or new gray - white compartment lesions/acute appearing infarct. Stable appearance compared to prior studies. Electronically Signed   By: Bretta Bang III M.D.   On: 10/20/2015 21:26   Ct Chest W Contrast  11/04/2015  CLINICAL DATA:  Fever of unknown origin. History of coronary disease hypertension, congestive heart failure chronic renal disease. EXAM: CT CHEST, ABDOMEN, AND PELVIS WITH CONTRAST TECHNIQUE: Multidetector CT imaging of the chest, abdomen and pelvis was performed following the standard protocol during bolus administration of intravenous contrast. CONTRAST:  OMNIPAQUE IOHEXOL 300 MG/ML  SOLN COMPARISON:  Chest radiograph 10/24/2015, 10/20/2015 on CT thorax 03/12/2004 FINDINGS: CT CHEST FINDINGS Mediastinum/Nodes: No axillary or supraclavicular adenopathy. The paratracheal adenopathy is present. Example 16 mm short axis RIGHT lower paratracheal lymph node which is increased from 12  mm on prior. RIGHT hilar lymph node measures 20 mm on image 25 series 201 increased from 18 mm. AP window node measures 9 mm compared to 11 mm. No central pulmonary embolism. Borderline enlarged LEFT supraclavicular lymph node measures 11 mm (image 5, series 201) Cardiomegaly noted. Lungs/Pleura: There is interlobular septal  thickening in the lungs. Extensive centrilobular emphysema. There is loculated fluid collections along the RIGHT oblique fissure. No consolidation to suggest active pulmonary infection. Musculoskeletal: No aggressive osseous lesion. CT ABDOMEN AND PELVIS FINDINGS Hepatobiliary: There is reflux from the injection bolus into the hepatic veins consistent with RIGHT heart insufficiency. No focal hepatic lesion. Several gallstones collect within the decompressed gallbladder. No biliary duct dilatation. Pancreas: Pancreas is normal. No ductal dilatation. No pancreatic inflammation. Spleen: Low-attenuation lesion within the spleen is rounded measuring 3.8 cm. This is new from 2005. Adrenals/urinary tract: Adrenal glands are normal. Poor renal cortical enhancement. High-density lesion measuring 8 mm in the RIGHT renal cortex likely represents hemorrhagic cyst. Ureters and bladder normal. The bladder is collapsed Stomach/Bowel: Stomach, small bowel and cecum are normal. The appendix is not identified. Small amount fluid inferior to the cecal tip (image 99 of the axial series). Colon rectosigmoid colon are normal. No evidence of colonic inflammation or infection Vascular/Lymphatic: Abdominal aorta is normal caliber with atherosclerotic calcification. There is no retroperitoneal or periportal lymphadenopathy. No pelvic lymphadenopathy. Reproductive: Prostate normal Other: No abscess in the abdomen pelvis. Musculoskeletal: No aggressive osseous lesion. Gas in the L4-L5 disc space likely relates to degenerative change. There is significant arthropathy at the facet joints at this level. IMPRESSION: Chest  Impression: 1. Progressive mediastinal and hilar lymphadenopathy from CT 2005. This may relate to congestive heart failure. Cannot exclude lymphoproliferative process such as CLL. No significant adenopathy in the abdomen or axilla would favor against lymphoma. 2. Cardiomegaly and loculated pleural effusions with interlobular septal thickening likely relates to congestive heart failure. RIGHT heart insufficiency noted with contrast reflux into hepatic veins. 3. Centrilobular emphysema. 4. No evidence of acute pulmonary infection. Abdomen / Pelvis Impression: 1. Large round lesion within the spleen is new from 2005. In patient with fever of unknown origin cannot exclude a splenic abscess. No evidence of enhancement with favor benign cyst. Consider MRI of the abdomen with contrast. Of note, if patient cannot hold breath then MRI may not be a suitable imaging modality. In that case, consider FDG PET scan for evaluation if clinical suspicion warrants. 2. No evidence of abscess or inflammation the peritoneal space. 3. Small amount of free fluid along the RIGHT pericolic gutter likely relates to heart failure described above. 4. Cholelithiasis without evidence acute cholecystitis. Electronically Signed   By: Genevive Bi M.D.   On: 11/04/2015 08:29   Mr Brain Wo Contrast  11/07/2015  CLINICAL DATA:  Initial evaluation for acute encephalopathy. EXAM: MRI HEAD WITHOUT CONTRAST TECHNIQUE: Multiplanar, multiecho pulse sequences of the brain and surrounding structures were obtained without intravenous contrast. COMPARISON:  Prior MRI from 07/20/2015. FINDINGS: Study degraded by motion artifact. Diffuse prominence of the CSF containing spaces compatible with generalized age-related cerebral atrophy. Confluent T2/FLAIR hyperintensity within the periventricular white matter most consistent with mild chronic small vessel ischemic disease. No abnormal foci of restricted diffusion to suggest acute intracranial infarct.  Gray-white matter differentiation maintained. Major intracranial vascular flow voids grossly preserved. No acute or chronic intracranial hemorrhage. No mass lesion, midline shift, or mass effect. No hydrocephalus. No extra-axial fluid collection. Craniocervical junction within normal limits. Mild degenerative spondylolysis present within the upper cervical spine without stenosis. Pituitary gland within normal limits. No acute abnormality about the orbits. Mild mucosal thickening within the left maxillary sinus. Paranasal sinuses are otherwise largely clear. Trace opacity within the left mastoid air cells. Right mastoid air cells clear. Inner ear structures within normal limits. Bone marrow signal intensity  within normal limits. No scalp soft tissue abnormality. IMPRESSION: 1. No acute intracranial infarct or other process identified. 2. Age-related cerebral atrophy with mild chronic small vessel ischemic disease, stable. Electronically Signed   By: Rise Mu M.D.   On: 11/07/2015 00:13   Ct Abdomen Pelvis W Contrast  11/04/2015  CLINICAL DATA:  Fever of unknown origin. History of coronary disease hypertension, congestive heart failure chronic renal disease. EXAM: CT CHEST, ABDOMEN, AND PELVIS WITH CONTRAST TECHNIQUE: Multidetector CT imaging of the chest, abdomen and pelvis was performed following the standard protocol during bolus administration of intravenous contrast. CONTRAST:  OMNIPAQUE IOHEXOL 300 MG/ML  SOLN COMPARISON:  Chest radiograph 10/24/2015, 10/20/2015 on CT thorax 03/12/2004 FINDINGS: CT CHEST FINDINGS Mediastinum/Nodes: No axillary or supraclavicular adenopathy. The paratracheal adenopathy is present. Example 16 mm short axis RIGHT lower paratracheal lymph node which is increased from 12 mm on prior. RIGHT hilar lymph node measures 20 mm on image 25 series 201 increased from 18 mm. AP window node measures 9 mm compared to 11 mm. No central pulmonary embolism. Borderline enlarged  LEFT supraclavicular lymph node measures 11 mm (image 5, series 201) Cardiomegaly noted. Lungs/Pleura: There is interlobular septal thickening in the lungs. Extensive centrilobular emphysema. There is loculated fluid collections along the RIGHT oblique fissure. No consolidation to suggest active pulmonary infection. Musculoskeletal: No aggressive osseous lesion. CT ABDOMEN AND PELVIS FINDINGS Hepatobiliary: There is reflux from the injection bolus into the hepatic veins consistent with RIGHT heart insufficiency. No focal hepatic lesion. Several gallstones collect within the decompressed gallbladder. No biliary duct dilatation. Pancreas: Pancreas is normal. No ductal dilatation. No pancreatic inflammation. Spleen: Low-attenuation lesion within the spleen is rounded measuring 3.8 cm. This is new from 2005. Adrenals/urinary tract: Adrenal glands are normal. Poor renal cortical enhancement. High-density lesion measuring 8 mm in the RIGHT renal cortex likely represents hemorrhagic cyst. Ureters and bladder normal. The bladder is collapsed Stomach/Bowel: Stomach, small bowel and cecum are normal. The appendix is not identified. Small amount fluid inferior to the cecal tip (image 99 of the axial series). Colon rectosigmoid colon are normal. No evidence of colonic inflammation or infection Vascular/Lymphatic: Abdominal aorta is normal caliber with atherosclerotic calcification. There is no retroperitoneal or periportal lymphadenopathy. No pelvic lymphadenopathy. Reproductive: Prostate normal Other: No abscess in the abdomen pelvis. Musculoskeletal: No aggressive osseous lesion. Gas in the L4-L5 disc space likely relates to degenerative change. There is significant arthropathy at the facet joints at this level. IMPRESSION: Chest Impression: 1. Progressive mediastinal and hilar lymphadenopathy from CT 2005. This may relate to congestive heart failure. Cannot exclude lymphoproliferative process such as CLL. No significant  adenopathy in the abdomen or axilla would favor against lymphoma. 2. Cardiomegaly and loculated pleural effusions with interlobular septal thickening likely relates to congestive heart failure. RIGHT heart insufficiency noted with contrast reflux into hepatic veins. 3. Centrilobular emphysema. 4. No evidence of acute pulmonary infection. Abdomen / Pelvis Impression: 1. Large round lesion within the spleen is new from 2005. In patient with fever of unknown origin cannot exclude a splenic abscess. No evidence of enhancement with favor benign cyst. Consider MRI of the abdomen with contrast. Of note, if patient cannot hold breath then MRI may not be a suitable imaging modality. In that case, consider FDG PET scan for evaluation if clinical suspicion warrants. 2. No evidence of abscess or inflammation the peritoneal space. 3. Small amount of free fluid along the RIGHT pericolic gutter likely relates to heart failure described above. 4. Cholelithiasis  without evidence acute cholecystitis. Electronically Signed   By: Genevive Bi M.D.   On: 11/04/2015 08:29   Mr Knee Left  Wo Contrast  11/03/2015  CLINICAL DATA:  80 year old in patient on dialysis with left-greater-than-right knee pain. Fever of unknown origin. EXAM: MRI OF THE LEFT KNEE WITHOUT CONTRAST TECHNIQUE: Multiplanar, multisequence MR imaging of the knee was performed. No intravenous contrast was administered. COMPARISON:  None. FINDINGS: Study is mildly motion degraded. MENISCI Medial meniscus: Meniscal degeneration with irregular radial tear in the posterior horn. The meniscal root is intact. No centrally displaced meniscal fragment. Lateral meniscus:  Meniscal degeneration without discrete tear. LIGAMENTS Cruciates:  Intact with mild mucoid degeneration. Collaterals: Intact. There is a small amount of medial bursal fluid. The popliteus tendon is thickened near its femoral insertion. CARTILAGE Patellofemoral:  Adequately preserved. Medial: Mild chondral  thinning and surface irregularity. There is mild subchondral edema and intraosseous ganglion formation throughout the medial tibial plateau. Lateral:  Adequately preserved. OTHER Joint: Moderate size complex joint effusion consistent with synovitis. No discrete intra-articular loose bodies observed. Popliteal Fossa:  Small mildly complex Baker's cyst. Extensor Mechanism: The distal quadriceps tendon appears normal. There is patellar tendinosis with a long segment of tubular T2 hyperintensity in the medial aspect of the tendon Bones: There are several intraosseous ganglia in the proximal tibia, largest posteriorly in the medial tibial plateau, measuring up to 1.6 cm. There is no evidence of periarticular bone marrow edema or osteomyelitis. IMPRESSION: 1. Degenerative radial tear of the posterior horn of the medial meniscus. 2. Lateral meniscal degeneration without evidence of tear. 3. Complex joint effusion consistent with synovitis. No specific evidence of infection or osteomyelitis. Correlate clinically. 4. Intra tendinous cyst formation medially in the patellar tendon, likely a ganglion cyst. This could indicate underlying interstitial tendon tearing. Electronically Signed   By: Carey Bullocks M.D.   On: 11/03/2015 19:59   Nm Hepato W/eject Fract  11/05/2015  CLINICAL DATA:  Gallbladder wall thickening. Multiple stones. Elevated LFTs. EXAM: NUCLEAR MEDICINE HEPATOBILIARY IMAGING TECHNIQUE: Sequential images of the abdomen were obtained out to 60 minutes following intravenous administration of radiopharmaceutical. RADIOPHARMACEUTICALS:  5.2 mCi Tc-27m  Choletec IV COMPARISON:  11/04/2015 ultrasound FINDINGS: There is prompt uptake and excretion of radiotracer by the liver. Activity is seen in the gallbladder by 30 minutes and small bowel by 55 minutes. IMPRESSION: No evidence of cystic duct or common bile duct obstruction. Electronically Signed   By: Charlett Nose M.D.   On: 11/05/2015 15:29   Dg Chest Port 1  View  10/30/2015  CLINICAL DATA:  Acute onset of shortness of breath. Initial encounter. EXAM: PORTABLE CHEST 1 VIEW COMPARISON:  Chest radiograph performed 10/24/2015 FINDINGS: The patient's right-sided dual-lumen catheter is noted ending overlying the right atrium. The lungs are well-aerated. Vascular congestion is noted. Increased interstitial markings raise concern for mild interstitial edema. A small left pleural effusion may be present. No pneumothorax is seen. The cardiomediastinal silhouette is mildly enlarged. No acute osseous abnormalities are seen. IMPRESSION: Vascular congestion and mild cardiomegaly. Increased interstitial markings raise concern for mild interstitial edema. Small left pleural effusion may be present. Electronically Signed   By: [name] M.D.   On: 10/30/2015 23:54   Dg Fluoro Guided Needle Plc Aspiration/injection Loc  11/08/2015  CLINICAL DATA:  80 year old male with left knee joint effusion, fever of unknown origin. Request for knee aspiration and culture. Dialysis patient. Initial encounter. EXAM: LEFT KNEE JOINT ASPIRATION UNDER FLUOROSCOPY COMPARISON:  Left knee MRI 11/03/2015  FLUOROSCOPY TIME:  Radiation Exposure Index (as provided by the fluoroscopic device): If the device does not provide the exposure index: Fluoroscopy Time (in minutes and seconds):  0 minutes 6 seconds Number of Acquired Images:  None PROCEDURE: Informed consent obtained from the patient son after discussion of risks and benefits. A "time-out" was performed. Suitable skin entry site along the medial aspect of the patella at the left knee was identified. The area was prepped with Betadine, draped in the usual sterile fashion, and infiltrated locally with buffered Lidocaine. Short 20 gauge needle was advanced into the joint space, and 10-12 mL of amber colored fluid was easily aspirated. This was sent to the lab for analysis. Diagnostic injection then of a small volume of Omnipaque 300 demonstrates  intra-articular spread. The needle was withdrawn, direct pressure held and hemostasis noted. The patient tolerated the procedure well, and was returned to the ward in stable condition for continued care. IMPRESSION: Technically successful left knee joint aspiration with fluoroscopic assistance. 10-12 mL of amber color fluid was aspirated and sent to the lab for analysis. Electronically Signed   By: Odessa Fleming M.D.   On: 11/08/2015 15:40   US Abdomen Limited Ruq  11/04/2015  CLINICAL DATA:  Elevated liver function tests. EXAM: US ABDOMEN LIMITED - RIGHT UPPER QUADRANT COMPARISON:  Chest, abdomen and pelvis CT obtained earlier today. FINDINGS: Gallbladder: Diffuse gallbladder wall thickening measuring 7 mm in thickness. Multiple small echogenic foci adherent to the walls of the gallbladder without shadowing. Small amount of corresponding calcific density on the recent CT. Mild pericholecystic fluid. No tenderness over the gallbladder today. Common bile duct: Diameter: 2.0 mm Liver: Dilated hepatic veins. To and fro flow within the portal vein. Minimal ascites. IMPRESSION: 1. Diffuse gallbladder wall thickening. This could be due to right heart failure or chronic cholecystitis. 2. Multiple tiny, adherent stones in the gallbladder. 3. Dilated hepatic veins and to and fro flow in the portal vein, compatible with hepatic congestion due to right heart failure. 4. Minimal ascites and mild pericholecystic fluid. Electronically Signed   By: Beckie Salts M.D.   On: 11/04/2015 16:30         Subjective: Patient is presently confused. He denies any fevers, chills, chest pain, shortness breath, nausea, vomiting, diarrhea, abdominal pain. No dysuria or hematuria. No rashes.  Objective: Filed Vitals:   11/08/15 1200 11/08/15 1229 11/08/15 1254 11/08/15 1738  BP: 123/61 109/62 120/55 133/59  Pulse: 87 81 76 81  Temp:  97.2 F (36.2 C) 97.8 F (36.6 C) 97.2 F (36.2 C)  TempSrc:  Oral Oral Oral  Resp:  18 18 18     Height:      Weight:  71.1 kg (156 lb 12 oz)    SpO2:  94% 99% 99%    Intake/Output Summary (Last 24 hours) at 11/08/15 1807 Last data filed at 11/08/15 1739  Gross per 24 hour  Intake    300 ml  Output      3 ml  Net    297 ml   Weight change:  Exam:   General:  Pt is alert, follows commands appropriately, not in acute distress  HEENT: No icterus, No thrush, No neck mass, Solomon/AT  Cardiovascular: IRRR, S1/S2, no rubs, no gallops  Respiratory: CTA bilaterally, no wheezing, no crackles, no rhonchi  Abdomen: Soft/+BS, non tender, non distended, no guarding  Extremities: trace LE edema, No lymphangitis, No petechiae, No rashes, no synovitis  Data Reviewed: Basic Metabolic Panel:  Recent Labs  Lab 11/03/15 0900 11/04/15 0554 11/05/15 0526 11/06/15 0655 11/07/15 0800 11/08/15 0500  NA  --  135 133* 136 135 136  K  --  4.8 5.0 4.5 5.4* 4.6  CL  --  100* 97* 100* 96* 97*  CO2  --  24 21* 24 23 22   GLUCOSE  --  101* 128* 165* 128* 135*  BUN  --  33* 52* 24* 36* 47*  CREATININE  --  5.32* 6.93* 4.43* 6.19* 7.26*  CALCIUM  --  8.9 9.1 9.0 9.3 9.6  PHOS 4.0  --   --   --   --   --    Liver Function Tests:  Recent Labs Lab 11/04/15 0554 11/05/15 0526 11/06/15 0655 11/07/15 0800 11/08/15 0500  AST 484* 715* 563* 625* 606*  ALT 66* 94* 91* 99* 92*  ALKPHOS 192* 247* 292* 324* 396*  BILITOT 0.9 0.8 0.7 1.6* 0.9  PROT 7.1 6.8 6.9 6.8 6.8  ALBUMIN 1.9* 1.9* 1.8* 1.9* 1.8*   No results for input(s): LIPASE, AMYLASE in the last 168 hours.  Recent Labs Lab 11/04/15 1355 11/06/15 0655 11/07/15 0920 11/08/15 0830  AMMONIA 48* 41* 147* 21   CBC:  Recent Labs Lab 11/02/15 0644  11/04/15 0554 11/05/15 0526 11/05/15 1924 11/06/15 0655 11/07/15 0800 11/08/15 0500  WBC 5.4  < > 6.8 6.3 4.6 5.0 4.8 5.0  NEUTROABS 3.0  --  3.8 3.9  --  2.9 3.0  --   HGB 10.6*  < > 10.9* 10.9* 10.8* 11.2* 11.2* 10.5*  HCT 34.9*  < > 34.7* 33.9* 35.3* 36.1* 36.0* 33.2*  MCV  95.4  < > 94.3 91.6 93.4 94.0 93.5 93.3  PLT 122*  < > 131* 104* 125* 82* 94* 61*  < > = values in this interval not displayed. Cardiac Enzymes: No results for input(s): CKTOTAL, CKMB, CKMBINDEX, TROPONINI in the last 168 hours. BNP: Invalid input(s): POCBNP CBG:  Recent Labs Lab 11/07/15 1705 11/07/15 2112 11/08/15 0726 11/08/15 1251 11/08/15 1713  GLUCAP 140* 140* 134* 77 123*    Recent Results (from the past 240 hour(s))  Blood Culture (routine x 2)     Status: None   Collection Time: 10/31/15 12:45 AM  Result Value Ref Range Status   Specimen Description BLOOD RIGHT HAND  Final   Special Requests IN PEDIATRIC BOTTLE 3CC  Final   Culture NO GROWTH 5 DAYS  Final   Report Status 11/05/2015 FINAL  Final  Blood Culture (routine x 2)     Status: None   Collection Time: 10/31/15 12:49 AM  Result Value Ref Range Status   Specimen Description BLOOD LEFT HAND  Final   Special Requests IN PEDIATRIC BOTTLE 4CC  Final   Culture NO GROWTH 5 DAYS  Final   Report Status 11/05/2015 FINAL  Final  Urine culture     Status: None   Collection Time: 10/31/15  2:02 AM  Result Value Ref Range Status   Specimen Description URINE, CATHETERIZED  Final   Special Requests NONE  Final   Culture >=100,000 COLONIES/mL YEAST  Final   Report Status 11/01/2015 FINAL  Final  Gram stain     Status: None   Collection Time: 11/08/15  3:21 PM  Result Value Ref Range Status   Specimen Description FLUID SYNOVIAL LEFT KNEE  Final   Special Requests NONE  Final   Gram Stain   Final    MODERATE WBC PRESENT, PREDOMINANTLY PMN NO ORGANISMS SEEN  Report Status 11/08/2015 FINAL  Final     Scheduled Meds: . calcium acetate  667 mg Oral TID WC  . carvedilol  6.25 mg Oral BID WC  . vitamin B-12  500 mcg Oral Daily  . darbepoetin (ARANESP) injection - DIALYSIS  40 mcg Intravenous Q Fri-HD  . doxercalciferol  1 mcg Intravenous Q M,W,F-HD  . feeding supplement (NEPRO CARB STEADY)  237 mL Oral BID BM  .  insulin aspart  0-9 Units Subcutaneous TID WC  . lactulose  20 g Oral TID  . levothyroxine  175 mcg Oral QAC breakfast  . lidocaine (PF)      . multivitamin  1 tablet Oral Daily  . sodium chloride  3 mL Intravenous Q12H  . sodium chloride       Continuous Infusions:    Demarquez Ciolek, DO  [facility] Pager [phone]  If 7PM-7AM, please contact night-coverage [email or web address] Password [email or web address] 11/08/2015, 6:07 PM   LOS: 8 days

## 2015-11-09 ENCOUNTER — Ambulatory Visit: Payer: Medicare Other | Admitting: Family Medicine

## 2015-11-09 LAB — GLUCOSE, CAPILLARY
GLUCOSE-CAPILLARY: 137 mg/dL — AB (ref 65–99)
GLUCOSE-CAPILLARY: 168 mg/dL — AB (ref 65–99)
Glucose-Capillary: 134 mg/dL — ABNORMAL HIGH (ref 65–99)
Glucose-Capillary: 239 mg/dL — ABNORMAL HIGH (ref 65–99)

## 2015-11-09 LAB — PROTIME-INR
INR: 1.61 — ABNORMAL HIGH (ref 0.00–1.49)
Prothrombin Time: 19.2 seconds — ABNORMAL HIGH (ref 11.6–15.2)

## 2015-11-09 MED ORDER — COLLAGENASE 250 UNIT/GM EX OINT
TOPICAL_OINTMENT | Freq: Every day | CUTANEOUS | Status: DC
  Administered 2015-11-09 – 2015-11-11 (×3): via TOPICAL
  Filled 2015-11-09: qty 30

## 2015-11-09 MED ORDER — WARFARIN SODIUM 1 MG PO TABS
1.0000 mg | ORAL_TABLET | Freq: Once | ORAL | Status: AC
Start: 2015-11-09 — End: 2015-11-09
  Administered 2015-11-09: 1 mg via ORAL
  Filled 2015-11-09: qty 1

## 2015-11-09 MED ORDER — PREDNISONE 50 MG PO TABS
50.0000 mg | ORAL_TABLET | Freq: Every day | ORAL | Status: DC
  Administered 2015-11-09 – 2015-11-10 (×2): 50 mg via ORAL
  Filled 2015-11-09 (×2): qty 1

## 2015-11-09 MED ORDER — PREDNISONE 10 MG PO TABS: 10.0000 mg | ORAL_TABLET | Freq: Every day | ORAL | Status: DC

## 2015-11-09 MED ORDER — WARFARIN - PHYSICIAN DOSING INPATIENT: Freq: Every day | Status: DC

## 2015-11-09 NOTE — Progress Notes (Signed)
    Subjective:  Denies CP or dyspnea; more alert   Objective:  Filed Vitals:   11/08/15 1738 11/08/15 1944 11/09/15 0438 11/09/15 0748  BP: 133/59 94/64 95/69  104/61  Pulse: 81 76 82 85  Temp: 97.2 F (36.2 C) 98.6 F (37 C) 98.5 F (36.9 C) 98.6 F (37 C)  TempSrc: Oral Oral Oral Oral  Resp: Height:      Weight:  156 lb 8.4 oz (71 kg)    SpO2: 99% 98% 97% 93%    Intake/Output from previous day:  Intake/Output Summary (Last 24 hours) at 11/09/15 1216 Last data filed at 11/09/15 1004  Gross per 24 hour  Intake    540 ml  Output      2 ml  Net    538 ml    Physical Exam: Physical exam: Well-developed frail in no acute distress.  Skin is warm and dry.  HEENT is normal.  Neck is supple.  Chest is clear to auscultation with normal expansion.  Cardiovascular exam is irregular Abdominal exam nontender or distended. No masses palpated. Extremities show no edema. neuro grossly intact    Lab Results: Basic Metabolic Panel:  Recent Labs  95/62/13 0800 11/08/15 0500  NA 135 136  K 5.4* 4.6  CL 96* 97*  CO2 23 22  GLUCOSE 128* 135*  BUN 36* 47*  CREATININE 6.19* 7.26*  CALCIUM 9.3 9.6   CBC:  Recent Labs  11/07/15 0800 11/08/15 0500  WBC 4.8 5.0  NEUTROABS 3.0  --   HGB 11.2* 10.5*  HCT 36.0* 33.2*  MCV 93.5 93.3  PLT 94* 61*     Assessment/Plan:  1 Atrial fibrillation-continue carvedilol for rate control. Coumadin is on hold given coagulopathy. 2 right-sided heart failure-volume control per nephrology/dialysis. Given age and multiple comorbidities options are limited. 3 end-stage renal disease-dialysis per nephrology. 4 Metabolic encephalopathy-improving. Continue lactulose. Cardiology will sign off. Please call with questions. Olga Millers 11/09/2015, 12:16 PM

## 2015-11-09 NOTE — Progress Notes (Addendum)
Physical Therapy Treatment Patient Details Name: [name] [name] MRN: [id number] DOB: [birth year] Today's Date: 11/09/2015    History of Present Illness 80 y.o. M Hx CAD, HTN, HLD, CHF, ESRD dialized M/W/F, hypothyroidism, DM2, and a [facility] admission w/ discharge 1/9 on fortaz and vanc with dialysis (last dose 1/13) who was brought in by family with confusion, and decreased appetitie for a couple of days. In the ED he was febrile to 102.6. L knee pain and swelling, MRI showing meniscal tear, fluid taken off joint, and L knee feels better (as of 1/24), noting pain in R knee as well    PT Comments    Noting very good improvements in functional mobility, activity tolerance, bed mobility, and sitting balance compared to last session; continue to agree with SNF for rehab;   Briefly discussed plans with son and wife, who indicate that with worsening heart failure and liver function, they are at a point to consider End of Life considerations;   We are happy to work with [name] [name] and his family, and support them in any way we can; Also, if PT is not congruent with his Goals of Care, we will sign off.  Follow Up Recommendations  SNF     Equipment Recommendations  None recommended by PT    Recommendations for Other Services       Precautions / Restrictions Precautions Precautions: Fall    Mobility  Bed Mobility Overal bed mobility: Needs Assistance Bed Mobility: Rolling;Sidelying to Sit Rolling: Max assist Sidelying to sit: Mod assist       General bed mobility comments: Step-by-step and at times hand-over-hand cues for technique; mod asist to elevate trunk from sidelying to sit; noted good initiation of pushing up  Transfers Overall transfer level: Needs assistance Equipment used:  (bed pad) Transfers: Lateral/Scoot Transfers          Lateral/Scoot Transfers: Mod assist;+2 safety/equipment (second person helpful to steady chair) General transfer comment: multimodal cues  for technqiue; inefficient scooting and required bil (gentle) knee block for stability; laterally scooted to chair placed on pt's R side with armrest down  Ambulation/Gait                 Stairs            Wheelchair Mobility    Modified Rankin (Stroke Patients Only)       Balance     Sitting balance-[name] [name] Scale: Fair (approaching good) Sitting balance - Comments: Sat EOB to perform ADLs, including bimanual tasks                            Cognition Arousal/Alertness: Awake/alert Behavior During Therapy: WFL for tasks assessed/performed Overall Cognitive Status: Impaired/Different from baseline         Following Commands: Follows one step commands with increased time       General Comments: Mentation and interactions improving; able to communicate wants and needs during session    Exercises      General Comments        Pertinent Vitals/Pain Pain Assessment: Faces Faces Pain Scale: Hurts even more Pain Location: bil knees with moving and weight bearing Pain Descriptors / Indicators: Aching;Grimacing Pain Intervention(s): Limited activity within patient's tolerance;Monitored during session    Home Living Family/patient expects to be discharged to:: Skilled nursing facility                    Prior Function  PT Goals (current goals can now be found in the care plan section) Acute Rehab PT Goals Patient Stated Goal: did not state PT Goal Formulation: Patient unable to participate in goal setting Time For Goal Achievement: 11/15/15 Potential to Achieve Goals: Fair Progress towards PT goals: Progressing toward goals    Frequency  Min 2X/week    PT Plan Current plan remains appropriate    Co-evaluation  So-session with OT for pt and therapist safety, also to accomodate activity tolerance; PT focused on mobility, balance and transfers           End of Session Equipment Utilized During Treatment: Gait  belt Activity Tolerance: Patient limited by fatigue Patient left: in chair;with call bell/phone within reach;with chair alarm set     Time: 1610-9604 PT Time Calculation (min) (ACUTE ONLY): 30 min  Charges:  $Therapeutic Activity: 8-22 mins                    G Codes:      [name] [name] 11/09/2015, 12:47 PM  [name] [name], [name] [name]  Acute Rehabilitation Services Pager [phone] Office [phone]

## 2015-11-09 NOTE — Progress Notes (Signed)
Occupational Therapy Treatment Patient Details Name: [name] [name] MRN: [id number] DOB: 05/02/1936 Today's Date: 11/09/2015    History of present illness 80 y.o. M Hx CAD, HTN, HLD, CHF, ESRD dialized M/W/F, hypothyroidism, DM2, and a [facility] admission w/ discharge 1/9 on fortaz and vanc with dialysis (last dose 1/13) who was brought in by family with confusion, and decreased appetitie for a couple of days. In the ED he was febrile to 102.6. L knee pain and swelling, MRI showing meniscal tear, fluid taken off joint, and L knee feels better (as of 1/24), noting pain in R knee as well   OT comments  Pt with great improvement from last OT session. Will continue to follow acutely.   Follow Up Recommendations  SNF;Supervision/Assistance - 24 hour    Equipment Recommendations  Other (comment) (defer to next venue)    Recommendations for Other Services      Precautions / Restrictions Precautions Precautions: Fall Restrictions Weight Bearing Restrictions: No       Mobility Bed Mobility Overal bed mobility: Needs Assistance Bed Mobility: Rolling;Sidelying to Sit Rolling: Max assist Sidelying to sit: Mod assist       General bed mobility comments: Step-by-step and at times hand-over-hand cues for technique; mod asist to elevate trunk from sidelying to sit; noted good initiation of pushing up  Transfers Overall transfer level: Needs assistance Equipment used:  (bed pad) Transfers: Lateral/Scoot Transfers          Lateral/Scoot Transfers: Mod assist;+2 safety/equipment (second person helpful to steady chair) General transfer comment: multimodal cues for technqiue; inefficient scooting and required bil (gentle) knee block for stability; laterally scooted to chair placed on pt's R side with armrest down    Balance     Sitting balance-Leahy Scale: Fair (approaching good) Sitting balance - Comments: Sat EOB to perform ADLs, including bimanual tasks                            ADL Overall ADL's : Needs assistance/impaired     Grooming: Wash/dry face;Oral care;Set up;Supervision/safety;Sitting;Applying deodorant (EOB)                   Toilet Transfer: +2 for safety/equipment;Moderate assistance (lateral scoot from bed to chair)           Functional mobility during ADLs: Moderate assistance;+2 for safety/equipment (lateral scoot) General ADL Comments: Pt sat EOB and performed grooming tasks.      Vision                     Perception     Praxis      Cognition  Awake/Alert Behavior During Therapy: WFL for tasks assessed/performed Overall Cognitive Status:  (unsure of baseline) Area of Impairment: Following commands;Problem solving        Following Commands: Follows one step commands with increased time     Problem Solving: Slow processing     Extremity/Trunk Assessment               Exercises     Shoulder Instructions       General Comments      Pertinent Vitals/ Pain       Pain Assessment: Faces Pain Score: 6  Faces Pain Scale: Hurts even more Pain Location: bilateral knees with moving and weightbearing Pain Descriptors / Indicators: Grimacing Pain Intervention(s): Monitored during session;Repositioned;Limited activity within patient's tolerance  Home Living  Prior Functioning/Environment              Frequency Min 2X/week     Progress Toward Goals  OT Goals(current goals can now be found in the care plan section)  Progress towards OT goals: Progressing toward goals-updated some goals due to progress  Acute Rehab OT Goals Patient Stated Goal: not stated OT Goal Formulation: With family Time For Goal Achievement: 11/16/15 Potential to Achieve Goals: Good   Plan Discharge plan remains appropriate    Co-evaluation    PT/OT/SLP Co-Evaluation/Treatment: Yes Reason for Co-Treatment: For patient/therapist safety   OT goals  addressed during session: ADL's and self-care;Other (comment) (mobility)      End of Session Equipment Utilized During Treatment: Gait belt   Activity Tolerance Patient limited by fatigue   Patient Left in chair;with call bell/phone within reach;with chair alarm set (nurse tech in room)   Nurse Communication  (spoke to tech about transfer)        Time: 4540-9811 OT Time Calculation (min): 29 min  Charges: OT General Charges $OT Visit: 1 Procedure OT Treatments $Self Care/Home Management : 8-22 mins  Earlie Raveling OTR/L [phone] 11/09/2015, 12:58 PM

## 2015-11-09 NOTE — Progress Notes (Signed)
PROGRESS NOTE  [name] [name] WUJ:[id number] DOB: Dec 02, 1935 DOA: 10/30/2015 PCP: [name], MD Admit HPI / Brief Narrative: 80 y.o. M Hx CAD, HTN, HLD, CHF, ESRD dialized M/W/F, hypothyroidism, DM2, and a HCAP admission w/ discharge 1/9 on fortaz and vanc with dialysis (last dose 1/13) who was brought in by family with confusion, and decreased appetite for a couple of days. In the ED, he was febrile to 102.6. He was given zosyn, vanc, and IVF initially. Abx changed to vanc and Merrem. His mental status continued to fluctuate. The patient continued to have intermittent fevers. FUO work up without infection source, likely due to gouty arthritis flare. All his cultures were negative. His antibiotics were subsequent stopped and the patient was monitored clinically. Assessment/Plan: Sepsis of unclear etiology/FUO -Has a hemodialysis catheter which could potentially be the source, continue IV vancomycin, meropenem, fluconazole  urine culture with greater than 100,000 colonies of yeast-->unclear clinical significance -Follow-up blood cultures--neg to date -CT abd/pelvis--right splenic cyst, no abscess, no colonic inflammatory changes, cholelithiasis without cholecystitis -CT chest--right hilar and right paratracheal lymph node; loculated pleural effusion right oblique fissure without consolidation -MRI left knee irregular meniscal tear of the posterior horn with moderately complex joint effusion -RUQ ultrasound--diffuse gallbladder wall thickening--Rside CHF vs chronic cholecystitis -HIDA scan--neg -d/c vanco and merrem and monitor clinically -11/08/15--arthrocentesis--cell count not indicative of septic joint -intermittent fevers likely to gouty arthritis-->start prednisone  metabolic encephalopathy in the setting of above/?underlying cognitive impairment/Hyerammonemia -multifactorial due to R-heart failure-->hepatic  insufficiency-->hyperammonemia -ammonia--48-->41-->21 -continue lactulose -MR brain--negative for acute stroke or other acute findings  Coagulopathy -secondary to chronic hepatic congestion from R-heart failure  -difficult to correct even after 4 doses of vitamin K -11/08/14 INR--1.61 -give coumadin 1 mg tonight  Transaminasemia -Due to hepatic congestion from chronic right heart failure -Discontinued fluconazole and Crestor -RUQ ultrasound with GB thickening -HIDA--neg for cystic duct or CBD obstruction -given difficulty in reversing INR, 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 (due to R-heart failure)  Left > R Knee pain-->gouty arthritis -check uric acid--5.9 -MR left knee--moderate effusion--likely gout -discussed with IR, Dr. Konrad [name] to attempt arthrocentesis if warfarin is reversed -11/08/15 arthrocentesis--positive monosodium urate crystals, WBC not consistent with septic joint  ESRD on HD M/W/F HD fistula placed 09/03/15 in L UE w/ good thrill to palpation -HD catheter and right chest, follow-up blood cultures, HD per renal -appreciate nephrology  Chronic systolic CHF /R-heart failure -Volume management per HD  -10/24/15--Echo EF 40-45%, severe TR -Restart carvedilol lower dose -appreciate cardiology consult--nothing additional will change his poor prognosis  Chronic Afib w/ acute RVR  CHADS-VASc 8 - on chronic warfarin - rate controlled  -10/24/15--Echo EF 40-45%, severe TR -Restart carvedilol-->increase dose to 6.25 mg twice a day -rate controlled   Elevated troponin - CAD s/p PCI RCA  Trop peaked at 0.56 - most c/w demand ischemia/rate related - no chest pain   DM2 -Hemoglobin A1c--5.9 -Patient normally takes glipizide at home -[employer] sliding scale  HTN BP currently well controlled/soft -continue carvedilol  Hypothyroidism s/p thyroidectomy  Cont home synthroid dose   Anemia of CKD -B12 marginally  low -supplement  Stage 1 sacral wound -not infected on exam -consult wound care -present at time of admission  GOALS OF CARE -1/24-Palliative medicine consulted -family wants to recheck labs am 1/25-->if LFTs remain elevated and mental status no better-->they would consider total comfort care (would want [facility])  Code Status: DNR--discussed  with [name] and son-in-law [name] at bedside Family Communication: updated sons on 1/24 Disposition Plan: SNF vs residential hospice    Consultants: Nephrology  Cardiology Palliative medicine  Procedures: 1/8 TTE - EF 40-45% - hypokinesis anteroseptal myocardium - mild mitral valve regurg - LA mildly dilated - RA mod dilated - severe TR - PA pressure 39mm Hg  Antibiotics: Zosyn (prior to admit)  Vanc (prior to admit) >1/18 Meropenem 1/15 >1/21    Procedures/Studies: Dg Chest 2 View  10/24/2015  CLINICAL DATA:  Nonproductive cough, hypoxemia. EXAM: CHEST  2 VIEW COMPARISON:  Chest x-rays dated 10/20/2015 and 09/04/2015. FINDINGS: Fairly severe cardiomegaly is unchanged. Right chest wall dual lumen catheter is stable in position with tip projected over the right atrium. Central pulmonary vascular congestion and mild bilateral interstitial edema is unchanged, suspected to be chronic. New confluent airspace opacity is seen at the right lung base. IMPRESSION: 1. New confluent airspace opacity at the right lung base, pneumonia versus aspiration. 2. Stable cardiomegaly. The mild central pulmonary vascular congestion and bilateral interstitial edema is not significantly changed, most likely related to a chronic mild CHF. Electronically Signed   By: Bary Richard M.D.   On: 10/24/2015 11:10   Dg Chest 2 View  10/20/2015  CLINICAL DATA:  Acute onset of altered mental status. Tachycardia. Initial encounter. EXAM: CHEST  2 VIEW COMPARISON:  Chest radiograph performed 09/04/2015 FINDINGS: The lungs are well-aerated. Vascular congestion is noted. A  small left pleural effusion is suspected. Increased interstitial markings likely reflect pulmonary edema. There is no evidence of pneumothorax. The heart is enlarged. No acute osseous abnormalities are seen. A right-sided dual-lumen catheter is noted ending about the cavoatrial junction. IMPRESSION: Vascular congestion and cardiomegaly. Small left pleural effusion suspected. Increased interstitial markings likely reflect pulmonary edema. Electronically Signed   By: [name] M.D.   On: 10/20/2015 22:00   Ct Head Wo Contrast  10/20/2015  CLINICAL DATA:  Altered mental status EXAM: CT HEAD WITHOUT CONTRAST TECHNIQUE: Contiguous axial images were obtained from the base of the skull through the vertex without intravenous contrast. COMPARISON:  Head CT July 19, 2015; brain MRI July 20, 2015 FINDINGS: There is stable age related volume loss. There is no intracranial mass, hemorrhage, extra-axial fluid collection, or midline shift. There is stable slight small vessel disease in the centra semiovale bilaterally. Elsewhere gray-white compartments are normal. No acute infarct is evident. Calcification along the falx is stable. Bony calvarium appears intact. The mastoid air cells are clear. No intraorbital lesions are identified. IMPRESSION: Slight periventricular small vessel disease, stable. No intracranial mass, hemorrhage, or new gray - white compartment lesions/acute appearing infarct. Stable appearance compared to prior studies. Electronically Signed   By: Bretta Bang III M.D.   On: 10/20/2015 21:26   Ct Chest W Contrast  11/04/2015  CLINICAL DATA:  Fever of unknown origin. History of coronary disease hypertension, congestive heart failure chronic renal disease. EXAM: CT CHEST, ABDOMEN, AND PELVIS WITH CONTRAST TECHNIQUE: Multidetector CT imaging of the chest, abdomen and pelvis was performed following the standard protocol during bolus administration of intravenous contrast. CONTRAST:   OMNIPAQUE IOHEXOL 300 MG/ML  SOLN COMPARISON:  Chest radiograph 10/24/2015, 10/20/2015 on CT thorax 03/12/2004 FINDINGS: CT CHEST FINDINGS Mediastinum/Nodes: No axillary or supraclavicular adenopathy. The paratracheal adenopathy is present. Example 16 mm short axis RIGHT lower paratracheal lymph node which is increased from 12 mm on prior. RIGHT hilar lymph node measures 20 mm on image 25 series 201 increased from 18  mm. AP window node measures 9 mm compared to 11 mm. No central pulmonary embolism. Borderline enlarged LEFT supraclavicular lymph node measures 11 mm (image 5, series 201) Cardiomegaly noted. Lungs/Pleura: There is interlobular septal thickening in the lungs. Extensive centrilobular emphysema. There is loculated fluid collections along the RIGHT oblique fissure. No consolidation to suggest active pulmonary infection. Musculoskeletal: No aggressive osseous lesion. CT ABDOMEN AND PELVIS FINDINGS Hepatobiliary: There is reflux from the injection bolus into the hepatic veins consistent with RIGHT heart insufficiency. No focal hepatic lesion. Several gallstones collect within the decompressed gallbladder. No biliary duct dilatation. Pancreas: Pancreas is normal. No ductal dilatation. No pancreatic inflammation. Spleen: Low-attenuation lesion within the spleen is rounded measuring 3.8 cm. This is new from 2005. Adrenals/urinary tract: Adrenal glands are normal. Poor renal cortical enhancement. High-density lesion measuring 8 mm in the RIGHT renal cortex likely represents hemorrhagic cyst. Ureters and bladder normal. The bladder is collapsed Stomach/Bowel: Stomach, small bowel and cecum are normal. The appendix is not identified. Small amount fluid inferior to the cecal tip (image 99 of the axial series). Colon rectosigmoid colon are normal. No evidence of colonic inflammation or infection Vascular/Lymphatic: Abdominal aorta is normal caliber with atherosclerotic calcification. There is no retroperitoneal or  periportal lymphadenopathy. No pelvic lymphadenopathy. Reproductive: Prostate normal Other: No abscess in the abdomen pelvis. Musculoskeletal: No aggressive osseous lesion. Gas in the L4-L5 disc space likely relates to degenerative change. There is significant arthropathy at the facet joints at this level. IMPRESSION: Chest Impression: 1. Progressive mediastinal and hilar lymphadenopathy from CT 2005. This may relate to congestive heart failure. Cannot exclude lymphoproliferative process such as CLL. No significant adenopathy in the abdomen or axilla would favor against lymphoma. 2. Cardiomegaly and loculated pleural effusions with interlobular septal thickening likely relates to congestive heart failure. RIGHT heart insufficiency noted with contrast reflux into hepatic veins. 3. Centrilobular emphysema. 4. No evidence of acute pulmonary infection. Abdomen / Pelvis Impression: 1. Large round lesion within the spleen is new from 2005. In patient with fever of unknown origin cannot exclude a splenic abscess. No evidence of enhancement with favor benign cyst. Consider MRI of the abdomen with contrast. Of note, if patient cannot hold breath then MRI may not be a suitable imaging modality. In that case, consider FDG PET scan for evaluation if clinical suspicion warrants. 2. No evidence of abscess or inflammation the peritoneal space. 3. Small amount of free fluid along the RIGHT pericolic gutter likely relates to heart failure described above. 4. Cholelithiasis without evidence acute cholecystitis. Electronically Signed   By: Genevive Bi M.D.   On: 11/04/2015 08:29   Mr Brain Wo Contrast  11/07/2015  CLINICAL DATA:  Initial evaluation for acute encephalopathy. EXAM: MRI HEAD WITHOUT CONTRAST TECHNIQUE: Multiplanar, multiecho pulse sequences of the brain and surrounding structures were obtained without intravenous contrast. COMPARISON:  Prior MRI from 07/20/2015. FINDINGS: Study degraded by motion artifact.  Diffuse prominence of the CSF containing spaces compatible with generalized age-related cerebral atrophy. Confluent T2/FLAIR hyperintensity within the periventricular white matter most consistent with mild chronic small vessel ischemic disease. No abnormal foci of restricted diffusion to suggest acute intracranial infarct. Gray-white matter differentiation maintained. Major intracranial vascular flow voids grossly preserved. No acute or chronic intracranial hemorrhage. No mass lesion, midline shift, or mass effect. No hydrocephalus. No extra-axial fluid collection. Craniocervical junction within normal limits. Mild degenerative spondylolysis present within the upper cervical spine without stenosis. Pituitary gland within normal limits. No acute abnormality about the orbits. Mild mucosal  thickening within the left maxillary sinus. Paranasal sinuses are otherwise largely clear. Trace opacity within the left mastoid air cells. Right mastoid air cells clear. Inner ear structures within normal limits. Bone marrow signal intensity within normal limits. No scalp soft tissue abnormality. IMPRESSION: 1. No acute intracranial infarct or other process identified. 2. Age-related cerebral atrophy with mild chronic small vessel ischemic disease, stable. Electronically Signed   By: Rise Mu M.D.   On: 11/07/2015 00:13   Ct Abdomen Pelvis W Contrast  11/04/2015  CLINICAL DATA:  Fever of unknown origin. History of coronary disease hypertension, congestive heart failure chronic renal disease. EXAM: CT CHEST, ABDOMEN, AND PELVIS WITH CONTRAST TECHNIQUE: Multidetector CT imaging of the chest, abdomen and pelvis was performed following the standard protocol during bolus administration of intravenous contrast. CONTRAST:  OMNIPAQUE IOHEXOL 300 MG/ML  SOLN COMPARISON:  Chest radiograph 10/24/2015, 10/20/2015 on CT thorax 03/12/2004 FINDINGS: CT CHEST FINDINGS Mediastinum/Nodes: No axillary or supraclavicular  adenopathy. The paratracheal adenopathy is present. Example 16 mm short axis RIGHT lower paratracheal lymph node which is increased from 12 mm on prior. RIGHT hilar lymph node measures 20 mm on image 25 series 201 increased from 18 mm. AP window node measures 9 mm compared to 11 mm. No central pulmonary embolism. Borderline enlarged LEFT supraclavicular lymph node measures 11 mm (image 5, series 201) Cardiomegaly noted. Lungs/Pleura: There is interlobular septal thickening in the lungs. Extensive centrilobular emphysema. There is loculated fluid collections along the RIGHT oblique fissure. No consolidation to suggest active pulmonary infection. Musculoskeletal: No aggressive osseous lesion. CT ABDOMEN AND PELVIS FINDINGS Hepatobiliary: There is reflux from the injection bolus into the hepatic veins consistent with RIGHT heart insufficiency. No focal hepatic lesion. Several gallstones collect within the decompressed gallbladder. No biliary duct dilatation. Pancreas: Pancreas is normal. No ductal dilatation. No pancreatic inflammation. Spleen: Low-attenuation lesion within the spleen is rounded measuring 3.8 cm. This is new from 2005. Adrenals/urinary tract: Adrenal glands are normal. Poor renal cortical enhancement. High-density lesion measuring 8 mm in the RIGHT renal cortex likely represents hemorrhagic cyst. Ureters and bladder normal. The bladder is collapsed Stomach/Bowel: Stomach, small bowel and cecum are normal. The appendix is not identified. Small amount fluid inferior to the cecal tip (image 99 of the axial series). Colon rectosigmoid colon are normal. No evidence of colonic inflammation or infection Vascular/Lymphatic: Abdominal aorta is normal caliber with atherosclerotic calcification. There is no retroperitoneal or periportal lymphadenopathy. No pelvic lymphadenopathy. Reproductive: Prostate normal Other: No abscess in the abdomen pelvis. Musculoskeletal: No aggressive osseous lesion. Gas in the L4-L5  disc space likely relates to degenerative change. There is significant arthropathy at the facet joints at this level. IMPRESSION: Chest Impression: 1. Progressive mediastinal and hilar lymphadenopathy from CT 2005. This may relate to congestive heart failure. Cannot exclude lymphoproliferative process such as CLL. No significant adenopathy in the abdomen or axilla would favor against lymphoma. 2. Cardiomegaly and loculated pleural effusions with interlobular septal thickening likely relates to congestive heart failure. RIGHT heart insufficiency noted with contrast reflux into hepatic veins. 3. Centrilobular emphysema. 4. No evidence of acute pulmonary infection. Abdomen / Pelvis Impression: 1. Large round lesion within the spleen is new from 2005. In patient with fever of unknown origin cannot exclude a splenic abscess. No evidence of enhancement with favor benign cyst. Consider MRI of the abdomen with contrast. Of note, if patient cannot hold breath then MRI may not be a suitable imaging modality. In that case, consider FDG PET scan for  evaluation if clinical suspicion warrants. 2. No evidence of abscess or inflammation the peritoneal space. 3. Small amount of free fluid along the RIGHT pericolic gutter likely relates to heart failure described above. 4. Cholelithiasis without evidence acute cholecystitis. Electronically Signed   By: Genevive Bi M.D.   On: 11/04/2015 08:29   Mr Knee Left  Wo Contrast  11/03/2015  CLINICAL DATA:  80 year old in patient on dialysis with left-greater-than-right knee pain. Fever of unknown origin. EXAM: MRI OF THE LEFT KNEE WITHOUT CONTRAST TECHNIQUE: Multiplanar, multisequence MR imaging of the knee was performed. No intravenous contrast was administered. COMPARISON:  None. FINDINGS: Study is mildly motion degraded. MENISCI Medial meniscus: Meniscal degeneration with irregular radial tear in the posterior horn. The meniscal root is intact. No centrally displaced meniscal  fragment. Lateral meniscus:  Meniscal degeneration without discrete tear. LIGAMENTS Cruciates:  Intact with mild mucoid degeneration. Collaterals: Intact. There is a small amount of medial bursal fluid. The popliteus tendon is thickened near its femoral insertion. CARTILAGE Patellofemoral:  Adequately preserved. Medial: Mild chondral thinning and surface irregularity. There is mild subchondral edema and intraosseous ganglion formation throughout the medial tibial plateau. Lateral:  Adequately preserved. OTHER Joint: Moderate size complex joint effusion consistent with synovitis. No discrete intra-articular loose bodies observed. Popliteal Fossa:  Small mildly complex Baker's cyst. Extensor Mechanism: The distal quadriceps tendon appears normal. There is patellar tendinosis with a long segment of tubular T2 hyperintensity in the medial aspect of the tendon Bones: There are several intraosseous ganglia in the proximal tibia, largest posteriorly in the medial tibial plateau, measuring up to 1.6 cm. There is no evidence of periarticular bone marrow edema or osteomyelitis. IMPRESSION: 1. Degenerative radial tear of the posterior horn of the medial meniscus. 2. Lateral meniscal degeneration without evidence of tear. 3. Complex joint effusion consistent with synovitis. No specific evidence of infection or osteomyelitis. Correlate clinically. 4. Intra tendinous cyst formation medially in the patellar tendon, likely a ganglion cyst. This could indicate underlying interstitial tendon tearing. Electronically Signed   By: Carey Bullocks M.D.   On: 11/03/2015 19:59   Nm Hepato W/eject Fract  11/05/2015  CLINICAL DATA:  Gallbladder wall thickening. Multiple stones. Elevated LFTs. EXAM: NUCLEAR MEDICINE HEPATOBILIARY IMAGING TECHNIQUE: Sequential images of the abdomen were obtained out to 60 minutes following intravenous administration of radiopharmaceutical. RADIOPHARMACEUTICALS:  5.2 mCi Tc-72m  Choletec IV COMPARISON:   11/04/2015 ultrasound FINDINGS: There is prompt uptake and excretion of radiotracer by the liver. Activity is seen in the gallbladder by 30 minutes and small bowel by 55 minutes. IMPRESSION: No evidence of cystic duct or common bile duct obstruction. Electronically Signed   By: Charlett Nose M.D.   On: 11/05/2015 15:29   Dg Chest Port 1 View  10/30/2015  CLINICAL DATA:  Acute onset of shortness of breath. Initial encounter. EXAM: PORTABLE CHEST 1 VIEW COMPARISON:  Chest radiograph performed 10/24/2015 FINDINGS: The patient's right-sided dual-lumen catheter is noted ending overlying the right atrium. The lungs are well-aerated. Vascular congestion is noted. Increased interstitial markings raise concern for mild interstitial edema. A small left pleural effusion may be present. No pneumothorax is seen. The cardiomediastinal silhouette is mildly enlarged. No acute osseous abnormalities are seen. IMPRESSION: Vascular congestion and mild cardiomegaly. Increased interstitial markings raise concern for mild interstitial edema. Small left pleural effusion may be present. Electronically Signed   By: [name] M.D.   On: 10/30/2015 23:54   Dg Fluoro Guided Needle Plc Aspiration/injection Loc  11/08/2015  CLINICAL DATA:  80 year old male with left knee joint effusion, fever of unknown origin. Request for knee aspiration and culture. Dialysis patient. Initial encounter. EXAM: LEFT KNEE JOINT ASPIRATION UNDER FLUOROSCOPY COMPARISON:  Left knee MRI 11/03/2015 FLUOROSCOPY TIME:  Radiation Exposure Index (as provided by the fluoroscopic device): If the device does not provide the exposure index: Fluoroscopy Time (in minutes and seconds):  0 minutes 6 seconds Number of Acquired Images:  None PROCEDURE: Informed consent obtained from the patient son after discussion of risks and benefits. A "time-out" was performed. Suitable skin entry site along the medial aspect of the patella at the left knee was identified. The area was  prepped with Betadine, draped in the usual sterile fashion, and infiltrated locally with buffered Lidocaine. Short 20 gauge needle was advanced into the joint space, and 10-12 mL of amber colored fluid was easily aspirated. This was sent to the lab for analysis. Diagnostic injection then of a small volume of Omnipaque 300 demonstrates intra-articular spread. The needle was withdrawn, direct pressure held and hemostasis noted. The patient tolerated the procedure well, and was returned to the ward in stable condition for continued care. IMPRESSION: Technically successful left knee joint aspiration with fluoroscopic assistance. 10-12 mL of amber color fluid was aspirated and sent to the lab for analysis. Electronically Signed   By: Odessa Fleming M.D.   On: 11/08/2015 15:40   US Abdomen Limited Ruq  11/04/2015  CLINICAL DATA:  Elevated liver function tests. EXAM: US ABDOMEN LIMITED - RIGHT UPPER QUADRANT COMPARISON:  Chest, abdomen and pelvis CT obtained earlier today. FINDINGS: Gallbladder: Diffuse gallbladder wall thickening measuring 7 mm in thickness. Multiple small echogenic foci adherent to the walls of the gallbladder without shadowing. Small amount of corresponding calcific density on the recent CT. Mild pericholecystic fluid. No tenderness over the gallbladder today. Common bile duct: Diameter: 2.0 mm Liver: Dilated hepatic veins. To and fro flow within the portal vein. Minimal ascites. IMPRESSION: 1. Diffuse gallbladder wall thickening. This could be due to right heart failure or chronic cholecystitis. 2. Multiple tiny, adherent stones in the gallbladder. 3. Dilated hepatic veins and to and fro flow in the portal vein, compatible with hepatic congestion due to right heart failure. 4. Minimal ascites and mild pericholecystic fluid. Electronically Signed   By: Beckie Salts M.D.   On: 11/04/2015 16:30         Subjective: Patient is pleasantly confused. Denies any fevers, chills, chest pain, shortness  breath, abdominal pain, nausea, vomiting, diarrhea. Complains of some knee pain. Denies any headache or visual disturbance.  Objective: Filed Vitals:   11/08/15 1944 11/09/15 0438 11/09/15 0748 11/09/15 1610  BP: 94/64 95/69 104/61 99/58  Pulse: 76 82 85 75  Temp: 98.6 F (37 C) 98.5 F (36.9 C) 98.6 F (37 C) 98.3 F (36.8 C)  TempSrc: Oral Oral Oral Axillary  Resp: Height:      Weight: 71 kg (156 lb 8.4 oz)     SpO2: 98% 97% 93% 100%    Intake/Output Summary (Last 24 hours) at 11/09/15 1812 Last data filed at 11/09/15 1402  Gross per 24 hour  Intake    540 ml  Output      0 ml  Net    540 ml   Weight change:  Exam:   General:  Pt is alert, follows commands appropriately, not in acute distress  HEENT: No icterus, No thrush, No neck mass, [place]/AT  Cardiovascular: RRR, S1/S2, no rubs, no gallops  Respiratory: CTA bilaterally, no wheezing, no crackles, no rhonchi  Abdomen: Soft/+BS, non tender, non distended, no guarding; no hepatosplenomegaly  Extremities: No edema, No lymphangitis, No petechiae, No rashes, synovitis of the bilateral knees  Data Reviewed: Basic Metabolic Panel:  Recent Labs Lab 11/03/15 0900 11/04/15 0554 11/05/15 0526 11/06/15 0655 11/07/15 0800 11/08/15 0500  NA  --  135 133* 136 135 136  K  --  4.8 5.0 4.5 5.4* 4.6  CL  --  100* 97* 100* 96* 97*  CO2  --  24 21* 24 23 22   GLUCOSE  --  101* 128* 165* 128* 135*  BUN  --  33* 52* 24* 36* 47*  CREATININE  --  5.32* 6.93* 4.43* 6.19* 7.26*  CALCIUM  --  8.9 9.1 9.0 9.3 9.6  PHOS 4.0  --   --   --   --   --    Liver Function Tests:  Recent Labs Lab 11/04/15 0554 11/05/15 0526 11/06/15 0655 11/07/15 0800 11/08/15 0500  AST 484* 715* 563* 625* 606*  ALT 66* 94* 91* 99* 92*  ALKPHOS 192* 247* 292* 324* 396*  BILITOT 0.9 0.8 0.7 1.6* 0.9  PROT 7.1 6.8 6.9 6.8 6.8  ALBUMIN 1.9* 1.9* 1.8* 1.9* 1.8*   No results for input(s): LIPASE, AMYLASE in the last 168  hours.  Recent Labs Lab 11/04/15 1355 11/06/15 0655 11/07/15 0920 11/08/15 0830  AMMONIA 48* 41* 147* 21   CBC:  Recent Labs Lab 11/04/15 0554 11/05/15 0526 11/05/15 1924 11/06/15 0655 11/07/15 0800 11/08/15 0500  WBC 6.8 6.3 4.6 5.0 4.8 5.0  NEUTROABS 3.8 3.9  --  2.9 3.0  --   HGB 10.9* 10.9* 10.8* 11.2* 11.2* 10.5*  HCT 34.7* 33.9* 35.3* 36.1* 36.0* 33.2*  MCV 94.3 91.6 93.4 94.0 93.5 93.3  PLT 131* 104* 125* 82* 94* 61*   Cardiac Enzymes: No results for input(s): CKTOTAL, CKMB, CKMBINDEX, TROPONINI in the last 168 hours. BNP: Invalid input(s): POCBNP CBG:  Recent Labs Lab 11/08/15 1713 11/08/15 2139 11/09/15 0747 11/09/15 1134 11/09/15 1549  GLUCAP 123* 151* 134* 137* 168*    Recent Results (from the past 240 hour(s))  Blood Culture (routine x 2)     Status: None   Collection Time: 10/31/15 12:45 AM  Result Value Ref Range Status   Specimen Description BLOOD RIGHT HAND  Final   Special Requests IN PEDIATRIC BOTTLE 3CC  Final   Culture NO GROWTH 5 DAYS  Final   Report Status 11/05/2015 FINAL  Final  Blood Culture (routine x 2)     Status: None   Collection Time: 10/31/15 12:49 AM  Result Value Ref Range Status   Specimen Description BLOOD LEFT HAND  Final   Special Requests IN PEDIATRIC BOTTLE 4CC  Final   Culture NO GROWTH 5 DAYS  Final   Report Status 11/05/2015 FINAL  Final  Urine culture     Status: None   Collection Time: 10/31/15  2:02 AM  Result Value Ref Range Status   Specimen Description URINE, CATHETERIZED  Final   Special Requests NONE  Final   Culture >=100,000 COLONIES/mL YEAST  Final   Report Status 11/01/2015 FINAL  Final  Culture, body fluid-bottle     Status: None (Preliminary result)   Collection Time: 11/08/15  3:21 PM  Result Value Ref Range Status   Specimen Description FLUID SYNOVIAL LEFT KNEE  Final   Special Requests NONE  Final   Culture NO GROWTH < 24 HOURS  Final   Report Status PENDING  Incomplete  Gram stain      Status: None   Collection Time: 11/08/15  3:21 PM  Result Value Ref Range Status   Specimen Description FLUID SYNOVIAL LEFT KNEE  Final   Special Requests NONE  Final   Gram Stain   Final    MODERATE WBC PRESENT, PREDOMINANTLY PMN NO ORGANISMS SEEN    Report Status 11/08/2015 FINAL  Final     Scheduled Meds: . calcium acetate  667 mg Oral TID WC  . carvedilol  6.25 mg Oral BID WC  . collagenase   Topical Daily  . vitamin B-12  500 mcg Oral Daily  . darbepoetin (ARANESP) injection - DIALYSIS  40 mcg Intravenous Q Fri-HD  . doxercalciferol  1 mcg Intravenous Q M,W,F-HD  . feeding supplement (NEPRO CARB STEADY)  237 mL Oral BID BM  . insulin aspart  0-9 Units Subcutaneous TID WC  . lactulose  20 g Oral TID  . levothyroxine  175 mcg Oral QAC breakfast  . multivitamin  1 tablet Oral Daily  . predniSONE  50 mg Oral Q breakfast  . sodium chloride  3 mL Intravenous Q12H   Continuous Infusions:    Rabiah Goeser, DO  [facility] Pager [phone]  If 7PM-7AM, please contact night-coverage [email or web address] Password [email or web address] 11/09/2015, 6:12 PM   LOS: 9 days

## 2015-11-09 NOTE — Care Management Important Message (Signed)
Important Message  Patient Details  Name: [name] [name]: [id number] Date of Birth: Oct 22, 1935   Medicare Important Message Given:  Yes    Denesha Brouse P Harley Fitzwater 11/09/2015, 3:38 PM

## 2015-11-09 NOTE — Progress Notes (Signed)
Subjective:  Awake, no c/o  Objective Vital signs in last 24 hours: Filed Vitals:   11/08/15 1738 11/08/15 1944 11/09/15 0438 11/09/15 0748  BP: 133/59 94/64 95/69  104/61  Pulse: 81 76 82 85  Temp: 97.2 F (36.2 C) 98.6 F (37 C) 98.5 F (36.9 C) 98.6 F (37 C)  TempSrc: Oral Oral Oral Oral  Resp: Height:      Weight:  71 kg (156 lb 8.4 oz)    SpO2: 99% 98% 97% 93%   Weight change:    Physical Exam General: awake, slow to answer, not oriented to place/ year this am Heart: Vent stable 80s and . Irreg. No rub, mur or gal. Lungs: bilateral CTA Abdomen: soft nontender active bs  Extremities: no  Pedal edema Dialysis Access: Maturing LUA AVF, R IJ  perm cath. patent on HD   Dialysis: [facility] 4h 400/800 72.5kg 2/2.25 bath R IJ cath/ LUA AVF maturing Hep 1500 Hect 1 ug tiw   Assessment: 1. Fevers- +urine cx grew fungus, is s/p 3 d of diflucan, all other abx stopped and remains afeb. Blood cxs negative 2. ESRD - MWF HD on schedule started HD Nov 2016 3. Anemia - HGB 11.2. On ESA. Follow HGB.  4. Secondary hyperparathyroidism - P 4 - on one phoslo ac; resumed Hectorol 1 ac will hold with correct Ca >10 5. HTN/volume - NO UF today /  at dry wt, BP's on low side. ON  Home coreg/ dilt on hold. 6. Nutrition - renal diet/vitamin/nepro alb 1.9 7. Hepatic failure - uncorrectable INR/ low plts/ encephalopathy, etc. Poor prognosis, no specific Rx  8. DM - off meds - per primary 9. Dementia - MR negative, TME related to #7 10. Chronic afib -no coumadin 11. Hypothyroidism -TSH 3.1 1/6 12. Thrombocytopenia- follow -  13. LVEF 40-45% by echo 1/8, LA/RA dilated, severe PA pressure elevation 39 mmHg  Plan - HD Wednesday, min UF   [name] MD [facility] pager [phone]    cell [phone] 11/09/2015, 9:00 AM     Labs: Basic Metabolic Panel:  Recent Labs Lab 11/03/15 0900  11/06/15 0655 11/07/15 0800 11/08/15 0500  NA  --   < >  136 135 136  K  --   < > 4.5 5.4* 4.6  CL  --   < > 100* 96* 97*  CO2  --   < > GLUCOSE  --   < > 165* 128* 135*  BUN  --   < > 24* 36* 47*  CREATININE  --   < > 4.43* 6.19* 7.26*  CALCIUM  --   < > 9.0 9.3 9.6  PHOS 4.0  --   --   --   --   < > = values in this interval not displayed. Liver Function Tests:  Recent Labs Lab 11/06/15 0655 11/07/15 0800 11/08/15 0500  AST 563* 625* 606*  ALT 91* 99* 92*  ALKPHOS 292* 324* 396*  BILITOT 0.7 1.6* 0.9  PROT 6.9 6.8 6.8  ALBUMIN 1.8* 1.9* 1.8*   No results for input(s): LIPASE, AMYLASE in the last 168 hours.  Recent Labs Lab 11/06/15 0655 11/07/15 0920 11/08/15 0830  AMMONIA 41* 147* 21   CBC:  Recent Labs Lab 11/05/15 0526 11/05/15 1924 11/06/15 0655 11/07/15 0800 11/08/15 0500  WBC 6.3 4.6 5.0 4.8 5.0  NEUTROABS 3.9  --  2.9 3.0  --   HGB 10.9* 10.8*  11.2* 11.2* 10.5*  HCT 33.9* 35.3* 36.1* 36.0* 33.2*  MCV 91.6 93.4 94.0 93.5 93.3  PLT 104* 125* 82* 94* 61*   Cardiac Enzymes: No results for input(s): CKTOTAL, CKMB, CKMBINDEX, TROPONINI in the last 168 hours. CBG:  Recent Labs Lab 11/08/15 0726 11/08/15 1251 11/08/15 1713 11/08/15 2139 11/09/15 0747  GLUCAP 134* 77 123* 151* 134*    Studies/Results: Dg Fluoro Guided Needle Plc Aspiration/injection Loc  11/08/2015  CLINICAL DATA:  80 year old male with left knee joint effusion, fever of unknown origin. Request for knee aspiration and culture. Dialysis patient. Initial encounter. EXAM: LEFT KNEE JOINT ASPIRATION UNDER FLUOROSCOPY COMPARISON:  Left knee MRI 11/03/2015 FLUOROSCOPY TIME:  Radiation Exposure Index (as provided by the fluoroscopic device): If the device does not provide the exposure index: Fluoroscopy Time (in minutes and seconds):  0 minutes 6 seconds Number of Acquired Images:  None PROCEDURE: Informed consent obtained from the patient son after discussion of risks and benefits. A "time-out" was performed. Suitable skin entry site  along the medial aspect of the patella at the left knee was identified. The area was prepped with Betadine, draped in the usual sterile fashion, and infiltrated locally with buffered Lidocaine. Short 20 gauge needle was advanced into the joint space, and 10-12 mL of amber colored fluid was easily aspirated. This was sent to the lab for analysis. Diagnostic injection then of a small volume of Omnipaque 300 demonstrates intra-articular spread. The needle was withdrawn, direct pressure held and hemostasis noted. The patient tolerated the procedure well, and was returned to the ward in stable condition for continued care. IMPRESSION: Technically successful left knee joint aspiration with fluoroscopic assistance. 10-12 mL of amber color fluid was aspirated and sent to the lab for analysis. Electronically Signed   By: Odessa Fleming M.D.   On: 11/08/2015 15:40   Medications:   . calcium acetate  667 mg Oral TID WC  . carvedilol  6.25 mg Oral BID WC  . vitamin B-12  500 mcg Oral Daily  . darbepoetin (ARANESP) injection - DIALYSIS  40 mcg Intravenous Q Fri-HD  . doxercalciferol  1 mcg Intravenous Q M,W,F-HD  . feeding supplement (NEPRO CARB STEADY)  237 mL Oral BID BM  . insulin aspart  0-9 Units Subcutaneous TID WC  . lactulose  20 g Oral TID  . levothyroxine  175 mcg Oral QAC breakfast  . multivitamin  1 tablet Oral Daily  . sodium chloride  3 mL Intravenous Q12H

## 2015-11-10 DIAGNOSIS — G9340 Encephalopathy, unspecified: Secondary | ICD-10-CM

## 2015-11-10 LAB — GLUCOSE, CAPILLARY
GLUCOSE-CAPILLARY: 217 mg/dL — AB (ref 65–99)
Glucose-Capillary: 230 mg/dL — ABNORMAL HIGH (ref 65–99)

## 2015-11-10 MED ORDER — MORPHINE SULFATE (CONCENTRATE) 10 MG/0.5ML PO SOLN
5.0000 mg | ORAL | Status: DC | PRN
  Filled 2015-11-10: qty 0.5

## 2015-11-10 MED ORDER — PREDNISONE 20 MG PO TABS
20.0000 mg | ORAL_TABLET | Freq: Every day | ORAL | Status: DC
  Administered 2015-11-11: 20 mg via ORAL
  Filled 2015-11-10: qty 1

## 2015-11-10 MED ORDER — ALTEPLASE 2 MG IJ SOLR
2.0000 mg | Freq: Once | INTRAMUSCULAR | Status: DC | PRN
  Filled 2015-11-10: qty 2

## 2015-11-10 MED ORDER — LIDOCAINE-PRILOCAINE 2.5-2.5 % EX CREA
1.0000 "application " | TOPICAL_CREAM | CUTANEOUS | Status: DC | PRN
  Filled 2015-11-10: qty 5

## 2015-11-10 MED ORDER — SODIUM CHLORIDE 0.9 % IV SOLN: 100.0000 mL | INTRAVENOUS | Status: DC | PRN

## 2015-11-10 MED ORDER — PENTAFLUOROPROP-TETRAFLUOROETH EX AERO: 1.0000 "application " | INHALATION_SPRAY | CUTANEOUS | Status: DC | PRN

## 2015-11-10 MED ORDER — LIDOCAINE HCL (PF) 1 % IJ SOLN: 5.0000 mL | INTRAMUSCULAR | Status: DC | PRN

## 2015-11-10 MED ORDER — HEPARIN SODIUM (PORCINE) 1000 UNIT/ML DIALYSIS: 1000.0000 [IU] | INTRAMUSCULAR | Status: DC | PRN

## 2015-11-10 NOTE — Consult Note (Signed)
WOC wound consult note Reason for Consult: pressure injury Patient noted to have Stage 2 PI at the time of admission. This has evolved and now is Unstagable Wound type: Unstageable Pressure Injury left upper buttock/sacrum region Pressure Ulcer POA: Yes Measurement: 9cm x 6cm x 0.1cm  Wound bed:20% pink at wound periphery, 80% leathery, black eschar.  Not fluctuant, no drainage.   Drainage (amount, consistency, odor) none Periwound:intact  Dressing procedure/placement/frequency: Add enzymatic debridement ointment to clear eschar.  Pressure redistribution chair pad when up in chair.  Add LALM for pressure redistribution while in bed,  Will need air mattress at time of DC to whatever next level of care needed.  WOC will follow along for weekly wound assessments.  Will ask RD to evaluate for diet enhancements for wound healing within renal diet limitations.  Explained progression of wound to family member at bedside.  Patient with multiple medical issues.  I am not surprised with these medical issues that his skin is also failing him now.  Family member verbalized understanding.  WOC team will follow along with you for weekly wound assessments.  Please notify me of any acute changes in the wounds or any new areas of concerns Armen Pickup RN,CWOCN [phone]

## 2015-11-10 NOTE — Progress Notes (Signed)
[facility] [employer]   Received request from Felicity for family interest in [facility]. Chart reviewed.  [facility] available tomorrow. Met with son, Coralyn Mark at 5:45 PM to complete paper work for transfer tomorrow.  ? Please fax discharge summary to [phone]. ? RN please call report to [phone]. ? Please arrange transport for as early in day as possible.  ? Thank you, Freddi Starr RN, [facility] Liaison [phone]

## 2015-11-10 NOTE — Care Management Note (Signed)
Case Management Note  Patient Details  Name: [name] [name] MRN: [id number] Date of Birth: October 14, 1936  Subjective/Objective:    Sepsis, metabolic encephalopathy, ESRD                Action/Plan: Family has decided on Residential Hospice at [facility]. Referral to CSW for Residential Hospice.   Expected Discharge Date:  11/11/2015               Expected Discharge Plan:  Hospice Medical Facility  In-House Referral:  Clinical Social Work  Discharge planning Services  CM Consult  Post Acute Care Choice:  NA Choice offered to:  NA  DME Arranged:  N/A DME Agency:  NA  HH Arranged:    HH Agency:  NA  Status of Service:  Completed, signed off  Medicare Important Message Given:  Yes Date Medicare IM Given:    Medicare IM give by:    Date Additional Medicare IM Given:    Additional Medicare Important Message give by:     If discussed at Long Length of Stay Meetings, dates discussed:    Additional Comments:  Elliot Cousin, RN 11/10/2015, 12:05 PM

## 2015-11-10 NOTE — Progress Notes (Signed)
PROGRESS NOTE    [name] [name] ZOX:[id number] DOB: 24-May-1936 DOA: 10/30/2015 PCP: [name], MD  HPI/Brief narrative 80 y.o. M Hx CAD, HTN, HLD, CHF, ESRD dialized M/W/F, hypothyroidism, DM2, and a HCAP admission w/ discharge 1/9 on fortaz and vanc with dialysis (last dose 1/13) who was brought in by family with confusion, and decreased appetite for a couple of days. In the ED, he was febrile to 102.6. He was given zosyn, vanc, and IVF initially. Abx changed to vanc and Merrem. His mental status continued to fluctuate. The patient continued to have intermittent fevers. FUO work up without infection source, likely due to gouty arthritis flare. All his cultures were negative. His antibiotics were subsequent stopped and the patient was monitored clinically. Nephrology continued to see patient. They discussed with patient and family on 1/25 and patient has been transitioned to full comfort care and plans to DC to residential hospice when bed available.   Assessment/Plan:   1. Fever of uncertain origin/sepsis: Patient was extensively evaluated. Blood cultures 2 negative. Imaging studies (CT abdomen/pelvis, CT chest, RUQ ultrasound & HIDA scan-no obvious infectious source). MRI left knee: Moderate complex joint effusion-underwent arthrocentesis 11/08/15 and confirmed gouty crystals. At been empirically treated with several broad-spectrum antibiotic/antifungal's-now discontinued. Intermittent fevers felt to be secondary to left knee acute gouty arthritis and patient was started on prednisone. Last high fever of 101.32F was on 1/19. Improved. No antibiotics since 1/20. 2. Metabolic encephalopathy: Complicating possible underlying cognitive impairment. Hyperammonemia. MRI brain negative for acute stroke or other acute findings. Treated with lactulose. Ammonia level has normalized. 3. Coagulopathy: Secondary to chronic hepatic congestion from right heart failure. Status post vitamin K doses. INR  1.6. Likely has synthetic dysfunction. 4. Abnormal LFTs: Secondary to chronic right heart failure and hepatic congestion. Discontinued fluconazole and Crestor. RUQ ultrasound showed gallbladder thickening. HIDA scan negative. 5. Left knee acute gouty arthritis: Uric acid 5.9. MRI left knee showed moderate effusion. Status post arthrocentesis 11/08/15 showed gouty crystals. Improved after steroids. 6. ESRD on MWF HD: Nephrology following. Discussed with Dr. Arlean Hopping 1/25. Patient refused HD today. 7. Chronic systolic CHF/chronic right heart failure: Clinically compensated. 2-D echo 10/24/15: LVEF 40-45 percent and severe TR. 8. Chronic A. fib with acute RVR: CHADS-VASc 8 . Patient has opted for complete comfort oriented care and hence all medications non-essential for comfort were discontinued by nephrology on 1/25. Cardiology did consult on 1/23. 9. Elevated troponin/CAD status post PCI RCA: Troponin peaked at 0.56. Likely due to demand ischemia. No further workup.  10. Type II DM: A1c 5.9. On oral hypoglycemics at home. 11. Essential hypertension: Controlled. 12. Hypothyroid status post thyroidectomy 13. Anemia of chronic kidney disease 14. Thrombocytopenia 15. Stage I sacral wound 16. Adult failure to thrive: Nephrology/Dr.Schertz discussed with patient and son on 1/25. Patient refused HD today. He indicated that his family is not going to pressure him and they want to proceed with comfort care and residential hospice, preferably at [facility]. Nephrology has discontinued all non-comfort related medications and no further HD. 17. DO NOT RESUSCITATE    DVT prophylaxis: None due to comfort care plan.  Code Status: DO NOT RESUSCITATE  Family Communication: None at bedside this morning  Disposition Plan: DC to residential hospice/[facility] when bed available. Clinical social worker consulted.  Consultants:  Nephrology   Cardiology  Interventional radiology for left knee  arthrocentesis  Procedures:  Hemodialysis-discontinued indefinitely  Antimicrobials:  All antimicrobials discontinued after 1/20  Subjective: Pleasantly confused. Stated that left  knee pain was better. No other complaints reported.  Objective: Filed Vitals:   11/09/15 1610 11/09/15 2043 11/10/15 0440 11/10/15 0917  BP: 99/58 106/56 104/71 111/69  Pulse: 75 42 85 95  Temp: 98.3 F (36.8 C) 97.6 F (36.4 C) 97.6 F (36.4 C) 98 F (36.7 C)  TempSrc: Axillary Oral Oral Oral  Resp: Height:      Weight:      SpO2: 100% 94% 98% 96%    Intake/Output Summary (Last 24 hours) at 11/10/15 1345 Last data filed at 11/10/15 0900  Gross per 24 hour  Intake    780 ml  Output      0 ml  Net    780 ml   Filed Weights   11/08/15 0820 11/08/15 1229 11/08/15 1944  Weight: 72.3 kg (159 lb 6.3 oz) 71.1 kg (156 lb 12 oz) 71 kg (156 lb 8.4 oz)    Exam:  General exam: Pleasant elderly male lying comfortably in bed.  Respiratory system: Clear. No increased work of breathing. Cardiovascular system: S1 & S2 heard, RRR. No JVD, murmurs, gallops, clicks or pedal edema.Not on telemetry.  Gastrointestinal system: Abdomen is nondistended, soft and nontender. Normal bowel sounds heard. Central nervous system: Alert and oriented 2. No focal neurological deficits. Extremities: Symmetric 5 x 5 power.Left knee mildly swollen and warm but nontender and no significant painful range of movement.   Data Reviewed: Basic Metabolic Panel:  Recent Labs Lab 11/04/15 0554 11/05/15 0526 11/06/15 0655 11/07/15 0800 11/08/15 0500  NA 135 133* 136 135 136  K 4.8 5.0 4.5 5.4* 4.6  CL 100* 97* 100* 96* 97*  CO2 24 21* GLUCOSE 101* 128* 165* 128* 135*  BUN 33* 52* 24* 36* 47*  CREATININE 5.32* 6.93* 4.43* 6.19* 7.26*  CALCIUM 8.9 9.1 9.0 9.3 9.6   Liver Function Tests:  Recent Labs Lab 11/04/15 0554 11/05/15 0526 11/06/15 0655 11/07/15 0800 11/08/15 0500  AST 484* 715*  563* 625* 606*  ALT 66* 94* 91* 99* 92*  ALKPHOS 192* 247* 292* 324* 396*  BILITOT 0.9 0.8 0.7 1.6* 0.9  PROT 7.1 6.8 6.9 6.8 6.8  ALBUMIN 1.9* 1.9* 1.8* 1.9* 1.8*   No results for input(s): LIPASE, AMYLASE in the last 168 hours.  Recent Labs Lab 11/04/15 1355 11/06/15 0655 11/07/15 0920 11/08/15 0830  AMMONIA 48* 41* 147* 21   CBC:  Recent Labs Lab 11/04/15 0554 11/05/15 0526 11/05/15 1924 11/06/15 0655 11/07/15 0800 11/08/15 0500  WBC 6.8 6.3 4.6 5.0 4.8 5.0  NEUTROABS 3.8 3.9  --  2.9 3.0  --   HGB 10.9* 10.9* 10.8* 11.2* 11.2* 10.5*  HCT 34.7* 33.9* 35.3* 36.1* 36.0* 33.2*  MCV 94.3 91.6 93.4 94.0 93.5 93.3  PLT 131* 104* 125* 82* 94* 61*   Cardiac Enzymes: No results for input(s): CKTOTAL, CKMB, CKMBINDEX, TROPONINI in the last 168 hours. BNP (last 3 results) No results for input(s): PROBNP in the last 8760 hours. CBG:  Recent Labs Lab 11/09/15 1134 11/09/15 1549 11/09/15 2141 11/10/15 0724 11/10/15 1139  GLUCAP 137* 168* 239* 230* 217*    Recent Results (from the past 240 hour(s))  Culture, body fluid-bottle     Status: None (Preliminary result)   Collection Time: 11/08/15  3:21 PM  Result Value Ref Range Status   Specimen Description FLUID SYNOVIAL LEFT KNEE  Final   Special Requests NONE  Final   Culture NO GROWTH 2 DAYS  Final  Report Status PENDING  Incomplete  Gram stain     Status: None   Collection Time: 11/08/15  3:21 PM  Result Value Ref Range Status   Specimen Description FLUID SYNOVIAL LEFT KNEE  Final   Special Requests NONE  Final   Gram Stain   Final    MODERATE WBC PRESENT, PREDOMINANTLY PMN NO ORGANISMS SEEN    Report Status 11/08/2015 FINAL  Final         Studies: Dg Fluoro Guided Needle Plc Aspiration/injection Loc  11/08/2015  CLINICAL DATA:  80 year old male with left knee joint effusion, fever of unknown origin. Request for knee aspiration and culture. Dialysis patient. Initial encounter. EXAM: LEFT KNEE JOINT  ASPIRATION UNDER FLUOROSCOPY COMPARISON:  Left knee MRI 11/03/2015 FLUOROSCOPY TIME:  Radiation Exposure Index (as provided by the fluoroscopic device): If the device does not provide the exposure index: Fluoroscopy Time (in minutes and seconds):  0 minutes 6 seconds Number of Acquired Images:  None PROCEDURE: Informed consent obtained from the patient son after discussion of risks and benefits. A "time-out" was performed. Suitable skin entry site along the medial aspect of the patella at the left knee was identified. The area was prepped with Betadine, draped in the usual sterile fashion, and infiltrated locally with buffered Lidocaine. Short 20 gauge needle was advanced into the joint space, and 10-12 mL of amber colored fluid was easily aspirated. This was sent to the lab for analysis. Diagnostic injection then of a small volume of Omnipaque 300 demonstrates intra-articular spread. The needle was withdrawn, direct pressure held and hemostasis noted. The patient tolerated the procedure well, and was returned to the ward in stable condition for continued care. IMPRESSION: Technically successful left knee joint aspiration with fluoroscopic assistance. 10-12 mL of amber color fluid was aspirated and sent to the lab for analysis. Electronically Signed   By: Odessa Fleming M.D.   On: 11/08/2015 15:40        Scheduled Meds: . collagenase   Topical Daily  . feeding supplement (NEPRO CARB STEADY)  237 mL Oral BID BM  . insulin aspart  0-9 Units Subcutaneous TID WC  . lactulose  20 g Oral TID  . [START ON 11/11/2015] predniSONE  20 mg Oral Q breakfast   Continuous Infusions:   Principal Problem:   Sepsis (HCC) Active Problems:   Type II diabetes mellitus with renal manifestations (HCC)   Essential hypertension   Coronary atherosclerosis   ESRD on dialysis [facility])   Atrial fibrillation (HCC)   Pressure ulcer   FUO (fever of unknown origin)   Transaminasemia   Acute encephalopathy    Time spent: 25  minutes.    Marcellus Scott, MD, FACP, FHM. [facility] Pager [phone]  If 7PM-7AM, please contact night-coverage [email or web address] Password [email or web address] 11/10/2015, 1:45 PM    LOS: 10 days

## 2015-11-10 NOTE — Progress Notes (Signed)
Subjective:  Eating brk./no cos/pleasantly confused   Objective Vital signs in last 24 hours: Filed Vitals:   11/09/15 0748 11/09/15 1610 11/09/15 2043 11/10/15 0440  BP: 104/61 99/58 106/56 104/71  Pulse: 85 75 42 85  Temp: 98.6 F (37 C) 98.3 F (36.8 C) 97.6 F (36.4 C) 97.6 F (36.4 C)  TempSrc: Oral Axillary Oral Oral  Resp: Height:      Weight:      SpO2: 93% 100% 94% 98%   Weight change:   Physical Exam: General: alert /NAD / pleasantly confused ="[name] [name]/ Thursday/ does [facility]  Heart:  Irreg,Irreg with Vent  Rate stable 80s No rub, mur or gal. Lungs: bilat. CTA Abdomen: soft , ND, nontender active bs  Extremities: no Pedal edema Dialysis Access: Maturing LUA AVF, R IJ perm cath. patent on HD   Dialysis: [facility] 4h 400/800 72.5kg 2/2.25 bath R IJ cath/ LUA AVF maturing Hep 1500 Hect 1 ug tiw   Problem/Plan: 1. Fevers- +urine cx grew fungus, is s/p 3 d of diflucan, all other abx stopped and remains afeb. Blood cxs negative 2. ESRD - MWF HD on schedule started HD Nov 2016/AND  Noted per  Dr. Wallace Keller = ""If MS no better and LFTs remain elevated would consider total comfort care,wanrt [facility]"" 3. Anemia - HGB 11.2. On ESA low dose Follow HGB.  4. Secondary hyperparathyroidism - P 4 - on one phoslo ac; Hectorol hold with correct Ca >10 5. HTN/volume - NO UF today / sl below  dry wt,71 kg on 23rd / BP's on low side. ON Home coreg/ dilt on hold. 6. Nutrition - renal diet/vitamin/nepro alb 1.8/ this am ate 100% brk. On Nepro 7. Hepatic failure [name] [name] -   sec RHF// low plts/ encephalopathy, etc. Poor prognosis, no specific Rx/ family considering Comfort care 8  Coagulopathy-sec to 7/ 1.61 inr yest. 9.  DM - off meds - per primary 10. AMS/ Dementia - MR negative,Amonia incr better with Lactulose/ TME related to #7 11. Chronic afib - coumadin per Pharm. 12. Hypothyroidism -TSH 3.1 1/6 13.  LVEF 40-45% by echo 1/8, LA/RA dilated, severe PA   [name] Pastel, PA-C [facility] Beeper [phone] 11/10/2015,8:30 AM  LOS: 10 days   Pt seen, examined and agree w A/P as above.  Discussed with pt's son this morning. Patient refused HD today.  He says the family is not going to pressure him and they want to proceed with comfort care and residential hospice , preferably at [facility]. Will dc all non-comfort related medications and no further dialysis. Have d/w primary MD.  [name] Moselle MD [facility] pager [phone]    cell [phone] 11/10/2015, 11:37 AM    Labs: Basic Metabolic Panel:  Recent Labs Lab 11/03/15 0900  11/06/15 0655 11/07/15 0800 11/08/15 0500  NA  --   < > 136 135 136  K  --   < > 4.5 5.4* 4.6  CL  --   < > 100* 96* 97*  CO2  --   < > GLUCOSE  --   < > 165* 128* 135*  BUN  --   < > 24* 36* 47*  CREATININE  --   < > 4.43* 6.19* 7.26*  CALCIUM  --   < > 9.0 9.3 9.6  PHOS 4.0  --   --   --   --   < > =  values in this interval not displayed. Liver Function Tests:  Recent Labs Lab 11/06/15 0655 11/07/15 0800 11/08/15 0500  AST 563* 625* 606*  ALT 91* 99* 92*  ALKPHOS 292* 324* 396*  BILITOT 0.7 1.6* 0.9  PROT 6.9 6.8 6.8  ALBUMIN 1.8* 1.9* 1.8*   No results for input(s): LIPASE, AMYLASE in the last 168 hours.  Recent Labs Lab 11/06/15 0655 11/07/15 0920 11/08/15 0830  AMMONIA 41* 147* 21   CBC:  Recent Labs Lab 11/05/15 0526 11/05/15 1924 11/06/15 0655 11/07/15 0800 11/08/15 0500  WBC 6.3 4.6 5.0 4.8 5.0  NEUTROABS 3.9  --  2.9 3.0  --   HGB 10.9* 10.8* 11.2* 11.2* 10.5*  HCT 33.9* 35.3* 36.1* 36.0* 33.2*  MCV 91.6 93.4 94.0 93.5 93.3  PLT 104* 125* 82* 94* 61*    Recent Labs Lab 11/09/15 0747 11/09/15 1134 11/09/15 1549 11/09/15 2141 11/10/15 0724  GLUCAP 134* 137* 168* 239* 230*  Medications:   . calcium acetate  667 mg Oral TID WC  . carvedilol  6.25 mg Oral BID WC  .  collagenase   Topical Daily  . vitamin B-12  500 mcg Oral Daily  . darbepoetin (ARANESP) injection - DIALYSIS  40 mcg Intravenous Q Fri-HD  . doxercalciferol  1 mcg Intravenous Q M,W,F-HD  . feeding supplement (NEPRO CARB STEADY)  237 mL Oral BID BM  . insulin aspart  0-9 Units Subcutaneous TID WC  . lactulose  20 g Oral TID  . levothyroxine  175 mcg Oral QAC breakfast  . multivitamin  1 tablet Oral Daily  . predniSONE  50 mg Oral Q breakfast  . sodium chloride  3 mL Intravenous Q12H  . Warfarin - Physician Dosing Inpatient   Does not apply [phone]

## 2015-11-11 DIAGNOSIS — Z992 Dependence on renal dialysis: Secondary | ICD-10-CM

## 2015-11-11 DIAGNOSIS — N186 End stage renal disease: Secondary | ICD-10-CM

## 2015-11-11 DIAGNOSIS — M1000 Idiopathic gout, unspecified site: Secondary | ICD-10-CM

## 2015-11-11 DIAGNOSIS — R509 Fever, unspecified: Secondary | ICD-10-CM

## 2015-11-11 DIAGNOSIS — I10 Essential (primary) hypertension: Secondary | ICD-10-CM

## 2015-11-11 MED ORDER — NEPRO/CARBSTEADY PO LIQD: 237.0000 mL | Freq: Two times a day (BID) | ORAL | Status: AC

## 2015-11-11 MED ORDER — PREDNISONE 10 MG PO TABS: ORAL_TABLET | ORAL | Status: AC

## 2015-11-11 MED ORDER — MORPHINE SULFATE (CONCENTRATE) 10 MG/0.5ML PO SOLN: 5.0000 mg | ORAL | Status: AC | PRN

## 2015-11-11 NOTE — Consult Note (Signed)
Meeting at bedside with Delaney Meigs, Dept. Director, wife, son [name] and patient.  Clarification of patient desires and family communication that patient is to be discharged for care at [facility].  Patient verified his agreement to transfer for hospice care. He does not desire any further HD treatments.  Confusion apparently was from extended family members who are not HCPOA or legal decision makers for patient.  CSW notified to continue with discharge and transfer to BP plans.  Cindra Presume, RN-MSN, MSN, [facility]

## 2015-11-11 NOTE — Progress Notes (Signed)
Plan is for dc to hospice, no further dialysis.  Appreciate [facility]. Will sign off.   [name] MD [employer] pager [phone]    cell [phone] 11/11/2015, 8:28 AM

## 2015-11-11 NOTE — Discharge Summary (Signed)
Physician Discharge Summary  [name] [name] AJO:[id number] DOB: September 14, 1936 DOA: 10/30/2015  PCP: [name], MD  Admit date: 10/30/2015 Discharge date: 11/11/2015  Time spent: Greater than 30 minutes  Recommendations for Outpatient Follow-up:  1. Patient is being discharged [facility] for end of life/hospice care.  Discharge Diagnoses:  Principal Problem:   Sepsis (HCC) Active Problems:   Type II diabetes mellitus with renal manifestations (HCC)   Essential hypertension   Coronary atherosclerosis   ESRD on dialysis [name])   Atrial fibrillation (HCC)   Pressure ulcer   FUO (fever of unknown origin)   Transaminasemia   Acute encephalopathy   Discharge Condition: poor prognosis with expected decline and eventual death.  Diet recommendation: Regular diet  Filed Weights   11/08/15 0820 11/08/15 1229 11/08/15 1944  Weight: 72.3 kg (159 lb 6.3 oz) 71.1 kg (156 lb 12 oz) 71 kg (156 lb 8.4 oz)    History of present illness:  80 y.o. M Hx CAD, HTN, HLD, CHF, ESRD dialized M/W/F, hypothyroidism, DM2, and a HCAP admission w/ discharge 1/9 on fortaz and vanc with dialysis (last dose 1/13) who was brought in by family with confusion, and decreased appetite for a couple of days. In the ED, he was febrile to 102.6. He was given zosyn, vanc, and IVF initially. Abx changed to vanc and Merrem. His mental status continued to fluctuate. The patient continued to have intermittent fevers. FUO work up without infection source, likely due to gouty arthritis flare. All his cultures were negative. His antibiotics were subsequent stopped and the patient was monitored clinically. Nephrology continued to see patient. They discussed with patient and family on 1/25 and patient has been transitioned to full comfort care and plans to DC to residential hospice.  Hospital Course:    1. Fever of uncertain origin/sepsis: Patient was extensively evaluated. Blood cultures 2 negative. Imaging studies (CT  abdomen/pelvis, CT chest, RUQ ultrasound & HIDA scan-no obvious infectious source). MRI left knee: Moderate complex joint effusion-underwent arthrocentesis 11/08/15 and confirmed gouty crystals. Had been empirically treated with several broad-spectrum antibiotic/antifungal's-now discontinued. Intermittent fevers felt to be secondary to left knee acute gouty arthritis and patient was started on prednisone. Last high fever of 101.37F was on 1/19. Improved. No antibiotics since 1/20. 2. Metabolic encephalopathy: Complicating possible underlying cognitive impairment/dementia. Hyperammonemia. MRI brain negative for acute stroke or other acute findings. Treated with lactulose. Ammonia level has normalized. 3. Coagulopathy: Secondary to chronic hepatic congestion from right heart failure. Status post vitamin K doses. INR 1.6. Likely has synthetic dysfunction. 4. Abnormal LFTs: Secondary to chronic right heart failure and hepatic congestion. Discontinued fluconazole and Crestor. RUQ ultrasound showed gallbladder thickening. HIDA scan negative. 5. Left knee acute gouty arthritis: Uric acid 5.9. MRI left knee showed moderate effusion. Status post arthrocentesis 11/08/15 showed gouty crystals. Acute phase has significantly improved. Taper prednisone gradually over the next 10 days. 6. ESRD on MWF HD: Nephrology following. Discussed with Dr. Arlean Hopping 1/25. Patient refused HD. Now that patient's dialysis has been discontinued, he will progressively become uremic with associated complications/manifestations with rapid decline. 7. Chronic systolic CHF/chronic right heart failure: Clinically compensated. 2-D echo 10/24/15: LVEF 40-45 percent and severe TR. 8. Chronic A. fib with acute RVR: CHADS-VASc 8 . Patient has opted for complete comfort oriented care and hence all medications non-essential for comfort were discontinued by nephrology on 1/25. Cardiology did consult on 1/23. 9. Elevated troponin/CAD status post PCI RCA:  Troponin peaked at 0.56. Likely due to demand ischemia. No  further workup.  10. Type II DM: A1c 5.9. On oral hypoglycemics at home. No hypoglycemic agents at discharge due to complete comfort care. 11. Essential hypertension: Controlled. 12. Hypothyroid status post thyroidectomy 13. Anemia of chronic kidney disease 14. Thrombocytopenia 15. Stage I sacral wound 16. Adult failure to thrive: Nephrology/Dr.Schertz discussed with patient and son on 1/25. Patient refused HD. He indicated that his family is not going to pressure him and they want to proceed with comfort care and residential hospice, preferably at [name]. Nephrology has discontinued all non-comfort related medications and no further HD. On 1/26, there was some confusion when patient's daughter came to visit him and patient and his daughter indicated that he wanted to revert to aggressive care and resume HD. Subsequently palliative care team met with patient, his wife and son and clarified patient's & family desires. They verified that his plan was still to go to residential hospice at [name] and he did not wish to pursue any further hemodialysis treatments. Earlier confusion apparently was from extended family members who are not HCPOA or legal decision-makers for patient. 2. DO NOT RESUSCITATE    Consultants:  Nephrology   Cardiology  Interventional radiology for left knee arthrocentesis  Procedures:  Hemodialysis-discontinued indefinitely  Antimicrobials:  All antimicrobials discontinued after 1/20   Discharge Exam:  Complaints: Patient appeared slightly confused this morning. Denied complaints. Denied left knee pain and moving left knee well. As per RN, no acute issues.  Filed Vitals:   11/10/15 0440 11/10/15 0917 11/10/15 1449 11/11/15 0924  BP: 104/71 111/69  119/70  Pulse: 85 95  83  Temp: 97.6 F (36.4 C) 98 F (36.7 C) 97.6 F (36.4 C) 98.2 F (36.8 C)  TempSrc: Oral Oral Oral Oral  Resp: '16  18  18  '$ Height:      Weight:      SpO2: 98% 96%  98%    General exam: Pleasant elderly male lying comfortably in bed.  Respiratory system: Clear. No increased work of breathing. Cardiovascular system: S1 & S2 heard, RRR. No JVD, murmurs, gallops, clicks or pedal edema. Not on telemetry.  Gastrointestinal system: Abdomen is nondistended, soft and nontender. Normal bowel sounds heard. Central nervous system: Alert and oriented 2. No focal neurological deficits. Extremities: Symmetric 5 x 5 power. Left knee without acute findings at this time.  Discharge Instructions      Discharge Instructions    Call MD for:  difficulty breathing, headache or visual disturbances    Complete by:  As directed      Call MD for:  extreme fatigue    Complete by:  As directed      Call MD for:  persistant dizziness or light-headedness    Complete by:  As directed      Call MD for:  persistant nausea and vomiting    Complete by:  As directed      Call MD for:  severe uncontrolled pain    Complete by:  As directed      Call MD for:  temperature >100.4    Complete by:  As directed      Diet general    Complete by:  As directed      Increase activity slowly    Complete by:  As directed             Medication List    STOP taking these medications        calcium acetate 667 MG capsule  Commonly known  as:  PHOSLO     carvedilol 12.5 MG tablet  Commonly known as:  COREG     cefTAZidime 2 g in dextrose 5 % 50 mL     cyanocobalamin 500 MCG tablet     diltiazem 120 MG 24 hr capsule  Commonly known as:  CARDIZEM CD     glipiZIDE 5 MG tablet  Commonly known as:  GLUCOTROL     levothyroxine 175 MCG tablet  Commonly known as:  SYNTHROID, LEVOTHROID     multivitamin Tabs tablet     olopatadine 0.1 % ophthalmic solution  Commonly known as:  PATANOL     rosuvastatin 20 MG tablet  Commonly known as:  CRESTOR     Vancomycin 750 MG/150ML Soln  Commonly known as:  VANCOCIN     warfarin 1  MG tablet  Commonly known as:  COUMADIN     warfarin 5 MG tablet  Commonly known as:  COUMADIN      TAKE these medications        feeding supplement (NEPRO CARB STEADY) Liqd  Take 237 mLs by mouth 2 (two) times daily between meals.     morphine CONCENTRATE 10 MG/0.5ML Soln concentrated solution  Take 0.25 mLs (5 mg total) by mouth every 4 (four) hours as needed for moderate pain, severe pain, anxiety or shortness of breath.     nitroGLYCERIN 0.4 MG SL tablet  Commonly known as:  NITROSTAT  Place 1 tablet (0.4 mg total) under the tongue every 5 (five) minutes as needed for chest pain.     predniSONE 10 MG tablet  Commonly known as:  DELTASONE  Take 2 tabs daily for 5 days, then 1 tab daily for 5 days, then stop.          The results of significant diagnostics from this hospitalization (including imaging, microbiology, ancillary and laboratory) are listed below for reference.    Significant Diagnostic Studies: Dg Chest 2 View  10/24/2015  CLINICAL DATA:  Nonproductive cough, hypoxemia. EXAM: CHEST  2 VIEW COMPARISON:  Chest x-rays dated 10/20/2015 and 09/04/2015. FINDINGS: Fairly severe cardiomegaly is unchanged. Right chest wall dual lumen catheter is stable in position with tip projected over the right atrium. Central pulmonary vascular congestion and mild bilateral interstitial edema is unchanged, suspected to be chronic. New confluent airspace opacity is seen at the right lung base. IMPRESSION: 1. New confluent airspace opacity at the right lung base, pneumonia versus aspiration. 2. Stable cardiomegaly. The mild central pulmonary vascular congestion and bilateral interstitial edema is not significantly changed, most likely related to a chronic mild CHF. Electronically Signed   By: Franki Cabot M.D.   On: 10/24/2015 11:10   Dg Chest 2 View  10/20/2015  CLINICAL DATA:  Acute onset of altered mental status. Tachycardia. Initial encounter. EXAM: CHEST  2 VIEW COMPARISON:  Chest  radiograph performed 09/04/2015 FINDINGS: The lungs are well-aerated. Vascular congestion is noted. A small left pleural effusion is suspected. Increased interstitial markings likely reflect pulmonary edema. There is no evidence of pneumothorax. The heart is enlarged. No acute osseous abnormalities are seen. A right-sided dual-lumen catheter is noted ending about the cavoatrial junction. IMPRESSION: Vascular congestion and cardiomegaly. Small left pleural effusion suspected. Increased interstitial markings likely reflect pulmonary edema. Electronically Signed   By: Garald Balding M.D.   On: 10/20/2015 22:00   Ct Head Wo Contrast  10/20/2015  CLINICAL DATA:  Altered mental status EXAM: CT HEAD WITHOUT CONTRAST TECHNIQUE: Contiguous axial images were obtained from  the base of the skull through the vertex without intravenous contrast. COMPARISON:  Head CT July 19, 2015; brain MRI July 20, 2015 FINDINGS: There is stable age related volume loss. There is no intracranial mass, hemorrhage, extra-axial fluid collection, or midline shift. There is stable slight small vessel disease in the centra semiovale bilaterally. Elsewhere gray-white compartments are normal. No acute infarct is evident. Calcification along the falx is stable. Bony calvarium appears intact. The mastoid air cells are clear. No intraorbital lesions are identified. IMPRESSION: Slight periventricular small vessel disease, stable. No intracranial mass, hemorrhage, or new gray - white compartment lesions/acute appearing infarct. Stable appearance compared to prior studies. Electronically Signed   By: Lowella Grip III M.D.   On: 10/20/2015 21:26   Ct Chest W Contrast  11/04/2015  CLINICAL DATA:  Fever of unknown origin. History of coronary disease hypertension, congestive heart failure chronic renal disease. EXAM: CT CHEST, ABDOMEN, AND PELVIS WITH CONTRAST TECHNIQUE: Multidetector CT imaging of the chest, abdomen and pelvis was performed  following the standard protocol during bolus administration of intravenous contrast. CONTRAST:  139m OMNIPAQUE IOHEXOL 300 MG/ML  SOLN COMPARISON:  Chest radiograph 10/24/2015, 10/20/2015 on CT thorax 03/12/2004 FINDINGS: CT CHEST FINDINGS Mediastinum/Nodes: No axillary or supraclavicular adenopathy. The paratracheal adenopathy is present. Example 16 mm short axis RIGHT lower paratracheal lymph node which is increased from 12 mm on prior. RIGHT hilar lymph node measures 20 mm on image 25 series 201 increased from 18 mm. AP window node measures 9 mm compared to 11 mm. No central pulmonary embolism. Borderline enlarged LEFT supraclavicular lymph node measures 11 mm (image 5, series 201) Cardiomegaly noted. Lungs/Pleura: There is interlobular septal thickening in the lungs. Extensive centrilobular emphysema. There is loculated fluid collections along the RIGHT oblique fissure. No consolidation to suggest active pulmonary infection. Musculoskeletal: No aggressive osseous lesion. CT ABDOMEN AND PELVIS FINDINGS Hepatobiliary: There is reflux from the injection bolus into the hepatic veins consistent with RIGHT heart insufficiency. No focal hepatic lesion. Several gallstones collect within the decompressed gallbladder. No biliary duct dilatation. Pancreas: Pancreas is normal. No ductal dilatation. No pancreatic inflammation. Spleen: Low-attenuation lesion within the spleen is rounded measuring 3.8 cm. This is new from 2005. Adrenals/urinary tract: Adrenal glands are normal. Poor renal cortical enhancement. High-density lesion measuring 8 mm in the RIGHT renal cortex likely represents hemorrhagic cyst. Ureters and bladder normal. The bladder is collapsed Stomach/Bowel: Stomach, small bowel and cecum are normal. The appendix is not identified. Small amount fluid inferior to the cecal tip (image 99 of the axial series). Colon rectosigmoid colon are normal. No evidence of colonic inflammation or infection Vascular/Lymphatic:  Abdominal aorta is normal caliber with atherosclerotic calcification. There is no retroperitoneal or periportal lymphadenopathy. No pelvic lymphadenopathy. Reproductive: Prostate normal Other: No abscess in the abdomen pelvis. Musculoskeletal: No aggressive osseous lesion. Gas in the L4-L5 disc space likely relates to degenerative change. There is significant arthropathy at the facet joints at this level. IMPRESSION: Chest Impression: 1. Progressive mediastinal and hilar lymphadenopathy from CT 2005. This may relate to congestive heart failure. Cannot exclude lymphoproliferative process such as CLL. No significant adenopathy in the abdomen or axilla would favor against lymphoma. 2. Cardiomegaly and loculated pleural effusions with interlobular septal thickening likely relates to congestive heart failure. RIGHT heart insufficiency noted with contrast reflux into hepatic veins. 3. Centrilobular emphysema. 4. No evidence of acute pulmonary infection. Abdomen / Pelvis Impression: 1. Large round lesion within the spleen is new from 2005. In patient with fever  of unknown origin cannot exclude a splenic abscess. No evidence of enhancement with favor benign cyst. Consider MRI of the abdomen with contrast. Of note, if patient cannot hold breath then MRI may not be a suitable imaging modality. In that case, consider FDG PET scan for evaluation if clinical suspicion warrants. 2. No evidence of abscess or inflammation the peritoneal space. 3. Small amount of free fluid along the RIGHT pericolic gutter likely relates to heart failure described above. 4. Cholelithiasis without evidence acute cholecystitis. Electronically Signed   By: Suzy Bouchard M.D.   On: 11/04/2015 08:29   Mr Brain Wo Contrast  11/07/2015  CLINICAL DATA:  Initial evaluation for acute encephalopathy. EXAM: MRI HEAD WITHOUT CONTRAST TECHNIQUE: Multiplanar, multiecho pulse sequences of the brain and surrounding structures were obtained without intravenous  contrast. COMPARISON:  Prior MRI from 07/20/2015. FINDINGS: Study degraded by motion artifact. Diffuse prominence of the CSF containing spaces compatible with generalized age-related cerebral atrophy. Confluent T2/FLAIR hyperintensity within the periventricular white matter most consistent with mild chronic small vessel ischemic disease. No abnormal foci of restricted diffusion to suggest acute intracranial infarct. Gray-white matter differentiation maintained. Major intracranial vascular flow voids grossly preserved. No acute or chronic intracranial hemorrhage. No mass lesion, midline shift, or mass effect. No hydrocephalus. No extra-axial fluid collection. Craniocervical junction within normal limits. Mild degenerative spondylolysis present within the upper cervical spine without stenosis. Pituitary gland within normal limits. No acute abnormality about the orbits. Mild mucosal thickening within the left maxillary sinus. Paranasal sinuses are otherwise largely clear. Trace opacity within the left mastoid air cells. Right mastoid air cells clear. Inner ear structures within normal limits. Bone marrow signal intensity within normal limits. No scalp soft tissue abnormality. IMPRESSION: 1. No acute intracranial infarct or other process identified. 2. Age-related cerebral atrophy with mild chronic small vessel ischemic disease, stable. Electronically Signed   By: Jeannine Boga M.D.   On: 11/07/2015 00:13   Ct Abdomen Pelvis W Contrast  11/04/2015  CLINICAL DATA:  Fever of unknown origin. History of coronary disease hypertension, congestive heart failure chronic renal disease. EXAM: CT CHEST, ABDOMEN, AND PELVIS WITH CONTRAST TECHNIQUE: Multidetector CT imaging of the chest, abdomen and pelvis was performed following the standard protocol during bolus administration of intravenous contrast. CONTRAST:  129m OMNIPAQUE IOHEXOL 300 MG/ML  SOLN COMPARISON:  Chest radiograph 10/24/2015, 10/20/2015 on CT thorax  03/12/2004 FINDINGS: CT CHEST FINDINGS Mediastinum/Nodes: No axillary or supraclavicular adenopathy. The paratracheal adenopathy is present. Example 16 mm short axis RIGHT lower paratracheal lymph node which is increased from 12 mm on prior. RIGHT hilar lymph node measures 20 mm on image 25 series 201 increased from 18 mm. AP window node measures 9 mm compared to 11 mm. No central pulmonary embolism. Borderline enlarged LEFT supraclavicular lymph node measures 11 mm (image 5, series 201) Cardiomegaly noted. Lungs/Pleura: There is interlobular septal thickening in the lungs. Extensive centrilobular emphysema. There is loculated fluid collections along the RIGHT oblique fissure. No consolidation to suggest active pulmonary infection. Musculoskeletal: No aggressive osseous lesion. CT ABDOMEN AND PELVIS FINDINGS Hepatobiliary: There is reflux from the injection bolus into the hepatic veins consistent with RIGHT heart insufficiency. No focal hepatic lesion. Several gallstones collect within the decompressed gallbladder. No biliary duct dilatation. Pancreas: Pancreas is normal. No ductal dilatation. No pancreatic inflammation. Spleen: Low-attenuation lesion within the spleen is rounded measuring 3.8 cm. This is new from 2005. Adrenals/urinary tract: Adrenal glands are normal. Poor renal cortical enhancement. High-density lesion measuring 8 mm in  the RIGHT renal cortex likely represents hemorrhagic cyst. Ureters and bladder normal. The bladder is collapsed Stomach/Bowel: Stomach, small bowel and cecum are normal. The appendix is not identified. Small amount fluid inferior to the cecal tip (image 99 of the axial series). Colon rectosigmoid colon are normal. No evidence of colonic inflammation or infection Vascular/Lymphatic: Abdominal aorta is normal caliber with atherosclerotic calcification. There is no retroperitoneal or periportal lymphadenopathy. No pelvic lymphadenopathy. Reproductive: Prostate normal Other: No  abscess in the abdomen pelvis. Musculoskeletal: No aggressive osseous lesion. Gas in the L4-L5 disc space likely relates to degenerative change. There is significant arthropathy at the facet joints at this level. IMPRESSION: Chest Impression: 1. Progressive mediastinal and hilar lymphadenopathy from CT 2005. This may relate to congestive heart failure. Cannot exclude lymphoproliferative process such as CLL. No significant adenopathy in the abdomen or axilla would favor against lymphoma. 2. Cardiomegaly and loculated pleural effusions with interlobular septal thickening likely relates to congestive heart failure. RIGHT heart insufficiency noted with contrast reflux into hepatic veins. 3. Centrilobular emphysema. 4. No evidence of acute pulmonary infection. Abdomen / Pelvis Impression: 1. Large round lesion within the spleen is new from 2005. In patient with fever of unknown origin cannot exclude a splenic abscess. No evidence of enhancement with favor benign cyst. Consider MRI of the abdomen with contrast. Of note, if patient cannot hold breath then MRI may not be a suitable imaging modality. In that case, consider FDG PET scan for evaluation if clinical suspicion warrants. 2. No evidence of abscess or inflammation the peritoneal space. 3. Small amount of free fluid along the RIGHT pericolic gutter likely relates to heart failure described above. 4. Cholelithiasis without evidence acute cholecystitis. Electronically Signed   By: Suzy Bouchard M.D.   On: 11/04/2015 08:29   Mr Knee Left  Wo Contrast  11/03/2015  CLINICAL DATA:  80 year old in patient on dialysis with left-greater-than-right knee pain. Fever of unknown origin. EXAM: MRI OF THE LEFT KNEE WITHOUT CONTRAST TECHNIQUE: Multiplanar, multisequence MR imaging of the knee was performed. No intravenous contrast was administered. COMPARISON:  None. FINDINGS: Study is mildly motion degraded. MENISCI Medial meniscus: Meniscal degeneration with irregular radial  tear in the posterior horn. The meniscal root is intact. No centrally displaced meniscal fragment. Lateral meniscus:  Meniscal degeneration without discrete tear. LIGAMENTS Cruciates:  Intact with mild mucoid degeneration. Collaterals: Intact. There is a small amount of medial bursal fluid. The popliteus tendon is thickened near its femoral insertion. CARTILAGE Patellofemoral:  Adequately preserved. Medial: Mild chondral thinning and surface irregularity. There is mild subchondral edema and intraosseous ganglion formation throughout the medial tibial plateau. Lateral:  Adequately preserved. OTHER Joint: Moderate size complex joint effusion consistent with synovitis. No discrete intra-articular loose bodies observed. Popliteal Fossa:  Small mildly complex Baker's cyst. Extensor Mechanism: The distal quadriceps tendon appears normal. There is patellar tendinosis with a long segment of tubular T2 hyperintensity in the medial aspect of the tendon Bones: There are several intraosseous ganglia in the proximal tibia, largest posteriorly in the medial tibial plateau, measuring up to 1.6 cm. There is no evidence of periarticular bone marrow edema or osteomyelitis. IMPRESSION: 1. Degenerative radial tear of the posterior horn of the medial meniscus. 2. Lateral meniscal degeneration without evidence of tear. 3. Complex joint effusion consistent with synovitis. No specific evidence of infection or osteomyelitis. Correlate clinically. 4. Intra tendinous cyst formation medially in the patellar tendon, likely a ganglion cyst. This could indicate underlying interstitial tendon tearing. Electronically Signed  By: Richardean Sale M.D.   On: 11/03/2015 19:59   Nm Hepato W/eject Fract  11/05/2015  CLINICAL DATA:  Gallbladder wall thickening. Multiple stones. Elevated LFTs. EXAM: NUCLEAR MEDICINE HEPATOBILIARY IMAGING TECHNIQUE: Sequential images of the abdomen were obtained out to 60 minutes following intravenous administration of  radiopharmaceutical. RADIOPHARMACEUTICALS:  5.2 mCi Tc-62m Choletec IV COMPARISON:  11/04/2015 ultrasound FINDINGS: There is prompt uptake and excretion of radiotracer by the liver. Activity is seen in the gallbladder by 30 minutes and small bowel by 55 minutes. IMPRESSION: No evidence of cystic duct or common bile duct obstruction. Electronically Signed   By: KRolm BaptiseM.D.   On: 11/05/2015 15:29   Dg Chest Port 1 View  10/30/2015  CLINICAL DATA:  Acute onset of shortness of breath. Initial encounter. EXAM: PORTABLE CHEST 1 VIEW COMPARISON:  Chest radiograph performed 10/24/2015 FINDINGS: The patient's right-sided dual-lumen catheter is noted ending overlying the right atrium. The lungs are well-aerated. Vascular congestion is noted. Increased interstitial markings raise concern for mild interstitial edema. A small left pleural effusion may be present. No pneumothorax is seen. The cardiomediastinal silhouette is mildly enlarged. No acute osseous abnormalities are seen. IMPRESSION: Vascular congestion and mild cardiomegaly. Increased interstitial markings raise concern for mild interstitial edema. Small left pleural effusion may be present. Electronically Signed   By: JGarald BaldingM.D.   On: 10/30/2015 23:54   Dg Fluoro Guided Needle Plc Aspiration/injection Loc  11/08/2015  CLINICAL DATA:  80year old male with left knee joint effusion, fever of unknown origin. Request for knee aspiration and culture. Dialysis patient. Initial encounter. EXAM: LEFT KNEE JOINT ASPIRATION UNDER FLUOROSCOPY COMPARISON:  Left knee MRI 11/03/2015 FLUOROSCOPY TIME:  Radiation Exposure Index (as provided by the fluoroscopic device): If the device does not provide the exposure index: Fluoroscopy Time (in minutes and seconds):  0 minutes 6 seconds Number of Acquired Images:  None PROCEDURE: Informed consent obtained from the patient son after discussion of risks and benefits. A "time-out" was performed. Suitable skin entry site  along the medial aspect of the patella at the left knee was identified. The area was prepped with Betadine, draped in the usual sterile fashion, and infiltrated locally with buffered Lidocaine. Short 20 gauge needle was advanced into the joint space, and 10-12 mL of amber colored fluid was easily aspirated. This was sent to the lab for analysis. Diagnostic injection then of a small volume of Omnipaque 300 demonstrates intra-articular spread. The needle was withdrawn, direct pressure held and hemostasis noted. The patient tolerated the procedure well, and was returned to the ward in stable condition for continued care. IMPRESSION: Technically successful left knee joint aspiration with fluoroscopic assistance. 10-12 mL of amber color fluid was aspirated and sent to the lab for analysis. Electronically Signed   By: HGenevie AnnM.D.   On: 11/08/2015 15:40   [place]domen Limited Ruq  11/04/2015  CLINICAL DATA:  Elevated liver function tests. EXAM: [place]DOMEN LIMITED - RIGHT UPPER QUADRANT COMPARISON:  Chest, abdomen and pelvis CT obtained earlier today. FINDINGS: Gallbladder: Diffuse gallbladder wall thickening measuring 7 mm in thickness. Multiple small echogenic foci adherent to the walls of the gallbladder without shadowing. Small amount of corresponding calcific density on the recent CT. Mild pericholecystic fluid. No tenderness over the gallbladder today. Common bile duct: Diameter: 2.0 mm Liver: Dilated hepatic veins. To and fro flow within the portal vein. Minimal ascites. IMPRESSION: 1. Diffuse gallbladder wall thickening. This could be due to right heart failure or chronic  cholecystitis. 2. Multiple tiny, adherent stones in the gallbladder. 3. Dilated hepatic veins and to and fro flow in the portal vein, compatible with hepatic congestion due to right heart failure. 4. Minimal ascites and mild pericholecystic fluid. Electronically Signed   By: Claudie Revering M.D.   On: 11/04/2015 16:30    Microbiology: Recent  Results (from the past 240 hour(s))  Culture, body fluid-bottle     Status: None (Preliminary result)   Collection Time: 11/08/15  3:21 PM  Result Value Ref Range Status   Specimen Description FLUID SYNOVIAL LEFT KNEE  Final   Special Requests NONE  Final   Culture NO GROWTH 3 DAYS  Final   Report Status PENDING  Incomplete  Gram stain     Status: None   Collection Time: 11/08/15  3:21 PM  Result Value Ref Range Status   Specimen Description FLUID SYNOVIAL LEFT KNEE  Final   Special Requests NONE  Final   Gram Stain   Final    MODERATE WBC PRESENT, PREDOMINANTLY PMN NO ORGANISMS SEEN    Report Status 11/08/2015 FINAL  Final     Labs: Basic Metabolic Panel:  Recent Labs Lab 11/05/15 0526 11/06/15 0655 11/07/15 0800 11/08/15 0500  NA 133* 136 135 136  K 5.0 4.5 5.4* 4.6  CL 97* 100* 96* 97*  CO2 21* '24 23 22  '$ GLUCOSE 128* 165* 128* 135*  BUN 52* 24* 36* 47*  CREATININE 6.93* 4.43* 6.19* 7.26*  CALCIUM 9.1 9.0 9.3 9.6   Liver Function Tests:  Recent Labs Lab 11/05/15 0526 11/06/15 0655 11/07/15 0800 11/08/15 0500  AST 715* 563* 625* 606*  ALT 94* 91* 99* 92*  ALKPHOS 247* 292* 324* 396*  BILITOT 0.8 0.7 1.6* 0.9  PROT 6.8 6.9 6.8 6.8  ALBUMIN 1.9* 1.8* 1.9* 1.8*   No results for input(s): LIPASE, AMYLASE in the last 168 hours.  Recent Labs Lab 11/06/15 0655 11/07/15 0920 11/08/15 0830  AMMONIA 41* 147* 21   CBC:  Recent Labs Lab 11/05/15 0526 11/05/15 1924 11/06/15 0655 11/07/15 0800 11/08/15 0500  WBC 6.3 4.6 5.0 4.8 5.0  NEUTROABS 3.9  --  2.9 3.0  --   HGB 10.9* 10.8* 11.2* 11.2* 10.5*  HCT 33.9* 35.3* 36.1* 36.0* 33.2*  MCV 91.6 93.4 94.0 93.5 93.3  PLT 104* 125* 82* 94* 61*   Cardiac Enzymes: No results for input(s): CKTOTAL, CKMB, CKMBINDEX, TROPONINI in the last 168 hours. BNP: BNP (last 3 results)  Recent Labs  12/10/14 0530 08/29/15 1235 09/03/15 1356  BNP 326.5* 1483.1* 1191.8*    ProBNP (last 3 results) No results for  input(s): PROBNP in the last 8760 hours.  CBG:  Recent Labs Lab 11/09/15 1134 11/09/15 1549 11/09/15 2141 11/10/15 0724 11/10/15 1139  GLUCAP 137* 168* 239* 230* 217*       Signed:  Vernell Leep, MD, FACP, FHM. [facility] Pager [phone]  If 7PM-7AM, please contact night-coverage [email or web address] Password [email or web address] 11/11/2015, 2:05 PM

## 2015-11-11 NOTE — Progress Notes (Signed)
In room to meet with patient following being made aware by patients NT that patients wife had requested to speak with department director. Meeting at bedside with Cindra Presume, Palliative Care, Pts wife and son [name] and 2 other family members. During the conversation patient and family clarified wishes for patient to transfer to [facility] for palliative care services. Patient verbalized his desire to transfer to [facility] place for his care.   Apology offered to patient and family for any miscommunication or confusion with the plan caused by earlier conversations with patient daughter. Thankful for conversation.  Notified Genelle Bal and Dr. Waymon Amato of patient and family wishes.   Jream Broyles, Charlyne Quale

## 2015-11-11 NOTE — Progress Notes (Addendum)
One of patient's daughters at nurse's station. Requested to speak to patient's RN. She stated that the patient has decided he does not want to be transferred to [facility] and does not want hospice at this time. Per her, he would like to resume dialysis and resume all medical treatment. When I questioned patient, he would not give definitive answers, continued to state "it's okay, I'll do whatever, it's okay." Dr. Waymon Amato notified. [name], PA with nephrology also notified. Dr. Waymon Amato to re-consult Palliative Care to discuss goals of care. Will continue to follow/monitor.  Leanna Battles, RN.

## 2015-11-11 NOTE — Progress Notes (Signed)
Report called to [facility]. CSW to arrange for [facility] transportation.  Leanna Battles, RN.

## 2015-11-11 NOTE — Plan of Care (Signed)
Problem: Health Behavior/Discharge Planning: Goal: Ability to manage health-related needs will improve Outcome: Adequate for Discharge Patient will be discharged to hospice at [facility].  Problem: Skin Integrity: Goal: Risk for impaired skin integrity will decrease Outcome: Adequate for Discharge Patient with known pressure ulcer on left buttock.  Problem: Nutrition: Goal: Adequate nutrition will be maintained Outcome: Adequate for Discharge Poor PO intake.  Problem: Respiratory: Goal: Verbalizations of increased ease of respirations will increase Outcome: Adequate for Discharge No respiratory distress at this time.  Problem: Role Relationship: Goal: Family's ability to cope with current situation will improve Outcome: Adequate for Discharge Patient with a lot of family support.

## 2015-11-13 LAB — CULTURE, BODY FLUID W GRAM STAIN -BOTTLE

## 2015-11-13 LAB — CULTURE, BODY FLUID-BOTTLE: CULTURE: NO GROWTH

## 2015-11-15 ENCOUNTER — Encounter (HOSPITAL_COMMUNITY): Payer: Medicare Other

## 2015-11-15 ENCOUNTER — Encounter: Payer: Medicare Other | Admitting: Surgery

## 2015-11-15 ENCOUNTER — Encounter: Payer: Self-pay | Admitting: Surgery

## 2015-11-19 ENCOUNTER — Telehealth: Payer: Self-pay | Admitting: Family Medicine

## 2015-11-19 NOTE — Telephone Encounter (Signed)
Pt son call to say that patient passed away on 11-24-15 and left his phone number he said in case you want to call .    [phone]

## 2015-11-22 ENCOUNTER — Encounter: Payer: Medicare Other | Admitting: Surgery

## 2015-11-22 ENCOUNTER — Encounter (HOSPITAL_COMMUNITY): Payer: Medicare Other

## 2015-11-23 ENCOUNTER — Telehealth: Payer: Self-pay | Admitting: Surgery

## 2015-11-25 NOTE — Telephone Encounter (Signed)
Suandrea-can you help with this?  Alcario Drought showed me how to do this but it does not appear in the pts chart.

## 2015-11-25 NOTE — Telephone Encounter (Signed)
Yes thanks, patient needs to be entered as deceased

## 2015-11-25 NOTE — Telephone Encounter (Signed)
Left voicemail with son offering condolences. Please make sure to mark patient as deceased.

## 2015-12-15 DEATH — deceased

## 2015-12-28 ENCOUNTER — Ambulatory Visit: Payer: Medicare Other | Admitting: Family Medicine

## 2016-12-02 IMAGING — DX DG CHEST 2V
2 series · 2 of 2 positions shown · non-contrast
Comparison: 07/22/2015

CLINICAL DATA: Acute shortness of breath, history CHF

EXAM:
CHEST  2 VIEW

[chest pa]
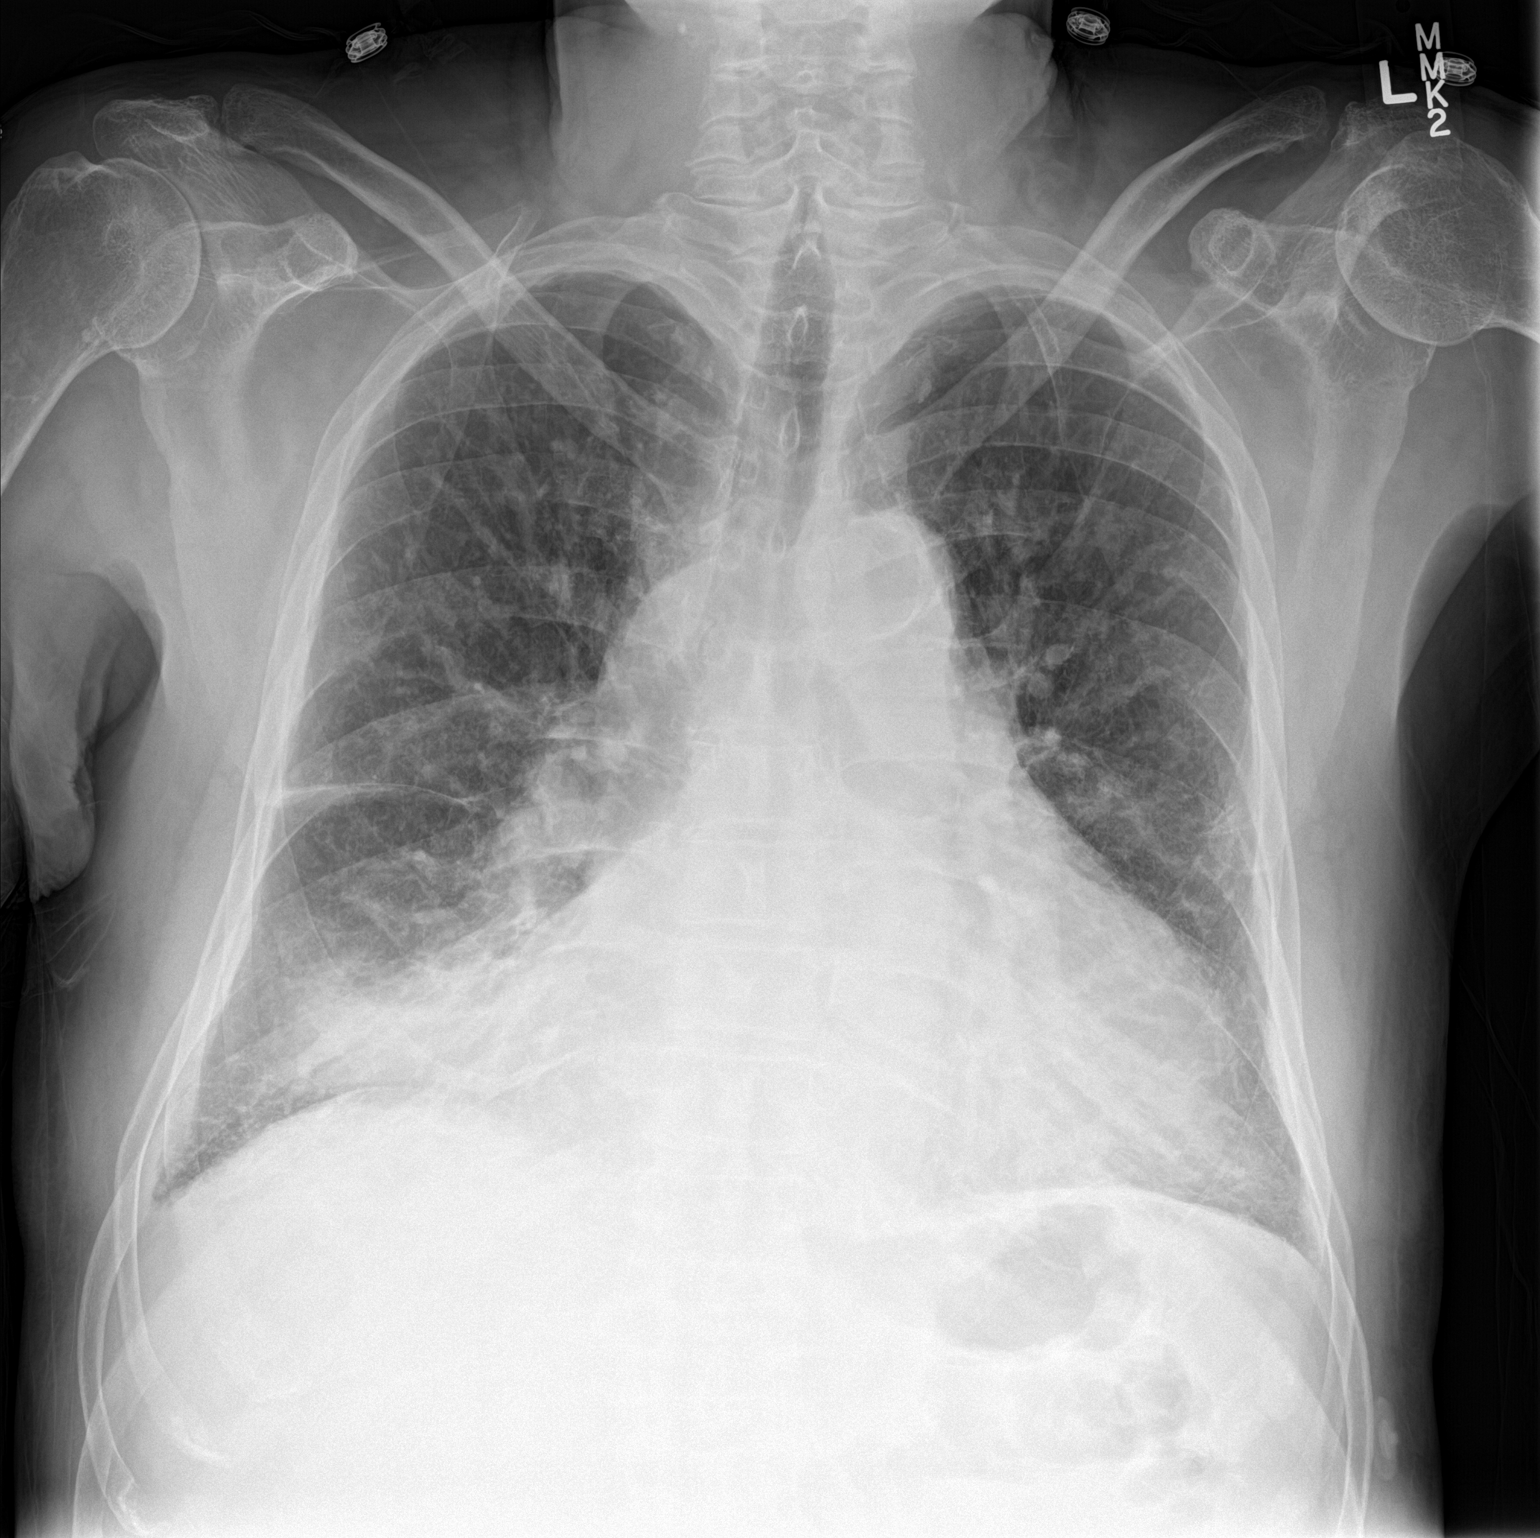

[chest lat]
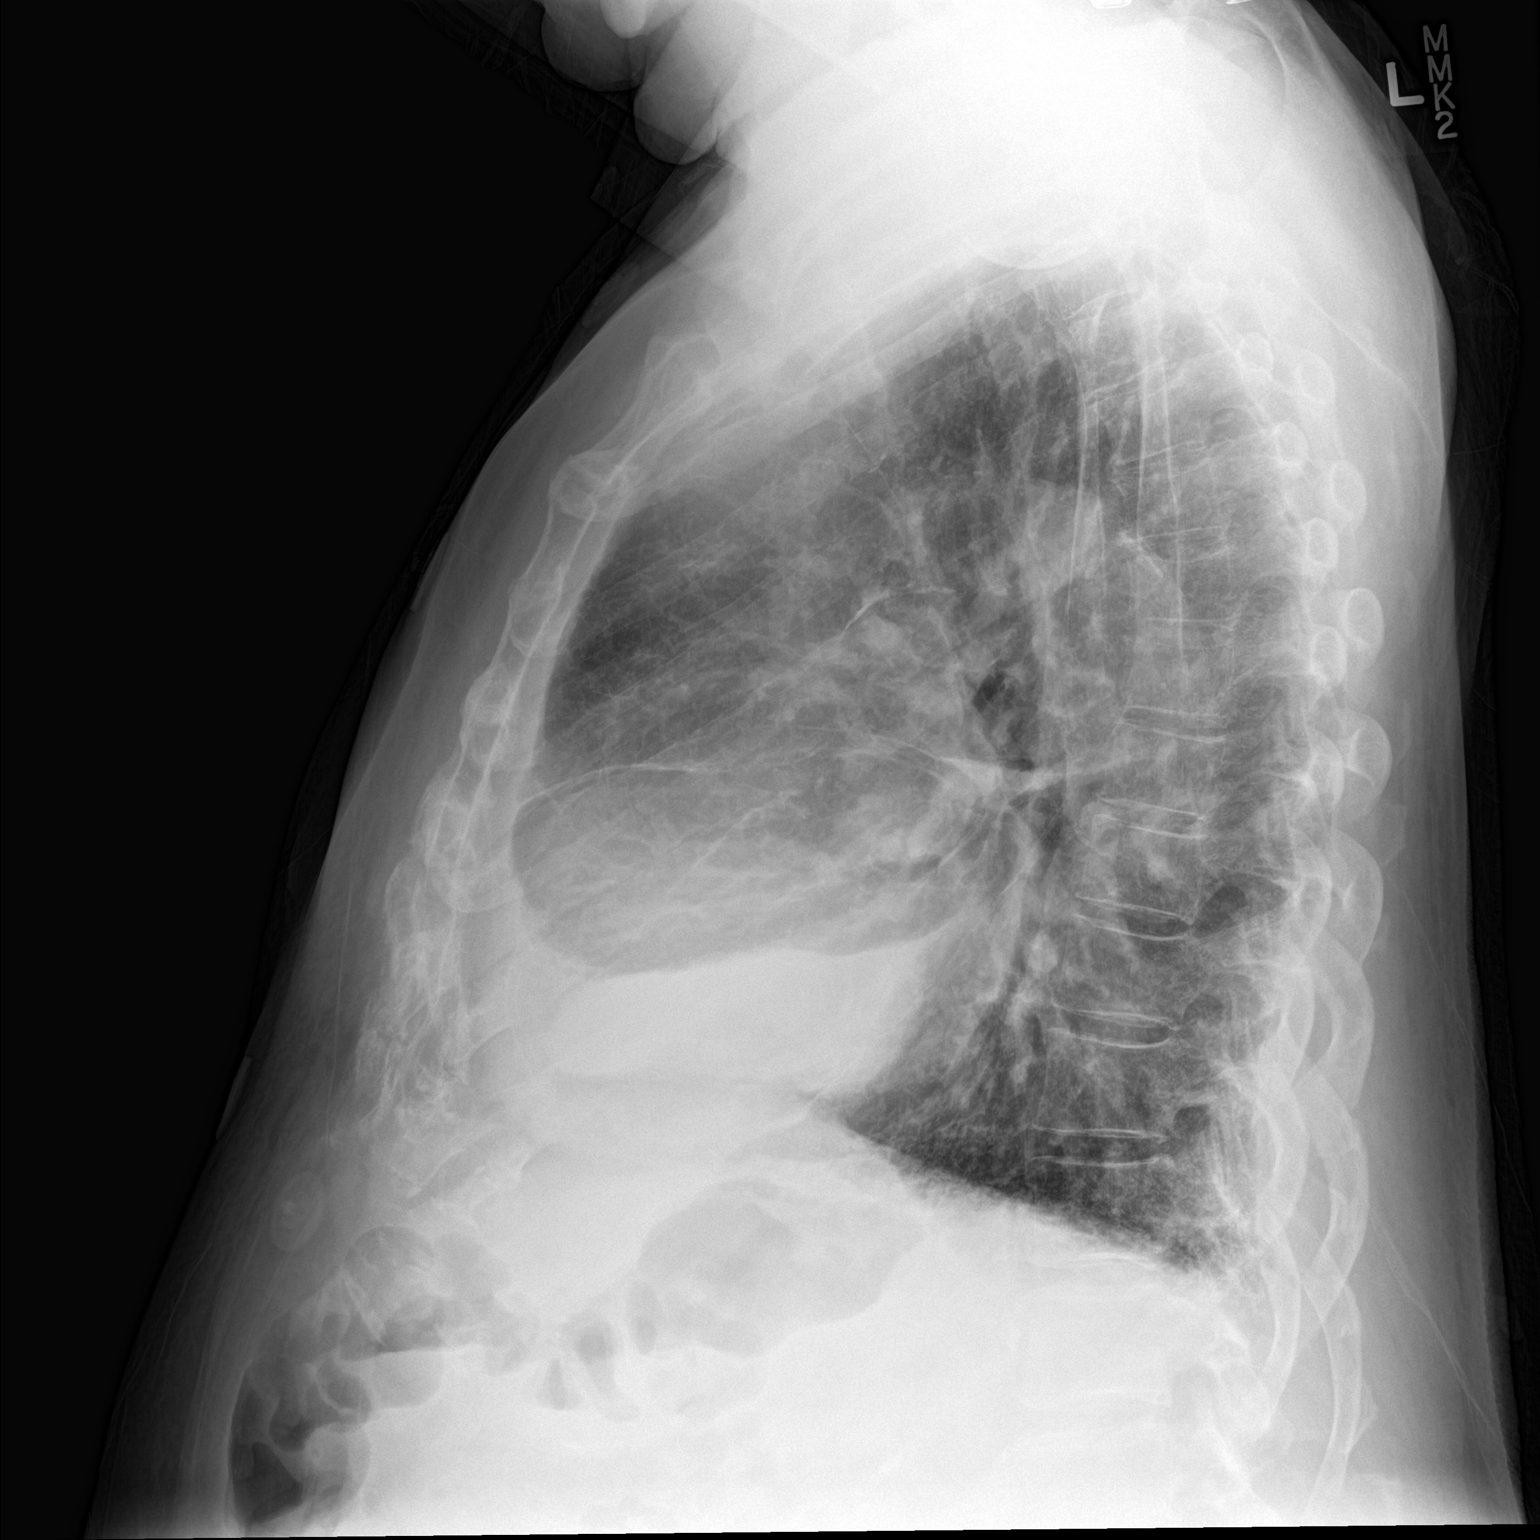

[2 of 2 positions shown; findings below may reference images not displayed]

FINDINGS: Dense consolidative opacity in the right middle lobe obscures the
right cardiac border compatible with pneumonia or partial collapse.
Heart is enlarged. Chronic vascular congestion noted without
definite CHF. No effusion or pneumothorax. Trachea midline.
Atherosclerosis noted of the aorta.
IMPRESSION: Right middle lobe dense consolidative opacity compatible with
partial collapse versus consolidative pneumonia.

Cardiomegaly with chronic vascular congestion

Recommend radiographic follow-up after medical therapy to document
resolution.

## 2017-02-07 IMAGING — CT CT ABD-PELV W/ CM
1 of 5 series · 15 of 46 positions shown, 17 images · IV contrast (Iodine)
Comparison: Chest radiograph 10/24/2015, 10/20/2015 on CT thorax
03/12/2004

CLINICAL DATA: Fever of unknown origin. History of coronary disease
hypertension, congestive heart failure chronic renal disease.

EXAM:
CT CHEST, ABDOMEN, AND PELVIS WITH CONTRAST
TECHNIQUE: Multidetector CT imaging of the chest, abdomen and pelvis was
performed following the standard protocol during bolus
administration of intravenous contrast.
CONTRAST:  100mL OMNIPAQUE IOHEXOL 300 MG/ML  SOLN

[Series 201: cap with, idose (2) · axial · 0.81mm/px · z∈[+109,+694]mm · 15 of 133 slices shown, 17 images]
[im 8/133  soft-tissue]
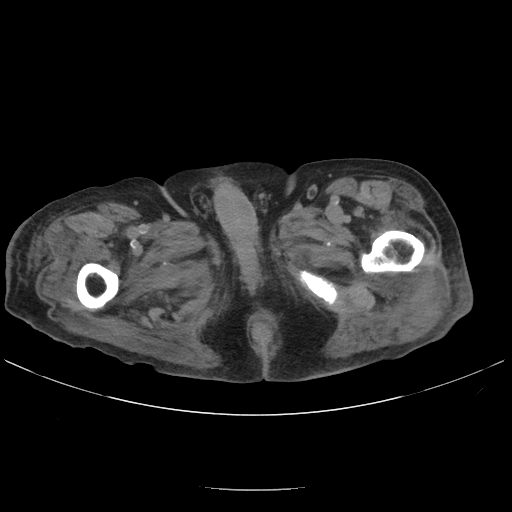
[im 8/133  bone]
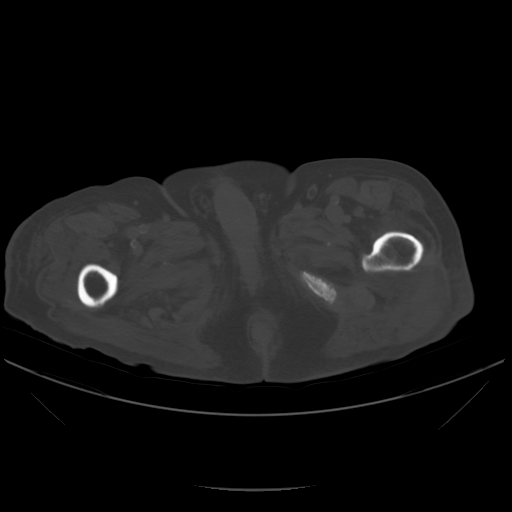
[im 15/133  soft-tissue]
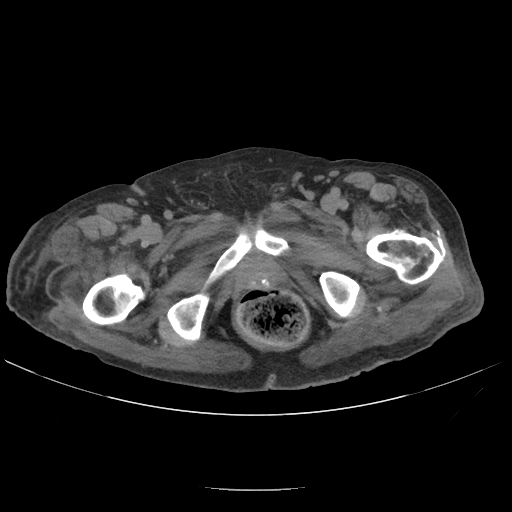
[im 23/133  soft-tissue]
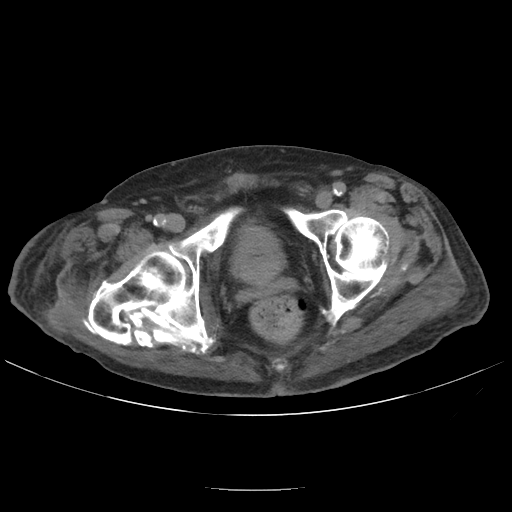
[im 30/133  soft-tissue]
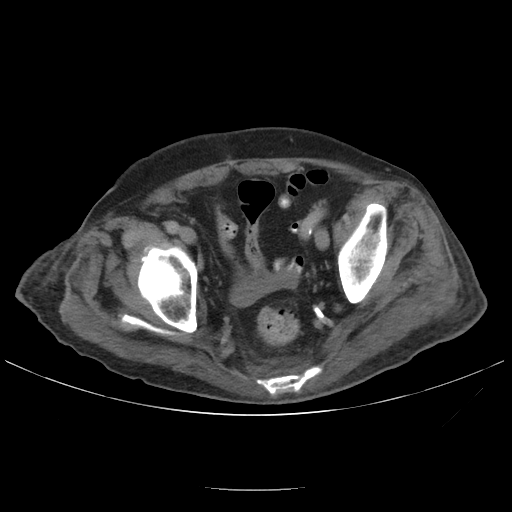
[im 45/133  soft-tissue]
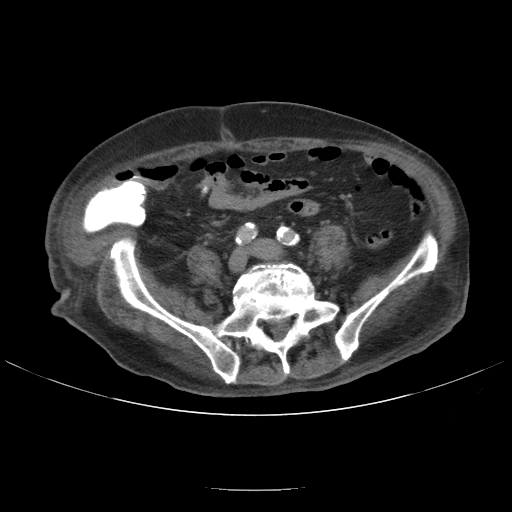
[im 52/133  soft-tissue]
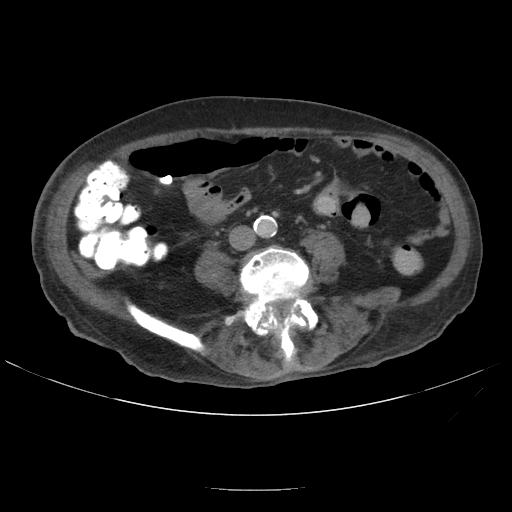
[im 59/133  soft-tissue]
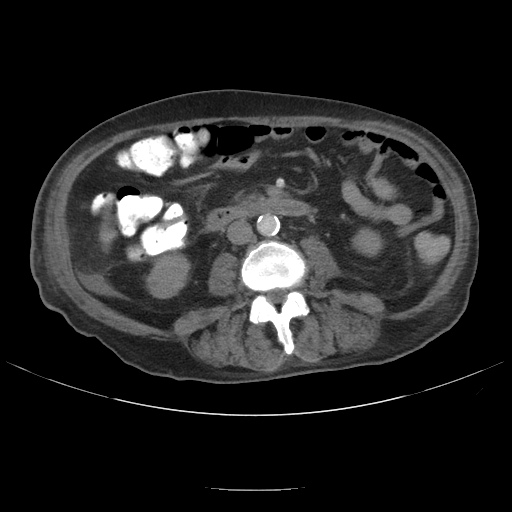
[im 67/133  soft-tissue]
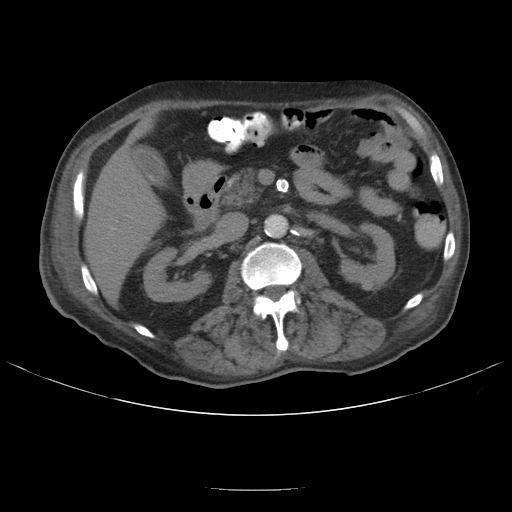
[im 74/133  soft-tissue]
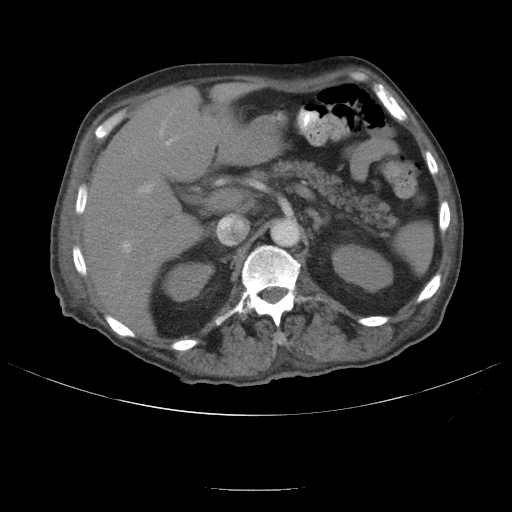
[im 74/133  bone]
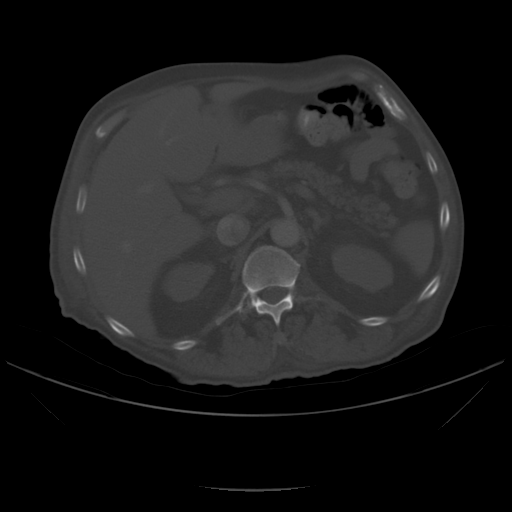
[im 81/133  soft-tissue]
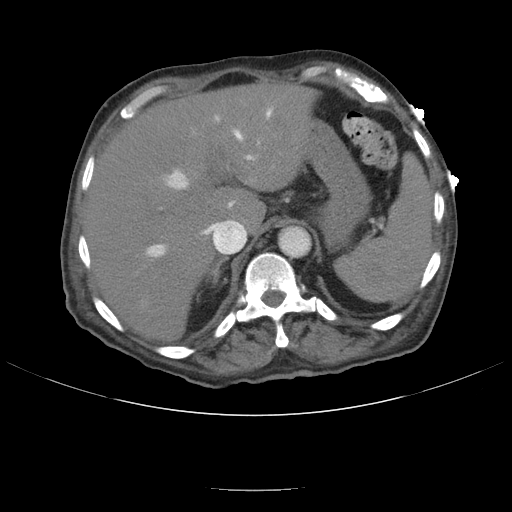
[im 89/133  soft-tissue]
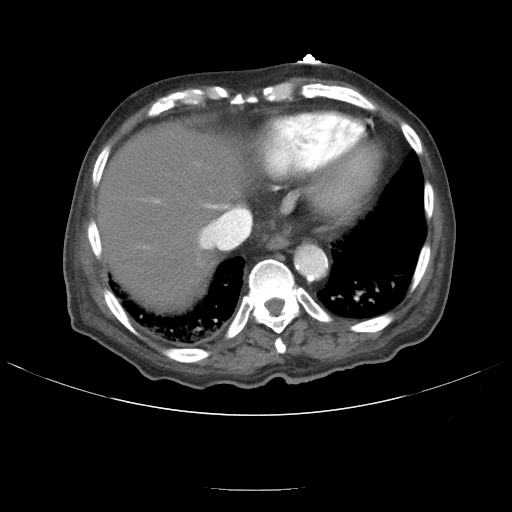
[im 103/133  soft-tissue]
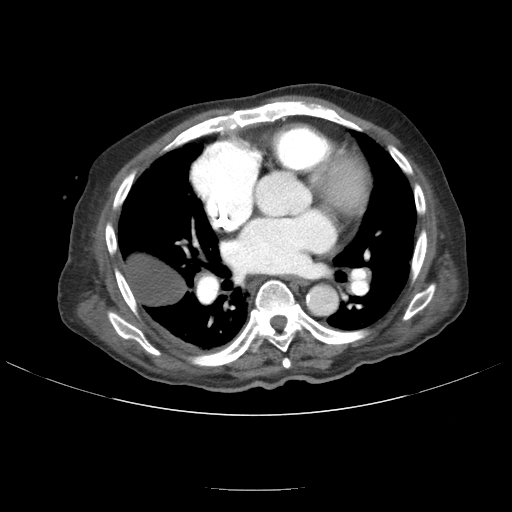
[im 111/133  soft-tissue]
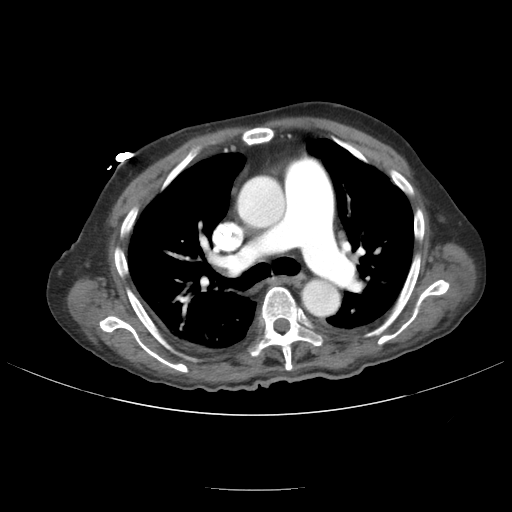
[im 118/133  soft-tissue]
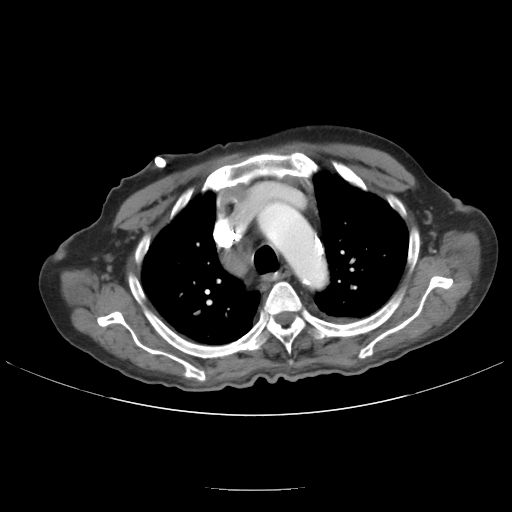
[im 125/133  soft-tissue]
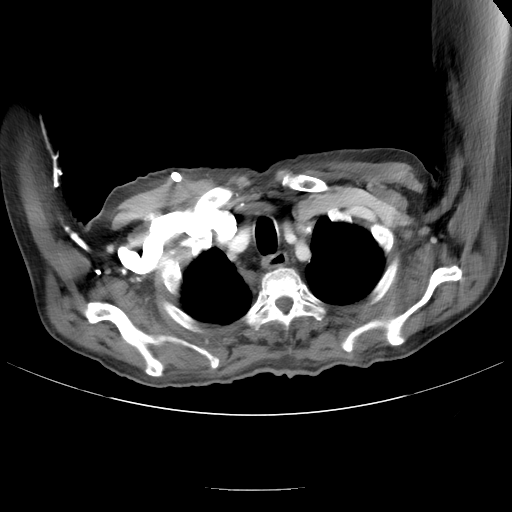

[15 of 46 positions shown; findings below may reference images not displayed]

FINDINGS: CT CHEST FINDINGS

Mediastinum/Nodes: No axillary or supraclavicular adenopathy. The
paratracheal adenopathy is present. Example 16 mm short axis RIGHT
lower paratracheal lymph node which is increased from 12 mm on
prior. RIGHT hilar lymph node measures 20 mm on image 25 series 201
increased from 18 mm. AP window node measures 9 mm compared to 11
mm. No central pulmonary embolism.

Borderline enlarged LEFT supraclavicular lymph node measures 11 mm
(image 5, series 201)

Cardiomegaly noted.

Lungs/Pleura: There is interlobular septal thickening in the lungs.
Extensive centrilobular emphysema. There is loculated fluid
collections along the RIGHT oblique fissure. No consolidation to
suggest active pulmonary infection.

Musculoskeletal: No aggressive osseous lesion.

CT ABDOMEN AND PELVIS FINDINGS

Hepatobiliary: There is reflux from the injection bolus into the
hepatic veins consistent with RIGHT heart insufficiency. No focal
hepatic lesion. Several gallstones collect within the decompressed
gallbladder. No biliary duct dilatation.

Pancreas: Pancreas is normal. No ductal dilatation. No pancreatic
inflammation.

Spleen: Low-attenuation lesion within the spleen is rounded
measuring 3.8 cm. This is new from 0112.

Adrenals/urinary tract: Adrenal glands are normal. Poor renal
cortical enhancement. High-density lesion measuring 8 mm in the
RIGHT renal cortex likely represents hemorrhagic cyst. Ureters and
bladder normal. The bladder is collapsed

Stomach/Bowel: Stomach, small bowel and cecum are normal. The
appendix is not identified. Small amount fluid inferior to the cecal
tip (image 99 of the axial series). Colon rectosigmoid colon are
normal. No evidence of colonic inflammation or infection

Vascular/Lymphatic: Abdominal aorta is normal caliber with
atherosclerotic calcification. There is no retroperitoneal or
periportal lymphadenopathy. No pelvic lymphadenopathy.

Reproductive: Prostate normal

Other: No abscess in the abdomen pelvis.

Musculoskeletal: No aggressive osseous lesion. Gas in the L4-L5 disc
space likely relates to degenerative change. There is significant
arthropathy at the facet joints at this level.
IMPRESSION: Chest Impression:

1. Progressive mediastinal and hilar lymphadenopathy from CT 0112.
This may relate to congestive heart failure. Cannot exclude
lymphoproliferative process such as CLL. No significant adenopathy
in the abdomen or axilla would favor against lymphoma.
2. Cardiomegaly and loculated pleural effusions with interlobular
septal thickening likely relates to congestive heart failure. RIGHT
heart insufficiency noted with contrast reflux into hepatic veins.
3. Centrilobular emphysema.
4. No evidence of acute pulmonary infection.

Abdomen / Pelvis Impression:

1. Large round lesion within the spleen is new from 0112. In patient
with fever of unknown origin cannot exclude a splenic abscess. No
evidence of enhancement with favor benign cyst. Consider MRI of the
abdomen with contrast. Of note, if patient cannot hold breath then
MRI may not be a suitable imaging modality. In that case, consider
FDG PET scan for evaluation if clinical suspicion warrants.
2. No evidence of abscess or inflammation the peritoneal space.
3. Small amount of free fluid along the RIGHT pericolic gutter
likely relates to heart failure described above.
4. Cholelithiasis without evidence acute cholecystitis.
